# Patient Record
Sex: Female | Born: 1935 | Race: White | Hispanic: No | Smoking: Former smoker
Health system: Southern US, Community
[De-identification: ages and names within clinical notes are randomized; demographics above are authoritative.]

## PROBLEM LIST (undated history)

## (undated) DIAGNOSIS — R58 Hemorrhage, not elsewhere classified: Secondary | ICD-10-CM

## (undated) DIAGNOSIS — M4850XA Collapsed vertebra, not elsewhere classified, site unspecified, initial encounter for fracture: Secondary | ICD-10-CM

## (undated) DIAGNOSIS — J302 Other seasonal allergic rhinitis: Secondary | ICD-10-CM

## (undated) DIAGNOSIS — J45909 Unspecified asthma, uncomplicated: Secondary | ICD-10-CM

## (undated) DIAGNOSIS — R234 Changes in skin texture: Secondary | ICD-10-CM

## (undated) DIAGNOSIS — K589 Irritable bowel syndrome without diarrhea: Secondary | ICD-10-CM

## (undated) DIAGNOSIS — J449 Chronic obstructive pulmonary disease, unspecified: Secondary | ICD-10-CM

## (undated) DIAGNOSIS — I10 Essential (primary) hypertension: Secondary | ICD-10-CM

## (undated) DIAGNOSIS — D649 Anemia, unspecified: Secondary | ICD-10-CM

## (undated) DIAGNOSIS — R918 Other nonspecific abnormal finding of lung field: Secondary | ICD-10-CM

## (undated) DIAGNOSIS — E039 Hypothyroidism, unspecified: Secondary | ICD-10-CM

## (undated) DIAGNOSIS — Z9889 Other specified postprocedural states: Secondary | ICD-10-CM

## (undated) DIAGNOSIS — R350 Frequency of micturition: Secondary | ICD-10-CM

## (undated) DIAGNOSIS — G609 Hereditary and idiopathic neuropathy, unspecified: Secondary | ICD-10-CM

## (undated) DIAGNOSIS — G620 Drug-induced polyneuropathy: Secondary | ICD-10-CM

## (undated) DIAGNOSIS — Z853 Personal history of malignant neoplasm of breast: Secondary | ICD-10-CM

## (undated) DIAGNOSIS — M797 Fibromyalgia: Secondary | ICD-10-CM

## (undated) DIAGNOSIS — IMO0002 Reserved for concepts with insufficient information to code with codable children: Secondary | ICD-10-CM

## (undated) DIAGNOSIS — R569 Unspecified convulsions: Secondary | ICD-10-CM

## (undated) DIAGNOSIS — T451X5A Adverse effect of antineoplastic and immunosuppressive drugs, initial encounter: Secondary | ICD-10-CM

## (undated) DIAGNOSIS — E785 Hyperlipidemia, unspecified: Secondary | ICD-10-CM

## (undated) DIAGNOSIS — C50919 Malignant neoplasm of unspecified site of unspecified female breast: Secondary | ICD-10-CM

## (undated) DIAGNOSIS — R351 Nocturia: Secondary | ICD-10-CM

## (undated) DIAGNOSIS — E119 Type 2 diabetes mellitus without complications: Secondary | ICD-10-CM

## (undated) DIAGNOSIS — R112 Nausea with vomiting, unspecified: Secondary | ICD-10-CM

## (undated) DIAGNOSIS — Z86718 Personal history of other venous thrombosis and embolism: Secondary | ICD-10-CM

## (undated) DIAGNOSIS — I509 Heart failure, unspecified: Secondary | ICD-10-CM

## (undated) DIAGNOSIS — Z86711 Personal history of pulmonary embolism: Secondary | ICD-10-CM

## (undated) DIAGNOSIS — M810 Age-related osteoporosis without current pathological fracture: Secondary | ICD-10-CM

## (undated) HISTORY — PX: TOTAL ABDOMINAL HYSTERECTOMY W/ BILATERAL SALPINGOOPHORECTOMY: SHX83

## (undated) HISTORY — DX: Hereditary and idiopathic neuropathy, unspecified: G60.9

## (undated) HISTORY — PX: TONSILLECTOMY: SUR1361

## (undated) HISTORY — DX: Chronic obstructive pulmonary disease, unspecified: J44.9

## (undated) HISTORY — DX: Unspecified asthma, uncomplicated: J45.909

## (undated) HISTORY — DX: Irritable bowel syndrome, unspecified: K58.9

## (undated) HISTORY — PX: OTHER SURGICAL HISTORY: SHX169

## (undated) HISTORY — PX: COLONOSCOPY: SHX174

## (undated) HISTORY — PX: FEMUR FRACTURE SURGERY: SHX633

## (undated) HISTORY — DX: Fibromyalgia: M79.7

## (undated) HISTORY — DX: Heart failure, unspecified: I50.9

## (undated) HISTORY — PX: CHOLECYSTECTOMY: SHX55

## (undated) HISTORY — DX: Essential (primary) hypertension: I10

## (undated) HISTORY — DX: Malignant neoplasm of unspecified site of unspecified female breast: C50.919

## (undated) HISTORY — PX: CATARACT EXTRACTION W/ INTRAOCULAR LENS  IMPLANT, BILATERAL: SHX1307

## (undated) HISTORY — DX: Type 2 diabetes mellitus without complications: E11.9

## (undated) HISTORY — PX: APPENDECTOMY: SHX54

---

## 1994-03-01 HISTORY — PX: BREAST LUMPECTOMY: SHX2

## 1998-11-24 ENCOUNTER — Emergency Department (HOSPITAL_COMMUNITY): Admission: EM | Admit: 1998-11-24 | Discharge: 1998-11-24 | Payer: Self-pay | Admitting: Emergency Medicine

## 1998-11-24 ENCOUNTER — Encounter: Payer: Self-pay | Admitting: Emergency Medicine

## 2000-04-12 ENCOUNTER — Encounter: Admission: RE | Admit: 2000-04-12 | Discharge: 2000-04-12 | Payer: Self-pay | Admitting: Surgery

## 2000-04-12 ENCOUNTER — Encounter: Payer: Self-pay | Admitting: Surgery

## 2001-07-26 ENCOUNTER — Encounter: Admission: RE | Admit: 2001-07-26 | Discharge: 2001-07-26 | Payer: Self-pay | Admitting: Surgery

## 2001-07-26 ENCOUNTER — Encounter: Payer: Self-pay | Admitting: Surgery

## 2002-08-15 ENCOUNTER — Encounter: Payer: Self-pay | Admitting: Surgery

## 2002-08-15 ENCOUNTER — Encounter: Admission: RE | Admit: 2002-08-15 | Discharge: 2002-08-15 | Payer: Self-pay | Admitting: Surgery

## 2003-02-14 ENCOUNTER — Encounter (INDEPENDENT_AMBULATORY_CARE_PROVIDER_SITE_OTHER): Payer: Self-pay | Admitting: *Deleted

## 2003-02-14 ENCOUNTER — Ambulatory Visit (HOSPITAL_COMMUNITY): Admission: RE | Admit: 2003-02-14 | Discharge: 2003-02-14 | Payer: Self-pay | Admitting: Gastroenterology

## 2003-04-10 ENCOUNTER — Emergency Department (HOSPITAL_COMMUNITY): Admission: EM | Admit: 2003-04-10 | Discharge: 2003-04-10 | Payer: Self-pay | Admitting: Emergency Medicine

## 2003-07-11 ENCOUNTER — Ambulatory Visit (HOSPITAL_COMMUNITY): Admission: RE | Admit: 2003-07-11 | Discharge: 2003-07-11 | Payer: Self-pay | Admitting: Internal Medicine

## 2003-10-21 ENCOUNTER — Encounter: Admission: RE | Admit: 2003-10-21 | Discharge: 2003-10-21 | Payer: Self-pay | Admitting: Surgery

## 2003-11-14 ENCOUNTER — Emergency Department (HOSPITAL_COMMUNITY): Admission: EM | Admit: 2003-11-14 | Discharge: 2003-11-14 | Payer: Self-pay | Admitting: Emergency Medicine

## 2004-01-08 ENCOUNTER — Ambulatory Visit (HOSPITAL_COMMUNITY): Admission: RE | Admit: 2004-01-08 | Discharge: 2004-01-08 | Payer: Self-pay | Admitting: Neurological Surgery

## 2004-01-14 ENCOUNTER — Inpatient Hospital Stay (HOSPITAL_COMMUNITY): Admission: AD | Admit: 2004-01-14 | Discharge: 2004-01-16 | Payer: Self-pay | Admitting: Neurological Surgery

## 2004-01-14 HISTORY — PX: OTHER SURGICAL HISTORY: SHX169

## 2004-02-11 ENCOUNTER — Ambulatory Visit: Payer: Self-pay | Admitting: Cardiology

## 2004-02-21 ENCOUNTER — Ambulatory Visit: Payer: Self-pay

## 2004-03-10 ENCOUNTER — Ambulatory Visit: Payer: Self-pay | Admitting: Internal Medicine

## 2004-11-17 ENCOUNTER — Encounter: Admission: RE | Admit: 2004-11-17 | Discharge: 2004-11-17 | Payer: Self-pay | Admitting: Endocrinology

## 2005-01-26 ENCOUNTER — Other Ambulatory Visit: Admission: RE | Admit: 2005-01-26 | Discharge: 2005-01-26 | Payer: Self-pay | Admitting: Obstetrics and Gynecology

## 2005-03-04 ENCOUNTER — Ambulatory Visit: Payer: Self-pay | Admitting: Internal Medicine

## 2005-03-31 ENCOUNTER — Ambulatory Visit: Payer: Self-pay | Admitting: Internal Medicine

## 2005-06-08 HISTORY — PX: OTHER SURGICAL HISTORY: SHX169

## 2005-06-09 ENCOUNTER — Inpatient Hospital Stay (HOSPITAL_COMMUNITY): Admission: RE | Admit: 2005-06-09 | Discharge: 2005-06-10 | Payer: Self-pay | Admitting: Obstetrics and Gynecology

## 2005-10-08 ENCOUNTER — Encounter: Admission: RE | Admit: 2005-10-08 | Discharge: 2005-10-08 | Payer: Self-pay | Admitting: Gastroenterology

## 2005-11-18 ENCOUNTER — Encounter: Admission: RE | Admit: 2005-11-18 | Discharge: 2005-11-18 | Payer: Self-pay | Admitting: Endocrinology

## 2005-11-29 ENCOUNTER — Emergency Department (HOSPITAL_COMMUNITY): Admission: EM | Admit: 2005-11-29 | Discharge: 2005-11-30 | Payer: Self-pay | Admitting: Emergency Medicine

## 2006-12-01 ENCOUNTER — Encounter: Admission: RE | Admit: 2006-12-01 | Discharge: 2006-12-01 | Payer: Self-pay | Admitting: Endocrinology

## 2007-12-07 ENCOUNTER — Encounter: Admission: RE | Admit: 2007-12-07 | Discharge: 2007-12-07 | Payer: Self-pay | Admitting: Endocrinology

## 2008-12-09 ENCOUNTER — Encounter: Admission: RE | Admit: 2008-12-09 | Discharge: 2008-12-09 | Payer: Self-pay | Admitting: Endocrinology

## 2009-12-11 ENCOUNTER — Encounter: Admission: RE | Admit: 2009-12-11 | Discharge: 2009-12-11 | Payer: Self-pay | Admitting: Endocrinology

## 2009-12-19 ENCOUNTER — Encounter: Admission: RE | Admit: 2009-12-19 | Discharge: 2009-12-19 | Payer: Self-pay | Admitting: Endocrinology

## 2009-12-29 ENCOUNTER — Encounter: Admission: RE | Admit: 2009-12-29 | Discharge: 2009-12-29 | Payer: Self-pay | Admitting: Endocrinology

## 2010-01-01 ENCOUNTER — Ambulatory Visit: Payer: Self-pay | Admitting: Genetic Counselor

## 2010-01-01 ENCOUNTER — Ambulatory Visit: Payer: Self-pay | Admitting: Oncology

## 2010-01-05 ENCOUNTER — Encounter: Payer: Self-pay | Admitting: Pulmonary Disease

## 2010-01-05 LAB — CBC WITH DIFFERENTIAL/PLATELET
Basophils Absolute: 0 10*3/uL (ref 0.0–0.1)
EOS%: 1.7 % (ref 0.0–7.0)
HCT: 42 % (ref 34.8–46.6)
HGB: 14.3 g/dL (ref 11.6–15.9)
LYMPH%: 23.3 % (ref 14.0–49.7)
MCH: 31.6 pg (ref 25.1–34.0)
MCV: 93 fL (ref 79.5–101.0)
NEUT%: 67.4 % (ref 38.4–76.8)
Platelets: 203 10*3/uL (ref 145–400)
lymph#: 1.7 10*3/uL (ref 0.9–3.3)

## 2010-01-05 LAB — COMPREHENSIVE METABOLIC PANEL
AST: 19 U/L (ref 0–37)
BUN: 15 mg/dL (ref 6–23)
Calcium: 9.1 mg/dL (ref 8.4–10.5)
Chloride: 104 mEq/L (ref 96–112)
Creatinine, Ser: 0.72 mg/dL (ref 0.40–1.20)

## 2010-01-05 LAB — LACTATE DEHYDROGENASE: LDH: 195 U/L (ref 94–250)

## 2010-01-20 ENCOUNTER — Encounter: Payer: Self-pay | Admitting: Pulmonary Disease

## 2010-01-20 ENCOUNTER — Ambulatory Visit (HOSPITAL_COMMUNITY)
Admission: RE | Admit: 2010-01-20 | Discharge: 2010-01-20 | Payer: Self-pay | Source: Home / Self Care | Admitting: Oncology

## 2010-01-23 ENCOUNTER — Ambulatory Visit: Payer: Self-pay | Admitting: Pulmonary Disease

## 2010-01-23 DIAGNOSIS — J45909 Unspecified asthma, uncomplicated: Secondary | ICD-10-CM | POA: Insufficient documentation

## 2010-01-23 DIAGNOSIS — G4733 Obstructive sleep apnea (adult) (pediatric): Secondary | ICD-10-CM

## 2010-01-23 DIAGNOSIS — E785 Hyperlipidemia, unspecified: Secondary | ICD-10-CM | POA: Insufficient documentation

## 2010-01-23 DIAGNOSIS — R222 Localized swelling, mass and lump, trunk: Secondary | ICD-10-CM

## 2010-01-26 ENCOUNTER — Encounter: Payer: Self-pay | Admitting: Pulmonary Disease

## 2010-01-27 ENCOUNTER — Ambulatory Visit: Payer: Self-pay | Admitting: Cardiology

## 2010-01-29 ENCOUNTER — Ambulatory Visit (HOSPITAL_COMMUNITY)
Admission: RE | Admit: 2010-01-29 | Discharge: 2010-01-29 | Payer: Self-pay | Source: Home / Self Care | Admitting: Pulmonary Disease

## 2010-01-29 HISTORY — PX: BRONCHOSCOPY: SUR163

## 2010-01-30 ENCOUNTER — Telehealth: Payer: Self-pay | Admitting: Pulmonary Disease

## 2010-02-02 ENCOUNTER — Encounter: Payer: Self-pay | Admitting: Pulmonary Disease

## 2010-02-05 ENCOUNTER — Ambulatory Visit: Payer: Self-pay | Admitting: Genetic Counselor

## 2010-02-10 ENCOUNTER — Encounter: Payer: Self-pay | Admitting: Pulmonary Disease

## 2010-02-10 ENCOUNTER — Ambulatory Visit (HOSPITAL_COMMUNITY)
Admission: RE | Admit: 2010-02-10 | Discharge: 2010-02-10 | Payer: Self-pay | Source: Home / Self Care | Attending: Pulmonary Disease | Admitting: Pulmonary Disease

## 2010-02-11 ENCOUNTER — Ambulatory Visit: Payer: Self-pay | Admitting: Oncology

## 2010-02-17 ENCOUNTER — Encounter
Admission: RE | Admit: 2010-02-17 | Discharge: 2010-02-17 | Payer: Self-pay | Source: Home / Self Care | Attending: Surgery | Admitting: Surgery

## 2010-02-18 ENCOUNTER — Ambulatory Visit
Admission: RE | Admit: 2010-02-18 | Discharge: 2010-02-18 | Payer: Self-pay | Source: Home / Self Care | Attending: Surgery | Admitting: Surgery

## 2010-02-18 HISTORY — PX: OTHER SURGICAL HISTORY: SHX169

## 2010-02-25 ENCOUNTER — Encounter: Payer: Self-pay | Admitting: Oncology

## 2010-02-25 ENCOUNTER — Ambulatory Visit
Admission: RE | Admit: 2010-02-25 | Discharge: 2010-02-25 | Payer: Self-pay | Source: Home / Self Care | Attending: Oncology | Admitting: Oncology

## 2010-02-25 HISTORY — PX: TRANSTHORACIC ECHOCARDIOGRAM: SHX275

## 2010-03-01 HISTORY — PX: BREAST LUMPECTOMY: SHX2

## 2010-03-09 ENCOUNTER — Ambulatory Visit (HOSPITAL_COMMUNITY)
Admission: RE | Admit: 2010-03-09 | Discharge: 2010-03-09 | Payer: Self-pay | Source: Home / Self Care | Attending: Oncology | Admitting: Oncology

## 2010-03-11 ENCOUNTER — Ambulatory Visit
Admission: RE | Admit: 2010-03-11 | Discharge: 2010-03-11 | Payer: Self-pay | Source: Home / Self Care | Attending: Pulmonary Disease | Admitting: Pulmonary Disease

## 2010-03-12 ENCOUNTER — Encounter: Payer: Self-pay | Admitting: Cardiology

## 2010-03-13 ENCOUNTER — Ambulatory Visit: Payer: Self-pay | Admitting: Oncology

## 2010-03-18 ENCOUNTER — Encounter: Payer: Self-pay | Admitting: Cardiology

## 2010-03-18 ENCOUNTER — Ambulatory Visit: Admit: 2010-03-18 | Payer: Self-pay | Admitting: Pulmonary Disease

## 2010-03-18 LAB — BASIC METABOLIC PANEL
BUN: 18 mg/dL (ref 6–23)
CO2: 31 mEq/L (ref 19–32)
Calcium: 9.3 mg/dL (ref 8.4–10.5)
Chloride: 94 mEq/L — ABNORMAL LOW (ref 96–112)
Creatinine, Ser: 0.84 mg/dL (ref 0.40–1.20)
Glucose, Bld: 104 mg/dL — ABNORMAL HIGH (ref 70–99)
Potassium: 3.1 mEq/L — ABNORMAL LOW (ref 3.5–5.3)
Sodium: 136 mEq/L (ref 135–145)

## 2010-03-18 LAB — CBC WITH DIFFERENTIAL/PLATELET
BASO%: 0.9 % (ref 0.0–2.0)
Basophils Absolute: 0 10*3/uL (ref 0.0–0.1)
EOS%: 2.2 % (ref 0.0–7.0)
Eosinophils Absolute: 0 10*3/uL (ref 0.0–0.5)
HCT: 41.4 % (ref 34.8–46.6)
HGB: 14.3 g/dL (ref 11.6–15.9)
LYMPH%: 83.5 % — ABNORMAL HIGH (ref 14.0–49.7)
MCH: 31.4 pg (ref 25.1–34.0)
MCHC: 34.6 g/dL (ref 31.5–36.0)
MCV: 90.9 fL (ref 79.5–101.0)
MONO#: 0 10*3/uL — ABNORMAL LOW (ref 0.1–0.9)
MONO%: 2.1 % (ref 0.0–14.0)
NEUT#: 0.1 10*3/uL — CL (ref 1.5–6.5)
NEUT%: 11.3 % — ABNORMAL LOW (ref 38.4–76.8)
Platelets: 115 10*3/uL — ABNORMAL LOW (ref 145–400)
RBC: 4.55 10*6/uL (ref 3.70–5.45)
RDW: 12.2 % (ref 11.2–14.5)
WBC: 0.7 10*3/uL — CL (ref 3.9–10.3)
lymph#: 0.6 10*3/uL — ABNORMAL LOW (ref 0.9–3.3)

## 2010-03-19 DIAGNOSIS — R05 Cough: Secondary | ICD-10-CM | POA: Insufficient documentation

## 2010-03-25 ENCOUNTER — Ambulatory Visit
Admission: RE | Admit: 2010-03-25 | Discharge: 2010-03-25 | Payer: Self-pay | Source: Home / Self Care | Attending: Pulmonary Disease | Admitting: Pulmonary Disease

## 2010-03-25 ENCOUNTER — Encounter: Payer: Self-pay | Admitting: Pulmonary Disease

## 2010-03-26 ENCOUNTER — Encounter: Payer: Self-pay | Admitting: Cardiology

## 2010-03-26 LAB — CBC WITH DIFFERENTIAL/PLATELET
BASO%: 0.5 % (ref 0.0–2.0)
LYMPH%: 16.8 % (ref 14.0–49.7)
MCH: 30.7 pg (ref 25.1–34.0)
MCHC: 35.1 g/dL (ref 31.5–36.0)
MCV: 87.4 fL (ref 79.5–101.0)
MONO%: 12.1 % (ref 0.0–14.0)
Platelets: 187 10*3/uL (ref 145–400)
RBC: 4.76 10*6/uL (ref 3.70–5.45)
nRBC: 0 % (ref 0–0)

## 2010-03-26 LAB — COMPREHENSIVE METABOLIC PANEL
ALT: 19 U/L (ref 0–35)
AST: 24 U/L (ref 0–37)
Creatinine, Ser: 0.79 mg/dL (ref 0.40–1.20)
Total Bilirubin: 0.3 mg/dL (ref 0.3–1.2)

## 2010-03-31 NOTE — Progress Notes (Signed)
Summary: pain in chest, prod cough   Phone Note Call from Patient Call back at Home Phone [phone]   Caller: Patient Call For: [facility] Reason for Call: Talk to Nurse Summary of Call: Patient had a bronchoscopy yesterday.  Patient calling saying she has pain in her chest and throat is sore.  She is asking for a light pain med, she has been taking tylenol with no relief.  [facility] Initial call taken by: Lehman Prom,  January 30, 2010 10:26 AM [facility]er: [facility]  [phone]*  Follow-up for Phone Call        Spoke with pt and she states she had a bronch yesterday and has been using tylenol for pain but it is not helping. She states her chest is sore. She denies any increased SOB, no hemoptysis, just soreness. Pt also states her throat is sore as well. Pt is requesting an rx for the pain . Please advsie.Carron Curie CMA  January 30, 2010 11:37 AM   Additional Follow-up for Phone Call Additional follow up Details #1::        pt did not have ptx on cxr  let her know that it is common to have a sore throat afterwards, but not necessarily a sore chest. as long as she isn't having sob, would use advil 200mg  2-3 up to every 8 hrs if needed for pain.  Let her know her biopsies are still pending.  Likely to be ready monday Additional Follow-up by: Barbaraann Share MD,  January 30, 2010 1:43 PM    Additional Follow-up for Phone Call Additional follow up Details #2::    Called, spoke with pt.  She was informed of above per [facility] and verbalized understanding.  She would also like me to inform KC that the soreness in chest is more like a pain and is worse when coughing and upon inspiration.  Also states starting coughing up mucus today.  Mucus is yellow with a small amount of dark red streaks.  Pt denies SOB and fever.  Will forward to [facility].   Gweneth Dimitri RN  January 30, 2010 2:24 PM   Additional Follow-up for Phone Call Additional follow up Details #3:: Details  for Additional Follow-up Action Taken: not uncommon to have streaks of blood after doing biopsy.  can call in levaquin 750mg  one each day for 5 days.  Use advil as directed for discomfort.  If her symptoms continue, will need to have followup cxr.  If she gets sob, will need to have cxr then. Additional Follow-up by: Barbaraann Share MD,  January 30, 2010 3:25 PM  New/Updated Medications: LEVAQUIN 750 MG TABS (LEVOFLOXACIN) Take 1 tablet by mouth once a day Prescriptions: LEVAQUIN 750 MG TABS (LEVOFLOXACIN) Take 1 tablet by mouth once a day  #5 x 0   Entered by:   Carron Curie CMA   Authorized by:   Barbaraann Share MD   Signed by:   Carron Curie CMA on 01/30/2010   Method used:   [facility]        [phone] (retail)       [address] [place], [place]  [address]       Ph: [phone] or [phone]       Fax: [phone]   RxID:   [id number]  rx sent. pt aware of recs.Carron Curie CMA  January 30, 2010 3:34  PM

## 2010-03-31 NOTE — Assessment & Plan Note (Signed)
Summary: consult for abnormal ct chest   Visit Type:  Initial Consult Copy to:  Pierce Crane MD Primary Provider/Referring Provider:  Laurene Footman MD  CC:  Pulmonary consult. pt here to evaluate lung mass.  History of Present Illness: The pt is a very pleasant 76y/o female who I have been asked to see for an abnormal chest ct.  She was diagnosed with right breast cancer this month, and ct chest revealed small LN except for a 13mm right hilar node, and multiple GGO's with the largest being 3cm in the RLL.  She subsequently underwent PET scanning, and this showed the right hilar LN to have minimal hypermetabolic activity, but the GGO in the RLL had moderate uptake at 5.4 SUV.  The pt has a chronic cough that is primarily dry, but can bring up clear mucus first thing in the am.  She denies any purulent mucus, or history suggestive of recent pulmonary infection.  She reports no chest pain or hemoptysis, but did have a "speck" of blood in her mucus in Mar of this year.  The pt has been eating well, and denies any weight loss.  She does have a h/o smoking 1ppd for 30+ years, but has not smoked in 54yrs.    Preventive Screening-Counseling & Management  Alcohol-Tobacco     Smoking Status: quit  Current Medications (verified): 1)  Vytorin 10-80 Mg Tabs (Ezetimibe-Simvastatin) .... Once Daily 2)  Fluoxetine Hcl 40 Mg Caps (Fluoxetine Hcl) .... Once Daily 3)  Hydrochlorothiazide 25 Mg Tabs (Hydrochlorothiazide) .... Once Daily 4)  Synthroid 88 Mcg Tabs (Levothyroxine Sodium) .... Once Daily 5)  Vitamin D (Ergocalciferol) 50000 Unit Caps (Ergocalciferol) .... Once A Week 6)  Zolpidem Tartrate 10 Mg Tabs (Zolpidem Tartrate) .... Once Daily 7)  Fosamax Plus D 70-2800 Mg-Unit Tabs (Alendronate-Cholecalciferol) .... Once A Week 8)  Dexilant 60 Mg Cpdr (Dexlansoprazole) .... Once Daily 9)  Colestipol ( ? Strength) .Marland Kitchen.. 1 Two Times A Day  Allergies (verified): 1)  ! Pcn  Past History:  Past Medical  History: IBS fibromyalgia left breast cancer 1996....lumpectomy, xrt right breast cancer dxed 12/2009. OBSTRUCTIVE SLEEP APNEA (ICD-327.23) HYPERLIPIDEMIA (ICD-272.4) ASTHMA (ICD-493.90)    Past Surgical History: hysterectomy oophorectomy cholecystectomy appendectomy back surgery left breast lumpectomy  Family History: Reviewed history and no changes required. emphysema--father asthma--father ovarian cancer--mother, sister, maternal aunt breast cancer: mgm  Social History: Reviewed history and no changes required. 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 started age 76. quit 1980's.  Married Smoking Status:  quit  Review of Systems       The patient complains of shortness of breath with activity, shortness of breath at rest, acid heartburn, indigestion, anxiety, depression, hand/feet swelling, and joint stiffness or pain.  The patient denies productive cough, non-productive cough, coughing up blood, chest pain, irregular heartbeats, loss of appetite, weight change, abdominal pain, difficulty swallowing, sore throat, tooth/dental problems, headaches, nasal congestion/difficulty breathing through nose, sneezing, itching, ear ache, rash, change in color of mucus, and fever.    Vital Signs:  Patient profile:   76 year old female Height:      67 inches Weight:      180.38 pounds BMI:     28.35 O2 Sat:      96 % on Room air Temp:     98.4 degrees F oral Pulse rate:   69 / minute BP sitting:   154 / 84  (right arm) Cuff size:   large  Vitals Entered By: Hali Marry  Silva (January 23, 2010 10:22 AM)  O2 Flow:  Room air CC: Pulmonary consult. pt here to evaluate lung mass Comments meds and allergies updated Phone number updated Carver Fila  January 23, 2010 10:23 AM    Physical Exam  General:  ow female in nad Eyes:  PERRLA and EOMI.   Nose:  patent without discharge Mouth:  clear, no exudates or lesions seen. Neck:  no jvd, tmg, LN Lungs:   clear to auscultation, no wheezing or rhonchi Heart:  rrr, no mrg Abdomen:  soft and nontender, bs+ Extremities:  no edema or cyanosis  pulses intact distally Neurologic:  alert and oriented, moves all 4.   Impression & Recommendations:  Problem # 1:  MASS, LUNG (ICD-786.6) the pt has multiple GGO's bilat on ct chest, with the largest being in the RLL and associated with moderate hypermetabolic activity on PET.  I think this is very unlikely to be infectious with no history to support this, although I cannot r/o something such as MAC.  It could be inflammatory, but the appearance of the dominant lesion in the RLL is concerning.  Her treatment course may be significantly altered by the findings (met. breast vs BAC?).  After a long discussion with the pt and her family, the decision was made to pursue a diagnosis.  The lesion is deep in the lung, and needle biopsy would likely result in a ptx.  I think this can be approached safely with ENB, and have discussed the procedure with the patient.  She is agreeable.  Medications Added to Medication List This Visit: 1)  Vytorin 10-80 Mg Tabs (Ezetimibe-simvastatin) .... Once daily 2)  Fluoxetine Hcl 40 Mg Caps (Fluoxetine hcl) .... Once daily 3)  Hydrochlorothiazide 25 Mg Tabs (Hydrochlorothiazide) .... Once daily 4)  Synthroid 88 Mcg Tabs (Levothyroxine sodium) .... Once daily 5)  Vitamin D (ergocalciferol) 50000 Unit Caps (Ergocalciferol) .... Once a week 6)  Zolpidem Tartrate 10 Mg Tabs (Zolpidem tartrate) .... Once daily 7)  Fosamax Plus D 70-2800 Mg-unit Tabs (Alendronate-cholecalciferol) .... Once a week 8)  Dexilant 60 Mg Cpdr (Dexlansoprazole) .... Once daily 9)  Colestipol ( ? Strength)  .Marland Kitchen.. 1 two times a day  Other Orders: Consultation Level IV (16109)  Patient Instructions: 1)  will schedule for special bronchoscopy to biopsy this area and possibly your lymph nodes.  Will call you once this is done.    Immunization  History:  Influenza Immunization History:    Influenza:  historical (10/30/2009)

## 2010-03-31 NOTE — Miscellaneous (Signed)
Summary: Orders Update   Clinical Lists Changes  Orders: Added new Referral order of Radiology Referral (Radiology) - Signed 

## 2010-03-31 NOTE — Miscellaneous (Signed)
Summary: Orders Update  Clinical Lists Changes  Orders: Added new Referral order of Radiology Referral (Radiology) - Signed Added new Referral order of Misc. Referral (Misc. Ref) - Signed 

## 2010-04-01 ENCOUNTER — Inpatient Hospital Stay (HOSPITAL_COMMUNITY)
Admission: EM | Admit: 2010-04-01 | Discharge: 2010-04-08 | DRG: 312 | Disposition: A | Discharge status: Home / Self Care | Payer: Medicare Other | Attending: Internal Medicine | Admitting: Internal Medicine

## 2010-04-01 ENCOUNTER — Emergency Department (HOSPITAL_COMMUNITY): Payer: Medicare Other

## 2010-04-01 DIAGNOSIS — J4489 Other specified chronic obstructive pulmonary disease: Secondary | ICD-10-CM | POA: Diagnosis present

## 2010-04-01 DIAGNOSIS — R5081 Fever presenting with conditions classified elsewhere: Secondary | ICD-10-CM | POA: Diagnosis present

## 2010-04-01 DIAGNOSIS — IMO0001 Reserved for inherently not codable concepts without codable children: Secondary | ICD-10-CM | POA: Diagnosis present

## 2010-04-01 DIAGNOSIS — T451X5A Adverse effect of antineoplastic and immunosuppressive drugs, initial encounter: Secondary | ICD-10-CM | POA: Diagnosis present

## 2010-04-01 DIAGNOSIS — K121 Other forms of stomatitis: Secondary | ICD-10-CM | POA: Diagnosis present

## 2010-04-01 DIAGNOSIS — J449 Chronic obstructive pulmonary disease, unspecified: Secondary | ICD-10-CM | POA: Diagnosis present

## 2010-04-01 DIAGNOSIS — G4733 Obstructive sleep apnea (adult) (pediatric): Secondary | ICD-10-CM | POA: Diagnosis present

## 2010-04-01 DIAGNOSIS — R197 Diarrhea, unspecified: Secondary | ICD-10-CM | POA: Diagnosis present

## 2010-04-01 DIAGNOSIS — D709 Neutropenia, unspecified: Secondary | ICD-10-CM | POA: Diagnosis present

## 2010-04-01 DIAGNOSIS — I951 Orthostatic hypotension: Principal | ICD-10-CM | POA: Diagnosis present

## 2010-04-01 DIAGNOSIS — E039 Hypothyroidism, unspecified: Secondary | ICD-10-CM | POA: Diagnosis present

## 2010-04-01 DIAGNOSIS — D6959 Other secondary thrombocytopenia: Secondary | ICD-10-CM | POA: Diagnosis present

## 2010-04-01 DIAGNOSIS — A088 Other specified intestinal infections: Secondary | ICD-10-CM | POA: Diagnosis present

## 2010-04-01 DIAGNOSIS — E876 Hypokalemia: Secondary | ICD-10-CM | POA: Diagnosis present

## 2010-04-01 DIAGNOSIS — C50919 Malignant neoplasm of unspecified site of unspecified female breast: Secondary | ICD-10-CM | POA: Diagnosis present

## 2010-04-01 LAB — DIFFERENTIAL
Band Neutrophils: 0 % (ref 0–10)
Basophils Absolute: 0 10*3/uL (ref 0.0–0.1)
Eosinophils Relative: 0 % (ref 0–5)
Lymphocytes Relative: 0 % — ABNORMAL LOW (ref 12–46)
Monocytes Relative: 0 % — ABNORMAL LOW (ref 3–12)
nRBC: 0 /100 WBC

## 2010-04-01 LAB — COMPREHENSIVE METABOLIC PANEL
ALT: 58 U/L — ABNORMAL HIGH (ref 0–35)
AST: 37 U/L (ref 0–37)
Alkaline Phosphatase: 75 U/L (ref 39–117)
CO2: 28 mEq/L (ref 19–32)
Calcium: 9.7 mg/dL (ref 8.4–10.5)
Chloride: 95 mEq/L — ABNORMAL LOW (ref 96–112)
GFR calc non Af Amer: 60 mL/min — ABNORMAL LOW (ref >60)
Glucose, Bld: 110 mg/dL — ABNORMAL HIGH (ref 70–99)
Potassium: 4.2 mEq/L (ref 3.5–5.1)
Sodium: 134 mEq/L — ABNORMAL LOW (ref 135–145)
Total Bilirubin: 0.9 mg/dL (ref 0.3–1.2)

## 2010-04-01 LAB — URINALYSIS, ROUTINE W REFLEX MICROSCOPIC
Bilirubin Urine: NEGATIVE
Ketones, ur: NEGATIVE mg/dL
Nitrite: NEGATIVE
Protein, ur: NEGATIVE mg/dL

## 2010-04-01 LAB — POCT CARDIAC MARKERS: Troponin i, poc: <0.05 ng/mL (ref 0.00–0.09)

## 2010-04-01 LAB — CBC
MCH: 30.2 pg (ref 26.0–34.0)
Platelets: 160 10*3/uL (ref 150–400)
RBC: 4.63 MIL/uL (ref 3.87–5.11)
RDW: 12.2 % (ref 11.5–15.5)

## 2010-04-02 DIAGNOSIS — C50919 Malignant neoplasm of unspecified site of unspecified female breast: Secondary | ICD-10-CM

## 2010-04-02 LAB — CBC
MCH: 30.6 pg (ref 26.0–34.0)
MCV: 87.5 fL (ref 78.0–100.0)
Platelets: 124 10*3/uL — ABNORMAL LOW (ref 150–400)
RBC: 3.92 MIL/uL (ref 3.87–5.11)
RDW: 12.4 % (ref 11.5–15.5)
WBC: 0.5 10*3/uL — CL (ref 4.0–10.5)

## 2010-04-02 LAB — COMPREHENSIVE METABOLIC PANEL
ALT: 42 U/L — ABNORMAL HIGH (ref 0–35)
Albumin: 2.8 g/dL — ABNORMAL LOW (ref 3.5–5.2)
Alkaline Phosphatase: 60 U/L (ref 39–117)
BUN: 15 mg/dL (ref 6–23)
Chloride: 99 mEq/L (ref 96–112)
Glucose, Bld: 88 mg/dL (ref 70–99)
Potassium: 3.1 mEq/L — ABNORMAL LOW (ref 3.5–5.1)
Sodium: 136 mEq/L (ref 135–145)
Total Bilirubin: 0.6 mg/dL (ref 0.3–1.2)

## 2010-04-02 LAB — PATHOLOGIST SMEAR REVIEW

## 2010-04-02 NOTE — Assessment & Plan Note (Signed)
Summary: acute sick visit for cough   Copy to:  Pierce Crane MD Primary Provider/Referring Provider:  Laurene Footman MD  CC:  Acute visit. pt c/o dry cough and wheezing x couple months. pt states she had a nose bleed this morning and spit up a clot of blood.  History of Present Illness: the pt comes in today for an acute sick visit related to a cough.  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  This has been reviewed by Dr. Donnie Coffin, and he is now following.  She currently c/o a dry hacking cough and upper airway pseudowheezing for the last few months.  She has had postnasal drip, and most recently epistaxis.  She has seen this am scant mucus on coughing with small clot of blood.  She denies chest congestion or purulence.  She is not more sob than usual.  She denies reflux symptoms.  Current Medications (verified): 1)  Vytorin 10-80 Mg Tabs (Ezetimibe-Simvastatin) .... Once Daily 2)  Fluoxetine Hcl 40 Mg Caps (Fluoxetine Hcl) .... Once Daily 3)  Hydrochlorothiazide 25 Mg Tabs (Hydrochlorothiazide) .... Once Daily 4)  Synthroid 88 Mcg Tabs (Levothyroxine Sodium) .... Once Daily 5)  Vitamin D (Ergocalciferol) 50000 Unit Caps (Ergocalciferol) .... Once A Week 6)  Zolpidem Tartrate 10 Mg Tabs (Zolpidem Tartrate) .... Once Daily As Needed 7)  Fosamax Plus D 70-2800 Mg-Unit Tabs (Alendronate-Cholecalciferol) .... Once A Week 8)  Dexilant 60 Mg Cpdr (Dexlansoprazole) .... 2 Times A Day 9)  Colestipol Hcl 1 Gm Tabs (Colestipol Hcl) .Marland Kitchen.. 1 Two Times A Day 10)  Levaquin 500 Mg Tabs (Levofloxacin) .... Once Daily X7 Currently On 3rd Day By Dr. Donnie Coffin  Allergies (verified): 1)  ! Pcn  Past History:  Past medical, surgical, family and social histories (including risk factors) reviewed, and no changes noted (except as noted below).  Past Medical History: Reviewed history from 01/23/2010 and no changes  required. IBS fibromyalgia left breast cancer 1996....lumpectomy, xrt right breast cancer dxed 12/2009. OBSTRUCTIVE SLEEP APNEA (ICD-327.23) HYPERLIPIDEMIA (ICD-272.4) ASTHMA (ICD-493.90)    Past Surgical History: Reviewed history from 01/23/2010 and no changes required. hysterectomy oophorectomy cholecystectomy appendectomy back surgery left breast lumpectomy  Family History: Reviewed history from 01/23/2010 and no changes required. emphysema--father asthma--father ovarian cancer--mother, sister, maternal aunt breast cancer: mgm  Social History: Reviewed history from 01/23/2010 and no changes required. 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 started age 16. quit 1980's.  Married  Review of Systems       The patient complains of shortness of breath with activity, non-productive cough, coughing up blood, indigestion, and nasal congestion/difficulty breathing through nose.  The patient denies shortness of breath at rest, productive cough, chest pain, irregular heartbeats, acid heartburn, loss of appetite, weight change, abdominal pain, difficulty swallowing, sore throat, tooth/dental problems, headaches, sneezing, itching, ear ache, anxiety, depression, hand/feet swelling, joint stiffness or pain, rash, change in color of mucus, and fever.    Vital Signs:  Patient profile:   76 year old female Height:      67 inches Weight:      173.13 pounds BMI:     27.21 O2 Sat:      96 % on Room air Temp:     98 degrees F oral Pulse rate:   85 / minute BP sitting:   150 / 78  (left arm) Cuff size:   large  Vitals Entered By: Carver Fila (March 11, 2010 8:58 AM)  O2 Flow:  Room air CC: Acute visit. pt c/o dry cough, wheezing x couple months. pt states she had a nose bleed this morning and spit up a clot of blood Comments meds and allergies updated Phone number updated Carver Fila  March 11, 2010 8:58 AM    Physical Exam  General:  ow female in  nad Nose:  mild heme staining, no purulence noted Mouth:  no lesions or bleeding source noted. Lungs:  totally clear to auscultation Heart:  rrr, no mrg Extremities:  no significant edema or cyanosis  Neurologic:  alert and oriented, moves all 4.   Impression & Recommendations:  Problem # 1:  COUGH (ICD-786.2)  the pt's description of her cough is more c/w an upper airway source, although I cannot exclude a lower airway source with everything going on.  The most likely culprits are postnasal drip and reflux, and she does have epistaxis with nasal mucosal changes.  I am also concerned about a contribution from LPR and a cyclical cough mechanism.  Will work on nasal hygiene, behavioral therapies for cyclical coughing, and more aggressive treatment of reflux.  If she continues to have issues, will recheck a cxr.    Medications Added to Medication List This Visit: 1)  Zolpidem Tartrate 10 Mg Tabs (Zolpidem tartrate) .... Once daily as needed 2)  Dexilant 60 Mg Cpdr (Dexlansoprazole) .... 2 times a day 3)  Colestipol Hcl 1 Gm Tabs (Colestipol hcl) .Marland Kitchen.. 1 two times a day 4)  Levaquin 500 Mg Tabs (Levofloxacin) .... Once daily x7 currently on 3rd day by dr. Donnie Coffin 5)  Dexilant 60 Mg Cpdr (Dexlansoprazole) .... Take one by mouth am and pm 6)  Tessalon Perles 100 Mg Caps (Benzonatate) .... Two by mouth every 4-6hrs if needed.  Other Orders: Est. Patient Level IV (16109)  Patient Instructions: 1)  NO throat clearing, and minimize voice use. 2)  chlorpheniramine 8mg  one at bedtime and lunch everyday for next one week 3)  neilmed sinus rinses am and pm for next one week 4)  hard candy to bathe back of your throat all day until cough is better. 5)  add pepcid otc to your dexilant at bedtime. 6)  stop fosamax until your cough is much better. 7)  tessalon pearls 2 every 6 hrs if needed for cough 8)  followup with me in 2 weeks.  Prescriptions: TESSALON PERLES 100 MG  CAPS (BENZONATATE) two by  mouth every 4-6hrs if needed.  #30 x 1   Entered and Authorized by:   Barbaraann Share MD   Signed by:   Barbaraann Share MD on 03/11/2010   Method used:   Print then Give to Patient   RxID:   [id number] DEXILANT 60 MG CPDR (DEXLANSOPRAZOLE) Take one by mouth am and pm  #60 x 6   Entered and Authorized by:   Barbaraann Share MD   Signed by:   Barbaraann Share MD on 03/11/2010   Method used:   Print then Give to Patient   RxID:   [id number]

## 2010-04-02 NOTE — Assessment & Plan Note (Signed)
Summary: rov for cough   Copy to:  Pierce Crane MD Primary Provider/Referring Provider:  Laurene Footman MD  CC:  2 week f/u appt.  Pt states cough was almost 100% resolved last week but states it has returned this week to about 50%.  Pt states she did sinus rinses and Chlorpheniramine for 1 week.  States she saw Dr. Donnie Coffin recently and is currently on abx. .  History of Present Illness: The pt comes in today for f/u of her chronic cough.  At the last visit, she was felt to have more of an upper airway issue than lower.  She was placed on the cyclical cough protocol, and asked to f/u in 2 weeks.  She was also treated more aggressively for reflux and postnasal drip, and feel these have helped significantly.  Her cough resolved 100% after the cyclical cough protocol, then began to re-escalate over the last one week.  She is trying to stick with some of the behavioral techniques, but is not taking tessalon pearls or using hard candy to prevent throat clearing.  She has a h/o "asthma", and takes an inhaler as needed.  Current Medications (verified): 1)  Vytorin 10-80 Mg Tabs (Ezetimibe-Simvastatin) .... Once Daily 2)  Fluoxetine Hcl 40 Mg Caps (Fluoxetine Hcl) .... Once Daily 3)  Hydrochlorothiazide 25 Mg Tabs (Hydrochlorothiazide) .... Once Daily 4)  Synthroid 88 Mcg Tabs (Levothyroxine Sodium) .... Once Daily 5)  Vitamin D (Ergocalciferol) 50000 Unit Caps (Ergocalciferol) .... Once A Week 6)  Zolpidem Tartrate 10 Mg Tabs (Zolpidem Tartrate) .... Once Daily As Needed 7)  Colestipol Hcl 1 Gm Tabs (Colestipol Hcl) .Marland Kitchen.. 1 Two Times A Day 8)  Levaquin 500 Mg Tabs (Levofloxacin) .... Take 1 Tablet By Mouth Once A Day Per Dr. Donnie Coffin 9)  Dexilant 60 Mg Cpdr (Dexlansoprazole) .... Take One By Mouth Am and Pm 10)  Pepcid Ac 10 Mg Tabs (Famotidine) .... Take 1 Tab By Mouth At Bedtime  Allergies (verified): 1)  ! Pcn  Past History:  Past medical, surgical, family and social histories (including risk factors)  reviewed, and no changes noted (except as noted below).  Past Medical History: IBS fibromyalgia left breast cancer 1996....lumpectomy, xrt right breast cancer dxed 12/2009. OBSTRUCTIVE SLEEP APNEA (ICD-327.23) HYPERLIPIDEMIA (ICD-272.4) pulmonary nodules....ENB nondiagnostic, TTNA 2011 showed lymphoid aggregates with germinal center                                     formation.  Being followed by Donnie Coffin.   Chronic cough...felt to be upper airway in origin.  Normal spirometry 2012     Past Surgical History: Reviewed history from 01/23/2010 and no changes required. hysterectomy oophorectomy cholecystectomy appendectomy back surgery left breast lumpectomy  Family History: Reviewed history from 01/23/2010 and no changes required. emphysema--father asthma--father ovarian cancer--mother, sister, maternal aunt breast cancer: mgm  Social History: Reviewed history from 01/23/2010 and no changes required. 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  Married  Review of Systems       The patient complains of shortness of breath with activity, non-productive cough, indigestion, loss of appetite, weight change, tooth/dental problems, anxiety, depression, and joint stiffness or pain.  The patient denies shortness of breath at rest, productive cough, coughing up blood, chest pain, irregular heartbeats, acid heartburn, abdominal pain, difficulty swallowing, sore throat, headaches, nasal congestion/difficulty breathing through nose, sneezing, itching, ear ache, hand/feet  swelling, rash, change in color of mucus, and fever.    Vital Signs:  Patient profile:   76 year old female Height:      67 inches Weight:      165.38 pounds BMI:     26.00 O2 Sat:      96 % on Room air Temp:     98.3 degrees F oral Pulse rate:   84 / minute BP sitting:   126 / 68  (right arm) Cuff size:   regular  Vitals Entered By: Arman Filter LPN (March 25, 2010  10:09 AM)  O2 Flow:  Room air CC: 2 week f/u appt.  Pt states cough was almost 100% resolved last week but states it has returned this week to about 50%.  Pt states she did sinus rinses and Chlorpheniramine for 1 week.  States she saw Dr. Donnie Coffin recently and is currently on abx.  Comments Medications reviewed with patient Arman Filter LPN  March 25, 2010 10:11 AM    Physical Exam  General:  wd female in nad Nose:  no purulence or discharge noted. Lungs:  totally clear to auscultation Heart:  rrr Extremities:  minimal edema, no cyanosis  Neurologic:  alert and oriented, moves all 4.   Impression & Recommendations:  Problem # 1:  COUGH (ICD-786.2)  the pt's cough continues to sound more upper airway in origin.  She had 100% resolution of her cough on the cyclical cough protocol, but has re-escalated to a 5/10 since being off of this.  However, she did increase her voice use and quit using her hard candy consistently with increase in her throat clearing.  Her spirometry today is totally normal, therefore she is unlikely to have cough variant asthma and has no copd.  I cannot 100% exclude that her lung process could be contributing to this, but I think it is unlikely.  I have asked her to get back on the tessalon pearls during the day, continue with behavioral techniques as discussed, and to stop any and all inhalers.  She will followup with me in 4 weeks, but call if cough is not improving.  Medications Added to Medication List This Visit: 1)  Levaquin 500 Mg Tabs (Levofloxacin) .... Take 1 tablet by mouth once a day per dr. Donnie Coffin 2)  Pepcid Ac 10 Mg Tabs (Famotidine) .... Take 1 tab by mouth at bedtime  Other Orders: Est. Patient Level IV (40981)  Patient Instructions: 1)  continue with voice rest, keep hard candy in mouth during waking hours, continue with aggressive reflux meds 2)  restart tessalon pearls as directed.   3)  no throat clearing. 4)  stop all inhaled  medications 5)  followup with me in 4 weeks.  Appended Document: rov for cough megan, I forgot to tell her to stay on chlorpheniramine at bedtime as well.  Please let her know.  thanks  Appended Document: Orders Update    Clinical Lists Changes  Orders: Added new Service order of Spirometry w/Graph (94010) - Signed      Appended Document: rov for cough called and spoke with pt's husband.  informed him to have pt stay on Chlorpheniramine 8mg  at bedtime.  husband verbalized understanding and will relay message to pt.

## 2010-04-03 LAB — CBC
HCT: 32.2 % — ABNORMAL LOW (ref 36.0–46.0)
MCH: 29.9 pg (ref 26.0–34.0)
MCHC: 34.2 g/dL (ref 30.0–36.0)
MCV: 87.5 fL (ref 78.0–100.0)
Platelets: 98 10*3/uL — ABNORMAL LOW (ref 150–400)
RDW: 12.3 % (ref 11.5–15.5)
WBC: <0.4 10*3/uL — CL (ref 4.0–10.5)

## 2010-04-03 LAB — DIFFERENTIAL
Band Neutrophils: 0 % (ref 0–10)
Basophils Absolute: 0 10*3/uL (ref 0.0–0.1)
Eosinophils Absolute: 0 10*3/uL (ref 0.0–0.7)
Monocytes Absolute: 0 10*3/uL — ABNORMAL LOW (ref 0.1–1.0)
Monocytes Relative: 0 % — ABNORMAL LOW (ref 3–12)
nRBC: 0 /100 WBC

## 2010-04-03 LAB — CLOSTRIDIUM DIFFICILE BY PCR: Toxigenic C. Difficile by PCR: NEGATIVE

## 2010-04-03 LAB — BASIC METABOLIC PANEL
BUN: 7 mg/dL (ref 6–23)
Calcium: 7.8 mg/dL — ABNORMAL LOW (ref 8.4–10.5)
Creatinine, Ser: 0.58 mg/dL (ref 0.4–1.2)
GFR calc non Af Amer: >60 mL/min (ref >60)
Glucose, Bld: 96 mg/dL (ref 70–99)

## 2010-04-03 NOTE — Letter (Signed)
Summary: [facility]  [facility]   Imported By: Lester  01/30/2010 09:02:50  _____________________________________________________________________  External Attachment:    Type:   Image     Comment:   External Document

## 2010-04-04 DIAGNOSIS — R5081 Fever presenting with conditions classified elsewhere: Secondary | ICD-10-CM

## 2010-04-04 DIAGNOSIS — C50919 Malignant neoplasm of unspecified site of unspecified female breast: Secondary | ICD-10-CM

## 2010-04-04 DIAGNOSIS — D709 Neutropenia, unspecified: Secondary | ICD-10-CM

## 2010-04-04 LAB — BASIC METABOLIC PANEL
BUN: 5 mg/dL — ABNORMAL LOW (ref 6–23)
Calcium: 7.9 mg/dL — ABNORMAL LOW (ref 8.4–10.5)
Creatinine, Ser: 0.63 mg/dL (ref 0.4–1.2)
GFR calc non Af Amer: >60 mL/min (ref >60)
Glucose, Bld: 100 mg/dL — ABNORMAL HIGH (ref 70–99)
Potassium: 3.8 mEq/L (ref 3.5–5.1)

## 2010-04-04 LAB — DIFFERENTIAL
Band Neutrophils: 0 % (ref 0–10)
Eosinophils Absolute: 0 10*3/uL (ref 0.0–0.7)
Lymphs Abs: 0 10*3/uL — ABNORMAL LOW (ref 0.7–4.0)
Monocytes Relative: 0 % — ABNORMAL LOW (ref 3–12)
nRBC: 0 /100 WBC

## 2010-04-04 LAB — CBC
Hemoglobin: 10.6 g/dL — ABNORMAL LOW (ref 12.0–15.0)
MCH: 30.5 pg (ref 26.0–34.0)
MCHC: 34.4 g/dL (ref 30.0–36.0)
MCV: 88.5 fL (ref 78.0–100.0)
Platelets: 68 10*3/uL — ABNORMAL LOW (ref 150–400)
RBC: 3.48 MIL/uL — ABNORMAL LOW (ref 3.87–5.11)

## 2010-04-04 NOTE — H&P (Signed)
[name], [name] NO.:  [id number]  MEDICAL RECORD NO.:  [id number]           PATIENT TYPE:  E  LOCATION:  WLED                         FACILITY:  [facility]  PHYSICIAN:  Vania Rea, M.D. DATE OF BIRTH:  1934/03/22  DATE OF ADMISSION:  04/01/2010 DATE OF DISCHARGE:                             HISTORY & PHYSICAL   PRIMARY CARE PHYSICIAN:  Tera Mater. [name], M.D. with [facility].  ONCOLOGIST:  Pierce Crane, MD.  CHIEF COMPLAINT OF:  Diarrhea and syncope.  HISTORY OF PRESENT ILLNESS:  This is 76 year old Caucasian lady with a history of irritable bowel syndrome which causes chronic diarrhea, was also being treated with intravenous chemotherapy for breast cancer who since today has been having markedly increased frequency and volume of diarrhea, and was found by her husband dizzy in the bathroom with the floor covered in liquid yellow stool and when he tried to help her to get up, she passed out and fell and hit her legs.  She was transported to the emergency room where she was found to be dehydrated. The oncology service was contacted and they recommended hospitalist admission and therefore the hospitalist service was called to assist with management.  The patient is now alert and oriented, complaining of thirst.  She reports that she has been having loose watery diarrhea for years and that this has been diagnosed with irritable bowel syndrome.  Says the diarrhea does interfere with her sleep but it seems to have been much more today she has been having no fever, no cough, no cold.  She reports that she gets 3 chemotherapy injections via her Port-A-Cath every fortnight and that the day after the chemotherapy injections he usually gets a dose of Neupogen and usually gets her blood work checked again 5 days later.  She last had chemotherapy last Thursday and last white count we have available for her in our system is December 13 when she  had a white count of 9.2.  PAST MEDICAL HISTORY: 1. Remote history of left lumpectomy for the breast cancer 15 years     Ago. 2. Right-sided breast cancer diagnosed in October 2011, being treated     with chemotherapy. 3. Abnormal lung pathology, questionable lymphoma, not being treated     at the moment. 4. Fibromyalgia. 5. Osteoporosis. 6. Irritable bowel syndrome. 7. Obstructive sleep apnea, not on Z-Pak. 8. Hyperlipidemia. 9. Nondiagnostic pulmonary nodules as noted above. 10.Chronic cough with normal spirometry.  PAST SURGICAL HISTORY:  Includes; 1. Hysterectomy. 2. Oophorectomy. 3. Cholecystectomy. 4. Appendectomy. 5. She has had back surgery and left breast lumpectomy.  MEDICATIONS:  Include; 1. Combivent inhaler 1 to 2 puffs daily. 2. Hydrochlorothiazide 25 mg daily. 3. Synthroid 88 mcg daily. 4. Vitamin D 50,000 units weekly. 5. Vytorin 10/80 at bedtime. 6. Fluoxetine 40 mg each evening. 7. Exelon 60 mg twice daily. 8. Colestipol 1 g twice daily. 9. Budesonide 3 mg three times daily. 10.Advair Diskus 250/50 two puffs twice daily. 11.Ativan 1 mg three times daily as needed.  ALLERGIES:  To PENICILLIN and ADHESIVES.  SOCIAL HISTORY:  No tobacco, alcohol, or illicit drug use.  She  is a retired Sales promotion account executive.  FAMILY HISTORY:  Significant for father with emphysema.  Mother and sister with ovarian cancer.  REVIEW OF SYSTEMS:  Other than noted above, complete review of systems is unremarkable.  PHYSICAL EXAMINATION:  GENERAL:  Pleasant elderly Caucasian lady reclining in the stretcher, not acutely distressed at this time.  She is notably markedly orthostatic and dizzy when she stands. VITALS:  Her temperature is 97.2.  Her pulse is lying 62, standing it is 87.  Her blood pressure is 101/64 lying and standing it is 72/48.  She is saturating at 96% on 2 L. HEENT:  Pupils are round and equal.  Mucous membranes pink, dry, anicteric. NECK:  No cervical  lymphadenopathy or thyromegaly.  No carotid bruit. CHEST:  Clear to auscultation bilaterally. CARDIOVASCULAR SYSTEM:  Regular rhythm.  No murmur. ABDOMEN:  Obese, soft, nontender. EXTREMITIES:  Without edema.  She has osseous deformities of hands, knees, ankles. CENTRAL NERVOUS SYSTEM:  Cranial nerves II through XII are grossly intact.  She has no focal lateralizing signs.  LABORATORY DATA:  Her white count is 0.7, her hemoglobin is 14, her platelet count is 160,000.  There are not enough white cells for differential could be performed.  Large  platelets are noted.  Complete metabolic panel shows sodium low at 134, potassium 4.2, chloride 95, CO2 22, glucose 110, BUN and creatinine ratio elevated to 21/0.92.  Her ALT is elevated at 58, calcium is 9.7.  Her liver functions otherwise unremarkable.  Her INR is normal.  Her cardiac enzymes are completely normal with undetectable CK-MB and troponin and myoglobin was 78.  Two- view chest x-ray shows emphysema and stable ground-glass opacities in the right upper and lower lobes.  CT scan of head shows normal noncontrast CT appearance of the brain for a day.  ASSESSMENT: 1. Acute on chronic enteritis of unclear etiology. 2. Hypertension  orthostatic related to dehydration. 3. Neutropenia secondary to chemotherapy.  4. Breast cancer on chemotherapy. 5. Irritable bowel syndrome. 6. Hyperlipidemia. 7. Hypothyroidism.  PLAN: 1. We will admit this lady to the hospitalist service with the plan     for management be taken over by the oncologist in the morning. 2. We will continue her home medications with the exception of     hydrochlorothiazide.  We will continue to hydrate vigorously for     the time being. 3. Will start Neupogen because of her severe neutropenia. 4. We understand that Dr. Dalene Carrow will see this patient in the morning.     Other plans as per orders.     Vania Rea, M.D.     LC/MEDQ  D:  04/01/2010  T:   04/01/2010  Job:  [id number]  cc:   Pierce Crane, MD Fax: [phone]  Lauretta I. Odogwu, M.D. Fax: [phone]  Tera Mater. [name], M.D. Fax: [phone]  Electronically Signed by Vania Rea M.D. on 04/03/2010 11:44:11 PM

## 2010-04-05 LAB — BASIC METABOLIC PANEL
BUN: 2 mg/dL — ABNORMAL LOW (ref 6–23)
CO2: 25 mEq/L (ref 19–32)
Calcium: 7.7 mg/dL — ABNORMAL LOW (ref 8.4–10.5)
Creatinine, Ser: 0.66 mg/dL (ref 0.4–1.2)
Glucose, Bld: 102 mg/dL — ABNORMAL HIGH (ref 70–99)

## 2010-04-05 LAB — DIFFERENTIAL
Basophils Relative: 2 % — ABNORMAL HIGH (ref 0–1)
Eosinophils Absolute: 0 10*3/uL (ref 0.0–0.7)
Eosinophils Relative: 0 % (ref 0–5)
Lymphs Abs: 0.3 10*3/uL — ABNORMAL LOW (ref 0.7–4.0)
Monocytes Absolute: 0 10*3/uL — ABNORMAL LOW (ref 0.1–1.0)

## 2010-04-05 LAB — CBC
HCT: 29.4 % — ABNORMAL LOW (ref 36.0–46.0)
Hemoglobin: 10.2 g/dL — ABNORMAL LOW (ref 12.0–15.0)
MCH: 30.6 pg (ref 26.0–34.0)
MCHC: 34.7 g/dL (ref 30.0–36.0)

## 2010-04-06 LAB — CBC
Hemoglobin: 10 g/dL — ABNORMAL LOW (ref 12.0–15.0)
MCH: 30.2 pg (ref 26.0–34.0)
MCHC: 34.2 g/dL (ref 30.0–36.0)
Platelets: 59 10*3/uL — ABNORMAL LOW (ref 150–400)

## 2010-04-06 LAB — BASIC METABOLIC PANEL
CO2: 22 mEq/L (ref 19–32)
Calcium: 7.6 mg/dL — ABNORMAL LOW (ref 8.4–10.5)
Creatinine, Ser: 0.71 mg/dL (ref 0.4–1.2)
GFR calc Af Amer: >60 mL/min (ref >60)

## 2010-04-06 LAB — DIFFERENTIAL
Basophils Absolute: 0 10*3/uL (ref 0.0–0.1)
Eosinophils Absolute: 0 10*3/uL (ref 0.0–0.7)
Lymphs Abs: 0.4 10*3/uL — ABNORMAL LOW (ref 0.7–4.0)
Monocytes Absolute: 0.1 10*3/uL (ref 0.1–1.0)
Neutro Abs: 0.9 10*3/uL — ABNORMAL LOW (ref 1.7–7.7)

## 2010-04-07 LAB — BASIC METABOLIC PANEL
CO2: 25 mEq/L (ref 19–32)
Calcium: 7.5 mg/dL — ABNORMAL LOW (ref 8.4–10.5)
Creatinine, Ser: 0.61 mg/dL (ref 0.4–1.2)
GFR calc Af Amer: >60 mL/min (ref >60)
Sodium: 142 mEq/L (ref 135–145)

## 2010-04-07 LAB — CBC
MCH: 30.4 pg (ref 26.0–34.0)
MCHC: 34.3 g/dL (ref 30.0–36.0)
Platelets: 70 10*3/uL — ABNORMAL LOW (ref 150–400)
RBC: 3.26 MIL/uL — ABNORMAL LOW (ref 3.87–5.11)
RDW: 13 % (ref 11.5–15.5)

## 2010-04-07 LAB — DIFFERENTIAL
Basophils Absolute: 0 10*3/uL (ref 0.0–0.1)
Eosinophils Absolute: 0 10*3/uL (ref 0.0–0.7)
Lymphocytes Relative: 22 % (ref 12–46)
Lymphs Abs: 0.5 10*3/uL — ABNORMAL LOW (ref 0.7–4.0)
Neutro Abs: 1.4 10*3/uL — ABNORMAL LOW (ref 1.7–7.7)

## 2010-04-08 LAB — CBC
MCHC: 34 g/dL (ref 30.0–36.0)
Platelets: ADEQUATE 10*3/uL (ref 150–400)
RDW: 13 % (ref 11.5–15.5)
WBC: 3.1 10*3/uL — ABNORMAL LOW (ref 4.0–10.5)

## 2010-04-08 LAB — DIFFERENTIAL
Basophils Absolute: 0 10*3/uL (ref 0.0–0.1)
Eosinophils Absolute: 0 10*3/uL (ref 0.0–0.7)
Lymphocytes Relative: 18 % (ref 12–46)
Monocytes Relative: 14 % — ABNORMAL HIGH (ref 3–12)
Neutro Abs: 2.1 10*3/uL (ref 1.7–7.7)
Neutrophils Relative %: 67 % (ref 43–77)
Smear Review: ADEQUATE

## 2010-04-08 LAB — BASIC METABOLIC PANEL
BUN: 1 mg/dL — ABNORMAL LOW (ref 6–23)
Chloride: 112 mEq/L (ref 96–112)
GFR calc Af Amer: >60 mL/min (ref >60)
GFR calc non Af Amer: >60 mL/min (ref >60)
Potassium: 3.6 mEq/L (ref 3.5–5.1)
Sodium: 142 mEq/L (ref 135–145)

## 2010-04-10 ENCOUNTER — Other Ambulatory Visit: Payer: Self-pay | Admitting: Oncology

## 2010-04-10 ENCOUNTER — Encounter (HOSPITAL_BASED_OUTPATIENT_CLINIC_OR_DEPARTMENT_OTHER): Payer: 59 | Admitting: Oncology

## 2010-04-10 DIAGNOSIS — Z5111 Encounter for antineoplastic chemotherapy: Secondary | ICD-10-CM

## 2010-04-10 DIAGNOSIS — C50919 Malignant neoplasm of unspecified site of unspecified female breast: Secondary | ICD-10-CM

## 2010-04-10 LAB — CBC WITH DIFFERENTIAL/PLATELET
BASO%: 1.1 % (ref 0.0–2.0)
Eosinophils Absolute: 0 10*3/uL (ref 0.0–0.5)
HCT: 35.7 % (ref 34.8–46.6)
LYMPH%: 11.3 % — ABNORMAL LOW (ref 14.0–49.7)
MCHC: 33.9 g/dL (ref 31.5–36.0)
MONO#: 0.2 10*3/uL (ref 0.1–0.9)
NEUT#: 5.5 10*3/uL (ref 1.5–6.5)
NEUT%: 83.7 % — ABNORMAL HIGH (ref 38.4–76.8)
Platelets: 153 10*3/uL (ref 145–400)
WBC: 6.6 10*3/uL (ref 3.9–10.3)
lymph#: 0.7 10*3/uL — ABNORMAL LOW (ref 0.9–3.3)

## 2010-04-10 LAB — BASIC METABOLIC PANEL
CO2: 23 mEq/L (ref 19–32)
Chloride: 105 mEq/L (ref 96–112)
Creatinine, Ser: 0.68 mg/dL (ref 0.40–1.20)
Glucose, Bld: 97 mg/dL (ref 70–99)
Sodium: 140 mEq/L (ref 135–145)

## 2010-04-16 ENCOUNTER — Encounter (HOSPITAL_BASED_OUTPATIENT_CLINIC_OR_DEPARTMENT_OTHER): Payer: 59 | Admitting: Oncology

## 2010-04-16 ENCOUNTER — Other Ambulatory Visit: Payer: Self-pay | Admitting: Oncology

## 2010-04-16 ENCOUNTER — Encounter: Payer: Self-pay | Admitting: Cardiology

## 2010-04-16 DIAGNOSIS — Z5111 Encounter for antineoplastic chemotherapy: Secondary | ICD-10-CM

## 2010-04-16 DIAGNOSIS — E876 Hypokalemia: Secondary | ICD-10-CM

## 2010-04-16 DIAGNOSIS — Z171 Estrogen receptor negative status [ER-]: Secondary | ICD-10-CM

## 2010-04-16 DIAGNOSIS — C50919 Malignant neoplasm of unspecified site of unspecified female breast: Secondary | ICD-10-CM

## 2010-04-16 LAB — CBC WITH DIFFERENTIAL/PLATELET
BASO%: 0.1 % (ref 0.0–2.0)
Basophils Absolute: 0 10*3/uL (ref 0.0–0.1)
EOS%: 0.3 % (ref 0.0–7.0)
HCT: 35.7 % (ref 34.8–46.6)
HGB: 12.3 g/dL (ref 11.6–15.9)
MCH: 30.8 pg (ref 25.1–34.0)
MCHC: 34.5 g/dL (ref 31.5–36.0)
MCV: 89.2 fL (ref 79.5–101.0)
MONO%: 10 % (ref 0.0–14.0)
NEUT%: 75.5 % (ref 38.4–76.8)

## 2010-04-17 ENCOUNTER — Other Ambulatory Visit: Payer: Self-pay | Admitting: Physician Assistant

## 2010-04-17 ENCOUNTER — Encounter (HOSPITAL_BASED_OUTPATIENT_CLINIC_OR_DEPARTMENT_OTHER): Payer: 59 | Admitting: Oncology

## 2010-04-17 DIAGNOSIS — C50919 Malignant neoplasm of unspecified site of unspecified female breast: Secondary | ICD-10-CM

## 2010-04-17 DIAGNOSIS — Z5111 Encounter for antineoplastic chemotherapy: Secondary | ICD-10-CM

## 2010-04-17 LAB — COMPREHENSIVE METABOLIC PANEL
AST: 42 U/L — ABNORMAL HIGH (ref 0–37)
Alkaline Phosphatase: 81 U/L (ref 39–117)
BUN: 8 mg/dL (ref 6–23)
Creatinine, Ser: 0.67 mg/dL (ref 0.40–1.20)
Total Bilirubin: 0.4 mg/dL (ref 0.3–1.2)

## 2010-04-18 ENCOUNTER — Encounter (HOSPITAL_BASED_OUTPATIENT_CLINIC_OR_DEPARTMENT_OTHER): Payer: 59 | Admitting: Oncology

## 2010-04-18 DIAGNOSIS — C50919 Malignant neoplasm of unspecified site of unspecified female breast: Secondary | ICD-10-CM

## 2010-04-18 DIAGNOSIS — E860 Dehydration: Secondary | ICD-10-CM

## 2010-04-19 NOTE — Discharge Summary (Signed)
[name], [name]             ACCOUNT NO.:  [id number]  MEDICAL RECORD NO.:  [id number]           PATIENT TYPE:  I  LOCATION:  1311                         FACILITY:  [facility]  PHYSICIAN:  Marcellus Scott, MD     DATE OF BIRTH:  1935-01-28  DATE OF ADMISSION:  04/01/2010 DATE OF DISCHARGE:  04/08/2010                              DISCHARGE SUMMARY   PRIMARY CARE PHYSICIAN:  Jeannett Senior A. [name], MD  ONCOLOGIST:  Pierce Crane, MD  GASTROENTEROLOGIST:  Petra Kuba, MD  DISCHARGE DIAGNOSES: 1. Syncope secondary to orthostatic hypotension. 2. Gastroenteritis. 3. History of irritable bowel syndrome and microscopic colitis. 4. Neutropenic fever. 5. Hypokalemia. 6. Breast cancer, ongoing chemotherapy. 7. Thrombocytopenia. 8. Normocytic anemia. 9. Stomatitis. 10.Fibromyalgia. 11.History of osteoporosis. 12.History of obstructive sleep apnea. 13.History of hyperlipidemia. 14.History of hypothyroidism. 15.Chronic obstructive pulmonary disease. 16.History of chronic cough.  DISCHARGE MEDICATIONS: 1. Magic mouthwash 15 mL p.o. q.4 hourly p.r.n. for sore mouth. 2. Budesonide 3 mg capsule, 3 capsules p.o. every morning. 3. Advair Diskus 250/50, 2 puffs inhaled b.i.d. 4. Ativan 1 mg p.o. t.i.d. p.r.n. for nausea or anxiety. 5. Chemotherapy regimen IV per her oncologist. 6. Colestipol 1 g p.o. b.i.d. 7. Combivent 1-2 puffs inhaled daily p.r.n. for dyspnea. 8. Dexilant 60 mg p.o. b.i.d. 9. Fluoxetine 40 mg p.o. q.p.m. 10.Hydrochlorothiazide 25 mg p.o. daily. 11.Synthroid 88 mcg p.o. q.a.m. 12.Vitamin D 50,000 units p.o. weekly on Tuesdays. 13.Vytorin 10/80, 1 p.o. nightly.  IMAGING: 1. CT of the head without contrast on February 1st - impression:     Normal noncontrast CT appearance of the brain for age. 2. Chest x-ray on February 1st - impression:  COPD/emphysema.  Stable,     minimal, vague ground-glass opacities in the right upper and lower     lobes, likely postinflammatory.   No acute cardiopulmonary disease.  LABORATORY DATA:  Basic metabolic panel today unremarkable.  BUN 1, creatinine 0.64.  CBC, hemoglobin 10.3, hematocrit 30, white blood cell 3.1, platelets are clumped on smear, but counts appear adequate according to the hematology department.  C difficile by PCR was negative.  MRSA PCR screening was negative.  Urinalysis showed 3-6 white blood cells and few bacteria.  Hepatic panel on admission only pertinent for ALT of 58, INR was 1.07.  Point-of-care cardiac markers were negative.  CONSULTATIONS:  Oncology, Pierce Crane, MD  ACTIVITY:  Increase activity slowly and then as tolerated.  She will have a rolling walker.  DIET:  Heart-healthy diet.  COMPLAINTS TODAY:  The patient indicates that apart from generalized mild weakness, she feels back to her baseline.  She usually has 8-10 watery stools in the day and 2 stools at night which is her baseline. She denies any abdominal pain.  She is eager to return home.  PHYSICAL EXAMINATION:  GENERAL:  The patient is in no obvious distress. VITAL SIGNS:  Temperature 98.1 degrees Fahrenheit, pulse 88 per minute, respiration 16 per minute, blood pressure 155/78 mmHg, and saturating at 97% on room air. RESPIRATORY SYSTEM:  Clear. CARDIOVASCULAR SYSTEM:  First and second heart sounds heard.  Regular rate and rhythm. ABDOMEN:  Nondistended, nontender, soft,  and bowel sounds present. CENTRAL NERVOUS SYSTEM:  Patient is awake, alert, oriented x3 with no focal deficits. EXTREMITIES:  With trace bilateral ankle edema, but grade 5/5 power.  HOSPITAL COURSE:  [name] is a pleasant 76 year old Caucasian female patient with history of breast cancer, undergoing chemotherapy, irritable bowel syndrome, microscopic colitis who has chronic diarrhea and now presented with syncopal episode and worsening diarrhea.  In the emergency room, she was found to be severely neutropenic.  The [facility] hospitalist were requested  to admit for further management.  Please see Dr. Thayer Ohm Campbell's history and physical note for admission details.  1. Syncope secondary to orthostatic hypotension.  The patient's     syncope and orthostatic hypotension are likely secondary to     dehydration from her diarrhea.  The patient was hydrated and at     this point the patient is asymptomatic even with ambulation. 2. Gastroenteritis, possibly viral complicating underlying irritable     bowel syndrome and microscopic colitis.  Her stools are now back to     baseline.  She was briefly placed on Questran.  I discussed her     case with her primary gastroenterologist, Dr. Ewing Schlein, who     recommended continuing her Entocort or budesonide 9 mg in the a.m. and        her colestipol which has to be taken greater than      an hour apart from other medications.  She can follow up with him     as an outpatient. 3. Neutropenic fever.  The patient spiked temperature in the hospital.     In the setting of profound neutropenia, she was placed on empiric     Primaxin and given a dose of Neupogen.  Her white blood cell counts     continued to improve.  Primaxin was discontinued yesterday and she     has had no further fever. 4. Hypokalemia secondary to GI losses.  Repleted. 5. Breast cancer.  I discussed her case with Dr. Donnie Coffin who has cleared     her for discharge and will be followed by him in the next 2-3 days     with repeat CBC. 6. Thrombocytopenia, possibly from recent chemotherapy.  Again follow     up CBC closely as an outpatient. 7. Stomatitis.  The patient will go home on Magic mouthwash p.r.n.     This is likely secondary to her chemotherapy.  DISPOSITION:  The patient at this time is discharged home in stable condition.  FOLLOWUP RECOMMENDATIONS: 1. Follow up with Dr. Pierce Crane.  The patient is to call for an     appointment to be seen in the next 2-3 days with repeat CBC. 2. Follow up with Dr. Vida Rigger.  The patient is to  call for an     appointment. 3. Follow up with Dr. Adrian Prince.  Again the patient is to call for     an appointment.  Time taken in coordinating this discharge is 35 minutes.     Marcellus Scott, MD     AH/MEDQ  D:  04/08/2010  T:  04/09/2010  Job:  [id number]  cc:   Tera Mater. [name], M.D. Fax: [phone]  Pierce Crane, MD Fax: [phone]  Petra Kuba, M.D. Fax: [phone]  Electronically Signed by Marcellus Scott MD on 04/19/2010 08:47:53 PM

## 2010-04-20 NOTE — Progress Notes (Signed)
[name], [name]             ACCOUNT NO.:  [id number]  MEDICAL RECORD NO.:  [id number]           PATIENT TYPE:  I  LOCATION:  1311                         [facility]AC[facility]  PHYSICIAN:  Hillery Aldo, M.D.   DATE OF BIRTH:  10/28/1934                                PROGRESS NOTE   DATE OF DISCHARGE: Pending.  PRIMARY CARE PHYSICIAN: [name] Kanner, M.D.  ONCOLOGIST: [name], M.D.  CURRENT DIAGNOSES: 1. Syncope secondary to orthostatic hypotension. 2. Gastroenteritis. 3. History of irritable bowel syndrome. 4. Neutropenic fever. 5. Hypokalemia. 6. Breast cancer. 7. Thrombocytopenia. 8. Normocytic anemia. 9. Stomatitis. 10.Fibromyalgia. 11.History of osteoporosis. 12.Obstructive sleep apnea. 13.Hyperlipidemia. 14.History of chronic cough. 15.Hypothyroidism. 16.Chronic obstructive pulmonary disease.  DISCHARGE MEDICATIONS: Will be dictated at the time of actual discharge.  CONSULTATIONS: [name], M.D. of oncology.  BRIEF ADMISSION HPI: The patient is a 76 year old female with past medical history of breast cancer and irritable bowel syndrome who is undergoing chemotherapy by Dr. Donnie [name].  She presented to the hospital with a chief complaint of a syncopal episode and worsening diarrhea.  Upon initial evaluation in the emergency department, the patient was found to be severely neutropenic, subsequently was referred to the hospitalist service for further evaluation and treatment.  For full details, please see the dictated report done by Dr. Orvan Falconer.  PROCEDURES AND DIAGNOSTIC STUDIES: 1. Chest x-ray on April 01, 2010, showed COPD/emphysema.  Stable     minimal vague ground-glass opacities in the right upper and lower     lobes, likely postinflammatory.  No acute cardiopulmonary disease. 2. CT scan of the head on April 01, 2010, showed normal noncontrast     CT appearance of the brain for age.  DISCHARGE LABORATORY VALUES: Will be dictated at  the time of actual discharge.  HOSPITAL COURSE: 1. Syncope secondary to orthostatic hypotension.  The patient's     syncope and orthostatic hypotension are likely due to dehydration     from severe diarrhea.  The patient was hydrated, and at this point,     is asymptomatic. 2. Gastroenteritis/history of irritable bowel syndrome.  The patient     has daily diarrhea from her history of irritable bowel syndrome but     it was worse prior to admission.  She was hydrated, and at this     point, the acute portion of her gastroenteritis appears to be     resolving.  She has been placed on Questran to help decrease her     diarrhea. 3. Neutropenic fever.  The patient did spike a fever while in the     hospital and in the setting of profound neutropenia, she was put on     empiric therapy with Primaxin.  She was also given a dose of     Neupogen.  At this point, her white blood cells are beginning to     recover.  The Primaxin has been discontinued as we continue to     monitor her for resolution of fever prior to discharge. 4. Hypokalemia.  Secondary to GI losses.  The patient was     appropriately repleted. 5.  Breast cancer.  The patient has been seen and evaluated by Dr.     Donnie [name] and will follow up with him for ongoing therapy. 6. Thrombocytopenia.  Secondary to recent chemotherapy.  There has not     been any sign of bleeding. 7. Normocytic anemia.  Secondary to recent chemotherapy.  The     patient's hemoglobin and hematocrit have been have been stable. 8. Stomatitis.  Secondary to chemotherapy.  The patient was placed on     Magic Mouthwash for symptomatic treatment. 9. The patient's other chronic medical problems have remained stable     while in hospital.  DISPOSITION: The patient is not yet medically stable for discharge.  It is anticipated that she will be ready for discharge in the next 24 hours. Discharge summary addendum will be dictated at that time.     Hillery Aldo,  M.D.     CR/MEDQ  D:  04/07/2010  T:  04/07/2010  Job:  [id number]  cc:   Kelsey Crane, MD Fax: [phone]  Tera Mater. [name] Kanner, M.D. Fax: [phone]  Electronically Signed by Hillery Aldo M.D. on 04/20/2010 04:23:20 PM

## 2010-04-22 ENCOUNTER — Encounter: Payer: Self-pay | Admitting: Pulmonary Disease

## 2010-04-22 ENCOUNTER — Ambulatory Visit (INDEPENDENT_AMBULATORY_CARE_PROVIDER_SITE_OTHER): Payer: 59 | Admitting: Pulmonary Disease

## 2010-04-22 DIAGNOSIS — R05 Cough: Secondary | ICD-10-CM

## 2010-04-22 DIAGNOSIS — R222 Localized swelling, mass and lump, trunk: Secondary | ICD-10-CM

## 2010-04-22 NOTE — Progress Notes (Signed)
Summary: [facility]: Office Visit  [facility]: Office Visit   Imported By: Earl Many 04/14/2010 18:45:53  _____________________________________________________________________  External Attachment:    Type:   Image     Comment:   External Document

## 2010-04-23 ENCOUNTER — Encounter (HOSPITAL_BASED_OUTPATIENT_CLINIC_OR_DEPARTMENT_OTHER): Payer: 59 | Admitting: Oncology

## 2010-04-23 ENCOUNTER — Other Ambulatory Visit: Payer: Self-pay | Admitting: Oncology

## 2010-04-23 ENCOUNTER — Encounter: Payer: Self-pay | Admitting: Cardiology

## 2010-04-23 DIAGNOSIS — K589 Irritable bowel syndrome without diarrhea: Secondary | ICD-10-CM

## 2010-04-23 DIAGNOSIS — Z171 Estrogen receptor negative status [ER-]: Secondary | ICD-10-CM

## 2010-04-23 DIAGNOSIS — C50919 Malignant neoplasm of unspecified site of unspecified female breast: Secondary | ICD-10-CM

## 2010-04-23 DIAGNOSIS — Z5111 Encounter for antineoplastic chemotherapy: Secondary | ICD-10-CM

## 2010-04-23 LAB — COMPREHENSIVE METABOLIC PANEL
Albumin: 3.3 g/dL — ABNORMAL LOW (ref 3.5–5.2)
CO2: 30 mEq/L (ref 19–32)
Chloride: 100 mEq/L (ref 96–112)
Glucose, Bld: 139 mg/dL — ABNORMAL HIGH (ref 70–99)
Potassium: 3.4 mEq/L — ABNORMAL LOW (ref 3.5–5.3)
Sodium: 138 mEq/L (ref 135–145)
Total Protein: 5.4 g/dL — ABNORMAL LOW (ref 6.0–8.3)

## 2010-04-23 LAB — CBC WITH DIFFERENTIAL/PLATELET
Eosinophils Absolute: 0 10*3/uL (ref 0.0–0.5)
MONO#: 0 10*3/uL — ABNORMAL LOW (ref 0.1–0.9)
NEUT#: 1.7 10*3/uL (ref 1.5–6.5)
Platelets: 101 10*3/uL — ABNORMAL LOW (ref 145–400)
RBC: 3.7 10*6/uL (ref 3.70–5.45)
RDW: 14.3 % (ref 11.2–14.5)
WBC: 2.1 10*3/uL — ABNORMAL LOW (ref 3.9–10.3)

## 2010-04-28 NOTE — Progress Notes (Signed)
Summary: [facility]: Office Progress Note  [facility]: Office Progress Note   Imported By: Earl Many 04/22/2010 08:48:39  _____________________________________________________________________  External Attachment:    Type:   Image     Comment:   External Document

## 2010-05-08 ENCOUNTER — Other Ambulatory Visit: Payer: Self-pay | Admitting: Physician Assistant

## 2010-05-08 ENCOUNTER — Encounter (HOSPITAL_BASED_OUTPATIENT_CLINIC_OR_DEPARTMENT_OTHER): Payer: 59 | Admitting: Oncology

## 2010-05-08 ENCOUNTER — Other Ambulatory Visit: Payer: Self-pay | Admitting: Oncology

## 2010-05-08 DIAGNOSIS — Z171 Estrogen receptor negative status [ER-]: Secondary | ICD-10-CM

## 2010-05-08 DIAGNOSIS — Z5111 Encounter for antineoplastic chemotherapy: Secondary | ICD-10-CM

## 2010-05-08 DIAGNOSIS — E876 Hypokalemia: Secondary | ICD-10-CM

## 2010-05-08 DIAGNOSIS — C50911 Malignant neoplasm of unspecified site of right female breast: Secondary | ICD-10-CM

## 2010-05-08 DIAGNOSIS — C50919 Malignant neoplasm of unspecified site of unspecified female breast: Secondary | ICD-10-CM

## 2010-05-08 DIAGNOSIS — E860 Dehydration: Secondary | ICD-10-CM

## 2010-05-08 LAB — CBC WITH DIFFERENTIAL/PLATELET
Eosinophils Absolute: 0 10*3/uL (ref 0.0–0.5)
MONO#: 1.1 10*3/uL — ABNORMAL HIGH (ref 0.1–0.9)
MONO%: 13.2 % (ref 0.0–14.0)
NEUT#: 5.8 10*3/uL (ref 1.5–6.5)
RBC: 4.09 10*6/uL (ref 3.70–5.45)
RDW: 15.7 % — ABNORMAL HIGH (ref 11.2–14.5)
WBC: 8.1 10*3/uL (ref 3.9–10.3)

## 2010-05-08 LAB — COMPREHENSIVE METABOLIC PANEL
ALT: 41 U/L — ABNORMAL HIGH (ref 0–35)
Albumin: 3.6 g/dL (ref 3.5–5.2)
Alkaline Phosphatase: 134 U/L — ABNORMAL HIGH (ref 39–117)
CO2: 24 mEq/L (ref 19–32)
Glucose, Bld: 86 mg/dL (ref 70–99)
Potassium: 3.1 mEq/L — ABNORMAL LOW (ref 3.5–5.3)
Sodium: 140 mEq/L (ref 135–145)
Total Protein: 5.7 g/dL — ABNORMAL LOW (ref 6.0–8.3)

## 2010-05-09 ENCOUNTER — Encounter (HOSPITAL_BASED_OUTPATIENT_CLINIC_OR_DEPARTMENT_OTHER): Payer: 59 | Admitting: Oncology

## 2010-05-09 DIAGNOSIS — E860 Dehydration: Secondary | ICD-10-CM

## 2010-05-09 DIAGNOSIS — C50919 Malignant neoplasm of unspecified site of unspecified female breast: Secondary | ICD-10-CM

## 2010-05-11 ENCOUNTER — Other Ambulatory Visit: Payer: Self-pay | Admitting: Oncology

## 2010-05-11 DIAGNOSIS — C50911 Malignant neoplasm of unspecified site of right female breast: Secondary | ICD-10-CM

## 2010-05-11 LAB — AFB CULTURE WITH SMEAR (NOT AT ARMC)
Acid Fast Smear: NONE SEEN
Acid Fast Smear: NONE SEEN

## 2010-05-11 LAB — FUNGUS CULTURE W SMEAR
Fungal Smear: NONE SEEN
Fungal Smear: NONE SEEN

## 2010-05-11 LAB — BASIC METABOLIC PANEL
BUN: 14 mg/dL (ref 6–23)
Calcium: 9.1 mg/dL (ref 8.4–10.5)
Creatinine, Ser: 0.94 mg/dL (ref 0.4–1.2)
GFR calc non Af Amer: 58 mL/min — ABNORMAL LOW (ref >60)
Glucose, Bld: 86 mg/dL (ref 70–99)
Sodium: 137 mEq/L (ref 135–145)

## 2010-05-11 LAB — CULTURE, RESPIRATORY W GRAM STAIN: Gram Stain: NONE SEEN

## 2010-05-12 LAB — BASIC METABOLIC PANEL
BUN: 11 mg/dL (ref 6–23)
Calcium: 9.2 mg/dL (ref 8.4–10.5)
Creatinine, Ser: 1.03 mg/dL (ref 0.4–1.2)
GFR calc Af Amer: >60 mL/min (ref >60)

## 2010-05-12 LAB — APTT
aPTT: 28 seconds (ref 24–37)
aPTT: 25 seconds (ref 24–37)

## 2010-05-12 LAB — CBC
MCH: 31.8 pg (ref 26.0–34.0)
MCV: 94.8 fL (ref 78.0–100.0)
Platelets: 216 10*3/uL (ref 150–400)
Platelets: 200 10*3/uL (ref 150–400)
RBC: 4.37 MIL/uL (ref 3.87–5.11)
RBC: 4.4 MIL/uL (ref 3.87–5.11)
RDW: 12.5 % (ref 11.5–15.5)
RDW: 13.6 % (ref 11.5–15.5)
WBC: 9.2 10*3/uL (ref 4.0–10.5)
WBC: 7.5 10*3/uL (ref 4.0–10.5)

## 2010-05-12 LAB — SURGICAL PCR SCREEN: Staphylococcus aureus: NEGATIVE

## 2010-05-12 LAB — PROTIME-INR
Prothrombin Time: 12.8 seconds (ref 11.6–15.2)
Prothrombin Time: 12.3 seconds (ref 11.6–15.2)

## 2010-05-12 NOTE — Assessment & Plan Note (Signed)
Summary: rov for chronic cough     Copy to:  Pierce Crane MD Primary Provider/Referring Provider:  Laurene Footman MD  CC:  Pt is here for a 4 week f/u appt for her cough.  states cough is 60 to 70% improved.  Pt requesting rx for Tessalon Perles.   Pt states she was recently at [facility] for 9 days in Feb. d/t "complications with chemo." .  History of Present Illness: the pt comes in today for f/u of her chronic cough.  This is believed to be primarily upper airway in origin, and we have been treating with various things for cyclical coughing, LPR, and postnasal drip.  This is all complicated by ongoing chemo for breast cancer.  She feels her cough is at least 60-70% improved.   Current Medications (verified): 1)  Vytorin 10-80 Mg Tabs (Ezetimibe-Simvastatin) .... Once Daily 2)  Fluoxetine Hcl 40 Mg Caps (Fluoxetine Hcl) .... Once Daily 3)  Hydrochlorothiazide 25 Mg Tabs (Hydrochlorothiazide) .... Once Daily 4)  Synthroid 88 Mcg Tabs (Levothyroxine Sodium) .... Once Daily 5)  Vitamin D (Ergocalciferol) 50000 Unit Caps (Ergocalciferol) .... Once A Week 6)  Zolpidem Tartrate 10 Mg Tabs (Zolpidem Tartrate) .... Once Daily As Needed 7)  Colestipol Hcl 1 Gm Tabs (Colestipol Hcl) .... Take 1 Tablet By Mouth Once A Day 8)  Dexilant 60 Mg Cpdr (Dexlansoprazole) .... Take One By Mouth Am and Pm 9)  Pepcid Ac 10 Mg Tabs (Famotidine) .... Take 1 Tab By Mouth At Bedtime 10)  Chlorpheniramine Maleate 4 Mg Tabs (Chlorpheniramine Maleate) .... 2 Tabs By Mouth At Bedtime 11)  Ativan 0.5 Mg Tabs (Lorazepam) .... As Needed  Allergies (verified): 1)  ! Pcn  Review of Systems       The patient complains of non-productive cough, loss of appetite, anxiety, depression, and joint stiffness or pain.  The patient denies shortness of breath with activity, shortness of breath at rest, productive cough, coughing up blood, chest pain, irregular heartbeats, acid heartburn, indigestion, weight change, abdominal pain,  difficulty swallowing, sore throat, tooth/dental problems, headaches, nasal congestion/difficulty breathing through nose, sneezing, itching, ear ache, hand/feet swelling, rash, change in color of mucus, and fever.    Vital Signs:  Patient profile:   76 year old female Height:      67 inches Weight:      162.38 pounds BMI:     25.52 O2 Sat:      95 % on Room air Temp:     98.1 degrees F oral Pulse rate:   97 / minute BP sitting:   122 / 64  (left arm) Cuff size:   regular  Vitals Entered By: Arman Filter LPN (April 22, 2010 9:44 AM)  O2 Flow:  Room air CC: Pt is here for a 4 week f/u appt for her cough.  states cough is 60 to 70% improved.  Pt requesting rx for Tessalon Perles.   Pt states she was recently at [facility] for 9 days in Feb. d/t "complications with chemo."  Comments Medications reviewed with patient Arman Filter LPN  April 22, 2010 9:53 AM    Physical Exam  General:  wd female, very fatigued from chemo Lungs:  totally clear to auscultation no wheezing or rhonchi Heart:  rrr, no mrg Extremities:  no edema or cyanosis  Neurologic:  alert and oriented, moves all 4    Impression & Recommendations:  Problem # 1:  COUGH (ICD-786.2)  Her cough is 60-70% improved, and she agrees this seems  to be from upper airway.  I have asked her to continue with her current regimen, and suspect the cough will continue to improve.  She is to call me if re-escalates.    Problem # 2:  MASS, LUNG (ICD-786.6)  this is being followed by Dr. Donnie Coffin as she goes thru chemotherapy for her breast cancer.  It is unclear if it represents a lymphoma.    Medications Added to Medication List This Visit: 1)  Colestipol Hcl 1 Gm Tabs (Colestipol hcl) .... Take 1 tablet by mouth once a day 2)  Chlorpheniramine Maleate 4 Mg Tabs (Chlorpheniramine maleate) .... 2 tabs by mouth at bedtime 3)  Ativan 0.5 Mg Tabs (Lorazepam) .... As needed 4)  Tessalon Perles 100 Mg Caps (Benzonatate) .... Two by  mouth every 6 hrs if needed for cough  Other Orders: Est. Patient Level III (04540)  Patient Instructions: 1)  continue the treatment we have outlined for you upper airway cough. 2)  will refill tessalon pearls 3)  if doing well, followup with me in 3mos.   Prescriptions: TESSALON PERLES 100 MG  CAPS (BENZONATATE) two by mouth every 6 hrs if needed for cough  #50 x 1   Entered and Authorized by:   Barbaraann Share MD   Signed by:   Barbaraann Share MD on 04/22/2010   Method used:   Print then Give to Patient   RxID:   [id number]

## 2010-05-12 NOTE — Progress Notes (Signed)
Summary: [facility]: Office Visit  [facility]: Office Visit   Imported By: Earl Many 05/04/2010 09:51:24  _____________________________________________________________________  External Attachment:    Type:   Image     Comment:   External Document

## 2010-05-12 NOTE — Progress Notes (Signed)
Summary: [facility]: Office Visit  [facility]: Office Visit   Imported By: Earl Many 05/08/2010 14:18:33  _____________________________________________________________________  External Attachment:    Type:   Image     Comment:   External Document

## 2010-05-12 NOTE — Progress Notes (Signed)
Summary: [facility]: Office Visit  [facility]: Office Visit   Imported By: Earl Many 05/04/2010 09:43:35  _____________________________________________________________________  External Attachment:    Type:   Image     Comment:   External Document

## 2010-05-19 ENCOUNTER — Ambulatory Visit
Admission: RE | Admit: 2010-05-19 | Discharge: 2010-05-19 | Disposition: A | Discharge status: Home / Self Care | Payer: Medicare Other | Source: Ambulatory Visit | Attending: Oncology | Admitting: Oncology

## 2010-05-19 DIAGNOSIS — C50911 Malignant neoplasm of unspecified site of right female breast: Secondary | ICD-10-CM

## 2010-05-19 MED ORDER — GADOBENATE DIMEGLUMINE 529 MG/ML IV SOLN
14.0000 mL | Freq: Once | INTRAVENOUS | Status: AC | PRN
  Administered 2010-05-19: 14 mL via INTRAVENOUS

## 2010-05-29 ENCOUNTER — Encounter (HOSPITAL_BASED_OUTPATIENT_CLINIC_OR_DEPARTMENT_OTHER): Payer: 59 | Admitting: Oncology

## 2010-05-29 ENCOUNTER — Other Ambulatory Visit: Payer: Self-pay | Admitting: Physician Assistant

## 2010-05-29 DIAGNOSIS — E860 Dehydration: Secondary | ICD-10-CM

## 2010-05-29 DIAGNOSIS — C50919 Malignant neoplasm of unspecified site of unspecified female breast: Secondary | ICD-10-CM

## 2010-05-29 DIAGNOSIS — Z171 Estrogen receptor negative status [ER-]: Secondary | ICD-10-CM

## 2010-05-29 DIAGNOSIS — E876 Hypokalemia: Secondary | ICD-10-CM

## 2010-05-29 LAB — CBC WITH DIFFERENTIAL/PLATELET
Basophils Absolute: 0 10*3/uL (ref 0.0–0.1)
Eosinophils Absolute: 0 10*3/uL (ref 0.0–0.5)
HGB: 11.3 g/dL — ABNORMAL LOW (ref 11.6–15.9)
LYMPH%: 16.3 % (ref 14.0–49.7)
MCV: 93.2 fL (ref 79.5–101.0)
MONO%: 15.7 % — ABNORMAL HIGH (ref 0.0–14.0)
NEUT#: 4.1 10*3/uL (ref 1.5–6.5)
Platelets: 215 10*3/uL (ref 145–400)

## 2010-06-03 ENCOUNTER — Encounter (HOSPITAL_BASED_OUTPATIENT_CLINIC_OR_DEPARTMENT_OTHER): Payer: Medicare Other | Admitting: Oncology

## 2010-06-03 DIAGNOSIS — Z5111 Encounter for antineoplastic chemotherapy: Secondary | ICD-10-CM

## 2010-06-03 DIAGNOSIS — C50919 Malignant neoplasm of unspecified site of unspecified female breast: Secondary | ICD-10-CM

## 2010-06-05 ENCOUNTER — Emergency Department (HOSPITAL_COMMUNITY): Payer: Medicare Other

## 2010-06-05 ENCOUNTER — Emergency Department (HOSPITAL_COMMUNITY)
Admission: EM | Admit: 2010-06-05 | Discharge: 2010-06-05 | Disposition: A | Discharge status: Home / Self Care | Payer: Medicare Other | Attending: Emergency Medicine | Admitting: Emergency Medicine

## 2010-06-05 DIAGNOSIS — R112 Nausea with vomiting, unspecified: Secondary | ICD-10-CM | POA: Insufficient documentation

## 2010-06-05 DIAGNOSIS — R197 Diarrhea, unspecified: Secondary | ICD-10-CM | POA: Insufficient documentation

## 2010-06-05 DIAGNOSIS — M810 Age-related osteoporosis without current pathological fracture: Secondary | ICD-10-CM | POA: Insufficient documentation

## 2010-06-05 DIAGNOSIS — N390 Urinary tract infection, site not specified: Secondary | ICD-10-CM | POA: Insufficient documentation

## 2010-06-05 DIAGNOSIS — E876 Hypokalemia: Secondary | ICD-10-CM | POA: Insufficient documentation

## 2010-06-05 DIAGNOSIS — C50919 Malignant neoplasm of unspecified site of unspecified female breast: Secondary | ICD-10-CM | POA: Insufficient documentation

## 2010-06-05 DIAGNOSIS — IMO0001 Reserved for inherently not codable concepts without codable children: Secondary | ICD-10-CM | POA: Insufficient documentation

## 2010-06-05 LAB — URINE MICROSCOPIC-ADD ON

## 2010-06-05 LAB — CBC
Hemoglobin: 11.1 g/dL — ABNORMAL LOW (ref 12.0–15.0)
MCH: 30.7 pg (ref 26.0–34.0)
MCHC: 32.4 g/dL (ref 30.0–36.0)
MCV: 95 fL (ref 78.0–100.0)
Platelets: 117 10*3/uL — ABNORMAL LOW (ref 150–400)
RBC: 3.61 MIL/uL — ABNORMAL LOW (ref 3.87–5.11)

## 2010-06-05 LAB — BASIC METABOLIC PANEL
BUN: 10 mg/dL (ref 6–23)
CO2: 28 mEq/L (ref 19–32)
Calcium: 8.5 mg/dL (ref 8.4–10.5)
Chloride: 103 mEq/L (ref 96–112)
Creatinine, Ser: 0.54 mg/dL (ref 0.4–1.2)

## 2010-06-05 LAB — URINALYSIS, ROUTINE W REFLEX MICROSCOPIC
Bilirubin Urine: NEGATIVE
Glucose, UA: NEGATIVE mg/dL
Ketones, ur: NEGATIVE mg/dL
Nitrite: NEGATIVE
Urobilinogen, UA: 0.2 mg/dL (ref 0.0–1.0)
pH: 6 (ref 5.0–8.0)

## 2010-06-05 LAB — DIFFERENTIAL
Eosinophils Absolute: 0 10*3/uL (ref 0.0–0.7)
Lymphs Abs: 0.6 10*3/uL — ABNORMAL LOW (ref 0.7–4.0)
Monocytes Absolute: 0.3 10*3/uL (ref 0.1–1.0)
Monocytes Relative: 5 % (ref 3–12)
Neutrophils Relative %: 84 % — ABNORMAL HIGH (ref 43–77)

## 2010-06-06 LAB — URINE CULTURE
Colony Count: 2000
Culture  Setup Time: 201204062148

## 2010-06-10 ENCOUNTER — Other Ambulatory Visit: Payer: Self-pay | Admitting: Physician Assistant

## 2010-06-10 ENCOUNTER — Encounter (HOSPITAL_BASED_OUTPATIENT_CLINIC_OR_DEPARTMENT_OTHER): Payer: Medicare Other | Admitting: Oncology

## 2010-06-10 DIAGNOSIS — Z171 Estrogen receptor negative status [ER-]: Secondary | ICD-10-CM

## 2010-06-10 DIAGNOSIS — E876 Hypokalemia: Secondary | ICD-10-CM

## 2010-06-10 DIAGNOSIS — E860 Dehydration: Secondary | ICD-10-CM

## 2010-06-10 DIAGNOSIS — Z5111 Encounter for antineoplastic chemotherapy: Secondary | ICD-10-CM

## 2010-06-10 DIAGNOSIS — C50919 Malignant neoplasm of unspecified site of unspecified female breast: Secondary | ICD-10-CM

## 2010-06-10 LAB — COMPREHENSIVE METABOLIC PANEL
Albumin: 3.9 g/dL (ref 3.5–5.2)
Alkaline Phosphatase: 91 U/L (ref 39–117)
CO2: 26 mEq/L (ref 19–32)
Calcium: 9.3 mg/dL (ref 8.4–10.5)
Chloride: 105 mEq/L (ref 96–112)
Glucose, Bld: 90 mg/dL (ref 70–99)
Potassium: 4.1 mEq/L (ref 3.5–5.3)
Sodium: 139 mEq/L (ref 135–145)
Total Protein: 5.9 g/dL — ABNORMAL LOW (ref 6.0–8.3)

## 2010-06-10 LAB — CBC WITH DIFFERENTIAL/PLATELET
Basophils Absolute: 0 10*3/uL (ref 0.0–0.1)
EOS%: 7.3 % — ABNORMAL HIGH (ref 0.0–7.0)
Eosinophils Absolute: 0.4 10*3/uL (ref 0.0–0.5)
HCT: 34.4 % — ABNORMAL LOW (ref 34.8–46.6)
HGB: 11.7 g/dL (ref 11.6–15.9)
MONO#: 0.3 10*3/uL (ref 0.1–0.9)
NEUT#: 3.1 10*3/uL (ref 1.5–6.5)
RDW: 15.6 % — ABNORMAL HIGH (ref 11.2–14.5)
lymph#: 1.2 10*3/uL (ref 0.9–3.3)

## 2010-06-17 ENCOUNTER — Encounter (HOSPITAL_BASED_OUTPATIENT_CLINIC_OR_DEPARTMENT_OTHER): Payer: Medicare Other | Admitting: Oncology

## 2010-06-17 ENCOUNTER — Other Ambulatory Visit: Payer: Self-pay | Admitting: Physician Assistant

## 2010-06-17 DIAGNOSIS — Z5111 Encounter for antineoplastic chemotherapy: Secondary | ICD-10-CM

## 2010-06-17 DIAGNOSIS — E876 Hypokalemia: Secondary | ICD-10-CM

## 2010-06-17 DIAGNOSIS — Z171 Estrogen receptor negative status [ER-]: Secondary | ICD-10-CM

## 2010-06-17 DIAGNOSIS — C50919 Malignant neoplasm of unspecified site of unspecified female breast: Secondary | ICD-10-CM

## 2010-06-17 DIAGNOSIS — E860 Dehydration: Secondary | ICD-10-CM

## 2010-06-17 LAB — CBC WITH DIFFERENTIAL/PLATELET
EOS%: 7.1 % — ABNORMAL HIGH (ref 0.0–7.0)
Eosinophils Absolute: 0.1 10*3/uL (ref 0.0–0.5)
MCV: 93.6 fL (ref 79.5–101.0)
MONO%: 10.2 % (ref 0.0–14.0)
NEUT#: 0.6 10*3/uL — ABNORMAL LOW (ref 1.5–6.5)
RBC: 3.57 10*6/uL — ABNORMAL LOW (ref 3.70–5.45)
RDW: 14.9 % — ABNORMAL HIGH (ref 11.2–14.5)
nRBC: 0 % (ref 0–0)

## 2010-06-18 ENCOUNTER — Encounter (HOSPITAL_BASED_OUTPATIENT_CLINIC_OR_DEPARTMENT_OTHER): Payer: Medicare Other | Admitting: Oncology

## 2010-06-18 DIAGNOSIS — C50919 Malignant neoplasm of unspecified site of unspecified female breast: Secondary | ICD-10-CM

## 2010-07-01 ENCOUNTER — Encounter (HOSPITAL_BASED_OUTPATIENT_CLINIC_OR_DEPARTMENT_OTHER): Payer: Medicare Other | Admitting: Oncology

## 2010-07-01 ENCOUNTER — Other Ambulatory Visit: Payer: Self-pay | Admitting: Physician Assistant

## 2010-07-01 DIAGNOSIS — Z171 Estrogen receptor negative status [ER-]: Secondary | ICD-10-CM

## 2010-07-01 DIAGNOSIS — E876 Hypokalemia: Secondary | ICD-10-CM

## 2010-07-01 DIAGNOSIS — C50919 Malignant neoplasm of unspecified site of unspecified female breast: Secondary | ICD-10-CM

## 2010-07-01 DIAGNOSIS — Z5111 Encounter for antineoplastic chemotherapy: Secondary | ICD-10-CM

## 2010-07-01 DIAGNOSIS — E860 Dehydration: Secondary | ICD-10-CM

## 2010-07-01 LAB — COMPREHENSIVE METABOLIC PANEL
ALT: 24 U/L (ref 0–35)
CO2: 24 mEq/L (ref 19–32)
Calcium: 8.1 mg/dL — ABNORMAL LOW (ref 8.4–10.5)
Chloride: 106 mEq/L (ref 96–112)
Potassium: 3.2 mEq/L — ABNORMAL LOW (ref 3.5–5.3)
Sodium: 140 mEq/L (ref 135–145)
Total Protein: 5.7 g/dL — ABNORMAL LOW (ref 6.0–8.3)

## 2010-07-01 LAB — CBC WITH DIFFERENTIAL/PLATELET
BASO%: 0.2 % (ref 0.0–2.0)
HCT: 31.6 % — ABNORMAL LOW (ref 34.8–46.6)
MCHC: 33.5 g/dL (ref 31.5–36.0)
MONO#: 0.5 10*3/uL (ref 0.1–0.9)
RBC: 3.34 10*6/uL — ABNORMAL LOW (ref 3.70–5.45)
WBC: 9.1 10*3/uL (ref 3.9–10.3)
lymph#: 1.4 10*3/uL (ref 0.9–3.3)

## 2010-07-08 ENCOUNTER — Other Ambulatory Visit: Payer: Self-pay | Admitting: Physician Assistant

## 2010-07-08 ENCOUNTER — Encounter (HOSPITAL_BASED_OUTPATIENT_CLINIC_OR_DEPARTMENT_OTHER): Payer: Medicare Other | Admitting: Oncology

## 2010-07-08 DIAGNOSIS — E860 Dehydration: Secondary | ICD-10-CM

## 2010-07-08 DIAGNOSIS — Z171 Estrogen receptor negative status [ER-]: Secondary | ICD-10-CM

## 2010-07-08 DIAGNOSIS — Z5111 Encounter for antineoplastic chemotherapy: Secondary | ICD-10-CM

## 2010-07-08 DIAGNOSIS — E876 Hypokalemia: Secondary | ICD-10-CM

## 2010-07-08 DIAGNOSIS — C50919 Malignant neoplasm of unspecified site of unspecified female breast: Secondary | ICD-10-CM

## 2010-07-08 LAB — CBC WITH DIFFERENTIAL/PLATELET
BASO%: 0.9 % (ref 0.0–2.0)
EOS%: 2.7 % (ref 0.0–7.0)
LYMPH%: 33.3 % (ref 14.0–49.7)
MCHC: 33.6 g/dL (ref 31.5–36.0)
MONO#: 0.2 10*3/uL (ref 0.1–0.9)
Platelets: 139 10*3/uL — ABNORMAL LOW (ref 145–400)
RBC: 3.17 10*6/uL — ABNORMAL LOW (ref 3.70–5.45)
WBC: 3.3 10*3/uL — ABNORMAL LOW (ref 3.9–10.3)
lymph#: 1.1 10*3/uL (ref 0.9–3.3)
nRBC: 0 % (ref 0–0)

## 2010-07-15 ENCOUNTER — Other Ambulatory Visit: Payer: Self-pay | Admitting: Physician Assistant

## 2010-07-15 ENCOUNTER — Encounter (HOSPITAL_BASED_OUTPATIENT_CLINIC_OR_DEPARTMENT_OTHER): Payer: Medicare Other | Admitting: Oncology

## 2010-07-15 DIAGNOSIS — E860 Dehydration: Secondary | ICD-10-CM

## 2010-07-15 DIAGNOSIS — Z171 Estrogen receptor negative status [ER-]: Secondary | ICD-10-CM

## 2010-07-15 DIAGNOSIS — C50919 Malignant neoplasm of unspecified site of unspecified female breast: Secondary | ICD-10-CM

## 2010-07-15 DIAGNOSIS — D709 Neutropenia, unspecified: Secondary | ICD-10-CM

## 2010-07-15 DIAGNOSIS — E876 Hypokalemia: Secondary | ICD-10-CM

## 2010-07-15 DIAGNOSIS — Z5111 Encounter for antineoplastic chemotherapy: Secondary | ICD-10-CM

## 2010-07-15 LAB — CBC WITH DIFFERENTIAL/PLATELET
BASO%: 0.8 % (ref 0.0–2.0)
Basophils Absolute: 0 10*3/uL (ref 0.0–0.1)
EOS%: 4 % (ref 0.0–7.0)
HGB: 10.7 g/dL — ABNORMAL LOW (ref 11.6–15.9)
MCH: 32.1 pg (ref 25.1–34.0)
RBC: 3.33 10*6/uL — ABNORMAL LOW (ref 3.70–5.45)
RDW: 13.8 % (ref 11.2–14.5)
lymph#: 1.1 10*3/uL (ref 0.9–3.3)
nRBC: 0 % (ref 0–0)

## 2010-07-15 LAB — COMPREHENSIVE METABOLIC PANEL
ALT: 28 U/L (ref 0–35)
AST: 30 U/L (ref 0–37)
Albumin: 3.8 g/dL (ref 3.5–5.2)
Alkaline Phosphatase: 94 U/L (ref 39–117)
Glucose, Bld: 89 mg/dL (ref 70–99)
Potassium: 3.8 mEq/L (ref 3.5–5.3)
Sodium: 138 mEq/L (ref 135–145)
Total Bilirubin: 0.6 mg/dL (ref 0.3–1.2)
Total Protein: 5.8 g/dL — ABNORMAL LOW (ref 6.0–8.3)

## 2010-07-16 ENCOUNTER — Encounter (HOSPITAL_BASED_OUTPATIENT_CLINIC_OR_DEPARTMENT_OTHER): Payer: Medicare Other | Admitting: Oncology

## 2010-07-16 DIAGNOSIS — C50919 Malignant neoplasm of unspecified site of unspecified female breast: Secondary | ICD-10-CM

## 2010-07-17 NOTE — Op Note (Signed)
[name], [name]                       ACCOUNT NO.:  [id number]   MEDICAL RECORD NO.:  [id number]                   PATIENT TYPE:  AMB   LOCATION:  ENDO                                 FACILITY:  [facility]   PHYSICIAN:  Petra Kuba, M.D.                 DATE OF BIRTH:  [birth year]   DATE OF PROCEDURE:  02/14/2003  DATE OF DISCHARGE:                                 OPERATIVE REPORT   PROCEDURE:  Esophagogastroduodenoscopy with biopsy.   INDICATIONS FOR PROCEDURE:  Upper tract symptoms.   Consent was signed after risks, benefits, methods, and options were  thoroughly discussed in the office.   ADDITIONAL MEDICINES FOR THIS PROCEDURE:  1 of Versed only since it followed  the colonoscopy.   DESCRIPTION OF PROCEDURE:  The video endoscope was inserted by direct  vision, the esophagus was normal.  In the distal esophagus was a moderate  hiatal hernia with a widely patent ring.  The scope passed in the stomach  and advanced through the antrum through a normal pylorus into a normal  duodenal bulb and around the C loop to a normal second and probably third  part of the duodenum.  A few duodenal biopsies were obtained to rule out  malabsorption. The scope was withdrawn back to the bulb and a good look  there ruled out ulcers in that location.  The scope was withdrawn back to  the stomach and retroflexed.  High in the cardia, the hiatal hernia was  confirmed. The fundus, angularis, lesser and greater curve were evaluated on  retroflexion and straight visualization which confirms some mild gastritis  probably atrophic throughout but no obvious other findings were seen. A few  distal and proximal stomach biopsies were obtained to rule out any  helicobacter or other abnormalities. Air was suctioned, scope slowly  withdrawn. Again a good look at the esophagus confirmed a moderate hiatal  hernia and the ring  and ruled out any other esophageal abnormalities.  The  scope was removed.  The  patient tolerated the procedure well. There was no  obvious or immediate complications.   ENDOSCOPIC DIAGNOSIS:  1. Moderate hiatal hernia with widely patent ring.  2. Atrophic gastritis mild status post biopsy.  3. Otherwise normal EGD status post duodenal biopsy.   PLAN:  Await pathology, continue Nexium and Pamine. Followup p.r.n. or in  two months to recheck symptoms and make sure no further workup plans or  medicine trials are needed. Might consider something like Carafate or  Questran for her diarrhea at some point in the future.                                               Petra Kuba, M.D.    MEM/MEDQ  D:  02/14/2003  T:  02/15/2003  Job:  [id number]   cc:   Tera Mater. [name], M.D.  [address]  [place]  [place] [address]  Fax: [phone]

## 2010-07-17 NOTE — Op Note (Signed)
NAMEQUANTIA, GRULLON             ACCOUNT NO.:  [id number]   MEDICAL RECORD NO.:  [id number]          PATIENT TYPE:  AMB   LOCATION:  SDC                           FACILITY:  [facility]   PHYSICIAN:  Randye Lobo, M.D.   DATE OF BIRTH:  03-18-1934   DATE OF PROCEDURE:  06/08/2005  DATE OF DISCHARGE:                                 OPERATIVE REPORT   PREOPERATIVE DIAGNOSIS:  Genuine stress incontinence.   POSTOPERATIVE DIAGNOSIS:  Genuine stress incontinence.   PROCEDURE:  Tension-free vaginal tape suburethral sling and cystoscopy.   SURGEON:  Randye Lobo, M.D.   ASSISTANT:  Ilda Mori, M.D.   ANESTHESIA:  General endotracheal, local with 0.5% lidocaine with 1:200,000  epinephrine.   IV FLUIDS:  1400 mL Ringer's lactate.   ESTIMATED BLOOD LOSS:  75 mL.   URINE OUTPUT:  Quantity sufficient.   COMPLICATIONS:  None.   INDICATIONS FOR PROCEDURE:  The patient is a 76 year old para 2 Caucasian  female status post total abdominal hysterectomy and bilateral salpingo-  oophorectomy 30 years prior for uterine fibroids who presents with leakage  of urine with coughing and sneezing in addition to urinary urgency with urge  incontinence.  The patient has undergone a course of Detrol therapy which  has improved her urinary urgency but her stress incontinence has continued.  The patient did have multichannel urodynamic testing confirming genuine  stress incontinence with a leak point pressure of 47 cm of water.  She had a  minimal cystocele appreciated on pelvic exam.   The patient wishes for surgical treatment of her incontinence and a plan is  made to proceed with a TVT sling and cystoscopy after risks, benefits, and  alternatives were discussed with her.   FINDINGS:  Examination under anesthesia reveals good anterior vaginal wall  support.  The cervix is noted to be absent.  There is good apical vaginal  and posterior vaginal support.   Cystoscopy performed after placement of  the abdominal needle passers and  documents the absence of a foreign body in the bladder or the urethra.  The  bladder was visualized throughout 360 degrees and had a normal bladder dome  and trigone.  The ureters were noted to be patent bilaterally after the  injection of indigo carmine dye.   SPECIMENS:  None.   PROCEDURE:  The patient was reidentified in the preoperative hold area.  She  did receive clindamycin 900 mg intravenously for antibiotic prophylaxis.  She received both TED hose and PAS stockings for DVT prophylaxis.  In the  operating room, general endotracheal anesthesia was induced.  The patient  was placed in the dorsal lithotomy position and the lower abdomen, vagina  and perineum were then sterilely prepped and draped.  A Foley catheter was  placed in the bladder.   A weighted speculum was initially placed in the vagina but this did not help  to improve visualization, and it was therefore removed.  An Allis clamp was  placed approximately 1 cm below the urethral meatus in the midline and a  second Allis clamp was placed 3 cm below this  in the midline.  The vaginal  mucosa was injected with 0.5% lidocaine with 1:200,000 of epinephrine.  The  anterior vaginal wall was then incised vertically with the scalpel.  The  vaginal mucosa was dissected off of the underlying bladder bilaterally back  to the level of the pubic rami using a combination of sharp and blunt  dissection.  The bladder was dissected off of the vaginal mucosa in the  midline as well closer to the base of the bladder.   The suprapubic sites for the TVT were then marked approximately 2.5 cm to  the right and to the left of the midline overlying the pubic symphysis.  The  incisions were created sharply with the scalpel.  The TVT was then performed  in a top-down fashion.  The TVT abdominal needle passer was first placed  through the right suprapubic incision and then out through the vagina at the  level of  the mid urethra and lateral to it.  The same procedure that was  performed on the right was then repeated on the left hand side, again,  performing the TVT in a top-down fashion.  The Foley catheter was removed  and cystoscopy was performed and the findings were as noted above.  The  Foley catheter was replaced in the bladder at this time.  There was some  clear colorless fluid which was noted near the left suprapubic incision and  a decision was made to remove the Foley catheter and once again perform  cystoscopy.  There was no evidence of a foreign body in the bladder or the  urethra.  There was indigo carmine stained urine in the bladder at this  point.  The cystoscope was removed and the Foley catheter was replaced and  the bladder was completely drained.  There was no further drainage from any  of the suprapubic incisions.  The sling was attached to the needle passers  and the sling was drawn up through the suprapubic incisions bilaterally.  The Kelly clamp was placed underneath the sling in the midline and the  plastic sheaths were withdrawn from the surrounding sling.  Excess sling was  then trimmed suprapubically.  Two small mattress sutures of 2-0 Vicryl were  placed below the suburethral sling in order to give some support to the  bladder in this area.  Excess vaginal mucosa was trimmed and the anterior  vaginal wall was then closed with figure-of-eight sutures of 2-0 Vicryl.  Hemostasis was noted to be good.   The suprapubic incisions were closed with subcuticular suture of 3-0 Vicryl.  Steri-Strips were placed over the incision.  A plain gauze packing was  placed inside the vagina and the Foley catheter was left to gravity  drainage.  This concluded the patient's surgery.  There were no  complications.  All needle, instrument, and sponge counts were correct.      Randye Lobo, M.D.  Electronically Signed    BES/MEDQ  D:  06/08/2005  T:  06/08/2005  Job:  [id number]

## 2010-07-17 NOTE — Consult Note (Signed)
NAMEMARDY, HOPPE NO.:  [id number]   MEDICAL RECORD NO.:  [id number]          PATIENT TYPE:  OIB   LOCATION:  5015                         FACILITY:  [facility]   PHYSICIAN:  Vesta Mixer, M.D. DATE OF BIRTH:  05/21/1934   DATE OF CONSULTATION:  01/15/2004  DATE OF DISCHARGE:                                   CONSULTATION   [name] is a 76 year old female with a history of hypothyroidism and  back surgery.  She was noted to have some EKG abnormalities and we were  asked to see.   The patient has been relatively healthy.  She has not had any cardiac  complaints.  She does describe some episodes of indigestion many years ago.  She has not gotten any regular exercise.  She has been taking care of her  elderly mother which takes most of her time.  She has not been on any sort  of regular walking program.  She has not had any episodes of chest pain.  She denies any orthopnea or PND.  She does have some mild dyspnea if she  tries to rush too fast.   She had surgery today on her back.  Preoperative EKG was noted to have  inferior Q-waves.  We were asked to comment on these Q-waves.   CURRENT MEDICATIONS:  1.  Synthroid.  2.  Vytorin.  3.  Hydrochlorothiazide once a day.   ALLERGIES:  She is intolerant to PENICILLIN and CODEINE.   PAST MEDICAL HISTORY:  1.  Nodular goiter.  2.  Hypercholesterolemia.  3.  Hypertension.   SOCIAL HISTORY:  The patient is a former smoker but quit around five years  ago.  She use to work in [employer].   FAMILY HISTORY:  Negative for cardiac disease.   PHYSICAL EXAMINATION:  GENERAL APPEARANCE:  She is an elderly female in no  acute distress.  She is quite comfortable in bed following her surgery.  VITAL SIGNS:  Stable.  HEENT:  There are 2+ carotids.  She has no JVD and no thyromegaly.  LUNGS:  Clear to auscultation.  CARDIOVASCULAR:  Regular rate, S1 and S2 with no murmurs.  ABDOMEN:  Good bowel sounds and  nontender.  EXTREMITIES:  No clubbing, cyanosis, or edema.   EKG reveals normal sinus rhythm.  She has small Q-waves in the inferior  leads.  The other leads look normal.   Her initial CPK-MBs are normal.   IMPRESSION:  The patient presents with an abnormal EKG.  She has abnormal Q-  waves in the inferior leads.  There is a good possibility that she has had a  silent inferior wall myocardial infarction.  These are fairly common and I  would expect her to do quite well if she has not had any problems following  her laminectomy.   It is possible that she had a small, fairly silent myocardial infarction  several years ago and she had the episodes of indigestion.   At this point I think it is safe for her to be discharged tomorrow.  We will  get an echocardiogram  as an outpatient.  We will see her in the office  following this to arrange further cardiac work-up.  She may need a stress  Cardiolite study  after she heals up from her back.   Thank you very much for this consultation.       PJN/MEDQ  D:  01/15/2004  T:  01/16/2004  Job:  [id number]   cc:   Tera Mater. [name], M.D.  [address]  [place]  [place] [address]  Fax: [phone]

## 2010-07-17 NOTE — Discharge Summary (Signed)
[name], [name]             ACCOUNT NO.:  [id number]   MEDICAL RECORD NO.:  [id number]          PATIENT TYPE:  INP   LOCATION:  9315                          FACILITY:  [facility]   PHYSICIAN:  Randye Lobo, M.D.   DATE OF BIRTH:  [birth year]   DATE OF ADMISSION:  06/08/2005  DATE OF DISCHARGE:  06/10/2005                                 DISCHARGE SUMMARY   ADMISSION DIAGNOSES:  1.  Genuine stress incontinence.   DISCHARGE DIAGNOSIS:  1.  Genuine Stress Incontinence  2.  Postoperative dehydration.   SIGNIFICANT OPERATIONS/PROCEDURES:  The patient underwent a tension-free  vaginal tape suburethral sling and cystoscopy under the direction of Dr.  Conley Simmonds with the assistance of Dr. Ilda Mori at the [facility] [place] on June 08, 2005.   ADMISSION HISTORY AND PHYSICAL EXAMINATION:  The patient is a 76 year old  para 2 Caucasian female, status post total abdominal hysterectomy with  bilateral salpingo-oophorectomy several years prior, who presented to the  office with a complaint of urinary leakage with coughing and sneezing, in  addition to urinary urgency with urge incontinence.  The patient had some  improvement of her urinary incontinence after the initiation of Detrol  therapy; however, she continued to have leakage of urine with coughing and  sneezing.  The patient wished for surgical treatment.   On pelvic examination, the patient was noted to have a minimal cystocele.  There was evidence of good vaginal apical and posterior walls support.  The  cervix and uterus were noted to be absent.   Preoperative multichannel urodynamic testing documented the presence of  genuine stress incontinence with a leak point pressure of 47 cm of water.   HOSPITAL COURSE:  The patient was admitted on June 08, 2005 at which time  she underwent a tension-free vaginal tape with cystoscopy, which was  performed without complications.  The patient's intraoperative  cystoscopy  documented the absence of a foreign body in the bladder and urethra during  the procedure.  The ureters were noted to be patent bilaterally.   Postoperatively, the patient had her pain controlled with a morphine PCA and  Toradol.  She had good urine output and was stable overnight following her  surgery.   On postoperative day #1, her Foley catheter was removed and the patient's  PCA was discontinued and she was started on oral Percocet.  The patient  began to have nausea and vomiting which was attributed to the Percocet.  She  had decreased urine output and upon further questioning was found to have  done an enema prior to her surgery.  The patient was noted to have dry  mucous membranes and had evidence of dehydration.  Her IV fluids were  restarted and her Foley catheter was placed overnight.  By the next morning  of postoperative day #2, the patient had good urine output overnight and was  feeling much better and her nausea had resolved with the discontinuation of  the Percocet.  She was treated with Darvocet-N 100 which controlled her pain  well.   The patient was able to tolerate  a regular diet and she was ambulating  independently.  Her incisions demonstrated some mild ecchymotic regions  without induration.   The patient's discharge hemoglobin was 10.8 and she was tolerating this  well.   The patient resumed her voiding trials on postoperative day #2 and she was  voiding adequately with low postvoid residuals and was therefore discharged  without a Foley catheter.   The patient is found to be in good condition and ready for discharge on  postoperative day #2.   DISCHARGE INSTRUCTIONS:  1.  Discharged to home.  2.  The patient will take the following medications, Darvocet-N 100 one p.o.      q.4-6h. p.r.n. pain.  The patient will also take Macrodantin 100 mg p.o.      q.h.s. x7 days for prophylaxis against urinary tract infection.  3.  The patient will follow  a regular diet.  4.  The patient will have decreased activity for the next 6 weeks.  5.  The patient will follow up in the office for postoperative checks in 4      weeks.  She will call the office if she experiences any problems with      fever, nausea and vomiting, pain uncontrolled by her medications, active      vaginal bleeding, difficulty voiding, or any other concerns.      Randye Lobo, M.D.  Electronically Signed     BES/MEDQ  D:  07/07/2005  T:  07/09/2005  Job:  [id number]

## 2010-07-17 NOTE — H&P (Signed)
[name], [name]             ACCOUNT NO.:  [id number]   MEDICAL RECORD NO.:  [id number]          PATIENT TYPE:  AMB   LOCATION:  SDC                           FACILITY:  [facility]   PHYSICIAN:  Randye Lobo, M.D.   DATE OF BIRTH:  30-Jun-1934   DATE OF ADMISSION:  DATE OF DISCHARGE:                                HISTORY & PHYSICAL   CHIEF COMPLAINT:  Urinary incontinence.   HISTORY OF PRESENT ILLNESS:  The patient is a 76 year old para 2 Caucasian  female, status post total abdominal hysterectomy with bilateral salpingo-  oophorectomy 30 years ago for uterine fibroids, who presented with leakage  of urine with coughing and sneezing in addition to urinary urgency with urge  incontinence.  The patient reported the Kegel exercises were not of any  help.  The patient has a history of urinary tract infections.  She has not  had any prior bladder surgeries.  The patient did have some improvement of  her urinary incontinence with Detrol LA 4 mg.  Her urinary leakage with  coughing and sneezing continued, and she therefore underwent multichannel  urodynamic testing, which did document the presence of genuine stress  incontinence with a leak point pressure of 47 cm of water.  The patient now  wishes for surgical treatment of the incontinence.   PAST OBSTETRIC/GYNECOLOGIC HISTORY:  The patient is status post vaginal  delivery x2.  She is status post total abdominal hysterectomy with bilateral  salpingo-oophorectomy for uterine fibroids and infection 30 years ago.  She  is also status post abdominal myomectomy 28 years ago.  The patient does  have a history of breast cancer and is status post left mastectomy 11 years  ago.  Her last Pap smear was performed on January 26, 2005 and was within  normal limits.   PAST MEDICAL HISTORY:  1.  Left breast cancer:  The patient is status post mastectomy 11 years ago.  2.  Fibromyalgia.  3.  Osteoporosis.  4.  Gastroesophageal reflux disease.  5.   Irritable bowel syndrome.  6.  Hypothyroidism.  7.  History of urinary tract infections.   PAST SURGICAL HISTORY:  1.  Status post total abdominal hysterectomy with bilateral salpingo-      oophorectomy.  2.  Status post abdominal myomectomy.  3.  Status post cholecystectomy.  4.  Status post left mastectomy.  5.  Status post spinal surgery for a spinal cyst of L4-5.   MEDICATIONS:  1.  Synthroid 100 mcg p.o. daily.  2.  Nexium 40 mg p.o. daily.  3.  Prozac 40 mg p.o. daily.  4.  Vytorin 10/40 1 p.o. daily.  5.  Fosamax Plus D.  6.  Pentasa 500 mg p.o. q.i.d.  7.  Hydrochlorothiazide 25 mg p.o. daily.  8.  Naprosyn 500 mg p.o. b.i.d.   ALLERGIES:  PENICILLIN.   SOCIAL HISTORY:  The patient is married.  She denies the use of tobacco,  alcohol, or illicit drugs.   FAMILY HISTORY:  Positive for ovarian cancer in the patient's sister.  Positive for coronary artery disease in a maternal grandfather.  Positive  for hypertension in the patient's mother and sister.  Positive for  hyperlipidemia in the patient's mother, sister, maternal aunts, maternal  uncles.   REVIEW OF SYSTEMS:  The patient denies any history of constipation.   PHYSICAL EXAMINATION:  VITAL SIGNS:  Height 5 feet 7-1/2 inches.  Weight 162  pounds.  Blood pressure 120/60.  HEENT:  Normocephalic and atraumatic.  LUNGS:  Clear to auscultation bilaterally.  HEART:  S1 and S2 with a regular rate and rhythm.  ABDOMEN:  Soft and nontender without evidence of hepatosplenomegaly or  organomegaly.  PELVIC:  Normal external genitalia and urethra.  There is a minimal  cystocele appreciated.  There is good vaginal wall and apical support.  The  cervix and uterus are noted to be absent.  There is no evidence of adnexal  masses or tenderness.   IMPRESSION:  The patient is a 76 year old para 2 female with urodynamically  proven genuine stress incontinence.   PLAN:  The patient will undergo a tension-free vaginal tape, mid  urethral  sling, and cystoscopy at the [facility] [place] on June 08, 2005.  Risks, benefits and alternatives have been discussed with the  patient, who wishes to proceed.      Randye Lobo, M.D.  Electronically Signed     BES/MEDQ  D:  06/07/2005  T:  06/07/2005  Job:  [id number]

## 2010-07-17 NOTE — Op Note (Signed)
[name], [name]             ACCOUNT NO.:  [id number]   MEDICAL RECORD NO.:  [id number]          PATIENT TYPE:  OIB   LOCATION:  5015                         FACILITY:  [facility]   PHYSICIAN:  Stefani Dama, M.D.  DATE OF BIRTH:  1934-05-02   DATE OF PROCEDURE:  01/14/2004  DATE OF DISCHARGE:                                 OPERATIVE REPORT   PREOPERATIVE DIAGNOSIS:  L4-5 lumbar spondylosis with stenosis, left lumbar  radiculopathy.   POSTOPERATIVE DIAGNOSIS:  L4-5 lumbar spondylosis with stenosis, left lumbar  radiculopathy.   PROCEDURE:  Bilateral laminotomies and foraminotomies, L4, L5, with  operating microscope, microdissection technique.   SURGEON:  Stefani Dama, M.D.   FIRST ASSISTANT:  Clydene Fake, M.D.   ANESTHESIA:  General endotracheal.   INDICATIONS:  The patient is a 77 year old individual who has had  significant back and bilateral leg pain with a left lumbar radiculopathy  being predominant.  There is evidence of severe spondylitic disease with  bilateral facetal overgrowth causing left-sided compression of the L5 nerve  root.  There was also right-sided compression secondary to significant  spondylosis.  The patient has had no relief despite extensive treatment for  the last year or so, and the pain has now become so severe, she cannot  tolerate it any more.   PROCEDURE:  The patient was brought to the operating room supine on a  stretcher.  After the smooth induction of general endotracheal anesthesia,  she was turned prone.  The back was prepped with Duraprep and draped in a  sterile fashion.  A midline incision was created in the lower lumbar spine  in an area corresponding to the interlaminar space of L4-5.  The lumbar  dorsal fascia was opened on the left side and the subperiosteal dissection  was performed in the first palpable interlaminar space.  This was identified  positively as L4-5 with a radiograph.  Then the right side was opened in  a  similar fashion and a sublaminar dissection was performed here.  The self-  retaining retractor was then placed in the wound and the inferior margin of  the lamina of L4 was cleared, and a high-speed air drill was used to perform  a laminotomy on the left side at L4-5.  Thickened, redundant yellow ligament  was taken up in this area, and this identified a compressed dural tube, and  as the decompression was undertaken, the operating microscope was used to  help facilitate visualization in this area.  The ligament was taken up with  a 2 mm Kerrison punch, alternating with the use of a high-speed air drill to  remove bony lamina and facet.  Ultimately in the end, the common dural tube  was freed and cleared of the overgrown ligamentous material, which was quite  grumous and thick.  The exiting L5 nerve root was identified, protected, and  retracted medially while the rest of the laminotomy was then created.  Then  the right side was decompressed in a similar fashion.  Here the ligament was  thickened, but there was no clearly cystic structure identified within  the  ligament.  The exiting L5 nerve root was narrowed in the approach to the  foramen, and this area was well-decompressed.  Once this was all  accomplished, the microscope was removed.  The area was copiously irrigated  with antibiotic irrigating solution.  Hemostasis in the soft tissues was  meticulously obtained.  Depo-Medrol 40 mg along with 50 mcg of fentanyl was  left in the epidural space over each laminotomy  site.  The lumbar dorsal fascia was then closed with #1 Vicryl in an  interrupted fashion and 2-0 Vicryl was used in the subcutaneous and  subcuticular tissue.  Dermabond was placed on the skin.  The patient  tolerated the procedure well.       HJE/MEDQ  D:  01/14/2004  T:  01/15/2004  Job:  [id number]

## 2010-07-17 NOTE — Op Note (Signed)
[name], [name]                       ACCOUNT NO.:  [id number]   MEDICAL RECORD NO.:  [id number]                   PATIENT TYPE:  AMB   LOCATION:  ENDO                                 FACILITY:  [facility]   PHYSICIAN:  Petra Kuba, M.D.                 DATE OF BIRTH:  05/17/1934   DATE OF PROCEDURE:  02/14/2003  DATE OF DISCHARGE:                                 OPERATIVE REPORT   PROCEDURE:  Colonoscopy with polypectomy and biopsy.   INDICATIONS FOR PROCEDURE:  Diarrhea in a patient due for colonic screening.   Consent was signed after risks, benefits, methods, and options were  thoroughly discussed in the office.   MEDICINES USED:  Demerol 80, Versed 7.   DESCRIPTION OF PROCEDURE:  Rectal inspection was pertinent for external  hemorrhoids, small. Digital exam was negative. The pediatric video  adjustable colonoscope was inserted, easily advanced around the colon to the  cecum. This did require some abdominal pressure but no position changes.  On  insertion, a small pedunculated mid sigmoid polyp was seen but no other  abnormalities. The cecum was identified by the appendiceal orifice and the  ileocecal valve. The scope was inserted a short ways into the terminal ileum  which was normal. Photo documentation and random biopsies were obtained and  put in the first container. The scope was slowly withdrawn. The prep was  adequate. There was some liquid stool that required washing and suctioning.  Random colon biopsies were obtained and put in the second container.  On  slow withdrawal through the colon, the cecum, ascending and transverse was  normal.  There was a rare sigmoid early diverticula seen.  The scope was  slowly withdrawn back to the mid sigmoid where the polyp seen on insertion  was seen, snared, electrocautery applied and the polyp was suctioned through  the scope and collected in the trap.  There was a rare tiny rectal and  distal sigmoid probably hyperplastic  appearing polyp which were also hot  biopsied and put in the third container.  A few more random cold biopsies of  the rectum and distal sigmoid were obtained and put in the second container.  Anal rectal pull through and retroflexion confirmed some small hemorrhoids.  The scope was reinserted a short ways up the left side of the colon, air was  suctioned, scope removed.  The patient tolerated the procedure well. There  was no obvious or immediate complications.   ENDOSCOPIC DIAGNOSIS:  1. Internal and external hemorrhoids.  2. Rare early left sided diverticula.  3. A few tiny rectal and distal sigmoid probably hyperplastic appearing     polyps hot biopsied.  4. Small mid sigmoid polyp snared.  5. Otherwise within normal limits to the terminal ileum with random biopsies     of that area and the colon.   PLAN:  Await pathology to rule out microscopic colitis and determine  future  colonic screening.  Continue workup with an EGD. Please see that dictation  for further workup and plans.                                               Petra Kuba, M.D.    MEM/MEDQ  D:  02/14/2003  T:  02/15/2003  Job:  [id number]   cc:   Jeannett Senior A. [name], M.D.  [address]  [place]  [place] [address]  Fax: [phone]

## 2010-07-17 NOTE — Discharge Summary (Signed)
NAMECHAMIA, [name]             ACCOUNT NO.:  [id number]   MEDICAL RECORD NO.:  [id number]          PATIENT TYPE:  INP   LOCATION:  5015                         FACILITY:  [facility]   PHYSICIAN:  Stefani Dama, M.D.  DATE OF BIRTH:  10/25/1934   DATE OF ADMISSION:  01/14/2004  DATE OF DISCHARGE:  01/16/2004                                 DISCHARGE SUMMARY   ADMISSION DIAGNOSES:  Lumbar spondylosis and stenosis, with lumbar  radiculopathy -- L4-L5.   DISCHARGE AND FINAL DIAGNOSES:  1.  Lumbar spondylosis and stenosis, with lumbar radiculopathy L4-L5.  2.  Coronary artery disease.   CONDITION ON DISCHARGE:  Stable.   HOSPITAL COURSE:  Ms. [name] is a 76 year old individual who has  had significant back and lower extremity pain, involving both lower  extremities (left worse than the right).  She was found to have severe  lateral recess stenosis at L4-L5, and she was advised regarding surgical  decompression.  This was performed on January 14, 2004.   Postoperatively, the patient was noted to have some questionable Q-wave  changes in her EKG; however, when compared to her preoperative EKG this was  also noted.  Because of the concern, a cardiology consult was obtained.  The  patient was seen by Vesta Mixer, M.D.  Initial blood studies revealed  that there is no evidence of an acute event, with normal enzyme studies.  The patient was followed on a cardiac monitor for the two days that she was  in the hospital, and no other abnormalities were noted.   She remained hemodynamically stable and she had good relief of her back and  leg pain.  She was ambulated and was independent at the time of discharge.  She was given a prescription for Percocet as needed for pain, and some  Flexeril as a muscle relaxer.   Her incision remained clean and dry.   FOLLOWUP:  She will be seen in follow-up at the office of Dr. Elease Hashimoto in a week or two,  for a cardiac followup.  She will  be seen by me in approximately three weeks for further follow-up.   CONDITION ON DISCHARGE:  Improved.      Henr   HJE/MEDQ  D:  03/05/2004  T:  03/05/2004  Job:  [id number]

## 2010-07-20 ENCOUNTER — Encounter: Payer: Self-pay | Admitting: Pulmonary Disease

## 2010-07-21 ENCOUNTER — Ambulatory Visit: Payer: Medicare Other | Admitting: Pulmonary Disease

## 2010-07-28 ENCOUNTER — Ambulatory Visit (INDEPENDENT_AMBULATORY_CARE_PROVIDER_SITE_OTHER): Payer: Medicare Other | Admitting: Pulmonary Disease

## 2010-07-28 ENCOUNTER — Encounter: Payer: Self-pay | Admitting: Pulmonary Disease

## 2010-07-28 DIAGNOSIS — R222 Localized swelling, mass and lump, trunk: Secondary | ICD-10-CM

## 2010-07-28 DIAGNOSIS — R05 Cough: Secondary | ICD-10-CM

## 2010-07-28 NOTE — Progress Notes (Signed)
  Subjective:    Patient ID: [name], female    DOB: 1934-08-07, 76 y.o.   MRN: [id number]  HPI The pt comes in today for f/u of her known pulmonary nodules/masses, and also cough.  Her cough has resolved with treatment of PND and behavioral therapies for upper airway cough.  She has had worsening postnasal drip lately, and has had blood tinged material from her nose and coughed out of back of throat.  She denies any chest congestion, or mucus from her chest.  She has nearly completed her chemo for breast cancer, and is due for her ct f/u.     Review of Systems  Constitutional: Negative for fever and unexpected weight change.  HENT: Positive for nosebleeds and rhinorrhea. Negative for ear pain, congestion, sore throat, sneezing, trouble swallowing, dental problem, postnasal drip and sinus pressure.   Eyes: Positive for redness and itching.  Respiratory: Positive for cough. Negative for chest tightness, shortness of breath and wheezing.   Cardiovascular: Negative for palpitations and leg swelling.  Gastrointestinal: Positive for nausea and vomiting.  Genitourinary: Negative for dysuria.  Musculoskeletal: Negative for joint swelling.  Skin: Negative for rash.  Neurological: Negative for headaches.  Hematological: Bruises/bleeds easily.  Psychiatric/Behavioral: Negative for dysphoric mood. The patient is not nervous/anxious.        Objective:   Physical Exam Wd female in nad Nares patent without discharge, but crusting and dried blood along septum on left OP without lesions or other abnormalities. Chest totally clear to auscultation Cor with rrr LE without edema, no cyanosis Alert and oriented, moves all 4        Assessment & Plan:

## 2010-07-28 NOTE — Assessment & Plan Note (Addendum)
The pt is nearing completion of her chemo for breast cancer, and is due for her 6mos ct for f/u of her previously evaluated pulmonary densities.  Will set this up.

## 2010-07-28 NOTE — Assessment & Plan Note (Signed)
Upper airway in origin, and much improved with treatment of postnasal drip and behavioral therapies.

## 2010-07-28 NOTE — Patient Instructions (Signed)
Will set up for ct chest to followup the prior abnormalities.  Will call you with results.  Saline nasal spray 3-4 times a day for the next 2 weeks to see if helps dryness.  Can take the antihistamine for your postnasal drip. followup with me in 6mos, but may change based on ct results.

## 2010-07-29 ENCOUNTER — Other Ambulatory Visit: Payer: Self-pay | Admitting: Physician Assistant

## 2010-07-29 ENCOUNTER — Encounter (HOSPITAL_BASED_OUTPATIENT_CLINIC_OR_DEPARTMENT_OTHER): Payer: Medicare Other | Admitting: Oncology

## 2010-07-29 DIAGNOSIS — Z5111 Encounter for antineoplastic chemotherapy: Secondary | ICD-10-CM

## 2010-07-29 DIAGNOSIS — C50919 Malignant neoplasm of unspecified site of unspecified female breast: Secondary | ICD-10-CM

## 2010-07-29 LAB — COMPREHENSIVE METABOLIC PANEL
ALT: 13 U/L (ref 0–35)
AST: 21 U/L (ref 0–37)
Calcium: 8.8 mg/dL (ref 8.4–10.5)
Chloride: 104 mEq/L (ref 96–112)
Creatinine, Ser: 0.53 mg/dL (ref 0.40–1.20)
Sodium: 139 mEq/L (ref 135–145)
Total Bilirubin: 0.4 mg/dL (ref 0.3–1.2)

## 2010-07-29 LAB — CBC WITH DIFFERENTIAL/PLATELET
BASO%: 0.2 % (ref 0.0–2.0)
EOS%: 0.6 % (ref 0.0–7.0)
HCT: 32.6 % — ABNORMAL LOW (ref 34.8–46.6)
MCH: 31.8 pg (ref 25.1–34.0)
MCHC: 33.7 g/dL (ref 31.5–36.0)
NEUT%: 79 % — ABNORMAL HIGH (ref 38.4–76.8)
RBC: 3.46 10*6/uL — ABNORMAL LOW (ref 3.70–5.45)
lymph#: 1.5 10*3/uL (ref 0.9–3.3)

## 2010-07-31 ENCOUNTER — Ambulatory Visit (INDEPENDENT_AMBULATORY_CARE_PROVIDER_SITE_OTHER)
Admission: RE | Admit: 2010-07-31 | Discharge: 2010-07-31 | Disposition: A | Discharge status: Home / Self Care | Payer: Medicare Other | Source: Ambulatory Visit | Attending: Pulmonary Disease | Admitting: Pulmonary Disease

## 2010-07-31 ENCOUNTER — Telehealth: Payer: Self-pay | Admitting: Pulmonary Disease

## 2010-07-31 DIAGNOSIS — R222 Localized swelling, mass and lump, trunk: Secondary | ICD-10-CM

## 2010-07-31 NOTE — Telephone Encounter (Signed)
Discussed with pt at length.

## 2010-07-31 NOTE — Telephone Encounter (Signed)
Pt calling for CT results. KC, please advise.  Thank you.

## 2010-08-03 NOTE — Telephone Encounter (Signed)
Will sign off on message per triage protocol.

## 2010-08-05 ENCOUNTER — Other Ambulatory Visit: Payer: Self-pay | Admitting: Physician Assistant

## 2010-08-05 ENCOUNTER — Encounter (HOSPITAL_BASED_OUTPATIENT_CLINIC_OR_DEPARTMENT_OTHER): Payer: Medicare Other | Admitting: Oncology

## 2010-08-05 DIAGNOSIS — Z5111 Encounter for antineoplastic chemotherapy: Secondary | ICD-10-CM

## 2010-08-05 DIAGNOSIS — C50919 Malignant neoplasm of unspecified site of unspecified female breast: Secondary | ICD-10-CM

## 2010-08-05 LAB — COMPREHENSIVE METABOLIC PANEL
ALT: 17 U/L (ref 0–35)
CO2: 27 mEq/L (ref 19–32)
Calcium: 8.6 mg/dL (ref 8.4–10.5)
Chloride: 104 mEq/L (ref 96–112)
Creatinine, Ser: 0.52 mg/dL (ref 0.50–1.10)
Sodium: 137 mEq/L (ref 135–145)
Total Protein: 5.5 g/dL — ABNORMAL LOW (ref 6.0–8.3)

## 2010-08-05 LAB — CBC WITH DIFFERENTIAL/PLATELET
BASO%: 0.9 % (ref 0.0–2.0)
EOS%: 1.4 % (ref 0.0–7.0)
HCT: 33.1 % — ABNORMAL LOW (ref 34.8–46.6)
LYMPH%: 27.3 % (ref 14.0–49.7)
MCH: 31.1 pg (ref 25.1–34.0)
MCHC: 32.9 g/dL (ref 31.5–36.0)
NEUT%: 61.9 % (ref 38.4–76.8)
Platelets: 152 10*3/uL (ref 145–400)

## 2010-08-12 ENCOUNTER — Encounter (HOSPITAL_BASED_OUTPATIENT_CLINIC_OR_DEPARTMENT_OTHER): Payer: Medicare Other | Admitting: Oncology

## 2010-08-12 ENCOUNTER — Other Ambulatory Visit: Payer: Self-pay | Admitting: Physician Assistant

## 2010-08-12 DIAGNOSIS — C50419 Malignant neoplasm of upper-outer quadrant of unspecified female breast: Secondary | ICD-10-CM

## 2010-08-12 DIAGNOSIS — Z171 Estrogen receptor negative status [ER-]: Secondary | ICD-10-CM

## 2010-08-12 DIAGNOSIS — Z5111 Encounter for antineoplastic chemotherapy: Secondary | ICD-10-CM

## 2010-08-12 DIAGNOSIS — C50919 Malignant neoplasm of unspecified site of unspecified female breast: Secondary | ICD-10-CM

## 2010-08-12 LAB — CBC WITH DIFFERENTIAL/PLATELET
BASO%: 1.6 % (ref 0.0–2.0)
MCHC: 33.1 g/dL (ref 31.5–36.0)
MONO#: 0.3 10*3/uL (ref 0.1–0.9)
RBC: 3.48 10*6/uL — ABNORMAL LOW (ref 3.70–5.45)
RDW: 13.2 % (ref 11.2–14.5)
WBC: 2.6 10*3/uL — ABNORMAL LOW (ref 3.9–10.3)
lymph#: 1.1 10*3/uL (ref 0.9–3.3)
nRBC: 0 % (ref 0–0)

## 2010-08-26 ENCOUNTER — Encounter (HOSPITAL_BASED_OUTPATIENT_CLINIC_OR_DEPARTMENT_OTHER): Payer: Medicare Other | Admitting: Oncology

## 2010-08-26 ENCOUNTER — Other Ambulatory Visit: Payer: Self-pay | Admitting: Physician Assistant

## 2010-08-26 DIAGNOSIS — Z5111 Encounter for antineoplastic chemotherapy: Secondary | ICD-10-CM

## 2010-08-26 DIAGNOSIS — C50919 Malignant neoplasm of unspecified site of unspecified female breast: Secondary | ICD-10-CM

## 2010-08-26 DIAGNOSIS — C50419 Malignant neoplasm of upper-outer quadrant of unspecified female breast: Secondary | ICD-10-CM

## 2010-08-26 LAB — CBC WITH DIFFERENTIAL/PLATELET
Basophils Absolute: 0 10*3/uL (ref 0.0–0.1)
EOS%: 1.8 % (ref 0.0–7.0)
Eosinophils Absolute: 0.2 10*3/uL (ref 0.0–0.5)
HGB: 11.9 g/dL (ref 11.6–15.9)
MCH: 29.9 pg (ref 25.1–34.0)
NEUT#: 5.8 10*3/uL (ref 1.5–6.5)
RDW: 13.5 % (ref 11.2–14.5)
lymph#: 1.3 10*3/uL (ref 0.9–3.3)

## 2010-08-27 ENCOUNTER — Other Ambulatory Visit: Payer: Self-pay | Admitting: Oncology

## 2010-08-27 DIAGNOSIS — C50919 Malignant neoplasm of unspecified site of unspecified female breast: Secondary | ICD-10-CM

## 2010-09-01 ENCOUNTER — Ambulatory Visit (HOSPITAL_COMMUNITY)
Admission: RE | Admit: 2010-09-01 | Discharge: 2010-09-01 | Disposition: A | Discharge status: Home / Self Care | Payer: Medicare Other | Source: Ambulatory Visit | Attending: Oncology | Admitting: Oncology

## 2010-09-01 DIAGNOSIS — C50919 Malignant neoplasm of unspecified site of unspecified female breast: Secondary | ICD-10-CM

## 2010-09-01 DIAGNOSIS — C50419 Malignant neoplasm of upper-outer quadrant of unspecified female breast: Secondary | ICD-10-CM | POA: Insufficient documentation

## 2010-09-01 DIAGNOSIS — D0590 Unspecified type of carcinoma in situ of unspecified breast: Secondary | ICD-10-CM | POA: Insufficient documentation

## 2010-09-01 DIAGNOSIS — K7689 Other specified diseases of liver: Secondary | ICD-10-CM | POA: Insufficient documentation

## 2010-09-01 MED ORDER — GADOBENATE DIMEGLUMINE 529 MG/ML IV SOLN
15.0000 mL | Freq: Once | INTRAVENOUS | Status: AC | PRN
  Administered 2010-09-01: 15 mL via INTRAVENOUS

## 2010-09-07 ENCOUNTER — Other Ambulatory Visit (INDEPENDENT_AMBULATORY_CARE_PROVIDER_SITE_OTHER): Payer: Self-pay | Admitting: Surgery

## 2010-09-07 ENCOUNTER — Ambulatory Visit (INDEPENDENT_AMBULATORY_CARE_PROVIDER_SITE_OTHER): Payer: Medicare Other | Admitting: Surgery

## 2010-09-07 ENCOUNTER — Encounter (INDEPENDENT_AMBULATORY_CARE_PROVIDER_SITE_OTHER): Payer: Self-pay | Admitting: Surgery

## 2010-09-07 DIAGNOSIS — C50919 Malignant neoplasm of unspecified site of unspecified female breast: Secondary | ICD-10-CM

## 2010-09-07 DIAGNOSIS — C50911 Malignant neoplasm of unspecified site of right female breast: Secondary | ICD-10-CM

## 2010-09-07 NOTE — Patient Instructions (Signed)
We will schedule surgery = a lumpectomy and sentinel node evaluation and remove your port.

## 2010-09-07 NOTE — Progress Notes (Signed)
CC: [Breast cancer now finished neoadjuvant chemotherapy] HPI: This patient was diagnosed with a right breast cancer, invasive ductal triple negative back in November. She has been undergoing neoadjuvant chemotherapy. However she is now finished that because of side effects she has not completed all that had been planned. She is now ready to have surgery. She overall feels like she is doing well currently.  ROS: I have reviewed all the past history family history of such are noted in it.  MEDS:  ALLERGIES:  GENERAL: The patient is alert, oriented, and generally healthy-appearing, NAD. Mood and affect are normal.  HEENT: The head is normocephalic, the eyes nonicteric, the pupils were round regular and equal. EOMs are normal. Pharynx normal. Dentition good. Hair loss  NECK: The neck is supple and there are no masses or thyromegaly.  LUNGS: Normal respirations and clear to auscultation.  HEART: Regular rhythm, with no murmurs rubs or gallops. Pulses are intact carotid dorsalis pedis and posterior tibial. No significant varicosities are noted.  BREASTS: The right breast shows complete resolution of the previously noted mass. Left breast is status post central partial mastectomy for a remote breast cancer. The breast appears free of disease.  LYMPHATICS: There is no evidence of axillary or supraclavicular adenopathy.  ABDOMEN: Soft, flat, and nontender. No masses or organomegaly is noted. No hernias are noted. Bowel sounds are normal.  EXTREMITIES: Good range of motion, no edema.   Data Reviewed I have reviewed our old office notes as well as the notes from the cancer center  Assessment Excellent response from neoadjuvant chemotherapy.  Plan We will plan to do a needle guided lumpectomy of the right breast cancer and sentinel node evaluation. We'll also take her port out as she plans no further chemotherapy no matter what. I've gone over the indications risks and complications of  surgery and I think she understands. All questions have been answered.

## 2010-09-21 ENCOUNTER — Encounter (HOSPITAL_COMMUNITY)
Admission: RE | Admit: 2010-09-21 | Discharge: 2010-09-21 | Disposition: A | Discharge status: Home / Self Care | Payer: Medicare Other | Source: Ambulatory Visit | Attending: Surgery | Admitting: Surgery

## 2010-09-21 LAB — COMPREHENSIVE METABOLIC PANEL
ALT: 21 U/L (ref 0–35)
AST: 29 U/L (ref 0–37)
Alkaline Phosphatase: 112 U/L (ref 39–117)
CO2: 29 mEq/L (ref 19–32)
Calcium: 9.6 mg/dL (ref 8.4–10.5)
Chloride: 105 mEq/L (ref 96–112)
GFR calc Af Amer: >60 mL/min (ref >60)
GFR calc non Af Amer: >60 mL/min (ref >60)
Glucose, Bld: 90 mg/dL (ref 70–99)
Potassium: 4.2 mEq/L (ref 3.5–5.1)
Sodium: 143 mEq/L (ref 135–145)
Total Bilirubin: 0.3 mg/dL (ref 0.3–1.2)

## 2010-09-21 LAB — URINALYSIS, ROUTINE W REFLEX MICROSCOPIC
Glucose, UA: NEGATIVE mg/dL
Ketones, ur: 15 mg/dL — AB
Nitrite: NEGATIVE
Protein, ur: 100 mg/dL — AB
Urobilinogen, UA: 1 mg/dL (ref 0.0–1.0)

## 2010-09-21 LAB — DIFFERENTIAL
Basophils Absolute: 0 10*3/uL (ref 0.0–0.1)
Basophils Relative: 0 % (ref 0–1)
Eosinophils Absolute: 0.1 10*3/uL (ref 0.0–0.7)
Monocytes Absolute: 0.6 10*3/uL (ref 0.1–1.0)
Neutro Abs: 4.1 10*3/uL (ref 1.7–7.7)
Neutrophils Relative %: 60 % (ref 43–77)

## 2010-09-21 LAB — URINE MICROSCOPIC-ADD ON

## 2010-09-21 LAB — CBC
Hemoglobin: 13.1 g/dL (ref 12.0–15.0)
MCH: 30.8 pg (ref 26.0–34.0)
MCHC: 34.1 g/dL (ref 30.0–36.0)
Platelets: 158 10*3/uL (ref 150–400)

## 2010-09-28 ENCOUNTER — Encounter (HOSPITAL_BASED_OUTPATIENT_CLINIC_OR_DEPARTMENT_OTHER): Payer: Medicare Other | Admitting: Oncology

## 2010-09-28 DIAGNOSIS — Z171 Estrogen receptor negative status [ER-]: Secondary | ICD-10-CM

## 2010-09-28 DIAGNOSIS — C50419 Malignant neoplasm of upper-outer quadrant of unspecified female breast: Secondary | ICD-10-CM

## 2010-10-01 ENCOUNTER — Ambulatory Visit (HOSPITAL_COMMUNITY)
Admission: RE | Admit: 2010-10-01 | Discharge: 2010-10-01 | Disposition: A | Discharge status: Home / Self Care | Payer: Medicare Other | Source: Ambulatory Visit | Attending: Surgery | Admitting: Surgery

## 2010-10-01 ENCOUNTER — Ambulatory Visit
Admission: RE | Admit: 2010-10-01 | Discharge: 2010-10-01 | Disposition: A | Discharge status: Home / Self Care | Payer: Medicare Other | Source: Ambulatory Visit | Attending: Surgery | Admitting: Surgery

## 2010-10-01 ENCOUNTER — Other Ambulatory Visit (INDEPENDENT_AMBULATORY_CARE_PROVIDER_SITE_OTHER): Payer: Self-pay | Admitting: Surgery

## 2010-10-01 DIAGNOSIS — Z452 Encounter for adjustment and management of vascular access device: Secondary | ICD-10-CM | POA: Insufficient documentation

## 2010-10-01 DIAGNOSIS — C50919 Malignant neoplasm of unspecified site of unspecified female breast: Secondary | ICD-10-CM

## 2010-10-01 DIAGNOSIS — E039 Hypothyroidism, unspecified: Secondary | ICD-10-CM | POA: Insufficient documentation

## 2010-10-01 DIAGNOSIS — IMO0001 Reserved for inherently not codable concepts without codable children: Secondary | ICD-10-CM | POA: Insufficient documentation

## 2010-10-01 DIAGNOSIS — Z01812 Encounter for preprocedural laboratory examination: Secondary | ICD-10-CM | POA: Insufficient documentation

## 2010-10-01 DIAGNOSIS — C50419 Malignant neoplasm of upper-outer quadrant of unspecified female breast: Secondary | ICD-10-CM | POA: Insufficient documentation

## 2010-10-01 DIAGNOSIS — C50911 Malignant neoplasm of unspecified site of right female breast: Secondary | ICD-10-CM

## 2010-10-01 HISTORY — PX: OTHER SURGICAL HISTORY: SHX169

## 2010-10-01 MED ORDER — TECHNETIUM TC 99M SULFUR COLLOID FILTERED
1.0000 | Freq: Once | INTRAVENOUS | Status: AC | PRN
  Administered 2010-10-01: 1 via INTRADERMAL

## 2010-10-06 NOTE — Op Note (Signed)
[name], CLOYD             ACCOUNT NO.:  [id number]  MEDICAL RECORD NO.:  [id number]  LOCATION:  SDSC                         FACILITY:  [facility]  PHYSICIAN:  Currie Paris, M.D.DATE OF BIRTH:  02/03/1935  DATE OF PROCEDURE:  10/01/2010 DATE OF DISCHARGE:                              OPERATIVE REPORT   PREOPERATIVE DIAGNOSES: 1. Carcinoma right breast upper outer quadrant status post neoadjuvant     chemotherapy. 2. Unneeded Port-A-Cath.  PROCEDURE:  Needle guided right lumpectomy with blue dye injection and right axillary sentinel lymph node biopsy (two lymph nodes removed), Port-A-Cath removal.  SURGEON:  Currie Paris, MD  ANESTHESIA:  General.  CLINICAL HISTORY:  This is a 76 year old who has finished her neoadjuvant chemotherapy for her right-sided breast cancer with significant improvement in the size of the tumor.  At this point, she was scheduled for lumpectomy.  We also plan to take her port out as she anticipated no further chemotherapy.  DESCRIPTION OF PROCEDURE:  I saw the patient in the holding area, and she had no further questions.  I marked the right breast as the operative side.  I reviewed the guidewire localizing films and confirmed plans with the patient.  The patient was taken to the operating room and after satisfactory general anesthesia had been obtained, the right breast was prepped and draped.  The Port-A-Cath was located high on the right side and I opened the old scar, cut the 2-0 Prolene holding sutures and I put a Vicryl around the tract and back tubing out.  The incision was then closed with 3-0 Vicryl and 4-0 Monocryl subcuticular and Dermabond.  The guidewire entered laterally and tracked medially and the guidewire appeared to go well past the clip which was about 2-1/2 cm after the skin.  I made a curvilinear scission over the guidewire tract.  I raised superficial skin flap superiorly, inferiorly and medially.  I  then divided the breast tissue down to chest wall superiorly, then medially and I then started coming around and tried to divide it down inferiorly. I got close to a palpable firm area which I thought it might represent the tumor so I took some additional margin there and where I had made the fall start, I tacked the tissue back up, so that it would show the true margin.  I then laterally and got the entire tissue out. Especially, mammogram showed the clip a little bit close to the lateral inferior margins, so I took some more from that.  I made sure everything was dry.  I put clips into to mark the margins putting 2 on the deep margin and one each on the superior inferior, medial and lateral margins.  I irrigated.  I put about 10 mL of 0.25% plain Marcaine in.  I freed the breast tissue off the chest wall and closed the deeper layers with 3-0 Vicryl then a more superficial breast tissue and finally skin with 3-0 Vicryl and 4-0 Monocryl subcuticular and Dermabond.  The NeoProbe was used to identify a hot area in the axilla.  I made a transverse incision over that.  I divided some subcutaneous tissues and found a small blue lymph node which  had counts about 120 and this was excised.  Second adjacent node had some counts about 50 and was excised. There was no palpable adenopathy, no other blue dye and once that second node was removed all counts were down to about 0-2.  This incision was then closed with 3-0 Vicryl, 4-0 Monocryl subcuticular and Dermabond.  The patient tolerated the procedure well.  There were no operative complications and all counts were correct.     Currie Paris, M.D.     CJS/MEDQ  D:  10/01/2010  T:  10/01/2010  Job:  [id number]  Electronically Signed by Cyndia Bent M.D. on 10/06/2010 07:20:10 AM

## 2010-10-16 ENCOUNTER — Inpatient Hospital Stay (INDEPENDENT_AMBULATORY_CARE_PROVIDER_SITE_OTHER)
Admission: RE | Admit: 2010-10-16 | Discharge: 2010-10-16 | Disposition: A | Discharge status: Home / Self Care | Payer: Medicare Other | Source: Ambulatory Visit | Attending: Family Medicine | Admitting: Family Medicine

## 2010-10-16 ENCOUNTER — Encounter (INDEPENDENT_AMBULATORY_CARE_PROVIDER_SITE_OTHER): Payer: Medicare Other | Admitting: Surgery

## 2010-10-16 DIAGNOSIS — S8990XA Unspecified injury of unspecified lower leg, initial encounter: Secondary | ICD-10-CM

## 2010-10-16 DIAGNOSIS — S99929A Unspecified injury of unspecified foot, initial encounter: Secondary | ICD-10-CM

## 2010-10-21 ENCOUNTER — Ambulatory Visit (INDEPENDENT_AMBULATORY_CARE_PROVIDER_SITE_OTHER): Payer: Medicare Other | Admitting: Surgery

## 2010-10-21 ENCOUNTER — Encounter (INDEPENDENT_AMBULATORY_CARE_PROVIDER_SITE_OTHER): Payer: Self-pay | Admitting: Surgery

## 2010-10-21 VITALS — BP 128/72 | HR 72

## 2010-10-21 DIAGNOSIS — S81812A Laceration without foreign body, left lower leg, initial encounter: Secondary | ICD-10-CM

## 2010-10-21 DIAGNOSIS — C50919 Malignant neoplasm of unspecified site of unspecified female breast: Secondary | ICD-10-CM

## 2010-10-21 DIAGNOSIS — S81809A Unspecified open wound, unspecified lower leg, initial encounter: Secondary | ICD-10-CM

## 2010-10-21 NOTE — Progress Notes (Signed)
CC: Postop visit but also concerned about an injury to her left lower extremity with loss of skin. She was seen in the ED for that  HPI: This patient comes in for post op follow-up. She underwent right lumpectomy and sentinel the on 10/01/10. She feels that she is doing well.  Also she fell and scraped the left lower tremor T. with full-thickness loss of skin. She's seen in the emergency department and treated with some debridement and has been using antibiotic ointment and taking doxycycline. She wanted [place] to check it today has no other physicians are following this up.  PE: General: The patient appears to be healthy, NAD  Breast: The incision is healing nicely. There is no infection or other problem. The axillary incision is likewise doing well. Extremity: There is an open wound on the anterior tibial area of the left lower cavity fairly low down. It is approximately 4 x 6 cm. It is fairly clean with a few developing skin buds.  IMPRESSION: The patient is doing well S/P right lumpectomy and sentinel node. Her leg extremity should go ahead and heal up but will be continued local wound care and we will need to follow that up here.Marland Kitchen  DATA REVIWED: Pathology report was discussed with the patient. She was already aware of it appeared  PLAN: I will see her back in about two months for breast cancer followup. She will discuss radiation therapy with Dr. Donnie Coffin when she sees him tomorrow. I will see her back in two weeks to check the wound on her leg.

## 2010-10-21 NOTE — Patient Instructions (Signed)
Have Dr Donnie Coffin make your radiation appointment when you see him tomorrrow.  See me after you have finished the radiation

## 2010-10-22 ENCOUNTER — Other Ambulatory Visit: Payer: Self-pay | Admitting: Physician Assistant

## 2010-10-22 ENCOUNTER — Encounter (HOSPITAL_BASED_OUTPATIENT_CLINIC_OR_DEPARTMENT_OTHER): Payer: Medicare Other | Admitting: Oncology

## 2010-10-22 DIAGNOSIS — Z171 Estrogen receptor negative status [ER-]: Secondary | ICD-10-CM

## 2010-10-22 DIAGNOSIS — C50419 Malignant neoplasm of upper-outer quadrant of unspecified female breast: Secondary | ICD-10-CM

## 2010-10-22 LAB — CBC WITH DIFFERENTIAL/PLATELET
BASO%: 0.7 % (ref 0.0–2.0)
HCT: 35.6 % (ref 34.8–46.6)
LYMPH%: 33.9 % (ref 14.0–49.7)
MCHC: 33.5 g/dL (ref 31.5–36.0)
MCV: 88.9 fL (ref 79.5–101.0)
MONO#: 0.5 10*3/uL (ref 0.1–0.9)
MONO%: 9.2 % (ref 0.0–14.0)
NEUT%: 54.6 % (ref 38.4–76.8)
Platelets: 157 10*3/uL (ref 145–400)
RBC: 4 10*6/uL (ref 3.70–5.45)
WBC: 4.9 10*3/uL (ref 3.9–10.3)

## 2010-10-22 LAB — COMPREHENSIVE METABOLIC PANEL
Alkaline Phosphatase: 72 U/L (ref 39–117)
CO2: 32 mEq/L (ref 19–32)
Creatinine, Ser: 0.72 mg/dL (ref 0.50–1.10)
Glucose, Bld: 80 mg/dL (ref 70–99)
Total Bilirubin: 0.4 mg/dL (ref 0.3–1.2)

## 2010-10-29 ENCOUNTER — Ambulatory Visit
Admission: RE | Admit: 2010-10-29 | Discharge: 2010-10-29 | Disposition: A | Discharge status: Home / Self Care | Payer: Medicare Other | Source: Ambulatory Visit | Attending: Radiation Oncology | Admitting: Radiation Oncology

## 2010-10-29 DIAGNOSIS — Z510 Encounter for antineoplastic radiation therapy: Secondary | ICD-10-CM | POA: Insufficient documentation

## 2010-10-29 DIAGNOSIS — Z803 Family history of malignant neoplasm of breast: Secondary | ICD-10-CM | POA: Insufficient documentation

## 2010-10-29 DIAGNOSIS — C50919 Malignant neoplasm of unspecified site of unspecified female breast: Secondary | ICD-10-CM | POA: Insufficient documentation

## 2010-10-29 DIAGNOSIS — D0590 Unspecified type of carcinoma in situ of unspecified breast: Secondary | ICD-10-CM | POA: Insufficient documentation

## 2010-10-29 DIAGNOSIS — Z79899 Other long term (current) drug therapy: Secondary | ICD-10-CM | POA: Insufficient documentation

## 2010-10-29 DIAGNOSIS — Z8041 Family history of malignant neoplasm of ovary: Secondary | ICD-10-CM | POA: Insufficient documentation

## 2010-10-29 DIAGNOSIS — IMO0001 Reserved for inherently not codable concepts without codable children: Secondary | ICD-10-CM | POA: Insufficient documentation

## 2010-11-01 ENCOUNTER — Emergency Department (HOSPITAL_COMMUNITY): Payer: Medicare Other

## 2010-11-01 ENCOUNTER — Emergency Department (HOSPITAL_COMMUNITY)
Admission: EM | Admit: 2010-11-01 | Discharge: 2010-11-01 | Disposition: A | Discharge status: Home / Self Care | Payer: Medicare Other | Attending: Emergency Medicine | Admitting: Emergency Medicine

## 2010-11-01 DIAGNOSIS — Y92009 Unspecified place in unspecified non-institutional (private) residence as the place of occurrence of the external cause: Secondary | ICD-10-CM | POA: Insufficient documentation

## 2010-11-01 DIAGNOSIS — E039 Hypothyroidism, unspecified: Secondary | ICD-10-CM | POA: Insufficient documentation

## 2010-11-01 DIAGNOSIS — R55 Syncope and collapse: Secondary | ICD-10-CM | POA: Insufficient documentation

## 2010-11-01 DIAGNOSIS — C50919 Malignant neoplasm of unspecified site of unspecified female breast: Secondary | ICD-10-CM | POA: Insufficient documentation

## 2010-11-01 DIAGNOSIS — E860 Dehydration: Secondary | ICD-10-CM | POA: Insufficient documentation

## 2010-11-01 DIAGNOSIS — M25569 Pain in unspecified knee: Secondary | ICD-10-CM | POA: Insufficient documentation

## 2010-11-01 DIAGNOSIS — R296 Repeated falls: Secondary | ICD-10-CM | POA: Insufficient documentation

## 2010-11-01 DIAGNOSIS — S93409A Sprain of unspecified ligament of unspecified ankle, initial encounter: Secondary | ICD-10-CM | POA: Insufficient documentation

## 2010-11-01 LAB — COMPREHENSIVE METABOLIC PANEL
ALT: 15 U/L (ref 0–35)
BUN: 16 mg/dL (ref 6–23)
CO2: 27 mEq/L (ref 19–32)
Calcium: 9.3 mg/dL (ref 8.4–10.5)
Creatinine, Ser: 0.78 mg/dL (ref 0.50–1.10)
GFR calc Af Amer: >60 mL/min (ref >60)
GFR calc non Af Amer: >60 mL/min (ref >60)
Glucose, Bld: 90 mg/dL (ref 70–99)
Sodium: 138 mEq/L (ref 135–145)

## 2010-11-01 LAB — URINALYSIS, ROUTINE W REFLEX MICROSCOPIC
Bilirubin Urine: NEGATIVE
Nitrite: NEGATIVE
Specific Gravity, Urine: 1.01 (ref 1.005–1.030)
Urobilinogen, UA: 0.2 mg/dL (ref 0.0–1.0)

## 2010-11-01 LAB — POCT I-STAT, CHEM 8
BUN: 16 mg/dL (ref 6–23)
Chloride: 106 mEq/L (ref 96–112)
Creatinine, Ser: 1.2 mg/dL — ABNORMAL HIGH (ref 0.50–1.10)
Sodium: 135 mEq/L (ref 135–145)

## 2010-11-01 LAB — DIFFERENTIAL
Lymphocytes Relative: 22 % (ref 12–46)
Lymphs Abs: 2.1 10*3/uL (ref 0.7–4.0)
Monocytes Relative: 10 % (ref 3–12)
Neutro Abs: 6.4 10*3/uL (ref 1.7–7.7)
Neutrophils Relative %: 66 % (ref 43–77)

## 2010-11-01 LAB — CBC
HCT: 37.2 % (ref 36.0–46.0)
Hemoglobin: 12 g/dL (ref 12.0–15.0)
MCH: 29.1 pg (ref 26.0–34.0)
MCV: 90.3 fL (ref 78.0–100.0)
RBC: 4.12 MIL/uL (ref 3.87–5.11)

## 2010-11-01 LAB — GLUCOSE, CAPILLARY: Glucose-Capillary: 117 mg/dL — ABNORMAL HIGH (ref 70–99)

## 2010-11-01 LAB — POCT I-STAT TROPONIN I: Troponin i, poc: 0.01 ng/mL (ref 0.00–0.08)

## 2010-11-03 ENCOUNTER — Inpatient Hospital Stay (HOSPITAL_COMMUNITY): Payer: Medicare Other

## 2010-11-03 ENCOUNTER — Inpatient Hospital Stay (HOSPITAL_COMMUNITY)
Admission: AD | Admit: 2010-11-03 | Discharge: 2010-11-06 | DRG: 641 | Disposition: A | Discharge status: Home Health | Payer: Medicare Other | Source: Ambulatory Visit | Attending: Internal Medicine | Admitting: Internal Medicine

## 2010-11-03 ENCOUNTER — Encounter (HOSPITAL_COMMUNITY): Payer: Self-pay | Admitting: Radiology

## 2010-11-03 DIAGNOSIS — R079 Chest pain, unspecified: Secondary | ICD-10-CM | POA: Diagnosis not present

## 2010-11-03 DIAGNOSIS — Z9079 Acquired absence of other genital organ(s): Secondary | ICD-10-CM

## 2010-11-03 DIAGNOSIS — R059 Cough, unspecified: Secondary | ICD-10-CM | POA: Diagnosis present

## 2010-11-03 DIAGNOSIS — E785 Hyperlipidemia, unspecified: Secondary | ICD-10-CM | POA: Diagnosis present

## 2010-11-03 DIAGNOSIS — M810 Age-related osteoporosis without current pathological fracture: Secondary | ICD-10-CM | POA: Diagnosis present

## 2010-11-03 DIAGNOSIS — E876 Hypokalemia: Secondary | ICD-10-CM | POA: Diagnosis not present

## 2010-11-03 DIAGNOSIS — IMO0001 Reserved for inherently not codable concepts without codable children: Secondary | ICD-10-CM | POA: Diagnosis present

## 2010-11-03 DIAGNOSIS — E860 Dehydration: Principal | ICD-10-CM | POA: Diagnosis present

## 2010-11-03 DIAGNOSIS — M25579 Pain in unspecified ankle and joints of unspecified foot: Secondary | ICD-10-CM | POA: Diagnosis present

## 2010-11-03 DIAGNOSIS — C50919 Malignant neoplasm of unspecified site of unspecified female breast: Secondary | ICD-10-CM | POA: Diagnosis present

## 2010-11-03 DIAGNOSIS — Z853 Personal history of malignant neoplasm of breast: Secondary | ICD-10-CM

## 2010-11-03 DIAGNOSIS — R269 Unspecified abnormalities of gait and mobility: Secondary | ICD-10-CM | POA: Diagnosis present

## 2010-11-03 DIAGNOSIS — I951 Orthostatic hypotension: Secondary | ICD-10-CM | POA: Diagnosis present

## 2010-11-03 DIAGNOSIS — D649 Anemia, unspecified: Secondary | ICD-10-CM | POA: Diagnosis present

## 2010-11-03 DIAGNOSIS — T451X5A Adverse effect of antineoplastic and immunosuppressive drugs, initial encounter: Secondary | ICD-10-CM | POA: Diagnosis present

## 2010-11-03 DIAGNOSIS — R55 Syncope and collapse: Secondary | ICD-10-CM

## 2010-11-03 DIAGNOSIS — K589 Irritable bowel syndrome without diarrhea: Secondary | ICD-10-CM | POA: Diagnosis present

## 2010-11-03 DIAGNOSIS — G4733 Obstructive sleep apnea (adult) (pediatric): Secondary | ICD-10-CM | POA: Diagnosis present

## 2010-11-03 DIAGNOSIS — D6959 Other secondary thrombocytopenia: Secondary | ICD-10-CM | POA: Diagnosis present

## 2010-11-03 DIAGNOSIS — J984 Other disorders of lung: Secondary | ICD-10-CM | POA: Diagnosis present

## 2010-11-03 DIAGNOSIS — M25469 Effusion, unspecified knee: Secondary | ICD-10-CM | POA: Diagnosis present

## 2010-11-03 DIAGNOSIS — Z79899 Other long term (current) drug therapy: Secondary | ICD-10-CM

## 2010-11-03 DIAGNOSIS — R05 Cough: Secondary | ICD-10-CM | POA: Diagnosis present

## 2010-11-03 LAB — CBC
HCT: 37.1 % (ref 36.0–46.0)
Hemoglobin: 11.7 g/dL — ABNORMAL LOW (ref 12.0–15.0)
RBC: 4.05 MIL/uL (ref 3.87–5.11)

## 2010-11-03 LAB — DIFFERENTIAL
Basophils Absolute: 0 10*3/uL (ref 0.0–0.1)
Eosinophils Relative: 3 % (ref 0–5)
Lymphocytes Relative: 32 % (ref 12–46)
Lymphs Abs: 2.3 10*3/uL (ref 0.7–4.0)
Monocytes Absolute: 0.7 10*3/uL (ref 0.1–1.0)

## 2010-11-03 LAB — COMPREHENSIVE METABOLIC PANEL
ALT: 16 U/L (ref 0–35)
Alkaline Phosphatase: 81 U/L (ref 39–117)
CO2: 29 mEq/L (ref 19–32)
GFR calc Af Amer: >60 mL/min (ref >60)
GFR calc non Af Amer: >60 mL/min (ref >60)
Glucose, Bld: 86 mg/dL (ref 70–99)
Potassium: 3.7 mEq/L (ref 3.5–5.1)
Sodium: 139 mEq/L (ref 135–145)
Total Bilirubin: 0.3 mg/dL (ref 0.3–1.2)

## 2010-11-03 MED ORDER — IOHEXOL 300 MG/ML  SOLN
100.0000 mL | Freq: Once | INTRAMUSCULAR | Status: AC | PRN
  Administered 2010-11-03: 100 mL via INTRAVENOUS

## 2010-11-03 NOTE — H&P (Addendum)
[name], CORELLA NO.:  [id number]  MEDICAL RECORD NO.:  [id number]  LOCATION:  1406                         FACILITY:  [facility]  PHYSICIAN:  Hillery Aldo, M.D.   DATE OF BIRTH:  16-Mar-1934  DATE OF ADMISSION:  11/03/2010 DATE OF DISCHARGE:                             HISTORY & PHYSICAL   PRIMARY CARE PROVIDER:  Tera Mater. [name], M.D.  ONCOLOGIST:  Pierce Crane, M.D., F.R.C.P.C.  GASTROENTEROLOGIST:  Petra Kuba, M.D.  CHIEF COMPLAINT:  Syncope.  HISTORY OF PRESENT ILLNESS:  [name] is a very pleasant 76 year old female with a history of irritable bowel syndrome which causes chronic diarrhea, breast cancer status post intravenous chemotherapy completed in July, colitis, thrombocytopenia, normocytic anemia, who presented to [facility] directly from home with a chief complaint of syncope. Information is obtained from the patient and the family who was at the bedside.  The patient reports that 2 days ago after coming home from church she was sitting on the deck at her home, she got up to walk indoor, her vision became dark and she fell down.  She denied any dizziness or indication that she was going to fall down.  The husband reports that he heard her fall, came into the den and picked her up.  He and the son lifted the patient onto her feet and assisted her to ambulate to another part of the house at which time she passed out in their arms 3 times.  She was brought to the emergency room, at that time diagnosed with dehydration.  Lab work was within the limits of normal. She was low hypertensive, presumably from pain to the left knee that was evaluated status post fall.  She went home.  The patient does report that at the time of discharge she had difficulty bearing weight and getting into the car.  Yesterday, she spent most of the day lying down. However, she did indicate that she got up to go to the bathroom and denied any dizziness, shortness of  breath, chest pain, palpitation.  She denies any recent illness, fever, chills, nausea, vomiting, or increased diarrhea.  The patient has been eating and drinking her normal amount and denies any unintentional weight loss of late.  Of note, the patient has completed 8 months of chemotherapy for breast cancer, so she has undergone a weight loss from that process.  She denies any recent nausea, vomiting, fever, chills.  She does indicate that 3 weeks ago, she scraped her leg and was provided with Cipro for a wound infection. She completed that 4 days ago.  This morning, the daughter who is a nurse called Dr. Darnelle Catalan who spoke with Dr. Donnie Coffin, the patient's oncologist and it was decided to admit the patient directly for further workup and evaluation of her syncope.  The patient indicates that symptoms came on suddenly are resolved, characterized as moderate to severe.  ALLERGIES:  PENICILLIN.  PAST MEDICAL HISTORY: 1. Remote history of left breast cancer 15 years ago. 2. Right-sided breast cancer diagnosed in October 2011, completed     chemotherapy in July 2012. 3. Abnormal lung pathology questionable lymphoma not being treated at     the moment. 4.  Fibromyalgia. 5. Osteoporosis. 6. Irritable bowel syndrome. 7. Obstructive sleep apnea, not on CPAP. 8. Hyperlipidemia. 9. Nondiagnostic pulmonary nodules as noted above. 10.Chronic cough with normal spirometry.  PAST SURGICAL HISTORY: 1. Hysterectomy. 2. Oophorectomy. 3. Cholecystectomy. 4. Appendectomy. 5. Left breast lumpectomy 6. Port-A-Cath removal. 7. Back surgery.  FAMILY MEDICAL HISTORY:  Significant for father with emphysema.  Mother and sister with ovarian cancer.  SOCIAL HISTORY:  Married, lives with her husband.  Denies any tobacco, alcohol, or illicit drug use.  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  MEDICATIONS: 1. Tessalon 100 mg capsules 2 caps p.o. every 6 hours as needed for     cough. 2. Cymbalta 30 mg p.o.  daily. 3. Vitamin D 50,000 units p.o. once a week. 4. Vytorin 10/80 mg p.o. q.h.s. 5. Neurontin dosage unknown. 6. Synthroid 88 mcg by mouth daily. 7. Ativan 0.5 mg p.o. every 8 hours. 8. Bactroban 2% ointment topically daily. 9. Ambien 10 mg p.o. daily at bedtime.  REVIEW OF SYSTEMS:  GENERAL:  Negative for fever, chills, anorexia, unintentional weight loss.  ENT: Negative for ear pain, nasal congestion, sore throat. CV: Negative for chest pain, palpitation, lower extremity edema. RESPIRATORY:  Negative for shortness of breath, cough.  MUSCULOSKELETAL: Positive for some left knee pain and swelling.  NEURO:  See HPI.  GI: Positive for history of irritable bowel syndrome.  GU: Negative for dysuria, hematuria, frequency, or urgency.  PSYCH:  Positive for depression, anxiety.  HEME:  Negative for any unusual bruising or bleeding.  LABORATORY DATA:  Admitting labs are pending at the time of this dictation.  RADIOLOGY:  Pending at the time of this dictation.  PHYSICAL EXAMINATION:  VITAL SIGNS: T 98.5, blood pressure 153/81, respirations 16, heart rate 66, sats 98% on room air. GENERAL:  Awake, alert, well nourished, well hydrated no acute distress. HEAD:  Normocephalic, atraumatic.  Mucous membranes of her mouth are pink, slightly dry.  Pupils equal, round, reactive to light.  No obvious lesion or exudate in her nose or ears. NECK:  Supple.  No JVD.  Full range of motion.  No lymphadenopathy. RESPIRATORY:  Normal effort.  Breath sounds clear to auscultation bilaterally.  No rhonchi, wheezes, or rales. CV: Regular rate and rhythm.  No murmur, gallop, or rub.  No lower extremity edema.  Pedal pulses present and palpable. ABDOMEN:  Flat, soft, positive bowel sounds throughout, nontender to palpation.  No mass or organomegaly noted. NEURO:  Alert and oriented x3.  Speech clear.  Cranial nerves II through XII grossly intact. MUSCULOSKELETAL:  Moves all extremities except left knee  where knee immobilizer is in place.  Small amount of swelling.  Tender to palpation.  Full range of motion albeit painfully.  ASSESSMENT AND PLAN: 1. Syncope, etiology unclear.  I.e., orthostatic hypotension, vagal     response.  There is concern for some mets given her history.  We     will admit to tele.  We will get a CT of the brain.  We will check     12-lead EKG and cycle cardiac enzymes.  We will also get a 2-D echo     and carotid Doppler.  The patient complains of some left axillary     pain status post her fall presumably from being picked up.  We will     get a chest x-ray to evaluate for rib fracture.  We will hydrate     gently with IV fluids and monitor closely.  We will request PT,  OT     consult.  We will check orthostatics on admission and daily. 2. Left knee and left ankle pain.  X-ray on September 2 yields no     acute fracture of the left knee, but some effusion of the left     ankle with soft tissue swelling.  No acute abnormality.  Continue     with knee immobilizer and request PT, OT consult.  Provide pain     management.  If no improvement, consider further imaging and/or an     ortho consult. 3. History of breast cancer.  Completed chemotherapy in July.  Port-A-     Cath removed on October 01, 2010.  We will consult with Dr. Donnie Coffin,     her oncologist as needed. 4. History of irritable bowel syndrome.  No increase in diarrhea/loose     stool of late.  Currently at baseline. 5. Hypothyroidism.  We will check a TSH and continue her Synthroid. 6. Hyperlipidemia.  We will check fasting lipid panel and continue her     statin. 7. History of fibromyalgia, currently at baseline.  We will continue     her Neurontin. 8. Mild thrombocytopenia, status post chemo, currently at baseline.     We will monitor. 9. DVT prophylaxis.  We will use SCDs. 10.Code status.  The patient is a full code.  This assessment and plan was discussed with Dr. Darnelle Catalan.  It was truly a pleasure  taking care of Ms. Parsell.     Gwenyth Bender, NP   ______________________________ Hillery Aldo, M.D.    KMB/MEDQ  D:  11/03/2010  T:  11/03/2010  Job:  [id number]  cc:   Jeannett Senior A. [name], M.D. Fax: [phone]  Pierce Crane, M.D., F.R.C.P.C. Fax: [phone]  Petra Kuba, M.D. Fax: [phone]  Electronically Signed by Hillery Aldo M.D. on 11/03/2010 52:84:13 PM Electronically Signed by Toya Smothers  on 11/19/2010 07:54:58 AM

## 2010-11-04 ENCOUNTER — Inpatient Hospital Stay (HOSPITAL_COMMUNITY): Payer: Medicare Other

## 2010-11-04 ENCOUNTER — Encounter (INDEPENDENT_AMBULATORY_CARE_PROVIDER_SITE_OTHER): Payer: Medicare Other | Admitting: Surgery

## 2010-11-04 LAB — CARDIAC PANEL(CRET KIN+CKTOT+MB+TROPI)
CK, MB: 1.1 ng/mL (ref 0.3–4.0)
Relative Index: 0.9 (ref 0.0–2.5)
Total CK: 116 U/L (ref 7–177)
Total CK: 93 U/L (ref 7–177)
Troponin I: <0.3 ng/mL (ref <0.30)

## 2010-11-04 LAB — LIPID PANEL
Cholesterol: 147 mg/dL (ref 0–200)
Total CHOL/HDL Ratio: 2.7 RATIO
VLDL: 12 mg/dL (ref 0–40)

## 2010-11-04 LAB — HEMOGLOBIN A1C
Hgb A1c MFr Bld: 5.3 % (ref <5.7)
Mean Plasma Glucose: 105 mg/dL (ref <117)

## 2010-11-04 MED ORDER — XENON XE 133 GAS
10.0000 | GAS_FOR_INHALATION | Freq: Once | RESPIRATORY_TRACT | Status: AC | PRN
Start: 2010-11-04 — End: 2010-11-04
  Administered 2010-11-04: 10 via RESPIRATORY_TRACT

## 2010-11-04 MED ORDER — TECHNETIUM TO 99M ALBUMIN AGGREGATED
6.0000 | Freq: Once | INTRAVENOUS | Status: AC | PRN
  Administered 2010-11-04: 6 via INTRAVENOUS

## 2010-11-06 LAB — BASIC METABOLIC PANEL
CO2: 25 mEq/L (ref 19–32)
Calcium: 8.7 mg/dL (ref 8.4–10.5)
Creatinine, Ser: 0.51 mg/dL (ref 0.50–1.10)

## 2010-11-06 NOTE — Discharge Summary (Addendum)
[name], [name] NO.:  [id number]  MEDICAL RECORD NO.:  [id number]  LOCATION:  1406                         FACILITY:  [facility]  PHYSICIAN:  Brendia Sacks, MD    DATE OF BIRTH:  1934-03-05  DATE OF ADMISSION:  11/03/2010 DATE OF DISCHARGE:                        DISCHARGE SUMMARY - REFERRING   DATE OF DISCHARGE:  Is to be determined.  PRIMARY CARE PROVIDER:  Tera Mater. [name], M.D.  ONCOLOGIST:  Pierce Crane, M.D., F.R.C.P.C.  GASTROENTEROLOGIST:  Petra Kuba, M.D.  DISCHARGE DIAGNOSES: 1. Syncope. 2. Chest pain. 3. Left knee and ankle pain. 4. Unsteady gait. 5. Irritable bowel syndrome. 6. History of breast cancer.  DISCHARGE MEDICATIONS: 1. Ativan 0.5 mg 1 tablet p.o. t.i.d. as needed for anxiety. 2. Bactroban 2% one application topically b.i.d. 3. Cymbalta 30 mg one cap p.o. daily. 4. Gabapentin 1-3 capsules p.o. t.i.d. as needed for pain. 5. Prenatal over-the-counter 1 tablet p.o. daily. 6. Synthroid 88 mcg 1 tablet p.o. daily. 7. Tessalon Perles 100 mg two caps p.o. every 6 hours as needed for     coughing. 8. Vitamin D 50,000 units p.o. weekly, takes on Wednesdays. 9. Vytorin 10/80 p.o. daily at bedtime. 10.Zolpidem 10 mg p.o. daily at bedtime as needed for sleep.  DIAGNOSTIC LABORATORIES:  WBC 7.3, hemoglobin 11.7, hematocrit 37.1, platelets 141.  Sodium 139, potassium 3.7, chloride 104, CO2 of 29, BUN 8, creatinine 0.58, glucose 86, total protein 5.8, albumin 2.9.  First set of cardiac enzymes:  Total CK of 64, CK-MB 1.1, troponin I less than 0.30.  TSH is 1.215, D-dimer was 1.00.  Second set of cardiac enzymes: Total CK 116, CK-MB 1.1, troponin I less than 0.30.  Third set of cardiac enzymes:  Total CK 93, CK-MB 1.0, troponin I less than 0.30. Lipid profile yields cholesterol 147, triglycerides 62, HDL 54, LDL 81, hemoglobin A1c is 5.3.  DIAGNOSTIC IMAGING: 1. CT of the head with bowel contrast no acute abnormality and  negative for metastatic disease. 2. Chest x-ray done on September 4th shows COPD.  Small left effusion.     VQ scan done on September 5th normal ventilation perfusion lung     scan.  No evidence of pulmonary embolism. 3. 2-D echo done on September 4th yields normal overall LV function,     although images were limited.  Aortic valve sclerosis without     stenosis. 4. Carotid Dopplers done on September 4th yields no obvious evidence     of ICA stenosis.  CONSULTATIONS:  None.  PROCEDURES:  None.  BRIEF HISTORY:  [name] is a 76 year old female with a history of irritable bowel syndrome, which causes chronic diarrhea, breast CA status post IV chemotherapy completed in July of this year, colitis, thrombocytopenia, normocytic anemia presented to room 1406 directly from home with a chief complaint of syncope.  The patient reports that 2 days prior to coming to the hospital, she had a syncope event that was unwitnessed, but the husband found her on the floor.  She indicates that she got up from her chair on the deck and walked into the house, her vision became dark and she fell down.  She did not experience any dizziness  or indication that she was going to fall.  Husband picked her up and ambulated her to the other room.  During which time, she proceeded to "passed out three times."  At that time, she was brought to the emergency room, diagnosed with dehydration.  She was provided with IV fluids.  Lab work was within the limits of normal.  She was discharged home with diagnosis of dehydration.  The next day at home, she continued to have difficulty bearing weight and feels weak, but has had no further syncopal events.  She denied chest pain, palpitations, any recent illness, fever, or chills.  Of note, the patient has completed 8 months chemotherapy for breast cancer and had undergone some weight loss.  She recently had a Port-A-Cath removed surgically.  [facility] were asked  to admit for further evaluation and treatment.  HOSPITAL COURSE BY PROBLEM: 1. Syncope probably secondary to orthostasis.  The patient was     admitted to telemetry unit.  2-D echo, carotid Dopplers, CT of the     head were obtained as dictated above.  The patient was orthostatic.     She was provided with IV fluids.  The next day, her IV fluids were     discontinued and her blood pressure was more stable.  24 hours     later, the patient appeared dry clinically on exam.  Orthostatics     were rechecked.  The patient was orthostatic.  She did not     experience any dizziness or lightheadedness.  At the time of this     dictation, it has been decided the patient will spend the night for     further IV hydration.  Suspect her orthostatics is due to her     dehydration, which is likely an autoimmune dysfunction secondary to     the chemotherapy.  Her Norvasc was discontinued.  It was     recommended that she will be provided with support hose on     discharge. 2. Left knee and ankle pain.  When the patient fell prior to her     admission, she experienced some left knee and ankle pain.  At the     time of that evaluation, her left knee x-ray showed a left knee     effusion.  During this hospitalization, she was provided with pain     medicine and elevation and ice to that knee.  During the course of     her hospitalization, that improved.  At the time this dictation,     still experiences some mild-to-moderate pain in the left knee and     ankle during ambulation.  She also has a knee immobilizer that she     can use for support during ambulation. 3. Chest pain most likely musculoskeletal.  On September 6, the     patient experienced sharp chest pain while lying on the left side.     Pain was nonradiating.  It was brief in duration.  It subsided when     she turned over.  It was reproducible to palpation.  EKG was done     and showed normal sinus rhythm.  Her vital signs were stable.      There were no other symptoms such as shortness of breath, nausea,     or diaphoresis. 4. Unsteady gait.  The patient reports unsteady gait over the last 6-8     months.  PT eval recommends outpatient PT, OT for safety  and if     that is not possible, home health PT.  Specifically for balance     training.  The patient did demonstrate an increased risk for fall.     They are also recommending a rolling walker. 5. History of irritable bowel syndrome.  The patient continued on her     home meds, remained at baseline. 6. History of breast cancer.  The patient completed chemotherapy in     July of this year.  She is currently undergoing radiation.  Next     treatment is scheduled for November 10, 2010.  FOLLOW UP:  Follow up with Dr. Donnie Coffin in 1 week.  If symptoms persist, may want to consider cardiology consult.  DIET:  Regular as tolerated.  ACTIVITY:  Increase activity slowly.  The patient has been instructed to change positions slowly.  Final recommendations to be made by PT for discharge, specifically may need a rolling walker.     Gwenyth Bender, NP   ______________________________ Brendia Sacks, MD    KMB/MEDQ  D:  11/05/2010  T:  11/05/2010  Job:  [id number]  cc:   Jeannett Senior A. [name], M.D. Fax: [phone] NWGNF, M.D., F.R.C.P.C. Fax: [phone]  Petra Kuba, M.D. Fax: [phone]  Electronically Signed by Brendia Sacks  on 11/06/2010 09:41:15 PM Electronically Signed by Toya Smothers  on 11/19/2010 07:54:53 AM

## 2010-11-20 NOTE — Discharge Summary (Signed)
  NAMEMELODYE, SWOR NO.:  [id number]  MEDICAL RECORD NO.:  [id number]  LOCATION:  1406                         FACILITY:  [facility]  PHYSICIAN:  Brendia Sacks, MD    DATE OF BIRTH:  1934/04/15  DATE OF ADMISSION:  11/03/2010 DATE OF DISCHARGE:                        DISCHARGE SUMMARY - REFERRING   ADDENDUM  PRIMARY CARE PHYSICIAN:  Tera Mater. [name], M.D.  ONCOLOGIST:  Pierce Crane, M.D., F.R.C.P.C.  CARDIOLOGIST:  Madolyn Frieze. Jens Som, MD, [facility].  DATE OF DISPOSITION:  November 06, 2010.  Please see complete discharge dictation done on November 05, 2010.  An addendum, the patient's orthostasis has resolved with IV fluids. Suspect the patient's syncope was directly related to this; however, given the number of syncopal episodes, we will arrange Holter monitor to be followed up by [facility].  The patient has also requested a home health PT over outpatient physical therapy as she feels it will be difficult to attend outpatient therapy as she is going to begin radiation soon, and based on history, she becomes quite weak with radiation.  Home health physical therapy as well as rolling walker are to be arranged prior to discharge.  The patient felt medically stable for discharge home.  She was instructed to follow up with her oncologist in one week's time.  [facility] will contact the patient to arrange Holter monitor setup.     Cordelia Pen, NP   ______________________________ Brendia Sacks, MD    LE/MEDQ  D:  11/06/2010  T:  11/06/2010  Job:  [id number]  cc:   Tera Mater. [name], M.D. Fax: [phone]  Pierce Crane, M.D., F.R.C.P.C. Fax: [phone]  Madolyn Frieze. Jens Som, MD, [facility] [address]  [address] [place] [address]  Electronically Signed by Brendia Sacks  on 11/20/2010 09:09:31 AM

## 2010-12-18 ENCOUNTER — Emergency Department (HOSPITAL_COMMUNITY): Payer: Medicare Other

## 2010-12-18 ENCOUNTER — Inpatient Hospital Stay (HOSPITAL_COMMUNITY)
Admission: EM | Admit: 2010-12-18 | Discharge: 2010-12-25 | DRG: 480 | Disposition: A | Discharge status: Rehabilitation Facility | Payer: Medicare Other | Attending: Endocrinology | Admitting: Endocrinology

## 2010-12-18 DIAGNOSIS — Z79899 Other long term (current) drug therapy: Secondary | ICD-10-CM

## 2010-12-18 DIAGNOSIS — R579 Shock, unspecified: Secondary | ICD-10-CM | POA: Diagnosis not present

## 2010-12-18 DIAGNOSIS — E2749 Other adrenocortical insufficiency: Secondary | ICD-10-CM | POA: Diagnosis present

## 2010-12-18 DIAGNOSIS — G929 Unspecified toxic encephalopathy: Secondary | ICD-10-CM | POA: Diagnosis not present

## 2010-12-18 DIAGNOSIS — IMO0001 Reserved for inherently not codable concepts without codable children: Secondary | ICD-10-CM | POA: Diagnosis present

## 2010-12-18 DIAGNOSIS — J40 Bronchitis, not specified as acute or chronic: Secondary | ICD-10-CM | POA: Diagnosis not present

## 2010-12-18 DIAGNOSIS — S72143A Displaced intertrochanteric fracture of unspecified femur, initial encounter for closed fracture: Principal | ICD-10-CM | POA: Diagnosis present

## 2010-12-18 DIAGNOSIS — I10 Essential (primary) hypertension: Secondary | ICD-10-CM | POA: Diagnosis not present

## 2010-12-18 DIAGNOSIS — Z880 Allergy status to penicillin: Secondary | ICD-10-CM

## 2010-12-18 DIAGNOSIS — E876 Hypokalemia: Secondary | ICD-10-CM | POA: Diagnosis not present

## 2010-12-18 DIAGNOSIS — S7223XA Displaced subtrochanteric fracture of unspecified femur, initial encounter for closed fracture: Secondary | ICD-10-CM | POA: Diagnosis present

## 2010-12-18 DIAGNOSIS — H109 Unspecified conjunctivitis: Secondary | ICD-10-CM | POA: Diagnosis not present

## 2010-12-18 DIAGNOSIS — D696 Thrombocytopenia, unspecified: Secondary | ICD-10-CM | POA: Diagnosis not present

## 2010-12-18 DIAGNOSIS — E039 Hypothyroidism, unspecified: Secondary | ICD-10-CM | POA: Diagnosis present

## 2010-12-18 DIAGNOSIS — M810 Age-related osteoporosis without current pathological fracture: Secondary | ICD-10-CM | POA: Diagnosis present

## 2010-12-18 DIAGNOSIS — C50919 Malignant neoplasm of unspecified site of unspecified female breast: Secondary | ICD-10-CM | POA: Diagnosis present

## 2010-12-18 DIAGNOSIS — Y9389 Activity, other specified: Secondary | ICD-10-CM

## 2010-12-18 DIAGNOSIS — D649 Anemia, unspecified: Secondary | ICD-10-CM | POA: Diagnosis not present

## 2010-12-18 DIAGNOSIS — Y9229 Other specified public building as the place of occurrence of the external cause: Secondary | ICD-10-CM

## 2010-12-18 DIAGNOSIS — W08XXXA Fall from other furniture, initial encounter: Secondary | ICD-10-CM | POA: Diagnosis present

## 2010-12-18 DIAGNOSIS — G92 Toxic encephalopathy: Secondary | ICD-10-CM | POA: Diagnosis not present

## 2010-12-18 DIAGNOSIS — Y998 Other external cause status: Secondary | ICD-10-CM

## 2010-12-18 DIAGNOSIS — E46 Unspecified protein-calorie malnutrition: Secondary | ICD-10-CM | POA: Diagnosis not present

## 2010-12-18 DIAGNOSIS — E8779 Other fluid overload: Secondary | ICD-10-CM | POA: Diagnosis not present

## 2010-12-18 LAB — CBC
MCH: 30.1 pg (ref 26.0–34.0)
MCHC: 32.5 g/dL (ref 30.0–36.0)
Platelets: 207 10*3/uL (ref 150–400)
RBC: 4.28 MIL/uL (ref 3.87–5.11)
RDW: 15 % (ref 11.5–15.5)

## 2010-12-18 LAB — DIFFERENTIAL
Basophils Relative: 0 % (ref 0–1)
Eosinophils Absolute: 0.1 10*3/uL (ref 0.0–0.7)
Monocytes Absolute: 0.5 10*3/uL (ref 0.1–1.0)
Monocytes Relative: 7 % (ref 3–12)
Neutrophils Relative %: 73 % (ref 43–77)

## 2010-12-18 LAB — ABO/RH: ABO/RH(D): O POS

## 2010-12-18 LAB — URINALYSIS, ROUTINE W REFLEX MICROSCOPIC
Bilirubin Urine: NEGATIVE
Glucose, UA: NEGATIVE mg/dL
Hgb urine dipstick: NEGATIVE
Specific Gravity, Urine: 1.012 (ref 1.005–1.030)
Urobilinogen, UA: 0.2 mg/dL (ref 0.0–1.0)

## 2010-12-18 LAB — BASIC METABOLIC PANEL
BUN: 15 mg/dL (ref 6–23)
Calcium: 9.2 mg/dL (ref 8.4–10.5)
Creatinine, Ser: 0.73 mg/dL (ref 0.50–1.10)
GFR calc non Af Amer: 82 mL/min — ABNORMAL LOW (ref >90)
Glucose, Bld: 84 mg/dL (ref 70–99)

## 2010-12-18 LAB — PROTIME-INR: Prothrombin Time: 12.8 seconds (ref 11.6–15.2)

## 2010-12-20 LAB — CBC
HCT: 24.6 % — ABNORMAL LOW (ref 36.0–46.0)
Hemoglobin: 7.9 g/dL — ABNORMAL LOW (ref 12.0–15.0)
MCV: 94.6 fL (ref 78.0–100.0)
RBC: 2.6 MIL/uL — ABNORMAL LOW (ref 3.87–5.11)
RDW: 15.1 % (ref 11.5–15.5)
WBC: 10.3 10*3/uL (ref 4.0–10.5)

## 2010-12-20 LAB — BASIC METABOLIC PANEL WITH GFR
BUN: 9 mg/dL (ref 6–23)
CO2: 24 meq/L (ref 19–32)
Calcium: 7.8 mg/dL — ABNORMAL LOW (ref 8.4–10.5)
Chloride: 102 meq/L (ref 96–112)
Creatinine, Ser: 0.76 mg/dL (ref 0.50–1.10)
GFR calc Af Amer: >90 mL/min
GFR calc non Af Amer: 81 mL/min — ABNORMAL LOW
Glucose, Bld: 118 mg/dL — ABNORMAL HIGH (ref 70–99)
Potassium: 4.2 meq/L (ref 3.5–5.1)
Sodium: 131 meq/L — ABNORMAL LOW (ref 135–145)

## 2010-12-20 LAB — URINE CULTURE
Colony Count: NO GROWTH
Culture  Setup Time: 201210200140
Culture: NO GROWTH

## 2010-12-20 LAB — PREPARE RBC (CROSSMATCH)

## 2010-12-21 ENCOUNTER — Inpatient Hospital Stay (HOSPITAL_COMMUNITY): Payer: Medicare Other

## 2010-12-21 DIAGNOSIS — I509 Heart failure, unspecified: Secondary | ICD-10-CM

## 2010-12-21 DIAGNOSIS — R4182 Altered mental status, unspecified: Secondary | ICD-10-CM

## 2010-12-21 LAB — CBC
HCT: 30.4 % — ABNORMAL LOW (ref 36.0–46.0)
MCHC: 33.9 g/dL (ref 30.0–36.0)
Platelets: 99 10*3/uL — ABNORMAL LOW (ref 150–400)
RDW: 15 % (ref 11.5–15.5)
WBC: 9.1 10*3/uL (ref 4.0–10.5)

## 2010-12-21 LAB — PRO B NATRIURETIC PEPTIDE: Pro B Natriuretic peptide (BNP): 1772 pg/mL — ABNORMAL HIGH (ref 0–125)

## 2010-12-21 LAB — BASIC METABOLIC PANEL
BUN: 6 mg/dL (ref 6–23)
Chloride: 102 mEq/L (ref 96–112)
Creatinine, Ser: 0.57 mg/dL (ref 0.50–1.10)
GFR calc Af Amer: >90 mL/min (ref >90)
GFR calc non Af Amer: 89 mL/min — ABNORMAL LOW (ref >90)
Potassium: 4.1 mEq/L (ref 3.5–5.1)

## 2010-12-21 LAB — BLOOD GAS, ARTERIAL
Acid-Base Excess: 2.5 mmol/L — ABNORMAL HIGH (ref 0.0–2.0)
Drawn by: 129801
O2 Content: 2 L/min
Patient temperature: 98.6
TCO2: 25.1 mmol/L (ref 0–100)
pH, Arterial: 7.406 — ABNORMAL HIGH (ref 7.350–7.400)

## 2010-12-21 LAB — TYPE AND SCREEN: Antibody Screen: NEGATIVE

## 2010-12-21 LAB — MRSA PCR SCREENING: MRSA by PCR: NEGATIVE

## 2010-12-21 LAB — CARDIAC PANEL(CRET KIN+CKTOT+MB+TROPI)
CK, MB: 2.3 ng/mL (ref 0.3–4.0)
Troponin I: <0.3 ng/mL (ref <0.30)

## 2010-12-21 LAB — PROCALCITONIN: Procalcitonin: 0.13 ng/mL

## 2010-12-22 ENCOUNTER — Inpatient Hospital Stay (HOSPITAL_COMMUNITY): Payer: Medicare Other

## 2010-12-22 DIAGNOSIS — I509 Heart failure, unspecified: Secondary | ICD-10-CM

## 2010-12-22 DIAGNOSIS — R4182 Altered mental status, unspecified: Secondary | ICD-10-CM

## 2010-12-22 LAB — CARDIAC PANEL(CRET KIN+CKTOT+MB+TROPI)
CK, MB: 2 ng/mL (ref 0.3–4.0)
Relative Index: 1.8 (ref 0.0–2.5)
Total CK: 113 U/L (ref 7–177)
Troponin I: <0.3 ng/mL (ref <0.30)

## 2010-12-22 LAB — CBC
HCT: 26.9 % — ABNORMAL LOW (ref 36.0–46.0)
Hemoglobin: 9.7 g/dL — ABNORMAL LOW (ref 12.0–15.0)
MCH: 31.5 pg (ref 26.0–34.0)
MCV: 87.3 fL (ref 78.0–100.0)
RBC: 3.08 MIL/uL — ABNORMAL LOW (ref 3.87–5.11)

## 2010-12-22 LAB — COMPREHENSIVE METABOLIC PANEL
Albumin: 1.6 g/dL — ABNORMAL LOW (ref 3.5–5.2)
BUN: 5 mg/dL — ABNORMAL LOW (ref 6–23)
Calcium: 8 mg/dL — ABNORMAL LOW (ref 8.4–10.5)
Creatinine, Ser: 0.49 mg/dL — ABNORMAL LOW (ref 0.50–1.10)
Total Protein: 4.2 g/dL — ABNORMAL LOW (ref 6.0–8.3)

## 2010-12-22 LAB — BLOOD GAS, ARTERIAL
Acid-Base Excess: 4.4 mmol/L — ABNORMAL HIGH (ref 0.0–2.0)
Patient temperature: 97.7
TCO2: 24.5 mmol/L (ref 0–100)
pCO2 arterial: 30.9 mmHg — ABNORMAL LOW (ref 35.0–45.0)

## 2010-12-22 LAB — TSH
TSH: 0.48 u[IU]/mL (ref 0.350–4.500)
TSH: 0.346 u[IU]/mL — ABNORMAL LOW (ref 0.350–4.500)

## 2010-12-22 NOTE — Op Note (Signed)
[name], [name]             ACCOUNT NO.:  [id number]  MEDICAL RECORD NO.:  [id number]  LOCATION:  1238                         FACILITY:  [facility]  PHYSICIAN:  Jene Every, M.D.    DATE OF BIRTH:  [birth year]  DATE OF PROCEDURE:  12/18/2010 DATE OF DISCHARGE:                              OPERATIVE REPORT   PREOPERATIVE DIAGNOSIS:  Left intertrochanteric-subtrochanteric fracture.  POSTOPERATIVE DIAGNOSIS:  Left intertrochanteric-subtrochanteric fracture.  PROCEDURE PERFORMED:  Intramedullary nailing of left femur utilizing AFFIXUS, DePuy rod 380 mm in length, 11 mm in diameter.  HISTORY:  A 76 year old, fell off a table today, sustaining a fracture of the intertrochanteric region; history of breast cancer, undergoing chemo and radiation for the breast; indicated for intramedullary nailing.  Risks and benefits discussed including bleeding, infection, damage to other structures, DVT, PE, anesthetic complication, malunion, nonunion, need for revision to total hip.  TECHNIQUE:  The patient was placed in a supine position.  After induction of general anesthesia, 1 g of vancomycin, placed on the fracture table.  Bony prominence were well padded.  Peroneal post padded, Foley to gravity.  While leg in gentle, abduction external rotation, padded well and secured, left lower extremity in gentle longitudinal traction, internal rotation, reduced on x-ray under C-arm augmentation.  It was near anatomic.  There was a fair comminution of the intertrochanteric region and the subtroch.  Left hip peritrochanteric region was prepped and draped in the usual sterile fashion.  Small incision was then made proximal to greater trochanter. Subcutaneous tissue was dissected by cautery to achieve hemostasis.  The fascia lata identified by the skin incision.  It was approximately 3 cm proximal to the tip of the greater tuberosity.  Used the guide pin, advanced it into the femoral shaft to the tip  of the trochanter where there was fair amount of comminution and guided into the intramedullary canal utilizing x-ray.  We overreamed it proximally.  Did a proximal fragment displaced laterally.  We, therefore, had medialized that and just passed that into the canal.  We reamed through a 10, 11, 12, and 13 mm with good cortical contact to receive an 11 mm rod.  She had history of osteoporosis.  We tried to use a long rod and lock it distally.  I then secured the particular rod and advanced into the femoral canal without difficulty.  Rotating appropriately down to the distal femur and set flush with the tip of the trochanter.  The AP and lateral plane was satisfactory with an excellent fit.  We then fixed proximally with the lag screw.  The external alignment guide of the incision was made over the skin, bluntly dissected down to the lateral aspect of the femur after a small incision.  Used a trocar, guided a guide pin up the femoral head and neck.  This was satisfactory in the AP and lateral plane.  This was reamed into a 95 lag screw.  Inserted and placed with excellent purchase.  Used a set screw proximally.  Disengaged the external alignment with excellent purchase in the AP and lateral plane. We had to compress slightly at the fracture site prior to this as well. Copiously irrigated the wound.  The  attention turned distally towards the locking and with the C-arm in the position, we triangulated utilizing the radiolucent guide and then drilled the 2 holes distally and the small incisions were made, and blunt dissection down to the femur.  The first drill hole, there was slight comminution of the lateral aspect of the cortex of the femur.  I then placed 2 screws there.  For fixation, we had a better fixation of the distal one and then the proximal one.  We felt this would help with subsidence and rotation.  She has a fairly thin cortex.  It did appear to provide [place] satisfactory  fixation.  I removed the screwdriver external in AP and lateral planes.  Satisfactory reduction of the fracture and placement of the rod.  All wounds copiously irrigated proximally.  We repaired the fascia lata with #1 Vicryl in a figure-of-8 sutures, subcu with 2-0 Vicryl simple sutures.  Skin was reapproximated staples.  All the wounds were closed in a similar fashion with staples on the wounds.  We achieved meticulous hemostasis with electrocautery.  Wound dressed sterilely.  She was removed from the fracture table.  We had actually released traction just prior to engagement of the lag screw too.  She was placed on the bed.  Leg lengths were equivalent, good pulses.  Extubated without difficulty and transported to recovery room in satisfactory condition.  The patient tolerated the procedure well.  No complications.  Assistant was operating room attendant, 100 cc of blood loss.     Jene Every, M.D.     [name]  D:  12/18/2010  T:  12/19/2010  Job:  [id number]  Electronically Signed by Jene Every M.D. on 12/22/2010 01:25:59 PM

## 2010-12-22 NOTE — H&P (Signed)
  NAMEAURORE, REDINGER NO.:  [id number]  MEDICAL RECORD NO.:  [id number]  LOCATION:  WLED                         FACILITY:  [facility]  PHYSICIAN:  Jene Every, M.D.    DATE OF BIRTH:  04-Jun-1934  DATE OF ADMISSION:  12/18/2010 DATE OF DISCHARGE:                             HISTORY & PHYSICAL   CHIEF COMPLAINT:  Left hip pain.  HISTORY:  A 76 year old female, who fell today at the [facility] after the table was raised, unaware that had not been lowered.  She just fell down on the ground on to the left hip, had acute pain and deformity, is seen in the emergency room, found to have a comminuted intertrochanteric hip fracture.  I was consulted for orthopedic management of her hip fracture.  The patient reports no numbness and tingling in the lower extremities, fevers, or chills.  She is currently undergoing radiation treatment with Dr. Dayton Scrape and has last had chemotherapy in July 2012.  PAST MEDICAL HISTORY:  Significant for breast cancer, fibromyalgia, hypothyroidism, neuropathy, and osteoporosis.  ALLERGIES:  PENICILLIN, generating anaphylaxis.  SOCIAL HISTORY:  Married.  Negative tobacco.  Negative EtOH.  MEDICATIONS:  Cymbalta, Benicar, Neurontin, levothyroxine, Ativan, Bactroban, Ambien, benzonatate.  PHYSICAL EXAMINATION:  GENERAL:  Healthy female in moderate distress and affect appropriate.  Left lower extremity is externally rotated and shortened.  She is alert and oriented. HEENT:  Normocephalic, atraumatic.  Thoracic and lumbar spine, nontender. COR:  Regular rate and rhythm. PULMONARY:  Clear to auscultation. ABDOMEN:  Soft, nontender.  Positive bowel sounds. PELVIS:  Stable. EXTREMITIES:  Left lower extremity, externally rotated and shortened. Good dorsiflexion, plantar flexion.  1+ dorsalis pedis, posterior tibial pulse.  Knee and calf exam is unremarkable. NEUROLOGIC:  Sensory exam is intact.  X-rays of the hip demonstrate comminuted  displaced intertrochanteric- subtrochanteric hip fracture,  some varus deformity.  LABORATORY DATA:  Sodium 137, potassium 4.0, glucose 84, BUN 15, and creatinine 0.73.  CBC:  WBC 7.0, hemoglobin 12.9.  INR 0.94.  Chest x-ray demonstrated no active disease.  EKG is unremarkable, shows a sinus rhythm.  IMPRESSION:  Acute left intertrochanteric hip fracture.  History of osteoporosis and breast cancer, currently undergoing radiation treatment, not to the hip, has chemotherapy as recent as July 2012.  No history of deep venous thrombosis or pulmonary embolism, nonsmoker.  PLAN:  Extensive discussion with the seniors in the family concerning current pathology, role of the anatomy, and treatment.  We discussed intertrochanteric nailing of the trochanteric fracture and the risks and benefits including bleeding, infection, malunion, nonunion, need for revision, total hip, DVT, PE, anesthetic complications, as well as infection.  She has a history of anaphylaxis, was probable MRSA exposure due to her immunocompromised state and chemotherapy.  We will give her vancomycin.  We will proceed accordingly.     Jene Every, M.D.     Cordelia Pen  D:  12/18/2010  T:  12/18/2010  Job:  [id number]  Electronically Signed by Jene Every M.D. on 12/22/2010 01:25:57 PM

## 2010-12-23 ENCOUNTER — Inpatient Hospital Stay (HOSPITAL_COMMUNITY): Payer: Medicare Other

## 2010-12-23 DIAGNOSIS — S72143A Displaced intertrochanteric fracture of unspecified femur, initial encounter for closed fracture: Secondary | ICD-10-CM

## 2010-12-23 LAB — BASIC METABOLIC PANEL
BUN: 10 mg/dL (ref 6–23)
CO2: 27 mEq/L (ref 19–32)
Calcium: 7.7 mg/dL — ABNORMAL LOW (ref 8.4–10.5)
Chloride: 105 mEq/L (ref 96–112)
Creatinine, Ser: 0.51 mg/dL (ref 0.50–1.10)
Glucose, Bld: 109 mg/dL — ABNORMAL HIGH (ref 70–99)

## 2010-12-23 LAB — CBC
Hemoglobin: 9.1 g/dL — ABNORMAL LOW (ref 12.0–15.0)
MCH: 30.6 pg (ref 26.0–34.0)
MCHC: 35.3 g/dL (ref 30.0–36.0)
Platelets: 100 10*3/uL — ABNORMAL LOW (ref 150–400)
RBC: 2.97 MIL/uL — ABNORMAL LOW (ref 3.87–5.11)

## 2010-12-23 LAB — COMPREHENSIVE METABOLIC PANEL
ALT: 23 U/L (ref 0–35)
AST: 23 U/L (ref 0–37)
Alkaline Phosphatase: 75 U/L (ref 39–117)
Calcium: 8.2 mg/dL — ABNORMAL LOW (ref 8.4–10.5)
Potassium: 2.9 mEq/L — ABNORMAL LOW (ref 3.5–5.1)
Sodium: 137 mEq/L (ref 135–145)
Total Protein: 4.6 g/dL — ABNORMAL LOW (ref 6.0–8.3)

## 2010-12-24 LAB — CBC
HCT: 22.7 % — ABNORMAL LOW (ref 36.0–46.0)
MCH: 31.2 pg (ref 26.0–34.0)
MCHC: 34.8 g/dL (ref 30.0–36.0)
MCV: 89.7 fL (ref 78.0–100.0)
RDW: 14.8 % (ref 11.5–15.5)

## 2010-12-24 LAB — BASIC METABOLIC PANEL
BUN: 12 mg/dL (ref 6–23)
Calcium: 7.8 mg/dL — ABNORMAL LOW (ref 8.4–10.5)
Chloride: 105 mEq/L (ref 96–112)
Creatinine, Ser: 0.6 mg/dL (ref 0.50–1.10)
GFR calc Af Amer: >90 mL/min (ref >90)

## 2010-12-25 ENCOUNTER — Inpatient Hospital Stay (HOSPITAL_COMMUNITY)
Admission: RE | Admit: 2010-12-25 | Discharge: 2011-01-12 | DRG: 945 | Disposition: A | Discharge status: Home Health | Payer: Medicare Other | Source: Ambulatory Visit | Attending: Physical Medicine & Rehabilitation | Admitting: Physical Medicine & Rehabilitation

## 2010-12-25 DIAGNOSIS — Z87891 Personal history of nicotine dependence: Secondary | ICD-10-CM

## 2010-12-25 DIAGNOSIS — IMO0002 Reserved for concepts with insufficient information to code with codable children: Secondary | ICD-10-CM

## 2010-12-25 DIAGNOSIS — C50919 Malignant neoplasm of unspecified site of unspecified female breast: Secondary | ICD-10-CM | POA: Diagnosis present

## 2010-12-25 DIAGNOSIS — S72143A Displaced intertrochanteric fracture of unspecified femur, initial encounter for closed fracture: Secondary | ICD-10-CM

## 2010-12-25 DIAGNOSIS — IMO0001 Reserved for inherently not codable concepts without codable children: Secondary | ICD-10-CM | POA: Diagnosis present

## 2010-12-25 DIAGNOSIS — D62 Acute posthemorrhagic anemia: Secondary | ICD-10-CM | POA: Diagnosis not present

## 2010-12-25 DIAGNOSIS — W08XXXA Fall from other furniture, initial encounter: Secondary | ICD-10-CM | POA: Diagnosis present

## 2010-12-25 DIAGNOSIS — I959 Hypotension, unspecified: Secondary | ICD-10-CM | POA: Diagnosis not present

## 2010-12-25 DIAGNOSIS — Z8711 Personal history of peptic ulcer disease: Secondary | ICD-10-CM

## 2010-12-25 DIAGNOSIS — Z5189 Encounter for other specified aftercare: Principal | ICD-10-CM

## 2010-12-25 DIAGNOSIS — J40 Bronchitis, not specified as acute or chronic: Secondary | ICD-10-CM | POA: Diagnosis present

## 2010-12-25 DIAGNOSIS — T451X5A Adverse effect of antineoplastic and immunosuppressive drugs, initial encounter: Secondary | ICD-10-CM | POA: Diagnosis present

## 2010-12-25 DIAGNOSIS — R7989 Other specified abnormal findings of blood chemistry: Secondary | ICD-10-CM | POA: Clinically undetermined

## 2010-12-25 DIAGNOSIS — E039 Hypothyroidism, unspecified: Secondary | ICD-10-CM | POA: Diagnosis present

## 2010-12-25 DIAGNOSIS — Z79899 Other long term (current) drug therapy: Secondary | ICD-10-CM

## 2010-12-25 DIAGNOSIS — K589 Irritable bowel syndrome without diarrhea: Secondary | ICD-10-CM | POA: Diagnosis present

## 2010-12-25 DIAGNOSIS — E2740 Unspecified adrenocortical insufficiency: Secondary | ICD-10-CM

## 2010-12-25 DIAGNOSIS — S72141A Displaced intertrochanteric fracture of right femur, initial encounter for closed fracture: Secondary | ICD-10-CM | POA: Diagnosis present

## 2010-12-25 DIAGNOSIS — I951 Orthostatic hypotension: Secondary | ICD-10-CM | POA: Diagnosis not present

## 2010-12-25 DIAGNOSIS — G620 Drug-induced polyneuropathy: Secondary | ICD-10-CM | POA: Diagnosis present

## 2010-12-25 DIAGNOSIS — S7223XA Displaced subtrochanteric fracture of unspecified femur, initial encounter for closed fracture: Secondary | ICD-10-CM | POA: Diagnosis present

## 2010-12-25 DIAGNOSIS — E876 Hypokalemia: Secondary | ICD-10-CM | POA: Diagnosis present

## 2010-12-25 LAB — CBC
HCT: 24.6 % — ABNORMAL LOW (ref 36.0–46.0)
MCHC: 33.3 g/dL (ref 30.0–36.0)
Platelets: 135 10*3/uL — ABNORMAL LOW (ref 150–400)
RDW: 15.2 % (ref 11.5–15.5)

## 2010-12-25 LAB — BASIC METABOLIC PANEL
BUN: 12 mg/dL (ref 6–23)
Calcium: 8.3 mg/dL — ABNORMAL LOW (ref 8.4–10.5)
Chloride: 102 mEq/L (ref 96–112)
Creatinine, Ser: 0.56 mg/dL (ref 0.50–1.10)
Creatinine, Ser: 0.62 mg/dL (ref 0.50–1.10)
GFR calc Af Amer: >90 mL/min (ref >90)
GFR calc Af Amer: >90 mL/min (ref >90)
GFR calc non Af Amer: 90 mL/min — ABNORMAL LOW (ref >90)
Potassium: 2.7 mEq/L — CL (ref 3.5–5.1)
Sodium: 137 mEq/L (ref 135–145)

## 2010-12-25 LAB — URINALYSIS, ROUTINE W REFLEX MICROSCOPIC
Ketones, ur: NEGATIVE mg/dL
Nitrite: POSITIVE — AB
Protein, ur: NEGATIVE mg/dL

## 2010-12-25 NOTE — Discharge Summary (Signed)
[name], [name]             ACCOUNT NO.:  [id number]  MEDICAL RECORD NO.:  [id number]  LOCATION:  1407                         FACILITY:  [facility]  PHYSICIAN:  Tera Mater. [name], M.D. DATE OF BIRTH:  11-Dec-1934  DATE OF ADMISSION:  12/18/2010 DATE OF DISCHARGE:  12/25/2010                              DISCHARGE SUMMARY   DISCHARGE DIAGNOSES:  Are as follows: 1. Fall with hip fracture and repair. 2. Severe hypotension requiring IV pressors, now clinically resolved. 3. Relative adrenal insufficiency, now on therapy. 4. Hypertension with medications now on hold. 5. Persistent hypokalemia probably due to fluids and steroids. 6. Breast cancer, still getting radiation therapy. 7. Altered mental status, improved. 8. Protein calorie malnutrition. 9. Significant anemia with no evidence of acute bleeding. 10.Primary hypothyroidism. 11.Conjunctivitis, under treatment. 12.Bronchitis, under treatment.  PROCEDURES:  Included a intramedullary nailing of the left femur on October 19 by Dr. Jene Every.  CONSULTATIONS:  The admitting team was the orthopedic team, then the patient was transferred to the critical care team and spent some time in the ICU and then we were consulted.  She has also been seen by [facility] during this hospitalization.  PROCEDURES:  Included the hip repair.  She also had a CT of the abdomen, CT of the chest as well.  [name] is a 76 year old white female, who was at radiation therapy and fell off the table there and suffered a hip fracture.  She was admitted by the orthopedic team and underwent surgery which went well initially.  The patient then became fairly profoundly hypotensive and was transferred to the intensive care unit on October 21, placed on high- dose pressors.  It took [place] a while to wean off the Neo-Synephrine that she was getting. During this episode, a cortisol was checked, it was random that was only 14 suggesting a  relative adrenal insufficiency, and she has been on steroids which seems to help. She also required significant fluid resuscitation. The patient had significant encephalopathy and confusion. A workup thus was done and seems to be medication related that completely resolved. The patient has done well from a hip fracture standpoint. She really has not started rehab yet, however. She did have some anemia requiring transfusion and blood counts are still running a bit low. She has also had a potassium that is persistently low. She has had mild thrombocytopenia noted as well, but this is mostly resolved. She was seen by the cardiac team due to the persistent hypotension and recent syncope. They did not feel like any other tests needed to be done. There was some evidence of possible fluid overload part of the way through her hospitalization. At the present time, she is remarkably better. We will wean down the fluids. We have advanced her diet. She is feeling well. The only new problems are: She has now got productive cough with some yellow sputum, suggesting a bronchitis. Given her immunosuppressed state, we are going to treat this. Her eye infection is clearing up. She is eating fairly well. Her last bowel movement was documented yesterday.  LABORATORY DATA:  Her sodium is 130, potassium 2.7, chloride 104, CO2 29, BUN 12, creatinine 0.56, estimated GFR  90, glucose is 94, calcium is 8.2. Her white count is 4600, hemoglobin 8.2, MCV 91.4, platelets 135,000. A TSH on the 23rd was 0.346. Two blood cultures were negative on the 22nd. A procalcitonin was less than 0.10. A blood gas pH 7.544, pCO2 of 31, PO2 of 68. Earlier, TSH was 0.480. Cardiac panel on the 23rd showed troponin less than 0.30, a CK of 113. MRSA screening was negative. BNP was 1772 and ammonia was 17. Venous lactic acid was 0.8. Random cortisol was 14.1. Earlier blood gas pH 7.406, PO2 of 44, PO2 of 93. Of note, she did get blood on  21st. Urine culture on the 19th was no growth. At presentation, her white count was 7000, hemoglobin 12.9. Her INR was 0.94. Sodium was 137, potassium 4, chloride 102, CO2 of 22, BUN 15, creatinine 0.73, glucose of 84. A CMET did show also on the 23rd that her albumin is low at 1.6 with total protein 4.2.  RADIOLOGY STUDIES:  Chest x-ray on the 24th was mild edema, small left pleural effusion probably left basilar atelectasis. A CT of the abdomen to make sure there was not a metastatic disease. Adrenal gland showed the both adrenal glands are normal in appearance. Small hiatal hernia and the small bilateral pleural effusions are noted. Some third spacing was noted. A CT of the head on the 23rd, was no acute intracranial findings. No evidence of trauma. A chest x-ray on the 23rd showed the jugular line over the SVC and diffuse interstitial changes. Femur fracture showed successful placement of the IM rod and hip screw.  SUMMARY:  We have a 76 year old white female presenting after a fall with a hip fracture, subsequently postoperatively had significant hypotension requiring pressors. At the present time, she has recovered very nicely. No evidence of an infectious process here. She does have 3 or 4 significant persistent issues. First of all, she has 6 more radiation therapies coming that need to be done. Those will be arranged while she is in inpatient rehab. Secondly, she has this persistent hypokalemia. Unfortunately, I already signed off on the medication reconciliation form and we need to add 2 medications which are noted below. She does need PT and OT. She will need to be on Lovenox and the orthopedist will decide whether she needs to be transitioned to Coumadin. We will need to follow her anemia and she may require more transfusions. We also have this additional issue of adrenal insufficiency. Note my recommendations down below.  DIET:  Regular.  ACTIVITY:  With PT and  OT.  MEDICATIONS AT DISCHARGE:  Include: 1. Ciprofloxacin 0.3% ophthalmic solution, 2 drops in both eyes every     4 hours while awake for an additional 3 days. 2. She is on Lovenox 40 mg q. 24 hours, and I do not see a note from     the orthopedist as to whether she needs to be transitioned to     Coumadin or not. She is getting hydrocodone APAP 5/325 1-2 q.4 as     needed. She is on Protonix 40 daily. Right now, she is on     prednisone 20 daily. In another 3 days, I would go down to 10 mg     daily and probably leave her on that until I see her back for     outpatient followup. She has newly started on Avelox 400 once daily     for 7 days for the bronchitis. 3. She is on Ativan 0.5 mg  every 8 hours as needed. 4. She is on Cymbalta 30 once daily. 5. She is on gabapentin 300 mg 3 times daily. 6. She is on prenatal vitamins daily. 7. She is on Synthroid 88 mcg daily. 8. Tessalon Perles 100 mg 2 capsules every 6 as needed. 9. Vitamin D 45409 units. 10.Vytorin 10/80 one at bedtime. 11.Ambien 10 mg daily at bedtime. New medications that I cannot put into the form because this has already signed out include: 1. Magnesium oxide 400 twice daily. 2. Potassium 40 mEq twice daily with repeat BMET needed The patient has been a full code status here.          ______________________________ Tera Mater. [name], M.D.     SAS/MEDQ  D:  12/25/2010  T:  12/25/2010  Job:  [id number]  Electronically Signed by Adrian Prince M.D. on 12/25/2010 02:08:32 PM

## 2010-12-26 DIAGNOSIS — S72143A Displaced intertrochanteric fracture of unspecified femur, initial encounter for closed fracture: Secondary | ICD-10-CM

## 2010-12-26 LAB — URINE CULTURE

## 2010-12-26 LAB — BASIC METABOLIC PANEL
BUN: 14 mg/dL (ref 6–23)
CO2: 28 mEq/L (ref 19–32)
Calcium: 8.8 mg/dL (ref 8.4–10.5)
Creatinine, Ser: 0.76 mg/dL (ref 0.50–1.10)
Glucose, Bld: 86 mg/dL (ref 70–99)

## 2010-12-27 LAB — CULTURE, BLOOD (ROUTINE X 2)
Culture  Setup Time: 201210222301
Culture: NO GROWTH
Culture: NO GROWTH

## 2010-12-27 LAB — CBC
Hemoglobin: 9.7 g/dL — ABNORMAL LOW (ref 12.0–15.0)
MCH: 30.6 pg (ref 26.0–34.0)
MCHC: 33.1 g/dL (ref 30.0–36.0)
MCV: 92.4 fL (ref 78.0–100.0)
Platelets: 194 10*3/uL (ref 150–400)

## 2010-12-28 ENCOUNTER — Other Ambulatory Visit: Payer: Self-pay

## 2010-12-28 DIAGNOSIS — S72143A Displaced intertrochanteric fracture of unspecified femur, initial encounter for closed fracture: Secondary | ICD-10-CM

## 2010-12-28 LAB — DIFFERENTIAL
Basophils Relative: 0 % (ref 0–1)
Eosinophils Absolute: 0.1 10*3/uL (ref 0.0–0.7)
Eosinophils Relative: 1 % (ref 0–5)
Monocytes Relative: 9 % (ref 3–12)
Neutrophils Relative %: 72 % (ref 43–77)

## 2010-12-28 LAB — COMPREHENSIVE METABOLIC PANEL
ALT: 50 U/L — ABNORMAL HIGH (ref 0–35)
AST: 33 U/L (ref 0–37)
Albumin: 2.4 g/dL — ABNORMAL LOW (ref 3.5–5.2)
CO2: 31 mEq/L (ref 19–32)
Calcium: 9 mg/dL (ref 8.4–10.5)
Creatinine, Ser: 0.76 mg/dL (ref 0.50–1.10)
Sodium: 137 mEq/L (ref 135–145)
Total Protein: 5 g/dL — ABNORMAL LOW (ref 6.0–8.3)

## 2010-12-28 LAB — CBC
MCH: 30.9 pg (ref 26.0–34.0)
MCHC: 33 g/dL (ref 30.0–36.0)
MCV: 93.6 fL (ref 78.0–100.0)
Platelets: 233 10*3/uL (ref 150–400)
RBC: 3.14 MIL/uL — ABNORMAL LOW (ref 3.87–5.11)
RDW: 16.5 % — ABNORMAL HIGH (ref 11.5–15.5)

## 2010-12-29 DIAGNOSIS — C50919 Malignant neoplasm of unspecified site of unspecified female breast: Secondary | ICD-10-CM

## 2010-12-29 DIAGNOSIS — S72143A Displaced intertrochanteric fracture of unspecified femur, initial encounter for closed fracture: Secondary | ICD-10-CM

## 2010-12-29 DIAGNOSIS — I951 Orthostatic hypotension: Secondary | ICD-10-CM

## 2010-12-29 NOTE — H&P (Signed)
Kelsey, [name] NO.:  [id number]  MEDICAL RECORD NO.:  [id number]  LOCATION:  4001                         FACILITY:  [facility]  PHYSICIAN:  [name], M.D.DATE OF BIRTH:  [birth year]  DATE OF ADMISSION:  12/25/2010 DATE OF DISCHARGE:                             HISTORY & PHYSICAL   REASON FOR ADMISSION:  Rehabilitation for left hip fracture.  HISTORY:  A 76 year old female with prior history of fibromyalgia and irritable bowel, recurrent syncope as well as breast carcinoma requiring radiation therapy.  She fell off the radiation therapy table at the [facility] on December 18, 2010, and sustained a left IT hip fracture. She underwent left hip IM nailing December 18, 2010, per Dr. Paula [name], postoperatively was made touchdown weightbearing and placed on subcu Lovenox for DVT prophylaxis.  On December 20, 2010, the patient had a hemoglobin of 7.9 felt to be due to acute blood loss anemia, and she was transfused with 2 units of packed red blood cells.  She did develop hypotension and progressive confusion requiring pressor agents on December 21, 2010.  Critical Care Medicine was consulted for management of shock secondary to volume depletion and questionable adrenal insufficiency.  Cardiology was consulted for input on hypotension.  The patient has a history of recurrent syncope secondary to orthostatic hypotension.  This was felt to be due to volume depletion due to large volume diarrhea, which is associated with her irritable bowel syndrome. The patient __________ type 2, the first set was negative, and the second set is pending.  She has had issues with hypokalemia with potassium dropping from 2.9 to 2.7, and supplementation ongoing. Thrombocytopenia has been resolving and hit at nadir of 78 and is up to 135 today.  Because of coughing and suspected bronchitis, the patient was started on Avelox but unclear whether she received her dose  today.  Chest x-ray December 23, 2010, mild edema left small pleural effusion.  REVIEW OF SYSTEMS:  Positive for diarrhea.  Negative for breathing difficulties, however, positive for cough.  Negative for GU difficulties.  Negative for uncontrolled pain.  Review of systems otherwise negative.  PAST HISTORY:  As noted above: 1. Breast carcinoma, lumpectomy, sentinel node biopsy in August 2012,     had chemotherapy and now with radiation therapy. 2. History of irritable bowel. 3. Fibromyalgia. 4. History of recurrent syncope. 5. Obstructive sleep apnea. 6. Peptic ulcer disease.  PAST SURGICAL HISTORY:  L4-5 decompression and laminectomy in 2005, TAH- BSO, moderate hiatal hernia.  FAMILY HISTORY:  COPD and cancer.  SOCIAL HISTORY:  Married, independent prior to admission.  Lives in 1- level home, 4 steps to enter.  Tobacco quit 23 years ago.  EtOH.  Two to four ounces of wine per week.  FUNCTIONAL HISTORY:  Independent prior to admission but sedentary secondary to radiation therapy just completed home health PT prior to admission.  HOME MEDICATIONS: 1. Gabapentin 200-300 mg t.i.d. 2. Benicar 20 mg 1-1/2 p.o. every other day. 3. Cymbalta 30 mg p.o. daily. 4. Tessalon Perles p.r.n. 5. Vytorin 10/80 at bedtime. 6. RadiaPlex wound gel daily. 7. Ativan 0.5 mg q.8 h. p.r.n. 8. Synthroid 88 mcg daily. 9. Vitamin D  50,000 units q. weekly. 10.Ambien 10 mg p.o. at bedtime p.r.n.  LABORATORY DATA:  Last BUN 12, creatinine 0.56.  Sodium 138, potassium 2.7 as of December 25, 2010.  Platelets 135,000, white count 4.6, hemoglobin 10.2, TSH was 0.346 on December 22, 2010.  PHYSICAL EXAMINATION:  VITAL SIGNS:  Blood pressure 150/67, pulse 76, respirations 20, temp 97.8. GENERAL:  This is a well-developed, well-nourished female, in no acute distress. EYES:  Anicteric, noninjected. EXTERNAL ENT:  Negative. NECK:  Supple without adenopathy. RESPIRATIONS:  Respiratory effort is good.  Lungs  are clear. HEART:  Regular rate and rhythm.  No rubs, murmurs, or extra sounds. She does have 1+ pitting edema in bilateral lower extremities pretibially. ABDOMEN:  Positive bowel sounds.  Soft, nontender to palpation. NEUROLOGIC:  Motor strength is 5/5 bilateral deltoid, biceps, triceps, grip.  She has 1/5 left hip flexor and quad, 4 at the TA and gastroc, 4 throughout the right lower extremity.  Memory, mood, affect, orientation are all intact.  Sensation:  Has paresthesias tips of the fingers with tingling, with rubbing and with light touch on the toes.  Cranial nerves II-XII are intact.  Refer to the written H and P for further details.  POST ADMISSION PHYSICIAN EVALUATION: 1. Functional deficits secondary to left intertrochanteric femur     fracture status post fall on December 18, 2010, with postoperative     complications of hypovolemic shock and acute blood loss anemia. 2. The patient admitted to receive collaborative interdisciplinary     care between the physiatrist, rehab nursing staff, and the therapy     team. 3. The patient's level of medical complexity and substantial therapy     needs in context of that medical necessity cannot be provide at a     lesser intensity of care. 4. The patient has experienced substantial functional loss from her     baseline.  Upon functional assessment at the time of preadmission     screening, the patient was at a mod assist bed mobility and mod     transfers, mod assist upper body, max assist lower body dressing.     Judging by the patient's diagnosis, physical exam and functional     history, the patient has a potential for functional progress, which     will result in measurable gains while in inpatient rehab.  These     gains will be of substantial and practical use upon discharge to     home facilitating mobility, self-care and independence.  Interim     changes in medical status since preadmission screening are detailed     in the  history of present illness. 5. Physiatrist to provide 24 hour management of medical needs as well     as oversight of the therapy plan/treatment and provide guidance     appropriate regarding interactions of the two. 6. A 24-hour rehab nursing will assist in the management of skin,     bowel, bladder, and help integrate therapy, concepts, techniques,     and education. 7. PT will assess and treat for pre gait training, gait training,     endurance, and mobility.  Goals are for modified independent level     with supervision to modified independent level with mobility. 8. OT will assess and treat for ADLs, safety, equipment, endurance.     Goals are for supervision to modified independent level for ADLs. 9. Case Management and Social Work will assess and treat for     psychosocial  issues and discharge planning. 10.Team conference will be held weekly to assess the patient's     progress/goals and to determine barriers to discharge. 11.The patient has demonstrated sufficient medical stability and     exercise capacity to tolerate at least 3 hours of therapy per day     at least 5 days per week. 12.Estimated length of stay is 10 days.  Prognosis for further     functional improvement is good.  MEDICAL PROBLEM LIST AND PLAN: 1. DVT prophylaxis on subcu Lovenox. 2. Bronchitis on Enablex 1/7. 3. Acute blood loss anemia.  Recheck hemoglobin in the morning, add     iron supplement. 4. Hypokalemia persistent, increase her potassium supplementation. 5. 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  Her     Cymbalta was resumed.  We will also resume her gabapentin. 6. Motivation and mood appear to be adequate for full participation in     inpatient rehabilitation program.  Rehab Medicine Services were     discussed with the patient.  Questions answered.     [name], M.D.     AEK/MEDQ  D:  12/25/2010  T:  12/26/2010  Job:  [id number]  cc:   Jeannett Senior A. [name],  M.D. Jene Every, M.D.  Electronically Signed by Claudette Laws M.D. on 12/29/2010 12:33:05 PM

## 2010-12-30 ENCOUNTER — Inpatient Hospital Stay (HOSPITAL_COMMUNITY): Payer: Medicare Other

## 2010-12-30 LAB — CBC
HCT: 30.3 % — ABNORMAL LOW (ref 36.0–46.0)
Hemoglobin: 9.7 g/dL — ABNORMAL LOW (ref 12.0–15.0)
MCH: 31 pg (ref 26.0–34.0)
MCHC: 32 g/dL (ref 30.0–36.0)
MCV: 96.8 fL (ref 78.0–100.0)
RBC: 3.13 MIL/uL — ABNORMAL LOW (ref 3.87–5.11)

## 2010-12-30 LAB — BASIC METABOLIC PANEL
BUN: 17 mg/dL (ref 6–23)
CO2: 30 mEq/L (ref 19–32)
Calcium: 8.8 mg/dL (ref 8.4–10.5)
GFR calc non Af Amer: 66 mL/min — ABNORMAL LOW (ref >90)
Glucose, Bld: 104 mg/dL — ABNORMAL HIGH (ref 70–99)

## 2010-12-30 MED ORDER — GADOBENATE DIMEGLUMINE 529 MG/ML IV SOLN
13.0000 mL | Freq: Once | INTRAVENOUS | Status: AC
  Administered 2010-12-30: 13 mL via INTRAVENOUS

## 2011-01-01 DIAGNOSIS — S72143A Displaced intertrochanteric fracture of unspecified femur, initial encounter for closed fracture: Secondary | ICD-10-CM

## 2011-01-01 DIAGNOSIS — C50919 Malignant neoplasm of unspecified site of unspecified female breast: Secondary | ICD-10-CM

## 2011-01-01 DIAGNOSIS — I951 Orthostatic hypotension: Secondary | ICD-10-CM

## 2011-01-01 LAB — TSH: TSH: 4.029 u[IU]/mL (ref 0.350–4.500)

## 2011-01-01 MED ORDER — PROMETHAZINE HCL 12.5 MG RE SUPP: 12.5000 mg | Freq: Four times a day (QID) | RECTAL | Status: DC | PRN

## 2011-01-01 MED ORDER — ENOXAPARIN SODIUM 40 MG/0.4ML ~~LOC~~ SOLN
40.0000 mg | SUBCUTANEOUS | Status: DC
  Administered 2011-01-03 – 2011-01-12 (×10): 40 mg via SUBCUTANEOUS
  Filled 2011-01-01 (×13): qty 0.4

## 2011-01-01 MED ORDER — DULOXETINE HCL 30 MG PO CPEP
30.0000 mg | ORAL_CAPSULE | Freq: Every day | ORAL | Status: DC
  Administered 2011-01-03 – 2011-01-11 (×9): 30 mg via ORAL
  Filled 2011-01-01 (×13): qty 1

## 2011-01-01 MED ORDER — LEVOTHYROXINE SODIUM 88 MCG PO TABS
88.0000 ug | ORAL_TABLET | Freq: Every day | ORAL | Status: DC
  Administered 2011-01-03 – 2011-01-12 (×11): 88 ug via ORAL
  Filled 2011-01-01 (×14): qty 1

## 2011-01-01 MED ORDER — EZETIMIBE 10 MG PO TABS
10.0000 mg | ORAL_TABLET | Freq: Every day | ORAL | Status: DC
  Administered 2011-01-03 – 2011-01-11 (×9): 10 mg via ORAL
  Filled 2011-01-01 (×12): qty 1

## 2011-01-01 MED ORDER — PROMETHAZINE HCL 25 MG/ML IJ SOLN: 12.5000 mg | Freq: Four times a day (QID) | INTRAMUSCULAR | Status: DC | PRN

## 2011-01-01 MED ORDER — ACETAMINOPHEN 325 MG PO TABS: 325.0000 mg | ORAL_TABLET | ORAL | Status: DC | PRN

## 2011-01-01 MED ORDER — LORAZEPAM 0.5 MG PO TABS: 0.5000 mg | ORAL_TABLET | Freq: Three times a day (TID) | ORAL | Status: DC | PRN

## 2011-01-01 MED ORDER — GUAIFENESIN-DM 100-10 MG/5ML PO SYRP: 5.0000 mL | ORAL_SOLUTION | Freq: Four times a day (QID) | ORAL | Status: DC | PRN

## 2011-01-01 MED ORDER — METHOCARBAMOL 500 MG PO TABS
500.0000 mg | ORAL_TABLET | Freq: Four times a day (QID) | ORAL | Status: DC | PRN
  Administered 2011-01-04 – 2011-01-10 (×8): 500 mg via ORAL
  Filled 2011-01-01 (×3): qty 1

## 2011-01-01 MED ORDER — POTASSIUM CHLORIDE CRYS ER 20 MEQ PO TBCR
20.0000 meq | EXTENDED_RELEASE_TABLET | Freq: Every day | ORAL | Status: DC
  Administered 2011-01-03 – 2011-01-12 (×10): 20 meq via ORAL
  Filled 2011-01-01 (×14): qty 1

## 2011-01-01 MED ORDER — GABAPENTIN 100 MG PO CAPS
200.0000 mg | ORAL_CAPSULE | Freq: Three times a day (TID) | ORAL | Status: DC
  Administered 2011-01-03 – 2011-01-12 (×30): 200 mg via ORAL
  Filled 2011-01-01 (×37): qty 2

## 2011-01-01 MED ORDER — MIDODRINE HCL 5 MG PO TABS
2.5000 mg | ORAL_TABLET | ORAL | Status: DC
  Administered 2011-01-03 – 2011-01-04 (×3): 2.5 mg via ORAL
  Filled 2011-01-01 (×10): qty 0.5

## 2011-01-01 MED ORDER — ROSUVASTATIN CALCIUM 20 MG PO TABS
20.0000 mg | ORAL_TABLET | Freq: Every day | ORAL | Status: DC
  Administered 2011-01-03 – 2011-01-11 (×9): 20 mg via ORAL
  Filled 2011-01-01 (×13): qty 1

## 2011-01-01 MED ORDER — OXYCODONE HCL 5 MG PO TABS
10.0000 mg | ORAL_TABLET | ORAL | Status: DC
  Administered 2011-01-03 – 2011-01-12 (×20): 10 mg via ORAL
  Filled 2011-01-01 (×6): qty 2
  Filled 2011-01-01 (×3): qty 1
  Filled 2011-01-01 (×2): qty 2
  Filled 2011-01-01: qty 1

## 2011-01-01 MED ORDER — BISACODYL 10 MG RE SUPP: 10.0000 mg | Freq: Every day | RECTAL | Status: DC | PRN

## 2011-01-01 MED ORDER — ALUM & MAG HYDROXIDE-SIMETH 200-200-20 MG/5ML PO SUSP: 30.0000 mL | ORAL | Status: DC | PRN

## 2011-01-01 MED ORDER — PROMETHAZINE HCL 12.5 MG PO TABS
12.5000 mg | ORAL_TABLET | Freq: Four times a day (QID) | ORAL | Status: DC | PRN
  Administered 2011-01-08: 12.5 mg via ORAL

## 2011-01-01 MED ORDER — POLYSACCHARIDE IRON 150 MG PO CAPS
150.0000 mg | ORAL_CAPSULE | Freq: Two times a day (BID) | ORAL | Status: DC
  Administered 2011-01-03 – 2011-01-12 (×20): 150 mg via ORAL
  Filled 2011-01-01 (×28): qty 1

## 2011-01-01 MED ORDER — BENZONATATE 100 MG PO CAPS
100.0000 mg | ORAL_CAPSULE | Freq: Four times a day (QID) | ORAL | Status: DC | PRN
  Filled 2011-01-01: qty 2

## 2011-01-01 MED ORDER — PANTOPRAZOLE SODIUM 40 MG PO TBEC
40.0000 mg | DELAYED_RELEASE_TABLET | ORAL | Status: DC
  Administered 2011-01-03 – 2011-01-12 (×10): 40 mg via ORAL

## 2011-01-01 MED ORDER — MAGNESIUM OXIDE 400 MG PO TABS
400.0000 mg | ORAL_TABLET | Freq: Two times a day (BID) | ORAL | Status: DC
  Administered 2011-01-03 – 2011-01-12 (×20): 400 mg via ORAL
  Filled 2011-01-01 (×26): qty 1

## 2011-01-01 MED ORDER — VITAMIN D (ERGOCALCIFEROL) 1.25 MG (50000 UNIT) PO CAPS
50000.0000 [IU] | ORAL_CAPSULE | ORAL | Status: DC
  Administered 2011-01-09: 50000 [IU] via ORAL
  Filled 2011-01-01 (×3): qty 1

## 2011-01-01 MED ORDER — DIPHENHYDRAMINE HCL 12.5 MG/5ML PO ELIX: 12.5000 mg | ORAL_SOLUTION | Freq: Four times a day (QID) | ORAL | Status: DC | PRN

## 2011-01-01 MED ORDER — SORBITOL 70 % SOLN: 30.0000 mL | Freq: Two times a day (BID) | Status: DC | PRN

## 2011-01-01 MED ORDER — DIPHENHYDRAMINE HCL 25 MG PO CAPS: 25.0000 mg | ORAL_CAPSULE | Freq: Four times a day (QID) | ORAL | Status: DC | PRN

## 2011-01-01 MED ORDER — PREDNISONE 10 MG PO TABS
10.0000 mg | ORAL_TABLET | ORAL | Status: DC
  Administered 2011-01-03 – 2011-01-04 (×2): 10 mg via ORAL
  Filled 2011-01-01 (×5): qty 1

## 2011-01-01 MED ORDER — TRAMADOL HCL 50 MG PO TABS
50.0000 mg | ORAL_TABLET | Freq: Four times a day (QID) | ORAL | Status: DC
  Administered 2011-01-03 – 2011-01-12 (×38): 50 mg via ORAL
  Filled 2011-01-01 (×34): qty 1

## 2011-01-01 MED ORDER — ALBUTEROL SULFATE (5 MG/ML) 0.5% IN NEBU
2.5000 mg | INHALATION_SOLUTION | Freq: Four times a day (QID) | RESPIRATORY_TRACT | Status: DC | PRN
  Filled 2011-01-01: qty 0.5

## 2011-01-01 MED ORDER — NEPHRO-VITE 0.8 MG PO TABS
1.0000 | ORAL_TABLET | Freq: Every day | ORAL | Status: DC
  Administered 2011-01-03 – 2011-01-06 (×4): 1 via ORAL
  Filled 2011-01-01 (×9): qty 1

## 2011-01-01 MED ORDER — ZOLPIDEM TARTRATE 5 MG PO TABS: 5.0000 mg | ORAL_TABLET | Freq: Every evening | ORAL | Status: DC | PRN

## 2011-01-02 DIAGNOSIS — E2740 Unspecified adrenocortical insufficiency: Secondary | ICD-10-CM | POA: Clinically undetermined

## 2011-01-02 DIAGNOSIS — S72141A Displaced intertrochanteric fracture of right femur, initial encounter for closed fracture: Secondary | ICD-10-CM | POA: Diagnosis present

## 2011-01-02 DIAGNOSIS — I959 Hypotension, unspecified: Secondary | ICD-10-CM | POA: Diagnosis not present

## 2011-01-02 DIAGNOSIS — I951 Orthostatic hypotension: Secondary | ICD-10-CM | POA: Diagnosis not present

## 2011-01-02 DIAGNOSIS — D62 Acute posthemorrhagic anemia: Secondary | ICD-10-CM | POA: Diagnosis not present

## 2011-01-02 LAB — CORTISOL-AM, BLOOD: Cortisol - AM: 2.2 ug/dL — ABNORMAL LOW (ref 4.3–22.4)

## 2011-01-02 MED ORDER — OXYCODONE HCL 5 MG PO TABS
5.0000 mg | ORAL_TABLET | ORAL | Status: DC | PRN
  Administered 2011-01-03: 5 mg via ORAL
  Filled 2011-01-02: qty 2
  Filled 2011-01-02 (×2): qty 1

## 2011-01-03 MED ORDER — COSYNTROPIN 0.25 MG IJ SOLR
0.2500 mg | Freq: Once | INTRAMUSCULAR | Status: DC
  Filled 2011-01-03: qty 0.25

## 2011-01-03 MED ORDER — COSYNTROPIN 0.25 MG IJ SOLR
0.2500 mg | Freq: Once | INTRAMUSCULAR | Status: AC
  Administered 2011-01-04: 0.25 mg via INTRAVENOUS

## 2011-01-03 NOTE — Progress Notes (Signed)
  Subjective:    Patient ID: [name], female    DOB: [birth year], 76 y.o.   MRN: [id number]  HPI Comments: No specific complaints she denies orthostatic sxs Filed Vitals:   01/03/11 0602  BP: 71/44  Pulse: 137  Temp:   Resp: My exam---18 (not recorded in Epic)    Review of Systems     Objective:   Physical Exam  no acute distress. HEENT exam atraumatic, normocephalic, extraocular muscles are intact. Neck is supple. No jugular venous distention no thyromegaly. Chest clear to auscultation without increased work of breathing. Cardiac exam S1 and S2 are regular. Abdominal exam active bowel sounds, soft, nontender. Extremities no edema. Neurologic exam she is alert     Assessment & Plan:   A/P:   Closed intertrochanteric fracture of right hip -- ongoing rehab Acute blood loss anemia -- stable hgb Orthostasis -- continue present meds-, she is not having any sxs, tolerating midodrine. Will continue to follow. Evaluate low cortisol levels Low serum cortisol level --- needs further evaluation:  A normal response to the high-dose (250 mcg as an IV bolus) ACTH stimulation test is a rise in serum cortisol concentration after either 30 or 60 minutes to a peak of =18 to 20 mcg/dL (914 to 782 nmol/L) [95,62-13]. A normal response to the high-dose (250 mcg) ACTH stimulation test excludes primary adrenal insufficiency and most patients with secondary adrenal insufficiency

## 2011-01-03 NOTE — Progress Notes (Signed)
Physical Therapy Session Note  Patient Details  Name: RAISA DITTO MRN: [id number] Date of Birth: [birth year]  Today's Date: 01/03/2011 Time:  - [phone]    Precautions: Precautions Precautions: Other (comment) (TDWB Left leg) Restrictions Weight Bearing Restrictions: Yes LLE Weight Bearing: Touchdown weight bearing Fall risk, orthostatic  Short Term Goals: = LTGs    Skilled Therapeutic Interventions/Progress Updates: Pt with limited tolerance of standing and positional changes during this session becoming orthostatic within ~20 seconds of standing position. Functional dynamic balance task in standing to fold towels with functional strengthening with sit to stand multiple reps. See [facility] Flowsheets for details on BP readings with RN was notified about. Pt's husband present to observe treatment session and educated regarding positioning of pt due to orthostasis. Pt with abdominal binder, ted hose, and ACE wraps on.      General Chart Reviewed: Yes Response to Previous Treatment: Patient with no complaints from previous session. Vital Signs Therapy Vitals BP: 66/42 mmHg Patient Position, if appropriate: Standing Pain Pain Assessment Pain Assessment: No/denies pain Pain Score:  (pain with mobility -premedicated) Mobility Bed Mobility Bed Mobility: Yes Supine to Sit: 5: Supervision Transfers Transfers: Yes Stand Pivot Transfers: 5: Supervision Stand Pivot Transfer Details: Verbal cues for Information systems manager Mobility: Yes Wheelchair Assistance: 5: Investment banker, operational: Both upper extremities   Exercises General Exercises - Lower Extremity Short Arc Quad: Both;10 reps;Supine (4# ankle weight on Right leg, no weight L leg AAROM) Long Arc Quad: Both;10 reps;Seated;Weights (4# on right leg, no weight on left) Straight Leg Raises: Right;10 reps (4# weight)  Other Treatments Treatments Therapeutic Activity:   (fucntional task for dynamic standing balance to fold towels)  Therapy/Group: Individual Therapy  [name] [facility] 01/03/2011, 10:53 AM

## 2011-01-04 ENCOUNTER — Ambulatory Visit
Admission: RE | Admit: 2011-01-04 | Discharge: 2011-01-04 | Disposition: A | Discharge status: Home / Self Care | Payer: Medicare Other | Source: Ambulatory Visit | Attending: Radiation Oncology | Admitting: Radiation Oncology

## 2011-01-04 DIAGNOSIS — C50919 Malignant neoplasm of unspecified site of unspecified female breast: Secondary | ICD-10-CM

## 2011-01-04 LAB — CORTISOL-AM, BLOOD: Cortisol - AM: 11.5 ug/dL (ref 4.3–22.4)

## 2011-01-04 MED ORDER — MIDODRINE HCL 5 MG PO TABS
2.5000 mg | ORAL_TABLET | ORAL | Status: DC
  Administered 2011-01-04 – 2011-01-05 (×2): 2.5 mg via ORAL
  Filled 2011-01-04 (×4): qty 0.5

## 2011-01-04 MED ORDER — PREDNISONE 5 MG PO TABS
5.0000 mg | ORAL_TABLET | ORAL | Status: DC
  Administered 2011-01-05 – 2011-01-10 (×6): 5 mg via ORAL
  Administered 2011-01-11: 10:00:00 via ORAL
  Administered 2011-01-12: 5 mg via ORAL
  Filled 2011-01-04 (×10): qty 1

## 2011-01-04 NOTE — Progress Notes (Signed)
Physical Therapy Session Note  Patient Details  Name: [name] MRN: [id number] Date of Birth: 02-16-1935  Today's Date: 01/04/2011 Time: 0900-1000 Time Calculation (min): 60 min  Precautions: Precautions Precautions: Other (comment) (touch down weight bearing throughout left lower extremity) Required Braces or Orthoses: No Restrictions Weight Bearing Restrictions: Yes LLE Weight Bearing: Touchdown weight bearing   Skilled Therapeutic Interventions/Progress Updates:    pt still presenting with orthostatic issues when in standing with limited tolerance. Focus during session on standing tolerance and functional strengthening and balance with sit to stands and dynamic balance activity. BP remained around 100/51 with symptoms inconsistant, ACE wraps were not on.  General Chart Reviewed: Yes Vital Signs Therapy Vitals BP: 100/51 mmHg Pain Pain Assessment Pain Score:   3 Pain Location: Leg Pain Orientation: Left Mobility Transfers Transfers: Yes Sit to Stand: 5: Supervision Stand to Sit: 5: Supervision Locomotion  Gait Gait:  (attempted, pt only able to take one step before orthostatic) Wheelchair Mobility Wheelchair Mobility: Yes Wheelchair Assistance: 5: Supervision Wheelchair Propulsion: Both upper extremities Distance: 150      Other Treatments Treatments Therapeutic Activity: dynamics balance and standing tolerance to play horseshoe toss and weightshifting  Therapy/Group: Individual Therapy  [name] [facility] 01/04/2011, 11:33 AM

## 2011-01-04 NOTE — Progress Notes (Signed)
[facility] Radiation Oncology Weekly Treatment Note    Name: [name] [facility] Date: 01/04/2011 MRN: [id number] DOB: May 11, 1934  Status:inpatient    Current dose: 4800cGy  Current fraction:24  Planned dose:6000cGy  Planned fraction:30   MEDICATIONS:@MEDADMINPROSE @   ALLERGIES: Penicillins; Adhesive; and Doxycycline   LABORATORY DATA:     NARRATIVE: [name] was seen today for weekly treatment management. The chart was checked and port films images were reviewed. No complaints today.Using R P Gel BID. Continues with Rehab. at [facility].  PHYSICAL EXAMINATION: vitals were not taken for this visit.        Mild erythema. No desquamation.    ASSESSMENT: Patient tolerating treatments well.    PLAN: Continue treatment as planned.

## 2011-01-04 NOTE — Progress Notes (Signed)
Per State Regulation 482.30 This chart was reviewed for medical necessity with respect to the patient's Admission/Duration of stay.  Pt participating & progressing in therapies.  Continues w/ issues of pain management & management of orthostasis. XRT to resume this week--Radiation Oncology said Tues-Fri this week & Mon. next week will be at 4:40.  On Tues 11/13 she will be scheduled for 10:00 so she can d/c from Rehab that afternoon.  Team conf is tomorrow.  Brock Ra                 Nurse Care Manager              Next Review Date: 11/9

## 2011-01-04 NOTE — Progress Notes (Signed)
Patient is alert and oriented x3. She continues to c/o pain in her left hip and received pain meds x4 which provided temporary relief. Patient is continent of both bowel and bladder with some urine frequency. Patient went for radiation therapy this afternoon and tolerated it well. Continue current plan of care.

## 2011-01-04 NOTE — Progress Notes (Signed)
Occupational Therapy Session Note  Patient Details  Name: [name] MRN: [id number] Date of Birth: 01-16-1935  Today's Date: 01/04/2011 Time:  - 0800-0900    Precautions: Precautions Precautions: Other (comment) (touch down weight bearing throughout left lower extremity) Required Braces or Orthoses: No Restrictions Weight Bearing Restrictions: Yes LLE Weight Bearing: Touchdown weight bearing   Skilled Therapeutic Interventions/Progress Updates:   Skilled OT for focus on ADL retraining at shower level with emphasis on blood pressure regulation, increasing activity tolerance, dynamic standing balance/tolerance, UB/LB bathing and dressing in sit-stand position, grooming tasks seated in w/c at sink, stand-pivot transfers to w/c to shower seat to elevated toilet seat. Patient with complaints of "dizziness" during fast movements and after standing; nursing and doctor aware. Discussed medication management with nursing before therapies in order to increase overall patients independence; for regulation of blood pressure.   General General Chart Reviewed: Yes Family/Caregiver Present: No  Pain None  ADL ADL Equipment Provided: Long-handled sponge Eating: Independent Where Assessed-Eating: Edge of bed Grooming: Supervision/safety Where Assessed-Grooming: Sitting at sink Upper Body Bathing: Supervision/safety Where Assessed-Upper Body Bathing: Shower Lower Body Bathing: Supervision/safety Where Assessed-Lower Body Bathing: Shower Upper Body Dressing: Supervision/safety Where Assessed-Upper Body Dressing: Wheelchair Lower Body Dressing: Supervision/safety Where Assessed-Lower Body Dressing: Wheelchair (sit-stand postion) Toileting: Supervision/safety Where Assessed-Toileting: Teacher, adult education: Camera operator Method: Surveyor, minerals: Other (comment) (elevated toilet seat) Tub/Shower Transfer: Close  supervison Web designer Method: Stand pivot Tub/Shower Equipment: Shower seat with back   Therapy/Group: Individual Therapy  Tarell Schollmeyer 01/04/2011, 9:04 AM

## 2011-01-04 NOTE — Progress Notes (Signed)
Occupational Therapy Session Note  Patient Details  Name: [name] MRN: [id number] Date of Birth: 06-Feb-1935  Today's Date: 01/04/2011 Time: 1330-1410 Time Calculation (min): 40 min  Precautions: Precautions Precautions: Other (comment) (touch down weight bearing throughout left lower extremity) Required Braces or Orthoses: No Restrictions Weight Bearing Restrictions: Yes LLE Weight Bearing: Touchdown weight bearing   Skilled Therapeutic Interventions/Progress Updates:    Treatment focus on bed mobility, sit-stands, stand-sits. Blood pressure checked in supine=116/56, sitting=94/53, and standing=87/48. Therapeutic activity in standing focusing on dynamic standing balance/tolerance/endurance and increasing overall activity tolerance. After activity blood pressure in standing=86/48; patient with no symptoms of low blood pressure. During past sessions as soon as patient stood, she had complaints of dizziness.   General General Chart Reviewed: Yes Response to Previous Treatment: Patient with no complaints from previous session Family/Caregiver Present: No  Vital Signs See blood pressure readings in supine, sitting, and standing above under Skilled Therapeutic Interventions/Progress Updates.  Pain None  Therapy/Group: Individual Therapy  [name] 01/04/2011, 2:17 PM

## 2011-01-04 NOTE — Progress Notes (Signed)
  Subjective/Complaints: persistent left wrist pain.  Blood pressures were low again yesterday  Objective: Vital Signs: Blood pressure 79/50, pulse 95, temperature 98.5 F (36.9 C), temperature source Oral, resp. rate 15, height 5\' 7"  (1.702 m), weight 64.43 kg (142 lb 0.7 oz), SpO2 94.00%. No results found. No results found for this basename: WBC:2,HGB:2,HCT:2,PLT:2 in the last 72 hours No results found for this basename: NA:2,K:2,CL:2,CO2:2,GLUCOSE:2,BUN:2,CREATININE:2,CALCIUM:2 in the last 72 hours  Physical Exam: General appearance: alert Head: Normocephalic, without obvious abnormality, atraumatic Eyes: conjunctivae/corneas clear. PERRL, EOM's intact. Fundi benign. Ears: normal TM's and external ear canals both ears Nose: Nares normal. Septum midline. Mucosa normal. No drainage or sinus tenderness. Throat: lips, mucosa, and tongue normal; teeth and gums normal Neck: no adenopathy, no carotid bruit, no JVD, supple, symmetrical, trachea midline and thyroid not enlarged, symmetric, no tenderness/mass/nodules Back: symmetric, no curvature. ROM normal. No CVA tenderness. Resp: clear to auscultation bilaterally Cardio: regular rate and rhythm, S1, S2 normal, no murmur, click, rub or gallop GI: soft, non-tender; bowel sounds normal; no masses,  no organomegaly Extremities: extremities normal, atraumatic, no cyanosis or edema Pulses: 2+ and symmetric Skin: Skin color, texture, turgor normal. No rashes or lesions Neurologic: Grossly normal Incision/Wound:  Right hip wound clean and intact   Assessment/Plan: 1. Functional deficits secondary to right intertrochanteric hip fracture which require 3+ hours per day of interdisciplinary therapy in a comprehensive inpatient rehab setting. Physiatrist is providing close team supervision and 24 hour management of active medical problems listed below. Physiatrist and rehab team continue to assess barriers to discharge/monitor patient progress toward  functional and medical goals. Mobility: Bed Mobility Bed Mobility: Yes Supine to Sit: 5: Supervision Transfers Transfers: Yes Stand Pivot Transfers: 5: Public librarian Mobility: Yes Wheelchair Assistance: 5: Supervision Wheelchair Propulsion: Both upper extremities ADL:   Cognition:      2. Anticoagulation/DVT prophylaxis with Pharmaceutical: Lovenox  3. Pain Management: prn and scheduled oxycodone, tylenol, robaxin, ultram. May need to consider a long acting agent.  Patient Active Hospital Problem List: Closed intertrochanteric fracture of right hip (01/02/2011)    POA: Yes   Assessment:    Plan: continue rehab Acute blood loss anemia (01/02/2011)    POA: No   Assessment:  Dietary supplementation.  Hgb trending up   Plan:  Orthostasis (01/02/2011)    POA: No   Assessment: persistent   Plan: midodrine may need to be increased.  Will change timing to coincide better with activy.  TEDS/ Abd binder Low serum cortisol level (01/02/2011)    POA: Clinically Undetermined   Assessment: cortrosyn challenge with results pendin   Plan: test pending.  Reduce scheduled prednisone to 5mg .   10  SWARTZ,ZACHARY T 01/04/2011, 8:45 AM

## 2011-01-04 NOTE — Progress Notes (Signed)
Physical Therapy Session Note  Patient Details  Name: [name] MRN: [id number] Date of Birth: [birth year]  Today's Date: 01/04/2011 Time: 1130-1200 Time Calculation (min): 30 min  Precautions: Precautions Precautions: Other (comment) (touch down weight bearing throughout left lower extremity) Required Braces or Orthoses: No Restrictions Weight Bearing Restrictions: Yes LLE Weight Bearing: Touchdown weight bearing   Skilled Therapeutic Interventions/Progress Updates:    Pt tolerated this treatment session better than this AM in regards to orthostasis. Pt with ACE wraps, ted hose, and abdomindal binder. Focus on gait training for functional mobility and simulated car transfers this session.   General Chart Reviewed: Yes Vital Signs Therapy Vitals BP: 106/71 mmHg (standing 126/75) Patient Position, if appropriate: Sitting (legs elevated) Pain Pain Assessment Pain Score:   5 Pain Location: Leg Pain Orientation: Left Mobility Transfers Transfers: Yes Sit to Stand: 5: Supervision Stand to Sit: 5: Supervision Stand Pivot Transfers: 5: Supervision Locomotion  Ambulation Ambulation: Yes Ambulation/Gait Assistance: 4: Min assist Ambulation Distance (Feet): 25 Feet (repeated x 15'. Cueing for WB status and posture) Assistive device: Rolling walker Ambulation/Gait Assistance Details: Verbal cues for technique;Visual cues for safe use of DME/AE Gait Gait:  (attempted, pt only able to take one step before orthostatic) Wheelchair Mobility Wheelchair Mobility: Yes Wheelchair Assistance: 5: Supervision (limited secondary to pain in L wrist) Wheelchair Propulsion: Both upper extremities Distance: 150       Other Treatments Treatments Therapeutic Activity: simulated car transfer squat pivot technique, overall minA. Cues for safety and technique needed. Husband present to observe.  Therapy/Group: Individual Therapy  [name] [facility] 01/04/2011, 12:20 PM

## 2011-01-05 ENCOUNTER — Ambulatory Visit
Admission: RE | Admit: 2011-01-05 | Discharge: 2011-01-05 | Disposition: A | Discharge status: Home / Self Care | Payer: Medicare Other | Source: Ambulatory Visit | Attending: Radiation Oncology | Admitting: Radiation Oncology

## 2011-01-05 DIAGNOSIS — C50919 Malignant neoplasm of unspecified site of unspecified female breast: Secondary | ICD-10-CM

## 2011-01-05 DIAGNOSIS — S72143A Displaced intertrochanteric fracture of unspecified femur, initial encounter for closed fracture: Secondary | ICD-10-CM

## 2011-01-05 DIAGNOSIS — I951 Orthostatic hypotension: Secondary | ICD-10-CM

## 2011-01-05 LAB — CBC
HCT: 33 % — ABNORMAL LOW (ref 36.0–46.0)
Hemoglobin: 10.5 g/dL — ABNORMAL LOW (ref 12.0–15.0)
MCV: 95.7 fL (ref 78.0–100.0)
RBC: 3.45 MIL/uL — ABNORMAL LOW (ref 3.87–5.11)
WBC: 6 10*3/uL (ref 4.0–10.5)

## 2011-01-05 LAB — COMPREHENSIVE METABOLIC PANEL
Albumin: 2.6 g/dL — ABNORMAL LOW (ref 3.5–5.2)
Alkaline Phosphatase: 144 U/L — ABNORMAL HIGH (ref 39–117)
BUN: 18 mg/dL (ref 6–23)
CO2: 31 mEq/L (ref 19–32)
Chloride: 99 mEq/L (ref 96–112)
Creatinine, Ser: 0.75 mg/dL (ref 0.50–1.10)
GFR calc Af Amer: >90 mL/min (ref >90)
GFR calc non Af Amer: 81 mL/min — ABNORMAL LOW (ref >90)
Glucose, Bld: 94 mg/dL (ref 70–99)
Potassium: 4.2 mEq/L (ref 3.5–5.1)
Total Bilirubin: 0.4 mg/dL (ref 0.3–1.2)

## 2011-01-05 MED ORDER — MIDODRINE HCL 5 MG PO TABS
5.0000 mg | ORAL_TABLET | ORAL | Status: DC
  Administered 2011-01-05 – 2011-01-12 (×15): 5 mg via ORAL
  Filled 2011-01-05 (×16): qty 1

## 2011-01-05 NOTE — Patient Care Conference (Signed)
Inpatient RehabilitationTeam Conference Note Date: 01/05/2011   Time: 2:11 PM    Patient Name: [name] [facility]      Medical Record Number: [id number]  Date of Birth: 01-Apr-1934 Sex: Female         Room/Bed: 4030/4030-01 Payor Info: Payor: MEDICARE  Plan: MEDICARE PART A AND B  Product Type: *No Product type*     Admitting Diagnosis: (L)HIP FX  Admit Date/Time:  12/25/2010  3:01 PM Admission Comments: No comment available    Patient Active Problem List  Diagnoses Date Noted  . Closed intertrochanteric fracture of right hip 01/02/2011  . Acute blood loss anemia 01/02/2011  . Orthostasis 01/02/2011  . Low serum cortisol level 01/02/2011  . Laceration of leg, left 10/21/2010  . Breast cancer, stage 2 Right 09/07/2010  . Cough 03/19/2010  . HYPERLIPIDEMIA 01/23/2010  . OBSTRUCTIVE SLEEP APNEA 01/23/2010  . ASTHMA 01/23/2010  . MASS, LUNG 01/23/2010    Team Members Present: Physician: Dr. Faith Rogue Case Manager Present: Melanee Spry, RN Social Worker Present: Amada Jupiter, LCSW PT Present: [name], Judith Blonder, PTA OT Present: Edwin Cap, Loistine Chance, OT Other (Discipline and Name): Tora Duck, RN     Current Status/Progress Goal Weekly Team Focus  Medical   intertrochanteric fx with improved paion contorl.  patient has resumed xrt.  orthostasis is still an issue  stabilize bp  activity tolerance from a pain and cardiovascular standpoint   Bowel/Bladder   Continent bowel and bladder         Swallow/Nutrition/ Hydration             ADL's   Patient at overall S level for ADLs at w/c level secondary to orthostasis  Overall S level at ambulating/sit-stand level  Blood pressure regulation, increasing overall activity tolerance, sit-stands, standing tolerance/balance   Mobility   close supervision/min A overall, limited secondary to orthostasis. mostly w/c level  supervision overall, mod i w/c mobility, supervision short gait distances  standing/gait tolerance  secondary to orthostasis, family ed   Communication             Safety/Cognition/ Behavioral Observations            Pain   Managed with scheduled Oxy. IR and Ultram.  Managed 2/10 at D/c  Continue meds as ordered;     Skin   ecchymosis, scattered ext's; fragile  No breakdown, no S/S infection  Monitor, reposition      *See Interdisciplinary Assessment and Plan and progress notes for long and short-term goals  Barriers to Discharge: activity tolerance as above    Possible Resolutions to Barriers:  pacing, teds, binders, fluids, patient and caregiver ed    Discharge Planning/Teaching Needs:  Home with husband who will provide 24 hour per day assistance.  Likely to have HH services to start  Husband here daily and attending therapies - education ongoing and no issues   Team Discussion: Still w/ some dizziness in morning.  Basically doing well.  XRT resumed.  On target for goals.  Revisions to Treatment Plan: none   Continued Need for Acute Rehabilitation Level of Care: The patient requires daily medical management by a physician with specialized training in physical medicine and rehabilitation for the following conditions: Daily direction of a multidisciplinary physical rehabilitation program to ensure safe treatment while eliciting the highest outcome that is of practical value to the patient.: Yes Daily medical management of patient stability for increased activity during participation in an intensive rehabilitation regime.: Yes Daily analysis  of laboratory values and/or radiology reports with any subsequent need for medication adjustment of medical intervention for : Cardiac problems;Post surgical problems  Brock Ra 01/05/2011, 2:11 PM

## 2011-01-05 NOTE — Progress Notes (Signed)
   Subjective/Complaints: persistent left wrist pain.  Blood pressures were low again yesterday Review of Systems  Constitutional: Negative.   Respiratory: Negative.   Cardiovascular: Negative.   Musculoskeletal: Positive for joint pain.  Skin: Negative.    Objective: Vital Signs: Blood pressure 79/50, pulse 95, temperature 98.5 F (36.9 C), temperature source Oral, resp. rate 15, height 5\' 7"  (1.702 m), weight 64.43 kg (142 lb 0.7 oz), SpO2 94.00%. No results found. No results found for this basename: WBC:2,HGB:2,HCT:2,PLT:2 in the last 72 hours No results found for this basename: NA:2,K:2,CL:2,CO2:2,GLUCOSE:2,BUN:2,CREATININE:2,CALCIUM:2 in the last 72 hours  Physical Exam: General appearance: alert Head: Normocephalic, without obvious abnormality, atraumatic Eyes: conjunctivae/corneas clear. PERRL, EOM's intact. Fundi benign. Ears: normal TM's and external ear canals both ears Nose: Nares normal. Septum midline. Mucosa normal. No drainage or sinus tenderness. Throat: lips, mucosa, and tongue normal; teeth and gums normal Neck: no adenopathy, no carotid bruit, no JVD, supple, symmetrical, trachea midline and thyroid not enlarged, symmetric, no tenderness/mass/nodules Back: symmetric, no curvature. ROM normal. No CVA tenderness. Resp: clear to auscultation bilaterally Cardio: regular rate and rhythm, S1, S2 normal, no murmur, click, rub or gallop GI: soft, non-tender; bowel sounds normal; no masses,  no organomegaly Extremities: extremities normal, atraumatic, no cyanosis or edema Pulses: 2+ and symmetric Skin: Skin color, texture, turgor normal. No rashes or lesions Neurologic: Grossly normal Incision/Wound:  Right hip wound clean and intact   Assessment/Plan: 1. Functional deficits secondary to right intertrochanteric hip fracture which require 3+ hours per day of interdisciplinary therapy in a comprehensive inpatient rehab setting. Physiatrist is providing close team  supervision and 24 hour management of active medical problems listed below. Physiatrist and rehab team continue to assess barriers to discharge/monitor patient progress toward functional and medical goals. Mobility: Bed Mobility Bed Mobility: Yes Supine to Sit: 5: Supervision Transfers Transfers: Yes Stand Pivot Transfers: 5: Public librarian Mobility: Yes Wheelchair Assistance: 5: Supervision Wheelchair Propulsion: Both upper extremities ADL:   Cognition:      2. Anticoagulation/DVT prophylaxis with Pharmaceutical: Lovenox/ TEDS  3. Pain Management: prn and scheduled oxycodone, tylenol, robaxin, ultram. May need to consider a long acting agent.  Patient Active Hospital Problem List: Closed intertrochanteric fracture of right hip (01/02/2011)    POA: Yes   Assessment:    Plan: continue rehab. Team conf today. Acute blood loss anemia (01/02/2011)    POA: No   Assessment:  Dietary supplementation.  Hgb trending up   Plan:  Orthostasis (01/02/2011)    POA: No   Assessment: persistent   Plan: .  Increase midodrine. Timing of med changed.  TEDS/ Abd binder. Labs pending today Low serum cortisol level   Plan:   Reduce scheduled prednisone to 5mg . Am cortisol level nl yesterday   10  [name] T 01/04/2011, 8:45 AM

## 2011-01-05 NOTE — Progress Notes (Signed)
Occupational Therapy Weekly Progress Note  Patient Details  Name: [name] MRN: [id number] Date of Birth: 1935-02-14 Today's Date: 01/05/2011  Short term goals=Long term goals  Patient is making good progress toward set OT long term goals. Fluctuating blood pressure has been a barrier to overall independence pertaining to functional tasks and ADLs. Patient is currently functioning at an overall supervision level at w/c level for aspects of daily living. However, patient continues to require min assist for LB dressing to donn left sock.  Goals are set for patient to be supervision in ambulating/standing positions. Therapist continues to work with patient in standing position for dynamic standing activities, increase overall functional mobility, and increase overall independence with aspects of daily living. Blood pressures have been taken and recorded in various positions; supine, sitting, and standing.   Husband has been present for some afternoon sessions with verbal explanation and demonstration concerning close supervision and handling of patient. Husband, who will be main caregiver at home, has been checked off to assist/supervise patient to bathroom while patient performs stand pivot transfers from w/c to elevated toilet seat.     Patient continues to demonstrate the following deficits: decreased activity tolerance, decreased independence with functional ADLs in ambulating & standing positions, decreased dynamic standing balance/tolerance/endurance and therefore will continue to benefit from skilled OT intervention to enhance overall performance with ADL and iADL.  Patient progressing toward long term goals..  Continue plan of care.  OT Short Term Goals=Long Term Goals    [name] 01/05/2011, 1:34 PM

## 2011-01-05 NOTE — Progress Notes (Signed)
Physical Therapy Session Note  Patient Details  Name: [name] MRN: [id number] Date of Birth: Jun 16, 1934  Today's Date: 01/05/2011 Time: 1110-1155 Time Calculation (min): 45 min  Precautions: Precautions Precautions: Other (comment) (touch down weight bearing throughout left lower extremity) Required Braces or Orthoses: No Restrictions Weight Bearing Restrictions: Yes LLE Weight Bearing: Touchdown weight bearing  Short Term Goals: =LTG    Skilled Therapeutic Interventions/Progress Updates: Pt limited with therapy due to left wrist pain (splint is on), left leg pain, and orthostasis. Focus on gait training with platform walker (left) to assist with decreased weightbearing through the wrist, requiring cueing for technique and advancing AD. Therex for lower extremity strengthening. W/C mobility limited to 15' secondary to pt's c/o left wrist pain and unable to propel chair any further. Reminded pt that she needs to be able to maneuver w/c for home environment but pt states her husband can push her around.      General Chart Reviewed: Yes Vital Signs Therapy Vitals BP: 85/55 mmHg (seated 99/66) Patient Position, if appropriate: Standing Pain Pain Assessment Pain Assessment: No/denies pain Pain Score:  (pain increased with mobility in left leg) Pain Location: Leg Pain Orientation: Left Mobility Bed Mobility Bed Mobility: Yes Right Sidelying to Sit: 5: Supervision Supine to Sit: 5: Supervision Transfers Transfers: Yes Sit to Stand: 5: Supervision Stand to Sit: 5: Supervision Stand Pivot Transfers: 5: Supervision Squat Pivot Transfers: 5: Supervision Locomotion  Ambulation Ambulation: Yes Ambulation/Gait Assistance: 4: Min assist Ambulation Distance (Feet): 15 Feet (repeated x 10' with minA, pain in left leg limiting) Assistive device: Left platform walker Ambulation/Gait Assistance Details: Verbal cues for technique;Verbal cues for safe use of DME/AE Wheelchair  Mobility Wheelchair Mobility: Yes Wheelchair Assistance: 5: Supervision Wheelchair Propulsion: Both upper extremities (left wrist painful limting distance) Distance: 15       Balance Dynamic Standing Balance Dynamic Standing - Level of Assistance: 5: Stand by assistance for upper extremity task    Exercises  General Exercises - Lower Extremity Ankle Circles/Pumps: 15 reps;Strengthening Short Arc Quad: 10 reps (right leg 3#) Long Arc Quad: Strengthening;15 reps (3# on right leg) Heel Slides: 10 reps (3# right leg)    Therapy/Group: Individual Therapy  [name] [facility] 01/05/2011, 12:07 PM

## 2011-01-05 NOTE — Progress Notes (Signed)
Physical Therapy Session Note  Patient Details  Name: [name] MRN: [id number] Date of Birth: [birth year]  Today's Date: 01/05/2011 Time: 0900-1000 Time Calculation (min): 60 min  Precautions: Precautions Precautions: Other (comment) (touch down weight bearing throughout left lower extremity) Required Braces or Orthoses: No Restrictions Weight Bearing Restrictions: Yes LLE Weight Bearing: Touchdown weight bearing     Skilled Therapeutic Interventions/Progress Updates: Pt's treatment session limited due to left wrist pain and orthostatic symptoms (see BPs below). Treatment session emphasis on general activity tolerance and strengthening through general lower extremity exercise, functional sit to stands, and transfers from various surfaces. Pt overall supervision level, though standing tolerance still limited secondary to orthostasis. W/c mobility limited due to left wrist pain this AM. Ice applied to left leg following treatment session.     General Chart Reviewed: Yes Vital Signs Seated BP = 106/71 left arm Standing BP = 80/53 left arm Standing BP = 87/47 left arm  BP: 87/47 mmHg Patient Position, if appropriate: Standing  Pain Pain Score:   3 Pain Location: Leg Pain Orientation: Left Mobility Transfers Transfers: Yes Sit to Stand: 5: Supervision Stand to Sit: 5: Supervision Stand Pivot Transfers: 5: Supervision Squat Pivot Transfers: 5: Supervision Locomotion    pt unable to maintain standing for gait this AM.     Balance Dynamic Standing Balance Dynamic Standing - Level of Assistance: 5: Stand by assistance Dynamic Standing - Balance Activities: Other (comment) (functional tasks during ADL)   Exercises Cardiovascular Exercises NuStep: level 3 x 10 min (for LE strengthening and general activity tolerance) General Exercises - Lower Extremity Ankle Circles/Pumps: 15 reps;Strengthening Long Arc Quad: Strengthening;15 reps (3# on right leg) Toe Raises: 15  reps (seated) Heel Raises: 15 reps (seated)     Therapy/Group: Individual Therapy  [name] [facility] 01/05/2011, 10:05 AM

## 2011-01-05 NOTE — Progress Notes (Signed)
Orthopedic Tech Progress Note Patient Details:  [name] [facility] 1934-03-14 [id number]   left wrist splint   Cammer, Mickie Bail 01/05/2011, 10:13 AM

## 2011-01-05 NOTE — Progress Notes (Signed)
Pt. Continent bowel and bladder; LBM this morning; Independent with hygiene, clothing adjustment; decreased endurance. reports dizziness upon sitting, standing; resolves on own with abdominal binder; extended time sit to stand; Pain controlled with scheduled meds; occasional PRN dose.  Appetite fair; Transport to radiation at [facility].[name]

## 2011-01-05 NOTE — Progress Notes (Signed)
Occupational Therapy Session Note  Patient Details  Name: [name] MRN: [id number] Date of Birth: [birth year]  Today's Date: 01/05/2011 Time: 0800-0900 Time Calculation (min): 60 min  Precautions: Precautions Weight Bearing Restrictions: Yes LLE Weight Bearing: Touchdown weight bearing  Short Term Goals:  STG=LTG  Skilled Therapeutic Interventions/Progress Updates:    Skilled OT for emphasis on ADL retraining at shower level. Focused on bed mobility without rails and with HOB down, dynamic standing balance/tolerance during functional tasks, use of long handled sponge to increase independence with bathing, increasing overall activity tolerance. Blood pressure=67/37 in standing and increased to 93/54 immediately after in sitting; patient with no symptoms.   General General Chart Reviewed: Yes Family/Caregiver Present: No  Pain Pain Assessment Pain Assessment: No/denies pain  ADL Equipment Provided: Long-handled sponge Eating: Independent Where Assessed-Eating: Edge of bed Grooming: Supervision/safety Where Assessed-Grooming: Sitting at sink Upper Body Bathing: Supervision/safety Where Assessed-Upper Body Bathing: Shower Lower Body Bathing: Supervision/safety Where Assessed-Lower Body Bathing: Shower Upper Body Dressing: Supervision/safety Where Assessed-Upper Body Dressing: Wheelchair Lower Body Dressing: Min assist; patient required assistance to donn left sock Where Assessed-Lower Body Dressing: Wheelchair (sit-stand postion) Toileting: Supervision/safety Where Assessed-Toileting: Teacher, adult education: Camera operator Method: Surveyor, minerals: Other (comment) (elevated toilet seat) Tub/Shower Transfer: Close supervison Web designer Method: Stand pivot Tub/Shower Equipment: Shower seat with back  Therapy/Group: Individual Therapy  Frankye Schwegel 01/05/2011, 9:15 AM

## 2011-01-05 NOTE — Progress Notes (Signed)
Occupational Therapy Session Note  Patient Details  Name: RYE DORADO MRN: [id number] Date of Birth: 11-Nov-1934  Today's Date: 01/05/2011 Time: 0102-7253 Time Calculation (min): 35 min  Precautions: Precautions Weight Bearing Restrictions: Yes LLE Weight Bearing: Touchdown weight bearing  Skilled Therapeutic Interventions/Progress Updates:    Husband present during session. Skilled OT for focus on tub bench transfer in ADL apartment in tub/shower (simulated). Also discussed & educated patient and husband on safe transfers including safety with orthostasis during standing and fast movements.   Pain Pain Assessment Pain Assessment: No/denies pain  Therapy/Group: Individual Therapy  Amos Gaber 01/05/2011, 3:03 PM

## 2011-01-05 NOTE — Progress Notes (Addendum)
Patient may leave to vote this afternoon past radiation therapy. Patient will travel by EMT and can vote if they come to ambulance.   Timeframe 4 pm to 6 pm.

## 2011-01-06 ENCOUNTER — Ambulatory Visit
Admission: RE | Admit: 2011-01-06 | Discharge: 2011-01-06 | Disposition: A | Discharge status: Home / Self Care | Payer: Medicare Other | Source: Ambulatory Visit | Attending: Radiation Oncology | Admitting: Radiation Oncology

## 2011-01-06 DIAGNOSIS — C50919 Malignant neoplasm of unspecified site of unspecified female breast: Secondary | ICD-10-CM

## 2011-01-06 DIAGNOSIS — S72143A Displaced intertrochanteric fracture of unspecified femur, initial encounter for closed fracture: Secondary | ICD-10-CM

## 2011-01-06 DIAGNOSIS — I951 Orthostatic hypotension: Secondary | ICD-10-CM

## 2011-01-06 NOTE — Progress Notes (Signed)
    Subjective/Complaints: persistent left wrist pain.  Blood pressures were low again yesterday but she seems to be tolerating activity better. Review of Systems  Constitutional: Negative.   Respiratory: Negative.   Cardiovascular: Negative.   Musculoskeletal: Positive for joint pain.  Skin: Negative.    Objective: Vital Signs: Blood pressure 79/50, pulse 95, temperature 98.5 F (36.9 C), temperature source Oral, resp. rate 15, height 5\' 7"  (1.702 m), weight 64.43 kg (142 lb 0.7 oz), SpO2 94.00%. No results found. No results found for this basename: WBC:2,HGB:2,HCT:2,PLT:2 in the last 72 hours No results found for this basename: NA:2,K:2,CL:2,CO2:2,GLUCOSE:2,BUN:2,CREATININE:2,CALCIUM:2 in the last 72 hours  Physical Exam: General appearance: alert Head: Normocephalic, without obvious abnormality, atraumatic Eyes: conjunctivae/corneas clear. PERRL, EOM's intact. Fundi benign. Ears: normal TM's and external ear canals both ears Nose: Nares normal. Septum midline. Mucosa normal. No drainage or sinus tenderness. Throat: lips, mucosa, and tongue normal; teeth and gums normal Neck: no adenopathy, no carotid bruit, no JVD, supple, symmetrical, trachea midline and thyroid not enlarged, symmetric, no tenderness/mass/nodules Back: symmetric, no curvature. ROM normal. No CVA tenderness. Resp: clear to auscultation bilaterally Cardio: regular rate and rhythm, S1, S2 normal, no murmur, click, rub or gallop GI: soft, non-tender; bowel sounds normal; no masses,  no organomegaly Extremities: extremities normal, atraumatic, no cyanosis or edema Pulses: 2+ and symmetric Skin: Skin color, texture, turgor normal. No rashes or lesions Neurologic: Grossly normal Incision/Wound:  left hip wound clean and intact   Assessment/Plan: 1. Functional deficits secondary to left intertrochanteric hip fracture which require 3+ hours per day of interdisciplinary therapy in a comprehensive inpatient rehab  setting. Physiatrist is providing close team supervision and 24 hour management of active medical problems listed below. Physiatrist and rehab team continue to assess barriers to discharge/monitor patient progress toward functional and medical goals. Mobility: Bed Mobility Bed Mobility: Yes Supine to Sit: 5: Supervision Transfers Transfers: Yes Stand Pivot Transfers: 5: Public librarian Mobility: Yes Wheelchair Assistance: 5: Supervision Wheelchair Propulsion: Both upper extremities ADL:   Cognition:      2. Anticoagulation/DVT prophylaxis with Pharmaceutical: Lovenox/ TEDS  3. Pain Management: prn and scheduled oxycodone, tylenol, robaxin, ultram. May need to consider a long acting agent.  Patient Active Hospital Problem List: Closed intertrochanteric fracture of right hip (01/02/2011)    POA: Yes   Assessment:    Plan: continue rehab. Team conf today. Wound clean Acute blood loss anemia (01/02/2011)    POA: No   Assessment:  Dietary supplementation.  Hgb trending up   Plan:  Orthostasis (01/02/2011)    POA: No   Assessment: persistent   Plan: .  Increased midodrine. Timing of med changed.  TEDS/ Abd binder. Needs to take additional time in the am before rising. Labs are stable. Low serum cortisol level   Plan:   Reduce scheduled prednisone to 5mg . Am cortisol level nl yesterday   10  [name] 01/04/2011, 8:45 AM

## 2011-01-06 NOTE — Progress Notes (Signed)
Physical Therapy Session Note  Patient Details  Name: [name] MRN: [id number] Date of Birth: December 02, 1934  Today's Date: 01/06/2011 Time: 1005-1100 Time Calculation (min): 55 min  Precautions: Precautions Precautions: Other (comment) (touch down weight bearing throughout left lower extremity) Required Braces or Orthoses: No Restrictions Weight Bearing Restrictions: Yes LLE Weight Bearing: Touchdown weight bearing  Short Term Goals: = LTGs    Skilled Therapeutic Interventions/Progress Updates: Treatment session emphasis on gait with platform RW for strengthening, dynamic gait through obstacles, general activity tolerance, and safety with AD. Pt tolerated treatment better today regarding orthostasis, but towards end of session pt unable to complete standing activity due to BP drop with lightheaded and "shaky feeling" complaints by pt. RN aware. Pt declined w/c propulsion secondary to wrist pain on L wrist (splint on).      General Chart Reviewed: Yes Vital Signs Therapy Vitals BP: 97/60 mmHg (immediately after standing) Patient Position, if appropriate: Sitting See other BP values in Doc Flowsheets for 1000-1100 hour during therapy sesion. Pain Pain Assessment Pain Score:   3 Pain Type: Other (Comment) (Premed) Mobility Transfers: Yes Sit to Stand: 5: Supervision Stand to Sit: 5: Supervision Stand to Sit Details (indicate cue type and reason): Verbal cues for precautions/safety (to reach back for chair) Stand Pivot Transfers: 5: Supervision Locomotion  Ambulation Ambulation: Yes Ambulation/Gait Assistance: 4: Min assist Ambulation Distance (Feet): 25 Feet (x4 with obstacle negotiation) Assistive device: Left platform walker Ambulation/Gait Assistance Details: Verbal cues for sequencing;Verbal cues for safe use of DME/AE High Level Ambulation High Level Ambulation: Direction changes (negotiating obstacles) Direction Changes: negotiating obstacles and cues for  sequence with walker Wheelchair Mobility Wheelchair Mobility: Yes Wheelchair Assistance: 1: +1 Total assist (pt declined to propel secondary to left wrist pain) Distance: 150     Balance Balance Balance Assessed: Yes Dynamic Standing Balance Dynamic Standing - Level of Assistance: 5: Stand by assistance Dynamic Standing - Balance Activities: Forward lean/weight shifting;Lateral lean/weight shifting;Reaching for objects (initiated to play horseshoes, but pt unable to tolerate) due to pt symptomatic with lightheadedness. See doc flowsheets for BP values for 1000-1100 hour.       Therapy/Group: Individual Therapy  [name] [facility] 01/06/2011, 11:01 AM

## 2011-01-06 NOTE — Progress Notes (Signed)
Occupational Therapy Session Note  Patient Details  Name: [name] MRN: [id number] Date of Birth: August 26, 1934  Today's Date: 01/06/2011 Time: 1330-1400 Time Calculation (min): 30 min  Precautions:  Restrictions Weight Bearing Restrictions: Yes LLE Weight Bearing: Touchdown weight bearing  Skilled Therapeutic Interventions/Progress Updates:    Skilled OT for emphasis on therapeutic activity in ADL apartment kitchen with focus on functional ambulation using platform rolling walker, safety in the kitchen using rolling walker, increasing overall activity tolerance/endurance, and functional kitchen tasks. Husband present during entire session. Demonstration with return demonstration completed on donning abdominal binder with husband.   General General Chart Reviewed: Yes Pain None  Therapy/Group: Individual Therapy  [name] 01/06/2011, 2:36 PM

## 2011-01-06 NOTE — Progress Notes (Signed)
Occupational Therapy Session Note  Patient Details  Name: [name] MRN: [id number] Date of Birth: [birth year]  Today's Date: 01/06/2011 Time: 5956-3875 Time Calculation (min): 55 min  Precautions:  Restrictions Weight Bearing Restrictions: Yes LLE Weight Bearing: Touchdown weight bearing  Skilled Therapeutic Interventions/Progress Updates:    Skilled OT for focus on bed mobility with supervision from therapist. Ambulated from EOB to shower seat in bathroom using platform rolling walker with supervison. UB/LB bathing completed at supervision level. Ambulated from shower seat to EOB with supervision to complete UB/LB dressing in sit to stand position with supervision. Focused on completing activities in standing, ambulation to and from shower safely, increasing overall activity tolerance/endurance, grooming tasks standing at sink. Also completed general upper extremity exercises in therapy gym using SCIFIT in standing position ~8 minutes with 3 rest breaks. Patient stated she needed to use bathroom, supervision for transfer and toileting/hygiene.     General General Chart Reviewed: Yes Family/Caregiver Present: No  Pain Patient with no complaints of pain during therapy session  ADL ADL Equipment Provided: Long-handled sponge Grooming: Supervision/safety Where Assessed-Grooming: Standing at sink Upper Body Bathing: Supervision/safety Where Assessed-Upper Body Bathing: Shower Lower Body Bathing: Supervision/safety Where Assessed-Lower Body Bathing: Shower Upper Body Dressing: Supervision/safety Where Assessed-Upper Body Dressing: Edge of Bed Lower Body Dressing: Supervision/safety Where Assessed-Lower Body Dressing: Edge of Bed (sit-stand postion) Toileting: Supervision/safety Where Assessed-Toileting: Teacher, adult education: Camera operator Method: Surveyor, minerals: Other (comment) (elevated toilet seat) Tub/Shower  Transfer: Close supervison Tub/Shower Transfer Method: Nurse, learning disability: Shower seat with back  Therapy/Group: Individual Therapy  [name] 01/06/2011, 9:59 AM

## 2011-01-06 NOTE — Progress Notes (Signed)
Physical Therapy Note  Patient Details  Name: [name] MRN: [id number] Date of Birth: December 22, 1934 Today's Date: 01/06/2011  Pt missed last 7 minutes of 45 minute session secondary to [facility] arriving to pick up pt for radiation tx.    [name] [facility] 01/06/2011, 3:53 PM

## 2011-01-06 NOTE — Progress Notes (Signed)
Physical Therapy Session Note  Patient Details  Name: [name] MRN: [id number] Date of Birth: 1934-04-10  Today's Date: 01/06/2011 Time: 1452-1530 Time Calculation (min): 38 min  Precautions: Precautions Precautions: Other (comment) (touch down weight bearing throughout left lower extremity) Required Braces or Orthoses: No Restrictions Weight Bearing Restrictions: Yes LLE Weight Bearing: Touchdown weight bearing  Short Term Goals: =LTG    Skilled Therapeutic Interventions/Progress Updates: Treatment session focused on standing tolerance while playing a card game and dynamic balance with functional strength for sit to stands. Pt with good tolerance. Missed last 7 min of session (see below).      General Chart Reviewed: Yes Amount of Missed PT Time (min): 7 Minutes (missed last 7 min due Radiation Transport arriving) Vital Signs   Pain Pain Assessment Pain Score:   2 Mobility Transfers Transfers: Yes Sit to Stand: 5: Supervision Stand to Sit: 5: Supervision Stand to Sit Details (indicate cue type and reason): Verbal cues for precautions/safety Squat Pivot Transfers: 5: Nurse, children's Mobility: Yes Wheelchair Assistance: 1: +1 Total assist  Trunk/Postural Assessment     Balance Balance Balance Assessed: Yes Dynamic Standing Balance Dynamic Standing - Level of Assistance: 5: Stand by assistance Dynamic Standing - Balance Activities: Lateral lean/weight shifting;Reaching for objects;Reaching across midline;Forward lean/weight shifting     Other Treatments Treatments Therapeutic Activity: Dynamic standing activity to play Uno card game with focus on standing tolerance while completing cognitive task and maintaining WB status. Pt able to take rest breaks as needed, no c/o lightheadedness during actiivity  Therapy/Group: Individual Therapy  [name] [facility] 01/06/2011, 3:48 PM
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
## 2011-01-06 NOTE — Progress Notes (Signed)
Conference update w/ pt & husband yesterday afternoon:  ELOS 01/12/11 w/ XRT that day early [at 10:00]-should be her last XRT treatment.  Both very happy w/ her progress.

## 2011-01-07 ENCOUNTER — Ambulatory Visit
Admission: RE | Admit: 2011-01-07 | Discharge: 2011-01-07 | Disposition: A | Discharge status: Home / Self Care | Payer: Medicare Other | Source: Ambulatory Visit | Attending: Radiation Oncology | Admitting: Radiation Oncology

## 2011-01-07 MED ORDER — SODIUM CHLORIDE 0.9 % IV SOLN: 250.0000 mL | INTRAVENOUS | Status: DC

## 2011-01-07 MED ORDER — RENA-VITE PO TABS
1.0000 | ORAL_TABLET | Freq: Every day | ORAL | Status: DC
  Administered 2011-01-07 – 2011-01-11 (×5): 1 via ORAL
  Filled 2011-01-07 (×6): qty 1

## 2011-01-07 MED ORDER — SODIUM CHLORIDE 0.9 % IJ SOLN: 3.0000 mL | INTRAMUSCULAR | Status: DC | PRN

## 2011-01-07 MED ORDER — SODIUM CHLORIDE 0.9 % IJ SOLN: 3.0000 mL | Freq: Two times a day (BID) | INTRAMUSCULAR | Status: DC

## 2011-01-07 NOTE — Progress Notes (Signed)
Physical Therapy Session Note  Patient Details  Name: [name] MRN: [id number] Date of Birth: 1934-06-03  Today's Date: 01/07/2011 Time: 1129-1200 Time Calculation (min): 31 min  Precautions: Precautions Precautions: Other (comment) (touch down weight bearing throughout left lower extremity) Required Braces or Orthoses: No Restrictions Weight Bearing Restrictions: Yes LLE Weight Bearing: Touchdown weight bearing  Short Term Goals: = LTGs    Skilled Therapeutic Interventions/Progress Updates: Pt tolerated treatment session very well, no c/o lightheadedness today, only pain in left wrist and left leg. Treatment focused on gait training with left platform RW for strengthening, general upright standing tolerance, and technique for safety with AD. Distance improved and level of assist improved from minA to close supervision.      General Chart Reviewed: Yes   Pain Pain Assessment Pain Score:   3 (declined to notify RN at this time "will wait"). Notified RN prior to end of session. Mobility Transfers Transfers: Yes Sit to Stand: 5: Supervision Stand to Sit: 5: Supervision Stand to Sit Details (indicate cue type and reason): Verbal cues for precautions/safety Locomotion  Ambulation Ambulation: Yes Ambulation/Gait Assistance: 4: Min assist;5: Supervision Ambulation Distance (Feet): 20 Feet (repeated 25', repeated x 50') Assistive device: Left platform walker Ambulation/Gait Assistance Details: Verbal cues for gait pattern (decreased step length for safety) Wheelchair Mobility Wheelchair Mobility: Yes Wheelchair Assistance: 1: +1 Total assist          Exercises Cardiovascular Exercises NuStep: level 3 x 10 min (general activity tolerance and lower extremity strengthening)     Therapy/Group: Individual Therapy  [name] [facility] 01/07/2011, 12:11 PM

## 2011-01-07 NOTE — Progress Notes (Signed)
Physical Therapy Session Note  Patient Details  Name: [name] MRN: [id number] Date of Birth: 03/06/1934  Today's Date: 01/07/2011 Time: 5621-3086 Time Calculation (min): 27 min  Precautions: Precautions Precautions: Other (comment) (touch down weight bearing throughout left lower extremity) Required Braces or Orthoses: No Restrictions Weight Bearing Restrictions: Yes LLE Weight Bearing: Touchdown weight bearing   Skilled Therapeutic Interventions/Progress Updates:  Educated husband on how to break down w/c for loading into car and how to set up w/c to prepare for patient transfer from car to w/c. Husband gave return demonstration.        Pain Pain Assessment Pain Assessment: 0-10 Pain Score:   2 Pain Type: Surgical pain Pain Location: Hip Pain Orientation: Left Pain Descriptors: Aching Pain Onset: On-going Patients Stated Pain Goal: 0 Pain Intervention(s): RN made aware Multiple Pain Sites: Yes 2nd Pain Site Pain Location: Leg (left leg) 3rd Pain Site Pain Location: Wrist (left)   Locomotion  Ambulation Ambulation: Yes Ambulation/Gait Assistance: 5: Supervision Ambulation Distance (Feet): 40 Feet (plus 20 feet) Assistive device: Left platform walker Ambulation/Gait Assistance Details: Visual cues/gestures for sequencing;Verbal cues for sequencing Ambulation/Gait Assistance Details (indicate cue type and reason): shoe placed on R foot to improve L foot clearance and compliance with TWB; verbal and visual cues for shoulder depression to increased body elevation and maintain WB status, verbal cues to maintain WB status with fatigue    Therapy/Group: Individual Therapy  [name] [facility] 01/07/2011, 3:44 PM

## 2011-01-07 NOTE — Progress Notes (Signed)
Pt alert and oriented, pt transfers from bed to wheelchair with moderate assist of one.  Pt's pain is managed with current medication regimen.  Incision's to left hip are healing,  Clean , dry and intact; open to air.  Pt has bruising to upper and lower extremities.  Pt has two small skin tears to bilateral elbows covered with tegaderm.  Pt wears a brace to left wrist. Pt's blood pressure has been with in normal limits.  Pt has had urgency with urination; pt keeps a bed pan at her bedside and is able to use it.  Pt was transferred via [facility] to [place] for out patient radiology.  Pt is TTWD to left leg.  Pt is continent  Of urine, and independent with meals; appetite good.  Pt is actively participating in therapies. Pt sat in wheelchair most of shift,  Husband present at the bedside. [name]

## 2011-01-07 NOTE — Progress Notes (Signed)
Social Work  Met with patient this morning to provide emotional support.  Patient remains very bright and optimistic about rehab progress.  She does worry about her husband and the amount of anxiety he is having about "making sure everything is right at home".  Assured her that I will be making follow up arrangements, ordering DME, etc. But will also review this with her husband as well.   Pt denies any significant stressors at this time.  Will follow up with husband today.  Lily Kernen

## 2011-01-07 NOTE — Progress Notes (Signed)
     Subjective/Complaints: persistent left wrist pain.  Blood pressures were low again yesterday but she seems to be tolerating activity better. Review of Systems  Constitutional: Negative.   Respiratory: Negative.   Cardiovascular: Negative.   Musculoskeletal: Positive for joint pain.  Skin: Negative.    Objective: Vital Signs: Blood pressure 116/52, pulse 82 temperature 98.5 F (36.9 C), temperature source Oral, resp. rate 15, height 5\' 7"  (1.702 m), weight 64.43 kg (142 lb 0.7 oz), SpO2 94.00%. No results found. No results found for this basename: WBC:2,HGB:2,HCT:2,PLT:2 in the last 72 hours No results found for this basename: NA:2,K:2,CL:2,CO2:2,GLUCOSE:2,BUN:2,CREATININE:2,CALCIUM:2 in the last 72 hours  Physical Exam: General appearance: alert Head: Normocephalic, without obvious abnormality, atraumatic Eyes: conjunctivae/corneas clear. PERRL, EOM's intact. Fundi benign. Ears: normal TM's and external ear canals both ears Nose: Nares normal. Septum midline. Mucosa normal. No drainage or sinus tenderness. Throat: lips, mucosa, and tongue normal; teeth and gums normal Neck: no adenopathy, no carotid bruit, no JVD, supple, symmetrical, trachea midline and thyroid not enlarged, symmetric, no tenderness/mass/nodules Back: symmetric, no curvature. ROM normal. No CVA tenderness. Resp: clear to auscultation bilaterally Cardio: regular rate and rhythm, S1, S2 normal, no murmur, click, rub or gallop GI: soft, non-tender; bowel sounds normal; no masses,  no organomegaly Extremities: extremities normal, atraumatic, no cyanosis or edema Pulses: 2+ and symmetric Skin: Skin color, texture, turgor normal. No rashes or lesions Neurologic: Grossly normal Incision/Wound:  left hip wound clean and intact   Assessment/Plan: 1. Functional deficits secondary to left intertrochanteric hip fracture which require 3+ hours per day of interdisciplinary therapy in a comprehensive inpatient rehab  setting. Physiatrist is providing close team supervision and 24 hour management of active medical problems listed below. Physiatrist and rehab team continue to assess barriers to discharge/monitor patient progress toward functional and medical goals. Mobility: Bed Mobility Bed Mobility: Yes Supine to Sit: 5: Supervision Transfers Transfers: Yes Stand Pivot Transfers: 5: Public librarian Mobility: Yes Wheelchair Assistance: 5: Supervision Wheelchair Propulsion: Both upper extremities ADL:   Cognition:      2. Anticoagulation/DVT prophylaxis with Pharmaceutical: Lovenox/ TEDS  3. Pain Management: prn and scheduled oxycodone, tylenol, robaxin, ultram.  Tolerance improving  Patient Active Hospital Problem List: Closed intertrochanteric fracture of right hip (01/02/2011)    POA: Yes   Assessment:    Plan: continue rehab. Team conf today. Wound clean Acute blood loss anemia (01/02/2011)    POA: No   Assessment:  Dietary supplementation.  Hgb trending up   Plan:  Orthostasis (01/02/2011)    POA: No   Assessment: persistent   Plan: .  Increased midodrine. Timing of med changed.  TEDS/ Abd binder. Needs to take additional time in the am before rising. Labs are stable. bp looks a little better this am Low serum cortisol level   Plan:   Reduced prednisone to 5mg  qd.  F/u as out pt ONC: Tolerating xrt so far.   10  Laurianne Floresca T

## 2011-01-07 NOTE — Progress Notes (Signed)
Occupational Therapy Session Note  Patient Details  Name: [name] MRN: [id number] Date of Birth: [birth year]  Today's Date: 01/07/2011 Time: 1030-1128 Time Calculation (min): 58 min  Precautions: Required Braces or Orthoses: Left wrist splint at this time Restrictions Weight Bearing Restrictions: Yes LLE Weight Bearing: Touchdown weight bearing  STG=LTG  Skilled Therapeutic Interventions/Progress Updates:    1:1 OT session engaging in ADL retraining at shower level in ADL apartment on tub transfer bench in tub/shower. Focused skilled intervention on functional ambulation using platform rolling walker, UB/LB bathing in seated position at Mod I level, UB/LB dressing in sit to stand position edge of bed, increasing overall activity tolerance/endurance. Also engaged in general upper extremity exercises using SCIFIT to focus on UE strengthening, dynamic balance/endurance, and overall activity tolerance/endurance. Husband present during entire session for education/training on ADLs and safety. Discussed home bathroom set up with husband and patient.   Pain Patient with no complaints of pain at this time  ADL ADL Equipment Provided: Long-handled sponge Upper Body Bathing: Mod I in seated position Where Assessed-Upper Body Bathing: Shower Lower Body Bathing: Mod I in seated position Where Assessed-Lower Body Bathing: Shower Upper Body Dressing: Supervision/safety/set up assist Where Assessed-Upper Body Dressing: Edge of Bed Lower Body Dressing: Supervision/safety/set up assist Where Assessed-Lower Body Dressing: Edge of Bed sit to stand position Tub/Shower Transfer: Close supervison Tub/Shower Transfer Method: Ambulating Tub/Shower Equipment: Tub transfer bench     Therapy/Group: Individual Therapy  Gentri Guardado 01/07/2011, 11:43 AM

## 2011-01-07 NOTE — Progress Notes (Signed)
Occupational Therapy Session Note  Patient Details  Name: LAINIE DAUBERT MRN: [id number] Date of Birth: 03-Jun-1934  Today's Date: 01/07/2011 Time: 1400-1445 Time Calculation (min): 45 min  Precautions: Precautions Precautions: Other (comment) (touch down weight bearing throughout left lower extremity) Required Braces or Orthoses: Wrist splint to left wrist  Restrictions Weight Bearing Restrictions: Yes LLE Weight Bearing: Touchdown weight bearing  STG=LTG  Skilled Therapeutic Interventions/Progress Updates:    Therapeutic activity to focus on overall activity tolerance/endurance, w/c mobility/propulsion, functional ambulation using platform rolling walker, increasing independence with IADL tasks. Patient with safe w/c mobility and functional ambulation using rolling walker. Also completed toilet transfer (ambulating) and toileting using elevated toilet seat.   Pain  No complaints of pain during session  ADL Toileting: Supervision/safety Where Assessed-Toileting: Actuary Transfer: Camera operator Method: Surveyor, minerals: Other (comment) (elevated toilet seat)   Therapy/Group: Individual Therapy  Amoni Morales 01/07/2011, 3:23 PM

## 2011-01-07 NOTE — Progress Notes (Signed)
Physical Therapy Session Note  Patient Details  Name: [name] MRN: [id number] Date of Birth: 1934/06/05  Today's Date: 01/07/2011 Time: 0828-0900 Time Calculation (min): 32 min  Precautions: Precautions Precautions: Other (comment) (touch down weight bearing throughout left lower extremity) Required Braces or Orthoses: No Restrictions Weight Bearing Restrictions: Yes LLE Weight Bearing: Touchdown weight bearing  Short Term Goals:=LTGS    Skilled Therapeutic Interventions/Progress Updates: Treatment session focused on general leg strengthening in supine with weights for overall strengthening and activity tolerance. AAROM/AROM on left without weight, and 3# weight on RLE.     General Chart Reviewed: Yes  Pain Pain Assessment Pain Score: 0-No pain                 Exercises General Exercises - Lower Extremity Short Arc Quad: Other reps (comment) (15 reps, 3# right) Heel Slides: 15 reps;Strengthening;Both (right with 3#) Hip ABduction/ADduction: 15 reps (3# right) Straight Leg Raises: 15 reps;Right;Strengthening (3# weight)     Therapy/Group: Individual Therapy  [name] [facility] 01/07/2011, 9:02 AM

## 2011-01-08 ENCOUNTER — Ambulatory Visit
Admission: RE | Admit: 2011-01-08 | Discharge: 2011-01-08 | Disposition: A | Discharge status: Home / Self Care | Payer: Medicare Other | Source: Ambulatory Visit | Attending: Radiation Oncology | Admitting: Radiation Oncology

## 2011-01-08 NOTE — Progress Notes (Signed)
Occupational Therapy Session Note  Patient Details  Name: [name] MRN: [id number] Date of Birth: September 24, 1934  Today's Date: 01/08/2011 Time: 0920-1020 Time Calculation (min): 60 min  Precautions: Precautions Precautions: Other (comment) (touch down weight bearing throughout left lower extremity) Required Braces or Orthoses: Wrist splint to left UE Restrictions Weight Bearing Restrictions: Yes LLE Weight Bearing: Touchdown weight bearing  Skilled Therapeutic Interventions/Progress Updates:    Engaged in ADL retraining at shower level in ADL apartment. Skilled OT for focus on increasing overall activity tolerance/endurance, UB/LB bathing and dressing in sit to stand position, functional mobility using platform rolling walker. Husband present during session for education and training. Husband performed return demonstration on donning bilateral TED hose. Also engaged in therapeutic exercise using Nustep in therapy gym for emphasis on range of motion to LLE and increasing overall endurance to increase independence with functional mobility and increase overall independence with ADLs/IADLs.   Pain No complaints of pain during session  ADL ADL Equipment Provided: Long-handled sponge Eating: Independent Where Assessed-Eating: Edge of bed Upper Body Bathing: Set up assist Where Assessed-Upper Body Bathing: Shower Lower Body Bathing: Supervision/safety Where Assessed-Lower Body Bathing: Shower Upper Body Dressing: Supervision/safety Where Assessed-Upper Body Dressing: Edge of bed Lower Body Dressing: Supervision/safety Where Assessed-Lower Body Dressing: Edge of bed (sit-stand postion) Tub/Shower Transfer: Close supervison Tub/Shower Transfer Method: Ambulating Tub/Shower Equipment: Tub transfer bench  Therapy/Group: Individual Therapy  Azyria Osmon 01/08/2011, 10:23 AM

## 2011-01-08 NOTE — Progress Notes (Signed)
Per State Regulation 482.30 This chart was reviewed for medical necessity with respect to the patient's Admission/Duration of stay. Pt continues to improve.  Blood pressure better w/ medication management plus teds & abdominal binder when out of bed.  Pt w/ increased activity tolerance even while receiving XRT.  Husband beginning family education.  Pt says ramp is up at home.   Brock Ra                 Nurse Care Manager              Next Review Date: 01/11/11

## 2011-01-08 NOTE — Progress Notes (Signed)
Physical Therapy Note  Patient Details  Name: BERKLIE DETHLEFS MRN: [id number] Date of Birth: Dec 02, 1934 Today's Date: 01/08/2011  Treatment 1515-1600 Individual Therapy No c/o pain.  Treatment session focused on dynamic balance to play Wii Bowling with 1 UE support as needed and maintaining WB status. Overall S. Gait training with platform RW for general strengthening x 160' to gym, supervision. Intermittent cueing for WB status. Overall no c/o of lightheadedness this session and pt tolerating treatment well. [facility] arrived to pick up pt for radiation treatment at end of session.   [name] [facility] 01/08/2011, 4:07 PM

## 2011-01-08 NOTE — Progress Notes (Signed)
      Subjective/Complaints: Left wrist pain better.  Blood pressures are stabilizing Review of Systems  Constitutional: Negative.   Respiratory: Negative.   Cardiovascular: Negative.   Musculoskeletal: Positive for joint pain.  Skin: Negative.    Objective: Vital Signs: Blood pressure 116/52, pulse 82 temperature 98.5 F (36.9 C), temperature source Oral, resp. rate 15, height 5\' 7"  (1.702 m), weight 64.43 kg (142 lb 0.7 oz), SpO2 94.00%.    Physical Exam: General appearance: alert Head: Normocephalic, without obvious abnormality, atraumatic Eyes: conjunctivae/corneas clear. PERRL, EOM's intact. Fundi benign. Ears: normal TM's and external ear canals both ears Nose: Nares normal. Septum midline. Mucosa normal. No drainage or sinus tenderness. Throat: lips, mucosa, and tongue normal; teeth and gums normal Neck: no adenopathy, no carotid bruit, no JVD, supple, symmetrical, trachea midline and thyroid not enlarged, symmetric, no tenderness/mass/nodules Back: symmetric, no curvature. ROM normal. No CVA tenderness. Resp: clear to auscultation bilaterally Cardio: regular rate and rhythm, S1, S2 normal, no murmur, click, rub or gallop GI: soft, non-tender; bowel sounds normal; no masses,  no organomegaly Extremities: extremities normal, atraumatic, no cyanosis or edema Pulses: 2+ and symmetric Skin: Skin color, texture, turgor normal. No rashes or lesions Neurologic: Grossly normal Incision/Wound:  left hip wound clean and intact   Assessment/Plan: 1. Functional deficits secondary to left intertrochanteric hip fracture which require 3+ hours per day of interdisciplinary therapy in a comprehensive inpatient rehab setting. Physiatrist is providing close team supervision and 24 hour management of active medical problems listed below. Physiatrist and rehab team continue to assess barriers to discharge/monitor patient progress toward functional and medical goals. Mobility: Bed  Mobility Bed Mobility: Yes Supine to Sit: 5: Supervision Transfers Transfers: Yes Stand Pivot Transfers: 5: Public librarian Mobility: Yes Wheelchair Assistance: 5: Supervision Wheelchair Propulsion: Both upper extremities ADL:   Cognition:      2. Anticoagulation/DVT prophylaxis with Pharmaceutical: Lovenox/ TEDS  3. Pain Management: prn and scheduled oxycodone, tylenol, robaxin, ultram.  Tolerance improving  Patient Active Hospital Problem List: Closed intertrochanteric fracture of right hip (01/02/2011)    POA: Yes   Assessment:    Plan: continue rehab. Team conf today. Wound clean Acute blood loss anemia (01/02/2011)    POA: No   Assessment:  Dietary supplementation.  Hgb trending up. Recheck Monday.   Plan:  Orthostasis (01/02/2011)    POA: No   Assessment: persistent   Plan: .  Increased midodrine. Timing of med changed.  TEDS/ Abd binder. SBP in 100's.  I'm very pleased with her progress here, and the patient is as well. Low serum cortisol level   Plan:   Reduced prednisone to 5mg  qd.  F/u as out pt ONC: Tolerating xrt so far treatment 5 today.   10  SWARTZ,ZACHARY T

## 2011-01-08 NOTE — Progress Notes (Signed)
Physical Therapy Weekly Progress Note  Patient Details  Name: [name] MRN: [id number] Date of Birth: 1934-12-13 Today's Date: 01/08/2011  Treatment time 6440-3474, individual therapy. Premedicated for pain. Treatment session focused on standing tolerance for functional task and dynamic balance, simulated car transfer for family education, and functional transfers with emphasis on safety and technique. Pt seated BP = 100/58, 101/61 and in standing 84/55 with lightheaded symptoms, which limited pt's ability to perform gait this AM. Pt's husband return demonstrated basic and car transfers at supervision level.   Pt is s/p left hip fx with surgical intervention who is making good progress while on CIR. Pt is currently functioning at an overall supervision/minA level. Pt is S for transfers and dynamic standing and fluctuates from S/minA for gait short distances limited due to orthostatic symptoms. Pt's w/c mobility has been limited due to left wrist pain for which she now wears a splint, but pt able to propel at supervision/mod I level. Patient continues to demonstrate the following deficits: decreased activity tolerance, decreased balance, difficulty walking, orthostasis, decreased functional mobility, decreased strength  and therefore will continue to benefit from skilled PT intervention to enhance overall performance with activity tolerance, balance, ability to compensate for deficits, functional use of  right upper extremity, right lower extremity, left upper extremity and left lower extremity, coordination and knowledge of precautions.  Pt's husband has been observing and participating in ongoing family education and will continue to finalize prior to d/c. Ramp for home access has been built due to pt's WB status and decreased ability to safely climb stairs.   See Patient's Care Plan for progression toward long term goals.  Patient progressing toward long term goals..  Continue plan of  care.  [name] [facility] 01/08/2011, 11:52 AM

## 2011-01-08 NOTE — Progress Notes (Signed)
S/p L Hip fx. TDWB. Stand/pivot transfer with Min A. Ambulates with rolling walker at times, but  limited by orthostaic episodes.  Abdominal binder, and Midodrine for orthostasis.  Remains contient of bowel and bladder. Last bowel movement 01/06/11.  L hand splint.in use for joint discomfort. Tegaderm intact to L elbow and R hand skin tear. Visible  L heel discoloration., and LLE edema. Pain managed with scheduled Oxy IR 10mg , Neurotin 200mg , and prn Robaxin 500mg  x 1 dose.  Pt reports 3/10 pain discomfort to L hip prior to therapy. 5/10 after therapy.. C/o of nausea at end of morning therapy session. Phenergan 12..5mg  effective. Resting in bed between sessions. Husband at bedside supportive, as well as participating in family education and transfers. Discharge pending. Continue with plan of care.

## 2011-01-08 NOTE — Progress Notes (Signed)
Occupational Therapy Session Note  Patient Details  Name: [name] MRN: [id number] Date of Birth: 21-Jun-1934  Today's Date: 01/08/2011 Time: 1330-1400 Time Calculation (min): 30 min  Precautions: Precautions Precautions: Other (comment) (touch down weight bearing throughout left lower extremity) Required Braces or Orthoses: Left wrist splint Restrictions Weight Bearing Restrictions: Yes LLE Weight Bearing: Touchdown weight bearing  Skilled Therapeutic Interventions/Progress Updates:    No complaints of pain during session.  Patient stated "not feeling well" and proceeded with explanation of nausea and increased left lower extremity pain after morning physical therapy session. Patient did state she was feeling somewhat better. Nursing aware of statement.  Engaged in bed mobility to sit EOB to eat lunch. Therapeutic exercise focusing on wrist strengthening, fine motor, and gross motor using theraputty.  Therapy/Group: Individual Therapy  Emary Zalar 01/08/2011, 2:16 PM

## 2011-01-09 NOTE — Progress Notes (Signed)
Patient alert, oriented x3, able to make needs known. On scheduled pain medicine. Denies pain at this time. Continent of bowl and bladder. Abdominal binder when out of bed and teds. Orthostatic daily.

## 2011-01-09 NOTE — Progress Notes (Signed)
       Subjective/Complaints: Left wrist pain better.  Blood pressures are stabilizing Review of Systems  Constitutional: Negative.   Respiratory: Negative.   Cardiovascular: Negative.   Musculoskeletal: Positive for joint pain.  Skin: Negative.    Objective: Vital Signs: Blood pressure 116/52, pulse 82 temperature 98.5 F (36.9 C), temperature source Oral, resp. rate 15, height 5\' 7"  (1.702 m), weight 64.43 kg (142 lb 0.7 oz), SpO2 94.00%.    Physical Exam: General appearance: alert Head: Normocephalic, without obvious abnormality, atraumatic Eyes: conjunctivae/corneas clear. PERRL, EOM's intact. Fundi benign. Ears: normal TM's and external ear canals both ears Nose: Nares normal. Septum midline. Mucosa normal. No drainage or sinus tenderness. Throat: lips, mucosa, and tongue normal; teeth and gums normal Neck: no adenopathy, no carotid bruit, no JVD, supple, symmetrical, trachea midline and thyroid not enlarged, symmetric, no tenderness/mass/nodules Back: symmetric, no curvature. ROM normal. No CVA tenderness. Resp: clear to auscultation bilaterally Cardio: regular rate and rhythm, S1, S2 normal, no murmur, click, rub or gallop GI: soft, non-tender; bowel sounds normal; no masses,  no organomegaly Extremities: extremities normal, atraumatic, no cyanosis or edema Pulses: 2+ and symmetric Skin: Skin color, texture, turgor normal. No rashes or lesions Neurologic: Grossly normal Incision/Wound:  left hip wound clean and intact   Assessment/Plan: 1. Functional deficits secondary to left intertrochanteric hip fracture which require 3+ hours per day of interdisciplinary therapy in a comprehensive inpatient rehab setting. Physiatrist is providing close team supervision and 24 hour management of active medical problems listed below. Physiatrist and rehab team continue to assess barriers to discharge/monitor patient progress toward functional and medical goals. Mobility: Bed  Mobility Bed Mobility: Yes Supine to Sit: 5: Supervision Transfers Transfers: Yes Stand Pivot Transfers: 5: Public librarian Mobility: Yes Wheelchair Assistance: 5: Supervision Wheelchair Propulsion: Both upper extremities ADL:   Cognition:      2. Anticoagulation/DVT prophylaxis with Pharmaceutical: Lovenox/ TEDS  3. Pain Management: prn and scheduled oxycodone, tylenol, robaxin, ultram.  Tolerance improving  Patient Active Hospital Problem List: Closed intertrochanteric fracture of right hip (01/02/2011)    POA: Yes   Assessment:    Plan: continue rehab. Team conf today. Wound clean Acute blood loss anemia (01/02/2011)    POA: No   Assessment:  Dietary supplementation.  Hgb trending up. Recheck Monday.   Plan:  Orthostasis (01/02/2011)    POA: No   Assessment: improved.   Plan: .  Increased midodrine. Timing of med changed.  TEDS/ Abd binder. SBP in 100's.  I'm very pleased with her progress here, and the patient is as well. Low serum cortisol level   Plan:   Reduced prednisone to 5mg  qd.  F/u as out pt ONC: Tolerating xrt so far treatment 5 today.   10  SWARTZ,ZACHARY T

## 2011-01-09 NOTE — Progress Notes (Addendum)
Physical Therapy Note  Patient Details  Name: [name] MRN: [id number] Date of Birth: Feb 15, 1935 Today's Date: 01/09/2011 Time: 1030-1130  Pain: 4 to 6/10 L thigh and anterolateral knee.  Patient was pre-medicated > one hour prior to P.T. today. Patient reports that she overdid her exercise on the NuStep yesterday resulting in significant increase in L thigh pain and nausea last night and today.  Patient also noting sore, pain at L dorsum of hand and is inquiring about heat.  Nursing notified and assisted in the delivery of warm pack for L anterolateral knee and L dorsum of hand.  Therapeutic Exercise: B LE's in supine with ankle pumps, quad sets, gluteal sets, AAROM L heel slides, AAROM L hi                                                                          Abduction  Wheelchair mobility: Supervised/set-up x 150'  Therapeutic Activity x 30': Transfer Training to include bed mobility and supine<->sit and bed<->w/c with Supervised/set-up. Patient able to return demonstration for in/out of bed via logroll onto R side and via supine to sit in bed and then scooting to and from edge of bed.  Patient's husband present throughout Treatment session and expresses concern re; tub transfers.  Addressed questions he had regarding whether glass sliding doors will get in the way.  Advised patient to remove the sliding doors and to place shower curtain on expandable shower rod.  Patient and husband indicated that they would be able to get this placed.  Patient required rest periods throughout tx session today due to pain in L lateral thigh and anterolateral knee.    Individual Treatment Session.  Jodelle Gross 01/09/2011, 11:44 AM

## 2011-01-10 NOTE — Progress Notes (Signed)
        Subjective/Complaints: Left wrist pain better.  Blood pressures are stabilizing Review of Systems  Constitutional: Negative.   Respiratory: Negative.   Cardiovascular: Negative.   Musculoskeletal: Positive for joint pain.  Skin: Negative.    Objective: Vital Signs: Blood pressure 116/52, pulse 82 temperature 98.5 F (36.9 C), temperature source Oral, resp. rate 15, height 5\' 7"  (1.702 m), weight 64.43 kg (142 lb 0.7 oz), SpO2 94.00%.    Physical Exam: General appearance: alert Head: Normocephalic, without obvious abnormality, atraumatic Eyes: conjunctivae/corneas clear. PERRL, EOM's intact. Fundi benign. Ears: normal TM's and external ear canals both ears Nose: Nares normal. Septum midline. Mucosa normal. No drainage or sinus tenderness. Throat: lips, mucosa, and tongue normal; teeth and gums normal Neck: no adenopathy, no carotid bruit, no JVD, supple, symmetrical, trachea midline and thyroid not enlarged, symmetric, no tenderness/mass/nodules Back: symmetric, no curvature. ROM normal. No CVA tenderness. Resp: clear to auscultation bilaterally Cardio: regular rate and rhythm, S1, S2 normal, no murmur, click, rub or gallop GI: soft, non-tender; bowel sounds normal; no masses,  no organomegaly Extremities: extremities normal, atraumatic, no cyanosis or edema Pulses: 2+ and symmetric Skin: Skin color, texture, turgor normal. No rashes or lesions Neurologic: Grossly normal Incision/Wound:  left hip wound clean and intact   Assessment/Plan: 1. Functional deficits secondary to left intertrochanteric hip fracture which require 3+ hours per day of interdisciplinary therapy in a comprehensive inpatient rehab setting. Physiatrist is providing close team supervision and 24 hour management of active medical problems listed below. Physiatrist and rehab team continue to assess barriers to discharge/monitor patient progress toward functional and medical goals. Mobility: Bed  Mobility Bed Mobility: Yes Supine to Sit: 5: Supervision Transfers Transfers: Yes Stand Pivot Transfers: 5: Public librarian Mobility: Yes Wheelchair Assistance: 5: Supervision Wheelchair Propulsion: Both upper extremities ADL:   Cognition:      2. Anticoagulation/DVT prophylaxis with Pharmaceutical: Lovenox/ TEDS  3. Pain Management: prn and scheduled oxycodone, tylenol, robaxin, ultram.  Tolerance improving  Patient Active Hospital Problem List: Closed intertrochanteric fracture of right hip (01/02/2011)    POA: Yes   Assessment:    Plan: continue rehab. Team conf today. Wound clean Acute blood loss anemia (01/02/2011)    POA: No   Assessment:  Dietary supplementation.  Hgb trending up. Recheck Monday.   Plan:  Orthostasis (01/02/2011)    POA: No   Assessment: improved.   Plan: .  Increased midodrine. Timing of med changed.  TEDS/ Abd binder. SBP in 100's.  I'm very pleased with her progress here, and the patient is as well.  Will need to continue in the long term. Low serum cortisol level   Plan:   Reduced prednisone to 5mg  qd.  F/u as out pt ONC: Tolerating xrt so far treatment 5 today.   10  SWARTZ,ZACHARY T

## 2011-01-10 NOTE — Progress Notes (Signed)
Last bowel movement 11/10. Continent of bowel and bladder. Minimal assist for transfer with walker or wheelchair. Family at bedside. On scheduled pain medicine for L hip pain. Robaxin given with relief.

## 2011-01-10 NOTE — Progress Notes (Signed)
Occupational Therapy Session Note  Patient Details  Name: [name] MRN: [id number] Date of Birth: 06-25-1934  Today's Date: 01/10/2011 Time: 1100-1155 Time Calculation (min): 55 min  Precautions: Required Braces or Orthoses: Splint to left wrist LLE Weight Bearing: Touchdown weight bearing  Skilled Therapeutic Interventions/Progress Updates:    Engaged in functional ambulation/mobility using platform rolling walker in hallway to therapy gym. Patient with rest break once in gym after ambulation. Focused skilled OT on therapeutic activity in gym with emphasis on dynamic standing balance/tolerance with and without upper extremity support, UE strengthening, increasing overall activity tolerance/endurance, adhering to touchdown weight bearing status to LLE, sit to stands, stand to sits. Husband present and discussed safety awareness and importance for D/C to home Tuesday 01/12/11.   Pain Pain Assessment Pain Score: 0-No pain  Therapy/Group: Individual Therapy  [name] 01/10/2011, 12:06 PM

## 2011-01-11 ENCOUNTER — Ambulatory Visit
Admission: RE | Admit: 2011-01-11 | Discharge: 2011-01-11 | Disposition: A | Discharge status: Home / Self Care | Payer: Medicare Other | Source: Ambulatory Visit | Attending: Radiation Oncology | Admitting: Radiation Oncology

## 2011-01-11 DIAGNOSIS — C50919 Malignant neoplasm of unspecified site of unspecified female breast: Secondary | ICD-10-CM

## 2011-01-11 LAB — CBC
MCV: 96.3 fL (ref 78.0–100.0)
Platelets: 176 10*3/uL (ref 150–400)
RBC: 3.74 MIL/uL — ABNORMAL LOW (ref 3.87–5.11)
RDW: 15.5 % (ref 11.5–15.5)
WBC: 6 10*3/uL (ref 4.0–10.5)

## 2011-01-11 LAB — BASIC METABOLIC PANEL
Chloride: 98 mEq/L (ref 96–112)
Creatinine, Ser: 0.78 mg/dL (ref 0.50–1.10)
GFR calc Af Amer: >90 mL/min (ref >90)
Sodium: 135 mEq/L (ref 135–145)

## 2011-01-11 MED ORDER — POLYSACCHARIDE IRON 150 MG PO CAPS: 150.0000 mg | ORAL_CAPSULE | Freq: Two times a day (BID) | ORAL | Status: DC

## 2011-01-11 MED ORDER — OXYCODONE HCL 10 MG PO TABS: 10.0000 mg | ORAL_TABLET | Freq: Four times a day (QID) | ORAL | Status: AC | PRN

## 2011-01-11 MED ORDER — PREDNISONE 5 MG PO TABS: 5.0000 mg | ORAL_TABLET | ORAL | Status: AC

## 2011-01-11 MED ORDER — POTASSIUM CHLORIDE CRYS ER 20 MEQ PO TBCR: 20.0000 meq | EXTENDED_RELEASE_TABLET | Freq: Every day | ORAL | Status: DC

## 2011-01-11 MED ORDER — MAGNESIUM OXIDE 400 MG PO TABS: 400.0000 mg | ORAL_TABLET | Freq: Two times a day (BID) | ORAL | Status: DC

## 2011-01-11 MED ORDER — TRAMADOL HCL 50 MG PO TABS: 50.0000 mg | ORAL_TABLET | Freq: Four times a day (QID) | ORAL | Status: AC | PRN

## 2011-01-11 MED ORDER — MIDODRINE HCL 5 MG PO TABS: 5.0000 mg | ORAL_TABLET | Freq: Two times a day (BID) | ORAL | Status: AC

## 2011-01-11 MED ORDER — METHOCARBAMOL 500 MG PO TABS: 500.0000 mg | ORAL_TABLET | Freq: Four times a day (QID) | ORAL | Status: AC | PRN

## 2011-01-11 NOTE — Progress Notes (Signed)
Occupational Therapy Discharge Summary and Session Note  Patient Details  Name: [name] MRN: [id number] Date of Birth: 03/03/1934 Today's Date: 01/11/2011  Patient has met 9 of 9 long term goals due to improved activity tolerance, improved balance and ability to compensate for deficits.  Patient's care partner is independent to provide the necessary physical assistance at discharge as needed.  See Navigator for information regarding discharge information and overall status for activities of daily living. Overall, patient is performing at a supervision level. Patient will have husband for 24/7 supervision/assistance prn.   Recommendation:  No additional occupational therapy recommended at this time.   Equipment: Equipment provided: [employer] agrees with progress made and goals achieved: Yes  Treatment Time In:  0900 Time Out:  1000 60 Minutes  Session Note Treatment focus on ADL retraining at shower level using shower seat with back. Patient performing at an overall supervision level for aspects of daily living. Husband present for entire session with hands on assistance prn and performing supervision to patient during ADL. Discussion on safety and decreasing falls with patient and husband completed. Patient to D/C tomorrow after radiation therapy.   Pain Patient with 3/10 pain. RN aware.   Kendrah Lovern 01/11/2011, 10:42 AM

## 2011-01-11 NOTE — Progress Notes (Signed)
Physical Therapy Discharge Summary & Treatment   Patient Details  Name: [name] MRN: [id number] Date of Birth: August 30, 1934 Today's Date: 01/11/2011  Treatment: 1005-1100, individual therapy. Premedicated for L hip pain. Treatment session focused on gait training with L platform walker maintaining TDWB status for general strengthening and activity tolerance, cues for posture. Noted pt tending to lean to L during weightshift. W/c propulsion for UE strengthening x150', c/o wrist pain limiting further distance. Pt with orthostatic BP in standing when attempting gait again, see doc flow sheets for related BPs (83/55 standing). Supine therex for general lower extremity strengthening and issued handout for HEP for which pt is independent. Used 3# ankle weight for RLE. Husband present to review final education for d/c tomorrow.  Treatment: 1300-1345, individual therapy, denies pain. Session emphasis on final d/c planning with gait in home environment with L platform RW and bed mobility in ADL apartment bed at supervision level. Gait in controlled environment > 160' with S, with husband providing A and cues as needed. Reviewed car transfer with pt's husband and return demo S level, cueing needed for safety. No c/o lightheaded symptoms this session.  Treatment 1530-1600 BP supine 94/64/sit 92/69/ stand 82/51 Gait 75 feet X 2 PFRW supervision  Patient has met 9 of 9 long term goals due to improved activity tolerance, improved balance, improved postural control, increased strength, increased range of motion, decreased pain, ability to compensate for deficits, functional use of  right upper extremity, right lower extremity, left upper extremity and left lower extremity and improved coordination.  Patient to discharge at a wheelchair level Modified Independent and S level for gait short distances with L platform RW. Education has been provided to pt and husband regarding safety with pt's orthostatic symptoms  and when to use RW vs. W/C for mobility.  Patient's care partner is independent to provide the necessary physical assistance at discharge. A ramp has been built for home entry and otherwise house is w/c accessible and 1 level.  Recommendation:  Patient will benefit from ongoing skilled PT services in home health setting to continue to advance safe functional mobility, address ongoing impairments in gait, balance, strength, activity tolerance, and minimize fall risk.  Equipment: Equipment provided: 76 x18 w/c with elevating leg rests, L platform (pt already owns RW)  Patient/family agrees with progress made and goals achieved: Yes  Tedd Sias 01/11/2011, 10:59 AM

## 2011-01-11 NOTE — Progress Notes (Signed)
Per State Regulation 482.30 This chart was reviewed for medical necessity with respect to the patient's Admission/Duration of stay.  Pt progressing very well. Family ed being completed.  Last XRT tomorrow am w/ d/c home planned for the afternoon.  Brock Ra                 Nurse Care Manager              Next Review Date: For d/c 01/12/11

## 2011-01-11 NOTE — Progress Notes (Signed)
[name] Weekly Treatment Note    Name: [name] [name] Date: 01/11/2011 MRN: [id number] DOB: 1934-06-13  Status:outpatient    Current dose: 5800cGy  Current fraction:29  Planned dose:6000cGy  Planned fraction:30   ALLERGIES: Penicillins; Adhesive; and Doxycycline    NARRATIVE: [name] was seen today for weekly treatment management. The chart was checked and port films images were reviewed.  PHYSICAL EXAMINATION: vitals were not taken for this visit.       Mild erythema and hyperpigmentation of skin. No desquamation.    ASSESSMENT: Patient tolerating treatments well. To finish XRT tomorrow.   PLAN: Continue treatment as planned. One month FU.

## 2011-01-11 NOTE — Progress Notes (Signed)
         Subjective/Complaints: Left wrist pain better.  Blood pressures are stabilizing Review of Systems  Constitutional: Negative.   Respiratory: Negative.   Cardiovascular: Negative.   Musculoskeletal: Positive for joint pain.  Skin: Negative.    Objective: Vital Signs: Blood pressure 116/52, pulse 82 temperature 98.5 F (36.9 C), temperature source Oral, resp. rate 15, height 5\' 7"  (1.702 m), weight 64.43 kg (142 lb 0.7 oz), SpO2 94.00%.    Physical Exam: General appearance: alert Head: Normocephalic, without obvious abnormality, atraumatic Eyes: conjunctivae/corneas clear. PERRL, EOM's intact. Fundi benign. Ears: normal TM's and external ear canals both ears Nose: Nares normal. Septum midline. Mucosa normal. No drainage or sinus tenderness. Throat: lips, mucosa, and tongue normal; teeth and gums normal Neck: no adenopathy, no carotid bruit, no JVD, supple, symmetrical, trachea midline and thyroid not enlarged, symmetric, no tenderness/mass/nodules Back: symmetric, no curvature. ROM normal. No CVA tenderness. Resp: clear to auscultation bilaterally Cardio: regular rate and rhythm, S1, S2 normal, no murmur, click, rub or gallop GI: soft, non-tender; bowel sounds normal; no masses,  no organomegaly Extremities: extremities normal, atraumatic, no cyanosis or edema Pulses: 2+ and symmetric Skin: Skin color, texture, turgor normal. No rashes or lesions Neurologic: Grossly normal Incision/Wound:  left hip wound clean and intact   Assessment/Plan: 1. Functional deficits secondary to left intertrochanteric hip fracture which require 3+ hours per day of interdisciplinary therapy in a comprehensive inpatient rehab setting. Physiatrist is providing close team supervision and 24 hour management of active medical problems listed below. Physiatrist and rehab team continue to assess barriers to discharge/monitor patient progress toward functional and medical goals  Finalize d/c  planning for tomorrow. Mobility: Bed Mobility Bed Mobility: Yes Supine to Sit: 5: Supervision Transfers Transfers: Yes Stand Pivot Transfers: 5: Public librarian Mobility: Yes Wheelchair Assistance: 5: Supervision Wheelchair Propulsion: Both upper extremities ADL:   Cognition:      2. Anticoagulation/DVT prophylaxis with Pharmaceutical: Lovenox/ TEDS  3. Pain Management: prn and scheduled oxycodone, tylenol, robaxin, ultram.  Tolerance improving  Patient Active Hospital Problem List: Closed intertrochanteric fracture of right hip (01/02/2011)    POA: Yes   Assessment:    Plan: continue rehab. Team conf today. Wound clean Acute blood loss anemia (01/02/2011)    POA: No   Assessment:  Dietary supplementation.  Hgb trending up. Recheck Monday.   Plan:  Orthostasis (01/02/2011)    POA: No   Assessment: improved.   Plan: .  Increased midodrine. Timing of med changed.  TEDS/ Abd binder. SBP in 100's.  I'm very pleased with her progress here, and the patient is as well.  Will need to continue in the long term. Low serum cortisol level   Plan:   Reduced prednisone to 5mg  qd.  F/u as out pt ONC: Tolerating xrt so far.  2 more treatments left.  Last one is tomorrow at 1000   10  [name] T

## 2011-01-12 ENCOUNTER — Encounter: Payer: Self-pay | Admitting: Radiation Oncology

## 2011-01-12 ENCOUNTER — Ambulatory Visit
Admission: RE | Admit: 2011-01-12 | Discharge: 2011-01-12 | Disposition: A | Discharge status: Home / Self Care | Payer: Medicare Other | Source: Ambulatory Visit | Attending: Radiation Oncology | Admitting: Radiation Oncology

## 2011-01-12 MED ORDER — INFLUENZA VIRUS VACC SPLIT PF IM SUSP: 0.5000 mL | INTRAMUSCULAR | Status: DC | PRN

## 2011-01-12 MED ORDER — INFLUENZA VIRUS VACC SPLIT PF IM SUSP
0.5000 mL | INTRAMUSCULAR | Status: DC | PRN
  Filled 2011-01-12: qty 0.5

## 2011-01-12 NOTE — Progress Notes (Signed)
          Subjective/Complaints: Left wrist pain better.  Blood pressures are stabilizing. Ready to go home! Review of Systems  Constitutional: Negative.   Respiratory: Negative.   Cardiovascular: Negative.   Musculoskeletal: Positive for joint pain.  Skin: Negative.    Objective: Vital Signs: Blood pressure 116/52, pulse 82 temperature 98.5 F (36.9 C), temperature source Oral, resp. rate 15, height 5\' 7"  (1.702 m), weight 64.43 kg (142 lb 0.7 oz), SpO2 94.00%.    Physical Exam: General appearance: alert Head: Normocephalic, without obvious abnormality, atraumatic Eyes: conjunctivae/corneas clear. PERRL, EOM's intact. Fundi benign. Ears: normal TM's and external ear canals both ears Nose: Nares normal. Septum midline. Mucosa normal. No drainage or sinus tenderness. Throat: lips, mucosa, and tongue normal; teeth and gums normal Neck: no adenopathy, no carotid bruit, no JVD, supple, symmetrical, trachea midline and thyroid not enlarged, symmetric, no tenderness/mass/nodules Back: symmetric, no curvature. ROM normal. No CVA tenderness. Resp: clear to auscultation bilaterally Cardio: regular rate and rhythm, S1, S2 normal, no murmur, click, rub or gallop GI: soft, non-tender; bowel sounds normal; no masses,  no organomegaly Extremities: extremities normal, atraumatic, no cyanosis or edema Pulses: 2+ and symmetric Skin: Skin color, texture, turgor normal. No rashes or lesions Neurologic: Grossly normal Incision/Wound:  left hip wound clean and intact   Assessment/Plan: 1. Functional deficits secondary to left intertrochanteric hip fracture which require 3+ hours per day of interdisciplinary therapy in a comprehensive inpatient rehab setting. Physiatrist is providing close team supervision and 24 hour management of active medical problems listed below. Physiatrist and rehab team continue to assess barriers to discharge/monitor patient progress toward functional and medical  goals  D/C HOME TODAY! Mobility: Bed Mobility Bed Mobility: Yes Supine to Sit: 5: Supervision Transfers Transfers: Yes Stand Pivot Transfers: 5: Public librarian Mobility: Yes Wheelchair Assistance: 5: Supervision Wheelchair Propulsion: Both upper extremities ADL:   Cognition:      2. Anticoagulation/DVT prophylaxis with Pharmaceutical: Lovenox/ TEDS  3. Pain Management: prn and scheduled oxycodone, tylenol, robaxin, ultram.  Tolerance improving  Patient Active Hospital Problem List: Closed intertrochanteric fracture of right hip (01/02/2011)    POA: Yes   Assessment:    Plan: continue rehab. Team conf today. Wound clean Acute blood loss anemia (01/02/2011)    POA: No   Assessment:  Dietary supplementation.  Hgb trending up. Recheck Monday.   Plan:  Orthostasis (01/02/2011)    POA: No   Assessment: improved.   Plan: .  Increased midodrine. Timing of med changed.  TEDS/ Abd binder. SBP in 100's.  I'm very pleased with her progress here, and the patient is as well.  Will need to continue in the long term.  She'll f/u with her pcp in this regard.  Low serum cortisol level   Plan:   Reduced prednisone to 5mg  qd.  F/u as out pt ONC: Tolerating xrt. Last treatement is today.  She'll go home afterwards.   10  , T

## 2011-01-12 NOTE — Discharge Summary (Signed)
  Discharge summary [phone] dictated. MRN# [id number] OZH#[id number]

## 2011-01-12 NOTE — Progress Notes (Signed)
[facility] Radiation Oncology  Name: [name] MRN: [id number]  Date: 01/12/2011  DOB: 03-Mar-1934  Status:outpatient    CC: Julian Hy, MD  No ref. provider found   REFERRING PHYSICIAN:  No ref. provider found      DIAGNOSIS: There were no encounter diagnoses. No matching staging information was found for the patient.   INDICATION FOR TREATMENT: Curative   TREATMENT DATES: 11/18/2010 through 01/12/2011   SITE/DOSE: Right breast 4600 cGy in 23 sessions. Right breast boost 1400 cGy 7 sessions   BEAMS/ENERGY: Mixed 6 MV/10 MV photons tangential fields to the right breast. 15 MEV electron, right breast boost.   NARRATIVE: The patient tolerated her radiation therapy well, however she fell off the treatment table before the staff could assist her resulting in a hip fracture. She underwent surgery and postoperative rehabilitation as an inpatient at [facility].   PLAN: Routine followup in one month. Patient instructed to call if questions or worsening complaints in interim.

## 2011-01-12 NOTE — Progress Notes (Signed)
Pt discharged home with husband. Discharge instructions discussed with family by Marissa Nestle, PA. Pt verbalized understanding of information presented,  had no additional questions at time of discharge.  Pt escorted off unit with personal belonging  in wheelchair by [place], NT.

## 2011-01-12 NOTE — Procedures (Signed)
Ms [facility] underwent simulation verification on 11/17/2010 for treatment to her right breast.  Her isocenter was in good position and the multileaf collimators contoured the treatment volume appropriately.    ______________________________ Maryln Gottron, M.D. RJM/MEDQ  D:  01/12/2011  T:  01/12/2011  Job:  [id number]

## 2011-01-12 NOTE — Discharge Summary (Signed)
[name], [name] NO.:  [id number]  MEDICAL RECORD NO.:  [id number]  LOCATION:  4030                         FACILITY:  [facility]  PHYSICIAN:  Ranelle Oyster, M.D.DATE OF BIRTH:  Apr 24, 1934  DATE OF ADMISSION:  12/25/2010 DATE OF DISCHARGE:  01/12/2011                              DISCHARGE SUMMARY   DISCHARGE DIAGNOSES: 1. Left hip intertrochanteric fracture with intramedullary nailing. 2. Breast cancer. 3. Fibromyalgia. 4. Irritable bowel syndrome. 5. Acute blood loss anemia. 6. Orthostasis with history of recurrent syncope. 7. Question of adrenal insufficiency. 8. Hypokalemia resolved.  HISTORY OF PRESENT ILLNESS:  [name] is a 76 year old female with history of fibromyalgia, IBS, recurrent syncope as well as breast cancer, undergoing XRT.  The patient fell off radiation therapy table at [facility] on October 19 and sustained a left intertrochanteric hip fracture.  She underwent left IM nailing on October 19 by Dr. Shelle Iron and on postop is touchdown weightbearing, as well as on subcu Lovenox for DVT prophylaxis.  On October 21, the patient with drop in her hemoglobin to 7.9 due to acute blood loss anemia requiring 2 units of packed red blood cells.  She also developed hypotension with progressive confusion requiring pressors on October 22. Critical Care Medicine was consulted for management of shock due to volume depletion as well as question of adrenal insufficiency. Cardiology was consulted for input and felt that the patient's recurrent syncope due to orthostatic hypotension and likely due to volume depletion due to her history of large volume diarrhea associated with irritable bowel syndrome.  The patient has had issues with hypokalemia with potassium dropping from 2.9 to 2.7 and supplementation is ongoing. Thrombocytopenia has been resolving with platelets at nadir of 78 up to 135 today.  The patient has had issues with congestion  with productive cough with suspicion of bronchitis and was started on Avelox today.  The patient was evaluated by rehab team and we felt that she would benefit from a CIR program.  PAST MEDICAL HISTORY:  Significant for breast cancer with lumpectomy, sentinel node biopsy in August 2012 with status post chemo and now with ongoing XRT, history of IBS, fibromyalgia, history of recurrent syncope, obstructive sleep apnea, and peptic ulcer disease.  REVIEW OF SYSTEMS:  Positive for diarrhea.  Positive for congestive cough as well as pain in left hip especially with activity.  No shortness of breath, chest pain, or abdominal pain.  PAST SURGICAL HISTORY:  L4-L5 decompression and lam in 2005, TAH and BSO and moderate hiatal hernia.  FAMILY HISTORY:  Positive for COPD and cancer.  SOCIAL HISTORY:  The patient is married, was independent prior to admission.  Lives in 1 level home with 4 steps at entry.  Quit tobacco 23 years ago.  Has 2-4 ounces of wine per week.  FUNCTIONAL HISTORY:  The patient was independent prior to admission but sedentary due to ongoing radiation therapy.  She has just completed home health physical therapy prior to admission.  Functional status, the patient is at mod assist for bed mobility, mod assist transfers, mod assist for upper body care, max assist lower body dressing.  PHYSICAL EXAMINATION:  VITAL SIGNS:  Blood pressure 150/67,  pulse 76, respirations 20, temperature 97.8. GENERAL:  The patient is well-developed, well-nourished female in no acute distress.  HEENT:  Eyes anicteric, noninjected.  Oral mucosa is pink and moist with good dentition.  Nares patent. NECK:  Supple without JVD or lymphadenopathy. LUNGS:  Clear to auscultation bilaterally with good respiratory effort. CARDIAC:  Heart shows regular rate and rhythm.  No murmurs, gallops, or rubs.  ABDOMEN:  Soft, nontender.  Positive bowel sounds. EXTREMITIES:  The patient with 1+ pitting edema in  bilateral lower extremity pretibially.  Left hip incision is clean, dry, and intact. NEUROLOGIC:  Memory, mood, affect and orientation intact.  Sensation, the patient with paresthesia tips of fingers and tingling with rubbing and light touch on toes.  Motor strength is 5/5 bilateral deltoids, biceps, triceps, and grip.  She is 1/5 left hip flexure, quad, 4/5 at TA and gastroc, 4/5 throughout right lower extremity.  Cranial nerves II through XII intact.  HOSPITAL COURSE:  Ms. Chaunta Bejarano was admitted to rehab on December 26, 2010, for inpatient therapies to consist of PT, OT at least 3 hours 5 days a week.  Past admission, physiatrist, rehab, RN, and therapy team have worked together to provide customized collaborative interdisciplinary care.  The patient was maintained on subcu Lovenox for DVT prophylaxis throughout her stay.  Routine labs were done during this stay.  The patient with hyperkalemia requiring increased supplementation initially with rise in a potassium to 5.4.  Her potassium supplement was decreased to daily.  Last check of labs of November 12 revealed sodium 135, potassium 3.9, chloride 98, CO2 28, BUN 20, creatinine 0.78, glucose 90.  Routine check of CBC has been done as the patient on subcu Lovenox.  Her H and H have been stable with slowing steady improvement. CBC from November 12 revealed hemoglobin 11.3, hematocrit 36.0, white count 6.0, platelets 176.  The patient's hip incision has been monitored along.  This is noted to be healing well without any signs or symptoms of infection.  The patient's blood pressures have been checked on b.i.d. basis.  Initially, the patient with complaints of dizziness due to orthostasis.  TEDs and abdominal binder were ordered. Additionally midodrine was ordered to help with her orthostasis.  Blood pressures at time of discharge are running from 80s to 120s systolics 50s to 70s diastolic.  Her overall symptoms of dizziness and  lightheadedness have greatly improved.  The patient had issues with pain management initially.  Rehab RN has worked with therapy team by premedicating the patient prior to her a.m. and p.m. therapies with oxycodone.  n.  Ultram used additionally on prn basis for pain management.  The patient is educated on tapering pain meds past discharge.   During patient's stay in rehab, weekly team conferences were held to monitor patient's progress, set goals, as well as discuss barriers to discharge.  At the time of admission, the patient with limited standing tolerance and problems with orthostasis with positional changes.  She is able to tolerate 20 seconds of standing.  She was at supervision for bed mobility.  Supervision for stand pivot transfers.  Able to navigate wheelchair with supervision.  Initially ambulation not tested due to orthostasis.  OT has focused with emphasis on increasing activity tolerance, dynamic standing balance as well as bathing and dressing in sit to stand position.  The patient has made good progress in this stay as her blood pressures have stabilized out.  By the time of discharge, the patient was supervision level  for bathing and dressing.  Husband was educated regarding assistance needed to complete ADL tasks and he is a profession with this.  The patient is currently at supervision level for transfers, supervision level for ambulating greater than 160 feet with supervision in a controlled environment.  Family education was done with husband who is able to provide assistance as well as cues needed for safety.  Ramp has been built for home entry.  The patient is discharged to home at modified independent at wheelchair level.  Supervision for gait, short distances with left platform rolling walker.  Further followup home health PT to continue by advanced home care.  On January 12, 2011, the patient is discharged to home.  DISCHARGE MEDICATIONS: 1. Mag-Ox 400 mg  p.o. b.i.d. 2. Robaxin 500 mg p.o. q.6 h. p.r.n. spasms. 3. Midodrine 5 mg p.o. at 7:00 am and at noon daily. 4. OxyIR 10 mg 1 p.o. q.6 h. p.r.n. moderate-to-severe pain #60 Rx. 5. Niferex 150 mg p.o. b.i.d. 6. K-Dur 20 mEq p.o. per day. 7. Ultram 50 mg p.o. q.i.d. p.r.n. pain. 8. Prednisone 5 mg p.o. per day. 9. Tessalon Perles 100 mg p.o. per day. 10.Cymbalta 30 mg p.o. per day. 11.Vitamin D 16109 units p.o. per day. 12.Vytorin 10-20 p.o. per day. 13.Gabapentin 200-300 mg p.o. t.i.d. 14.Levothyroxine 88 mcg p.o. per day. 15.Ativan 0.5 mg p.o. q.8 h. p.r.n. anxiety. 16.Protonix 40 mg p.o. per day. 17.Prenatal vitamin 1 per day. 18.Ambien 10 mg p.o. at bedtime p.r.n. insomnia.  DIET:  Regular.  ACTIVITY LEVEL:  A 24-hour supervision up with assistance.  Touchdown weightbearing on left lower extremity.  FOLLOWUP:  The patient to follow up with Dr. Shelle Iron in the next 10-14 days for postop check.  Follow up with Dr. [name] in 2 weeks for routine check.  Follow up with Dr. Hermelinda Medicus as needed.     Delle Reining, P.A.   ______________________________ Ranelle Oyster, M.D.    PL/MEDQ  D:  01/12/2011  T:  01/12/2011  Job:  [id number]  cc:   Jene Every, M.D. [employer]. [name], M.D.

## 2011-01-14 DIAGNOSIS — I951 Orthostatic hypotension: Secondary | ICD-10-CM | POA: Diagnosis not present

## 2011-01-14 DIAGNOSIS — G609 Hereditary and idiopathic neuropathy, unspecified: Secondary | ICD-10-CM | POA: Diagnosis not present

## 2011-01-14 DIAGNOSIS — S72009D Fracture of unspecified part of neck of unspecified femur, subsequent encounter for closed fracture with routine healing: Secondary | ICD-10-CM | POA: Diagnosis not present

## 2011-01-14 DIAGNOSIS — K589 Irritable bowel syndrome without diarrhea: Secondary | ICD-10-CM | POA: Diagnosis not present

## 2011-01-14 DIAGNOSIS — C50919 Malignant neoplasm of unspecified site of unspecified female breast: Secondary | ICD-10-CM | POA: Diagnosis not present

## 2011-01-14 DIAGNOSIS — IMO0001 Reserved for inherently not codable concepts without codable children: Secondary | ICD-10-CM | POA: Diagnosis not present

## 2011-01-16 DIAGNOSIS — IMO0001 Reserved for inherently not codable concepts without codable children: Secondary | ICD-10-CM | POA: Diagnosis not present

## 2011-01-16 DIAGNOSIS — C50919 Malignant neoplasm of unspecified site of unspecified female breast: Secondary | ICD-10-CM | POA: Diagnosis not present

## 2011-01-16 DIAGNOSIS — G609 Hereditary and idiopathic neuropathy, unspecified: Secondary | ICD-10-CM | POA: Diagnosis not present

## 2011-01-16 DIAGNOSIS — I951 Orthostatic hypotension: Secondary | ICD-10-CM | POA: Diagnosis not present

## 2011-01-16 DIAGNOSIS — K589 Irritable bowel syndrome without diarrhea: Secondary | ICD-10-CM | POA: Diagnosis not present

## 2011-01-16 DIAGNOSIS — S72009D Fracture of unspecified part of neck of unspecified femur, subsequent encounter for closed fracture with routine healing: Secondary | ICD-10-CM | POA: Diagnosis not present

## 2011-01-18 DIAGNOSIS — S72009D Fracture of unspecified part of neck of unspecified femur, subsequent encounter for closed fracture with routine healing: Secondary | ICD-10-CM | POA: Diagnosis not present

## 2011-01-18 DIAGNOSIS — K589 Irritable bowel syndrome without diarrhea: Secondary | ICD-10-CM | POA: Diagnosis not present

## 2011-01-18 DIAGNOSIS — IMO0001 Reserved for inherently not codable concepts without codable children: Secondary | ICD-10-CM | POA: Diagnosis not present

## 2011-01-18 DIAGNOSIS — G609 Hereditary and idiopathic neuropathy, unspecified: Secondary | ICD-10-CM | POA: Diagnosis not present

## 2011-01-18 DIAGNOSIS — C50919 Malignant neoplasm of unspecified site of unspecified female breast: Secondary | ICD-10-CM | POA: Diagnosis not present

## 2011-01-18 DIAGNOSIS — I951 Orthostatic hypotension: Secondary | ICD-10-CM | POA: Diagnosis not present

## 2011-01-20 DIAGNOSIS — G609 Hereditary and idiopathic neuropathy, unspecified: Secondary | ICD-10-CM | POA: Diagnosis not present

## 2011-01-20 DIAGNOSIS — S72009D Fracture of unspecified part of neck of unspecified femur, subsequent encounter for closed fracture with routine healing: Secondary | ICD-10-CM | POA: Diagnosis not present

## 2011-01-20 DIAGNOSIS — K589 Irritable bowel syndrome without diarrhea: Secondary | ICD-10-CM | POA: Diagnosis not present

## 2011-01-20 DIAGNOSIS — IMO0001 Reserved for inherently not codable concepts without codable children: Secondary | ICD-10-CM | POA: Diagnosis not present

## 2011-01-20 DIAGNOSIS — I951 Orthostatic hypotension: Secondary | ICD-10-CM | POA: Diagnosis not present

## 2011-01-20 DIAGNOSIS — C50919 Malignant neoplasm of unspecified site of unspecified female breast: Secondary | ICD-10-CM | POA: Diagnosis not present

## 2011-01-21 DIAGNOSIS — I951 Orthostatic hypotension: Secondary | ICD-10-CM | POA: Diagnosis not present

## 2011-01-21 DIAGNOSIS — K589 Irritable bowel syndrome without diarrhea: Secondary | ICD-10-CM | POA: Diagnosis not present

## 2011-01-21 DIAGNOSIS — IMO0001 Reserved for inherently not codable concepts without codable children: Secondary | ICD-10-CM | POA: Diagnosis not present

## 2011-01-21 DIAGNOSIS — C50919 Malignant neoplasm of unspecified site of unspecified female breast: Secondary | ICD-10-CM | POA: Diagnosis not present

## 2011-01-21 DIAGNOSIS — S72009D Fracture of unspecified part of neck of unspecified femur, subsequent encounter for closed fracture with routine healing: Secondary | ICD-10-CM | POA: Diagnosis not present

## 2011-01-21 DIAGNOSIS — G609 Hereditary and idiopathic neuropathy, unspecified: Secondary | ICD-10-CM | POA: Diagnosis not present

## 2011-01-26 DIAGNOSIS — I951 Orthostatic hypotension: Secondary | ICD-10-CM | POA: Diagnosis not present

## 2011-01-26 DIAGNOSIS — K589 Irritable bowel syndrome without diarrhea: Secondary | ICD-10-CM | POA: Diagnosis not present

## 2011-01-26 DIAGNOSIS — G609 Hereditary and idiopathic neuropathy, unspecified: Secondary | ICD-10-CM | POA: Diagnosis not present

## 2011-01-26 DIAGNOSIS — C50919 Malignant neoplasm of unspecified site of unspecified female breast: Secondary | ICD-10-CM | POA: Diagnosis not present

## 2011-01-26 DIAGNOSIS — S72009D Fracture of unspecified part of neck of unspecified femur, subsequent encounter for closed fracture with routine healing: Secondary | ICD-10-CM | POA: Diagnosis not present

## 2011-01-26 DIAGNOSIS — IMO0001 Reserved for inherently not codable concepts without codable children: Secondary | ICD-10-CM | POA: Diagnosis not present

## 2011-01-27 ENCOUNTER — Telehealth: Payer: Self-pay | Admitting: *Deleted

## 2011-01-27 DIAGNOSIS — I951 Orthostatic hypotension: Secondary | ICD-10-CM | POA: Diagnosis not present

## 2011-01-27 DIAGNOSIS — K589 Irritable bowel syndrome without diarrhea: Secondary | ICD-10-CM | POA: Diagnosis not present

## 2011-01-27 DIAGNOSIS — IMO0001 Reserved for inherently not codable concepts without codable children: Secondary | ICD-10-CM | POA: Diagnosis not present

## 2011-01-27 DIAGNOSIS — G609 Hereditary and idiopathic neuropathy, unspecified: Secondary | ICD-10-CM | POA: Diagnosis not present

## 2011-01-27 DIAGNOSIS — S72009D Fracture of unspecified part of neck of unspecified femur, subsequent encounter for closed fracture with routine healing: Secondary | ICD-10-CM | POA: Diagnosis not present

## 2011-01-27 DIAGNOSIS — C50919 Malignant neoplasm of unspecified site of unspecified female breast: Secondary | ICD-10-CM | POA: Diagnosis not present

## 2011-01-27 NOTE — Telephone Encounter (Signed)
return patient call gave patient appointment for 02-09-2011 to see dr.rubin for follow up after patient has finished radiation treatments

## 2011-01-28 ENCOUNTER — Ambulatory Visit: Payer: Medicare Other | Admitting: Pulmonary Disease

## 2011-01-28 DIAGNOSIS — IMO0001 Reserved for inherently not codable concepts without codable children: Secondary | ICD-10-CM | POA: Diagnosis not present

## 2011-01-28 DIAGNOSIS — G609 Hereditary and idiopathic neuropathy, unspecified: Secondary | ICD-10-CM | POA: Diagnosis not present

## 2011-01-28 DIAGNOSIS — K589 Irritable bowel syndrome without diarrhea: Secondary | ICD-10-CM | POA: Diagnosis not present

## 2011-01-28 DIAGNOSIS — S72009D Fracture of unspecified part of neck of unspecified femur, subsequent encounter for closed fracture with routine healing: Secondary | ICD-10-CM | POA: Diagnosis not present

## 2011-01-28 DIAGNOSIS — I951 Orthostatic hypotension: Secondary | ICD-10-CM | POA: Diagnosis not present

## 2011-01-28 DIAGNOSIS — C50919 Malignant neoplasm of unspecified site of unspecified female breast: Secondary | ICD-10-CM | POA: Diagnosis not present

## 2011-01-29 DIAGNOSIS — S72009D Fracture of unspecified part of neck of unspecified femur, subsequent encounter for closed fracture with routine healing: Secondary | ICD-10-CM | POA: Diagnosis not present

## 2011-01-29 DIAGNOSIS — C50919 Malignant neoplasm of unspecified site of unspecified female breast: Secondary | ICD-10-CM | POA: Diagnosis not present

## 2011-01-29 DIAGNOSIS — IMO0001 Reserved for inherently not codable concepts without codable children: Secondary | ICD-10-CM | POA: Diagnosis not present

## 2011-01-29 DIAGNOSIS — I951 Orthostatic hypotension: Secondary | ICD-10-CM | POA: Diagnosis not present

## 2011-01-29 DIAGNOSIS — G609 Hereditary and idiopathic neuropathy, unspecified: Secondary | ICD-10-CM | POA: Diagnosis not present

## 2011-01-29 DIAGNOSIS — K589 Irritable bowel syndrome without diarrhea: Secondary | ICD-10-CM | POA: Diagnosis not present

## 2011-02-01 DIAGNOSIS — G609 Hereditary and idiopathic neuropathy, unspecified: Secondary | ICD-10-CM | POA: Diagnosis not present

## 2011-02-01 DIAGNOSIS — K589 Irritable bowel syndrome without diarrhea: Secondary | ICD-10-CM | POA: Diagnosis not present

## 2011-02-01 DIAGNOSIS — I951 Orthostatic hypotension: Secondary | ICD-10-CM | POA: Diagnosis not present

## 2011-02-01 DIAGNOSIS — S72009D Fracture of unspecified part of neck of unspecified femur, subsequent encounter for closed fracture with routine healing: Secondary | ICD-10-CM | POA: Diagnosis not present

## 2011-02-01 DIAGNOSIS — IMO0001 Reserved for inherently not codable concepts without codable children: Secondary | ICD-10-CM | POA: Diagnosis not present

## 2011-02-01 DIAGNOSIS — C50919 Malignant neoplasm of unspecified site of unspecified female breast: Secondary | ICD-10-CM | POA: Diagnosis not present

## 2011-02-02 DIAGNOSIS — I951 Orthostatic hypotension: Secondary | ICD-10-CM | POA: Diagnosis not present

## 2011-02-02 DIAGNOSIS — C50919 Malignant neoplasm of unspecified site of unspecified female breast: Secondary | ICD-10-CM | POA: Diagnosis not present

## 2011-02-02 DIAGNOSIS — G609 Hereditary and idiopathic neuropathy, unspecified: Secondary | ICD-10-CM | POA: Diagnosis not present

## 2011-02-02 DIAGNOSIS — IMO0001 Reserved for inherently not codable concepts without codable children: Secondary | ICD-10-CM | POA: Diagnosis not present

## 2011-02-02 DIAGNOSIS — K589 Irritable bowel syndrome without diarrhea: Secondary | ICD-10-CM | POA: Diagnosis not present

## 2011-02-02 DIAGNOSIS — S72009D Fracture of unspecified part of neck of unspecified femur, subsequent encounter for closed fracture with routine healing: Secondary | ICD-10-CM | POA: Diagnosis not present

## 2011-02-03 DIAGNOSIS — G609 Hereditary and idiopathic neuropathy, unspecified: Secondary | ICD-10-CM | POA: Diagnosis not present

## 2011-02-03 DIAGNOSIS — IMO0001 Reserved for inherently not codable concepts without codable children: Secondary | ICD-10-CM | POA: Diagnosis not present

## 2011-02-03 DIAGNOSIS — C50919 Malignant neoplasm of unspecified site of unspecified female breast: Secondary | ICD-10-CM | POA: Diagnosis not present

## 2011-02-03 DIAGNOSIS — K589 Irritable bowel syndrome without diarrhea: Secondary | ICD-10-CM | POA: Diagnosis not present

## 2011-02-03 DIAGNOSIS — I951 Orthostatic hypotension: Secondary | ICD-10-CM | POA: Diagnosis not present

## 2011-02-03 DIAGNOSIS — S72009D Fracture of unspecified part of neck of unspecified femur, subsequent encounter for closed fracture with routine healing: Secondary | ICD-10-CM | POA: Diagnosis not present

## 2011-02-04 DIAGNOSIS — Z0271 Encounter for disability determination: Secondary | ICD-10-CM

## 2011-02-05 DIAGNOSIS — S72009D Fracture of unspecified part of neck of unspecified femur, subsequent encounter for closed fracture with routine healing: Secondary | ICD-10-CM | POA: Diagnosis not present

## 2011-02-05 DIAGNOSIS — G609 Hereditary and idiopathic neuropathy, unspecified: Secondary | ICD-10-CM | POA: Diagnosis not present

## 2011-02-05 DIAGNOSIS — K589 Irritable bowel syndrome without diarrhea: Secondary | ICD-10-CM | POA: Diagnosis not present

## 2011-02-05 DIAGNOSIS — I951 Orthostatic hypotension: Secondary | ICD-10-CM | POA: Diagnosis not present

## 2011-02-05 DIAGNOSIS — IMO0001 Reserved for inherently not codable concepts without codable children: Secondary | ICD-10-CM | POA: Diagnosis not present

## 2011-02-05 DIAGNOSIS — C50919 Malignant neoplasm of unspecified site of unspecified female breast: Secondary | ICD-10-CM | POA: Diagnosis not present

## 2011-02-06 DIAGNOSIS — I951 Orthostatic hypotension: Secondary | ICD-10-CM | POA: Diagnosis not present

## 2011-02-06 DIAGNOSIS — S72009D Fracture of unspecified part of neck of unspecified femur, subsequent encounter for closed fracture with routine healing: Secondary | ICD-10-CM | POA: Diagnosis not present

## 2011-02-06 DIAGNOSIS — C50919 Malignant neoplasm of unspecified site of unspecified female breast: Secondary | ICD-10-CM | POA: Diagnosis not present

## 2011-02-06 DIAGNOSIS — K589 Irritable bowel syndrome without diarrhea: Secondary | ICD-10-CM | POA: Diagnosis not present

## 2011-02-06 DIAGNOSIS — IMO0001 Reserved for inherently not codable concepts without codable children: Secondary | ICD-10-CM | POA: Diagnosis not present

## 2011-02-06 DIAGNOSIS — G609 Hereditary and idiopathic neuropathy, unspecified: Secondary | ICD-10-CM | POA: Diagnosis not present

## 2011-02-08 DIAGNOSIS — G609 Hereditary and idiopathic neuropathy, unspecified: Secondary | ICD-10-CM | POA: Diagnosis not present

## 2011-02-08 DIAGNOSIS — C50919 Malignant neoplasm of unspecified site of unspecified female breast: Secondary | ICD-10-CM | POA: Diagnosis not present

## 2011-02-08 DIAGNOSIS — S72009D Fracture of unspecified part of neck of unspecified femur, subsequent encounter for closed fracture with routine healing: Secondary | ICD-10-CM | POA: Diagnosis not present

## 2011-02-08 DIAGNOSIS — K589 Irritable bowel syndrome without diarrhea: Secondary | ICD-10-CM | POA: Diagnosis not present

## 2011-02-08 DIAGNOSIS — I951 Orthostatic hypotension: Secondary | ICD-10-CM | POA: Diagnosis not present

## 2011-02-08 DIAGNOSIS — IMO0001 Reserved for inherently not codable concepts without codable children: Secondary | ICD-10-CM | POA: Diagnosis not present

## 2011-02-09 ENCOUNTER — Encounter: Payer: Self-pay | Admitting: Radiation Oncology

## 2011-02-09 ENCOUNTER — Ambulatory Visit
Admit: 2011-02-09 | Discharge: 2011-02-09 | Disposition: A | Discharge status: Home / Self Care | Payer: Medicare Other | Attending: Radiation Oncology | Admitting: Radiation Oncology

## 2011-02-09 ENCOUNTER — Ambulatory Visit (HOSPITAL_BASED_OUTPATIENT_CLINIC_OR_DEPARTMENT_OTHER): Payer: Medicare Other | Admitting: Oncology

## 2011-02-09 VITALS — BP 137/68 | HR 103 | Temp 98.2°F | Resp 20 | Ht 67.5 in | Wt 140.2 lb

## 2011-02-09 VITALS — BP 146/77 | HR 82 | Temp 97.9°F | Ht 67.5 in | Wt 140.6 lb

## 2011-02-09 DIAGNOSIS — C50419 Malignant neoplasm of upper-outer quadrant of unspecified female breast: Secondary | ICD-10-CM

## 2011-02-09 DIAGNOSIS — M79609 Pain in unspecified limb: Secondary | ICD-10-CM

## 2011-02-09 DIAGNOSIS — Z171 Estrogen receptor negative status [ER-]: Secondary | ICD-10-CM

## 2011-02-09 DIAGNOSIS — C50919 Malignant neoplasm of unspecified site of unspecified female breast: Secondary | ICD-10-CM

## 2011-02-09 NOTE — Progress Notes (Signed)
Hematology and Oncology Follow Up Visit  [name] [facility] [id number] 10-30-1934 76 y.o. 02/09/2011 1:52 PM   Principle Diagnosis: 76 yo with hx of TN breast cancer diagnosed 11/2009, s/p FEC x4 followed by abraxane q week x3/4 ;total of 4 cycles, s/p surgery 09/2010 with residual dcis.; s/p xrtr rt breast completed 12/2010. Complicated by fall and fractured femur (lt), s/p orif. Hx dcis lt breast 1996  Interim History:  Since being seen last the patient is doing well. She has completed rehabilitation and is working at home because of the fractured left femur. There is ongoing pain in her femur. She may require more surgery for this. She has been taking Cymbalta 20 mg a day as well as Neurontin to 1800 mg a day for the neuropathy. This is actually much better and she does not have any neuropathy at this point but is having discomfort and some numbness which he thinks might be slightly worse. Aside from [place] have reviewed the patient's current medications.edication reviewed/display:[id number]}  Allergies:  Allergies  Allergen Reactions  . Penicillins Anaphylaxis  . Adhesive (Tape) Other (See Comments)    Blisters   . Doxycycline Nausea And Vomiting    Past Medical History, Surgical history, Social history, and Family History were reviewed and updated.  Review of Systems: Constitutional:  Negative for fever, chills, night sweats, anorexia, weight loss, pain. Cardiovascular: no chest pain or dyspnea on exertion Respiratory: no cough, shortness of breath, or wheezing Neurological: no TIA or stroke symptoms Dermatological: negative ENT: negative Skin Gastrointestinal: no abdominal pain, change in bowel habits, or black or bloody stools Genito-Urinary: no dysuria, trouble voiding, or hematuria Hematological and Lymphatic: negative Breast: negative for breast lumps Musculoskeletal: positive for - pain in hip - left, upper Remaining ROS negative.  Physical Exam: Blood pressure 146/77, pulse  82, temperature 97.9 F (36.6 C), temperature source Oral, height 5' 7.5" (1.715 m), weight 140 lb 9.6 oz (63.776 kg). ECOG:  General appearance: alert, cooperative and appears stated age Head: Normocephalic, without obvious abnormality, atraumatic Neck: no adenopathy, no carotid bruit, no JVD, supple, symmetrical, trachea midline and thyroid not enlarged, symmetric, no tenderness/mass/nodules Lymph nodes: Cervical, supraclavicular, and axillary nodes normal. Cardiac : nl Pulmonary: Normal Breasts: Right breast status post lumpectomy and radiation healing well residual pigmentation changes. Left breast and axilla normal Abdomen: Normal Extremities status post left action or stiffness and decreased ambulation as a result normal Neuro:  Lab Results: Lab Results  Component Value Date   WBC 6.0 01/11/2011   HGB 11.3* 01/11/2011   HCT 36.0 01/11/2011   MCV 96.3 01/11/2011   PLT 176 01/11/2011     Chemistry      Component Value Date/Time   NA 135 01/11/2011 0938   K 3.9 01/11/2011 0938   CL 98 01/11/2011 0938   CO2 28 01/11/2011 0938   BUN 20 01/11/2011 0938   CREATININE 0.78 01/11/2011 0938      Component Value Date/Time   CALCIUM 9.3 01/11/2011 0938   ALKPHOS 144* 01/05/2011 0635   AST 17 01/05/2011 0635   ALT 22 01/05/2011 0635   BILITOT 0.4 01/05/2011 4098       Radiological Studies: chest X-ray NA Mammogram Schedule of the right mammogram in spring Bone density na  Impression and Plan: Patient is doing well considering her recent experience. She'll be seen in approximately 3 months. A mammogram of the right breast prior to her being seen as well as regular blood work. I have renewed her Cymbalta and  Neurontin and have recommended that she try to wean her Neurontin by 300 mg a day every 3-4 days.  More than 50% of the visit was spent in patient-related counselling   Pierce Crane, MD 12/11/20121:52 PM

## 2011-02-09 NOTE — Progress Notes (Signed)
Pt cont to recover from fall . PT 3 x/wk, splint on L wrist, using walker.  She has no problems w/R breast, states she is healed.

## 2011-02-09 NOTE — Progress Notes (Signed)
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 She is without complaints today. She is continuing her rehabilitation for her hip fracture. She also has a left wrist brain for which she wears a splint. She is scheduled to see Dr. Donnie Coffin later today.  The physical examination: Nodes: Without palpable cervical, supraclavicular, or axillary lymphadenopathy. Chest: Lungs clear. Breasts: There is residual hyperpigmentation of the skin with patchy dry desquamation along the right breast. No masses are appreciated. Left breast without masses or lesions. Extremities without edema.  Impression: Satisfactory progress.  Plan: She'll see Dr. Donnie Coffin today for medical oncology followup. I defer to Dr. Donnie Coffin and Dr. Jamey Ripa as to the scheduling of her posttreatment mammography. She'll maintain her oncology followup through Dr. Jamey Ripa in Dr. Donnie Coffin.

## 2011-02-10 ENCOUNTER — Other Ambulatory Visit: Payer: Self-pay | Admitting: Oncology

## 2011-02-10 DIAGNOSIS — C50919 Malignant neoplasm of unspecified site of unspecified female breast: Secondary | ICD-10-CM

## 2011-02-11 DIAGNOSIS — C50919 Malignant neoplasm of unspecified site of unspecified female breast: Secondary | ICD-10-CM | POA: Diagnosis not present

## 2011-02-11 DIAGNOSIS — I951 Orthostatic hypotension: Secondary | ICD-10-CM | POA: Diagnosis not present

## 2011-02-11 DIAGNOSIS — IMO0001 Reserved for inherently not codable concepts without codable children: Secondary | ICD-10-CM | POA: Diagnosis not present

## 2011-02-11 DIAGNOSIS — G609 Hereditary and idiopathic neuropathy, unspecified: Secondary | ICD-10-CM | POA: Diagnosis not present

## 2011-02-11 DIAGNOSIS — K589 Irritable bowel syndrome without diarrhea: Secondary | ICD-10-CM | POA: Diagnosis not present

## 2011-02-11 DIAGNOSIS — S72009D Fracture of unspecified part of neck of unspecified femur, subsequent encounter for closed fracture with routine healing: Secondary | ICD-10-CM | POA: Diagnosis not present

## 2011-02-13 DIAGNOSIS — S72009D Fracture of unspecified part of neck of unspecified femur, subsequent encounter for closed fracture with routine healing: Secondary | ICD-10-CM | POA: Diagnosis not present

## 2011-02-13 DIAGNOSIS — G609 Hereditary and idiopathic neuropathy, unspecified: Secondary | ICD-10-CM | POA: Diagnosis not present

## 2011-02-13 DIAGNOSIS — K589 Irritable bowel syndrome without diarrhea: Secondary | ICD-10-CM | POA: Diagnosis not present

## 2011-02-13 DIAGNOSIS — I951 Orthostatic hypotension: Secondary | ICD-10-CM | POA: Diagnosis not present

## 2011-02-13 DIAGNOSIS — C50919 Malignant neoplasm of unspecified site of unspecified female breast: Secondary | ICD-10-CM | POA: Diagnosis not present

## 2011-02-13 DIAGNOSIS — IMO0001 Reserved for inherently not codable concepts without codable children: Secondary | ICD-10-CM | POA: Diagnosis not present

## 2011-02-17 DIAGNOSIS — G609 Hereditary and idiopathic neuropathy, unspecified: Secondary | ICD-10-CM | POA: Diagnosis not present

## 2011-02-17 DIAGNOSIS — I951 Orthostatic hypotension: Secondary | ICD-10-CM | POA: Diagnosis not present

## 2011-02-17 DIAGNOSIS — IMO0001 Reserved for inherently not codable concepts without codable children: Secondary | ICD-10-CM | POA: Diagnosis not present

## 2011-02-17 DIAGNOSIS — K589 Irritable bowel syndrome without diarrhea: Secondary | ICD-10-CM | POA: Diagnosis not present

## 2011-02-17 DIAGNOSIS — C50919 Malignant neoplasm of unspecified site of unspecified female breast: Secondary | ICD-10-CM | POA: Diagnosis not present

## 2011-02-17 DIAGNOSIS — S72009D Fracture of unspecified part of neck of unspecified femur, subsequent encounter for closed fracture with routine healing: Secondary | ICD-10-CM | POA: Diagnosis not present

## 2011-02-18 DIAGNOSIS — K589 Irritable bowel syndrome without diarrhea: Secondary | ICD-10-CM | POA: Diagnosis not present

## 2011-02-18 DIAGNOSIS — S72009D Fracture of unspecified part of neck of unspecified femur, subsequent encounter for closed fracture with routine healing: Secondary | ICD-10-CM | POA: Diagnosis not present

## 2011-02-18 DIAGNOSIS — C50919 Malignant neoplasm of unspecified site of unspecified female breast: Secondary | ICD-10-CM | POA: Diagnosis not present

## 2011-02-18 DIAGNOSIS — IMO0001 Reserved for inherently not codable concepts without codable children: Secondary | ICD-10-CM | POA: Diagnosis not present

## 2011-02-18 DIAGNOSIS — G609 Hereditary and idiopathic neuropathy, unspecified: Secondary | ICD-10-CM | POA: Diagnosis not present

## 2011-02-18 DIAGNOSIS — I951 Orthostatic hypotension: Secondary | ICD-10-CM | POA: Diagnosis not present

## 2011-02-25 DIAGNOSIS — I951 Orthostatic hypotension: Secondary | ICD-10-CM | POA: Diagnosis not present

## 2011-02-25 DIAGNOSIS — G609 Hereditary and idiopathic neuropathy, unspecified: Secondary | ICD-10-CM | POA: Diagnosis not present

## 2011-02-25 DIAGNOSIS — C50919 Malignant neoplasm of unspecified site of unspecified female breast: Secondary | ICD-10-CM | POA: Diagnosis not present

## 2011-02-25 DIAGNOSIS — K589 Irritable bowel syndrome without diarrhea: Secondary | ICD-10-CM | POA: Diagnosis not present

## 2011-02-25 DIAGNOSIS — S72009D Fracture of unspecified part of neck of unspecified femur, subsequent encounter for closed fracture with routine healing: Secondary | ICD-10-CM | POA: Diagnosis not present

## 2011-02-25 DIAGNOSIS — IMO0001 Reserved for inherently not codable concepts without codable children: Secondary | ICD-10-CM | POA: Diagnosis not present

## 2011-02-26 DIAGNOSIS — S72009D Fracture of unspecified part of neck of unspecified femur, subsequent encounter for closed fracture with routine healing: Secondary | ICD-10-CM | POA: Diagnosis not present

## 2011-02-26 DIAGNOSIS — K589 Irritable bowel syndrome without diarrhea: Secondary | ICD-10-CM | POA: Diagnosis not present

## 2011-02-26 DIAGNOSIS — G609 Hereditary and idiopathic neuropathy, unspecified: Secondary | ICD-10-CM | POA: Diagnosis not present

## 2011-02-26 DIAGNOSIS — IMO0001 Reserved for inherently not codable concepts without codable children: Secondary | ICD-10-CM | POA: Diagnosis not present

## 2011-02-26 DIAGNOSIS — I951 Orthostatic hypotension: Secondary | ICD-10-CM | POA: Diagnosis not present

## 2011-02-26 DIAGNOSIS — C50919 Malignant neoplasm of unspecified site of unspecified female breast: Secondary | ICD-10-CM | POA: Diagnosis not present

## 2011-03-03 DIAGNOSIS — S72009D Fracture of unspecified part of neck of unspecified femur, subsequent encounter for closed fracture with routine healing: Secondary | ICD-10-CM | POA: Diagnosis not present

## 2011-03-03 DIAGNOSIS — I951 Orthostatic hypotension: Secondary | ICD-10-CM | POA: Diagnosis not present

## 2011-03-03 DIAGNOSIS — C50919 Malignant neoplasm of unspecified site of unspecified female breast: Secondary | ICD-10-CM | POA: Diagnosis not present

## 2011-03-03 DIAGNOSIS — IMO0001 Reserved for inherently not codable concepts without codable children: Secondary | ICD-10-CM | POA: Diagnosis not present

## 2011-03-03 DIAGNOSIS — K589 Irritable bowel syndrome without diarrhea: Secondary | ICD-10-CM | POA: Diagnosis not present

## 2011-03-03 DIAGNOSIS — G609 Hereditary and idiopathic neuropathy, unspecified: Secondary | ICD-10-CM | POA: Diagnosis not present

## 2011-03-05 DIAGNOSIS — C50919 Malignant neoplasm of unspecified site of unspecified female breast: Secondary | ICD-10-CM | POA: Diagnosis not present

## 2011-03-05 DIAGNOSIS — IMO0001 Reserved for inherently not codable concepts without codable children: Secondary | ICD-10-CM | POA: Diagnosis not present

## 2011-03-05 DIAGNOSIS — G609 Hereditary and idiopathic neuropathy, unspecified: Secondary | ICD-10-CM | POA: Diagnosis not present

## 2011-03-05 DIAGNOSIS — I951 Orthostatic hypotension: Secondary | ICD-10-CM | POA: Diagnosis not present

## 2011-03-05 DIAGNOSIS — S72009D Fracture of unspecified part of neck of unspecified femur, subsequent encounter for closed fracture with routine healing: Secondary | ICD-10-CM | POA: Diagnosis not present

## 2011-03-05 DIAGNOSIS — K589 Irritable bowel syndrome without diarrhea: Secondary | ICD-10-CM | POA: Diagnosis not present

## 2011-03-08 DIAGNOSIS — S63509A Unspecified sprain of unspecified wrist, initial encounter: Secondary | ICD-10-CM | POA: Diagnosis not present

## 2011-03-09 DIAGNOSIS — S72009D Fracture of unspecified part of neck of unspecified femur, subsequent encounter for closed fracture with routine healing: Secondary | ICD-10-CM | POA: Diagnosis not present

## 2011-03-09 DIAGNOSIS — C50919 Malignant neoplasm of unspecified site of unspecified female breast: Secondary | ICD-10-CM | POA: Diagnosis not present

## 2011-03-09 DIAGNOSIS — K589 Irritable bowel syndrome without diarrhea: Secondary | ICD-10-CM | POA: Diagnosis not present

## 2011-03-09 DIAGNOSIS — IMO0001 Reserved for inherently not codable concepts without codable children: Secondary | ICD-10-CM | POA: Diagnosis not present

## 2011-03-09 DIAGNOSIS — G609 Hereditary and idiopathic neuropathy, unspecified: Secondary | ICD-10-CM | POA: Diagnosis not present

## 2011-03-09 DIAGNOSIS — I951 Orthostatic hypotension: Secondary | ICD-10-CM | POA: Diagnosis not present

## 2011-03-13 DIAGNOSIS — C50919 Malignant neoplasm of unspecified site of unspecified female breast: Secondary | ICD-10-CM | POA: Diagnosis not present

## 2011-03-13 DIAGNOSIS — G609 Hereditary and idiopathic neuropathy, unspecified: Secondary | ICD-10-CM | POA: Diagnosis not present

## 2011-03-13 DIAGNOSIS — IMO0001 Reserved for inherently not codable concepts without codable children: Secondary | ICD-10-CM | POA: Diagnosis not present

## 2011-03-13 DIAGNOSIS — S72009D Fracture of unspecified part of neck of unspecified femur, subsequent encounter for closed fracture with routine healing: Secondary | ICD-10-CM | POA: Diagnosis not present

## 2011-03-13 DIAGNOSIS — I951 Orthostatic hypotension: Secondary | ICD-10-CM | POA: Diagnosis not present

## 2011-03-13 DIAGNOSIS — K589 Irritable bowel syndrome without diarrhea: Secondary | ICD-10-CM | POA: Diagnosis not present

## 2011-03-16 DIAGNOSIS — S7290XA Unspecified fracture of unspecified femur, initial encounter for closed fracture: Secondary | ICD-10-CM | POA: Diagnosis not present

## 2011-03-17 ENCOUNTER — Other Ambulatory Visit: Payer: Self-pay | Admitting: Specialist

## 2011-03-17 DIAGNOSIS — M898X5 Other specified disorders of bone, thigh: Secondary | ICD-10-CM

## 2011-03-18 ENCOUNTER — Other Ambulatory Visit: Payer: Self-pay | Admitting: Specialist

## 2011-03-18 ENCOUNTER — Ambulatory Visit
Admission: RE | Admit: 2011-03-18 | Discharge: 2011-03-18 | Disposition: A | Discharge status: Home / Self Care | Payer: Medicare Other | Source: Ambulatory Visit | Attending: Specialist | Admitting: Specialist

## 2011-03-18 DIAGNOSIS — Z853 Personal history of malignant neoplasm of breast: Secondary | ICD-10-CM | POA: Diagnosis not present

## 2011-03-18 DIAGNOSIS — M898X5 Other specified disorders of bone, thigh: Secondary | ICD-10-CM

## 2011-03-18 DIAGNOSIS — Z4789 Encounter for other orthopedic aftercare: Secondary | ICD-10-CM | POA: Diagnosis not present

## 2011-03-18 DIAGNOSIS — M79609 Pain in unspecified limb: Secondary | ICD-10-CM | POA: Diagnosis not present

## 2011-03-18 MED ORDER — IOHEXOL 300 MG/ML  SOLN
100.0000 mL | Freq: Once | INTRAMUSCULAR | Status: AC | PRN
  Administered 2011-03-18: 100 mL via INTRAVENOUS

## 2011-03-24 DIAGNOSIS — S7290XA Unspecified fracture of unspecified femur, initial encounter for closed fracture: Secondary | ICD-10-CM | POA: Diagnosis not present

## 2011-03-27 DIAGNOSIS — S7290XD Unspecified fracture of unspecified femur, subsequent encounter for closed fracture with routine healing: Secondary | ICD-10-CM | POA: Diagnosis not present

## 2011-03-27 DIAGNOSIS — R269 Unspecified abnormalities of gait and mobility: Secondary | ICD-10-CM | POA: Diagnosis not present

## 2011-03-27 DIAGNOSIS — M545 Low back pain: Secondary | ICD-10-CM | POA: Diagnosis not present

## 2011-03-27 DIAGNOSIS — K589 Irritable bowel syndrome without diarrhea: Secondary | ICD-10-CM | POA: Diagnosis not present

## 2011-03-27 DIAGNOSIS — IMO0001 Reserved for inherently not codable concepts without codable children: Secondary | ICD-10-CM | POA: Diagnosis not present

## 2011-03-29 DIAGNOSIS — M545 Low back pain: Secondary | ICD-10-CM | POA: Diagnosis not present

## 2011-03-29 DIAGNOSIS — K589 Irritable bowel syndrome without diarrhea: Secondary | ICD-10-CM | POA: Diagnosis not present

## 2011-03-29 DIAGNOSIS — IMO0001 Reserved for inherently not codable concepts without codable children: Secondary | ICD-10-CM | POA: Diagnosis not present

## 2011-03-29 DIAGNOSIS — R269 Unspecified abnormalities of gait and mobility: Secondary | ICD-10-CM | POA: Diagnosis not present

## 2011-03-29 DIAGNOSIS — S7290XD Unspecified fracture of unspecified femur, subsequent encounter for closed fracture with routine healing: Secondary | ICD-10-CM | POA: Diagnosis not present

## 2011-03-31 DIAGNOSIS — M545 Low back pain: Secondary | ICD-10-CM | POA: Diagnosis not present

## 2011-03-31 DIAGNOSIS — S7290XD Unspecified fracture of unspecified femur, subsequent encounter for closed fracture with routine healing: Secondary | ICD-10-CM | POA: Diagnosis not present

## 2011-03-31 DIAGNOSIS — K589 Irritable bowel syndrome without diarrhea: Secondary | ICD-10-CM | POA: Diagnosis not present

## 2011-03-31 DIAGNOSIS — R269 Unspecified abnormalities of gait and mobility: Secondary | ICD-10-CM | POA: Diagnosis not present

## 2011-03-31 DIAGNOSIS — IMO0001 Reserved for inherently not codable concepts without codable children: Secondary | ICD-10-CM | POA: Diagnosis not present

## 2011-04-02 DIAGNOSIS — M545 Low back pain: Secondary | ICD-10-CM | POA: Diagnosis not present

## 2011-04-02 DIAGNOSIS — S7290XD Unspecified fracture of unspecified femur, subsequent encounter for closed fracture with routine healing: Secondary | ICD-10-CM | POA: Diagnosis not present

## 2011-04-02 DIAGNOSIS — K589 Irritable bowel syndrome without diarrhea: Secondary | ICD-10-CM | POA: Diagnosis not present

## 2011-04-02 DIAGNOSIS — R269 Unspecified abnormalities of gait and mobility: Secondary | ICD-10-CM | POA: Diagnosis not present

## 2011-04-02 DIAGNOSIS — IMO0001 Reserved for inherently not codable concepts without codable children: Secondary | ICD-10-CM | POA: Diagnosis not present

## 2011-04-05 DIAGNOSIS — R269 Unspecified abnormalities of gait and mobility: Secondary | ICD-10-CM | POA: Diagnosis not present

## 2011-04-05 DIAGNOSIS — K589 Irritable bowel syndrome without diarrhea: Secondary | ICD-10-CM | POA: Diagnosis not present

## 2011-04-05 DIAGNOSIS — IMO0001 Reserved for inherently not codable concepts without codable children: Secondary | ICD-10-CM | POA: Diagnosis not present

## 2011-04-05 DIAGNOSIS — S7290XD Unspecified fracture of unspecified femur, subsequent encounter for closed fracture with routine healing: Secondary | ICD-10-CM | POA: Diagnosis not present

## 2011-04-05 DIAGNOSIS — M545 Low back pain: Secondary | ICD-10-CM | POA: Diagnosis not present

## 2011-04-07 ENCOUNTER — Other Ambulatory Visit: Payer: Self-pay

## 2011-04-07 DIAGNOSIS — C50419 Malignant neoplasm of upper-outer quadrant of unspecified female breast: Secondary | ICD-10-CM

## 2011-04-07 MED ORDER — DULOXETINE HCL 30 MG PO CPEP: 30.0000 mg | ORAL_CAPSULE | Freq: Every day | ORAL | Status: DC

## 2011-04-07 MED ORDER — GABAPENTIN 100 MG PO CAPS: 200.0000 mg | ORAL_CAPSULE | Freq: Three times a day (TID) | ORAL | Status: DC

## 2011-04-09 DIAGNOSIS — C50919 Malignant neoplasm of unspecified site of unspecified female breast: Secondary | ICD-10-CM | POA: Diagnosis not present

## 2011-04-09 DIAGNOSIS — IMO0001 Reserved for inherently not codable concepts without codable children: Secondary | ICD-10-CM | POA: Diagnosis not present

## 2011-04-09 DIAGNOSIS — D649 Anemia, unspecified: Secondary | ICD-10-CM | POA: Diagnosis not present

## 2011-04-09 DIAGNOSIS — I10 Essential (primary) hypertension: Secondary | ICD-10-CM | POA: Diagnosis not present

## 2011-04-09 DIAGNOSIS — R269 Unspecified abnormalities of gait and mobility: Secondary | ICD-10-CM | POA: Diagnosis not present

## 2011-04-09 DIAGNOSIS — S7290XD Unspecified fracture of unspecified femur, subsequent encounter for closed fracture with routine healing: Secondary | ICD-10-CM | POA: Diagnosis not present

## 2011-04-09 DIAGNOSIS — M545 Low back pain: Secondary | ICD-10-CM | POA: Diagnosis not present

## 2011-04-09 DIAGNOSIS — K589 Irritable bowel syndrome without diarrhea: Secondary | ICD-10-CM | POA: Diagnosis not present

## 2011-04-09 DIAGNOSIS — J209 Acute bronchitis, unspecified: Secondary | ICD-10-CM | POA: Diagnosis not present

## 2011-04-12 DIAGNOSIS — IMO0001 Reserved for inherently not codable concepts without codable children: Secondary | ICD-10-CM | POA: Diagnosis not present

## 2011-04-12 DIAGNOSIS — S7290XD Unspecified fracture of unspecified femur, subsequent encounter for closed fracture with routine healing: Secondary | ICD-10-CM | POA: Diagnosis not present

## 2011-04-12 DIAGNOSIS — K589 Irritable bowel syndrome without diarrhea: Secondary | ICD-10-CM | POA: Diagnosis not present

## 2011-04-12 DIAGNOSIS — R269 Unspecified abnormalities of gait and mobility: Secondary | ICD-10-CM | POA: Diagnosis not present

## 2011-04-12 DIAGNOSIS — M545 Low back pain: Secondary | ICD-10-CM | POA: Diagnosis not present

## 2011-04-16 DIAGNOSIS — K589 Irritable bowel syndrome without diarrhea: Secondary | ICD-10-CM | POA: Diagnosis not present

## 2011-04-16 DIAGNOSIS — S7290XD Unspecified fracture of unspecified femur, subsequent encounter for closed fracture with routine healing: Secondary | ICD-10-CM | POA: Diagnosis not present

## 2011-04-16 DIAGNOSIS — R269 Unspecified abnormalities of gait and mobility: Secondary | ICD-10-CM | POA: Diagnosis not present

## 2011-04-16 DIAGNOSIS — IMO0001 Reserved for inherently not codable concepts without codable children: Secondary | ICD-10-CM | POA: Diagnosis not present

## 2011-04-16 DIAGNOSIS — M545 Low back pain: Secondary | ICD-10-CM | POA: Diagnosis not present

## 2011-04-19 DIAGNOSIS — S7290XA Unspecified fracture of unspecified femur, initial encounter for closed fracture: Secondary | ICD-10-CM | POA: Diagnosis not present

## 2011-04-20 DIAGNOSIS — S7290XD Unspecified fracture of unspecified femur, subsequent encounter for closed fracture with routine healing: Secondary | ICD-10-CM | POA: Diagnosis not present

## 2011-04-20 DIAGNOSIS — M545 Low back pain: Secondary | ICD-10-CM | POA: Diagnosis not present

## 2011-04-20 DIAGNOSIS — IMO0001 Reserved for inherently not codable concepts without codable children: Secondary | ICD-10-CM | POA: Diagnosis not present

## 2011-04-20 DIAGNOSIS — K589 Irritable bowel syndrome without diarrhea: Secondary | ICD-10-CM | POA: Diagnosis not present

## 2011-04-20 DIAGNOSIS — R269 Unspecified abnormalities of gait and mobility: Secondary | ICD-10-CM | POA: Diagnosis not present

## 2011-04-22 DIAGNOSIS — S7290XD Unspecified fracture of unspecified femur, subsequent encounter for closed fracture with routine healing: Secondary | ICD-10-CM | POA: Diagnosis not present

## 2011-04-22 DIAGNOSIS — M545 Low back pain: Secondary | ICD-10-CM | POA: Diagnosis not present

## 2011-04-22 DIAGNOSIS — K589 Irritable bowel syndrome without diarrhea: Secondary | ICD-10-CM | POA: Diagnosis not present

## 2011-04-22 DIAGNOSIS — R269 Unspecified abnormalities of gait and mobility: Secondary | ICD-10-CM | POA: Diagnosis not present

## 2011-04-22 DIAGNOSIS — IMO0001 Reserved for inherently not codable concepts without codable children: Secondary | ICD-10-CM | POA: Diagnosis not present

## 2011-04-26 DIAGNOSIS — IMO0001 Reserved for inherently not codable concepts without codable children: Secondary | ICD-10-CM | POA: Diagnosis not present

## 2011-04-26 DIAGNOSIS — M545 Low back pain: Secondary | ICD-10-CM | POA: Diagnosis not present

## 2011-04-26 DIAGNOSIS — K589 Irritable bowel syndrome without diarrhea: Secondary | ICD-10-CM | POA: Diagnosis not present

## 2011-04-26 DIAGNOSIS — S7290XD Unspecified fracture of unspecified femur, subsequent encounter for closed fracture with routine healing: Secondary | ICD-10-CM | POA: Diagnosis not present

## 2011-04-26 DIAGNOSIS — R269 Unspecified abnormalities of gait and mobility: Secondary | ICD-10-CM | POA: Diagnosis not present

## 2011-04-29 DIAGNOSIS — K589 Irritable bowel syndrome without diarrhea: Secondary | ICD-10-CM | POA: Diagnosis not present

## 2011-04-29 DIAGNOSIS — IMO0001 Reserved for inherently not codable concepts without codable children: Secondary | ICD-10-CM | POA: Diagnosis not present

## 2011-04-29 DIAGNOSIS — S7290XD Unspecified fracture of unspecified femur, subsequent encounter for closed fracture with routine healing: Secondary | ICD-10-CM | POA: Diagnosis not present

## 2011-04-29 DIAGNOSIS — R269 Unspecified abnormalities of gait and mobility: Secondary | ICD-10-CM | POA: Diagnosis not present

## 2011-04-29 DIAGNOSIS — M545 Low back pain: Secondary | ICD-10-CM | POA: Diagnosis not present

## 2011-05-02 DIAGNOSIS — K589 Irritable bowel syndrome without diarrhea: Secondary | ICD-10-CM | POA: Diagnosis not present

## 2011-05-02 DIAGNOSIS — R269 Unspecified abnormalities of gait and mobility: Secondary | ICD-10-CM | POA: Diagnosis not present

## 2011-05-02 DIAGNOSIS — IMO0001 Reserved for inherently not codable concepts without codable children: Secondary | ICD-10-CM | POA: Diagnosis not present

## 2011-05-02 DIAGNOSIS — M545 Low back pain: Secondary | ICD-10-CM | POA: Diagnosis not present

## 2011-05-02 DIAGNOSIS — S7290XD Unspecified fracture of unspecified femur, subsequent encounter for closed fracture with routine healing: Secondary | ICD-10-CM | POA: Diagnosis not present

## 2011-05-05 DIAGNOSIS — K589 Irritable bowel syndrome without diarrhea: Secondary | ICD-10-CM | POA: Diagnosis not present

## 2011-05-05 DIAGNOSIS — M545 Low back pain: Secondary | ICD-10-CM | POA: Diagnosis not present

## 2011-05-05 DIAGNOSIS — R269 Unspecified abnormalities of gait and mobility: Secondary | ICD-10-CM | POA: Diagnosis not present

## 2011-05-05 DIAGNOSIS — IMO0001 Reserved for inherently not codable concepts without codable children: Secondary | ICD-10-CM | POA: Diagnosis not present

## 2011-05-05 DIAGNOSIS — S7290XD Unspecified fracture of unspecified femur, subsequent encounter for closed fracture with routine healing: Secondary | ICD-10-CM | POA: Diagnosis not present

## 2011-05-06 DIAGNOSIS — S7290XA Unspecified fracture of unspecified femur, initial encounter for closed fracture: Secondary | ICD-10-CM | POA: Diagnosis not present

## 2011-05-11 DIAGNOSIS — IMO0001 Reserved for inherently not codable concepts without codable children: Secondary | ICD-10-CM | POA: Diagnosis not present

## 2011-05-11 DIAGNOSIS — M545 Low back pain: Secondary | ICD-10-CM | POA: Diagnosis not present

## 2011-05-11 DIAGNOSIS — S7290XD Unspecified fracture of unspecified femur, subsequent encounter for closed fracture with routine healing: Secondary | ICD-10-CM | POA: Diagnosis not present

## 2011-05-11 DIAGNOSIS — K589 Irritable bowel syndrome without diarrhea: Secondary | ICD-10-CM | POA: Diagnosis not present

## 2011-05-11 DIAGNOSIS — R269 Unspecified abnormalities of gait and mobility: Secondary | ICD-10-CM | POA: Diagnosis not present

## 2011-05-13 DIAGNOSIS — IMO0001 Reserved for inherently not codable concepts without codable children: Secondary | ICD-10-CM | POA: Diagnosis not present

## 2011-05-13 DIAGNOSIS — K589 Irritable bowel syndrome without diarrhea: Secondary | ICD-10-CM | POA: Diagnosis not present

## 2011-05-13 DIAGNOSIS — R269 Unspecified abnormalities of gait and mobility: Secondary | ICD-10-CM | POA: Diagnosis not present

## 2011-05-13 DIAGNOSIS — M545 Low back pain: Secondary | ICD-10-CM | POA: Diagnosis not present

## 2011-05-13 DIAGNOSIS — S7290XD Unspecified fracture of unspecified femur, subsequent encounter for closed fracture with routine healing: Secondary | ICD-10-CM | POA: Diagnosis not present

## 2011-05-18 DIAGNOSIS — IMO0001 Reserved for inherently not codable concepts without codable children: Secondary | ICD-10-CM | POA: Diagnosis not present

## 2011-05-18 DIAGNOSIS — R269 Unspecified abnormalities of gait and mobility: Secondary | ICD-10-CM | POA: Diagnosis not present

## 2011-05-18 DIAGNOSIS — K589 Irritable bowel syndrome without diarrhea: Secondary | ICD-10-CM | POA: Diagnosis not present

## 2011-05-18 DIAGNOSIS — S7290XD Unspecified fracture of unspecified femur, subsequent encounter for closed fracture with routine healing: Secondary | ICD-10-CM | POA: Diagnosis not present

## 2011-05-18 DIAGNOSIS — M545 Low back pain: Secondary | ICD-10-CM | POA: Diagnosis not present

## 2011-05-19 DIAGNOSIS — S7290XD Unspecified fracture of unspecified femur, subsequent encounter for closed fracture with routine healing: Secondary | ICD-10-CM | POA: Diagnosis not present

## 2011-05-19 DIAGNOSIS — R269 Unspecified abnormalities of gait and mobility: Secondary | ICD-10-CM | POA: Diagnosis not present

## 2011-05-19 DIAGNOSIS — IMO0001 Reserved for inherently not codable concepts without codable children: Secondary | ICD-10-CM | POA: Diagnosis not present

## 2011-05-19 DIAGNOSIS — M545 Low back pain: Secondary | ICD-10-CM | POA: Diagnosis not present

## 2011-05-19 DIAGNOSIS — K589 Irritable bowel syndrome without diarrhea: Secondary | ICD-10-CM | POA: Diagnosis not present

## 2011-05-25 DIAGNOSIS — K589 Irritable bowel syndrome without diarrhea: Secondary | ICD-10-CM | POA: Diagnosis not present

## 2011-05-25 DIAGNOSIS — IMO0001 Reserved for inherently not codable concepts without codable children: Secondary | ICD-10-CM | POA: Diagnosis not present

## 2011-05-25 DIAGNOSIS — R269 Unspecified abnormalities of gait and mobility: Secondary | ICD-10-CM | POA: Diagnosis not present

## 2011-05-25 DIAGNOSIS — S7290XD Unspecified fracture of unspecified femur, subsequent encounter for closed fracture with routine healing: Secondary | ICD-10-CM | POA: Diagnosis not present

## 2011-05-25 DIAGNOSIS — M545 Low back pain: Secondary | ICD-10-CM | POA: Diagnosis not present

## 2011-06-01 ENCOUNTER — Ambulatory Visit
Admission: RE | Admit: 2011-06-01 | Discharge: 2011-06-01 | Disposition: A | Discharge status: Home / Self Care | Payer: Medicare Other | Source: Ambulatory Visit | Attending: Oncology | Admitting: Oncology

## 2011-06-01 DIAGNOSIS — Z853 Personal history of malignant neoplasm of breast: Secondary | ICD-10-CM | POA: Diagnosis not present

## 2011-06-01 DIAGNOSIS — C50919 Malignant neoplasm of unspecified site of unspecified female breast: Secondary | ICD-10-CM

## 2011-06-10 ENCOUNTER — Telehealth: Payer: Self-pay | Admitting: *Deleted

## 2011-06-10 ENCOUNTER — Other Ambulatory Visit (HOSPITAL_BASED_OUTPATIENT_CLINIC_OR_DEPARTMENT_OTHER): Payer: Medicare Other | Admitting: Lab

## 2011-06-10 ENCOUNTER — Ambulatory Visit (HOSPITAL_BASED_OUTPATIENT_CLINIC_OR_DEPARTMENT_OTHER): Payer: Medicare Other | Admitting: Oncology

## 2011-06-10 VITALS — BP 114/74 | HR 85 | Temp 98.9°F | Ht 67.5 in | Wt 142.8 lb

## 2011-06-10 DIAGNOSIS — M79609 Pain in unspecified limb: Secondary | ICD-10-CM | POA: Diagnosis not present

## 2011-06-10 DIAGNOSIS — E559 Vitamin D deficiency, unspecified: Secondary | ICD-10-CM

## 2011-06-10 DIAGNOSIS — C50419 Malignant neoplasm of upper-outer quadrant of unspecified female breast: Secondary | ICD-10-CM

## 2011-06-10 DIAGNOSIS — C50919 Malignant neoplasm of unspecified site of unspecified female breast: Secondary | ICD-10-CM

## 2011-06-10 DIAGNOSIS — Z8781 Personal history of (healed) traumatic fracture: Secondary | ICD-10-CM

## 2011-06-10 LAB — CBC WITH DIFFERENTIAL/PLATELET
Basophils Absolute: 0 10*3/uL (ref 0.0–0.1)
EOS%: 1.7 % (ref 0.0–7.0)
Eosinophils Absolute: 0.1 10*3/uL (ref 0.0–0.5)
HCT: 41 % (ref 34.8–46.6)
HGB: 13.8 g/dL (ref 11.6–15.9)
MCH: 31.6 pg (ref 25.1–34.0)
MONO#: 0.5 10*3/uL (ref 0.1–0.9)
NEUT#: 3.7 10*3/uL (ref 1.5–6.5)
NEUT%: 65.7 % (ref 38.4–76.8)
lymph#: 1.3 10*3/uL (ref 0.9–3.3)

## 2011-06-10 LAB — COMPREHENSIVE METABOLIC PANEL
AST: 12 U/L (ref 0–37)
BUN: 16 mg/dL (ref 6–23)
Calcium: 9.2 mg/dL (ref 8.4–10.5)
Chloride: 104 mEq/L (ref 96–112)
Creatinine, Ser: 0.8 mg/dL (ref 0.50–1.10)
Glucose, Bld: 88 mg/dL (ref 70–99)

## 2011-06-10 MED ORDER — DULOXETINE HCL 30 MG PO CPEP: 60.0000 mg | ORAL_CAPSULE | Freq: Every day | ORAL | Status: DC

## 2011-06-10 NOTE — Progress Notes (Signed)
Hematology and Oncology Follow Up Visit  [name] [facility] [id number] [birth year] 77 y.o. 06/10/2011 1:37 PM  Dr Rinaldo Cloud  Principle Diagnosis: 77 yo with hx of [place] breast cancer diagnosed 11/2009, s/p FEC x4 followed by abraxane q week x3/4 ;total of 4 cycles, s/p surgery 09/2010 with residual dcis.; s/p xrtr rt breast completed 12/2010. Complicated by fall and fractured femur (lt), s/p orif. Hx dcis lt breast 1996  Interim History:  Since being seen last the patient is doing well. She has completed rehabilitation and is working at home because of the fractured left femur. There is ongoing pain in her femur. She may require more surgery for this. She has been taking Cymbalta 30 mg a day as well as Neurontin to 1800 mg a day for the neuropathy. This is actually much better and she does not have any neuropathy at this point but is having discomfort and some numbness which he thinks might be slightly worse. Aside from [place] have reviewed the patient's current medications.edication reviewed/display:[id number]}  Allergies:  Allergies  Allergen Reactions  . Penicillins Anaphylaxis  . Adhesive (Tape) Other (See Comments)    Blisters   . Doxycycline Nausea And Vomiting    Past Medical History, Surgical history, Social history, and Family History were reviewed and updated.  Review of Systems: Constitutional:  Negative for fever, chills, night sweats, anorexia, weight loss, pain. Cardiovascular: no chest pain or dyspnea on exertion Respiratory: no cough, shortness of breath, or wheezing Neurological: no TIA or stroke symptoms Dermatological: negative ENT: negative Skin Gastrointestinal: no abdominal pain, change in bowel habits, or black or bloody stools Genito-Urinary: no dysuria, trouble voiding, or hematuria Hematological and Lymphatic: negative Breast: negative for breast lumps Musculoskeletal: positive for - pain in hip - left, upper Remaining ROS negative.  Physical Exam: Blood pressure  114/74, pulse 85, temperature 98.9 F (37.2 C), temperature source Oral, height 5' 7.5" (1.715 m), weight 142 lb 12.8 oz (64.774 kg). ECOG:  General appearance: alert, cooperative and appears stated age Head: Normocephalic, without obvious abnormality, atraumatic Neck: no adenopathy, no carotid bruit, no JVD, supple, symmetrical, trachea midline and thyroid not enlarged, symmetric, no tenderness/mass/nodules Lymph nodes: Cervical, supraclavicular, and axillary nodes normal. Cardiac : nl Pulmonary: Normal Breasts: Right breast status post lumpectomy and radiation healing well residual pigmentation changes. Left breast and axilla normal Abdomen: Normal Extremities status post left action or stiffness and decreased ambulation as a result normal Neuro:  Lab Results: Lab Results  Component Value Date   WBC 5.6 06/10/2011   HGB 13.8 06/10/2011   HCT 41.0 06/10/2011   MCV 93.8 06/10/2011   PLT 172 06/10/2011     Chemistry      Component Value Date/Time   NA 135 01/11/2011 0938   K 3.9 01/11/2011 0938   CL 98 01/11/2011 0938   CO2 28 01/11/2011 0938   BUN 20 01/11/2011 0938   CREATININE 0.78 01/11/2011 0938      Component Value Date/Time   CALCIUM 9.3 01/11/2011 0938   ALKPHOS 144* 01/05/2011 0635   AST 17 01/05/2011 0635   ALT 22 01/05/2011 0635   BILITOT 0.4 01/05/2011 1478       Radiological Studies: chest X-ray NA Mammogram Schedule of the right mammogram in spring Bone density na  Impression and Plan: Patient is doing well considering her recent experience. She'll be seen in approximately 6 months.Bilateral mammograms 05/31/11 was within normal limits. I have recommended an increase of cymbalta to 60 mg/d and an increase of  of the hs neurontin 300 mg. More than 50% of the visit was spent in patient-related counselling   Pierce Crane, MD 4/11/20131:37 PM

## 2011-06-10 NOTE — Telephone Encounter (Signed)
gave patient appointment for 12-10-2011 printed out calendar and gave to the patient

## 2011-06-21 ENCOUNTER — Ambulatory Visit
Admission: RE | Admit: 2011-06-21 | Discharge: 2011-06-21 | Disposition: A | Discharge status: Home / Self Care | Payer: Medicare Other | Source: Ambulatory Visit | Attending: Oncology | Admitting: Oncology

## 2011-06-21 DIAGNOSIS — Z780 Asymptomatic menopausal state: Secondary | ICD-10-CM | POA: Diagnosis not present

## 2011-06-21 DIAGNOSIS — Z8739 Personal history of other diseases of the musculoskeletal system and connective tissue: Secondary | ICD-10-CM

## 2011-06-21 DIAGNOSIS — E559 Vitamin D deficiency, unspecified: Secondary | ICD-10-CM

## 2011-06-21 DIAGNOSIS — C50919 Malignant neoplasm of unspecified site of unspecified female breast: Secondary | ICD-10-CM

## 2011-06-21 DIAGNOSIS — M810 Age-related osteoporosis without current pathological fracture: Secondary | ICD-10-CM | POA: Diagnosis not present

## 2011-06-21 DIAGNOSIS — Z13820 Encounter for screening for osteoporosis: Secondary | ICD-10-CM | POA: Diagnosis not present

## 2011-06-21 DIAGNOSIS — C50419 Malignant neoplasm of upper-outer quadrant of unspecified female breast: Secondary | ICD-10-CM

## 2011-06-25 ENCOUNTER — Encounter (INDEPENDENT_AMBULATORY_CARE_PROVIDER_SITE_OTHER): Payer: Self-pay | Admitting: Surgery

## 2011-06-25 ENCOUNTER — Ambulatory Visit (INDEPENDENT_AMBULATORY_CARE_PROVIDER_SITE_OTHER): Payer: Medicare Other | Admitting: Surgery

## 2011-06-25 VITALS — BP 112/70 | HR 72 | Resp 16 | Ht 66.0 in | Wt 143.0 lb

## 2011-06-25 DIAGNOSIS — Z853 Personal history of malignant neoplasm of breast: Secondary | ICD-10-CM

## 2011-06-25 NOTE — Progress Notes (Signed)
NAME: [name]       DOB: May 31, 1934           DATE: 06/25/2011       MRN: [id number]   [name] is a 77 y.o.Marland Kitchenfemale who presents for routine followup of her Right breast cancer diagnosed in 2012 and treated with neoadjuvant chem, lumpectomy, and radiation. She has no problems or concerns on either side.She had a left breast cancer treated with central partial mastectomy, axillary dissection and radiation 16 years ago  PFSH: Since her last visit her she fell and broke her femur, requiring a rod. Apparently she fell off the radiation treatment table. She is still using a cain to walk.  ROS: There have been no significant changes since the last visit here  EXAM: General: The patient is alert, oriented, generally healty appearing, NAD. Mood and affect are normal.She appears much thinner than I recall her  Breasts:  Right shows slight deformity around the nipple areolar area from surgery. No mass. S/P central mastectomy on left. Both sides slightly tender. No mass or indication of recurrence  Lymphatics: She has no axillary or supraclavicular adenopathy on either side.  Extremities: Full ROM of the surgical side with no lymphedema noted.  Data Reviewed: Epic notes  Impression: Doing well, with no evidence of recurrent cancer or new cancer  Plan: RTC PRN. She is already followed by multiple physicians and I told her we would see her if any problems develop

## 2011-06-25 NOTE — Patient Instructions (Signed)
We will see you again on an as needed basis. Please call the office at [phone] if you have any questions or concerns. Thank you for allowing us to take care of you.  

## 2011-07-29 DIAGNOSIS — S7290XA Unspecified fracture of unspecified femur, initial encounter for closed fracture: Secondary | ICD-10-CM | POA: Diagnosis not present

## 2011-07-29 DIAGNOSIS — M79609 Pain in unspecified limb: Secondary | ICD-10-CM | POA: Diagnosis not present

## 2011-08-09 DIAGNOSIS — E041 Nontoxic single thyroid nodule: Secondary | ICD-10-CM | POA: Diagnosis not present

## 2011-08-09 DIAGNOSIS — R238 Other skin changes: Secondary | ICD-10-CM | POA: Diagnosis not present

## 2011-08-09 DIAGNOSIS — E785 Hyperlipidemia, unspecified: Secondary | ICD-10-CM | POA: Diagnosis not present

## 2011-08-09 DIAGNOSIS — I10 Essential (primary) hypertension: Secondary | ICD-10-CM | POA: Diagnosis not present

## 2011-08-09 DIAGNOSIS — M810 Age-related osteoporosis without current pathological fracture: Secondary | ICD-10-CM | POA: Diagnosis not present

## 2011-08-20 DIAGNOSIS — Z961 Presence of intraocular lens: Secondary | ICD-10-CM | POA: Diagnosis not present

## 2011-08-20 DIAGNOSIS — H532 Diplopia: Secondary | ICD-10-CM | POA: Diagnosis not present

## 2011-08-20 DIAGNOSIS — H52209 Unspecified astigmatism, unspecified eye: Secondary | ICD-10-CM | POA: Diagnosis not present

## 2011-09-09 DIAGNOSIS — S7290XA Unspecified fracture of unspecified femur, initial encounter for closed fracture: Secondary | ICD-10-CM | POA: Diagnosis not present

## 2011-09-14 ENCOUNTER — Encounter: Payer: Self-pay | Admitting: *Deleted

## 2011-09-14 ENCOUNTER — Other Ambulatory Visit: Payer: Self-pay | Admitting: *Deleted

## 2011-09-14 DIAGNOSIS — E559 Vitamin D deficiency, unspecified: Secondary | ICD-10-CM

## 2011-09-14 DIAGNOSIS — C50919 Malignant neoplasm of unspecified site of unspecified female breast: Secondary | ICD-10-CM

## 2011-09-14 DIAGNOSIS — C50419 Malignant neoplasm of upper-outer quadrant of unspecified female breast: Secondary | ICD-10-CM

## 2011-09-14 MED ORDER — DULOXETINE HCL 30 MG PO CPEP: 60.0000 mg | ORAL_CAPSULE | Freq: Every day | ORAL | Status: DC

## 2011-09-27 ENCOUNTER — Other Ambulatory Visit: Payer: Self-pay | Admitting: Family

## 2011-09-27 DIAGNOSIS — C50919 Malignant neoplasm of unspecified site of unspecified female breast: Secondary | ICD-10-CM

## 2011-09-28 ENCOUNTER — Ambulatory Visit (HOSPITAL_BASED_OUTPATIENT_CLINIC_OR_DEPARTMENT_OTHER): Payer: Medicare Other | Admitting: Family

## 2011-09-28 ENCOUNTER — Other Ambulatory Visit (HOSPITAL_BASED_OUTPATIENT_CLINIC_OR_DEPARTMENT_OTHER): Payer: Medicare Other | Admitting: Lab

## 2011-09-28 VITALS — BP 110/66 | HR 82 | Temp 97.5°F | Ht 66.0 in | Wt 150.2 lb

## 2011-09-28 DIAGNOSIS — C50419 Malignant neoplasm of upper-outer quadrant of unspecified female breast: Secondary | ICD-10-CM

## 2011-09-28 DIAGNOSIS — C50919 Malignant neoplasm of unspecified site of unspecified female breast: Secondary | ICD-10-CM

## 2011-09-28 DIAGNOSIS — M810 Age-related osteoporosis without current pathological fracture: Secondary | ICD-10-CM | POA: Diagnosis not present

## 2011-09-28 LAB — CBC WITH DIFFERENTIAL/PLATELET
Basophils Absolute: 0.1 10*3/uL (ref 0.0–0.1)
Eosinophils Absolute: 0 10*3/uL (ref 0.0–0.5)
HCT: 41.4 % (ref 34.8–46.6)
HGB: 14 g/dL (ref 11.6–15.9)
LYMPH%: 22.8 % (ref 14.0–49.7)
MCV: 98 fL (ref 79.5–101.0)
MONO#: 0.6 10*3/uL (ref 0.1–0.9)
MONO%: 7.4 % (ref 0.0–14.0)
NEUT#: 5.6 10*3/uL (ref 1.5–6.5)
NEUT%: 68.3 % (ref 38.4–76.8)
Platelets: 167 10*3/uL (ref 145–400)
WBC: 8.1 10*3/uL (ref 3.9–10.3)

## 2011-09-28 MED ORDER — LORAZEPAM 0.5 MG PO TABS: 0.5000 mg | ORAL_TABLET | Freq: Three times a day (TID) | ORAL | Status: DC | PRN

## 2011-09-29 ENCOUNTER — Encounter: Payer: Self-pay | Admitting: Family

## 2011-09-29 NOTE — Progress Notes (Signed)
Hematology and Oncology Follow Up Visit  [name] [name] [id number] 08-16-1934 77 y.o. 09/29/2011 11:34 AM  Dr [name]  Principle Diagnosis: 77 yo with hx of [place] breast cancer diagnosed 11/2009, s/p FEC x4 followed by abraxane q week x3/4 ;total of 4 cycles, s/p surgery 09/2010 with residual dcis.; s/p xrtr rt breast completed 12/2010. Complicated by fall and fractured femur (lt), s/p orif. Hx dcis lt breast 1996  Interim History:  Since being seen last the patient is doing well. She has completed rehabilitation and is working at home because of the fractured left femur. There is ongoing pain in her femur. She may require more surgery, was told recently there is a "loose screw" in the appliance that needs to be removed. Takes Cymbalta 30 mg a day with Neurontin to 1800 mg a day for neuropathy. No neuropathy at this time, continues to have discomfort and some numbness.  Last mammogram and bone density April 2013. Mammogram: no evidence of malignancy. Bone Density: Osteoporosis at the femur.   Chief complaint is thin skin and multiple skin tears with bruising, bilateral arms and legs. She states this has been present since initiation of chemotherapy.   Allergies:  Allergies  Allergen Reactions  . Penicillins Anaphylaxis  . Adhesive (Tape) Other (See Comments)    Blisters   . Doxycycline Nausea And Vomiting    Past Medical History, Surgical history, Social history, and Family History were reviewed and updated.  Physical Exam: Blood pressure 110/66, pulse 82, temperature 97.5 F (36.4 C), height 5\' 6"  (1.676 m), weight 150 lb 3.2 oz (68.13 kg). ECOG:  General: Well developed, well nourished, in no acute distress. Walks with a cane. Accompanied by her husband.  EENT: No ocular or oral lesions. No stomatitis.  Respiratory: Lungs are clear to auscultation bilaterally with normal respiratory movement and no accessory muscle use. Cardiac: No murmur, rub or tachycardia. No upper or lower  extremity edema.  GI: Abdomen is soft, no palpable hepatosplenomegaly. No fluid wave. No tenderness. Musculoskeletal: No kyphosis, tenderness over the spine, and left hip with palpation.  Lymph: No cervical, infraclavicular, axillary or inguinal adenopathy. Neuro: No focal neurological deficits. Psych: Alert and oriented X 3, appropriate mood and affect.  BREAST EXAM: In the supine position, with the right arm over the head, the right nipple is everted. No periareolar edema or nipple discharge. No mass in any quadrant or subareolar region.Well healed remote scar at the areola margin, laterally.  No redness of the skin. No right axillary adenopathy. With the left arm over the head, the left nipple is surgically absent with central mastectomy curved well healed incision. Left breast is smaller in volume than the right. No mass in any quadrant or subareolar region. No redness of the skin. No left axillary adenopathy.    Lab Results: Lab Results  Component Value Date   WBC 8.1 09/28/2011   HGB 14.0 09/28/2011   HCT 41.4 09/28/2011   MCV 98.0 09/28/2011   PLT 167 09/28/2011     Chemistry      Component Value Date/Time   NA 141 06/10/2011 1205   K 4.0 06/10/2011 1205   CL 104 06/10/2011 1205   CO2 30 06/10/2011 1205   BUN 16 06/10/2011 1205   CREATININE 0.80 06/10/2011 1205      Component Value Date/Time   CALCIUM 9.2 06/10/2011 1205   ALKPHOS 105 06/10/2011 1205   AST 12 06/10/2011 1205   ALT 9 06/10/2011 1205   BILITOT 0.5 06/10/2011  1205      Impression: 77 year old female with:  1. History of DCIS left breast 1996. 2. More recent invasive right breast cancer, completed chemo and radiation. No evidence of recurrence mammographically or clinically.  3. Osteoporosis, takes Vit D.  4. Normal mammogram 06/01/11.  5. Released by Dr. Jamey [name].  6. Neuropathy, result of chemotherapy.  7. Thin skin with multiple bruises and skin tears, arms and leg.   Plan: 1. Return 12/10/11 for appt with Dr. Donnie [name]  with lab prior.  2. Will request pre-cert for Prolia to address osteoporosis.  3. Vit. E oil to skin daily.  Patient is doing well considering her recent experience. She'll be seen in approximately 6 months.Bilateral mammograms 05/31/11 was within normal limits. I have recommended an increase of cymbalta to 60 mg/d and an increase of of the hs neurontin 300 mg. More than 50% of the visit was spent in patient-related counselling   [name], Kelsey Sine, NP 7/31/201311:34 AM

## 2011-10-05 ENCOUNTER — Encounter (HOSPITAL_BASED_OUTPATIENT_CLINIC_OR_DEPARTMENT_OTHER): Payer: Self-pay | Admitting: *Deleted

## 2011-10-05 NOTE — Progress Notes (Signed)
NPO AFTER MN WITH EXCEPTION CLEAR LIQUIDS UNTIL 0830 (NO MILK/ CREAM PRODUCTS ). ARRIVES AT 1300. NEEDS ISTAT. CURRENT EKG IN EPIC AND CHART. WILL TAKE NEURONTIN, CYMBALTA, AND SYNTHROID AM OFSURG W/ SIP OF WATER.

## 2011-10-11 ENCOUNTER — Ambulatory Visit (HOSPITAL_BASED_OUTPATIENT_CLINIC_OR_DEPARTMENT_OTHER)
Admission: RE | Admit: 2011-10-11 | Discharge: 2011-10-11 | Disposition: A | Discharge status: Home / Self Care | Payer: Medicare Other | Source: Ambulatory Visit | Attending: Specialist | Admitting: Specialist

## 2011-10-11 ENCOUNTER — Encounter (HOSPITAL_BASED_OUTPATIENT_CLINIC_OR_DEPARTMENT_OTHER): Payer: Self-pay | Admitting: Anesthesiology

## 2011-10-11 ENCOUNTER — Ambulatory Visit (HOSPITAL_BASED_OUTPATIENT_CLINIC_OR_DEPARTMENT_OTHER): Payer: Medicare Other | Admitting: Anesthesiology

## 2011-10-11 ENCOUNTER — Ambulatory Visit (HOSPITAL_COMMUNITY): Payer: Medicare Other

## 2011-10-11 ENCOUNTER — Encounter (HOSPITAL_BASED_OUTPATIENT_CLINIC_OR_DEPARTMENT_OTHER): Payer: Self-pay

## 2011-10-11 ENCOUNTER — Encounter (HOSPITAL_BASED_OUTPATIENT_CLINIC_OR_DEPARTMENT_OTHER)
Admission: RE | Disposition: A | Discharge status: Home / Self Care | Payer: Self-pay | Source: Ambulatory Visit | Attending: Specialist

## 2011-10-11 DIAGNOSIS — Z853 Personal history of malignant neoplasm of breast: Secondary | ICD-10-CM | POA: Diagnosis not present

## 2011-10-11 DIAGNOSIS — Z79899 Other long term (current) drug therapy: Secondary | ICD-10-CM | POA: Diagnosis not present

## 2011-10-11 DIAGNOSIS — IMO0001 Reserved for inherently not codable concepts without codable children: Secondary | ICD-10-CM | POA: Diagnosis not present

## 2011-10-11 DIAGNOSIS — T84498A Other mechanical complication of other internal orthopedic devices, implants and grafts, initial encounter: Secondary | ICD-10-CM | POA: Diagnosis not present

## 2011-10-11 DIAGNOSIS — T8489XA Other specified complication of internal orthopedic prosthetic devices, implants and grafts, initial encounter: Secondary | ICD-10-CM | POA: Insufficient documentation

## 2011-10-11 DIAGNOSIS — Y831 Surgical operation with implant of artificial internal device as the cause of abnormal reaction of the patient, or of later complication, without mention of misadventure at the time of the procedure: Secondary | ICD-10-CM | POA: Insufficient documentation

## 2011-10-11 DIAGNOSIS — M810 Age-related osteoporosis without current pathological fracture: Secondary | ICD-10-CM | POA: Insufficient documentation

## 2011-10-11 DIAGNOSIS — IMO0002 Reserved for concepts with insufficient information to code with codable children: Secondary | ICD-10-CM | POA: Diagnosis not present

## 2011-10-11 DIAGNOSIS — Z86718 Personal history of other venous thrombosis and embolism: Secondary | ICD-10-CM | POA: Insufficient documentation

## 2011-10-11 DIAGNOSIS — M79609 Pain in unspecified limb: Secondary | ICD-10-CM | POA: Insufficient documentation

## 2011-10-11 DIAGNOSIS — T85698A Other mechanical complication of other specified internal prosthetic devices, implants and grafts, initial encounter: Secondary | ICD-10-CM | POA: Diagnosis not present

## 2011-10-11 HISTORY — DX: Drug-induced polyneuropathy: G62.0

## 2011-10-11 HISTORY — DX: Hyperlipidemia, unspecified: E78.5

## 2011-10-11 HISTORY — DX: Reserved for concepts with insufficient information to code with codable children: IMO0002

## 2011-10-11 HISTORY — DX: Nocturia: R35.1

## 2011-10-11 HISTORY — DX: Personal history of pulmonary embolism: Z86.711

## 2011-10-11 HISTORY — DX: Drug-induced polyneuropathy: T45.1X5A

## 2011-10-11 HISTORY — PX: HARDWARE REMOVAL: SHX979

## 2011-10-11 HISTORY — DX: Changes in skin texture: R23.4

## 2011-10-11 HISTORY — DX: Personal history of malignant neoplasm of breast: Z85.3

## 2011-10-11 HISTORY — DX: Other seasonal allergic rhinitis: J30.2

## 2011-10-11 HISTORY — DX: Anemia, unspecified: D64.9

## 2011-10-11 HISTORY — DX: Age-related osteoporosis without current pathological fracture: M81.0

## 2011-10-11 HISTORY — DX: Other specified postprocedural states: Z98.890

## 2011-10-11 HISTORY — DX: Personal history of other venous thrombosis and embolism: Z86.718

## 2011-10-11 HISTORY — DX: Frequency of micturition: R35.0

## 2011-10-11 HISTORY — DX: Hemorrhage, not elsewhere classified: R58

## 2011-10-11 HISTORY — DX: Nausea with vomiting, unspecified: R11.2

## 2011-10-11 HISTORY — DX: Other nonspecific abnormal finding of lung field: R91.8

## 2011-10-11 LAB — POCT I-STAT, CHEM 8
Creatinine, Ser: 0.9 mg/dL (ref 0.50–1.10)
HCT: 41 % (ref 36.0–46.0)
Hemoglobin: 13.9 g/dL (ref 12.0–15.0)
Potassium: 4 mEq/L (ref 3.5–5.1)
Sodium: 142 mEq/L (ref 135–145)
TCO2: 25 mmol/L (ref 0–100)

## 2011-10-11 SURGERY — REMOVAL, HARDWARE
Anesthesia: General | Site: Leg Lower | Laterality: Left | Wound class: Clean

## 2011-10-11 MED ORDER — FENTANYL CITRATE 0.05 MG/ML IJ SOLN
INTRAMUSCULAR | Status: DC | PRN
  Administered 2011-10-11 (×2): 25 ug via INTRAVENOUS
  Administered 2011-10-11 (×3): 50 ug via INTRAVENOUS

## 2011-10-11 MED ORDER — DEXAMETHASONE SODIUM PHOSPHATE 4 MG/ML IJ SOLN
INTRAMUSCULAR | Status: DC | PRN
  Administered 2011-10-11: 10 mg via INTRAVENOUS

## 2011-10-11 MED ORDER — MIDAZOLAM HCL 5 MG/5ML IJ SOLN: INTRAMUSCULAR | Status: DC | PRN

## 2011-10-11 MED ORDER — LIDOCAINE HCL (CARDIAC) 20 MG/ML IV SOLN
INTRAVENOUS | Status: DC | PRN
  Administered 2011-10-11: 60 mg via INTRAVENOUS

## 2011-10-11 MED ORDER — LACTATED RINGERS IV SOLN
INTRAVENOUS | Status: DC | PRN
  Administered 2011-10-11: 14:00:00 via INTRAVENOUS

## 2011-10-11 MED ORDER — KETOROLAC TROMETHAMINE 30 MG/ML IJ SOLN
INTRAMUSCULAR | Status: DC | PRN
  Administered 2011-10-11: 30 mg via INTRAVENOUS

## 2011-10-11 MED ORDER — ONDANSETRON HCL 4 MG/2ML IJ SOLN
INTRAMUSCULAR | Status: DC | PRN
  Administered 2011-10-11: 4 mg via INTRAVENOUS

## 2011-10-11 MED ORDER — ENSURE PUDDING PO PUDG: 1.0000 | Freq: Three times a day (TID) | ORAL | Status: DC

## 2011-10-11 MED ORDER — BUPIVACAINE-EPINEPHRINE 0.25% -1:200000 IJ SOLN
INTRAMUSCULAR | Status: DC | PRN
  Administered 2011-10-11: 10 mL

## 2011-10-11 MED ORDER — VANCOMYCIN HCL IN DEXTROSE 1-5 GM/200ML-% IV SOLN
1000.0000 mg | Freq: Once | INTRAVENOUS | Status: AC
  Administered 2011-10-11: 1000 mg via INTRAVENOUS

## 2011-10-11 MED ORDER — PROPOFOL 10 MG/ML IV EMUL
INTRAVENOUS | Status: DC | PRN
  Administered 2011-10-11: 160 mg via INTRAVENOUS

## 2011-10-11 MED ORDER — HYDROCODONE-ACETAMINOPHEN 5-325 MG PO TABS: 1.0000 | ORAL_TABLET | Freq: Four times a day (QID) | ORAL | Status: AC | PRN

## 2011-10-11 MED ORDER — LACTATED RINGERS IV SOLN
INTRAVENOUS | Status: DC
  Administered 2011-10-11: 14:00:00 via INTRAVENOUS

## 2011-10-11 SURGICAL SUPPLY — 61 items
BANDAGE CONFORM 3  STR LF (GAUZE/BANDAGES/DRESSINGS) IMPLANT
BANDAGE ELASTIC 3 VELCRO ST LF (GAUZE/BANDAGES/DRESSINGS) IMPLANT
BANDAGE ELASTIC 4 VELCRO ST LF (GAUZE/BANDAGES/DRESSINGS) IMPLANT
BANDAGE ELASTIC 6 VELCRO ST LF (GAUZE/BANDAGES/DRESSINGS) ×2 IMPLANT
BANDAGE ESMARK 6X9 LF (GAUZE/BANDAGES/DRESSINGS) IMPLANT
BANDAGE GAUZE ELAST BULKY 4 IN (GAUZE/BANDAGES/DRESSINGS) IMPLANT
BENZOIN TINCTURE PRP APPL 2/3 (GAUZE/BANDAGES/DRESSINGS) IMPLANT
BLADE SURG 15 STRL LF DISP TIS (BLADE) ×1 IMPLANT
BLADE SURG 15 STRL SS (BLADE) ×2
BNDG CMPR 9X4 STRL LF SNTH (GAUZE/BANDAGES/DRESSINGS)
BNDG CMPR 9X6 STRL LF SNTH (GAUZE/BANDAGES/DRESSINGS)
BNDG ESMARK 4X9 LF (GAUZE/BANDAGES/DRESSINGS) IMPLANT
BNDG ESMARK 6X9 LF (GAUZE/BANDAGES/DRESSINGS)
CANISTER SUCTION 1200CC (MISCELLANEOUS) ×2 IMPLANT
CANISTER SUCTION 2500CC (MISCELLANEOUS) IMPLANT
CLOTH BEACON ORANGE TIMEOUT ST (SAFETY) ×2 IMPLANT
COVER MAYO STAND STRL (DRAPES) IMPLANT
COVER TABLE BACK 60X90 (DRAPES) ×2 IMPLANT
DRAPE C-ARM 42X72 X-RAY (DRAPES) ×2 IMPLANT
DRAPE C-ARM MINI 42X72 WSTRAPS (DRAPES) IMPLANT
DRAPE EXTREMITY T 121X128X90 (DRAPE) IMPLANT
DRAPE ORTHO SPLIT 77X108 STRL (DRAPES) ×4
DRAPE SURG ORHT 6 SPLT 77X108 (DRAPES) ×2 IMPLANT
DRAPE U-SHAPE 47X51 STRL (DRAPES) IMPLANT
DRESSING TELFA 8X3 (GAUZE/BANDAGES/DRESSINGS) ×20 IMPLANT
DRSG EMULSION OIL 3X3 NADH (GAUZE/BANDAGES/DRESSINGS) ×2 IMPLANT
DURAPREP 26ML APPLICATOR (WOUND CARE) ×2 IMPLANT
ELECT NEEDLE TIP 2.8 STRL (NEEDLE) IMPLANT
ELECT REM PT RETURN 9FT ADLT (ELECTROSURGICAL) ×2
ELECTRODE REM PT RTRN 9FT ADLT (ELECTROSURGICAL) ×1 IMPLANT
GLOVE INDICATOR 7.0 STRL GRN (GLOVE) ×6 IMPLANT
GLOVE SURG SS PI 8.0 STRL IVOR (GLOVE) ×2 IMPLANT
GOWN W/2 COTTON TOWELS 2 STD (GOWNS) ×2 IMPLANT
GOWN XL W/COTTON TOWEL STD (GOWNS) ×2 IMPLANT
NEEDLE HYPO 22GX1.5 SAFETY (NEEDLE) ×2 IMPLANT
NS IRRIG 500ML POUR BTL (IV SOLUTION) ×2 IMPLANT
PACK BASIN DAY SURGERY FS (CUSTOM PROCEDURE TRAY) ×2 IMPLANT
PAD CAST 3X4 CTTN HI CHSV (CAST SUPPLIES) IMPLANT
PAD PREP 24X48 CUFFED NSTRL (MISCELLANEOUS) IMPLANT
PADDING CAST ABS 4INX4YD NS (CAST SUPPLIES)
PADDING CAST ABS COTTON 4X4 ST (CAST SUPPLIES) IMPLANT
PADDING CAST COTTON 3X4 STRL (CAST SUPPLIES)
PENCIL BUTTON HOLSTER BLD 10FT (ELECTRODE) ×2 IMPLANT
SPLINT PLASTER CAST XFAST 3X15 (CAST SUPPLIES) IMPLANT
SPLINT PLASTER XTRA FASTSET 3X (CAST SUPPLIES)
SPONGE GAUZE 4X4 12PLY (GAUZE/BANDAGES/DRESSINGS) ×2 IMPLANT
SPONGE LAP 4X18 X RAY DECT (DISPOSABLE) ×2 IMPLANT
STOCKINETTE 4X48 STRL (DRAPES) IMPLANT
STOCKINETTE 6  STRL (DRAPES) ×1
STOCKINETTE 6 STRL (DRAPES) ×1 IMPLANT
STRIP CLOSURE SKIN 1/2X4 (GAUZE/BANDAGES/DRESSINGS) IMPLANT
SUCTION FRAZIER TIP 10 FR DISP (SUCTIONS) ×2 IMPLANT
SUT ETHILON 4 0 PS 2 18 (SUTURE) ×2 IMPLANT
SUT VIC AB 2-0 CT1 27 (SUTURE) ×2
SUT VIC AB 2-0 CT1 TAPERPNT 27 (SUTURE) ×1 IMPLANT
SYR BULB 3OZ (MISCELLANEOUS) ×2 IMPLANT
SYR CONTROL 10ML LL (SYRINGE) ×2 IMPLANT
TOWEL OR 17X24 6PK STRL BLUE (TOWEL DISPOSABLE) ×4 IMPLANT
UNDERPAD 30X30 INCONTINENT (UNDERPADS AND DIAPERS) ×2 IMPLANT
WATER STERILE IRR 500ML POUR (IV SOLUTION) ×2 IMPLANT
YANKAUER SUCT BULB TIP NO VENT (SUCTIONS) ×2 IMPLANT

## 2011-10-11 NOTE — H&P (Signed)
[name] is an 77 y.o. female.   Chief Complaint: left thigh pain HPI: Painful retained hardware left thigh following IM nail left femur  Past Medical History  Diagnosis Date  . IBS (irritable bowel syndrome)   . Fibromyalgia   . History of DVT (deep vein thrombosis)   . History of pulmonary embolism   . Osteoporosis   . History of breast cancer LEFT BREAST DCIS  1996  . Hyperlipidemia   . 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 Breast cancer RIGHT SIDE-- DX OCT 2011    CHEMORADIATION THERAPY-- COMPLETED   . PONV (postoperative nausea and vomiting)   . Skin tear LEFT ARM COVERED W/ TEGADERM    PT STATES HX MULTIPLE SKIN TEARS -- THIN  . Thin skin SECONARDY AGE AND HX CHEMO  . Ecchymosis   . Seasonal allergies   . Frequency of urination   . Nocturia   . Anemia   . Lung mass PER DR CLANCE NOTE UNCLEAR IF LYMPHOMA FROM BX    FOLLOWED BY DR Donnie Coffin    Past Surgical History  Procedure Date  . Total abdominal hysterectomy w/ bilateral salpingoophorectomy 1977  (APPROX)  . Cholecystectomy   . Appendectomy   . Femur fracture surgery 12-18-2010  DR Xaden Kaufman    INTRAMEDULLARY NAILING LEFT INTERTROCHANTERIC -SUBTROCHANTERIC FX  . Right breast lumpectomy / removal lymph nodes x2/ removal pac 10-01-2010    CARCINOMA RIGHT BREAST  . Placement port- a- cath 02-18-2010  . Bronchoscopy 01-29-2010    W/ BX  . Tvt vaginal tape  suburethral sling  06-08-2005    SUI  . Bilateral laminectomy/ foraminotomy 01-14-2004    L4 - L5  . Breast lumpectomy 1996     LEFT BREAST DCIS   . Cataract extraction w/ intraocular lens  implant, bilateral   . Bilateral carpal tunnel release   . Tonsillectomy   . Transthoracic echocardiogram 02-25-2010    LVSF NORMAL/ EF 55-60%/ GRADE I DIASTOLIC DYSFUNCTION/ MILDLY DILATED LEFT ATRIUM    Family History  Problem Relation Age of Onset  . Emphysema Father   . Ovarian cancer Mother   . Cancer Mother     cervical   . Breast cancer Maternal Grandmother   . Cancer Sister     cervical   Social History:  reports that she quit smoking about 33 years ago. Her smoking use included Cigarettes. She has a 30 pack-year smoking history. She has never used smokeless tobacco. She reports that she drinks alcohol. She reports that she does not use illicit drugs.  Allergies:  Allergies  Allergen Reactions  . Penicillins Anaphylaxis  . Adhesive (Tape) Other (See Comments)    Blisters   . Doxycycline Nausea And Vomiting  . Morphine And Related Other (See Comments)    HALLUCINATIONS    Medications Prior to Admission  Medication Sig Dispense Refill  . Ascorbic Acid (VITAMIN C) 1000 MG tablet Take 1,000 mg by mouth daily.      . DULoxetine (CYMBALTA) 30 MG capsule Take 2 capsules (60 mg total) by mouth daily.  180 capsule  3  . ergocalciferol (VITAMIN D2) 50000 UNITS capsule Take 50,000 Units by mouth once a week.       . ezetimibe-simvastatin (VYTORIN) 10-80 MG per tablet Take 1 tablet by mouth at bedtime.       . gabapentin (NEURONTIN) 100 MG capsule Take 2-3 capsules (200-300 mg total) by mouth 3 (three) times daily.  810  capsule  3  . levothyroxine (SYNTHROID, LEVOTHROID) 88 MCG tablet Take 88 mcg by mouth every morning.       Marland Kitchen LORazepam (ATIVAN) 0.5 MG tablet Take 0.5 mg by mouth every 8 (eight) hours as needed. Anxiety      . polysaccharide iron (NIFEREX) 150 MG CAPS capsule Take 1 capsule (150 mg total) by mouth 2 (two) times daily.  60 each  1  . potassium chloride SA (K-DUR,KLOR-CON) 20 MEQ tablet Take 1 tablet (20 mEq total) by mouth daily.  30 tablet  1  . traMADol (ULTRAM) 50 MG tablet Take 50 mg by mouth every 6 (six) hours as needed. Maximum dose= 8 tablets per day      . vitamin E 400 UNIT capsule Take 400 Units by mouth daily.        No results found for this or any previous visit (from the past 48 hour(s)). No results found.  Review of Systems  Musculoskeletal: Positive for joint pain.  All  other systems reviewed and are negative.    Height 5\' 6"  (1.676 m), weight 68.04 kg (150 lb). Physical Exam  Vitals reviewed. Constitutional: She appears well-developed.  HENT:  Head: Normocephalic.  Eyes: Pupils are equal, round, and reactive to light.  Neck: Normal range of motion.  Cardiovascular: Normal rate.   Respiratory: Effort normal.  GI: Soft.  Musculoskeletal: She exhibits tenderness.       Tender over distal locking screw left thigh  Neurological: She is alert.  Skin: Skin is warm and dry.  Psychiatric: She has a normal mood and affect.     Assessment/Plan Painful hardware left distal locking screw left femur. Plan removal. Risks discussed.  Idell Hissong C 10/11/2011, 1:26 PM

## 2011-10-11 NOTE — Brief Op Note (Signed)
10/11/2011  5:23 PM  PATIENT:  [name]  77 y.o. female  PRE-OPERATIVE DIAGNOSIS:  PAINFUL HARDWARE LEFT THIGH  POST-OPERATIVE DIAGNOSIS:  PAINFUL HARDWARE LEFT THIGH  PROCEDURE:  Procedure(s) (LRB): HARDWARE REMOVAL (Left)  SURGEON:  Surgeon(s) and Role:    * Javier Docker, MD - Primary  PHYSICIAN ASSISTANT:   ASSISTANTS: none   ANESTHESIA:   general  EBL:  Total I/O In: 500 [I.V.:500] Out: -   BLOOD ADMINISTERED:none  DRAINS: none   LOCAL MEDICATIONS USED:  MARCAINE     SPECIMEN:  No Specimen  DISPOSITION OF SPECIMEN:  N/A  COUNTS:  YES  TOURNIQUET:  * No tourniquets in log *  DICTATION: .Other Dictation: Dictation Number [id number]  PLAN OF CARE: Discharge to home after PACU  PATIENT DISPOSITION:  PACU - hemodynamically stable.   Delay start of Pharmacological VTE agent (>24hrs) due to surgical blood loss or risk of bleeding: no

## 2011-10-11 NOTE — Anesthesia Procedure Notes (Signed)
Procedure Name: Intubation Date/Time: 10/11/2011 5:01 PM Performed by: Jessica Priest Pre-anesthesia Checklist: Patient identified, Emergency Drugs available, Suction available and Patient being monitored Patient Re-evaluated:Patient Re-evaluated prior to inductionOxygen Delivery Method: Circle System Utilized Preoxygenation: Pre-oxygenation with 100% oxygen Intubation Type: IV induction Ventilation: Mask ventilation without difficulty LMA: LMA inserted LMA Size: 4.0 Tube type: Oral Number of attempts: 1 Airway Equipment and Method: stylet and oral airway Placement Confirmation: ETT inserted through vocal cords under direct vision,  positive ETCO2 and breath sounds checked- equal and bilateral Tube secured with: Tape Dental Injury: Teeth and Oropharynx as per pre-operative assessment

## 2011-10-11 NOTE — Anesthesia Preprocedure Evaluation (Addendum)
Anesthesia Evaluation  Patient identified by MRN, date of birth, ID band Patient awake    Reviewed: Allergy & Precautions, H&P , NPO status , Patient's Chart, lab work & pertinent test results  History of Anesthesia Complications (+) PONV and DIFFICULT AIRWAY  Airway Mallampati: II TM Distance: >3 FB Neck ROM: full    Dental  (+) Dental Advisory Given and Caps,    Pulmonary neg pulmonary ROS, asthma , sleep apnea ,  Hx. PE.  Lung mass ( possible lymphoma ). breath sounds clear to auscultation  Pulmonary exam normal       Cardiovascular Exercise Tolerance: Good negative cardio ROS  Rhythm:regular Rate:Normal     Neuro/Psych negative neurological ROS  negative psych ROS   GI/Hepatic negative GI ROS, Neg liver ROS,   Endo/Other  negative endocrine ROS  Renal/GU negative Renal ROS  negative genitourinary   Musculoskeletal  (+) Fibromyalgia -  Abdominal   Peds  Hematology negative hematology ROS (+)   Anesthesia Other Findings   Reproductive/Obstetrics negative OB ROS                          Anesthesia Physical Anesthesia Plan  ASA: III  Anesthesia Plan: General   Post-op Pain Management:    Induction: Intravenous  Airway Management Planned: LMA  Additional Equipment:   Intra-op Plan:   Post-operative Plan:   Informed Consent: I have reviewed the patients History and Physical, chart, labs and discussed the procedure including the risks, benefits and alternatives for the proposed anesthesia with the patient or authorized representative who has indicated his/her understanding and acceptance.   Dental Advisory Given  Plan Discussed with: CRNA and Surgeon  Anesthesia Plan Comments:         Anesthesia Quick Evaluation

## 2011-10-12 ENCOUNTER — Other Ambulatory Visit: Payer: Self-pay | Admitting: *Deleted

## 2011-10-12 DIAGNOSIS — C50919 Malignant neoplasm of unspecified site of unspecified female breast: Secondary | ICD-10-CM

## 2011-10-12 NOTE — Op Note (Signed)
NAMEAFTEN, LIPSEY NO.:  [id number]  MEDICAL RECORD NO.:  [id number]  LOCATION:                               FACILITY:  [facility]  PHYSICIAN:  Jene Every, M.D.    DATE OF BIRTH:  12-11-1934  DATE OF PROCEDURE:  10/11/2011 DATE OF DISCHARGE:                              OPERATIVE REPORT   PREOPERATIVE DIAGNOSIS:  Painful retained hardware, left 5 status post IM nailing of left femur.  POSTOPERATIVE DIAGNOSIS:  Painful retained hardware, left 5 status post IM nailing of left femur.  PROCEDURE PERFORMED:  Removal of the distal locking screw, left femur, left femoral rod.  ANESTHESIA:  General.  ASSISTANT:  None.  HISTORY:  A 77 year old, fell sustained a fracture.  Intramedullary rodding on to heal the fracture.  She had some backing out as one of the distal locking screw was painful to palpation and we proceeded with removal.  The risks and benefits discussed including bleeding, infection, persistent pain, etc.  TECHNIQUE:  With the patient in supine position, after induction of adequate general anesthesia, 1 g of Vanco, the left lower extremity was prepped and draped in usual sterile fashion, taking care not to use adhesive against the skin which was very friable.  Under C-arm augmentation, we identified and palpated the screw head that was the proximal of the distal locking screws.  I made a small 1 cm incision laterally over the lateral aspect of the 5 bluntly dissected down to the fascia.  Incised the fascia, identified the screw head and back and removed it with a screwdriver without difficulty.  I then irrigated the wound copiously.  I infiltrated with 0.25% Marcaine with epinephrine. This was irrigated to the fracture site through the screw hole.  I then irrigated and debrided the screw hole at the lateral aspect of the femur.  After this copious irrigation, I closed the skin with three 4-0 nylon simple sutures.  The wound was dressed sterilely  and secured with an Ace bandage.  Poster radiograph C-arm demonstrated removal of the entire screw.  The screw was inspected and it was consistent with that. The fracture was healed.  The patient was then extubated without difficulty, and transported to the recovery room in satisfactory condition.  The patient tolerated the procedure well.  No complications.  Minimal blood loss.     Jene Every, M.D.    Cordelia Pen  D:  10/11/2011  T:  10/12/2011  Job:  [id number]

## 2011-10-13 MED ORDER — FENTANYL CITRATE 0.05 MG/ML IJ SOLN: 25.0000 ug | INTRAMUSCULAR | Status: DC | PRN

## 2011-10-13 MED ORDER — LACTATED RINGERS IV SOLN: INTRAVENOUS | Status: DC

## 2011-10-13 NOTE — Progress Notes (Signed)
Data entered today due to epic system inoperable on pt's arrival to pacu,   10/11/2011. Transcribed from paper chart.

## 2011-10-14 ENCOUNTER — Encounter (HOSPITAL_BASED_OUTPATIENT_CLINIC_OR_DEPARTMENT_OTHER): Payer: Self-pay | Admitting: Specialist

## 2011-10-14 NOTE — Anesthesia Postprocedure Evaluation (Signed)
  Anesthesia Post-op Note  Patient: [name]  Procedure(s) Performed: Procedure(s) (LRB): HARDWARE REMOVAL (Left)  Patient Location: PACU  Anesthesia Type: General  Level of Consciousness: awake and alert   Airway and Oxygen Therapy: Patient Spontanous Breathing  Post-op Pain: mild  Post-op Assessment: Post-op Vital signs reviewed, Patient's Cardiovascular Status Stable, Respiratory Function Stable, Patent Airway and No signs of Nausea or vomiting  Post-op Vital Signs: stable  Complications: No apparent anesthesia complications

## 2011-10-21 ENCOUNTER — Other Ambulatory Visit (HOSPITAL_BASED_OUTPATIENT_CLINIC_OR_DEPARTMENT_OTHER): Payer: Medicare Other | Admitting: Lab

## 2011-10-21 DIAGNOSIS — C50919 Malignant neoplasm of unspecified site of unspecified female breast: Secondary | ICD-10-CM

## 2011-10-21 LAB — CBC WITH DIFFERENTIAL/PLATELET
BASO%: 0.5 % (ref 0.0–2.0)
Basophils Absolute: 0 10*3/uL (ref 0.0–0.1)
EOS%: 0.5 % (ref 0.0–7.0)
HGB: 13.4 g/dL (ref 11.6–15.9)
MCH: 32.4 pg (ref 25.1–34.0)
MCHC: 33.2 g/dL (ref 31.5–36.0)
MCV: 97.8 fL (ref 79.5–101.0)
MONO%: 8.8 % (ref 0.0–14.0)
RBC: 4.13 10*6/uL (ref 3.70–5.45)
RDW: 13.7 % (ref 11.2–14.5)
lymph#: 1.9 10*3/uL (ref 0.9–3.3)

## 2011-10-26 DIAGNOSIS — D690 Allergic purpura: Secondary | ICD-10-CM | POA: Diagnosis not present

## 2011-10-26 DIAGNOSIS — L719 Rosacea, unspecified: Secondary | ICD-10-CM | POA: Diagnosis not present

## 2011-10-26 DIAGNOSIS — L578 Other skin changes due to chronic exposure to nonionizing radiation: Secondary | ICD-10-CM | POA: Diagnosis not present

## 2011-10-29 NOTE — Transfer of Care (Signed)
Immediate Anesthesia Transfer of Care Note  Patient: [name]  Procedure(s) Performed: Procedure(s) (LRB): HARDWARE REMOVAL (Left)  Patient Location: PACU  Anesthesia Type: General  Level of Consciousness: awake and oriented  Airway & Oxygen Therapy: Patient Spontanous Breathing and Patient connected to face mask oxygen  Post-op Assessment: Report given to PACU RN and Post -op Vital signs reviewed and stable  Post vital signs: Reviewed and stable  Complications: No apparent anesthesia complications

## 2011-11-08 ENCOUNTER — Other Ambulatory Visit: Payer: Self-pay | Admitting: Endocrinology

## 2011-11-08 DIAGNOSIS — R55 Syncope and collapse: Secondary | ICD-10-CM

## 2011-11-09 ENCOUNTER — Ambulatory Visit
Admission: RE | Admit: 2011-11-09 | Discharge: 2011-11-09 | Disposition: A | Discharge status: Home / Self Care | Payer: Medicare Other | Source: Ambulatory Visit | Attending: Endocrinology | Admitting: Endocrinology

## 2011-11-09 DIAGNOSIS — R55 Syncope and collapse: Secondary | ICD-10-CM

## 2011-11-09 DIAGNOSIS — Z853 Personal history of malignant neoplasm of breast: Secondary | ICD-10-CM | POA: Diagnosis not present

## 2011-11-09 MED ORDER — IOHEXOL 300 MG/ML  SOLN
75.0000 mL | Freq: Once | INTRAMUSCULAR | Status: AC | PRN
  Administered 2011-11-09: 75 mL via INTRAVENOUS

## 2011-12-07 DIAGNOSIS — D690 Allergic purpura: Secondary | ICD-10-CM | POA: Diagnosis not present

## 2011-12-10 ENCOUNTER — Ambulatory Visit (HOSPITAL_BASED_OUTPATIENT_CLINIC_OR_DEPARTMENT_OTHER): Payer: Medicare Other | Admitting: Oncology

## 2011-12-10 ENCOUNTER — Other Ambulatory Visit (HOSPITAL_BASED_OUTPATIENT_CLINIC_OR_DEPARTMENT_OTHER): Payer: Medicare Other | Admitting: Lab

## 2011-12-10 ENCOUNTER — Other Ambulatory Visit: Payer: Self-pay | Admitting: *Deleted

## 2011-12-10 VITALS — BP 132/77 | HR 87 | Temp 98.2°F | Resp 20 | Ht 66.0 in | Wt 160.0 lb

## 2011-12-10 DIAGNOSIS — E559 Vitamin D deficiency, unspecified: Secondary | ICD-10-CM | POA: Diagnosis not present

## 2011-12-10 DIAGNOSIS — C50419 Malignant neoplasm of upper-outer quadrant of unspecified female breast: Secondary | ICD-10-CM | POA: Diagnosis not present

## 2011-12-10 DIAGNOSIS — I998 Other disorder of circulatory system: Secondary | ICD-10-CM

## 2011-12-10 DIAGNOSIS — C50919 Malignant neoplasm of unspecified site of unspecified female breast: Secondary | ICD-10-CM

## 2011-12-10 DIAGNOSIS — Z87898 Personal history of other specified conditions: Secondary | ICD-10-CM | POA: Diagnosis not present

## 2011-12-10 DIAGNOSIS — M810 Age-related osteoporosis without current pathological fracture: Secondary | ICD-10-CM

## 2011-12-10 LAB — CBC WITH DIFFERENTIAL/PLATELET
BASO%: 0.7 % (ref 0.0–2.0)
LYMPH%: 24.9 % (ref 14.0–49.7)
MCHC: 33.5 g/dL (ref 31.5–36.0)
MONO#: 0.9 10*3/uL (ref 0.1–0.9)
MONO%: 9.9 % (ref 0.0–14.0)
NEUT#: 5.5 10*3/uL (ref 1.5–6.5)
Platelets: 195 10*3/uL (ref 145–400)
RBC: 4 10*6/uL (ref 3.70–5.45)
RDW: 13.4 % (ref 11.2–14.5)
WBC: 8.7 10*3/uL (ref 3.9–10.3)

## 2011-12-10 LAB — COMPREHENSIVE METABOLIC PANEL
Albumin: 3.6 g/dL (ref 3.5–5.2)
Alkaline Phosphatase: 81 U/L (ref 39–117)
BUN: 20 mg/dL (ref 6–23)
Calcium: 9.4 mg/dL (ref 8.4–10.5)
Sodium: 141 mEq/L (ref 135–145)

## 2011-12-10 MED ORDER — DULOXETINE HCL 60 MG PO CPEP: 60.0000 mg | ORAL_CAPSULE | Freq: Every day | ORAL | Status: DC

## 2011-12-10 NOTE — Telephone Encounter (Signed)
06-09-2012 at 10:00am at breast center   06-08-2012 lab only appointment  06-15-2012 md appointment  Patient is aware of all appointments

## 2011-12-10 NOTE — Progress Notes (Signed)
Hematology and Oncology Follow Up Visit  [facility] [id number] May 13, 1934 77 y.o. 12/10/2011 1:01 PM  Dr Rinaldo Cloud  Principle Diagnosis: 77 yo with hx of [place] breast cancer diagnosed 11/2009, s/p FEC x4 followed by abraxane q week x3/4 ;total of 4 cycles, s/p surgery 09/2010 with residual dcis.; s/p xrtr rt breast completed 12/2010. Complicated by fall and fractured femur (lt), s/p orif. Hx dcis lt breast 1996  Interim History:  Since being seen last the patient is doing well. She has completed rehabilitation and is working at home because of the fractured left femur. There is ongoing pain in her femur. She may require more surgery, was told recently there is a "loose screw" in the appliance that needs to be removed. Takes Cymbalta 30 mg a day with Neurontin to 1800 mg a day for neuropathy. No neuropathy at this time, continues to have discomfort and some numbness. Of note is that she did have some additional surgery. She did not have any excessive bleeding. Last mammogram and bone density April 2013. Mammogram: no evidence of malignancy. Bone Density: Osteoporosis at the femur.   Chief complaint is thin skin and multiple skin tears with bruising, bilateral arms and legs. She states this has been present since initiation of chemotherapy.   Allergies:  Allergies  Allergen Reactions  . Penicillins Anaphylaxis  . Adhesive (Tape) Other (See Comments)    Blisters   . Doxycycline Nausea And Vomiting  . Morphine And Related Other (See Comments)    HALLUCINATIONS    Past Medical History, Surgical history, Social history, and Family History were reviewed and updated.  Physical Exam: Blood pressure 132/77, pulse 87, temperature 98.2 F (36.8 C), temperature source Oral, resp. rate 20, height 5\' 6"  (1.676 m), weight 160 lb (72.576 kg). ECOG:  General: Well developed, well nourished, in no acute distress. Walks with a cane. Accompanied by her daughter.  EENT: No ocular or oral lesions. No  stomatitis.  Respiratory: Lungs are clear to auscultation bilaterally with normal respiratory movement and no accessory muscle use. Cardiac: No murmur, rub or tachycardia. No upper or lower extremity edema.  GI: Abdomen is soft, no palpable hepatosplenomegaly. No fluid wave. No tenderness. Musculoskeletal: No kyphosis, tenderness over the spine, and left hip with palpation.  Lymph: No cervical, infraclavicular, axillary or inguinal adenopathy. Neuro: No focal neurological deficits. Psych: Alert and oriented X 3, appropriate mood and affect.  BREAST EXAM: In the supine position, with the right arm over the head, the right nipple is everted. No periareolar edema or nipple discharge. No mass in any quadrant or subareolar region.Well healed remote scar at the areola margin, laterally.  No redness of the skin. No right axillary adenopathy. With the left arm over the head, the left nipple is surgically absent with central mastectomy curved well healed incision. Left breast is smaller in volume than the right. No mass in any quadrant or subareolar region. No redness of the skin. No left axillary adenopathy.   Extremities multiple ecchymoses and bruises on her lower extremities. Lab Results: Lab Results  Component Value Date   WBC 8.7 12/10/2011   HGB 13.4 12/10/2011   HCT 40.0 12/10/2011   MCV 100.0 12/10/2011   PLT 195 12/10/2011     Chemistry      Component Value Date/Time   NA 142 10/11/2011 1355   K 4.0 10/11/2011 1355   CL 105 10/11/2011 1355   CO2 30 06/10/2011 1205   BUN 16 10/11/2011 1355   CREATININE  0.90 10/11/2011 1355      Component Value Date/Time   CALCIUM 9.2 06/10/2011 1205   ALKPHOS 105 06/10/2011 1205   AST 12 06/10/2011 1205   ALT 9 06/10/2011 1205   BILITOT 0.5 06/10/2011 1205      Impression: 77 year old female with:  1. History of DCIS left breast 1996. 2. More recent invasive right breast cancer, completed chemo and radiation. No evidence of recurrence mammographically  or clinically.  3. Osteoporosis, takes Vit D.  4. Normal mammogram 06/01/11.  5. Released by Dr. Jamey Ripa.  6. Neuropathy, result of chemotherapy.  7. Thin skin with multiple bruises and skin tears, arms and leg.   Plan: Patient is doing fairly well despite all her problems in the past. She is concerned about the easy bruising and ecchymoses on her extremities. I recommended a wound care referral. I will plan to see her back in 6 months time. She appears to be clinically free of recurrence. More than 50% of the visit was spent in patient-related counselling   Jini Horiuchi, NP 10/11/20131:01 PM

## 2011-12-11 LAB — VITAMIN D 25 HYDROXY (VIT D DEFICIENCY, FRACTURES): Vit D, 25-Hydroxy: 49 ng/mL (ref 30–89)

## 2011-12-22 ENCOUNTER — Telehealth: Payer: Self-pay | Admitting: *Deleted

## 2011-12-22 NOTE — Telephone Encounter (Signed)
Patient confirmed over the phone the new date and time on 12-28-2011 at 2:00pm with the wound care at [phone]

## 2011-12-23 DIAGNOSIS — Z23 Encounter for immunization: Secondary | ICD-10-CM | POA: Diagnosis not present

## 2011-12-23 DIAGNOSIS — M76899 Other specified enthesopathies of unspecified lower limb, excluding foot: Secondary | ICD-10-CM | POA: Diagnosis not present

## 2011-12-23 DIAGNOSIS — R569 Unspecified convulsions: Secondary | ICD-10-CM | POA: Diagnosis not present

## 2011-12-28 ENCOUNTER — Encounter (HOSPITAL_BASED_OUTPATIENT_CLINIC_OR_DEPARTMENT_OTHER): Payer: Medicare Other | Attending: General Surgery

## 2011-12-28 DIAGNOSIS — I998 Other disorder of circulatory system: Secondary | ICD-10-CM | POA: Insufficient documentation

## 2011-12-28 DIAGNOSIS — Z853 Personal history of malignant neoplasm of breast: Secondary | ICD-10-CM | POA: Insufficient documentation

## 2011-12-28 DIAGNOSIS — Z9089 Acquired absence of other organs: Secondary | ICD-10-CM | POA: Insufficient documentation

## 2011-12-28 DIAGNOSIS — X58XXXA Exposure to other specified factors, initial encounter: Secondary | ICD-10-CM | POA: Insufficient documentation

## 2011-12-28 DIAGNOSIS — Z90710 Acquired absence of both cervix and uterus: Secondary | ICD-10-CM | POA: Insufficient documentation

## 2011-12-28 DIAGNOSIS — G589 Mononeuropathy, unspecified: Secondary | ICD-10-CM | POA: Insufficient documentation

## 2011-12-28 DIAGNOSIS — S8010XA Contusion of unspecified lower leg, initial encounter: Secondary | ICD-10-CM | POA: Insufficient documentation

## 2011-12-28 DIAGNOSIS — Z923 Personal history of irradiation: Secondary | ICD-10-CM | POA: Insufficient documentation

## 2011-12-28 DIAGNOSIS — S40029A Contusion of unspecified upper arm, initial encounter: Secondary | ICD-10-CM | POA: Insufficient documentation

## 2011-12-28 DIAGNOSIS — T451X5A Adverse effect of antineoplastic and immunosuppressive drugs, initial encounter: Secondary | ICD-10-CM | POA: Insufficient documentation

## 2011-12-28 DIAGNOSIS — M76899 Other specified enthesopathies of unspecified lower limb, excluding foot: Secondary | ICD-10-CM | POA: Diagnosis not present

## 2011-12-28 DIAGNOSIS — T1490XA Injury, unspecified, initial encounter: Secondary | ICD-10-CM | POA: Insufficient documentation

## 2011-12-29 DIAGNOSIS — I998 Other disorder of circulatory system: Secondary | ICD-10-CM | POA: Diagnosis not present

## 2011-12-29 DIAGNOSIS — G589 Mononeuropathy, unspecified: Secondary | ICD-10-CM | POA: Diagnosis not present

## 2011-12-29 DIAGNOSIS — S8010XA Contusion of unspecified lower leg, initial encounter: Secondary | ICD-10-CM | POA: Diagnosis not present

## 2011-12-29 DIAGNOSIS — Z853 Personal history of malignant neoplasm of breast: Secondary | ICD-10-CM | POA: Diagnosis not present

## 2011-12-29 DIAGNOSIS — Z90710 Acquired absence of both cervix and uterus: Secondary | ICD-10-CM | POA: Diagnosis not present

## 2011-12-29 DIAGNOSIS — S40029A Contusion of unspecified upper arm, initial encounter: Secondary | ICD-10-CM | POA: Diagnosis not present

## 2011-12-29 DIAGNOSIS — Z923 Personal history of irradiation: Secondary | ICD-10-CM | POA: Diagnosis not present

## 2011-12-29 DIAGNOSIS — T1490XA Injury, unspecified, initial encounter: Secondary | ICD-10-CM | POA: Diagnosis not present

## 2011-12-29 DIAGNOSIS — Z9089 Acquired absence of other organs: Secondary | ICD-10-CM | POA: Diagnosis not present

## 2011-12-29 NOTE — Progress Notes (Signed)
[facility]  NAME:  [name], [name] NO.:  [id number]  MEDICAL RECORD NO.:  [id number]      DATE OF BIRTH:  June 14, 1934  PHYSICIAN:  Ardath Sax, M.D.           VISIT DATE:                                  OFFICE VISIT   This is a 77 year old lady who comes to the wound clinic for the first time because of multiple wounds all over her body which are mostly ecchymotic areas from insignificant trauma.  She also has several areas where the skin very easily pulls away from minor trauma.  This lady has a history of bilateral breast cancer treated with lumpectomy and radiation, and then followed by chemotherapy.  She has post chemotherapy neuropathy with very, at times, pain in her feet and at other times anesthesia.  Other than that she is fairly healthy.  She does have fibromyalgia.  She has a history of a pulmonary embolus.  She is not on any treatment for that at the present time.  She is only on vitamins, tramadol for pain, and Neurontin.  She has a history of many surgeries including cholecystectomy, hysterectomy, appendectomy, a fractured femur and then of course a breast surgery.  She today was afebrile.  Her blood pressure is 150/90, her pulse is 80, respirations 16, she weighs 157 pounds and she is 5 feet 7 inches.  I went over her and looked at all of these areas.  She has no findings really.  Her abdomen is soft.  Her lungs are clear.  Her heart sounds okay she does not have any edema, but she has got multiple bruises all over her legs and her arms.  These vary anywhere from 0.5 cm to 3 cm.  She has some areas where the skin is actually off of these lesions just from the minor trauma.  I really do not think we will have anything to offer her via hematologist, oncologist center here and she has normal clotting times and apparently her platelets are normal and her protime is normal.  So I wrote a note back to the oncologist that I did not  think the wound clinic would have anything to offer.  In the meantime, I told the patient I thought it would pay her to be extra careful not to traumatize herself because it seems like even though her coag times are normal that she has a tendency for minor trauma for all of these ecchymotic areas.     Ardath Sax, M.D.     PP/MEDQ  D:  12/29/2011  T:  12/29/2011  Job:  [id number]

## 2011-12-31 DIAGNOSIS — M76899 Other specified enthesopathies of unspecified lower limb, excluding foot: Secondary | ICD-10-CM | POA: Diagnosis not present

## 2012-01-04 DIAGNOSIS — M76899 Other specified enthesopathies of unspecified lower limb, excluding foot: Secondary | ICD-10-CM | POA: Diagnosis not present

## 2012-01-06 DIAGNOSIS — M76899 Other specified enthesopathies of unspecified lower limb, excluding foot: Secondary | ICD-10-CM | POA: Diagnosis not present

## 2012-01-11 DIAGNOSIS — M76899 Other specified enthesopathies of unspecified lower limb, excluding foot: Secondary | ICD-10-CM | POA: Diagnosis not present

## 2012-01-14 DIAGNOSIS — M76899 Other specified enthesopathies of unspecified lower limb, excluding foot: Secondary | ICD-10-CM | POA: Diagnosis not present

## 2012-01-14 DIAGNOSIS — C50919 Malignant neoplasm of unspecified site of unspecified female breast: Secondary | ICD-10-CM | POA: Diagnosis not present

## 2012-01-14 DIAGNOSIS — R404 Transient alteration of awareness: Secondary | ICD-10-CM | POA: Diagnosis not present

## 2012-01-14 DIAGNOSIS — G609 Hereditary and idiopathic neuropathy, unspecified: Secondary | ICD-10-CM | POA: Diagnosis not present

## 2012-01-17 DIAGNOSIS — R404 Transient alteration of awareness: Secondary | ICD-10-CM | POA: Diagnosis not present

## 2012-01-18 DIAGNOSIS — M76899 Other specified enthesopathies of unspecified lower limb, excluding foot: Secondary | ICD-10-CM | POA: Diagnosis not present

## 2012-01-21 ENCOUNTER — Other Ambulatory Visit: Payer: Self-pay | Admitting: Neurology

## 2012-01-21 DIAGNOSIS — G609 Hereditary and idiopathic neuropathy, unspecified: Secondary | ICD-10-CM

## 2012-01-21 DIAGNOSIS — C50919 Malignant neoplasm of unspecified site of unspecified female breast: Secondary | ICD-10-CM

## 2012-01-21 DIAGNOSIS — R404 Transient alteration of awareness: Secondary | ICD-10-CM

## 2012-01-24 DIAGNOSIS — M76899 Other specified enthesopathies of unspecified lower limb, excluding foot: Secondary | ICD-10-CM | POA: Diagnosis not present

## 2012-01-25 ENCOUNTER — Encounter (HOSPITAL_BASED_OUTPATIENT_CLINIC_OR_DEPARTMENT_OTHER): Payer: Medicare Other

## 2012-01-25 ENCOUNTER — Ambulatory Visit
Admission: RE | Admit: 2012-01-25 | Discharge: 2012-01-25 | Disposition: A | Discharge status: Home / Self Care | Payer: Medicare Other | Source: Ambulatory Visit | Attending: Neurology | Admitting: Neurology

## 2012-01-25 DIAGNOSIS — C50919 Malignant neoplasm of unspecified site of unspecified female breast: Secondary | ICD-10-CM

## 2012-01-25 DIAGNOSIS — R404 Transient alteration of awareness: Secondary | ICD-10-CM

## 2012-01-25 DIAGNOSIS — G609 Hereditary and idiopathic neuropathy, unspecified: Secondary | ICD-10-CM

## 2012-01-25 DIAGNOSIS — L719 Rosacea, unspecified: Secondary | ICD-10-CM | POA: Diagnosis not present

## 2012-01-25 DIAGNOSIS — D692 Other nonthrombocytopenic purpura: Secondary | ICD-10-CM | POA: Diagnosis not present

## 2012-01-25 MED ORDER — GADOBENATE DIMEGLUMINE 529 MG/ML IV SOLN
13.0000 mL | Freq: Once | INTRAVENOUS | Status: AC | PRN
  Administered 2012-01-25: 13 mL via INTRAVENOUS

## 2012-01-26 DIAGNOSIS — C50919 Malignant neoplasm of unspecified site of unspecified female breast: Secondary | ICD-10-CM | POA: Diagnosis not present

## 2012-01-26 DIAGNOSIS — G609 Hereditary and idiopathic neuropathy, unspecified: Secondary | ICD-10-CM | POA: Diagnosis not present

## 2012-01-26 DIAGNOSIS — R404 Transient alteration of awareness: Secondary | ICD-10-CM | POA: Diagnosis not present

## 2012-02-02 ENCOUNTER — Telehealth: Payer: Self-pay | Admitting: *Deleted

## 2012-02-02 DIAGNOSIS — M76899 Other specified enthesopathies of unspecified lower limb, excluding foot: Secondary | ICD-10-CM | POA: Diagnosis not present

## 2012-02-02 NOTE — Telephone Encounter (Signed)
Mailed out calendar to inform the patient of the new date and time 

## 2012-02-08 DIAGNOSIS — R634 Abnormal weight loss: Secondary | ICD-10-CM | POA: Diagnosis not present

## 2012-02-08 DIAGNOSIS — Z86010 Personal history of colonic polyps: Secondary | ICD-10-CM | POA: Diagnosis not present

## 2012-02-08 DIAGNOSIS — K5289 Other specified noninfective gastroenteritis and colitis: Secondary | ICD-10-CM | POA: Diagnosis not present

## 2012-02-09 DIAGNOSIS — R569 Unspecified convulsions: Secondary | ICD-10-CM | POA: Diagnosis not present

## 2012-02-09 DIAGNOSIS — K5289 Other specified noninfective gastroenteritis and colitis: Secondary | ICD-10-CM | POA: Diagnosis not present

## 2012-02-09 DIAGNOSIS — D649 Anemia, unspecified: Secondary | ICD-10-CM | POA: Diagnosis not present

## 2012-02-09 DIAGNOSIS — E785 Hyperlipidemia, unspecified: Secondary | ICD-10-CM | POA: Diagnosis not present

## 2012-02-17 ENCOUNTER — Other Ambulatory Visit: Payer: Self-pay | Admitting: Gastroenterology

## 2012-02-17 DIAGNOSIS — Z86010 Personal history of colonic polyps: Secondary | ICD-10-CM | POA: Diagnosis not present

## 2012-02-17 DIAGNOSIS — R197 Diarrhea, unspecified: Secondary | ICD-10-CM | POA: Diagnosis not present

## 2012-02-17 DIAGNOSIS — K5289 Other specified noninfective gastroenteritis and colitis: Secondary | ICD-10-CM | POA: Diagnosis not present

## 2012-02-18 ENCOUNTER — Telehealth: Payer: Self-pay | Admitting: *Deleted

## 2012-02-18 NOTE — Telephone Encounter (Signed)
Informed pt that Dr. Donnie Coffin will be leaving practice at the end of the year.  Received verbal understanding.  Informed pt that she will be r/s with Dr. Welton Flakes or Dr. Darnelle Catalan for continued f/u care.

## 2012-02-22 ENCOUNTER — Encounter (HOSPITAL_COMMUNITY): Payer: Self-pay | Admitting: Emergency Medicine

## 2012-02-22 ENCOUNTER — Emergency Department (HOSPITAL_COMMUNITY): Payer: Medicare Other

## 2012-02-22 ENCOUNTER — Emergency Department (HOSPITAL_COMMUNITY)
Admission: EM | Admit: 2012-02-22 | Discharge: 2012-02-23 | Disposition: A | Discharge status: Home / Self Care | Payer: Medicare Other | Attending: Emergency Medicine | Admitting: Emergency Medicine

## 2012-02-22 DIAGNOSIS — J309 Allergic rhinitis, unspecified: Secondary | ICD-10-CM | POA: Diagnosis not present

## 2012-02-22 DIAGNOSIS — Z87891 Personal history of nicotine dependence: Secondary | ICD-10-CM | POA: Insufficient documentation

## 2012-02-22 DIAGNOSIS — Z79899 Other long term (current) drug therapy: Secondary | ICD-10-CM | POA: Insufficient documentation

## 2012-02-22 DIAGNOSIS — R112 Nausea with vomiting, unspecified: Secondary | ICD-10-CM

## 2012-02-22 DIAGNOSIS — Z8719 Personal history of other diseases of the digestive system: Secondary | ICD-10-CM | POA: Diagnosis not present

## 2012-02-22 DIAGNOSIS — E785 Hyperlipidemia, unspecified: Secondary | ICD-10-CM | POA: Insufficient documentation

## 2012-02-22 DIAGNOSIS — Z862 Personal history of diseases of the blood and blood-forming organs and certain disorders involving the immune mechanism: Secondary | ICD-10-CM | POA: Diagnosis not present

## 2012-02-22 DIAGNOSIS — M810 Age-related osteoporosis without current pathological fracture: Secondary | ICD-10-CM | POA: Insufficient documentation

## 2012-02-22 DIAGNOSIS — Z86711 Personal history of pulmonary embolism: Secondary | ICD-10-CM | POA: Diagnosis not present

## 2012-02-22 DIAGNOSIS — E876 Hypokalemia: Secondary | ICD-10-CM | POA: Diagnosis not present

## 2012-02-22 DIAGNOSIS — R197 Diarrhea, unspecified: Secondary | ICD-10-CM | POA: Diagnosis not present

## 2012-02-22 DIAGNOSIS — Z87898 Personal history of other specified conditions: Secondary | ICD-10-CM | POA: Insufficient documentation

## 2012-02-22 DIAGNOSIS — Z8669 Personal history of other diseases of the nervous system and sense organs: Secondary | ICD-10-CM | POA: Diagnosis not present

## 2012-02-22 DIAGNOSIS — Z86718 Personal history of other venous thrombosis and embolism: Secondary | ICD-10-CM | POA: Insufficient documentation

## 2012-02-22 DIAGNOSIS — R404 Transient alteration of awareness: Secondary | ICD-10-CM | POA: Diagnosis not present

## 2012-02-22 DIAGNOSIS — R5383 Other fatigue: Secondary | ICD-10-CM | POA: Diagnosis not present

## 2012-02-22 DIAGNOSIS — Z853 Personal history of malignant neoplasm of breast: Secondary | ICD-10-CM | POA: Insufficient documentation

## 2012-02-22 DIAGNOSIS — Z8679 Personal history of other diseases of the circulatory system: Secondary | ICD-10-CM | POA: Insufficient documentation

## 2012-02-22 DIAGNOSIS — G589 Mononeuropathy, unspecified: Secondary | ICD-10-CM | POA: Insufficient documentation

## 2012-02-22 DIAGNOSIS — R109 Unspecified abdominal pain: Secondary | ICD-10-CM

## 2012-02-22 DIAGNOSIS — K7689 Other specified diseases of liver: Secondary | ICD-10-CM | POA: Diagnosis not present

## 2012-02-22 HISTORY — DX: Unspecified convulsions: R56.9

## 2012-02-22 LAB — URINALYSIS, ROUTINE W REFLEX MICROSCOPIC
Nitrite: NEGATIVE
Urobilinogen, UA: 0.2 mg/dL (ref 0.0–1.0)

## 2012-02-22 LAB — COMPREHENSIVE METABOLIC PANEL
ALT: 42 U/L — ABNORMAL HIGH (ref 0–35)
AST: 35 U/L (ref 0–37)
Alkaline Phosphatase: 81 U/L (ref 39–117)
CO2: 23 mEq/L (ref 19–32)
Chloride: 101 mEq/L (ref 96–112)
GFR calc Af Amer: 76 mL/min — ABNORMAL LOW (ref >90)
GFR calc non Af Amer: 65 mL/min — ABNORMAL LOW (ref >90)
Glucose, Bld: 87 mg/dL (ref 70–99)
Potassium: 2.6 mEq/L — CL (ref 3.5–5.1)
Sodium: 137 mEq/L (ref 135–145)

## 2012-02-22 LAB — CBC
Hemoglobin: 15.3 g/dL — ABNORMAL HIGH (ref 12.0–15.0)
Platelets: 215 10*3/uL (ref 150–400)
RBC: 4.79 MIL/uL (ref 3.87–5.11)
WBC: 9.4 10*3/uL (ref 4.0–10.5)

## 2012-02-22 LAB — URINE MICROSCOPIC-ADD ON

## 2012-02-22 LAB — POTASSIUM: Potassium: 2.9 mEq/L — ABNORMAL LOW (ref 3.5–5.1)

## 2012-02-22 MED ORDER — POTASSIUM CHLORIDE 10 MEQ/100ML IV SOLN
10.0000 meq | Freq: Once | INTRAVENOUS | Status: AC
  Administered 2012-02-22: 10 meq via INTRAVENOUS
  Filled 2012-02-22: qty 100

## 2012-02-22 MED ORDER — IOHEXOL 300 MG/ML  SOLN
100.0000 mL | Freq: Once | INTRAMUSCULAR | Status: AC | PRN
  Administered 2012-02-22: 100 mL via INTRAVENOUS

## 2012-02-22 MED ORDER — POTASSIUM CHLORIDE 10 MEQ/100ML IV SOLN
10.0000 meq | Freq: Once | INTRAVENOUS | Status: AC
  Administered 2012-02-22: 10 meq via INTRAVENOUS

## 2012-02-22 MED ORDER — HYDROMORPHONE HCL PF 1 MG/ML IJ SOLN
1.0000 mg | Freq: Once | INTRAMUSCULAR | Status: AC
  Administered 2012-02-22: 1 mg via INTRAVENOUS
  Filled 2012-02-22: qty 1

## 2012-02-22 MED ORDER — LOPERAMIDE HCL 2 MG PO CAPS
4.0000 mg | ORAL_CAPSULE | Freq: Once | ORAL | Status: AC
  Administered 2012-02-22: 4 mg via ORAL
  Filled 2012-02-22: qty 2

## 2012-02-22 MED ORDER — SODIUM CHLORIDE 0.9 % IV SOLN
Freq: Once | INTRAVENOUS | Status: AC
  Administered 2012-02-22: 19:00:00 via INTRAVENOUS

## 2012-02-22 MED ORDER — METOCLOPRAMIDE HCL 5 MG/ML IJ SOLN
10.0000 mg | Freq: Once | INTRAMUSCULAR | Status: AC
  Administered 2012-02-22: 10 mg via INTRAVENOUS
  Filled 2012-02-22: qty 2

## 2012-02-22 MED ORDER — ONDANSETRON HCL 4 MG/2ML IJ SOLN
4.0000 mg | Freq: Once | INTRAMUSCULAR | Status: AC
  Administered 2012-02-22: 4 mg via INTRAVENOUS
  Filled 2012-02-22: qty 2

## 2012-02-22 MED ORDER — POTASSIUM CHLORIDE 10 MEQ/100ML IV SOLN
10.0000 meq | Freq: Once | INTRAVENOUS | Status: AC
  Administered 2012-02-22: 10 meq via INTRAVENOUS
  Filled 2012-02-22 (×2): qty 100

## 2012-02-22 MED ORDER — POTASSIUM CHLORIDE CRYS ER 20 MEQ PO TBCR
40.0000 meq | EXTENDED_RELEASE_TABLET | Freq: Once | ORAL | Status: AC
  Administered 2012-02-22: 40 meq via ORAL
  Filled 2012-02-22: qty 2

## 2012-02-22 MED ORDER — SODIUM CHLORIDE 0.9 % IV BOLUS (SEPSIS)
1000.0000 mL | Freq: Once | INTRAVENOUS | Status: AC
  Administered 2012-02-22: 1000 mL via INTRAVENOUS

## 2012-02-22 MED ORDER — DIPHENHYDRAMINE HCL 50 MG/ML IJ SOLN
25.0000 mg | Freq: Once | INTRAMUSCULAR | Status: AC
  Administered 2012-02-22: 25 mg via INTRAVENOUS
  Filled 2012-02-22: qty 1

## 2012-02-22 NOTE — ED Notes (Signed)
Notified Tonny Bollman, PA and Roselyn Bering critical K+ 2.6

## 2012-02-22 NOTE — ED Provider Notes (Signed)
Patient has been given IV hydration and IV antiemetics with slight improvement. However, she hasn't vomited again following her CT scan. Potassium is come back very low and she is being given intravenous potassium. On exam, abdomen is soft with mild tenderness diffusely. No focal tenderness. Laboratory workup is otherwise unremarkable with a normal x-rays and normal renal function. She will be given metoclopramide for nausea and loperamide for diarrhea-will need to be rechecked following IV potassium runs.  Potassium is only 12-2.9. She's given additional runs of potassium. Patient stated she was not comfortable going home late at night and requested to be admitted. I discussed case with her PCP, Dr. Adria Devon, who did not feel that admission would be appropriate. Ask him as a patient who is in agreement. She is given 2 more runs of potassium as well as oral potassium. Dr. Adria Devon will follow her up in his office in 3 days. She will be sent home with prescription for Zofran for nausea.   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 When compared with ECG of 12/28/2010, inferior wall myocardial infarction as a now present, low voltage is now present.  Old EKG Reviewed: changes noted  Results for orders placed during the hospital encounter of 02/22/12  CBC      Component Value Range   WBC 9.4  4.0 - 10.5 K/uL   RBC 4.79  3.87 - 5.11 MIL/uL   Hemoglobin 15.3 (*) 12.0 - 15.0 g/dL   HCT 16.1  09.6 - 04.5 %   MCV 93.9  78.0 - 100.0 fL   MCH 31.9  26.0 - 34.0 pg   MCHC 34.0  30.0 - 36.0 g/dL   RDW 40.9  81.1 - 91.4 %   Platelets 215  150 - 400 K/uL  COMPREHENSIVE METABOLIC PANEL      Component Value Range   Sodium 137  135 - 145 mEq/L   Potassium 2.6 (*) 3.5 - 5.1 mEq/L   Chloride 101  96 - 112  mEq/L   CO2 23  19 - 32 mEq/L   Glucose, Bld 87  70 - 99 mg/dL   BUN 16  6 - 23 mg/dL   Creatinine, Ser 7.82  0.50 - 1.10 mg/dL   Calcium 9.2  8.4 - 95.6 mg/dL   Total Protein 6.7  6.0 - 8.3 g/dL   Albumin 3.3 (*) 3.5 - 5.2 g/dL   AST 35  0 - 37 U/L   ALT 42 (*) 0 - 35 U/L   Alkaline Phosphatase 81  39 - 117 U/L   Total Bilirubin 0.4  0.3 - 1.2 mg/dL   GFR calc non Af Amer 65 (*) >90 mL/min   GFR calc Af Amer 76 (*) >90 mL/min  URINALYSIS, ROUTINE W REFLEX MICROSCOPIC      Component Value Range   Color, Urine AMBER (*) YELLOW   APPearance CLEAR  CLEAR   Specific Gravity, Urine 1.026  1.005 - 1.030   pH 6.0  5.0 - 8.0   Glucose, UA NEGATIVE  NEGATIVE mg/dL   Hgb urine dipstick TRACE (*) NEGATIVE   Bilirubin Urine SMALL (*) NEGATIVE   Ketones, ur TRACE (*) NEGATIVE mg/dL   Protein, ur NEGATIVE  NEGATIVE mg/dL   Urobilinogen, UA 0.2  0.0 - 1.0 mg/dL   Nitrite NEGATIVE  NEGATIVE   Leukocytes,  UA NEGATIVE  NEGATIVE  TROPONIN I      Component Value Range   Troponin I <0.30  <0.30 ng/mL  URINE MICROSCOPIC-ADD ON      Component Value Range   RBC / HPF 0-2  <3 RBC/hpf   Urine-Other MUCOUS PRESENT     Ct Abdomen Pelvis W Contrast  02/22/2012  *RADIOLOGY REPORT*  Clinical Data: Lower abdominal pain, history of colitis, status post colonoscopy  CT ABDOMEN AND PELVIS WITH CONTRAST  Technique:  Multidetector CT imaging of the abdomen and pelvis was performed following the standard protocol during bolus administration of intravenous contrast.  Contrast: OMNIPAQUE IOHEXOL 300 MG/ML  SOLN  Comparison: 12/22/2010  Findings: Mild emphysematous changes are noted lung bases. Sagittal images of the spine shows osteopenia and degenerative changes thoracolumbar spine.  Interval mild compression deformity of T11 and T12 vertebral body of indeterminate age.  No bony protrusion in spinal canal.  No definite evidence of acute fracture.  Few hepatic cysts are stable from prior exam.  Mild intrahepatic  biliary ductal dilatation.  CBD measures 9 mm in diameter. The patient is status post cholecystectomy.  Small hiatal hernia again noted.  Atherosclerotic calcifications of the abdominal aorta, SMA, iliac arteries and left renal artery origin.  No aortic aneurysm. Enhanced pancreas, spleen and adrenal glands are unremarkable. Enhanced kidneys are symmetrical in size.  No hydronephrosis or hydroureter.  Tiny right renal cyst measures 4 mm.  Delayed renal images shows bilateral renal symmetrical excretion. A duplicated proximal left ureter is noted.  No small bowel obstruction.  No ascites or free air.  No adenopathy.  Leak with noted within colon.  No colonic obstruction. The terminal ileum is unremarkable.  No pericecal inflammation. The urinary bladder is unremarkable.  The patient is status post hysterectomy.  The appendix is surgically absent.  Metallic fixation rods and pin is noted in proximal left femur.  Post injection granulomas noted bilateral subcutaneous gluteal region.  IMPRESSION:  1.  No acute inflammatory process within abdomen or pelvis. 2.  Mild intrahepatic and extrahepatic biliary ductal dilatation. 3.  No hydronephrosis or hydroureter. 4.  Interval mild compression deformities T11 and T12 vertebral body without definite evidence of acute fracture.  5.  Status post appendectomy.  Status post hysterectomy.   Original Report Authenticated By: Natasha Mead, M.D.    Dg Abd Acute W/chest  02/22/2012  *RADIOLOGY REPORT*  Clinical Data: 77 year old female with abdominal pain and diarrhea. Colonoscopy last week.  ACUTE ABDOMEN SERIES (ABDOMEN 2 VIEW & CHEST 1 VIEW)  Comparison: Chest radiographs 12/23/2010. CT abdomen 12/22/2010. CT abdomen and pelvis 01/20/2010.  Findings: AP semi upright view of the chest.  Postoperative changes to the left axilla and chest wall.  Lower lung volumes.  Chronic elevation of the right hemidiaphragm.  Mild patchy opacity at the right base, favor atelectasis.  L1 compression  fracture suspected and new since 2011. Nonobstructed bowel gas pattern.  Chronic calcified injection granulomas.  No pneumoperitoneum.  Postoperative changes to the left proximal femur partially visible.  IMPRESSION: 1. Nonobstructed bowel gas pattern, no free air. 2.  Lower lung volumes.  Chronic elevation of the right hemidiaphragm with adjacent patchy opacity, favor atelectasis. 3.  L1 compression fracture, favor chronic but new since 2012.   Original Report Authenticated By: Erskine Speed, M.D.       Dione Booze, MD 02/23/12 [phone]

## 2012-02-22 NOTE — ED Notes (Signed)
ZOX:[id number]<AV> Expected date:<BR> Expected time:<BR> Means of arrival:<BR> Comments:<BR> EMS Weakness post colonoscopy

## 2012-02-22 NOTE — ED Provider Notes (Signed)
History     CSN: [id number]  Arrival date & time 02/22/12  1302   First MD Initiated Contact with Patient 02/22/12 1401      Chief Complaint  Patient presents with  . Weakness    (Consider location/radiation/quality/duration/timing/severity/associated sxs/prior treatment) HPI Pt presents with c/o generalized weakness as well as nausea/vomiting and diarrhea.  She states she started feeling poorly due to doing the prep for her colonoscopy which was done 5 days ago.  Since that time she has had intermittent vomiting (none today), and watery stools.  Today she had an episode of lower abdominal pain- described as sharp and cramping in nature.  This has since decreased.  No blood in stool.  No fever/chills.  D/w Dr. Lorain Childes, on call for Dr. [name] who called in zofran 3 days ago- this has been helping somewhat but she still continues to have nausea and has not been able to keep down much po intake.  There are no other associated systemic symptoms, there are no other alleviating or modifying factors.   Past Medical History  Diagnosis Date  . IBS (irritable bowel syndrome)   . Fibromyalgia   . History of DVT (deep vein thrombosis)   . History of pulmonary embolism   . Osteoporosis   . History of breast cancer LEFT BREAST DCIS  1996  . Hyperlipidemia   . 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 Breast cancer RIGHT SIDE-- DX OCT 2011    CHEMORADIATION THERAPY-- COMPLETED   . PONV (postoperative nausea and vomiting)   . Skin tear LEFT ARM COVERED W/ TEGADERM    PT STATES HX MULTIPLE SKIN TEARS -- THIN  . Thin skin SECONARDY AGE AND HX CHEMO  . Ecchymosis   . Seasonal allergies   . Frequency of urination   . Nocturia   . Anemia   . Lung mass PER DR CLANCE NOTE UNCLEAR IF LYMPHOMA FROM BX    FOLLOWED BY DR RUBIN  . Seizures     Past Surgical History  Procedure Date  . Total abdominal hysterectomy w/ bilateral salpingoophorectomy 1977  (APPROX)   . Cholecystectomy   . Appendectomy   . Femur fracture surgery 12-18-2010  DR BEANE    INTRAMEDULLARY NAILING LEFT INTERTROCHANTERIC -SUBTROCHANTERIC FX  . Right breast lumpectomy / removal lymph nodes x2/ removal pac 10-01-2010    CARCINOMA RIGHT BREAST  . Placement port- a- cath 02-18-2010  . Bronchoscopy 01-29-2010    W/ BX  . Tvt vaginal tape  suburethral sling  06-08-2005    SUI  . Bilateral laminectomy/ foraminotomy 01-14-2004    L4 - L5  . Breast lumpectomy 1996     LEFT BREAST DCIS   . Cataract extraction w/ intraocular lens  implant, bilateral   . Bilateral carpal tunnel release   . Tonsillectomy   . Transthoracic echocardiogram 02-25-2010    LVSF NORMAL/ EF 55-60%/ GRADE I DIASTOLIC DYSFUNCTION/ MILDLY DILATED LEFT ATRIUM  . Hardware removal 10/11/2011    Procedure: HARDWARE REMOVAL;  Surgeon: Javier Docker, MD;  Location: [facility];  Service: Orthopedics;  Laterality: Left;  LEFT THIGH REMOVAL OF DISTAL LOCKING SCREW NEEDS: FLORO, RADIO LUSCENT TABLE AND SCREWDRIVER FOR BIOMET ROD   . Colonoscopy     Family History  Problem Relation Age of Onset  . Emphysema Father   . Ovarian cancer Mother   . Cancer Mother     cervical  . Breast cancer Maternal Grandmother   .  Cancer Sister     cervical    History  Substance Use Topics  . Smoking status: Former Smoker -- 1.0 packs/day for 30 years    Types: Cigarettes    Quit date: 03/01/1978  . Smokeless tobacco: Never Used  . Alcohol Use: Yes     Comment: OCCASIONAL    OB History    Grav Para Term Preterm Abortions TAB SAB Ect Mult Living                  Review of Systems ROS reviewed and all otherwise negative except for mentioned in HPI  Allergies  Penicillins; Adhesive; Doxycycline; and Morphine and related  Home Medications   Current Outpatient Rx  Name  Route  Sig  Dispense  Refill  . VITAMIN C 1000 MG PO TABS   Oral   Take 1,000 mg by mouth daily.         . BUDESONIDE 9 MG  PO TB24   Oral   Take 1 tablet by mouth daily.         . DEXLANSOPRAZOLE 60 MG PO CPDR   Oral   Take 60 mg by mouth daily.         . DULOXETINE HCL 60 MG PO CPEP   Oral   Take 1 capsule (60 mg total) by mouth daily.   90 capsule   1   . ERGOCALCIFEROL 50000 UNITS PO CAPS   Oral   Take 50,000 Units by mouth once a week. tuesday         . EZETIMIBE-SIMVASTATIN 10-80 MG PO TABS   Oral   Take 1 tablet by mouth at bedtime.          Marland Kitchen GABAPENTIN 100 MG PO CAPS   Oral   Take 2-3 capsules (200-300 mg total) by mouth 3 (three) times daily.   810 capsule   3   . LEVETIRACETAM 250 MG PO TABS   Oral   Take 250 mg by mouth every 12 (twelve) hours.         Marland Kitchen LEVOTHYROXINE SODIUM 88 MCG PO TABS   Oral   Take 88 mcg by mouth every morning.          Marland Kitchen LORAZEPAM 0.5 MG PO TABS   Oral   Take 0.5 mg by mouth every 8 (eight) hours as needed. Anxiety          . MELOXICAM 15 MG PO TABS   Oral   Take 15 mg by mouth daily as needed. pain         . METRONIDAZOLE 1 % EX GEL   Topical   Apply 1 application topically daily.         Marland Kitchen MIDODRINE HCL 5 MG PO TABS   Oral   Take 5 mg by mouth 2 (two) times daily.         Marland Kitchen VITAMIN E 400 UNITS PO CAPS   Oral   Take 400 Units by mouth daily.         Marland Kitchen ONDANSETRON HCL 4 MG PO TABS   Oral   Take 1 tablet (4 mg total) by mouth every 6 (six) hours.   20 tablet   0   . POTASSIUM CHLORIDE CRYS ER 20 MEQ PO TBCR   Oral   Take 1 tablet (20 mEq total) by mouth daily.   30 tablet   1   . POTASSIUM CHLORIDE CRYS ER 20 MEQ PO TBCR   Oral  Take 1 tablet (20 mEq total) by mouth 2 (two) times daily.   30 tablet   0     BP 109/48  Pulse 75  Temp 98.7 F (37.1 C) (Oral)  Resp 15  SpO2 99% Vitals reviewed Physical Exam Physical Examination: General appearance - alert, chronically ill appearing, and in no distress Mental status - alert, oriented to person, place, and time Eyes - no conjunctival injection, no  scleral icterus Mouth - mucous membranes tacky, OP without lesions Chest - clear to auscultation, no wheezes, rales or rhonchi, symmetric air entry Heart - normal rate, regular rhythm, normal S1, S2, no murmurs, rubs, clicks or gallops Abdomen - soft, ttp in lower abdomen, no gaurding or rebound, nondistended, no masses or organomegaly Extremities - peripheral pulses normal, no pedal edema, no clubbing or cyanosis Skin - normal coloration and turgor, no rashes  ED Course  Procedures (including critical care time)  Labs Reviewed  CBC - Abnormal; Notable for the following:    Hemoglobin 15.3 (*)     All other components within normal limits  COMPREHENSIVE METABOLIC PANEL - Abnormal; Notable for the following:    Potassium 2.6 (*)     Albumin 3.3 (*)     ALT 42 (*)     GFR calc non Af Amer 65 (*)     GFR calc Af Amer 76 (*)     All other components within normal limits  URINALYSIS, ROUTINE W REFLEX MICROSCOPIC - Abnormal; Notable for the following:    Color, Urine AMBER (*)  BIOCHEMICALS MAY BE AFFECTED BY COLOR   Hgb urine dipstick TRACE (*)     Bilirubin Urine SMALL (*)     Ketones, ur TRACE (*)     All other components within normal limits  POTASSIUM - Abnormal; Notable for the following:    Potassium 2.9 (*)     All other components within normal limits  TROPONIN I  URINE MICROSCOPIC-ADD ON  POTASSIUM  LAB REPORT - SCANNED   Ct Abdomen Pelvis W Contrast  02/22/2012  *RADIOLOGY REPORT*  Clinical Data: Lower abdominal pain, history of colitis, status post colonoscopy  CT ABDOMEN AND PELVIS WITH CONTRAST  Technique:  Multidetector CT imaging of the abdomen and pelvis was performed following the standard protocol during bolus administration of intravenous contrast.  Contrast: OMNIPAQUE IOHEXOL 300 MG/ML  SOLN  Comparison: 12/22/2010  Findings: Mild emphysematous changes are noted lung bases. Sagittal images of the spine shows osteopenia and degenerative changes thoracolumbar  spine.  Interval mild compression deformity of T11 and T12 vertebral body of indeterminate age.  No bony protrusion in spinal canal.  No definite evidence of acute fracture.  Few hepatic cysts are stable from prior exam.  Mild intrahepatic biliary ductal dilatation.  CBD measures 9 mm in diameter. The patient is status post cholecystectomy.  Small hiatal hernia again noted.  Atherosclerotic calcifications of the abdominal aorta, SMA, iliac arteries and left renal artery origin.  No aortic aneurysm. Enhanced pancreas, spleen and adrenal glands are unremarkable. Enhanced kidneys are symmetrical in size.  No hydronephrosis or hydroureter.  Tiny right renal cyst measures 4 mm.  Delayed renal images shows bilateral renal symmetrical excretion. A duplicated proximal left ureter is noted.  No small bowel obstruction.  No ascites or free air.  No adenopathy.  Leak with noted within colon.  No colonic obstruction. The terminal ileum is unremarkable.  No pericecal inflammation. The urinary bladder is unremarkable.  The patient is status post hysterectomy.  The appendix is surgically absent.  Metallic fixation rods and pin is noted in proximal left femur.  Post injection granulomas noted bilateral subcutaneous gluteal region.  IMPRESSION:  1.  No acute inflammatory process within abdomen or pelvis. 2.  Mild intrahepatic and extrahepatic biliary ductal dilatation. 3.  No hydronephrosis or hydroureter. 4.  Interval mild compression deformities T11 and T12 vertebral body without definite evidence of acute fracture.  5.  Status post appendectomy.  Status post hysterectomy.   Original Report Authenticated By: Natasha Mead, M.D.    Dg Abd Acute W/chest  02/22/2012  *RADIOLOGY REPORT*  Clinical Data: 77 year old female with abdominal pain and diarrhea. Colonoscopy last week.  ACUTE ABDOMEN SERIES (ABDOMEN 2 VIEW & CHEST 1 VIEW)  Comparison: Chest radiographs 12/23/2010. CT abdomen 12/22/2010. CT abdomen and pelvis 01/20/2010.   Findings: AP semi upright view of the chest.  Postoperative changes to the left axilla and chest wall.  Lower lung volumes.  Chronic elevation of the right hemidiaphragm.  Mild patchy opacity at the right base, favor atelectasis.  L1 compression fracture suspected and new since 2011. Nonobstructed bowel gas pattern.  Chronic calcified injection granulomas.  No pneumoperitoneum.  Postoperative changes to the left proximal femur partially visible.  IMPRESSION: 1. Nonobstructed bowel gas pattern, no free air. 2.  Lower lung volumes.  Chronic elevation of the right hemidiaphragm with adjacent patchy opacity, favor atelectasis. 3.  L1 compression fracture, favor chronic but new since 2012.   Original Report Authenticated By: Erskine Speed, M.D.      1. Abdominal pain   2. Hypokalemia   3. Diarrhea   4. Nausea & vomiting       MDM  Pt presenst with c/o nausea/vomiting/diarrhea, fatigue and abdominal pain which has been ongoing since starting a prep for her colonoscopy several days ago.  Today the lower abdominal pain worsened.  Pt will be hydrated, given meds for nausea and pain, labs and urinalysis obtained.  Acute abdominal series will be obtained to r/o free air.  Will also obtain abdominal CT scan to evaluate for possible colitis or other intraabdominal pathology.          Ethelda Chick, MD 02/23/12 [phone]

## 2012-02-22 NOTE — ED Notes (Addendum)
Pt in from home c/o weakness post -colonoscopy on Thursday. Pt reports constant diarrhea, pt has hx of IBS. Pt also c/o tremors

## 2012-02-23 MED ORDER — ONDANSETRON HCL 4 MG PO TABS: 4.0000 mg | ORAL_TABLET | Freq: Four times a day (QID) | ORAL | Status: DC

## 2012-02-23 MED ORDER — POTASSIUM CHLORIDE CRYS ER 20 MEQ PO TBCR: 20.0000 meq | EXTENDED_RELEASE_TABLET | Freq: Two times a day (BID) | ORAL | Status: DC

## 2012-02-23 NOTE — ED Provider Notes (Signed)
Patient left to me at change of shift to check her potassium level. Patient reports she had a recent colonoscopy in the having nausea since then. She reports she also feels weak.  Results for orders placed during the hospital encounter of 02/22/12  POTASSIUM      Component Value Range   Potassium 2.9 (*) 3.5 - 5.1 mEq/L  POTASSIUM      Component Value Range   Potassium 3.8  3.5 - 5.1 mEq/L   Plan discharge  Devoria Albe, MD, Franz Dell, MD 02/23/12 [phone]

## 2012-02-25 DIAGNOSIS — K5289 Other specified noninfective gastroenteritis and colitis: Secondary | ICD-10-CM | POA: Diagnosis not present

## 2012-02-25 DIAGNOSIS — E876 Hypokalemia: Secondary | ICD-10-CM | POA: Diagnosis not present

## 2012-02-25 DIAGNOSIS — I10 Essential (primary) hypertension: Secondary | ICD-10-CM | POA: Diagnosis not present

## 2012-02-25 DIAGNOSIS — K219 Gastro-esophageal reflux disease without esophagitis: Secondary | ICD-10-CM | POA: Diagnosis not present

## 2012-03-02 ENCOUNTER — Other Ambulatory Visit: Payer: Self-pay | Admitting: Dietician

## 2012-03-02 ENCOUNTER — Other Ambulatory Visit (HOSPITAL_COMMUNITY): Payer: Self-pay | Admitting: Gastroenterology

## 2012-03-02 DIAGNOSIS — R948 Abnormal results of function studies of other organs and systems: Secondary | ICD-10-CM

## 2012-03-12 DIAGNOSIS — N289 Disorder of kidney and ureter, unspecified: Secondary | ICD-10-CM | POA: Diagnosis not present

## 2012-03-12 DIAGNOSIS — D649 Anemia, unspecified: Secondary | ICD-10-CM | POA: Diagnosis not present

## 2012-03-12 DIAGNOSIS — E860 Dehydration: Secondary | ICD-10-CM | POA: Diagnosis not present

## 2012-03-12 DIAGNOSIS — K922 Gastrointestinal hemorrhage, unspecified: Secondary | ICD-10-CM | POA: Diagnosis not present

## 2012-03-12 DIAGNOSIS — R52 Pain, unspecified: Secondary | ICD-10-CM | POA: Diagnosis not present

## 2012-03-13 DIAGNOSIS — R52 Pain, unspecified: Secondary | ICD-10-CM | POA: Diagnosis not present

## 2012-03-13 DIAGNOSIS — K922 Gastrointestinal hemorrhage, unspecified: Secondary | ICD-10-CM | POA: Diagnosis not present

## 2012-03-13 DIAGNOSIS — N289 Disorder of kidney and ureter, unspecified: Secondary | ICD-10-CM | POA: Diagnosis not present

## 2012-03-13 DIAGNOSIS — E860 Dehydration: Secondary | ICD-10-CM | POA: Diagnosis not present

## 2012-03-13 DIAGNOSIS — D649 Anemia, unspecified: Secondary | ICD-10-CM | POA: Diagnosis not present

## 2012-03-14 ENCOUNTER — Ambulatory Visit (HOSPITAL_COMMUNITY)
Admission: RE | Admit: 2012-03-14 | Discharge: 2012-03-14 | Disposition: A | Discharge status: Home / Self Care | Payer: Medicare Other | Source: Ambulatory Visit | Attending: Gastroenterology | Admitting: Gastroenterology

## 2012-03-14 DIAGNOSIS — S2249XA Multiple fractures of ribs, unspecified side, initial encounter for closed fracture: Secondary | ICD-10-CM | POA: Diagnosis not present

## 2012-03-14 DIAGNOSIS — R948 Abnormal results of function studies of other organs and systems: Secondary | ICD-10-CM

## 2012-03-14 DIAGNOSIS — J320 Chronic maxillary sinusitis: Secondary | ICD-10-CM | POA: Insufficient documentation

## 2012-03-14 DIAGNOSIS — C50919 Malignant neoplasm of unspecified site of unspecified female breast: Secondary | ICD-10-CM | POA: Diagnosis not present

## 2012-03-14 DIAGNOSIS — X58XXXA Exposure to other specified factors, initial encounter: Secondary | ICD-10-CM | POA: Insufficient documentation

## 2012-03-14 MED ORDER — FLUDEOXYGLUCOSE F - 18 (FDG) INJECTION
18.3000 | Freq: Once | INTRAVENOUS | Status: AC | PRN
  Administered 2012-03-14: 18.3 via INTRAVENOUS

## 2012-03-21 DIAGNOSIS — E876 Hypokalemia: Secondary | ICD-10-CM | POA: Diagnosis not present

## 2012-04-05 ENCOUNTER — Other Ambulatory Visit: Payer: Self-pay | Admitting: *Deleted

## 2012-04-05 DIAGNOSIS — C50919 Malignant neoplasm of unspecified site of unspecified female breast: Secondary | ICD-10-CM

## 2012-04-05 DIAGNOSIS — M76899 Other specified enthesopathies of unspecified lower limb, excluding foot: Secondary | ICD-10-CM | POA: Diagnosis not present

## 2012-04-05 MED ORDER — LORAZEPAM 0.5 MG PO TABS: 0.5000 mg | ORAL_TABLET | Freq: Three times a day (TID) | ORAL | Status: DC | PRN

## 2012-04-15 ENCOUNTER — Other Ambulatory Visit: Payer: Self-pay

## 2012-04-28 DIAGNOSIS — K5289 Other specified noninfective gastroenteritis and colitis: Secondary | ICD-10-CM | POA: Diagnosis not present

## 2012-04-30 ENCOUNTER — Other Ambulatory Visit: Payer: Self-pay | Admitting: Oncology

## 2012-04-30 DIAGNOSIS — C50911 Malignant neoplasm of unspecified site of right female breast: Secondary | ICD-10-CM

## 2012-05-03 ENCOUNTER — Telehealth: Payer: Self-pay | Admitting: *Deleted

## 2012-05-03 NOTE — Telephone Encounter (Signed)
Called and spoke with patient to reschedule her appt. Confirmed appt. For 07/06/12 at 10am with Bernell List.  Then will become Dr. Darnelle Catalan.

## 2012-05-08 ENCOUNTER — Encounter: Payer: Self-pay | Admitting: Oncology

## 2012-06-08 ENCOUNTER — Other Ambulatory Visit: Payer: Medicare Other | Admitting: Lab

## 2012-06-09 ENCOUNTER — Other Ambulatory Visit: Payer: Self-pay | Admitting: Ophthalmology

## 2012-06-09 DIAGNOSIS — D2310 Other benign neoplasm of skin of unspecified eyelid, including canthus: Secondary | ICD-10-CM | POA: Diagnosis not present

## 2012-06-14 ENCOUNTER — Other Ambulatory Visit: Payer: Self-pay

## 2012-06-14 ENCOUNTER — Encounter: Payer: Self-pay | Admitting: Neurology

## 2012-06-14 ENCOUNTER — Ambulatory Visit (INDEPENDENT_AMBULATORY_CARE_PROVIDER_SITE_OTHER): Payer: Medicare Other | Admitting: Neurology

## 2012-06-14 VITALS — BP 152/93 | HR 96 | Ht 65.0 in | Wt 166.0 lb

## 2012-06-14 DIAGNOSIS — R234 Changes in skin texture: Secondary | ICD-10-CM

## 2012-06-14 DIAGNOSIS — J302 Other seasonal allergic rhinitis: Secondary | ICD-10-CM | POA: Insufficient documentation

## 2012-06-14 DIAGNOSIS — R569 Unspecified convulsions: Secondary | ICD-10-CM | POA: Diagnosis not present

## 2012-06-14 DIAGNOSIS — R0609 Other forms of dyspnea: Secondary | ICD-10-CM

## 2012-06-14 DIAGNOSIS — D649 Anemia, unspecified: Secondary | ICD-10-CM

## 2012-06-14 DIAGNOSIS — L988 Other specified disorders of the skin and subcutaneous tissue: Secondary | ICD-10-CM | POA: Diagnosis not present

## 2012-06-14 DIAGNOSIS — R0989 Other specified symptoms and signs involving the circulatory and respiratory systems: Secondary | ICD-10-CM | POA: Diagnosis not present

## 2012-06-14 DIAGNOSIS — R351 Nocturia: Secondary | ICD-10-CM

## 2012-06-14 DIAGNOSIS — R58 Hemorrhage, not elsewhere classified: Secondary | ICD-10-CM

## 2012-06-14 DIAGNOSIS — I998 Other disorder of circulatory system: Secondary | ICD-10-CM

## 2012-06-14 DIAGNOSIS — G609 Hereditary and idiopathic neuropathy, unspecified: Secondary | ICD-10-CM | POA: Diagnosis not present

## 2012-06-14 DIAGNOSIS — R350 Frequency of micturition: Secondary | ICD-10-CM

## 2012-06-14 DIAGNOSIS — R0683 Snoring: Secondary | ICD-10-CM

## 2012-06-14 DIAGNOSIS — R918 Other nonspecific abnormal finding of lung field: Secondary | ICD-10-CM

## 2012-06-14 DIAGNOSIS — R222 Localized swelling, mass and lump, trunk: Secondary | ICD-10-CM

## 2012-06-14 DIAGNOSIS — J309 Allergic rhinitis, unspecified: Secondary | ICD-10-CM

## 2012-06-14 MED ORDER — LEVETIRACETAM 500 MG PO TABS: 500.0000 mg | ORAL_TABLET | Freq: Two times a day (BID) | ORAL | Status: DC

## 2012-06-14 NOTE — Telephone Encounter (Signed)
Patient asked checkout to have me send 90 day rx for SZ med to [facility].

## 2012-06-14 NOTE — Progress Notes (Signed)
HPI: Kele is a 78 years old right-handed Caucasian female, accompanied by her husband, referred by her primary care physician Dr. Adrian Prince for evaluation of transient loss of consciousness  She had a past medical history of hyperlipidemia, hypothyroidism,left breast cancer in 1997, right breast cancer in 2012, status post lobectomy, followed by chemotherapy, and radiation therapy, during the chemotherapy, she began to notice bilateral feet and fingertips paresthesia, she also suffered a fall accident from radiation table, with her left femur fracture  She reports nine-month history of passing out, had a total 6 episode, all has similar clinical presentation, the most recent one was 4 weeks ago, after sitting in chair for a while, she got up, walking towards the kitchen, without warning signs, she fell to the ground, transient loss of consciousness, all the episode were similar.  When she got up quickly from a seated position, she also complains of lightheadedness, there was reported orthostatic blood pressure changes, she was put on midodrine which does help her symptoms,  [facility] Record reviewed, MRI of the brain with and without contrast September 2012 was normal, CT scan of the brain with and without contrast September 2012 was normal  She has mild gait difficulty due to a left femoral fracture, there was no other focal signs,  Later, her family reported more episodes of loss of consciousness, without warning signs over past 6 months, besides those described positional related lightheadedness, orthostatic symptoms. There was one episode she fell out without warning signs, landed on the floor, loss of consciousness witnessed by her husband and son, have seizure-like activity, with bowel and bladder incontinence, followed by mild postictal confusion. EEG has showed left temporal lobe irritation, she was started on levetiractam 250 mg twice a day tolerating it well, there was no recurrent  similar symptoms.    Repeat  MRI brain showed mild-to-moderate atrophy, there was no acute intracranial lesions  UPDATE April 16th 2014: She reported recurrent episode of bilateral lower extremity weakness sensation, shaky, loss of consciousness, she had 3 episodes in past 2 weeks, there was one episode witnessed by her family had seizure-like activity, bladder incontinence, lasting few minutes, she is taking Keppra 250 twice a day, no significant side effects, reported that he noticed 50% improvement, she is also taking midodrine 5 mg twice a day,   Her daughter also reported excessive snoring, sleepiness during the daytime, today's ESS score is 15, FSS score is 56  Physical Exam BP sitting down 170/90, standing up 160/90s  Neck: supple no carotid bruits Respiratory: clear to auscultation bilaterally Cardiovascular: regular rate rhythm  Neurologic Exam  Mental Status: pleasant, awake, alert, cooperative to history, talking, and casual conversation. Cranial Nerves: CN II-XII pupils were equal round reactive to light.  Fundi were sharp bilaterally.  Extraocular movements were full.  Visual fields were full on confrontational test.  Facial sensation and strength were normal.  Hearing was intact to finger rubbing bilaterally.  Uvula tongue were midline.  Head turning and shoulder shrugging were normal and symmetric.  Tongue protrusion into the cheeks strength were normal.  Motor: Normal tone, bulk, and strength. Sensory: mildly length dependent decreased to light touch, pinprick at ankle level, normal toe proprioception, and decreased toe vibratory sensation, very thin bruised skin. Coordination: Normal finger-to-nose, heel-to-shin.  There was no dysmetria noticed. Gait and Station: limp, avoiding weight on left side. Reflexes: Deep tendon reflexes: Biceps: 1/1, Brachioradialis:1/1  Triceps: 1/1, Pateller: 1/1, Achilles: trace.  Plantar responses are flexor.   Assessment and  Plan:  78 years  old right-handed Caucasian female, with episodes of passing out, most suggestive of orthostatic syncope, also different episodes, probable seizure.  1. continue with recurrent partial seizure, will increase Keppra 500 mg twice a day,, clinic for recurrent episodes. 2 I will tapering her off midodrine, 3 return to clinic in 6 months., 4. her daughter reported obstructive sleep apnea episode, significant high ESS, and FSS score, we will proceed with sleep study

## 2012-06-15 ENCOUNTER — Ambulatory Visit: Payer: Medicare Other | Admitting: Oncology

## 2012-06-19 ENCOUNTER — Telehealth: Payer: Self-pay | Admitting: Oncology

## 2012-06-21 ENCOUNTER — Telehealth: Payer: Self-pay | Admitting: Family

## 2012-06-29 DIAGNOSIS — M4850XA Collapsed vertebra, not elsewhere classified, site unspecified, initial encounter for fracture: Secondary | ICD-10-CM

## 2012-06-29 HISTORY — DX: Collapsed vertebra, not elsewhere classified, site unspecified, initial encounter for fracture: M48.50XA

## 2012-07-04 ENCOUNTER — Encounter: Payer: Self-pay | Admitting: Family

## 2012-07-04 ENCOUNTER — Ambulatory Visit (HOSPITAL_BASED_OUTPATIENT_CLINIC_OR_DEPARTMENT_OTHER): Payer: Medicare Other | Admitting: Family

## 2012-07-04 ENCOUNTER — Telehealth: Payer: Self-pay | Admitting: *Deleted

## 2012-07-04 ENCOUNTER — Ambulatory Visit (HOSPITAL_BASED_OUTPATIENT_CLINIC_OR_DEPARTMENT_OTHER): Payer: Medicare Other | Admitting: Lab

## 2012-07-04 ENCOUNTER — Ambulatory Visit: Payer: Self-pay | Admitting: Oncology

## 2012-07-04 VITALS — BP 155/80 | HR 91 | Temp 97.9°F | Resp 20 | Ht 65.0 in | Wt 168.0 lb

## 2012-07-04 DIAGNOSIS — Z853 Personal history of malignant neoplasm of breast: Secondary | ICD-10-CM

## 2012-07-04 DIAGNOSIS — C50419 Malignant neoplasm of upper-outer quadrant of unspecified female breast: Secondary | ICD-10-CM | POA: Diagnosis not present

## 2012-07-04 DIAGNOSIS — C50919 Malignant neoplasm of unspecified site of unspecified female breast: Secondary | ICD-10-CM | POA: Diagnosis not present

## 2012-07-04 DIAGNOSIS — M810 Age-related osteoporosis without current pathological fracture: Secondary | ICD-10-CM | POA: Diagnosis not present

## 2012-07-04 DIAGNOSIS — Z87898 Personal history of other specified conditions: Secondary | ICD-10-CM

## 2012-07-04 DIAGNOSIS — Z170 Estrogen receptor positive status [ER+]: Secondary | ICD-10-CM

## 2012-07-04 DIAGNOSIS — E559 Vitamin D deficiency, unspecified: Secondary | ICD-10-CM | POA: Diagnosis not present

## 2012-07-04 DIAGNOSIS — C50911 Malignant neoplasm of unspecified site of right female breast: Secondary | ICD-10-CM

## 2012-07-04 LAB — COMPREHENSIVE METABOLIC PANEL (CC13)
ALT: 22 U/L (ref 0–55)
AST: 20 U/L (ref 5–34)
Alkaline Phosphatase: 72 U/L (ref 40–150)
Creatinine: 0.7 mg/dL (ref 0.6–1.1)
Total Bilirubin: 0.52 mg/dL (ref 0.20–1.20)

## 2012-07-04 LAB — CBC WITH DIFFERENTIAL/PLATELET
BASO%: 0.2 % (ref 0.0–2.0)
Basophils Absolute: 0 10*3/uL (ref 0.0–0.1)
EOS%: 0.1 % (ref 0.0–7.0)
HCT: 36.2 % (ref 34.8–46.6)
HGB: 12.3 g/dL (ref 11.6–15.9)
MCHC: 34.1 g/dL (ref 31.5–36.0)
MONO#: 0.6 10*3/uL (ref 0.1–0.9)
NEUT%: 70.6 % (ref 38.4–76.8)
RDW: 13.5 % (ref 11.2–14.5)
WBC: 8 10*3/uL (ref 3.9–10.3)
lymph#: 1.7 10*3/uL (ref 0.9–3.3)

## 2012-07-04 MED ORDER — LORAZEPAM 0.5 MG PO TABS: 0.5000 mg | ORAL_TABLET | Freq: Three times a day (TID) | ORAL | Status: DC | PRN

## 2012-07-04 NOTE — Progress Notes (Signed)
[facility]  [phone] [phone]  OFFICE PROGRESS NOTE   ID: [name]   DOB: 1934-10-23  MR#: [id number]  MWU#:[id number]   PCP: Julian Hy, MD SU: Cyndia Bent, M.D. RAD ONC: Chipper Herb, M.D. NEURO:  Levert Feinstein, M.D.   HISTORY OF PRESENT ILLNESS: From Dr. Theron Arista Rubin's new patient evaluation note dated 01/05/2010: "This woman has been in excellent health.  She has undergone annual screening mammography.  She underwent a routine screening mammogram on 12/11/2009, this showed a possible mass, right breast.  The patient was contacted and underwent a diagnostic mammogram and right breast ultrasound on 12/19/2009 at the time of the imaging tests, physical exam did not reveal any abnormalities.  There is focal tenderness at the 10 o' clock, 2 cm from the right nipple.  Ultrasound showed a hypoechoic mass with a microlobulated border at 10 o' clock, 2 cm from the nipple measuring 2 x 1.2 x 1.8 cm.  Ultrasound of that axilla did not reveal abnormal lymph nodes.  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  (The case [phone]).  The estrogen receptors were negative, progesterone receptor was negative, proliferative index 96% and the HER-2 had a ratio of 1.31.  An MRI scan was done 12/29/2009, which showed enhancing 1.8 x 2.3 x 2.4 cm mass in the left and upper quadrant.  No enlarged lymph nodes were seen.  Some sub-centimeter lesions were seen in the liver, likely representing cyst.  The patient has elected to undergo genetic testing and is awaiting those results prior to making a final decision regarding surgical therapy.  In addition, she is here to discuss neoadjuvant therapy.  This patient has a history of DCIS involving the left breast.  This is associated with microinvasion.  This was diagnosed in 1996.  She had left central lumpectomy and axillary node  dissection that was negative.  She was seen by Dr. Carmina Miller, and underwent radiation therapy to the breast.  She has not received any adjuvant hormonal therapy.  We do not have any other details regarding the specific pathological diagnosis."  Her subsequent history is as detailed below.   INTERVAL HISTORY: Dr. Darnelle Catalan and I saw [name] today for follow up of right breast, triple negative, invasive ductal carcinoma.  The patient is accompanied for today's office visit by her husband [name]. The patient was last seen by Dr. Donnie Coffin on 12/10/2011.   Since her last office visit, the patient was kept overnight in the ER from 02/22/2012 through 02/23/2012 for severe hypokalemia with a potassium level at 2.6. She is establishing herself with Dr. Darrall Dears service today.  REVIEW OF SYSTEMS: A 10 point review of systems was completed and the patient states that she has been going downhill medically since she broke her femur during the last days of radiation treatment.  She states that she has ongoing leg pain, she recently began experiencing seizures and is now taking Keppra twice a day.  She also has noticed that she bruises easily on her upper and lower extremities.  The patient denies any other symptomatology.   PAST MEDICAL HISTORY: Past Medical History  Diagnosis Date  . IBS (irritable bowel syndrome)   . Fibromyalgia   . History of DVT (deep vein thrombosis)   . History of pulmonary embolism   . Osteoporosis   . History of breast cancer LEFT BREAST DCIS  1996  . Hyperlipidemia   .  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 Breast cancer RIGHT SIDE-- DX OCT 2011    CHEMORADIATION THERAPY-- COMPLETED   . PONV (postoperative nausea and vomiting)   . Skin tear LEFT ARM COVERED W/ TEGADERM    PT STATES HX MULTIPLE SKIN TEARS -- THIN  . Thin skin SECONARDY AGE AND HX CHEMO  . Ecchymosis   . Seasonal allergies   . Frequency of urination   .  Nocturia   . Anemia   . Lung mass PER DR CLANCE NOTE UNCLEAR IF LYMPHOMA FROM BX    FOLLOWED BY DR RUBIN  . Seizures   . Unspecified hereditary and idiopathic peripheral neuropathy   . Asthma   . Fibromyalgia     PAST SURGICAL HISTORY: Past Surgical History  Procedure Laterality Date  . Total abdominal hysterectomy w/ bilateral salpingoophorectomy  1977  (APPROX)  . Cholecystectomy    . Appendectomy    . Femur fracture surgery  12-18-2010  DR BEANE    INTRAMEDULLARY NAILING LEFT INTERTROCHANTERIC -SUBTROCHANTERIC FX  . Right breast lumpectomy / removal lymph nodes x2/ removal pac  10-01-2010    CARCINOMA RIGHT BREAST  . Placement port- a- cath  02-18-2010  . Bronchoscopy  01-29-2010    W/ BX  . Tvt vaginal tape  suburethral sling   06-08-2005    SUI  . Bilateral laminectomy/ foraminotomy  01-14-2004    L4 - L5  . Breast lumpectomy  1996     LEFT BREAST DCIS   . Cataract extraction w/ intraocular lens  implant, bilateral    . Bilateral carpal tunnel release    . Tonsillectomy    . Transthoracic echocardiogram  02-25-2010    LVSF NORMAL/ EF 55-60%/ GRADE I DIASTOLIC DYSFUNCTION/ MILDLY DILATED LEFT ATRIUM  . Hardware removal  10/11/2011    Procedure: HARDWARE REMOVAL;  Surgeon: Javier Docker, MD;  Location: [facility];  Service: Orthopedics;  Laterality: Left;  LEFT THIGH REMOVAL OF DISTAL LOCKING SCREW NEEDS: FLORO, RADIO LUSCENT TABLE AND SCREWDRIVER FOR BIOMET ROD   . Colonoscopy    Includes history of hysterectomy 30 years ago with oophorectomy.  This is done by Dr. Laureen Ochs by her report.  She had a large ovarian mass, which was diagnosed as possibly a borderline tumor.  She had a cholecystectomy 20 years ago, appendectomy 50 years ago.  FAMILY HISTORY Family History  Problem Relation Age of Onset  . Emphysema Father   . Ovarian cancer Mother   . Cancer Mother     cervical  . Breast cancer Maternal Grandmother   . Cancer Sister     cervical  Both  parents deceased.  Father died about age 52 of unknown causes.  Mother died over 5 years ago, the patient reports she had ovarian cancer as did the patient's sister with both ovarian and breast cancer.  The patient has a maternal aunt who had ovarian cancer.  Five maternal aunts, one had ovarian cancer and one had breast cancer, a niece had breast cancer.  GYNECOLOGIC HISTORY: Gravida 2, para 2, menarche age 44, menopause at the time of hysterectomy.  She does not recall her last bone density test.  SOCIAL HISTORY: The patient has been married for over 55 years.  They have two adult sons, 5 grandsons, and 2 great grandchildren.  The patient and her husband are both retired. 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  She has  family that lives in [place], [place].  Her daughter is a Engineer, civil (consulting) who works for [employer].   In her spare time she enjoys gardening, swimming, being with her family, and going to church.   ADVANCED DIRECTIVES: Not on file  HEALTH MAINTENANCE: History  Substance Use Topics  . Smoking status: Former Smoker -- 1.00 packs/day for 30 years    Types: Cigarettes    Quit date: 03/01/1978  . Smokeless tobacco: Never Used  . Alcohol Use: 4.2 oz/week    7 Glasses of wine per week    Colonoscopy: Not on file PAP: Not on file Bone density: The patient's last bone density examination on 06/21/2011 showed a T score of -2.8 (osteoporosis). Lipid panel: Not on file  Allergies  Allergen Reactions  . Penicillins Anaphylaxis  . Adhesive (Tape) Other (See Comments)    Blisters   . Doxycycline Nausea And Vomiting  . Morphine And Related Other (See Comments)    HALLUCINATIONS    Current Outpatient Prescriptions  Medication Sig Dispense Refill  . Ascorbic Acid (VITAMIN C) 1000 MG tablet Take 1,000 mg by mouth daily.      Marland Kitchen dexlansoprazole (DEXILANT) 60 MG capsule Take 60 mg by mouth daily.      . DULoxetine  (CYMBALTA) 60 MG capsule Take 1 capsule (60 mg total) by mouth daily.  90 capsule  1  . ergocalciferol (VITAMIN D2) 50000 UNITS capsule Take 50,000 Units by mouth once a week. tuesday      . ezetimibe-simvastatin (VYTORIN) 10-80 MG per tablet Take 1 tablet by mouth at bedtime.       . gabapentin (NEURONTIN) 100 MG capsule TAKE 2 TO 3 CAPSULES THREE TIMES A DAY  810 capsule  2  . levETIRAcetam (KEPPRA) 500 MG tablet Take 1 tablet (500 mg total) by mouth 2 (two) times daily.  180 tablet  3  . levothyroxine (SYNTHROID, LEVOTHROID) 88 MCG tablet Take 88 mcg by mouth every morning.       Marland Kitchen LORazepam (ATIVAN) 0.5 MG tablet Take 1 tablet (0.5 mg total) by mouth every 8 (eight) hours as needed. Anxiety  30 tablet  0  . meloxicam (MOBIC) 15 MG tablet Take 15 mg by mouth daily as needed. pain      . metroNIDAZOLE (METROGEL) 1 % gel Apply 1 application topically daily.      . midodrine (PROAMATINE) 5 MG tablet Take 5 mg by mouth 2 (two) times daily.      . ondansetron (ZOFRAN) 4 MG tablet Take 1 tablet (4 mg total) by mouth every 6 (six) hours.  20 tablet  0  . vitamin E 400 UNIT capsule Take 400 Units by mouth daily.       No current facility-administered medications for this visit.   Facility-Administered Medications Ordered in Other Visits  Medication Dose Route Frequency Provider Last Rate Last Dose  . fentaNYL (SUBLIMAZE) injection 25-50 mcg  25-50 mcg Intravenous Q5 min PRN Gaetano Hawthorne, MD      . lactated ringers infusion   Intravenous Continuous Gaetano Hawthorne, MD        OBJECTIVE: Filed Vitals:   07/04/12 1318  BP: 155/80  Pulse: 91  Temp: 97.9 F (36.6 C)  Resp: 20     Body mass index is 27.96 kg/(m^2).      ECOG FS:  Grade 1 - Symptomatic, but fully ambulatory           General appearance: Alert, cooperative, well nourished, no apparent  distress Head: Normocephalic, without obvious abnormality, atraumatic Eyes: Arcus senilis, PERRLA, EOMI Nose: Nares, septum and mucosa are  normal, no drainage or sinus tenderness Neck: No adenopathy, supple, symmetrical, trachea midline, goiter, no tenderness Resp: Clear to auscultation bilaterally Cardio: Regular rate and rhythm, S1, S2 normal, no murmur, click, rub or gallop Breasts: Left breast is visibly smaller than the right breast, both breasts have retracted nipples, well-healed surgical scar, no lymphadenopathy, no axilla fullness GI: Soft, distended, non-tender, hypoactive bowel sounds, no organomegaly Skin: Bilateral upper and lower extremities have signs of venous stasis and are hyperpigmented, bilateral upper and lower extremities have bruising in various stages of healing, well-healed abdominal surgical scars Extremities: Extremities are atraumatic, no cyanosis or edema, bilateral lower extremities limited range of motion Lymph nodes: Cervical, supraclavicular, and axillary nodes normal Neurologic: Grossly normal, the patient ambulates with a cane  LAB RESULTS: Lab Results  Component Value Date   WBC 8.0 07/04/2012   NEUTROABS 5.6 07/04/2012   HGB 12.3 07/04/2012   HCT 36.2 07/04/2012   MCV 96.7 07/04/2012   PLT 218 07/04/2012      Chemistry      Component Value Date/Time   NA 137 07/04/2012 1456   NA 137 02/22/2012 1505   K 3.3* 07/04/2012 1456   K 3.8 02/23/2012 0154   CL 102 07/04/2012 1456   CL 101 02/22/2012 1505   CO2 25 07/04/2012 1456   CO2 23 02/22/2012 1505   BUN 16.5 07/04/2012 1456   BUN 16 02/22/2012 1505   CREATININE 0.7 07/04/2012 1456   CREATININE 0.85 02/22/2012 1505      Component Value Date/Time   CALCIUM 8.9 07/04/2012 1456   CALCIUM 9.2 02/22/2012 1505   ALKPHOS 72 07/04/2012 1456   ALKPHOS 81 02/22/2012 1505   AST 20 07/04/2012 1456   AST 35 02/22/2012 1505   ALT 22 07/04/2012 1456   ALT 42* 02/22/2012 1505   BILITOT 0.52 07/04/2012 1456   BILITOT 0.4 02/22/2012 1505       Lab Results  Component Value Date   LABCA2 35 01/05/2010    Urinalysis    Component Value Date/Time   COLORURINE AMBER*  02/22/2012 1507   APPEARANCEUR CLEAR 02/22/2012 1507   LABSPEC 1.026 02/22/2012 1507   PHURINE 6.0 02/22/2012 1507   GLUCOSEU NEGATIVE 02/22/2012 1507   HGBUR TRACE* 02/22/2012 1507   BILIRUBINUR SMALL* 02/22/2012 1507   KETONESUR TRACE* 02/22/2012 1507   PROTEINUR NEGATIVE 02/22/2012 1507   UROBILINOGEN 0.2 02/22/2012 1507   NITRITE NEGATIVE 02/22/2012 1507   LEUKOCYTESUR NEGATIVE 02/22/2012 1507    STUDIES: No results found.  ASSESSMENT: 78 y.o[place]:  1.  History of left breast DCIS in 1996  2.  Status post right needle core biopsy on 12/19/2009 which showed a high-grade invasive ductal carcinoma with papillary features and high-grade DCIS, ER 0%, PR 0%, Ki-67 96%, HER-2/neu negative by CISH with a ratio of 1.31.  3.  Status post bilateral breast MRI on 12/29/2009 which showed an enhancing 1.8 x 2.3 x 2.4 cm mass in the upper outer quadrant of the right breast.  No additional masses were seen in the right breast.  There were no enlarged axillary or internal mammary adenopathy.  No abnormal enhancement was seen in the left breast.  4.  The patient underwent genetic counseling and testing and had a negative BRCA1 and BRCA2 gene mutation letter sent to her dated 01/09/2010.  5.  Status post right lower lobe pulmonary biopsy  on 02/10/2010 that was negative for malignancy.  6.  Status post 2-D echocardiogram on 02/25/2010 which showed an ejection fraction of 55% to 60%.  7.  Status post neoadjuvant chemotherapy with dose dense FEC x 3 cycles from 03/12/2010 through 04/17/2010 with Neulasta support.  8.  Status post neoadjuvant chemotherapy with FEC x 1 dose on 05/08/2010 with Neulasta support.  9.  Status post neoadjuvant chemotherapy with Abraxane x 9 doses from 06/03/2010 through 08/12/2010.  10.  Status post right lumpectomy with sentinel node biopsy and excision of additional lateral/inferior margins on 10/01/2010 which showed a stage 0, ypTis ypN0,  high-grade 0.45 cm DCIS, ER 0%, PR 0%, margins were negative for carcinoma, 0/2 positive lymph nodes.  12.  Status post radiation therapy from 11/18/2010 through 01/12/2011.  13.  The patient had a PET scan on 03/14/2012 which showed no specific features identified to suggest residual or recurrence of breast cancer, right anterior rib fractures, left maxillary sinus inflammation.  14.  History of hypokalemia  15.  Anxiety  16.  Osteoporosis   PLAN: 1.  We plan to see the patient again in 6 months (12/2012) and then yearly thereafter.  We will check a CBC, CMP, LDH, and vitamin D level during her next office visit. The patient's last mammogram on 06/20/2011 showed no mammographic evidence of malignancy.  We will schedule her past due mammogram for her.    2.  The patient has not been taking her prescribed 20 mEq of potassium chloride daily and her potassium level is 3.3.  The patient's primary care physician's office and the patient herself were notified of her potassium level.  The patient was asked to increase her intake of potassium rich foods and was given examples of foods that are rich in potassium.    3.  The patient was given a prescription for Lorazepam 0.5 mg by mouth Q8, when necessary for anxiety #30 with 0 refills.  4.  The patient's last bone density scan on 06/21/2011 showed a T score of -2.8 (osteoporosis).  The patient was encouraged to continue taking vitamin D 50,000 units q. 7 days.  All questions were answered.  The patient was encouraged to contact us in the interim with any problems, questions or concerns.   Larina Bras, NP-C 07/07/2012, 4:51 PM

## 2012-07-04 NOTE — Patient Instructions (Addendum)
Please contact us at [phone] if you have any questions or concerns.  Please continue to do well and enjoy life!!!  Get plenty of rest, drink plenty of water, exercise daily, eat a balanced diet.     

## 2012-07-04 NOTE — Telephone Encounter (Signed)
appts made and printed...td 

## 2012-07-05 ENCOUNTER — Ambulatory Visit: Payer: Medicare Other | Admitting: Family

## 2012-07-05 ENCOUNTER — Telehealth: Payer: Self-pay | Admitting: Family

## 2012-07-05 LAB — VITAMIN D 25 HYDROXY (VIT D DEFICIENCY, FRACTURES): Vit D, 25-Hydroxy: 47 ng/mL (ref 30–89)

## 2012-07-05 NOTE — Telephone Encounter (Signed)
Notified Mrs. Furlough of potassium level (3.3) and let her know that I have alerted her PCP's office.  Advised pt. to consume bananas and/or orange juice for the next few days.  Spoke to [place], CMA at Dr. Rinaldo Cloud office [phone]) and let her know about low potassium level and that patient has been hospitalized previously with hypokalemia.  She will forward lab results to Dr. Clelia Croft and Dr. [name].  Carollee Herter states that their office is on the Epic EMR system and she could view the patient's lab results.

## 2012-07-06 ENCOUNTER — Other Ambulatory Visit: Payer: Self-pay | Admitting: Neurology

## 2012-07-06 ENCOUNTER — Telehealth: Payer: Self-pay | Admitting: Neurology

## 2012-07-06 ENCOUNTER — Ambulatory Visit: Payer: Medicare Other | Admitting: Family

## 2012-07-06 DIAGNOSIS — G4730 Sleep apnea, unspecified: Secondary | ICD-10-CM

## 2012-07-06 DIAGNOSIS — G4710 Hypersomnia, unspecified: Secondary | ICD-10-CM

## 2012-07-06 DIAGNOSIS — R0683 Snoring: Secondary | ICD-10-CM

## 2012-07-06 NOTE — Telephone Encounter (Signed)
This patient qualifies for attended sleep study based on clinical history. Witnessed apneas, snoring, poor sleep. EDS .  Patient will need a PLIT at AHI 10 and  3% scoring.  Offer lunesta prn,  no CO2 necessary. BMI is not obese.

## 2012-07-06 NOTE — Telephone Encounter (Signed)
Dr. Levert Feinstein is referring this patient for evaluation of sleep apnea.  Ht. 5'5" Wt. 166 lb BMI 27.62  Excessive Snoring Excessive Daytime Sleepiness Witnessed Apneas Nocturia  Medication List Vitamin C 1000 MG Dexilant 60 MG Cymbalta 60 MG Vitamin D2 50000 Vytorin 10-80 MG Sublimaze Inj 25-50 mcg Neurontin 100 MG Lactated Ringers Infusion Keppra 500 MG Levothyroxine 88 MCG Lorazepam 0.5 MG Meloxicam 15 MG Metrogel 1% Midodrine 5 MG Ondansetron 4 MG Vitamin E 400 Unit  Dr. Terrace Arabia requests patient be evaluated for sleep apnea.  Her daughter reported obstructive sleep apnea episodes, excessive snoring, sleepiness during the daytime.  She endorses Epworth at 15 and FSS score is 56.

## 2012-07-06 NOTE — Telephone Encounter (Deleted)
MD SLEEP REVIEW  REFERRING MD:  Levert Feinstein, MD PCP:  Adrian Prince, MD INSURANCE COVERAGE:  MEDICARE / [employer] HEIGHT:  5'5" WEIGHT: 166 LB BMI: 27.62   SLEEP SYMPTOMS:  SNORING, NOCTURIA, EXCESSIVE DAYTIME SLEEPINESS, ESS IS 15, FSS IS 56  MEDICAL HISTORY:  Hyperlipidemia, Hypothyroidism, left breast cancer 1997, right breast cancer 2012, femur fx, transient loss of consciousness (x6 months), seizures  MEDICATIONS:  NOTES: COVERAGE INFORMATION:

## 2012-07-07 ENCOUNTER — Encounter: Payer: Self-pay | Admitting: Family

## 2012-07-10 DIAGNOSIS — K5289 Other specified noninfective gastroenteritis and colitis: Secondary | ICD-10-CM | POA: Diagnosis not present

## 2012-07-13 ENCOUNTER — Ambulatory Visit (INDEPENDENT_AMBULATORY_CARE_PROVIDER_SITE_OTHER): Payer: Medicare Other

## 2012-07-13 DIAGNOSIS — G4710 Hypersomnia, unspecified: Secondary | ICD-10-CM

## 2012-07-13 DIAGNOSIS — G4761 Periodic limb movement disorder: Secondary | ICD-10-CM | POA: Diagnosis not present

## 2012-07-13 DIAGNOSIS — G4730 Sleep apnea, unspecified: Secondary | ICD-10-CM

## 2012-07-13 DIAGNOSIS — R0609 Other forms of dyspnea: Secondary | ICD-10-CM | POA: Diagnosis not present

## 2012-07-14 ENCOUNTER — Ambulatory Visit
Admission: RE | Admit: 2012-07-14 | Discharge: 2012-07-14 | Disposition: A | Discharge status: Home / Self Care | Payer: Medicare Other | Source: Ambulatory Visit | Attending: Family | Admitting: Family

## 2012-07-14 ENCOUNTER — Telehealth: Payer: Self-pay | Admitting: Family

## 2012-07-14 DIAGNOSIS — Z853 Personal history of malignant neoplasm of breast: Secondary | ICD-10-CM

## 2012-07-14 DIAGNOSIS — C50911 Malignant neoplasm of unspecified site of right female breast: Secondary | ICD-10-CM

## 2012-07-14 DIAGNOSIS — N6489 Other specified disorders of breast: Secondary | ICD-10-CM | POA: Diagnosis not present

## 2012-07-14 NOTE — Telephone Encounter (Signed)
Notified [name] that her mammogram today did not show any evidence of malignancy in either breast. She voiced understanding.

## 2012-07-18 ENCOUNTER — Ambulatory Visit (INDEPENDENT_AMBULATORY_CARE_PROVIDER_SITE_OTHER): Payer: Medicare Other | Admitting: Pulmonary Disease

## 2012-07-18 ENCOUNTER — Encounter: Payer: Self-pay | Admitting: Pulmonary Disease

## 2012-07-18 VITALS — BP 120/72 | HR 99 | Temp 97.3°F | Ht 66.5 in | Wt 167.4 lb

## 2012-07-18 DIAGNOSIS — R05 Cough: Secondary | ICD-10-CM | POA: Diagnosis not present

## 2012-07-18 MED ORDER — FLUTICASONE PROPIONATE 50 MCG/ACT NA SUSP: 2.0000 | Freq: Every day | NASAL | Status: DC

## 2012-07-18 NOTE — Progress Notes (Signed)
  Subjective:    Patient ID: [name], female    DOB: 02-19-1935, 78 y.o.   MRN: [id number]  HPI The patient comes in today for a chronic cough as an acute sick visit.  She has not been seen in 2 years, and has a history of chronic cough felt secondary to postnasal drip and upper airway instability/hypersensitivity.  She has improved in the past with treatment postnasal drip and behavioral therapies for cyclical coughing.  We have never found a credible source for lower airway issues leading to cough.  She comes in today where she feels her cough is a 5/10.  She is having significant postnasal drip with mucus collecting in her throat that is discolored.  She is also having significant reflux above her usual baseline despite being on once a day proton pump inhibitor.  She has had a recent PET scan that showed opacification of her left maxillary sinus, and also showed resolution of her previous abnormal density in her lung fields.   Review of Systems  Constitutional: Negative for fever and unexpected weight change.  HENT: Positive for congestion, rhinorrhea, sneezing and postnasal drip. Negative for ear pain, nosebleeds, sore throat, trouble swallowing, dental problem and sinus pressure.   Eyes: Negative for redness and itching.  Respiratory: Positive for cough and wheezing. Negative for chest tightness and shortness of breath.   Cardiovascular: Positive for leg swelling ( ankles). Negative for palpitations.  Gastrointestinal: Negative for nausea and vomiting.  Genitourinary: Negative for dysuria.  Musculoskeletal: Negative for joint swelling.  Skin: Negative for rash.  Neurological: Negative for headaches.  Hematological: Does not bruise/bleed easily.  Psychiatric/Behavioral: Negative for dysphoric mood. The patient is not nervous/anxious.        Objective:   Physical Exam Well-developed female in no acute distress Nose with mild inflammatory changes, no purulence seen Oropharynx  clear no exudates Neck without lymphadenopathy or thyromegaly Chest totally clear to auscultation, no wheezing Cardiac exam with regular rate and rhythm Lower extremities with mild edema, left worse than right, no cyanosis Alert and oriented, moves all 4 extremities.       Assessment & Plan:

## 2012-07-18 NOTE — Patient Instructions (Addendum)
Increase dexilant to am and pm for next 2 weeks. Take chlorpheniramine 4mg  otc, 2 at bedtime and one at lunch for next 2 weeks. Take flonase nasal 2 sprays each nostril each am. Use neilmed rinses am and pm for next 2 weeks to clear out sinuses. No throat clearing, use hard candy during day to help with cough, limit voice use. followup with me in 2 weeks.

## 2012-07-18 NOTE — Addendum Note (Signed)
Addended by: Nita Sells on: 07/18/2012 10:12 AM   Modules accepted: Orders

## 2012-07-18 NOTE — Assessment & Plan Note (Signed)
The patient has had a long-standing chronic cough that I suspect is secondary to chronic sinus disease/allergic rhinitis, reflux, as well as a hypersensitize upper airway that leads to cyclical coughing.  She has never had anything to suggest a lower airway source for her cough, and has improved in the past with treatment for postnasal drip and behavioral therapies for cyclical coughing.  She is currently having significant postnasal drip with discolored mucus, and also increased reflux above her baseline.  Her PET scan in January of this year showed total opacification of her left maxillary sinus.  It is unclear whether this is infectious, or whether it is inflammatory.  I would like to treat her aggressively for chronic rhinitis, cyclical coughing, laryngopharyngeal reflux, and see if she improves.  If she does not, I will refer her back to her otolaryngologist.

## 2012-07-19 ENCOUNTER — Telehealth: Payer: Self-pay | Admitting: Pulmonary Disease

## 2012-07-19 MED ORDER — FLUTICASONE PROPIONATE 50 MCG/ACT NA SUSP: 2.0000 | Freq: Every day | NASAL | Status: DC

## 2012-07-19 NOTE — Telephone Encounter (Signed)
RX has been sent. Nothing further was needed

## 2012-07-25 ENCOUNTER — Inpatient Hospital Stay (HOSPITAL_COMMUNITY)
Admission: EM | Admit: 2012-07-25 | Discharge: 2012-07-28 | DRG: 544 | Disposition: A | Discharge status: Home Health | Payer: Medicare Other | Attending: Endocrinology | Admitting: Endocrinology

## 2012-07-25 ENCOUNTER — Emergency Department (HOSPITAL_COMMUNITY): Payer: Medicare Other

## 2012-07-25 ENCOUNTER — Encounter (HOSPITAL_COMMUNITY): Payer: Self-pay | Admitting: Emergency Medicine

## 2012-07-25 DIAGNOSIS — E039 Hypothyroidism, unspecified: Secondary | ICD-10-CM | POA: Diagnosis present

## 2012-07-25 DIAGNOSIS — S32009A Unspecified fracture of unspecified lumbar vertebra, initial encounter for closed fracture: Secondary | ICD-10-CM | POA: Diagnosis not present

## 2012-07-25 DIAGNOSIS — K589 Irritable bowel syndrome without diarrhea: Secondary | ICD-10-CM | POA: Diagnosis present

## 2012-07-25 DIAGNOSIS — K5289 Other specified noninfective gastroenteritis and colitis: Secondary | ICD-10-CM | POA: Diagnosis present

## 2012-07-25 DIAGNOSIS — R197 Diarrhea, unspecified: Secondary | ICD-10-CM | POA: Diagnosis present

## 2012-07-25 DIAGNOSIS — E785 Hyperlipidemia, unspecified: Secondary | ICD-10-CM | POA: Diagnosis present

## 2012-07-25 DIAGNOSIS — M8448XA Pathological fracture, other site, initial encounter for fracture: Secondary | ICD-10-CM | POA: Diagnosis present

## 2012-07-25 DIAGNOSIS — R7301 Impaired fasting glucose: Secondary | ICD-10-CM | POA: Diagnosis present

## 2012-07-25 DIAGNOSIS — M549 Dorsalgia, unspecified: Secondary | ICD-10-CM | POA: Diagnosis not present

## 2012-07-25 DIAGNOSIS — G589 Mononeuropathy, unspecified: Secondary | ICD-10-CM | POA: Diagnosis present

## 2012-07-25 DIAGNOSIS — F329 Major depressive disorder, single episode, unspecified: Secondary | ICD-10-CM | POA: Diagnosis present

## 2012-07-25 DIAGNOSIS — IMO0002 Reserved for concepts with insufficient information to code with codable children: Secondary | ICD-10-CM | POA: Diagnosis not present

## 2012-07-25 DIAGNOSIS — Z853 Personal history of malignant neoplasm of breast: Secondary | ICD-10-CM | POA: Diagnosis not present

## 2012-07-25 DIAGNOSIS — E876 Hypokalemia: Secondary | ICD-10-CM | POA: Diagnosis present

## 2012-07-25 DIAGNOSIS — G609 Hereditary and idiopathic neuropathy, unspecified: Secondary | ICD-10-CM | POA: Diagnosis present

## 2012-07-25 DIAGNOSIS — M810 Age-related osteoporosis without current pathological fracture: Secondary | ICD-10-CM | POA: Diagnosis present

## 2012-07-25 DIAGNOSIS — Z87891 Personal history of nicotine dependence: Secondary | ICD-10-CM

## 2012-07-25 DIAGNOSIS — K5900 Constipation, unspecified: Secondary | ICD-10-CM | POA: Diagnosis present

## 2012-07-25 DIAGNOSIS — S39012A Strain of muscle, fascia and tendon of lower back, initial encounter: Secondary | ICD-10-CM

## 2012-07-25 DIAGNOSIS — S32000A Wedge compression fracture of unspecified lumbar vertebra, initial encounter for closed fracture: Secondary | ICD-10-CM

## 2012-07-25 DIAGNOSIS — G40909 Epilepsy, unspecified, not intractable, without status epilepticus: Secondary | ICD-10-CM | POA: Diagnosis present

## 2012-07-25 DIAGNOSIS — F3289 Other specified depressive episodes: Secondary | ICD-10-CM | POA: Diagnosis present

## 2012-07-25 DIAGNOSIS — M545 Low back pain, unspecified: Secondary | ICD-10-CM | POA: Diagnosis not present

## 2012-07-25 DIAGNOSIS — S335XXA Sprain of ligaments of lumbar spine, initial encounter: Secondary | ICD-10-CM | POA: Diagnosis not present

## 2012-07-25 LAB — POCT I-STAT, CHEM 8
BUN: 17 mg/dL (ref 6–23)
Chloride: 107 mEq/L (ref 96–112)
Creatinine, Ser: 0.9 mg/dL (ref 0.50–1.10)
Sodium: 137 mEq/L (ref 135–145)
TCO2: 18 mmol/L (ref 0–100)

## 2012-07-25 MED ORDER — HYDROMORPHONE HCL PF 1 MG/ML IJ SOLN
1.0000 mg | Freq: Once | INTRAMUSCULAR | Status: AC
  Administered 2012-07-25: 1 mg via INTRAVENOUS
  Filled 2012-07-25: qty 1

## 2012-07-25 MED ORDER — HYOSCYAMINE SULFATE ER 0.375 MG PO TB12
0.3750 mg | ORAL_TABLET | Freq: Two times a day (BID) | ORAL | Status: DC
  Administered 2012-07-25 – 2012-07-28 (×6): 0.375 mg via ORAL
  Filled 2012-07-25 (×7): qty 1

## 2012-07-25 MED ORDER — TRAMADOL HCL 50 MG PO TABS: 50.0000 mg | ORAL_TABLET | Freq: Four times a day (QID) | ORAL | Status: DC | PRN

## 2012-07-25 MED ORDER — HYDROMORPHONE HCL PF 1 MG/ML IJ SOLN
1.0000 mg | INTRAMUSCULAR | Status: DC | PRN
  Administered 2012-07-26 – 2012-07-28 (×3): 1 mg via INTRAVENOUS
  Filled 2012-07-25 (×3): qty 1

## 2012-07-25 MED ORDER — ACETAMINOPHEN 325 MG PO TABS: 650.0000 mg | ORAL_TABLET | Freq: Four times a day (QID) | ORAL | Status: DC | PRN

## 2012-07-25 MED ORDER — PANTOPRAZOLE SODIUM 40 MG PO TBEC
40.0000 mg | DELAYED_RELEASE_TABLET | Freq: Every day | ORAL | Status: DC
  Administered 2012-07-25 – 2012-07-28 (×4): 40 mg via ORAL
  Filled 2012-07-25 (×4): qty 1

## 2012-07-25 MED ORDER — ONDANSETRON HCL 4 MG/2ML IJ SOLN: 4.0000 mg | Freq: Four times a day (QID) | INTRAMUSCULAR | Status: DC | PRN

## 2012-07-25 MED ORDER — LEVETIRACETAM 500 MG PO TABS
500.0000 mg | ORAL_TABLET | Freq: Two times a day (BID) | ORAL | Status: DC
  Administered 2012-07-25 – 2012-07-28 (×5): 500 mg via ORAL
  Filled 2012-07-25 (×7): qty 1

## 2012-07-25 MED ORDER — ONDANSETRON HCL 4 MG/2ML IJ SOLN
4.0000 mg | Freq: Once | INTRAMUSCULAR | Status: AC
  Administered 2012-07-25: 4 mg via INTRAVENOUS
  Filled 2012-07-25: qty 2

## 2012-07-25 MED ORDER — SODIUM CHLORIDE 0.9 % IV SOLN: 250.0000 mL | INTRAVENOUS | Status: DC | PRN

## 2012-07-25 MED ORDER — GABAPENTIN 300 MG PO CAPS
300.0000 mg | ORAL_CAPSULE | Freq: Every day | ORAL | Status: DC
  Administered 2012-07-26 – 2012-07-28 (×3): 300 mg via ORAL
  Filled 2012-07-25 (×3): qty 1

## 2012-07-25 MED ORDER — ACETAMINOPHEN 650 MG RE SUPP: 650.0000 mg | Freq: Four times a day (QID) | RECTAL | Status: DC | PRN

## 2012-07-25 MED ORDER — ENOXAPARIN SODIUM 40 MG/0.4ML ~~LOC~~ SOLN
40.0000 mg | SUBCUTANEOUS | Status: DC
  Administered 2012-07-25 – 2012-07-27 (×3): 40 mg via SUBCUTANEOUS
  Filled 2012-07-25 (×4): qty 0.4

## 2012-07-25 MED ORDER — BUDESONIDE 3 MG PO CP24
9.0000 mg | ORAL_CAPSULE | Freq: Two times a day (BID) | ORAL | Status: DC
  Administered 2012-07-25 – 2012-07-28 (×6): 9 mg via ORAL
  Filled 2012-07-25 (×7): qty 3

## 2012-07-25 MED ORDER — COLESTIPOL HCL 1 G PO TABS
1.0000 g | ORAL_TABLET | Freq: Every day | ORAL | Status: DC
  Administered 2012-07-25 – 2012-07-28 (×4): 1 g via ORAL
  Filled 2012-07-25 (×4): qty 1

## 2012-07-25 MED ORDER — SODIUM CHLORIDE 0.9 % IJ SOLN
3.0000 mL | Freq: Two times a day (BID) | INTRAMUSCULAR | Status: DC
  Administered 2012-07-26 – 2012-07-27 (×2): 3 mL via INTRAVENOUS

## 2012-07-25 MED ORDER — LEVOTHYROXINE SODIUM 88 MCG PO TABS
88.0000 ug | ORAL_TABLET | Freq: Every day | ORAL | Status: DC
  Administered 2012-07-26 – 2012-07-28 (×3): 88 ug via ORAL
  Filled 2012-07-25 (×4): qty 1

## 2012-07-25 MED ORDER — SODIUM CHLORIDE 0.9 % IJ SOLN: 3.0000 mL | INTRAMUSCULAR | Status: DC | PRN

## 2012-07-25 MED ORDER — PNEUMOCOCCAL VAC POLYVALENT 25 MCG/0.5ML IJ INJ
0.5000 mL | INJECTION | INTRAMUSCULAR | Status: AC
  Administered 2012-07-27: 0.5 mL via INTRAMUSCULAR
  Filled 2012-07-25 (×2): qty 0.5

## 2012-07-25 MED ORDER — GABAPENTIN 300 MG PO CAPS
300.0000 mg | ORAL_CAPSULE | Freq: Every evening | ORAL | Status: DC | PRN
  Filled 2012-07-25: qty 1

## 2012-07-25 MED ORDER — EZETIMIBE-SIMVASTATIN 10-80 MG PO TABS
1.0000 | ORAL_TABLET | Freq: Every day | ORAL | Status: DC
  Administered 2012-07-25 – 2012-07-27 (×3): 1 via ORAL
  Filled 2012-07-25 (×4): qty 1

## 2012-07-25 MED ORDER — VITAMIN C 500 MG PO TABS
1000.0000 mg | ORAL_TABLET | Freq: Every day | ORAL | Status: DC
  Administered 2012-07-25 – 2012-07-28 (×4): 1000 mg via ORAL
  Filled 2012-07-25 (×4): qty 2

## 2012-07-25 MED ORDER — ONDANSETRON HCL 4 MG PO TABS: 4.0000 mg | ORAL_TABLET | Freq: Four times a day (QID) | ORAL | Status: DC | PRN

## 2012-07-25 MED ORDER — FLUTICASONE PROPIONATE 50 MCG/ACT NA SUSP
2.0000 | Freq: Every day | NASAL | Status: DC
Start: 2012-07-25 — End: 2012-07-28
  Administered 2012-07-26 – 2012-07-28 (×3): 2 via NASAL
  Filled 2012-07-25: qty 16

## 2012-07-25 MED ORDER — OXYCODONE-ACETAMINOPHEN 5-325 MG PO TABS
1.0000 | ORAL_TABLET | ORAL | Status: DC | PRN
  Administered 2012-07-25 – 2012-07-28 (×10): 1 via ORAL
  Filled 2012-07-25 (×10): qty 1

## 2012-07-25 MED ORDER — CALCITONIN (SALMON) 200 UNIT/ACT NA SOLN
1.0000 | Freq: Every day | NASAL | Status: DC
  Administered 2012-07-26 – 2012-07-28 (×3): 1 via NASAL
  Filled 2012-07-25: qty 3.7

## 2012-07-25 MED ORDER — LORAZEPAM 0.5 MG PO TABS: 0.5000 mg | ORAL_TABLET | ORAL | Status: DC | PRN

## 2012-07-25 MED ORDER — DULOXETINE HCL 60 MG PO CPEP
60.0000 mg | ORAL_CAPSULE | Freq: Every day | ORAL | Status: DC
  Administered 2012-07-25 – 2012-07-28 (×4): 60 mg via ORAL
  Filled 2012-07-25 (×4): qty 1

## 2012-07-25 MED ORDER — POTASSIUM CHLORIDE CRYS ER 20 MEQ PO TBCR
20.0000 meq | EXTENDED_RELEASE_TABLET | Freq: Every day | ORAL | Status: DC
  Administered 2012-07-25 – 2012-07-28 (×4): 20 meq via ORAL
  Filled 2012-07-25 (×4): qty 1

## 2012-07-25 MED ORDER — VITAMIN D (ERGOCALCIFEROL) 1.25 MG (50000 UNIT) PO CAPS
50000.0000 [IU] | ORAL_CAPSULE | ORAL | Status: DC
  Administered 2012-07-25: 50000 [IU] via ORAL
  Filled 2012-07-25: qty 1

## 2012-07-25 MED ORDER — OXYCODONE-ACETAMINOPHEN 5-325 MG PO TABS: 1.0000 | ORAL_TABLET | ORAL | Status: DC | PRN

## 2012-07-25 NOTE — ED Notes (Signed)
[id number]<AV> Expected date:<BR> Expected time:<BR> Means of arrival:<BR> Comments:<BR> EMS, 78 F, Back Pain

## 2012-07-25 NOTE — H&P (Signed)
PCP:   Julian Hy, MD   Chief Complaint:  Back pain  HPI: Ms. Qazi is a 78 year old white female with a history of osteoporosis, prior breast cancer, and chronic diarrhea who presented to the emergency department with severe low back pain. She states that 3 days ago she bent over to pick up a dog date. At that time she heard a pop in her back. Since then, she has had excruciating low back pain which has been unrelenting. It is present when she is lying flat or with movement. It is slightly better in the fetal position. She has taken Percocet and tramadol at home which has not been helpful at all. Given these findings, she presented to the emergency department where x-ray shows an L4 compression fracture. She feels that she is unable to bear weight and is unable to go home due to the severity of her pain. We were called for admission.  Review of Systems:  Review of Systems - All systems were reviewed with the patient are negative except in history of present illness following exceptions: Chronic diarrhea which has been more severe over the past several days; severe neuropathic pain in her feet and legs; easy bruising  Past Medical History: Past Medical History  Diagnosis Date  . IBS (irritable bowel syndrome)   . Fibromyalgia   . History of DVT (deep vein thrombosis)   . History of pulmonary embolism   . Osteoporosis   . History of breast cancer LEFT BREAST DCIS  1996  . Hyperlipidemia   . 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 Breast cancer RIGHT SIDE-- DX OCT 2011    CHEMORADIATION THERAPY-- COMPLETED   . PONV (postoperative nausea and vomiting)   . Skin tear LEFT ARM COVERED W/ TEGADERM    PT STATES HX MULTIPLE SKIN TEARS -- THIN  . Thin skin SECONARDY AGE AND HX CHEMO  . Ecchymosis   . Seasonal allergies   . Frequency of urination   . Nocturia   . Anemia   . Lung mass PER DR CLANCE NOTE UNCLEAR IF LYMPHOMA FROM BX    FOLLOWED BY  DR RUBIN  . Seizures   . Unspecified hereditary and idiopathic peripheral neuropathy   . Asthma   . Fibromyalgia    Past Surgical History  Procedure Laterality Date  . Total abdominal hysterectomy w/ bilateral salpingoophorectomy  1977  (APPROX)  . Cholecystectomy    . Appendectomy    . Femur fracture surgery  12-18-2010  DR BEANE    INTRAMEDULLARY NAILING LEFT INTERTROCHANTERIC -SUBTROCHANTERIC FX  . Right breast lumpectomy / removal lymph nodes x2/ removal pac  10-01-2010    CARCINOMA RIGHT BREAST  . Placement port- a- cath  02-18-2010  . Bronchoscopy  01-29-2010    W/ BX  . Tvt vaginal tape  suburethral sling   06-08-2005    SUI  . Bilateral laminectomy/ foraminotomy  01-14-2004    L4 - L5  . Breast lumpectomy  1996     LEFT BREAST DCIS   . Cataract extraction w/ intraocular lens  implant, bilateral    . Bilateral carpal tunnel release    . Tonsillectomy    . Transthoracic echocardiogram  02-25-2010    LVSF NORMAL/ EF 55-60%/ GRADE I DIASTOLIC DYSFUNCTION/ MILDLY DILATED LEFT ATRIUM  . Hardware removal  10/11/2011    Procedure: HARDWARE REMOVAL;  Surgeon: Javier Docker, MD;  Location: [facility]  Service: Orthopedics;  Laterality: Left;  LEFT THIGH REMOVAL OF DISTAL LOCKING SCREW NEEDS: FLORO, RADIO LUSCENT TABLE AND SCREWDRIVER FOR BIOMET ROD   . Colonoscopy      Medications: Prior to Admission medications   Medication Sig Start Date End Date Taking? Authorizing Provider  Ascorbic Acid (VITAMIN C) 1000 MG tablet Take 1,000 mg by mouth daily.   Yes Historical Provider, MD  dexlansoprazole (DEXILANT) 60 MG capsule Take 60 mg by mouth 2 (two) times daily.    Yes Historical Provider, MD  DULoxetine (CYMBALTA) 60 MG capsule Take 1 capsule (60 mg total) by mouth daily. 12/10/11  Yes Pierce Crane, MD  ergocalciferol (VITAMIN D2) 50000 UNITS capsule Take 50,000 Units by mouth once a week. tuesday   Yes Historical Provider, MD  ezetimibe-simvastatin (VYTORIN)  10-80 MG per tablet Take 1 tablet by mouth at bedtime.    Yes Historical Provider, MD  fluticasone (FLONASE) 50 MCG/ACT nasal spray Place 2 sprays into the nose daily. 07/19/12  Yes Barbaraann Share, MD  gabapentin (NEURONTIN) 100 MG capsule Take 300 mg by mouth See admin instructions. 3 in the morning and if needed 3 at bedtime   Yes Historical Provider, MD  GRAPE SEED EXTRACT PO Take 1 capsule by mouth at bedtime.   Yes Historical Provider, MD  hyoscyamine (LEVBID) 0.375 MG 12 hr tablet Take 1 tablet by mouth every 12 (twelve) hours. 07/10/12  Yes Historical Provider, MD  KLOR-CON M20 20 MEQ tablet Take 1 tablet by mouth daily. 07/05/12  Yes Historical Provider, MD  levETIRAcetam (KEPPRA) 500 MG tablet Take 1 tablet (500 mg total) by mouth 2 (two) times daily. 06/14/12  Yes Levert Feinstein, MD  levothyroxine (SYNTHROID, LEVOTHROID) 88 MCG tablet Take 88 mcg by mouth every morning.    Yes Historical Provider, MD  LORazepam (ATIVAN) 0.5 MG tablet Take 1 tablet (0.5 mg total) by mouth every 8 (eight) hours as needed. Anxiety 07/04/12  Yes Keitha Butte, NP  meloxicam (MOBIC) 15 MG tablet Take 15 mg by mouth daily as needed for pain. pain   Yes Historical Provider, MD  metroNIDAZOLE (METROGEL) 1 % gel Apply 1 application topically daily. Applied to face for rosacea.   Yes Historical Provider, MD  MICRONIZED COLESTIPOL HCL 1 G tablet Take 1 tablet by mouth daily. 07/10/12  Yes Historical Provider, MD  SALINE NASAL MIST NA Place 1 spray into the nose every 12 (twelve) hours.   Yes Historical Provider, MD  traMADol (ULTRAM) 50 MG tablet Take 50 mg by mouth every 6 (six) hours as needed for pain.   Yes Historical Provider, MD  UCERIS 9 MG TB24 Take 1 capsule by mouth every 12 (twelve) hours. 07/10/12  Yes Historical Provider, MD  VITAMIN E PO Take 400 mg by mouth daily.   Yes Historical Provider, MD  oxyCODONE-acetaminophen (PERCOCET) 5-325 MG per tablet Take 1 tablet by mouth every 4 (four) hours as needed for pain.  07/25/12   Gilda Crease, MD    Allergies:   Allergies  Allergen Reactions  . Penicillins Anaphylaxis  . Adhesive (Tape) Other (See Comments)    Blisters   . Doxycycline Nausea And Vomiting  . Morphine And Related Other (See Comments)    HALLUCINATIONS    Social History:  reports that she quit smoking about 34 years ago. Her smoking use included Cigarettes. She has a 30 pack-year smoking history. She has never used smokeless tobacco. She reports that she drinks about 4.2 ounces of alcohol per week. She reports that she does  not use illicit drugs.  m 2 children 5 GC GGC  born 05/2009/ Sharon Seller 09/13  Family History: Family History  Problem Relation Age of Onset  . Emphysema Father   . Ovarian cancer Mother   . Cancer Mother     cervical  . Breast cancer Maternal Grandmother   . Cancer Sister     cervical    Physical Exam: Filed Vitals:   07/25/12 1353  Pulse: 84  Temp: 98.8 F (37.1 C)  TempSrc: Oral  Resp: 18  Height: 5' 6.5" (1.689 m)  Weight: 75.751 kg (167 lb)  SpO2: 100%   General appearance: alert and In obvious discomfort with movement to Head: Normocephalic, without obvious abnormality, atraumatic; alopecia Eyes: conjunctivae/corneas clear. PERRL, EOM's intact.  Nose: Nares normal. Septum midline. Mucosa normal. No drainage or sinus tenderness. Throat: lips, mucosa, and tongue normal; teeth and gums normal Neck: no adenopathy, no carotid bruit, no JVD and thyroid not enlarged, symmetric, no tenderness/mass/nodules Resp: clear to auscultation bilaterally Cardio: regular rate and rhythm GI: soft, non-tender; bowel sounds normal; no masses,  no organomegaly Extremities: Tenderness over lumbar spine Pulses: 2+ and symmetric Lymph nodes: Cervical adenopathy: no cervical lymphadenopathy Neurologic: Alert and oriented X 3, normal strength and tone. Normal symmetric reflexes.    Labs on Admission:   Recent Labs  07/25/12 1449  NA 137  K 3.1*  CL  107  GLUCOSE 92  BUN 17  CREATININE 0.90    Recent Labs  07/25/12 1449  HGB 12.2  HCT 36.0    Lab Results  Component Value Date   INR 0.94 12/18/2010   INR 1.07 04/01/2010   INR 0.94 02/10/2010    Radiological Exams on Admission: Dg Lumbar Spine Complete  07/25/2012   *RADIOLOGY REPORT*  Clinical Data: Fall, back pain  LUMBAR SPINE - COMPLETE 4+ VIEW  Comparison: CT abdomen 02/22/2012  Findings: Lateral projection of the lumbar spine demonstrates a new compression deformity of the superior endplate of the L4 vertebral body compared to CT of 02/22/2012.  There is approximately 10% loss of vertebral body heights centrally at this level.  No retropulsion.  There is mild anterolisthesis of L4-L5 which is unchanged.  There is a compression deformities at T12 which is unchanged from prior. Multilevel disc osteophytic disease.  IMPRESSION:  1.  Superior endplate compression deformity at L4 is new from 02/22/2012.  No retropulsion. 2.  Multilevel disc osteophytic disease unchanged from prior.   Original Report Authenticated By: Genevive Bi, M.D.    Assessment/Plan 1. L4 compression fracture- she requires admission for IV pain management- will continue Dilaudid IV. Oral pain medicines have not been sufficient at home and she is unable to bear weight. Will have OT see her about a TLSO brace to see if he can manage her pain with medicines and brace. If pain persists, we'll need to consider vertebroplasty. Did discuss some of the pros and cons of this approach. 2. Osteoporosis-this is her second fragility fracture. She will require treatment for osteoporosis. Forteo may not be an option due to her prior radiation. Defer to PCP.  We'll start Miacalcin help with pain control with the fracture. 3. Diarrhea-this is been a chronic issue presumed secondary to IBS. We'll continue Levbid, Colestid. It appears that she is on Entocort for colitis. 4. disposition-anticipate discharge to home once her pain is  controlled.  Britni Driscoll,W DOUGLAS 07/25/2012, 7:20 PM

## 2012-07-25 NOTE — ED Notes (Signed)
PER EMS- pt picked up from home with c/o back pain and bilateral hip pain x4 days. Pt reports pain started after she bent over to pick up a dog gate.  Pt alert and oriented x3.  22g LAC placed PTA.

## 2012-07-25 NOTE — ED Provider Notes (Signed)
History     CSN: [id number]  Arrival date & time 07/25/12  1345   First MD Initiated Contact with Patient 07/25/12 1357      Chief Complaint  Patient presents with  . Back Pain  . Hip Pain    (Consider location/radiation/quality/duration/timing/severity/associated sxs/prior treatment) HPI Comments:  Patient presents to ER for evaluation of low back pain. Patient reports that she bent over 3 days ago and felt sudden pain in her lower back. Since then she has had progressively worsening pain. She does not take pain medication. She has been lying in bed and symptoms are worsening. Pain radiates to the back and hip area but not into the legs. No numbness, tingling or weakness in the lower extremities. No change in bowel or bladder function.  Patient is a 78 y.o. female presenting with back pain and hip pain.  Back Pain Hip Pain    Past Medical History  Diagnosis Date  . IBS (irritable bowel syndrome)   . Fibromyalgia   . History of DVT (deep vein thrombosis)   . History of pulmonary embolism   . Osteoporosis   . History of breast cancer LEFT BREAST DCIS  1996  . Hyperlipidemia   . 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 Breast cancer RIGHT SIDE-- DX OCT 2011    CHEMORADIATION THERAPY-- COMPLETED   . PONV (postoperative nausea and vomiting)   . Skin tear LEFT ARM COVERED W/ TEGADERM    PT STATES HX MULTIPLE SKIN TEARS -- THIN  . Thin skin SECONARDY AGE AND HX CHEMO  . Ecchymosis   . Seasonal allergies   . Frequency of urination   . Nocturia   . Anemia   . Lung mass PER DR CLANCE NOTE UNCLEAR IF LYMPHOMA FROM BX    FOLLOWED BY DR RUBIN  . Seizures   . Unspecified hereditary and idiopathic peripheral neuropathy   . Asthma   . Fibromyalgia     Past Surgical History  Procedure Laterality Date  . Total abdominal hysterectomy w/ bilateral salpingoophorectomy  1977  (APPROX)  . Cholecystectomy    . Appendectomy    . Femur fracture  surgery  12-18-2010  DR BEANE    INTRAMEDULLARY NAILING LEFT INTERTROCHANTERIC -SUBTROCHANTERIC FX  . Right breast lumpectomy / removal lymph nodes x2/ removal pac  10-01-2010    CARCINOMA RIGHT BREAST  . Placement port- a- cath  02-18-2010  . Bronchoscopy  01-29-2010    W/ BX  . Tvt vaginal tape  suburethral sling   06-08-2005    SUI  . Bilateral laminectomy/ foraminotomy  01-14-2004    L4 - L5  . Breast lumpectomy  1996     LEFT BREAST DCIS   . Cataract extraction w/ intraocular lens  implant, bilateral    . Bilateral carpal tunnel release    . Tonsillectomy    . Transthoracic echocardiogram  02-25-2010    LVSF NORMAL/ EF 55-60%/ GRADE I DIASTOLIC DYSFUNCTION/ MILDLY DILATED LEFT ATRIUM  . Hardware removal  10/11/2011    Procedure: HARDWARE REMOVAL;  Surgeon: Javier Docker, MD;  Location: [facility];  Service: Orthopedics;  Laterality: Left;  LEFT THIGH REMOVAL OF DISTAL LOCKING SCREW NEEDS: FLORO, RADIO LUSCENT TABLE AND SCREWDRIVER FOR BIOMET ROD   . Colonoscopy      Family History  Problem Relation Age of Onset  . Emphysema Father   . Ovarian cancer Mother   . Cancer Mother  cervical  . Breast cancer Maternal Grandmother   . Cancer Sister     cervical    History  Substance Use Topics  . Smoking status: Former Smoker -- 1.00 packs/day for 30 years    Types: Cigarettes    Quit date: 03/01/1978  . Smokeless tobacco: Never Used  . Alcohol Use: 4.2 oz/week    7 Glasses of wine per week    OB History   Grav Para Term Preterm Abortions TAB SAB Ect Mult Living                  Review of Systems  Musculoskeletal: Positive for back pain.  Neurological: Negative.   All other systems reviewed and are negative.    Allergies  Penicillins; Adhesive; Doxycycline; and Morphine and related  Home Medications   Current Outpatient Rx  Name  Route  Sig  Dispense  Refill  . Ascorbic Acid (VITAMIN C) 1000 MG tablet   Oral   Take 1,000 mg by mouth  daily.         Marland Kitchen dexlansoprazole (DEXILANT) 60 MG capsule   Oral   Take 60 mg by mouth 2 (two) times daily.          . DULoxetine (CYMBALTA) 60 MG capsule   Oral   Take 1 capsule (60 mg total) by mouth daily.   90 capsule   1   . ergocalciferol (VITAMIN D2) 50000 UNITS capsule   Oral   Take 50,000 Units by mouth once a week. tuesday         . ezetimibe-simvastatin (VYTORIN) 10-80 MG per tablet   Oral   Take 1 tablet by mouth at bedtime.          . fluticasone (FLONASE) 50 MCG/ACT nasal spray   Nasal   Place 2 sprays into the nose daily.   16 g   2   . gabapentin (NEURONTIN) 100 MG capsule   Oral   Take 300 mg by mouth See admin instructions. 3 in the morning and if needed 3 at bedtime         . GRAPE SEED EXTRACT PO   Oral   Take 1 capsule by mouth at bedtime.         . hyoscyamine (LEVBID) 0.375 MG 12 hr tablet   Oral   Take 1 tablet by mouth every 12 (twelve) hours.         Marland Kitchen KLOR-CON M20 20 MEQ tablet   Oral   Take 1 tablet by mouth daily.         Marland Kitchen levETIRAcetam (KEPPRA) 500 MG tablet   Oral   Take 1 tablet (500 mg total) by mouth 2 (two) times daily.   180 tablet   3   . levothyroxine (SYNTHROID, LEVOTHROID) 88 MCG tablet   Oral   Take 88 mcg by mouth every morning.          Marland Kitchen LORazepam (ATIVAN) 0.5 MG tablet   Oral   Take 1 tablet (0.5 mg total) by mouth every 8 (eight) hours as needed. Anxiety   30 tablet   0     Patient to contact PCP for further refills.   . meloxicam (MOBIC) 15 MG tablet   Oral   Take 15 mg by mouth daily as needed for pain. pain         . metroNIDAZOLE (METROGEL) 1 % gel   Topical   Apply 1 application topically daily. Applied to face for rosacea.         Marland Kitchen  MICRONIZED COLESTIPOL HCL 1 G tablet   Oral   Take 1 tablet by mouth daily.         Marland Kitchen SALINE NASAL MIST NA   Nasal   Place 1 spray into the nose every 12 (twelve) hours.         . traMADol (ULTRAM) 50 MG tablet   Oral   Take 50 mg by  mouth every 6 (six) hours as needed for pain.         Marland Kitchen UCERIS 9 MG TB24   Oral   Take 1 capsule by mouth every 12 (twelve) hours.         Marland Kitchen VITAMIN E PO   Oral   Take 400 mg by mouth daily.           Pulse 84  Temp(Src) 98.8 F (37.1 C) (Oral)  Resp 18  Ht 5' 6.5" (1.689 m)  Wt 167 lb (75.751 kg)  BMI 26.55 kg/m2  SpO2 100%  Physical Exam  Constitutional: She is oriented to person, place, and time. She appears well-developed and well-nourished. No distress.  HENT:  Head: Normocephalic and atraumatic.  Right Ear: Hearing normal.  Left Ear: Hearing normal.  Nose: Nose normal.  Mouth/Throat: Oropharynx is clear and moist and mucous membranes are normal.  Eyes: Conjunctivae and EOM are normal. Pupils are equal, round, and reactive to light.  Neck: Normal range of motion. Neck supple.  Cardiovascular: Regular rhythm, S1 normal and S2 normal.  Exam reveals no gallop and no friction rub.   No murmur heard. Pulmonary/Chest: Effort normal and breath sounds normal. No respiratory distress. She exhibits no tenderness.  Abdominal: Soft. Normal appearance and bowel sounds are normal. There is no hepatosplenomegaly. There is no tenderness. There is no rebound, no guarding, no tenderness at McBurney's point and negative Murphy's sign. No hernia.  Musculoskeletal: Normal range of motion.       Lumbar back: She exhibits tenderness.  Neurological: She is alert and oriented to person, place, and time. She has normal strength. No cranial nerve deficit or sensory deficit. Coordination normal. GCS eye subscore is 4. GCS verbal subscore is 5. GCS motor subscore is 6.  Reflex Scores:      Patellar reflexes are 1+ on the right side and 1+ on the left side. Skin: Skin is warm, dry and intact. No rash noted. No cyanosis.  Psychiatric: She has a normal mood and affect. Her speech is normal and behavior is normal. Thought content normal.    ED Course  Procedures (including critical care  time)  Labs Reviewed  POCT I-STAT, CHEM 8 - Abnormal; Notable for the following:    Potassium 3.1 (*)    Calcium, Ion 0.97 (*)    All other components within normal limits   No results found.   Diagnosis: Back pain    MDM  Patient presents to the ER for evaluation of low back pain. Patient had sudden onset of sharp pain in the lower back after bending over and tried to pick something up. She has not had any focal neurologic findings. Patient treated with analgesia here in the ER. Patient has normal strength reflexes in lower extremities, no lack of sensation or saddle anesthesia. She is able to ambulate here in the ER after analgesia. She was, however, still complaining of significant pain. Lumbar x-ray was ordered and the case was signed out to Doctor [facility] followup for x-ray. It was anticipated that if there was a compression fracture, this could  be treated with analgesia. Was also discussed that the patient might need MRI, but that the x-ray will be performed first and then further considerations may based on those results.       Gilda Crease, MD 07/26/12 [phone]

## 2012-07-25 NOTE — ED Notes (Signed)
Pt ambulated in the hall with significant assistance and stated that she was in pain.

## 2012-07-25 NOTE — ED Provider Notes (Signed)
Plain film showed a new superior endplate fracture. When speaking with the patient and her family her pain is severe and she is having difficulty moving around in her home. She is unable to go to the bathroom or even get up out of a chair without assistance. Feel she would benefit from admission for pain control and physical therapy  Gwyneth Sprout, MD 07/25/12 [phone]

## 2012-07-26 ENCOUNTER — Inpatient Hospital Stay (HOSPITAL_COMMUNITY): Payer: Medicare Other

## 2012-07-26 ENCOUNTER — Other Ambulatory Visit: Payer: Self-pay | Admitting: *Deleted

## 2012-07-26 DIAGNOSIS — D649 Anemia, unspecified: Secondary | ICD-10-CM

## 2012-07-26 DIAGNOSIS — R197 Diarrhea, unspecified: Secondary | ICD-10-CM | POA: Diagnosis not present

## 2012-07-26 DIAGNOSIS — M810 Age-related osteoporosis without current pathological fracture: Secondary | ICD-10-CM | POA: Diagnosis not present

## 2012-07-26 DIAGNOSIS — S32009A Unspecified fracture of unspecified lumbar vertebra, initial encounter for closed fracture: Secondary | ICD-10-CM | POA: Diagnosis not present

## 2012-07-26 DIAGNOSIS — R569 Unspecified convulsions: Secondary | ICD-10-CM

## 2012-07-26 DIAGNOSIS — R0683 Snoring: Secondary | ICD-10-CM

## 2012-07-26 DIAGNOSIS — J302 Other seasonal allergic rhinitis: Secondary | ICD-10-CM

## 2012-07-26 DIAGNOSIS — R918 Other nonspecific abnormal finding of lung field: Secondary | ICD-10-CM

## 2012-07-26 DIAGNOSIS — G609 Hereditary and idiopathic neuropathy, unspecified: Secondary | ICD-10-CM

## 2012-07-26 DIAGNOSIS — R350 Frequency of micturition: Secondary | ICD-10-CM

## 2012-07-26 DIAGNOSIS — R234 Changes in skin texture: Secondary | ICD-10-CM

## 2012-07-26 DIAGNOSIS — R58 Hemorrhage, not elsewhere classified: Secondary | ICD-10-CM

## 2012-07-26 DIAGNOSIS — R351 Nocturia: Secondary | ICD-10-CM

## 2012-07-26 LAB — BASIC METABOLIC PANEL
BUN: 13 mg/dL (ref 6–23)
CO2: 30 mEq/L (ref 19–32)
Calcium: 9.6 mg/dL (ref 8.4–10.5)
Creatinine, Ser: 0.86 mg/dL (ref 0.50–1.10)
GFR calc non Af Amer: 64 mL/min — ABNORMAL LOW (ref >90)
Glucose, Bld: 119 mg/dL — ABNORMAL HIGH (ref 70–99)

## 2012-07-26 LAB — CBC
HCT: 38.4 % (ref 36.0–46.0)
Hemoglobin: 12.1 g/dL (ref 12.0–15.0)
MCH: 30.3 pg (ref 26.0–34.0)
MCHC: 31.5 g/dL (ref 30.0–36.0)
MCV: 96.2 fL (ref 78.0–100.0)
RBC: 3.99 MIL/uL (ref 3.87–5.11)

## 2012-07-26 NOTE — Evaluation (Signed)
Physical Therapy Evaluation Patient Details Name: [name] MRN: [id number] DOB: [birth year] Today's Date: 07/26/2012 Time: 7829-5621 PT Time Calculation (min): 34 min  PT Assessment / Plan / Recommendation Clinical Impression  Pt presents with L4 compression fracture with history of IM nail in LLE two years ago.  Tolerated OOB and ambulation in hallway very well at min assist with RW.  She did c/o 9/10 pain, however was premedicated prior to session.  Pt will benefit from skilled PT in acute venue to address deficits.  PT recommends HHPT for follow up at D/C to maximize pts safety and function.  PT feels that pt will not need TLSO due to pt able to move with back straight and feel it may be more hinder her more than help.  May benefit from coresett brace for pain control.     PT Assessment  Patient needs continued PT services    Follow Up Recommendations  Home health PT;Supervision for mobility/OOB    Does the patient have the potential to tolerate intense rehabilitation      Barriers to Discharge None      Equipment Recommendations  None recommended by PT    Recommendations for Other Services     Frequency Min 4X/week    Precautions / Restrictions Precautions Precautions: Back Precaution Comments: non surgical at this time, being conservative with back motions since pt presents with L4 compression fx. Also note pt has history of seizures Restrictions Weight Bearing Restrictions: No   Pertinent Vitals/Pain 9/10 pain      Mobility  Bed Mobility Bed Mobility: Rolling Left;Left Sidelying to Sit Rolling Left: 4: Min assist;With rail Left Sidelying to Sit: 4: Min assist;HOB flat;With rails Details for Bed Mobility Assistance: Min assist to ensure safety of trunk when rolling/sitting up with cues for log rolling and technique to maintain back in straight alignment.  Transfers Transfers: Sit to Stand;Stand to Sit Sit to Stand: With upper extremity assist;From bed Stand  to Sit: 4: Min assist;With upper extremity assist;With armrests;To chair/3-in-1 Details for Transfer Assistance: Assist to rise, steady and ensure controlled descent with cues for hand placement and maintaining back in straight alignment.  Ambulation/Gait Ambulation/Gait Assistance: 4: Min assist Ambulation Distance (Feet): 180 Feet Assistive device: Rolling walker Ambulation/Gait Assistance Details: Cues for maintaining position inside of RW and for upright posture.  Noted that pt would take hands off RW at times or turn to talk to somewhat causing some mild balance issues, but able self correct Gait Pattern: Step-through pattern;Decreased stride length Gait velocity: decreased Stairs: No Wheelchair Mobility Wheelchair Mobility: No    Exercises     PT Diagnosis: Difficulty walking;Generalized weakness;Acute pain  PT Problem List: Decreased strength;Decreased activity tolerance;Decreased balance;Decreased mobility;Decreased knowledge of use of DME;Decreased safety awareness;Decreased knowledge of precautions;Pain PT Treatment Interventions: DME instruction;Gait training;Stair training;Functional mobility training;Therapeutic activities;Therapeutic exercise;Balance training;Patient/family education   PT Goals Acute Rehab PT Goals PT Goal Formulation: With patient/family Time For Goal Achievement: 08/02/12 Potential to Achieve Goals: Good Pt will Roll Supine to Right Side: with supervision PT Goal: Rolling Supine to Right Side - Progress: Goal set today Pt will Roll Supine to Left Side: with supervision PT Goal: Rolling Supine to Left Side - Progress: Goal set today Pt will go Supine/Side to Sit: with supervision PT Goal: Supine/Side to Sit - Progress: Goal set today Pt will go Sit to Supine/Side: with supervision PT Goal: Sit to Supine/Side - Progress: Goal set today Pt will go Sit to Stand: with supervision PT Goal:  Sit to Stand - Progress: Goal set today Pt will Ambulate: >150  feet;with supervision;with least restrictive assistive device PT Goal: Ambulate - Progress: Goal set today Pt will Go Up / Down Stairs: 1-2 stairs;with supervision;with least restrictive assistive device PT Goal: Up/Down Stairs - Progress: Goal set today  Visit Information  Last PT Received On: 07/26/12 Assistance Needed: +1    Subjective Data  Subjective: I'm okay if I'm completely still Patient Stated Goal: to return home.    Prior Functioning  Home Living Lives With: Spouse Available Help at Discharge: Family;Available 24 hours/day Type of Home: House Home Access: Stairs to enter [employer] of Steps: 2 Entrance Stairs-Rails: None Home Layout: One level Bathroom Shower/Tub: Engineer, manufacturing systems: Handicapped height Home Adaptive Equipment: Tub transfer bench;Straight cane;Bedside commode/3-in-1;Walker - rolling Prior Function Level of Independence: Independent with assistive device(s) (with cane) Able to Take Stairs?: Yes Communication Communication: No difficulties Dominant Hand: Right    Cognition  Cognition Arousal/Alertness: Awake/alert Behavior During Therapy: WFL for tasks assessed/performed Overall Cognitive Status: Within Functional Limits for tasks assessed    Extremity/Trunk Assessment Right Lower Extremity Assessment RLE ROM/Strength/Tone: WFL for tasks assessed RLE Sensation: WFL - Light Touch Left Lower Extremity Assessment LLE ROM/Strength/Tone: Deficits LLE ROM/Strength/Tone Deficits: Pts strength mostly WFL, however note some weakness due to femur fracture s/p IM nail two years ago.  LLE Sensation: WFL - Light Touch Trunk Assessment Trunk Assessment: Normal   Balance    End of Session PT - End of Session Activity Tolerance: Patient tolerated treatment well;Patient limited by pain Patient left: in chair;with call bell/phone within reach;with family/visitor present Nurse Communication: Mobility status  GP     Vista Deck 07/26/2012, 12:30 PM

## 2012-07-26 NOTE — Evaluation (Signed)
Occupational Therapy Evaluation Patient Details Name: [name] MRN: [id number] DOB: 11-27-1934 Today's Date: 07/26/2012 Time: 4098-1191 OT Time Calculation (min): 34 min  OT Assessment / Plan / Recommendation Clinical Impression  Pt is s/p L4 compression fx which is being treated conservatively without surgery. Pt medicated at start of OT/PT session so she moved well considering 9/10 pain level. Pt states she has good family support for d/c home. Do not feel she needs a TLSO brace  as OT feels it would hinder her ability to perform ADL more than it would help. She performs functional mobility at min assist level. Needs more assist with LB ADL due to not being able to cross both LEs up.     OT Assessment  Patient needs continued OT Services    Follow Up Recommendations  Home health OT;Supervision/Assistance - 24 hour    Barriers to Discharge      Equipment Recommendations  None recommended by OT    Recommendations for Other Services    Frequency  Min 2X/week    Precautions / Restrictions Precautions Precautions: Back Precaution Comments: non surgical at this time, being conservative with back motions since pt presents with L4 compression fx. Also note pt has history of seizures Restrictions Weight Bearing Restrictions: No        ADL  Eating/Feeding: Simulated;Independent Where Assessed - Eating/Feeding: Chair Grooming: Simulated;Wash/dry hands;Set up Where Assessed - Grooming: Supported sitting Upper Body Bathing: Simulated;Chest;Right arm;Left arm;Abdomen;Supervision/safety;Set up Where Assessed - Upper Body Bathing: Unsupported sitting Lower Body Bathing: Simulated;Moderate assistance Where Assessed - Lower Body Bathing: Supported sit to stand Upper Body Dressing: Simulated;Minimal assistance Where Assessed - Upper Body Dressing: Unsupported sitting Lower Body Dressing: Simulated;Moderate assistance Where Assessed - Lower Body Dressing: Supported sit to  stand Toilet Transfer: Mining engineer Method: Other (comment) (with walker ) Toileting - Clothing Manipulation and Hygiene: Simulated;Minimal assistance (for clothing only. didnt assess toilet hygiene yet) Where Assessed - Toileting Clothing Manipulation and Hygiene: Sit to stand from 3-in-1 or toilet Equipment Used: Rolling walker ADL Comments: Educated on AE options but pt states her husband can help with LB ADL at d/c. She is able to cross her leg up on the R side but has difficulty with crossing L LE up. She had an IM nail of L LE 2 years ago. Pt moving well considering her pain level. She had just received pain meds at start therapy session.    OT Diagnosis: Generalized weakness;Acute pain  OT Problem List: Decreased strength;Pain;Decreased knowledge of precautions;Decreased knowledge of use of DME or AE OT Treatment Interventions: Self-care/ADL training;DME and/or AE instruction;Therapeutic activities;Patient/family education   OT Goals Acute Rehab OT Goals OT Goal Formulation: With patient Time For Goal Achievement: 08/02/12 Potential to Achieve Goals: Good ADL Goals Pt Will Perform Grooming: with supervision;Standing at sink ADL Goal: Grooming - Progress: Goal set today Pt Will Transfer to Toilet: with supervision;Ambulation;3-in-1;with DME ADL Goal: Toilet Transfer - Progress: Goal set today Pt Will Perform Toileting - Clothing Manipulation: with supervision;Standing ADL Goal: Toileting - Clothing Manipulation - Progress: Goal set today Pt Will Perform Toileting - Hygiene: with supervision;with adaptive equipment;Sit to stand from 3-in-1/toilet (toilet aid PRN') ADL Goal: Toileting - Hygiene - Progress: Goal set today Additional ADL Goal #1: Pt will perform log roll technique to transfer to EOB/back in bed with supervison in prep for ADL.  ADL Goal: Additional Goal #1 - Progress: Goal set today  Visit Information  Last OT Received On:  07/26/12 Assistance Needed: +  1 PT/OT Co-Evaluation/Treatment: Yes    Subjective Data  Subjective: I want to do for myself Patient Stated Goal: wants to return to as much independence as possible   Prior Functioning     Home Living Lives With: Spouse Available Help at Discharge: Family;Available 24 hours/day Type of Home: House Home Access: Stairs to enter [employer] of Steps: 2 Entrance Stairs-Rails: None Home Layout: One level Bathroom Shower/Tub: Engineer, manufacturing systems: Handicapped height Home Adaptive Equipment: Tub transfer bench;Straight cane;Bedside commode/3-in-1;Walker - rolling Prior Function Level of Independence: Independent with assistive device(s) (with cane. husband always near when pt up.) Able to Take Stairs?: Yes Communication Communication: No difficulties Dominant Hand: Right         Vision/Perception     Cognition  Cognition Arousal/Alertness: Awake/alert Behavior During Therapy: WFL for tasks assessed/performed Overall Cognitive Status: Within Functional Limits for tasks assessed    Extremity/Trunk Assessment Right Upper Extremity Assessment RUE ROM/Strength/Tone: Within functional levels Left Upper Extremity Assessment LUE ROM/Strength/Tone: Deficits LUE ROM/Strength/Tone Deficits: only about 120 degrees shoulder flexion; other joints WFL except grip is fair Right Lower Extremity Assessment RLE ROM/Strength/Tone: WFL for tasks assessed RLE Sensation: WFL - Light Touch Left Lower Extremity Assessment LLE ROM/Strength/Tone: Deficits LLE ROM/Strength/Tone Deficits: Pts strength mostly WFL, however note some weakness due to femur fracture s/p IM nail two years ago.  LLE Sensation: WFL - Light Touch Trunk Assessment Trunk Assessment: Normal     Mobility Bed Mobility Bed Mobility: Rolling Left;Left Sidelying to Sit Rolling Left: 4: Min assist;With rail Left Sidelying to Sit: 4: Min assist;HOB flat;With rails Details  for Bed Mobility Assistance: Min assist to ensure safety of trunk when rolling/sitting up with cues for log rolling and technique to maintain back in straight alignment.  Transfers Transfers: Sit to Stand;Stand to Sit Sit to Stand: With upper extremity assist;From bed;4: Min assist Stand to Sit: 4: Min assist;With upper extremity assist;With armrests;To chair/3-in-1 Details for Transfer Assistance: Assist to rise, steady and ensure controlled descent with cues for hand placement and maintaining back in straight alignment.      Exercise     Balance     End of Session OT - End of Session Activity Tolerance: Patient tolerated treatment well (despite high pain level) Patient left: in chair;with call bell/phone within reach;with family/visitor present  GO     Lennox Laity [phone] 07/26/2012, 12:54 PM

## 2012-07-26 NOTE — Progress Notes (Signed)
Subjective: Back pain is a bit better. No radiation into buttocks or either leg. No bowel or bladder control issues. Feet are no more numb than usual from neuropathy. Breathing is better with oxygen  Objective: Vital signs in last 24 hours: Temp:  [97.6 F (36.4 C)-98.8 F (37.1 C)] 98.5 F (36.9 C) (05/28 0517) Pulse Rate:  [73-84] 73 (05/28 0517) Resp:  [14-18] 14 (05/28 0517) BP: (131-163)/(68-93) 133/78 mmHg (05/28 0517) SpO2:  [94 %-100 %] 96 % (05/28 0517) Weight:  [75.751 kg (167 lb)] 75.751 kg (167 lb) (05/27 1353)  Intake/Output from previous day: 05/27 0701 - 05/28 0700 In: 240 [P.O.:240] Out: -  Intake/Output this shift:    General: fatigued round face with red cheeks. O2 in place, neck supple. Lungs diminished no wheeze. Ht regular distant. abd soft NT, sl distended. Awake. Alert, mentating well. Dorsiflexion and plantar flexion intact and symmetric, diminished sensation. Sl reduced pulses  Lab Results   Recent Labs  07/25/12 1449 07/26/12 0425  WBC  --  5.3  RBC  --  3.99  HGB 12.2 12.1  HCT 36.0 38.4  MCV  --  96.2  MCH  --  30.3  RDW  --  12.6  PLT  --  183    Recent Labs  07/25/12 1449 07/26/12 0425  NA 137 140  K 3.1* 4.1  CL 107 104  CO2  --  30  GLUCOSE 92 119*  BUN 17 13  CREATININE 0.90 0.86  CALCIUM  --  9.6    Studies/Results: Dg Lumbar Spine Complete  07/25/2012   *RADIOLOGY REPORT*  Clinical Data: Fall, back pain  LUMBAR SPINE - COMPLETE 4+ VIEW  Comparison: CT abdomen 02/22/2012  Findings: Lateral projection of the lumbar spine demonstrates a new compression deformity of the superior endplate of the L4 vertebral body compared to CT of 02/22/2012.  There is approximately 10% loss of vertebral body heights centrally at this level.  No retropulsion.  There is mild anterolisthesis of L4-L5 which is unchanged.  There is a compression deformities at T12 which is unchanged from prior. Multilevel disc osteophytic disease.  IMPRESSION:  1.   Superior endplate compression deformity at L4 is new from 02/22/2012.  No retropulsion. 2.  Multilevel disc osteophytic disease unchanged from prior.   Original Report Authenticated By: Genevive Bi, M.D.    Scheduled Meds: . budesonide  9 mg Oral Q12H  . calcitonin (salmon)  1 spray Alternating Nares Daily  . colestipol  1 g Oral Daily  . DULoxetine  60 mg Oral Daily  . enoxaparin (LOVENOX) injection  40 mg Subcutaneous Q24H  . ezetimibe-simvastatin  1 tablet Oral QHS  . fluticasone  2 spray Each Nare Daily  . gabapentin  300 mg Oral Daily  . hyoscyamine  0.375 mg Oral Q12H  . levETIRAcetam  500 mg Oral BID  . levothyroxine  88 mcg Oral QAC breakfast  . pantoprazole  40 mg Oral Daily  . pneumococcal 23 valent vaccine  0.5 mL Intramuscular Tomorrow-1000  . potassium chloride SA  20 mEq Oral Daily  . sodium chloride  3 mL Intravenous Q12H  . vitamin C  1,000 mg Oral Daily  . Vitamin D (Ergocalciferol)  50,000 Units Oral Weekly   Continuous Infusions:  PRN Meds:sodium chloride, acetaminophen, acetaminophen, gabapentin, HYDROmorphone (DILAUDID) injection, LORazepam, ondansetron (ZOFRAN) IV, ondansetron, oxyCODONE-acetaminophen, sodium chloride, traMADol  Assessment/Plan: L4 compression fx, acute: still having pain. Check CT to look at this in more detail. May be a  candidate for vertebroplasty. on miacalcin Hypothyroid: on Rx Neuropathy: continue Rx Depression: continue Rx Breast CA: doubt this is related to spinal issue Hypokalemia: improved Hyperlipidemia: On Rx    LOS: 1 day   Lewin Pellow [name] 07/26/2012, 8:42 AM

## 2012-07-27 ENCOUNTER — Encounter: Payer: Self-pay | Admitting: *Deleted

## 2012-07-27 ENCOUNTER — Telehealth: Payer: Self-pay | Admitting: *Deleted

## 2012-07-27 DIAGNOSIS — M810 Age-related osteoporosis without current pathological fracture: Secondary | ICD-10-CM | POA: Diagnosis not present

## 2012-07-27 DIAGNOSIS — R197 Diarrhea, unspecified: Secondary | ICD-10-CM | POA: Diagnosis not present

## 2012-07-27 DIAGNOSIS — S32009A Unspecified fracture of unspecified lumbar vertebra, initial encounter for closed fracture: Secondary | ICD-10-CM | POA: Diagnosis not present

## 2012-07-27 MED ORDER — COSYNTROPIN 0.25 MG IJ SOLR
0.2500 mg | Freq: Once | INTRAMUSCULAR | Status: AC
  Administered 2012-07-28: 0.25 mg via INTRAVENOUS
  Filled 2012-07-27: qty 0.25

## 2012-07-27 MED ORDER — POLYETHYLENE GLYCOL 3350 17 G PO PACK
17.0000 g | PACK | Freq: Every day | ORAL | Status: DC
  Administered 2012-07-27 – 2012-07-28 (×2): 17 g via ORAL

## 2012-07-27 NOTE — Telephone Encounter (Signed)
Called and spoke with patient's husband who explained she is in the hospital and will be for a couple more days.  I explained I was just calling to give results from her sleep study.  I will mail the results to the home and Lessie can review them when she gets home and call if she has any questions. -sh

## 2012-07-27 NOTE — Progress Notes (Signed)
Occupational Therapy Treatment Patient Details Name: [name] MRN: [id number] DOB: 10/31/1934 Today's Date: 07/27/2012 Time: 9528-4132 OT Time Calculation (min): 39 min  OT Assessment / Plan / Recommendation Comments on Treatment Session Education provided on toilet aid and demonstrated use for pt. She is interested in obtaining toilet aid so explained coverage and where to obtain. TLSO used with functional mobility OOB today and pt pleased with it. States pain level 5 at rest and 7/10 with activity which is improved from yesterday.     Follow Up Recommendations  Home health OT;Supervision/Assistance - 24 hour    Barriers to Discharge       Equipment Recommendations  None recommended by OT    Recommendations for Other Services    Frequency Min 2X/week   Plan Discharge plan remains appropriate    Precautions / Restrictions Precautions Precautions: Back Precaution Comments: L4 compression fx. following/encouraging back precautions Required Braces or Orthoses: Spinal Brace Spinal Brace: Thoracolumbosacral orthotic;Other (comment) Spinal Brace Comments: for ambulation        ADL  Toilet Transfer: Performed;Minimal assistance Toilet Transfer Method: Other (comment) (with walker into bathroom) Toilet Transfer Equipment: Raised toilet seat with arms (or 3-in-1 over toilet) Toileting - Clothing Manipulation and Hygiene: Performed;Minimal assistance (for front periarea only. see details below) Where Assessed - Toileting Clothing Manipulation and Hygiene: Sit to stand from 3-in-1 or toilet Equipment Used: Rolling walker ADL Comments: Pt can be very chatty so it is difficult at times to interject and provide education. Introduced toilet aid to pt as she cant reach posterior periarea without twisting. She did well with bending knees slightly in standing to reach front periarea however. Pt interested in toilet aid and educated both her and husband on where to obtain. Pt with some  difficulty managing underwear as the TLSO brace was close to where underwear pulls up to. Min assist to balance for hygiene and clothing manipulation and to help manage underwear with brace on. Pt states the brace "feels good" on. Showed pt and spouse how to adjust the brace for proper fit/support.     OT Diagnosis:    OT Problem List:   OT Treatment Interventions:     OT Goals Acute Rehab OT Goals OT Goal Formulation: With patient Time For Goal Achievement: 08/02/12 Potential to Achieve Goals: Good ADL Goals Pt Will Perform Grooming: with supervision;Standing at sink Pt Will Transfer to Toilet: with supervision;Ambulation;3-in-1;with DME ADL Goal: Toilet Transfer - Progress: Progressing toward goals Pt Will Perform Toileting - Clothing Manipulation: with supervision;Standing ADL Goal: Toileting - Clothing Manipulation - Progress: Progressing toward goals Pt Will Perform Toileting - Hygiene: with supervision;with adaptive equipment;Sit to stand from 3-in-1/toilet (toilet aid PRN') ADL Goal: Toileting - Hygiene - Progress: Progressing toward goals Additional ADL Goal #1: Pt will perform log roll technique to transfer to EOB/back in bed with supervison in prep for ADL.  ADL Goal: Additional Goal #1 - Progress: Progressing toward goals  Visit Information  Last OT Received On: 07/27/12 Assistance Needed: +1 PT/OT Co-Evaluation/Treatment: Yes    Subjective Data  Subjective: I am doing better today Patient Stated Goal: wants to do for herself   Prior Functioning       Cognition  Cognition Arousal/Alertness: Awake/alert Behavior During Therapy: WFL for tasks assessed/performed Overall Cognitive Status: Within Functional Limits for tasks assessed    Mobility  Bed Mobility Bed Mobility: Rolling Left;Left Sidelying to Sit Rolling Left: 4: Min assist;With rail Left Sidelying to Sit: 4: Min assist;HOB flat;With rails Details  for Bed Mobility Assistance: Min assist to ensure safety of  trunk when rolling/sitting up with cues for log rolling and technique to maintain back in straight alignment.  Transfers Transfers: Sit to Stand;Stand to Sit Sit to Stand: With upper extremity assist;From bed;4: Min assist;From chair/3-in-1 Stand to Sit: 4: Min assist;With upper extremity assist;With armrests;To chair/3-in-1 Details for Transfer Assistance: Assist to rise, steady and ensure controlled descent with cues for hand placement and maintaining back in straight alignment.     Exercises      Balance Balance Balance Assessed: Yes Dynamic Standing Balance Dynamic Standing - Level of Assistance: 4: Min assist   End of Session OT - End of Session Activity Tolerance: Patient tolerated treatment well Patient left: in chair;with call bell/phone within reach;with family/visitor present  GO     Lennox Laity [phone] 07/27/2012, 1:50 PM

## 2012-07-27 NOTE — Progress Notes (Addendum)
Subjective: Doing a bit better. Brace in room but not used yet Breathing better Back pain is somewhat better' constipated   Objective: Vital signs in last 24 hours: Temp:  [98.2 F (36.8 C)-98.3 F (36.8 C)] 98.2 F (36.8 C) (05/29 0452) Pulse Rate:  [68-84] 84 (05/29 0452) Resp:  [16] 16 (05/29 0452) BP: (114-165)/(76-90) 134/82 mmHg (05/29 0452) SpO2:  [90 %-95 %] 90 % (05/29 0452)  Intake/Output from previous day: 05/28 0701 - 05/29 0700 In: 720 [P.O.:720] Out: 1435 [Urine:1435] Intake/Output this shift:    General: no distress lying nearly flat. Lungs clear. Regular distant. abd soft, nontender  Lab Results   Recent Labs  07/25/12 1449 07/26/12 0425  WBC  --  5.3  RBC  --  3.99  HGB 12.2 12.1  HCT 36.0 38.4  MCV  --  96.2  MCH  --  30.3  RDW  --  12.6  PLT  --  183    Recent Labs  07/25/12 1449 07/26/12 0425  NA 137 140  K 3.1* 4.1  CL 107 104  CO2  --  30  GLUCOSE 92 119*  BUN 17 13  CREATININE 0.90 0.86  CALCIUM  --  9.6    Studies/Results: Dg Lumbar Spine Complete  07/25/2012   *RADIOLOGY REPORT*  Clinical Data: Fall, back pain  LUMBAR SPINE - COMPLETE 4+ VIEW  Comparison: CT abdomen 02/22/2012  Findings: Lateral projection of the lumbar spine demonstrates a new compression deformity of the superior endplate of the L4 vertebral body compared to CT of 02/22/2012.  There is approximately 10% loss of vertebral body heights centrally at this level.  No retropulsion.  There is mild anterolisthesis of L4-L5 which is unchanged.  There is a compression deformities at T12 which is unchanged from prior. Multilevel disc osteophytic disease.  IMPRESSION:  1.  Superior endplate compression deformity at L4 is new from 02/22/2012.  No retropulsion. 2.  Multilevel disc osteophytic disease unchanged from prior.   Original Report Authenticated By: Genevive Bi, M.D.   Ct Lumbar Spine Wo Contrast  07/26/2012   *RADIOLOGY REPORT*  Clinical Data: Low back pain after  lifting injury last week. Evaluate L4 fracture.  CT LUMBAR SPINE WITHOUT CONTRAST  Technique:  Multidetector CT imaging of the lumbar spine was performed without intravenous contrast administration. Multiplanar CT image reconstructions were also generated.  Comparison: Lumbar spine radiographs 07/25/2012, PET CT 03/14/2012 and abdominal CT 02/22/2012.  Findings: As demonstrated on the recent radiographs, there is a new superior endplate compression deformity at L4 resulting in approximately 10% loss of vertebral body height.  This fracture is associated with sharp cortical margins (best seen on the coronal and sagittal images) and is consistent with an acute or subacute fracture.  There is minimal associated osseous retropulsion.  The posterior elements are intact.  Chronic compression deformities at T12 and L1 appear stable.  No other new fractures are identified.  The alignment is stable with a degenerative anterolisthesis at 4-L5.  There is a small amount of anterior paraspinal hemorrhage.  No epidural hematoma is identified.  Aorto iliac atherosclerosis is noted.  Relatively mild degenerative changes are present throughout the spine.  There are no significant disc space findings at L1-L2 or L2- L3.  L3-L4:  Disc bulging, facet and ligamentous hypertrophy contribute to mild central stenosis.  There is no significant foraminal compromise.  L4-L5:  The patient has undergone previous right laminectomy at this level.  There is disc bulging with bilateral facet hypertrophy  accounting for the anterolisthesis.  Mild foraminal narrowing is present, left greater than right.  L5-S1:  Disc height is maintained.  There is bilateral facet hypertrophy without resulting foraminal compromise.  IMPRESSION:  1.  Acute/subacute mild superior endplate compression fracture at L4.  This demonstrates no pathologic features or significant osseous retropulsion. 2.  Old healed compression deformities at T12 and L1. 3.  Relatively mild  multilevel spondylosis as described status post surgery at L4-L5.  There is mild multifactorial spinal stenosis at L3-L4.   Original Report Authenticated By: Carey Bullocks, M.D.    Scheduled Meds: . budesonide  9 mg Oral Q12H  . calcitonin (salmon)  1 spray Alternating Nares Daily  . colestipol  1 g Oral Daily  . DULoxetine  60 mg Oral Daily  . enoxaparin (LOVENOX) injection  40 mg Subcutaneous Q24H  . ezetimibe-simvastatin  1 tablet Oral QHS  . fluticasone  2 spray Each Nare Daily  . gabapentin  300 mg Oral Daily  . hyoscyamine  0.375 mg Oral Q12H  . levETIRAcetam  500 mg Oral BID  . levothyroxine  88 mcg Oral QAC breakfast  . pantoprazole  40 mg Oral Daily  . potassium chloride SA  20 mEq Oral Daily  . sodium chloride  3 mL Intravenous Q12H  . vitamin C  1,000 mg Oral Daily  . Vitamin D (Ergocalciferol)  50,000 Units Oral Weekly   Continuous Infusions:  PRN Meds:sodium chloride, acetaminophen, acetaminophen, gabapentin, HYDROmorphone (DILAUDID) injection, LORazepam, ondansetron (ZOFRAN) IV, ondansetron, oxyCODONE-acetaminophen, sodium chloride, traMADol  Assessment/Plan:  L4 compression fx, acute: doing a bit better. Ambulate with brace and see if we have the pain under control. Hypothyroid: on Rx  Neuropathy: continue Rx  Depression: continue Rx  Breast CA: doubt this is related to spinal issue  Hypokalemia: improved  Hyperlipidemia: On Rx Constipation: Rx mirlaax Low cortisol: quite unusual, check cortrosyn stim   LOS: 2 days   [name] [name] 07/27/2012, 10:02 AM

## 2012-07-27 NOTE — Progress Notes (Signed)
Physical Therapy Treatment Patient Details Name: [name] MRN: [id number] DOB: [birth year] Today's Date: 07/27/2012 Time: 1100-1144 PT Time Calculation (min): 44 min  PT Assessment / Plan / Recommendation Comments on Treatment Session  pt progressing well; still with occasional LOB when letting go of walker, requires assist to  don TLSO; reviewed with pt and husband; continue to recommend HHPT    Follow Up Recommendations  Home health PT;Supervision for mobility/OOB     Does the patient have the potential to tolerate intense rehabilitation     Barriers to Discharge        Equipment Recommendations  None recommended by PT    Recommendations for Other Services    Frequency Min 4X/week   Plan Discharge plan remains appropriate;Frequency remains appropriate    Precautions / Restrictions Precautions Precautions: Back Precaution Comments: L4 compression fx. following/encouraging back precautions Required Braces or Orthoses: Spinal Brace Spinal Brace: Thoracolumbosacral orthotic;Other (comment) Spinal Brace Comments: for ambulation   Pertinent Vitals/Pain Pain better than yesterday per pt    Mobility  Bed Mobility Bed Mobility: Rolling Left;Left Sidelying to Sit Rolling Left: 4: Min assist;With rail Left Sidelying to Sit: 4: Min assist;HOB flat;With rails Details for Bed Mobility Assistance: Min assist to ensure safety of trunk when rolling/sitting up with cues for log rolling and technique to maintain back in straight alignment.  Transfers Transfers: Sit to Stand;Stand to Sit Sit to Stand: With upper extremity assist;From bed;4: Min assist Stand to Sit: 4: Min assist;With upper extremity assist;With armrests;To chair/3-in-1 Details for Transfer Assistance: Assist to rise, steady and ensure controlled descent with cues for hand placement and maintaining back in straight alignment.  Ambulation/Gait Ambulation/Gait Assistance: 4: Min guard;4: Min Administrator, sports (Feet): 340 Feet Assistive device: Rolling walker Ambulation/Gait Assistance Details: Cues for maintaining position inside of RW and for upright posture. Noted that pt would take hands off RW at times or turn to talk to somewhat causing some mild balance issues, but able self correct Gait Pattern: Step-through pattern;Decreased stride length    Exercises     PT Diagnosis:    PT Problem List:   PT Treatment Interventions:     PT Goals Acute Rehab PT Goals Time For Goal Achievement: 08/02/12 Potential to Achieve Goals: Good Pt will Roll Supine to Right Side: with supervision PT Goal: Rolling Supine to Right Side - Progress: Progressing toward goal Pt will Roll Supine to Left Side: with supervision PT Goal: Rolling Supine to Left Side - Progress: Progressing toward goal Pt will go Supine/Side to Sit: with supervision PT Goal: Supine/Side to Sit - Progress: Progressing toward goal Pt will go Sit to Supine/Side: with supervision PT Goal: Sit to Supine/Side - Progress: Progressing toward goal Pt will go Sit to Stand: with supervision PT Goal: Sit to Stand - Progress: Progressing toward goal Pt will Ambulate: >150 feet;with supervision;with least restrictive assistive device PT Goal: Ambulate - Progress: Progressing toward goal  Visit Information  Last PT Received On: 07/27/12 Assistance Needed: +1 PT/OT Co-Evaluation/Treatment: Yes    Subjective Data  Subjective: I want to walk Patient Stated Goal: to return home.    Cognition  Cognition Arousal/Alertness: Awake/alert Behavior During Therapy: WFL for tasks assessed/performed Overall Cognitive Status: Within Functional Limits for tasks assessed    Balance     End of Session PT - End of Session Equipment Utilized During Treatment: Gait belt;Back brace Activity Tolerance: Patient tolerated treatment well;Patient limited by pain Patient left: in chair;with call bell/phone within reach;with family/visitor  present    GP     Hunter Holmes [facility] 07/27/2012, 1:44 PM

## 2012-07-27 NOTE — Clinical Documentation Improvement (Signed)
PATHOLOGICAL FX DOCUMENTATION CLARIFICATION QUERY  THIS DOCUMENT IS NOT A PERMANENT PART OF THE MEDICAL RECORD  TO RESPOND TO THE THIS QUERY, FOLLOW THE INSTRUCTIONS BELOW:  1. If needed, update documentation for the patient's encounter via the notes activity.  2. Access this query again and click edit on the In [employer].  3. After updating, or not, click F2 to complete all highlighted (required) fields concerning your review. Select "additional documentation in the medical record" OR "no additional documentation provided".  4. Click Sign note button.  5. The deficiency will fall out of your In Basket *Please let us know if you are not able to complete this workflow by phone or e-mail (listed below).  Please update your documentation within the medical record to reflect your response to this query.                                                                                    07/27/12  Dear Dr. Rosalio Loud [facility]/ [facility],  In a better effort to capture your patient's severity of illness, reflect appropriate length of stay and utilization of resources, a review of the patient medical record has revealed the following indicators.    Based on your clinical judgment, please clarify and document in a progress note and/or discharge summary the clinical condition associated with the following supporting information:  In responding to this query please exercise your independent judgment.  The fact that a query is asked, does not imply that any particular answer is desired or expected.  Pt with L4 compression fracture  Clarification Needed   Please clarify if the L 4 compression fracture in setting of osteoporosis can be further specified as one of the diagnoses listed below and document in pn or d/c summary.     Possible Clinical Conditions?  _______Pathological Fracture _______Pathological Fracture due to slight trauma that is incongruent with injury _______Osteoporotic  Fracture _______Traumatic fracture not pathological in nature _______Insufficiency Fracture _______Stress/Non-Traumatic Fracture _______Spontaneous Fracture _______Compression Fx due to (please specify bone disease) _______Other Condition________________ _______Cannot Clinically Determine   Supporting Information:  Risk Factors:    * H/P  Osteoporosis-this is her second fragility fracture. She will require treatment for osteoporosis.   Diagnostics:  Lab:  Radiology: Lumbar Spine 07/25/12 IMPRESSION:  1.Superior endplate compression deformity at L4 is new from 02/22/2012.No retropulsion. 2.Multilevel disc osteophytic disease unchanged   Ct Lumbar Spine WO Contrast: 2.Old healed compression deformities at T12 and L1. 3.Relatively mild multilevel spondylosis as described status post surgery at L4-L5.There is mild multifactorial spinal stenosis at L3-L4.  Treatments Monitoring  Medications:   You may use possible, probable, or suspect with inpatient documentation. possible, probable, suspected diagnoses MUST be documented at the time of discharge  Reviewed:   Thank You,  Lolita J Henley  Lolita J. Ambrose Mantle RN, BSN, MSN/Inf, CCDS Clinical Documentation Specialist [facility] Dept Pager: [phone]  Health Information Management [place]  See dc summary

## 2012-07-27 NOTE — Progress Notes (Signed)
Pt has chosen [facility] to provide HHPT/OT services as recommended after evaluations by therapy. MD needs to order these services.  Algernon Huxley RN BSN  [phone]

## 2012-07-28 DIAGNOSIS — M810 Age-related osteoporosis without current pathological fracture: Secondary | ICD-10-CM | POA: Diagnosis not present

## 2012-07-28 DIAGNOSIS — S32009A Unspecified fracture of unspecified lumbar vertebra, initial encounter for closed fracture: Secondary | ICD-10-CM | POA: Diagnosis not present

## 2012-07-28 DIAGNOSIS — R197 Diarrhea, unspecified: Secondary | ICD-10-CM | POA: Diagnosis not present

## 2012-07-28 MED ORDER — CALCITONIN (SALMON) 200 UNIT/ACT NA SOLN: 1.0000 | Freq: Every day | NASAL | Status: DC

## 2012-07-28 MED ORDER — PREDNISONE 5 MG PO TABS: 5.0000 mg | ORAL_TABLET | Freq: Every day | ORAL | Status: DC

## 2012-07-28 MED ORDER — OXYCODONE-ACETAMINOPHEN 5-325 MG PO TABS: 1.0000 | ORAL_TABLET | ORAL | Status: DC | PRN

## 2012-07-28 MED ORDER — PREDNISONE 5 MG PO TABS
5.0000 mg | ORAL_TABLET | Freq: Every day | ORAL | Status: DC
  Administered 2012-07-28: 5 mg via ORAL
  Filled 2012-07-28 (×2): qty 1

## 2012-07-28 NOTE — Progress Notes (Signed)
Occupational Therapy Treatment Patient Details Name: [name] MRN: [id number] DOB: 31-Jul-1934 Today's Date: 07/28/2012 Time: 4098-1191 OT Time Calculation (min): 26 min  OT Assessment / Plan / Recommendation Comments on Treatment Session Pt with L4 compression fracture treated conservatively. She tolerated session well and caregiver education provided for how to manage brace as well as ADL tasks. Pt supposed to d/c today.    Follow Up Recommendations  Home health OT;Supervision/Assistance - 24 hour    Barriers to Discharge       Equipment Recommendations  None recommended by OT    Recommendations for Other Services    Frequency Min 2X/week   Plan Discharge plan remains appropriate    Precautions / Restrictions Precautions Precautions: Back Precaution Comments: L4 compression fx. following/encouraging back precautions. pt also with history of seizures Spinal Brace: Thoracolumbosacral orthotic;Other (comment) Spinal Brace Comments: for ambulation        ADL  Toilet Transfer: Performed;Min guard Toilet Transfer Method: Other (comment) (with walker to 3in1) Toilet Transfer Equipment: Raised toilet seat with arms (or 3-in-1 over toilet) Equipment Used: Rolling walker ADL Comments: Discussed safety techniques with pt/spouse including having walker right in front of her whenever she stands so she can use it to steady herself including pulling up pants at the commode or dressing. Reminded pt that if husband is helping with LB ADL that she should not bend forward to reach for underwear or pants. He needs to pull the clothing up to her knees before she reaches for it. Pt did well with toilet transfer today and only needed min cues to reach for armrests of 3in1 before sitting and to use to push up. Discussed using looser fitting underwear that can pull up over the brace so it doesnt get stuck under the brace when she tries to pull them down. Husband practiced hands on with donning  brace and pt able to direct caregiver on how to don/adjust for proper fit. Did need min verbal  cues and min assist to make sure the arrow in the back lines up with the center of her back. Once correct, pt and husband verbalized understanding of how to center brace. Discussed recommendation of toilet aid and where to obtain. Recommend HHOT followup to reinforce precautions and ADL safety and any caregiver education needed once home. Did discuss showering with pt and encouraged her to call her MD before she does a shower to make sure he is ok with her sitting on the tubbench and taking off the brace to shower and then don  again to transfer off the bench. Pt verbalized understanding of need to call and clarify with MD.    OT Diagnosis:    OT Problem List:   OT Treatment Interventions:     OT Goals Acute Rehab OT Goals OT Goal Formulation: With patient Time For Goal Achievement: 08/02/12 Potential to Achieve Goals: Good ADL Goals Pt Will Perform Grooming: with supervision;Standing at sink Pt Will Transfer to Toilet: with supervision;Ambulation;3-in-1;with DME ADL Goal: Toilet Transfer - Progress: Progressing toward goals Pt Will Perform Toileting - Clothing Manipulation: with supervision;Standing Pt Will Perform Toileting - Hygiene: with supervision;with adaptive equipment;Sit to stand from 3-in-1/toilet (toilet aid PRN') Additional ADL Goal #1: Pt will perform log roll technique to transfer to EOB/back in bed with supervison in prep for ADL.  ADL Goal: Additional Goal #1 - Progress: Progressing toward goals  Visit Information  Last OT Received On: 07/28/12 Assistance Needed: +1    Subjective Data  Subjective: I  am going home today Patient Stated Goal: wants to do what she can for herself   Prior Functioning       Cognition  Cognition Arousal/Alertness: Awake/alert Behavior During Therapy: WFL for tasks assessed/performed Overall Cognitive Status: Within Functional Limits for tasks  assessed    Mobility  Bed Mobility Bed Mobility: Rolling Left;Left Sidelying to Sit Rolling Left: 4: Min guard Left Sidelying to Sit: 4: Min guard;HOB flat Details for Bed Mobility Assistance: did much better with log roll technique. Did need min cues to remember to use log roll but once reminded, pt did well with actual roll.  Transfers Transfers: Sit to Stand;Stand to Sit Sit to Stand: 4: Min guard;With upper extremity assist;From bed;From chair/3-in-1 Stand to Sit: 4: Min guard;With upper extremity assist;To chair/3-in-1 Details for Transfer Assistance: min verbal cues to keep back straight and for hand placement    Exercises      Balance     End of Session OT - End of Session Activity Tolerance: Patient tolerated treatment well Patient left: in chair;with call bell/phone within reach;with family/visitor present  GO     Lennox Laity [phone] 07/28/2012, 12:14 PM

## 2012-07-28 NOTE — Discharge Summary (Signed)
DISCHARGE SUMMARY  CHASTIDY RANKER  MR#: [id number]  DOB:1934-03-02  Date of Admission: 07/25/2012 Date of Discharge: 07/28/2012  Attending Physician:Paulmichael Schreck [name]  Patient's WUJ:[id number] Hessie Diener, MD  Consults:   none  Discharge Diagnoses: L4 osteoporotic compression fracture with pain Abnormal low cortisol with ACTH test pending Hypothyroid:, Clinically stable Neuropathy: On therapy Depression: Doing well Breast CA: Clinically stable Hypokalemia: Resolved Hyperlipidemia: On Rx  Constipation: Improved  Seizure disorder, on therapy GERD Neuropathy Mildly elevated fasting blood sugar   Discharge Medications:   Medication List    TAKE these medications       calcitonin (salmon) 200 UNIT/ACT nasal spray  Commonly known as:  MIACALCIN/FORTICAL  Place 1 spray into the nose daily.     DEXILANT 60 MG capsule  Generic drug:  dexlansoprazole  Take 60 mg by mouth 2 (two) times daily.     DULoxetine 60 MG capsule  Commonly known as:  CYMBALTA  Take 1 capsule (60 mg total) by mouth daily.     ergocalciferol 50000 UNITS capsule  Commonly known as:  VITAMIN D2  Take 50,000 Units by mouth once a week. tuesday     ezetimibe-simvastatin 10-80 MG per tablet  Commonly known as:  VYTORIN  Take 1 tablet by mouth at bedtime.     fluticasone 50 MCG/ACT nasal spray  Commonly known as:  FLONASE  Place 2 sprays into the nose daily.     gabapentin 100 MG capsule  Commonly known as:  NEURONTIN  Take 300 mg by mouth See admin instructions. 3 in the morning and if needed 3 at bedtime     GRAPE SEED EXTRACT PO  Take 1 capsule by mouth at bedtime.     hyoscyamine 0.375 MG 12 hr tablet  Commonly known as:  LEVBID  Take 1 tablet by mouth every 12 (twelve) hours.     KLOR-CON M20 20 MEQ tablet  Generic drug:  potassium chloride SA  Take 1 tablet by mouth daily.     levETIRAcetam 500 MG tablet  Commonly known as:  KEPPRA  Take 1 tablet (500 mg total) by mouth 2 (two)  times daily.     levothyroxine 88 MCG tablet  Commonly known as:  SYNTHROID, LEVOTHROID  Take 88 mcg by mouth every morning.     LORazepam 0.5 MG tablet  Commonly known as:  ATIVAN  Take 1 tablet (0.5 mg total) by mouth every 8 (eight) hours as needed. Anxiety     meloxicam 15 MG tablet  Commonly known as:  MOBIC  Take 15 mg by mouth daily as needed for pain. pain     metroNIDAZOLE 1 % gel  Commonly known as:  METROGEL  Apply 1 application topically daily. Applied to face for rosacea.     MICRONIZED COLESTIPOL HCL 1 G tablet  Generic drug:  colestipol  Take 1 tablet by mouth daily.     oxyCODONE-acetaminophen 5-325 MG per tablet  Commonly known as:  PERCOCET  Take 1 tablet by mouth every 4 (four) hours as needed for pain.     oxyCODONE-acetaminophen 5-325 MG per tablet  Commonly known as:  PERCOCET/ROXICET  Take 1 tablet by mouth every 4 (four) hours as needed.     predniSONE 5 MG tablet  Commonly known as:  DELTASONE  Take 1 tablet (5 mg total) by mouth daily with breakfast.  Start taking on:  07/29/2012     SALINE NASAL MIST NA  Place 1 spray into the nose every 12 (twelve) hours.  traMADol 50 MG tablet  Commonly known as:  ULTRAM  Take 50 mg by mouth every 6 (six) hours as needed for pain.     UCERIS 9 MG Tb24  Generic drug:  Budesonide  Take 1 capsule by mouth every 12 (twelve) hours.     vitamin C 1000 MG tablet  Take 1,000 mg by mouth daily.     VITAMIN E PO  Take 400 mg by mouth daily.        Hospital Procedures: Dg Lumbar Spine Complete  07/25/2012   *RADIOLOGY REPORT*  Clinical Data: Fall, back pain  LUMBAR SPINE - COMPLETE 4+ VIEW  Comparison: CT abdomen 02/22/2012  Findings: Lateral projection of the lumbar spine demonstrates a new compression deformity of the superior endplate of the L4 vertebral body compared to CT of 02/22/2012.  There is approximately 10% loss of vertebral body heights centrally at this level.  No retropulsion.  There is mild  anterolisthesis of L4-L5 which is unchanged.  There is a compression deformities at T12 which is unchanged from prior. Multilevel disc osteophytic disease.  IMPRESSION:  1.  Superior endplate compression deformity at L4 is new from 02/22/2012.  No retropulsion. 2.  Multilevel disc osteophytic disease unchanged from prior.   Original Report Authenticated By: Genevive Bi, M.D.   Ct Lumbar Spine Wo Contrast  07/26/2012   *RADIOLOGY REPORT*  Clinical Data: Low back pain after lifting injury last week. Evaluate L4 fracture.  CT LUMBAR SPINE WITHOUT CONTRAST  Technique:  Multidetector CT imaging of the lumbar spine was performed without intravenous contrast administration. Multiplanar CT image reconstructions were also generated.  Comparison: Lumbar spine radiographs 07/25/2012, PET CT 03/14/2012 and abdominal CT 02/22/2012.  Findings: As demonstrated on the recent radiographs, there is a new superior endplate compression deformity at L4 resulting in approximately 10% loss of vertebral body height.  This fracture is associated with sharp cortical margins (best seen on the coronal and sagittal images) and is consistent with an acute or subacute fracture.  There is minimal associated osseous retropulsion.  The posterior elements are intact.  Chronic compression deformities at T12 and L1 appear stable.  No other new fractures are identified.  The alignment is stable with a degenerative anterolisthesis at 4-L5.  There is a small amount of anterior paraspinal hemorrhage.  No epidural hematoma is identified.  Aorto iliac atherosclerosis is noted.  Relatively mild degenerative changes are present throughout the spine.  There are no significant disc space findings at L1-L2 or L2- L3.  L3-L4:  Disc bulging, facet and ligamentous hypertrophy contribute to mild central stenosis.  There is no significant foraminal compromise.  L4-L5:  The patient has undergone previous right laminectomy at this level.  There is disc bulging  with bilateral facet hypertrophy accounting for the anterolisthesis.  Mild foraminal narrowing is present, left greater than right.  L5-S1:  Disc height is maintained.  There is bilateral facet hypertrophy without resulting foraminal compromise.  IMPRESSION:  1.  Acute/subacute mild superior endplate compression fracture at L4.  This demonstrates no pathologic features or significant osseous retropulsion. 2.  Old healed compression deformities at T12 and L1. 3.  Relatively mild multilevel spondylosis as described status post surgery at L4-L5.  There is mild multifactorial spinal stenosis at L3-L4.   Original Report Authenticated By: Carey Bullocks, M.D.   Mm Digital Diagnostic Bilat  07/14/2012   *RADIOLOGY REPORT*  Clinical Data:  History of right lumpectomy for breast cancer 2012, left lumpectomy for breast cancer 1997.  Asymptomatic.  DIGITAL DIAGNOSTIC BILATERAL MAMMOGRAM WITH CAD  Mammographic images were processed with CAD.  Comparison: Prior exams  Findings:  ACR Breast Density Category 2: There is a scattered fibroglandular pattern.  Bilateral lumpectomy changes are noted.  No suspicious mass, or architectural distortion, or calcification is seen.  Mammographic images were processed with CAD.  IMPRESSION: No evidence for malignancy in either breast.  Expected lumpectomy changes are noted bilaterally.  RECOMMENDATION: Bilateral diagnostic mammography is recommended in 1 year.  I have discussed the findings and recommendations with the patient. Results were also provided in writing at the conclusion of the visit.  If applicable, a reminder letter will be sent to the patient regarding her next appointment.  BI-RADS CATEGORY 2:  Benign finding(s).   Original Report Authenticated By: Christiana Pellant, M.D.    History of Present Illness: Back pain  Hospital Course: This is a 78 year old white female presents emergency room with pain in the mid lower back. There is associated with significant spasm and  inability to ambulate. She had an injury 2 days prior to admission that led to this. Evaluation has shown that she has a compression fracture of L4. CT has confirmed that this is approximately 10% superior compression fracture of the endplate. No foraminal or cord impingement is noted. Patient has improved significantly since admission. She's now been fitted with a brace that has helped her ambulatory status. Miacalcin has been used with good pain relief. A cortisol was checked in the late morning and found to be very low at 0.6. His is an unexpected finding and an ACTH test was done this morning is pending. We will cover her with prednisone for possible adrenal insufficiency until the test results are back. She had some mild hypokalemia that has been improved. Her blood pressure is done well. Her neuropathy is been under control. She has some constipation has been resolved. There is no evidence that this is a pathologic fracture from her breast cancer. Patient's diet has been advanced and she's doing well with this. We will get home physical therapy help her for a few days. We'll be discussing treatments for osteoporosis at her return visit. She is to remain in vitamin D supplementation. The Miacalcin is used for pain relief in the short term. She is to wear her back brace while up. Day of Discharge Exam BP 146/82  Pulse 81  Temp(Src) 98.7 F (37.1 C) (Oral)  Resp 14  Ht 5' 6.5" (1.689 m)  Wt 75.751 kg (167 lb)  BMI 26.55 kg/m2  SpO2 96%  Physical Exam: General appearance: Somewhat round face with temporal balding. Sclera anicteric gaze is conjugate. Oral mucous members are moist. She is in no distress.  Neck is supple without bruits. Resp: Clear with no wheezes rales or rhonchi. No excess her muscles are in use. Cardio: Regular was a soft systolic murmur GI: soft, non-tender; bowel sounds normal; no masses,  no organomegaly Extremities: no clubbing, cyanosis or edema distal pulses are fairly  intact Neuro: Patient's awake alert and mentating well. Speech is clear. No tremor is present. She's in good spirits.  Discharge Labs:  Recent Labs  07/25/12 1449 07/26/12 0425  NA 137 140  K 3.1* 4.1  CL 107 104  CO2  --  30  GLUCOSE 92 119*  BUN 17 13  CREATININE 0.90 0.86  CALCIUM  --  9.6     Recent Labs  07/25/12 1449 07/26/12 0425  WBC  --  5.3  HGB 12.2 12.1  HCT 36.0 38.4  MCV  --  96.2  PLT  --  183        Discharge instructions:      Future Appointments Provider Department Dept Phone   08/02/2012 10:45 AM Barbaraann Share, MD [facility] [phone]   12/05/2012 2:30 PM Levert Feinstein, MD [facility] [phone]   01/04/2013 1:30 PM [facility] MEDICAL ONCOLOGY [phone]   01/04/2013 2:00 PM Lowella Dell, MD [facility] MEDICAL ONCOLOGY [phone]      Disposition: To home  Follow-up Appts: Follow-up with Dr. [name] at [facility] in 1-2 weeks  Call for appointment.  Condition on Discharge: improved  Tests Needing Follow-up: ACTH test results  Signed: Julian Hy 07/28/2012, 8:24 AM

## 2012-07-28 NOTE — Care Management Note (Signed)
    Page 1 of 1   07/28/2012     3:13:32 PM   CARE MANAGEMENT NOTE 07/28/2012  Patient:  [name], [name]   Account Number:  [id number]  Date Initiated:  07/27/2012  Documentation initiated by:  [facility]  Subjective/Objective Assessment:   78 year old female admitted with back pain.     Action/Plan:   Will d/c home with [facility] services.   Anticipated DC Date:  07/28/2012   Anticipated DC Plan:  HOME W HOME HEALTH SERVICES      DC Planning Services  CM consult      Choice offered to / List presented to:  C-1 Patient        HH arranged  HH-2 PT      [facility] agency  [facility]   Status of service:  Completed, signed off Medicare Important Message given?  NA - LOS <3 / Initial given by admissions (If response is "NO", the following Medicare IM given date fields will be blank) Date Medicare IM given:   Date Additional Medicare IM given:    Discharge Disposition:  HOME W HOME HEALTH SERVICES  Per UR Regulation:  Reviewed for med. necessity/level of care/duration of stay  If discussed at Long Length of Stay Meetings, dates discussed:    Comments:

## 2012-07-28 NOTE — Progress Notes (Signed)
Physical Therapy Treatment Patient Details Name: JALESIA LOUDENSLAGER MRN: [id number] DOB: Mar 05, 1934 Today's Date: 07/28/2012 Time: 7829-5621 PT Time Calculation (min): 24 min  PT Assessment / Plan / Recommendation Comments on Treatment Session  Pt progressing well from L4 Comp Fx with TLSO brace for OOB activity esp walking.  Pt given handout on back percautions and was able to recall 3/3.  Amb pt in hallway then performed steps.  Pt plans to D/C to home today.      Follow Up Recommendations  Home health PT;Supervision for mobility/OOB     Does the patient have the potential to tolerate intense rehabilitation     Barriers to Discharge        Equipment Recommendations  None recommended by PT    Recommendations for Other Services    Frequency Min 4X/week   Plan Discharge plan remains appropriate;Frequency remains appropriate    Precautions / Restrictions Precautions Precautions: Back Precaution Comments: L4 compression fx. following/encouraging back precautions. pt also with history of seizures Required Braces or Orthoses: Spinal Brace Spinal Brace: Thoracolumbosacral orthotic;Other (comment) Spinal Brace Comments: for ambulation Restrictions Weight Bearing Restrictions: No   Pertinent Vitals/Pain C/o "tenderness" No pain    Mobility  Bed Mobility Bed Mobility: Not assessed Rolling Left: 4: Min guard Left Sidelying to Sit: 4: Min guard;HOB flat Details for Bed Mobility Assistance: Pt OOB in recliner dressed and ready to go home Transfers Transfers: Sit to Stand;Stand to Sit Sit to Stand: 4: Min guard;With upper extremity assist;From chair/3-in-1 Stand to Sit: 4: Min guard;With upper extremity assist;To chair/3-in-1 Details for Transfer Assistance: min verbal cues to keep back straight and for hand placement plus increased time Ambulation/Gait Ambulation/Gait Assistance: 5: Supervision;4: Min guard Ambulation Distance (Feet): 220 Feet Assistive device: Rolling  walker Ambulation/Gait Assistance Details: <25% VC's to avoid twisting and bending as pt was looking into other rooms and talking to other pt's.   Gait Pattern: Step-through pattern;Decreased stride length Gait velocity: decreased Stairs: Yes Stairs Assistance: 5: Supervision;4: Min guard Stairs Assistance Details (indicate cue type and reason): good safety tech and remembered  proper sequencing.  Spouse present as well. Stair Management Technique: Two rails Number of Stairs: 2     PT Goals                                                     progressing    Visit Information  Last PT Received On: 07/28/12 Assistance Needed: +1    Subjective Data  Subjective: I'm going home today   Cognition  Cognition Arousal/Alertness: Awake/alert Behavior During Therapy: WFL for tasks assessed/performed Overall Cognitive Status: Within Functional Limits for tasks assessed    Balance     End of Session PT - End of Session Equipment Utilized During Treatment: Gait belt;Back brace Activity Tolerance: Patient tolerated treatment well;Patient limited by pain Patient left: in chair;with call bell/phone within reach;with family/visitor present   Felecia Shelling  PTA [facility]  Acute  Rehab Pager      [phone]

## 2012-07-29 DIAGNOSIS — IMO0001 Reserved for inherently not codable concepts without codable children: Secondary | ICD-10-CM | POA: Diagnosis not present

## 2012-07-29 DIAGNOSIS — F329 Major depressive disorder, single episode, unspecified: Secondary | ICD-10-CM | POA: Diagnosis not present

## 2012-07-29 DIAGNOSIS — K589 Irritable bowel syndrome without diarrhea: Secondary | ICD-10-CM | POA: Diagnosis not present

## 2012-07-29 DIAGNOSIS — M8448XD Pathological fracture, other site, subsequent encounter for fracture with routine healing: Secondary | ICD-10-CM | POA: Diagnosis not present

## 2012-07-29 DIAGNOSIS — G40909 Epilepsy, unspecified, not intractable, without status epilepticus: Secondary | ICD-10-CM | POA: Diagnosis not present

## 2012-07-29 DIAGNOSIS — G609 Hereditary and idiopathic neuropathy, unspecified: Secondary | ICD-10-CM | POA: Diagnosis not present

## 2012-08-01 ENCOUNTER — Telehealth: Payer: Self-pay | Admitting: Radiation Oncology

## 2012-08-01 DIAGNOSIS — M8448XD Pathological fracture, other site, subsequent encounter for fracture with routine healing: Secondary | ICD-10-CM | POA: Diagnosis not present

## 2012-08-01 DIAGNOSIS — G609 Hereditary and idiopathic neuropathy, unspecified: Secondary | ICD-10-CM | POA: Diagnosis not present

## 2012-08-01 DIAGNOSIS — G40909 Epilepsy, unspecified, not intractable, without status epilepticus: Secondary | ICD-10-CM | POA: Diagnosis not present

## 2012-08-01 DIAGNOSIS — K589 Irritable bowel syndrome without diarrhea: Secondary | ICD-10-CM | POA: Diagnosis not present

## 2012-08-01 DIAGNOSIS — IMO0001 Reserved for inherently not codable concepts without codable children: Secondary | ICD-10-CM | POA: Diagnosis not present

## 2012-08-01 LAB — ACTH STIMULATION, 3 TIME POINTS: Cortisol, Base: 0.6 ug/dL

## 2012-08-01 NOTE — Telephone Encounter (Signed)
Faxed EOT 01/12/11, FUP 02/09/11 to attorneys Gabrielle Dare, [phone], per Dimas Chyle and RJM.  Received confirmation.

## 2012-08-02 ENCOUNTER — Other Ambulatory Visit: Payer: Self-pay | Admitting: Endocrinology

## 2012-08-02 ENCOUNTER — Ambulatory Visit: Payer: Medicare Other | Admitting: Pulmonary Disease

## 2012-08-02 DIAGNOSIS — E2749 Other adrenocortical insufficiency: Secondary | ICD-10-CM

## 2012-08-03 DIAGNOSIS — G40909 Epilepsy, unspecified, not intractable, without status epilepticus: Secondary | ICD-10-CM | POA: Diagnosis not present

## 2012-08-03 DIAGNOSIS — M8448XD Pathological fracture, other site, subsequent encounter for fracture with routine healing: Secondary | ICD-10-CM | POA: Diagnosis not present

## 2012-08-03 DIAGNOSIS — IMO0001 Reserved for inherently not codable concepts without codable children: Secondary | ICD-10-CM | POA: Diagnosis not present

## 2012-08-03 DIAGNOSIS — G609 Hereditary and idiopathic neuropathy, unspecified: Secondary | ICD-10-CM | POA: Diagnosis not present

## 2012-08-03 DIAGNOSIS — K589 Irritable bowel syndrome without diarrhea: Secondary | ICD-10-CM | POA: Diagnosis not present

## 2012-08-06 DIAGNOSIS — IMO0001 Reserved for inherently not codable concepts without codable children: Secondary | ICD-10-CM | POA: Diagnosis not present

## 2012-08-06 DIAGNOSIS — M8448XD Pathological fracture, other site, subsequent encounter for fracture with routine healing: Secondary | ICD-10-CM | POA: Diagnosis not present

## 2012-08-06 DIAGNOSIS — G609 Hereditary and idiopathic neuropathy, unspecified: Secondary | ICD-10-CM | POA: Diagnosis not present

## 2012-08-06 DIAGNOSIS — G40909 Epilepsy, unspecified, not intractable, without status epilepticus: Secondary | ICD-10-CM | POA: Diagnosis not present

## 2012-08-06 DIAGNOSIS — K589 Irritable bowel syndrome without diarrhea: Secondary | ICD-10-CM | POA: Diagnosis not present

## 2012-08-07 ENCOUNTER — Other Ambulatory Visit: Payer: Self-pay | Admitting: Oncology

## 2012-08-07 ENCOUNTER — Ambulatory Visit
Admission: RE | Admit: 2012-08-07 | Discharge: 2012-08-07 | Disposition: A | Discharge status: Home / Self Care | Payer: Medicare Other | Source: Ambulatory Visit | Attending: Endocrinology | Admitting: Endocrinology

## 2012-08-07 DIAGNOSIS — E2749 Other adrenocortical insufficiency: Secondary | ICD-10-CM

## 2012-08-07 DIAGNOSIS — K7689 Other specified diseases of liver: Secondary | ICD-10-CM | POA: Diagnosis not present

## 2012-08-07 MED ORDER — IOHEXOL 300 MG/ML  SOLN
100.0000 mL | Freq: Once | INTRAMUSCULAR | Status: AC | PRN
  Administered 2012-08-07: 100 mL via INTRAVENOUS

## 2012-08-07 NOTE — Telephone Encounter (Signed)
Refill approved X 1 per Zollie Scale, PA with future per PCP since patient is no longer in active treatment.

## 2012-08-08 DIAGNOSIS — G40909 Epilepsy, unspecified, not intractable, without status epilepticus: Secondary | ICD-10-CM | POA: Diagnosis not present

## 2012-08-08 DIAGNOSIS — IMO0001 Reserved for inherently not codable concepts without codable children: Secondary | ICD-10-CM | POA: Diagnosis not present

## 2012-08-08 DIAGNOSIS — E2749 Other adrenocortical insufficiency: Secondary | ICD-10-CM | POA: Diagnosis not present

## 2012-08-08 DIAGNOSIS — E041 Nontoxic single thyroid nodule: Secondary | ICD-10-CM | POA: Diagnosis not present

## 2012-08-08 DIAGNOSIS — K589 Irritable bowel syndrome without diarrhea: Secondary | ICD-10-CM | POA: Diagnosis not present

## 2012-08-08 DIAGNOSIS — E559 Vitamin D deficiency, unspecified: Secondary | ICD-10-CM | POA: Diagnosis not present

## 2012-08-08 DIAGNOSIS — D649 Anemia, unspecified: Secondary | ICD-10-CM | POA: Diagnosis not present

## 2012-08-08 DIAGNOSIS — M8448XD Pathological fracture, other site, subsequent encounter for fracture with routine healing: Secondary | ICD-10-CM | POA: Diagnosis not present

## 2012-08-08 DIAGNOSIS — E876 Hypokalemia: Secondary | ICD-10-CM | POA: Diagnosis not present

## 2012-08-08 DIAGNOSIS — G609 Hereditary and idiopathic neuropathy, unspecified: Secondary | ICD-10-CM | POA: Diagnosis not present

## 2012-08-14 DIAGNOSIS — G40909 Epilepsy, unspecified, not intractable, without status epilepticus: Secondary | ICD-10-CM | POA: Diagnosis not present

## 2012-08-14 DIAGNOSIS — M8448XD Pathological fracture, other site, subsequent encounter for fracture with routine healing: Secondary | ICD-10-CM | POA: Diagnosis not present

## 2012-08-14 DIAGNOSIS — IMO0001 Reserved for inherently not codable concepts without codable children: Secondary | ICD-10-CM | POA: Diagnosis not present

## 2012-08-14 DIAGNOSIS — K589 Irritable bowel syndrome without diarrhea: Secondary | ICD-10-CM | POA: Diagnosis not present

## 2012-08-14 DIAGNOSIS — G609 Hereditary and idiopathic neuropathy, unspecified: Secondary | ICD-10-CM | POA: Diagnosis not present

## 2012-08-15 DIAGNOSIS — E876 Hypokalemia: Secondary | ICD-10-CM | POA: Diagnosis not present

## 2012-08-16 DIAGNOSIS — G609 Hereditary and idiopathic neuropathy, unspecified: Secondary | ICD-10-CM | POA: Diagnosis not present

## 2012-08-16 DIAGNOSIS — M8448XD Pathological fracture, other site, subsequent encounter for fracture with routine healing: Secondary | ICD-10-CM | POA: Diagnosis not present

## 2012-08-16 DIAGNOSIS — K589 Irritable bowel syndrome without diarrhea: Secondary | ICD-10-CM | POA: Diagnosis not present

## 2012-08-16 DIAGNOSIS — IMO0001 Reserved for inherently not codable concepts without codable children: Secondary | ICD-10-CM | POA: Diagnosis not present

## 2012-08-16 DIAGNOSIS — G40909 Epilepsy, unspecified, not intractable, without status epilepticus: Secondary | ICD-10-CM | POA: Diagnosis not present

## 2012-08-21 DIAGNOSIS — K589 Irritable bowel syndrome without diarrhea: Secondary | ICD-10-CM | POA: Diagnosis not present

## 2012-08-21 DIAGNOSIS — M8448XD Pathological fracture, other site, subsequent encounter for fracture with routine healing: Secondary | ICD-10-CM | POA: Diagnosis not present

## 2012-08-21 DIAGNOSIS — G609 Hereditary and idiopathic neuropathy, unspecified: Secondary | ICD-10-CM | POA: Diagnosis not present

## 2012-08-21 DIAGNOSIS — G40909 Epilepsy, unspecified, not intractable, without status epilepticus: Secondary | ICD-10-CM | POA: Diagnosis not present

## 2012-08-21 DIAGNOSIS — IMO0001 Reserved for inherently not codable concepts without codable children: Secondary | ICD-10-CM | POA: Diagnosis not present

## 2012-08-22 DIAGNOSIS — H532 Diplopia: Secondary | ICD-10-CM | POA: Diagnosis not present

## 2012-08-22 DIAGNOSIS — Z961 Presence of intraocular lens: Secondary | ICD-10-CM | POA: Diagnosis not present

## 2012-08-23 DIAGNOSIS — M8448XD Pathological fracture, other site, subsequent encounter for fracture with routine healing: Secondary | ICD-10-CM | POA: Diagnosis not present

## 2012-08-23 DIAGNOSIS — G40909 Epilepsy, unspecified, not intractable, without status epilepticus: Secondary | ICD-10-CM | POA: Diagnosis not present

## 2012-08-23 DIAGNOSIS — IMO0001 Reserved for inherently not codable concepts without codable children: Secondary | ICD-10-CM | POA: Diagnosis not present

## 2012-08-23 DIAGNOSIS — G609 Hereditary and idiopathic neuropathy, unspecified: Secondary | ICD-10-CM | POA: Diagnosis not present

## 2012-08-23 DIAGNOSIS — K589 Irritable bowel syndrome without diarrhea: Secondary | ICD-10-CM | POA: Diagnosis not present

## 2012-08-26 DIAGNOSIS — E2749 Other adrenocortical insufficiency: Secondary | ICD-10-CM | POA: Diagnosis not present

## 2012-08-26 DIAGNOSIS — Z6827 Body mass index (BMI) 27.0-27.9, adult: Secondary | ICD-10-CM | POA: Diagnosis not present

## 2012-08-26 DIAGNOSIS — E041 Nontoxic single thyroid nodule: Secondary | ICD-10-CM | POA: Diagnosis not present

## 2012-08-26 DIAGNOSIS — S32009A Unspecified fracture of unspecified lumbar vertebra, initial encounter for closed fracture: Secondary | ICD-10-CM | POA: Diagnosis not present

## 2012-08-26 DIAGNOSIS — D649 Anemia, unspecified: Secondary | ICD-10-CM | POA: Diagnosis not present

## 2012-08-26 DIAGNOSIS — R569 Unspecified convulsions: Secondary | ICD-10-CM | POA: Diagnosis not present

## 2012-08-28 DIAGNOSIS — IMO0001 Reserved for inherently not codable concepts without codable children: Secondary | ICD-10-CM | POA: Diagnosis not present

## 2012-08-28 DIAGNOSIS — M8448XD Pathological fracture, other site, subsequent encounter for fracture with routine healing: Secondary | ICD-10-CM | POA: Diagnosis not present

## 2012-08-28 DIAGNOSIS — G609 Hereditary and idiopathic neuropathy, unspecified: Secondary | ICD-10-CM | POA: Diagnosis not present

## 2012-08-28 DIAGNOSIS — K589 Irritable bowel syndrome without diarrhea: Secondary | ICD-10-CM | POA: Diagnosis not present

## 2012-08-28 DIAGNOSIS — G40909 Epilepsy, unspecified, not intractable, without status epilepticus: Secondary | ICD-10-CM | POA: Diagnosis not present

## 2012-08-31 NOTE — Telephone Encounter (Signed)
Patient called in to discuss sleep study results.  Discussed findings, recommendations and follow up care.  Patient understood well and all questions were answered.  She will meet to discuss with Dr. Terrace Arabia at the end of July.

## 2012-09-06 DIAGNOSIS — K589 Irritable bowel syndrome without diarrhea: Secondary | ICD-10-CM | POA: Diagnosis not present

## 2012-09-06 DIAGNOSIS — G40909 Epilepsy, unspecified, not intractable, without status epilepticus: Secondary | ICD-10-CM | POA: Diagnosis not present

## 2012-09-06 DIAGNOSIS — G609 Hereditary and idiopathic neuropathy, unspecified: Secondary | ICD-10-CM | POA: Diagnosis not present

## 2012-09-06 DIAGNOSIS — M8448XD Pathological fracture, other site, subsequent encounter for fracture with routine healing: Secondary | ICD-10-CM | POA: Diagnosis not present

## 2012-09-06 DIAGNOSIS — IMO0001 Reserved for inherently not codable concepts without codable children: Secondary | ICD-10-CM | POA: Diagnosis not present

## 2012-09-11 DIAGNOSIS — G609 Hereditary and idiopathic neuropathy, unspecified: Secondary | ICD-10-CM | POA: Diagnosis not present

## 2012-09-11 DIAGNOSIS — IMO0001 Reserved for inherently not codable concepts without codable children: Secondary | ICD-10-CM | POA: Diagnosis not present

## 2012-09-11 DIAGNOSIS — K589 Irritable bowel syndrome without diarrhea: Secondary | ICD-10-CM | POA: Diagnosis not present

## 2012-09-11 DIAGNOSIS — M8448XD Pathological fracture, other site, subsequent encounter for fracture with routine healing: Secondary | ICD-10-CM | POA: Diagnosis not present

## 2012-09-11 DIAGNOSIS — G40909 Epilepsy, unspecified, not intractable, without status epilepticus: Secondary | ICD-10-CM | POA: Diagnosis not present

## 2012-09-27 DIAGNOSIS — Z1331 Encounter for screening for depression: Secondary | ICD-10-CM | POA: Diagnosis not present

## 2012-09-27 DIAGNOSIS — E2749 Other adrenocortical insufficiency: Secondary | ICD-10-CM | POA: Diagnosis not present

## 2012-09-27 DIAGNOSIS — D649 Anemia, unspecified: Secondary | ICD-10-CM | POA: Diagnosis not present

## 2012-09-27 DIAGNOSIS — E041 Nontoxic single thyroid nodule: Secondary | ICD-10-CM | POA: Diagnosis not present

## 2012-09-27 DIAGNOSIS — S32009A Unspecified fracture of unspecified lumbar vertebra, initial encounter for closed fracture: Secondary | ICD-10-CM | POA: Diagnosis not present

## 2012-09-27 DIAGNOSIS — I10 Essential (primary) hypertension: Secondary | ICD-10-CM | POA: Diagnosis not present

## 2012-09-27 DIAGNOSIS — C50919 Malignant neoplasm of unspecified site of unspecified female breast: Secondary | ICD-10-CM | POA: Diagnosis not present

## 2012-09-27 DIAGNOSIS — E559 Vitamin D deficiency, unspecified: Secondary | ICD-10-CM | POA: Diagnosis not present

## 2012-09-27 DIAGNOSIS — R569 Unspecified convulsions: Secondary | ICD-10-CM | POA: Diagnosis not present

## 2012-10-27 ENCOUNTER — Encounter: Payer: Self-pay | Admitting: Radiation Oncology

## 2012-10-30 ENCOUNTER — Emergency Department (HOSPITAL_COMMUNITY): Payer: Medicare Other

## 2012-10-30 ENCOUNTER — Inpatient Hospital Stay (HOSPITAL_COMMUNITY)
Admission: EM | Admit: 2012-10-30 | Discharge: 2012-11-03 | DRG: 516 | Disposition: A | Discharge status: Home Health | Payer: Medicare Other | Attending: Endocrinology | Admitting: Endocrinology

## 2012-10-30 ENCOUNTER — Encounter (HOSPITAL_COMMUNITY): Payer: Self-pay | Admitting: Emergency Medicine

## 2012-10-30 DIAGNOSIS — M8448XA Pathological fracture, other site, initial encounter for fracture: Secondary | ICD-10-CM | POA: Diagnosis not present

## 2012-10-30 DIAGNOSIS — R2681 Unsteadiness on feet: Secondary | ICD-10-CM

## 2012-10-30 DIAGNOSIS — Z9181 History of falling: Secondary | ICD-10-CM

## 2012-10-30 DIAGNOSIS — IMO0001 Reserved for inherently not codable concepts without codable children: Secondary | ICD-10-CM | POA: Diagnosis present

## 2012-10-30 DIAGNOSIS — M810 Age-related osteoporosis without current pathological fracture: Secondary | ICD-10-CM | POA: Diagnosis not present

## 2012-10-30 DIAGNOSIS — G620 Drug-induced polyneuropathy: Secondary | ICD-10-CM | POA: Diagnosis present

## 2012-10-30 DIAGNOSIS — R209 Unspecified disturbances of skin sensation: Secondary | ICD-10-CM | POA: Diagnosis present

## 2012-10-30 DIAGNOSIS — M4850XA Collapsed vertebra, not elsewhere classified, site unspecified, initial encounter for fracture: Secondary | ICD-10-CM

## 2012-10-30 DIAGNOSIS — R296 Repeated falls: Secondary | ICD-10-CM

## 2012-10-30 DIAGNOSIS — Z9221 Personal history of antineoplastic chemotherapy: Secondary | ICD-10-CM

## 2012-10-30 DIAGNOSIS — S32009A Unspecified fracture of unspecified lumbar vertebra, initial encounter for closed fracture: Principal | ICD-10-CM | POA: Diagnosis present

## 2012-10-30 DIAGNOSIS — R404 Transient alteration of awareness: Secondary | ICD-10-CM | POA: Diagnosis not present

## 2012-10-30 DIAGNOSIS — E2749 Other adrenocortical insufficiency: Secondary | ICD-10-CM | POA: Diagnosis not present

## 2012-10-30 DIAGNOSIS — R230 Cyanosis: Secondary | ICD-10-CM | POA: Diagnosis not present

## 2012-10-30 DIAGNOSIS — M542 Cervicalgia: Secondary | ICD-10-CM | POA: Diagnosis not present

## 2012-10-30 DIAGNOSIS — Z853 Personal history of malignant neoplasm of breast: Secondary | ICD-10-CM | POA: Diagnosis not present

## 2012-10-30 DIAGNOSIS — R269 Unspecified abnormalities of gait and mobility: Secondary | ICD-10-CM | POA: Diagnosis present

## 2012-10-30 DIAGNOSIS — M549 Dorsalgia, unspecified: Secondary | ICD-10-CM | POA: Diagnosis not present

## 2012-10-30 DIAGNOSIS — IMO0002 Reserved for concepts with insufficient information to code with codable children: Secondary | ICD-10-CM | POA: Diagnosis not present

## 2012-10-30 DIAGNOSIS — Z87891 Personal history of nicotine dependence: Secondary | ICD-10-CM

## 2012-10-30 DIAGNOSIS — R279 Unspecified lack of coordination: Secondary | ICD-10-CM | POA: Diagnosis not present

## 2012-10-30 DIAGNOSIS — K589 Irritable bowel syndrome without diarrhea: Secondary | ICD-10-CM | POA: Diagnosis present

## 2012-10-30 DIAGNOSIS — E2740 Unspecified adrenocortical insufficiency: Secondary | ICD-10-CM | POA: Diagnosis present

## 2012-10-30 DIAGNOSIS — G8929 Other chronic pain: Secondary | ICD-10-CM | POA: Diagnosis present

## 2012-10-30 DIAGNOSIS — T451X5A Adverse effect of antineoplastic and immunosuppressive drugs, initial encounter: Secondary | ICD-10-CM | POA: Diagnosis present

## 2012-10-30 DIAGNOSIS — M545 Low back pain, unspecified: Secondary | ICD-10-CM | POA: Diagnosis not present

## 2012-10-30 DIAGNOSIS — S0993XA Unspecified injury of face, initial encounter: Secondary | ICD-10-CM | POA: Diagnosis not present

## 2012-10-30 DIAGNOSIS — G9009 Other idiopathic peripheral autonomic neuropathy: Secondary | ICD-10-CM | POA: Diagnosis not present

## 2012-10-30 DIAGNOSIS — S32020A Wedge compression fracture of second lumbar vertebra, initial encounter for closed fracture: Secondary | ICD-10-CM

## 2012-10-30 DIAGNOSIS — Z923 Personal history of irradiation: Secondary | ICD-10-CM | POA: Diagnosis not present

## 2012-10-30 DIAGNOSIS — W010XXA Fall on same level from slipping, tripping and stumbling without subsequent striking against object, initial encounter: Secondary | ICD-10-CM | POA: Diagnosis present

## 2012-10-30 DIAGNOSIS — G629 Polyneuropathy, unspecified: Secondary | ICD-10-CM

## 2012-10-30 DIAGNOSIS — Y92009 Unspecified place in unspecified non-institutional (private) residence as the place of occurrence of the external cause: Secondary | ICD-10-CM

## 2012-10-30 HISTORY — DX: Collapsed vertebra, not elsewhere classified, site unspecified, initial encounter for fracture: M48.50XA

## 2012-10-30 LAB — BASIC METABOLIC PANEL
CO2: 31 mEq/L (ref 19–32)
Calcium: 9.6 mg/dL (ref 8.4–10.5)
Chloride: 101 mEq/L (ref 96–112)
Glucose, Bld: 121 mg/dL — ABNORMAL HIGH (ref 70–99)
Sodium: 141 mEq/L (ref 135–145)

## 2012-10-30 LAB — CBC WITH DIFFERENTIAL/PLATELET
Basophils Absolute: 0 10*3/uL (ref 0.0–0.1)
Eosinophils Relative: 0 % (ref 0–5)
HCT: 43.5 % (ref 36.0–46.0)
Lymphocytes Relative: 20 % (ref 12–46)
Lymphs Abs: 1.9 10*3/uL (ref 0.7–4.0)
MCV: 95.8 fL (ref 78.0–100.0)
Monocytes Absolute: 0.7 10*3/uL (ref 0.1–1.0)
Neutro Abs: 7 10*3/uL (ref 1.7–7.7)
Platelets: 232 10*3/uL (ref 150–400)
RBC: 4.54 MIL/uL (ref 3.87–5.11)
RDW: 13.2 % (ref 11.5–15.5)
WBC: 9.7 10*3/uL (ref 4.0–10.5)

## 2012-10-30 MED ORDER — HYDROMORPHONE HCL PF 1 MG/ML IJ SOLN
1.0000 mg | Freq: Once | INTRAMUSCULAR | Status: AC
  Administered 2012-10-30: 1 mg via INTRAVENOUS
  Filled 2012-10-30: qty 1

## 2012-10-30 MED ORDER — BUDESONIDE 9 MG PO TB24: 1.0000 | ORAL_TABLET | Freq: Two times a day (BID) | ORAL | Status: DC

## 2012-10-30 MED ORDER — LORAZEPAM 0.5 MG PO TABS: 0.5000 mg | ORAL_TABLET | Freq: Three times a day (TID) | ORAL | Status: DC | PRN

## 2012-10-30 MED ORDER — LEVOTHYROXINE SODIUM 88 MCG PO TABS
88.0000 ug | ORAL_TABLET | Freq: Every morning | ORAL | Status: DC
  Administered 2012-10-31 – 2012-11-03 (×4): 88 ug via ORAL
  Filled 2012-10-30 (×4): qty 1

## 2012-10-30 MED ORDER — METRONIDAZOLE 1 % EX GEL: 1.0000 "application " | Freq: Every day | CUTANEOUS | Status: DC

## 2012-10-30 MED ORDER — METRONIDAZOLE 0.75 % EX GEL
Freq: Every day | CUTANEOUS | Status: DC
  Administered 2012-10-31 – 2012-11-02 (×3): via TOPICAL
  Administered 2012-11-03: 1 via TOPICAL
  Filled 2012-10-30: qty 45

## 2012-10-30 MED ORDER — ALUM & MAG HYDROXIDE-SIMETH 200-200-20 MG/5ML PO SUSP: 30.0000 mL | Freq: Four times a day (QID) | ORAL | Status: DC | PRN

## 2012-10-30 MED ORDER — EZETIMIBE-SIMVASTATIN 10-80 MG PO TABS
1.0000 | ORAL_TABLET | Freq: Every day | ORAL | Status: DC
  Administered 2012-10-30 – 2012-11-02 (×4): 1 via ORAL
  Filled 2012-10-30 (×6): qty 1

## 2012-10-30 MED ORDER — CALCITONIN (SALMON) 200 UNIT/ACT NA SOLN
1.0000 | Freq: Every day | NASAL | Status: DC
  Administered 2012-10-31 – 2012-11-03 (×4): 1 via NASAL
  Filled 2012-10-30: qty 3.7

## 2012-10-30 MED ORDER — OXYCODONE-ACETAMINOPHEN 5-325 MG PO TABS
1.0000 | ORAL_TABLET | ORAL | Status: DC | PRN
  Administered 2012-10-30 – 2012-11-03 (×12): 1 via ORAL
  Filled 2012-10-30 (×12): qty 1

## 2012-10-30 MED ORDER — LEVETIRACETAM 500 MG PO TABS
500.0000 mg | ORAL_TABLET | Freq: Two times a day (BID) | ORAL | Status: DC
  Administered 2012-10-30 – 2012-11-03 (×8): 500 mg via ORAL
  Filled 2012-10-30 (×9): qty 1

## 2012-10-30 MED ORDER — ONDANSETRON HCL 4 MG/2ML IJ SOLN
4.0000 mg | Freq: Once | INTRAMUSCULAR | Status: AC
  Administered 2012-10-30: 4 mg via INTRAVENOUS
  Filled 2012-10-30: qty 2

## 2012-10-30 MED ORDER — ACETAMINOPHEN 650 MG RE SUPP: 650.0000 mg | Freq: Four times a day (QID) | RECTAL | Status: DC | PRN

## 2012-10-30 MED ORDER — SODIUM CHLORIDE 0.9 % IV SOLN
INTRAVENOUS | Status: DC
  Administered 2012-10-30: 20:00:00 via INTRAVENOUS

## 2012-10-30 MED ORDER — DOCUSATE SODIUM 100 MG PO CAPS
100.0000 mg | ORAL_CAPSULE | Freq: Two times a day (BID) | ORAL | Status: DC
  Administered 2012-10-30 – 2012-11-03 (×8): 100 mg via ORAL

## 2012-10-30 MED ORDER — FENTANYL CITRATE 0.05 MG/ML IJ SOLN
50.0000 ug | Freq: Once | INTRAMUSCULAR | Status: AC
  Administered 2012-10-30: 50 ug via INTRAVENOUS
  Filled 2012-10-30: qty 2

## 2012-10-30 MED ORDER — FENTANYL 25 MCG/HR TD PT72
25.0000 ug | MEDICATED_PATCH | TRANSDERMAL | Status: DC
  Administered 2012-10-30: 25 ug via TRANSDERMAL

## 2012-10-30 MED ORDER — DULOXETINE HCL 60 MG PO CPEP
60.0000 mg | ORAL_CAPSULE | Freq: Every day | ORAL | Status: DC
  Administered 2012-10-31 – 2012-11-03 (×4): 60 mg via ORAL
  Filled 2012-10-30 (×4): qty 1

## 2012-10-30 MED ORDER — PREDNISONE 5 MG PO TABS
5.0000 mg | ORAL_TABLET | Freq: Every day | ORAL | Status: DC
  Administered 2012-10-31 – 2012-11-03 (×4): 5 mg via ORAL
  Filled 2012-10-30 (×5): qty 1

## 2012-10-30 MED ORDER — HYDROMORPHONE HCL PF 1 MG/ML IJ SOLN
1.0000 mg | INTRAMUSCULAR | Status: DC | PRN
  Administered 2012-10-31 (×2): 1 mg via INTRAVENOUS
  Filled 2012-10-30 (×2): qty 1

## 2012-10-30 MED ORDER — COLESTIPOL HCL 1 G PO TABS
1.0000 g | ORAL_TABLET | Freq: Every day | ORAL | Status: DC
  Administered 2012-10-31 – 2012-11-03 (×4): 1 g via ORAL
  Filled 2012-10-30 (×4): qty 1

## 2012-10-30 MED ORDER — PANTOPRAZOLE SODIUM 40 MG PO TBEC
40.0000 mg | DELAYED_RELEASE_TABLET | Freq: Every day | ORAL | Status: DC
  Administered 2012-10-31 – 2012-11-03 (×4): 40 mg via ORAL
  Filled 2012-10-30 (×4): qty 1

## 2012-10-30 MED ORDER — POLYETHYLENE GLYCOL 3350 17 G PO PACK: 17.0000 g | PACK | Freq: Every day | ORAL | Status: DC | PRN

## 2012-10-30 MED ORDER — ENOXAPARIN SODIUM 40 MG/0.4ML ~~LOC~~ SOLN
40.0000 mg | SUBCUTANEOUS | Status: DC
  Administered 2012-10-30 – 2012-11-02 (×3): 40 mg via SUBCUTANEOUS
  Filled 2012-10-30 (×5): qty 0.4

## 2012-10-30 MED ORDER — HYOSCYAMINE SULFATE ER 0.375 MG PO TB12
0.3750 mg | ORAL_TABLET | Freq: Two times a day (BID) | ORAL | Status: DC
  Administered 2012-10-30 – 2012-11-03 (×8): 0.375 mg via ORAL
  Filled 2012-10-30 (×9): qty 1

## 2012-10-30 MED ORDER — ERGOCALCIFEROL 1.25 MG (50000 UT) PO CAPS
50000.0000 [IU] | ORAL_CAPSULE | ORAL | Status: DC
  Administered 2012-10-31: 50000 [IU] via ORAL
  Filled 2012-10-30: qty 1

## 2012-10-30 MED ORDER — ACETAMINOPHEN 325 MG PO TABS: 650.0000 mg | ORAL_TABLET | Freq: Four times a day (QID) | ORAL | Status: DC | PRN

## 2012-10-30 MED ORDER — ONDANSETRON HCL 4 MG PO TABS: 4.0000 mg | ORAL_TABLET | Freq: Four times a day (QID) | ORAL | Status: DC | PRN

## 2012-10-30 MED ORDER — BISACODYL 10 MG RE SUPP: 10.0000 mg | Freq: Every day | RECTAL | Status: DC | PRN

## 2012-10-30 MED ORDER — GABAPENTIN 300 MG PO CAPS
300.0000 mg | ORAL_CAPSULE | Freq: Two times a day (BID) | ORAL | Status: DC
  Administered 2012-10-30 – 2012-11-03 (×8): 300 mg via ORAL
  Filled 2012-10-30 (×9): qty 1

## 2012-10-30 MED ORDER — SODIUM CHLORIDE 0.9 % IV SOLN
INTRAVENOUS | Status: DC
  Administered 2012-10-31 – 2012-11-01 (×3): via INTRAVENOUS

## 2012-10-30 MED ORDER — ONDANSETRON HCL 4 MG/2ML IJ SOLN: 4.0000 mg | Freq: Four times a day (QID) | INTRAMUSCULAR | Status: DC | PRN

## 2012-10-30 NOTE — ED Notes (Signed)
Pt c/o of fall two days ago. States that she had a compress L4 fracture, currently wears a spinal brace. States "I think I have injured myself more. Pt has skin tears bilateral arms.

## 2012-10-30 NOTE — ED Provider Notes (Signed)
CSN: [id number]     Arrival date & time 10/30/12  1704 History   First MD Initiated Contact with Patient 10/30/12 1806     Chief Complaint  Patient presents with  . Back Pain  . Fall   (Consider location/radiation/quality/duration/timing/severity/associated sxs/prior Treatment) HPI  [name] is a 78 y.o.female with multiple medical issues presents to the ER with complaints of severe back pain. She sustained a L4 compression fracture in May of 2013. She was admitted at that time for pain control because she was unable to walk due to pain. The patient is a very poor historian and difficult to focus. She said that this past Friday she tripped going inside the house in the door way because her walker got caught on the door frame and fell over her walker. Then alter that day, her foot got caught on the sprinkler and she fell a second time. Since then her pain is so severe she is unable to walk. She describes it as unbearable. The patient denies hitting her head or having head or neck pain. She has multiple skin tears and bruises to bilateral upper and lower arms. The patient is in distress due to pain. Says that the PO Percocet at home is not helping her pain. Denies being unable to control her bowel or urine. Denies being unable to move her legs or feel her toes.    Past Medical History  Diagnosis Date  . IBS (irritable bowel syndrome)   . Fibromyalgia   . History of DVT (deep vein thrombosis)   . History of pulmonary embolism   . Osteoporosis   . History of breast cancer LEFT BREAST DCIS  1996  . Hyperlipidemia   . 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 Breast cancer RIGHT SIDE-- DX OCT 2011    CHEMORADIATION THERAPY-- COMPLETED   . PONV (postoperative nausea and vomiting)   . Skin tear LEFT ARM COVERED W/ TEGADERM    PT STATES HX MULTIPLE SKIN TEARS -- THIN  . Thin skin SECONARDY AGE AND HX CHEMO  . Ecchymosis   . Seasonal allergies   .  Frequency of urination   . Nocturia   . Anemia   . Lung mass PER DR CLANCE NOTE UNCLEAR IF LYMPHOMA FROM BX    FOLLOWED BY DR RUBIN  . Seizures   . Unspecified hereditary and idiopathic peripheral neuropathy   . Asthma   . Fibromyalgia    Past Surgical History  Procedure Laterality Date  . Total abdominal hysterectomy w/ bilateral salpingoophorectomy  1977  (APPROX)  . Cholecystectomy    . Appendectomy    . Femur fracture surgery  12-18-2010  DR BEANE    INTRAMEDULLARY NAILING LEFT INTERTROCHANTERIC -SUBTROCHANTERIC FX  . Right breast lumpectomy / removal lymph nodes x2/ removal pac  10-01-2010    CARCINOMA RIGHT BREAST  . Placement port- a- cath  02-18-2010  . Bronchoscopy  01-29-2010    W/ BX  . Tvt vaginal tape  suburethral sling   06-08-2005    SUI  . Bilateral laminectomy/ foraminotomy  01-14-2004    L4 - L5  . Breast lumpectomy  1996     LEFT BREAST DCIS   . Cataract extraction w/ intraocular lens  implant, bilateral    . Bilateral carpal tunnel release    . Tonsillectomy    . Transthoracic echocardiogram  02-25-2010    LVSF NORMAL/ EF 55-60%/ GRADE I DIASTOLIC DYSFUNCTION/ MILDLY DILATED LEFT ATRIUM  .  Hardware removal  10/11/2011    Procedure: HARDWARE REMOVAL;  Surgeon: Javier Docker, MD;  Location: [facility];  Service: Orthopedics;  Laterality: Left;  LEFT THIGH REMOVAL OF DISTAL LOCKING SCREW NEEDS: FLORO, RADIO LUSCENT TABLE AND SCREWDRIVER FOR BIOMET ROD   . Colonoscopy     Family History  Problem Relation Age of Onset  . Emphysema Father   . Ovarian cancer Mother   . Cancer Mother     cervical  . Breast cancer Maternal Grandmother   . Cancer Sister     cervical   History  Substance Use Topics  . Smoking status: Former Smoker -- 1.00 packs/day for 30 years    Types: Cigarettes    Quit date: 03/01/1978  . Smokeless tobacco: Never Used  . Alcohol Use: 4.2 oz/week    7 Glasses of wine per week   OB History   Grav Para Term Preterm  Abortions TAB SAB Ect Mult Living                 Review of Systems ROS is negative unless otherwise stated in the HPI  Allergies  Penicillins; Adhesive; Doxycycline; and Morphine and related  Home Medications   Current Outpatient Rx  Name  Route  Sig  Dispense  Refill  . dexlansoprazole (DEXILANT) 60 MG capsule   Oral   Take 60 mg by mouth 2 (two) times daily.          . DULoxetine (CYMBALTA) 60 MG capsule   Oral   Take 60 mg by mouth daily.         . ergocalciferol (VITAMIN D2) 50000 UNITS capsule   Oral   Take 50,000 Units by mouth once a week. tuesday         . ezetimibe-simvastatin (VYTORIN) 10-80 MG per tablet   Oral   Take 1 tablet by mouth at bedtime.          . gabapentin (NEURONTIN) 100 MG capsule   Oral   Take 300 mg by mouth 2 (two) times daily.          . hyoscyamine (LEVBID) 0.375 MG 12 hr tablet   Oral   Take 1 tablet by mouth every 12 (twelve) hours.         . levETIRAcetam (KEPPRA) 500 MG tablet   Oral   Take 1 tablet (500 mg total) by mouth 2 (two) times daily.   180 tablet   3   . levothyroxine (SYNTHROID, LEVOTHROID) 88 MCG tablet   Oral   Take 88 mcg by mouth every morning.          Marland Kitchen LORazepam (ATIVAN) 0.5 MG tablet   Oral   Take 1 tablet (0.5 mg total) by mouth every 8 (eight) hours as needed. Anxiety   30 tablet   0     Patient to contact PCP for further refills.   . metroNIDAZOLE (METROGEL) 1 % gel   Topical   Apply 1 application topically daily. Applied to face for rosacea.         Marland Kitchen MICRONIZED COLESTIPOL HCL 1 G tablet   Oral   Take 1 tablet by mouth daily.         Marland Kitchen oxyCODONE-acetaminophen (PERCOCET) 5-325 MG per tablet   Oral   Take 1 tablet by mouth every 4 (four) hours as needed for pain.   20 tablet   0   . predniSONE (DELTASONE) 5 MG tablet   Oral   Take  1 tablet (5 mg total) by mouth daily with breakfast.   14 tablet   0   . UCERIS 9 MG TB24   Oral   Take 1 capsule by mouth every 12  (twelve) hours.          BP 143/63  Pulse 97  Temp(Src) 97.8 F (36.6 C) (Oral)  Resp 17  SpO2 97% Physical Exam  Nursing note and vitals reviewed. Constitutional: She appears well-developed and well-nourished. No distress.  HENT:  Head: Normocephalic and atraumatic.  Eyes: Pupils are equal, round, and reactive to light.  Neck: Normal range of motion. Neck supple.  Cardiovascular: Normal rate and regular rhythm.   Pulmonary/Chest: Effort normal.  Abdominal: Soft.  Musculoskeletal:       Lumbar back: She exhibits decreased range of motion (due to pain), tenderness, bony tenderness, swelling, pain and spasm. She exhibits no edema, no deformity, no laceration and normal pulse.       Back:  Neurological: She is alert.  Skin: Skin is warm and dry.    ED Course  Procedures (including critical care time) Labs Review Labs Reviewed  BASIC METABOLIC PANEL - Abnormal; Notable for the following:    Glucose, Bld 121 (*)    GFR calc non Af Amer 59 (*)    GFR calc Af Amer 68 (*)    All other components within normal limits  CBC WITH DIFFERENTIAL   Imaging Review Dg Cervical Spine Complete  10/30/2012   *RADIOLOGY REPORT*  Clinical Data: Fall with neck pain  CERVICAL SPINE - COMPLETE 4+ VIEW  Comparison: None  Findings: Normal alignment is noted. There is no evidence of fracture, subluxation or prevertebral soft tissue swelling. Mild multilevel degenerative disc disease, spondylosis and facet arthropathy noted throughout the cervical spine. Mild right bony foraminal narrowing at C3-C4 noted. No focal bony lesions are present.  IMPRESSION: No static evidence of acute injury to the cervical spine.  Mild multilevel degenerative changes.   Original Report Authenticated By: Harmon Pier, M.D.   Dg Lumbar Spine Complete  10/30/2012   CLINICAL DATA:  78 year old female status post fall with persistent low back pain. L4 fracture diagnosed in May.  EXAM: LUMBAR SPINE - COMPLETE 4+ VIEW  COMPARISON:   Abdomen CT 08/07/2012. Lumbar spine series 29562.  FINDINGS: Osteopenia. Mildly increased compression of the L4 superior endplate which now appears more sclerotic. L3 and L5 appear intact. The L2 superior endplate now is compressed, new since 07/25/2012. L1 in the visible lower thoracic levels appear stable. Trace anterolisthesis of L4 on L5 is stable. Extensive aortoiliac calcified atherosclerosis. Grossly intact sacral a ala. Left hip ORIF.  IMPRESSION: 1. Acute or subacute mild L2 superior endplate fracture/compression fracture, new since 08/07/2012.  2. Stable to mildly progressed L4 compression fracture which was present on 07/25/2012.  3. Stable lumbar spine otherwise. Osteopenia.   Electronically Signed   By: Augusto Gamble   On: 10/30/2012 18:55    MDM   1. Compression fracture of L2, closed, initial encounter    Patient has sustained a new L2 compression fracture while L4 compression fracture from previous injury is stable.  Pt and family members are requesting that she be admitted for pain control and the patient husband is refusing to take her home because her pain is so severe and he can't help her at the home.  I spoke with Dr. Clelia Croft with [facility] who has kindly agreed to come see and admit patient.     Julann Mcgilvray G  Neva Seat, PA-C 10/30/12 1946

## 2012-10-30 NOTE — H&P (Signed)
PCP:   Julian Hy, MD   Chief Complaint:  Severe back pain after fall  HPI: [name] is a 78 year old white female with severe osteoporosis with prior L4 compression fracture (5/14), adrenal insufficiency on chronic steroids, and prior Breast Cancer s/p chemo/XRT who presented to the emergency department with intractable back pain following a fall 5 days ago. The patient has been experiencing severe low back pain since a fall in 5/14 which resulted in an L4 compression fracture. She's been wearing a TLSO brace since then anticipating in physical therapy. Her pain has been persistent requiring Percocet every 4 hours. She has declined OxyContin for basal pain control. She's been awaiting a visit with Dr. Danielle Dess, neurosurgery to discuss further management of this. She's been using a walker and has to walk stooped over. 5 days ago she tripped over the threshold going to her house with a walker resulting in a fall. Shoulder thereafter, she tripped over a flower pot and fell even harder. Since that time her pain has been excruciating and uncontrolled with Percocet every 4 hours. At the behest of her daughter she came to the emergency department this evening where she was found have a new L2 compression fracture. Her pain is minimally controlled with Tylenol and and fentanyl the emergency department and she will require admission for pain management. She denies any radiation of pain down her legs. No focal weakness. Chronic numbness in her feet related to neuropathy secondary to chemotherapy  Review of Systems:  Review of Systems - All systems reviewed with patient and are negative except as in history of present illness with following exceptions: Thin skin with multiple skin tears related to her falls, chronic neuropathy related to her chemotherapy, chronic diarrhea secondary to irritable bowel syndrome,  Past Medical History: Past Medical History  Diagnosis Date  . IBS (irritable bowel syndrome)    . Fibromyalgia   . History of DVT (deep vein thrombosis)   . History of pulmonary embolism   . Osteoporosis   . History of breast cancer LEFT BREAST DCIS  1996  . Hyperlipidemia   . 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 Breast cancer RIGHT SIDE-- DX OCT 2011    CHEMORADIATION THERAPY-- COMPLETED   . PONV (postoperative nausea and vomiting)   . Skin tear LEFT ARM COVERED W/ TEGADERM    PT STATES HX MULTIPLE SKIN TEARS -- THIN  . Thin skin SECONARDY AGE AND HX CHEMO  . Ecchymosis   . Seasonal allergies   . Frequency of urination   . Nocturia   . Anemia   . Lung mass PER DR CLANCE NOTE UNCLEAR IF LYMPHOMA FROM BX    FOLLOWED BY DR RUBIN  . Seizures   . Unspecified hereditary and idiopathic peripheral neuropathy   . Asthma   . Fibromyalgia   . Vertebral compression fracture 5/14    L4    Past Surgical History  Procedure Laterality Date  . Total abdominal hysterectomy w/ bilateral salpingoophorectomy  1977  (APPROX)  . Cholecystectomy    . Appendectomy    . Femur fracture surgery  12-18-2010  DR BEANE    INTRAMEDULLARY NAILING LEFT INTERTROCHANTERIC -SUBTROCHANTERIC FX  . Right breast lumpectomy / removal lymph nodes x2/ removal pac  10-01-2010    CARCINOMA RIGHT BREAST  . Placement port- a- cath  02-18-2010  . Bronchoscopy  01-29-2010    W/ BX  . Tvt vaginal tape  suburethral sling  06-08-2005    SUI  . Bilateral laminectomy/ foraminotomy  01-14-2004    L4 - L5  . Breast lumpectomy  1996     LEFT BREAST DCIS   . Cataract extraction w/ intraocular lens  implant, bilateral    . Bilateral carpal tunnel release    . Tonsillectomy    . Transthoracic echocardiogram  02-25-2010    LVSF NORMAL/ EF 55-60%/ GRADE I DIASTOLIC DYSFUNCTION/ MILDLY DILATED LEFT ATRIUM  . Hardware removal  10/11/2011    Procedure: HARDWARE REMOVAL;  Surgeon: Javier Docker, MD;  Location: [facility];  Service: Orthopedics;  Laterality: Left;   LEFT THIGH REMOVAL OF DISTAL LOCKING SCREW NEEDS: FLORO, RADIO LUSCENT TABLE AND SCREWDRIVER FOR BIOMET ROD   . Colonoscopy      Medications: Prior to Admission medications   Medication Sig Start Date End Date Taking? Authorizing Provider  dexlansoprazole (DEXILANT) 60 MG capsule Take 60 mg by mouth 2 (two) times daily.    Yes Historical Provider, MD  DULoxetine (CYMBALTA) 60 MG capsule Take 60 mg by mouth daily.   Yes Historical Provider, MD  ergocalciferol (VITAMIN D2) 50000 UNITS capsule Take 50,000 Units by mouth once a week. tuesday   Yes Historical Provider, MD  ezetimibe-simvastatin (VYTORIN) 10-80 MG per tablet Take 1 tablet by mouth at bedtime.    Yes Historical Provider, MD  gabapentin (NEURONTIN) 100 MG capsule Take 300 mg by mouth 2 (two) times daily.    Yes Historical Provider, MD  hyoscyamine (LEVBID) 0.375 MG 12 hr tablet Take 1 tablet by mouth every 12 (twelve) hours. 07/10/12  Yes Historical Provider, MD  levETIRAcetam (KEPPRA) 500 MG tablet Take 1 tablet (500 mg total) by mouth 2 (two) times daily. 06/14/12  Yes Levert Feinstein, MD  levothyroxine (SYNTHROID, LEVOTHROID) 88 MCG tablet Take 88 mcg by mouth every morning.    Yes Historical Provider, MD  LORazepam (ATIVAN) 0.5 MG tablet Take 1 tablet (0.5 mg total) by mouth every 8 (eight) hours as needed. Anxiety 07/04/12  Yes Keitha Butte, NP  metroNIDAZOLE (METROGEL) 1 % gel Apply 1 application topically daily. Applied to face for rosacea.   Yes Historical Provider, MD  MICRONIZED COLESTIPOL HCL 1 G tablet Take 1 tablet by mouth daily. 07/10/12  Yes Historical Provider, MD  oxyCODONE-acetaminophen (PERCOCET) 5-325 MG per tablet Take 1 tablet by mouth every 4 (four) hours as needed for pain. 07/25/12  Yes Gilda Crease, MD  predniSONE (DELTASONE) 5 MG tablet Take 1 tablet (5 mg total) by mouth daily with breakfast. 07/29/12  Yes Julian Hy, MD  UCERIS 9 MG TB24 Take 1 capsule by mouth every 12 (twelve) hours. 07/10/12   Yes Historical Provider, MD    Allergies:   Allergies  Allergen Reactions  . Penicillins Anaphylaxis  . Adhesive [Tape] Other (See Comments)    Blisters   . Doxycycline Nausea And Vomiting  . Morphine And Related Other (See Comments)    HALLUCINATIONS    Social History:  reports that she quit smoking about 34 years ago. Her smoking use included Cigarettes. She has a 30 pack-year smoking history. She has never used smokeless tobacco. She reports that she drinks about 4.2 ounces of alcohol per week. She reports that she does not use illicit drugs.  Family History: Family History  Problem Relation Age of Onset  . Emphysema Father   . Ovarian cancer Mother   . Cancer Mother     cervical  . Breast cancer Maternal  Grandmother   . Cancer Sister     cervical    Physical Exam: Filed Vitals:   10/30/12 1717 10/30/12 1859  BP: 102/68 143/63  Pulse: 115 97  Temp: 97.8 F (36.6 C)   TempSrc: Oral   Resp: 22 17  SpO2: 94% 97%   General appearance: alert and In obvious discomfort with any movement; cushingoid Head: Normocephalic, without obvious abnormality, atraumatic Eyes: conjunctivae/corneas clear. PERRL, EOM's intact.  Nose: Nares normal. Septum midline. Mucosa normal. No drainage or sinus tenderness. Throat: lips, mucosa, and tongue normal; teeth and gums normal Neck: no adenopathy, no carotid bruit, no JVD and thyroid not enlarged, symmetric, no tenderness/mass/nodules Resp: clear to auscultation bilaterally Cardio: regular rate and rhythm GI: soft, non-tender; bowel sounds normal; no masses,  no organomegaly Extremities: extremities normal, atraumatic, no cyanosis or edema Pulses: 2+ and symmetric Lymph nodes: Cervical adenopathy: no cervical lymphadenopathy Neurologic: Alert and oriented X 3, normal strength and tone.    Labs on Admission:   Recent Labs  10/30/12 1745  NA 141  K 4.7  CL 101  CO2 31  GLUCOSE 121*  BUN 18  CREATININE 0.92  CALCIUM 9.6      Recent Labs  10/30/12 1745  WBC 9.7  NEUTROABS 7.0  HGB 14.2  HCT 43.5  MCV 95.8  PLT 232    Lab Results  Component Value Date   INR 0.94 12/18/2010   INR 1.07 04/01/2010   INR 0.94 02/10/2010    Radiological Exams on Admission: Dg Cervical Spine Complete  10/30/2012   *RADIOLOGY REPORT*  Clinical Data: Fall with neck pain  CERVICAL SPINE - COMPLETE 4+ VIEW  Comparison: None  Findings: Normal alignment is noted. There is no evidence of fracture, subluxation or prevertebral soft tissue swelling. Mild multilevel degenerative disc disease, spondylosis and facet arthropathy noted throughout the cervical spine. Mild right bony foraminal narrowing at C3-C4 noted. No focal bony lesions are present.  IMPRESSION: No static evidence of acute injury to the cervical spine.  Mild multilevel degenerative changes.   Original Report Authenticated By: Harmon Pier, M.D.   Dg Lumbar Spine Complete  10/30/2012   CLINICAL DATA:  78 year old female status post fall with persistent low back pain. L4 fracture diagnosed in May.  EXAM: LUMBAR SPINE - COMPLETE 4+ VIEW  COMPARISON:  Abdomen CT 08/07/2012. Lumbar spine series [id number].  FINDINGS: Osteopenia. Mildly increased compression of the L4 superior endplate which now appears more sclerotic. L3 and L5 appear intact. The L2 superior endplate now is compressed, new since 07/25/2012. L1 in the visible lower thoracic levels appear stable. Trace anterolisthesis of L4 on L5 is stable. Extensive aortoiliac calcified atherosclerosis. Grossly intact sacral a ala. Left hip ORIF.  IMPRESSION: 1. Acute or subacute mild L2 superior endplate fracture/compression fracture, new since 08/07/2012.  2. Stable to mildly progressed L4 compression fracture which was present on 07/25/2012.  3. Stable lumbar spine otherwise. Osteopenia.   Electronically Signed   By: Augusto Gamble   On: 10/30/2012 18:55   Orders placed during the hospital encounter of 02/22/12  . ED EKG  . ED EKG  . EKG  12-LEAD  . EKG 12-LEAD  . EKG    Assessment/Plan Principal Problem: 1. Vertebral compression fracture- she has a new L2 compression fracture superimposed on prior L4 compression fracture from 5/14. Her pain was poorly controlled from her initial fracture and she's been awaiting an appointment with neurosurgery. We'll obtain an MRI of the lumbar spine to further evaluate the  fractures. We'll consult interventional radiology to consider vertebroplasty, potentially of both L2 and L4. She requires admission for pain management-will use Dilaudid every 2 hours and add fentanyl patch for longer acting pain control. We'll resume Miacalcin which may also help.  Active Problems: 2. Osteoporosis-she reports that she discussed treatment options with Dr. Donnie Coffin and Dr. [name]. Elected to do IV Reclast but this has not been scheduled yet. She's not a candidate for Forteo due to prior radiation therapy.  Will try to arrange Reclast ASAP after discharge. 3. Unsteady gait with Recurrent falls- PT/OT evaluation for fall prevention. 4. Adrenal insufficiency- we'll continue her low-dose prednisone. We'll hold off on stress dose unless she has hemodynamic compromise. 5. Irritable bowel syndrome- she is on budesonide in addition to Prednisone. We'll clarify whether she needs both.  Since her prednisone dose is so low will continue both for now. 6. Peripheral neuropathy- continue Cymbalta and Neurontin. May be contributing to her falls. 7. Disposition-anticipate discharge to home in 2-3 days pending interventional radiology evaluation for vertebroplasty and control of pain.  Houa Nie,W DOUGLAS 10/30/2012, 8:43 PM

## 2012-10-30 NOTE — ED Provider Notes (Signed)
Medical screening examination/treatment/procedure(s) were conducted as a shared visit with non-physician practitioner(s) and myself.  I personally evaluated the patient during the encounter  [name] is a 78 y.o. female here with fall and back pain. Had L4 fracture previously and patient wanted conservative management only. Larey Seat recently and now has L2 compression fracture. She seemed to be in much pain. [facility] called to admit for pain control. Neurovascular intact for now.    Richardean Canal, MD 10/30/12 [phone]

## 2012-10-31 ENCOUNTER — Inpatient Hospital Stay (HOSPITAL_COMMUNITY): Payer: Medicare Other

## 2012-10-31 ENCOUNTER — Encounter (HOSPITAL_COMMUNITY): Payer: Self-pay | Admitting: Radiology

## 2012-10-31 LAB — APTT: aPTT: 22 seconds — ABNORMAL LOW (ref 24–37)

## 2012-10-31 MED ORDER — FENTANYL CITRATE 0.05 MG/ML IJ SOLN
INTRAMUSCULAR | Status: AC
  Filled 2012-10-31: qty 6

## 2012-10-31 MED ORDER — VANCOMYCIN HCL IN DEXTROSE 1-5 GM/200ML-% IV SOLN
1000.0000 mg | INTRAVENOUS | Status: AC
  Administered 2012-11-01: 1000 mg via INTRAVENOUS
  Filled 2012-10-31: qty 200

## 2012-10-31 MED ORDER — MIDAZOLAM HCL 2 MG/2ML IJ SOLN
INTRAMUSCULAR | Status: AC
  Filled 2012-10-31: qty 6

## 2012-10-31 MED ORDER — GADOBENATE DIMEGLUMINE 529 MG/ML IV SOLN
16.0000 mL | Freq: Once | INTRAVENOUS | Status: AC | PRN
  Administered 2012-10-31: 16 mL via INTRAVENOUS

## 2012-10-31 MED ORDER — FENTANYL CITRATE 0.05 MG/ML IJ SOLN
INTRAMUSCULAR | Status: AC | PRN
  Administered 2012-10-31: 15:00:00 50 ug via INTRAVENOUS

## 2012-10-31 MED ORDER — MIDAZOLAM HCL 2 MG/2ML IJ SOLN
INTRAMUSCULAR | Status: AC | PRN
  Administered 2012-10-31: 1 mg via INTRAVENOUS

## 2012-10-31 MED ORDER — LIDOCAINE HCL (PF) 1 % IJ SOLN
INTRAMUSCULAR | Status: AC
  Filled 2012-10-31: qty 30

## 2012-10-31 NOTE — Progress Notes (Signed)
Called [name] at [facility] to obtain insurance pre-authorization for L2 KP.  Primary ins is Medicare which does not require a precert.  Secondary ins is [employer].  [name] called [employer] and no precert is required for this procedure.

## 2012-10-31 NOTE — ED Notes (Signed)
Unable to obtain VS at present due to limb restriction. Will place on pt leg. Trying to gain IV access at present.

## 2012-10-31 NOTE — ED Notes (Signed)
Pt place supine for procedure. Given Versed 1mg  and Fentanyl 50 mcg for procedure. Pt became unresponsive and cyanotic. Increased O2 with no resolve of symptoms. O2 Sat 50% and minimal increase of value with O2 increase. Dr. Deanne Coffer at beside. Pt placed prone in her inpatient bed. Placed on O2 mask with sternal rubs. Narcan 2mg  IV given and IV fluids increased. Pt became responsive at that time. Family notified.

## 2012-10-31 NOTE — Progress Notes (Signed)
PT Cancellation Note  Patient Details Name: [name] MRN: [id number] DOB: 1934-05-02   Cancelled Treatment:    Reason Eval/Treat Not Completed: Medical issues which prohibited therapy (scheduled for KP today)   Rada Hay 10/31/2012, 11:21 AM Blanchard Kelch PT [phone]

## 2012-10-31 NOTE — H&P (Signed)
HPI: [name] is an 78 y.o. female with severe back pain after a fall recently. She actually had a diagnosed L4 comp fracture back from 5/14, has been wearing a brace and participating in PT, which she states has helped. She has noticed an improvement in her low back pain. However, she has fallen/tripped twice within the past week and now has severe back pain that is located above her prior site of pain. She has been admitted. MRI done now shows an acute fracture at L2 level without retropulsion. L4 fracture is now healed with minimal/no edema noted. There are degenerative changes and L4-L5 spinal stenosis which could be the source of her chronic low back pain. IR is asked to eval MRI and treat with VP/KP if amenable. Dr. Deanne Coffer has reviewed MRI and feels pt is good candidate for KP at L2 level. PMHx and meds reviewed. Prior hx of breast cancer, fully treated, suspect benign fracture related to osteoporosis and chronic steroid use.  Past Medical History:  Past Medical History  Diagnosis Date  . IBS (irritable bowel syndrome)   . Fibromyalgia   . History of DVT (deep vein thrombosis)   . History of pulmonary embolism   . Osteoporosis   . History of breast cancer LEFT BREAST DCIS  1996  . Hyperlipidemia   . 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 Breast cancer RIGHT SIDE-- DX OCT 2011    CHEMORADIATION THERAPY-- COMPLETED   . PONV (postoperative nausea and vomiting)   . Skin tear LEFT ARM COVERED W/ TEGADERM    PT STATES HX MULTIPLE SKIN TEARS -- THIN  . Thin skin SECONARDY AGE AND HX CHEMO  . Ecchymosis   . Seasonal allergies   . Frequency of urination   . Nocturia   . Anemia   . Lung mass PER DR CLANCE NOTE UNCLEAR IF LYMPHOMA FROM BX    FOLLOWED BY DR RUBIN  . Seizures   . Unspecified hereditary and idiopathic peripheral neuropathy   . Asthma   . Fibromyalgia   . Vertebral compression fracture 5/14    L4    Past Surgical  History:  Past Surgical History  Procedure Laterality Date  . Total abdominal hysterectomy w/ bilateral salpingoophorectomy  1977  (APPROX)  . Cholecystectomy    . Appendectomy    . Femur fracture surgery  12-18-2010  DR BEANE    INTRAMEDULLARY NAILING LEFT INTERTROCHANTERIC -SUBTROCHANTERIC FX  . Right breast lumpectomy / removal lymph nodes x2/ removal pac  10-01-2010    CARCINOMA RIGHT BREAST  . Placement port- a- cath  02-18-2010  . Bronchoscopy  01-29-2010    W/ BX  . Tvt vaginal tape  suburethral sling   06-08-2005    SUI  . Bilateral laminectomy/ foraminotomy  01-14-2004    L4 - L5  . Breast lumpectomy  1996     LEFT BREAST DCIS   . Cataract extraction w/ intraocular lens  implant, bilateral    . Bilateral carpal tunnel release    . Tonsillectomy    . Transthoracic echocardiogram  02-25-2010    LVSF NORMAL/ EF 55-60%/ GRADE I DIASTOLIC DYSFUNCTION/ MILDLY DILATED LEFT ATRIUM  . Hardware removal  10/11/2011    Procedure: HARDWARE REMOVAL;  Surgeon: Javier Docker, MD;  Location: [facility];  Service: Orthopedics;  Laterality: Left;  LEFT THIGH REMOVAL OF DISTAL LOCKING SCREW NEEDS: FLORO, RADIO LUSCENT TABLE AND SCREWDRIVER FOR BIOMET ROD   . Colonoscopy  Family History:  Family History  Problem Relation Age of Onset  . Emphysema Father   . Ovarian cancer Mother   . Cancer Mother     cervical  . Breast cancer Maternal Grandmother   . Cancer Sister     cervical    Social History:  reports that she quit smoking about 34 years ago. Her smoking use included Cigarettes. She has a 30 pack-year smoking history. She has never used smokeless tobacco. She reports that she drinks about 4.2 ounces of alcohol per week. She reports that she does not use illicit drugs.  Allergies:  Allergies  Allergen Reactions  . Penicillins Anaphylaxis  . Adhesive [Tape] Other (See Comments)    Blisters   . Doxycycline Nausea And Vomiting  . Morphine And Related Other  (See Comments)    HALLUCINATIONS    Medications:   Medication List    ASK your doctor about these medications       DEXILANT 60 MG capsule  Generic drug:  dexlansoprazole  Take 60 mg by mouth 2 (two) times daily.     DULoxetine 60 MG capsule  Commonly known as:  CYMBALTA  Take 60 mg by mouth daily.     ergocalciferol 50000 UNITS capsule  Commonly known as:  VITAMIN D2  Take 50,000 Units by mouth once a week. tuesday     ezetimibe-simvastatin 10-80 MG per tablet  Commonly known as:  VYTORIN  Take 1 tablet by mouth at bedtime.     gabapentin 100 MG capsule  Commonly known as:  NEURONTIN  Take 300 mg by mouth 2 (two) times daily.     hyoscyamine 0.375 MG 12 hr tablet  Commonly known as:  LEVBID  Take 1 tablet by mouth every 12 (twelve) hours.     levETIRAcetam 500 MG tablet  Commonly known as:  KEPPRA  Take 1 tablet (500 mg total) by mouth 2 (two) times daily.     levothyroxine 88 MCG tablet  Commonly known as:  SYNTHROID, LEVOTHROID  Take 88 mcg by mouth every morning.     LORazepam 0.5 MG tablet  Commonly known as:  ATIVAN  Take 1 tablet (0.5 mg total) by mouth every 8 (eight) hours as needed. Anxiety     metroNIDAZOLE 1 % gel  Commonly known as:  METROGEL  Apply 1 application topically daily. Applied to face for rosacea.     MICRONIZED COLESTIPOL HCL 1 G tablet  Generic drug:  colestipol  Take 1 tablet by mouth daily.     oxyCODONE-acetaminophen 5-325 MG per tablet  Commonly known as:  PERCOCET  Take 1 tablet by mouth every 4 (four) hours as needed for pain.     predniSONE 5 MG tablet  Commonly known as:  DELTASONE  Take 1 tablet (5 mg total) by mouth daily with breakfast.     UCERIS 9 MG Tb24  Generic drug:  Budesonide  Take 1 capsule by mouth every 12 (twelve) hours.        Please HPI for pertinent positives, otherwise complete 10 system ROS negative.  Physical Exam: BP 131/72  Pulse 78  Temp(Src) 98.1 F (36.7 C) (Oral)  Resp 18  Ht 5'  7" (1.702 m)  Wt 174 lb (78.926 kg)  BMI 27.25 kg/m2  SpO2 90% Body mass index is 27.25 kg/(m^2).   General Appearance:  Alert, cooperative, no distress, appears stated age  Head:  Normocephalic, without obvious abnormality, atraumatic  ENT: Unremarkable  Neck: Supple, symmetrical, trachea midline  Lungs:   Clear to auscultation bilaterally, no w/r/r, respirations unlabored without use of accessory muscles.  Back:  Very tender superior lumbar aspect c/w findings. Do palpable defects.  Heart:  Regular rate and rhythm, S1, S2 normal, no murmur, rub or gallop.  Neurologic: Normal affect, no gross deficits.   Results for orders placed during the hospital encounter of 10/30/12 (from the past 48 hour(s))  CBC WITH DIFFERENTIAL     Status: None   Collection Time    10/30/12  5:45 PM      Result Value Range   WBC 9.7  4.0 - 10.5 K/uL   RBC 4.54  3.87 - 5.11 MIL/uL   Hemoglobin 14.2  12.0 - 15.0 g/dL   HCT 16.1  09.6 - 04.5 %   MCV 95.8  78.0 - 100.0 fL   MCH 31.3  26.0 - 34.0 pg   MCHC 32.6  30.0 - 36.0 g/dL   RDW 40.9  81.1 - 91.4 %   Platelets 232  150 - 400 K/uL   Neutrophils Relative % 72  43 - 77 %   Neutro Abs 7.0  1.7 - 7.7 K/uL   Lymphocytes Relative 20  12 - 46 %   Lymphs Abs 1.9  0.7 - 4.0 K/uL   Monocytes Relative 8  3 - 12 %   Monocytes Absolute 0.7  0.1 - 1.0 K/uL   Eosinophils Relative 0  0 - 5 %   Eosinophils Absolute 0.0  0.0 - 0.7 K/uL   Basophils Relative 0  0 - 1 %   Basophils Absolute 0.0  0.0 - 0.1 K/uL  BASIC METABOLIC PANEL     Status: Abnormal   Collection Time    10/30/12  5:45 PM      Result Value Range   Sodium 141  135 - 145 mEq/L   Potassium 4.7  3.5 - 5.1 mEq/L   Chloride 101  96 - 112 mEq/L   CO2 31  19 - 32 mEq/L   Glucose, Bld 121 (*) 70 - 99 mg/dL   BUN 18  6 - 23 mg/dL   Creatinine, Ser 7.82  0.50 - 1.10 mg/dL   Calcium 9.6  8.4 - 95.6 mg/dL   GFR calc non Af Amer 59 (*) >90 mL/min   GFR calc Af Amer 68 (*) >90 mL/min   Comment: (NOTE)      The eGFR has been calculated using the CKD EPI equation.     This calculation has not been validated in all clinical situations.     eGFR's persistently <90 mL/min signify possible Chronic Kidney     Disease.   Dg Cervical Spine Complete  10/30/2012   *RADIOLOGY REPORT*  Clinical Data: Fall with neck pain  CERVICAL SPINE - COMPLETE 4+ VIEW  Comparison: None  Findings: Normal alignment is noted. There is no evidence of fracture, subluxation or prevertebral soft tissue swelling. Mild multilevel degenerative disc disease, spondylosis and facet arthropathy noted throughout the cervical spine. Mild right bony foraminal narrowing at C3-C4 noted. No focal bony lesions are present.  IMPRESSION: No static evidence of acute injury to the cervical spine.  Mild multilevel degenerative changes.   Original Report Authenticated By: Harmon Pier, M.D.   Dg Lumbar Spine Complete  10/30/2012   CLINICAL DATA:  78 year old female status post fall with persistent low back pain. L4 fracture diagnosed in May.  EXAM: LUMBAR SPINE - COMPLETE 4+ VIEW  COMPARISON:  Abdomen CT 08/07/2012. Lumbar spine series 21308.  FINDINGS: Osteopenia. Mildly increased compression of the L4 superior endplate which now appears more sclerotic. L3 and L5 appear intact. The L2 superior endplate now is compressed, new since 07/25/2012. L1 in the visible lower thoracic levels appear stable. Trace anterolisthesis of L4 on L5 is stable. Extensive aortoiliac calcified atherosclerosis. Grossly intact sacral a ala. Left hip ORIF.  IMPRESSION: 1. Acute or subacute mild L2 superior endplate fracture/compression fracture, new since 08/07/2012.  2. Stable to mildly progressed L4 compression fracture which was present on 07/25/2012.  3. Stable lumbar spine otherwise. Osteopenia.   Electronically Signed   By: Augusto Gamble   On: 10/30/2012 18:55   [name] Contrast  10/31/2012   *RADIOLOGY REPORT*  Clinical Data: Severe back pain.  Compression fractures.   MRI LUMBAR SPINE WITHOUT AND WITH CONTRAST  Technique:  Multiplanar and multiecho pulse sequences of the lumbar spine were obtained without and with intravenous contrast.  Contrast: 16mL MULTIHANCE GADOBENATE DIMEGLUMINE 529 MG/ML IV SOLN  Comparison: Radiographs dated 10/30/2012 and CT scans dated 06/09 and 07/26/2012 and 02/22/2012  Findings: Normal appearing conus tip at L1.  Normal paraspinal soft tissues.  There is an acute or subacute slight compression fracture of the superior endplate of L2 without protrusion of disc or bone material into the spinal canal.  This does not appear to be a pathologic fracture.  There is an old slight compression fracture of the superior endplate of L4. There is a small Schmorl's node in the inferior endplate of T12.  Z61-09 through L2-3: No significant abnormality of the discs. Minimal degenerative changes of the facet joints at L2-3.  L3-4:  Hypertrophy of the ligamenta flava and facets create moderate spinal stenosis without focal neural impingement.  The disc bulges into both neural foramina without neural impingement.  L4-5:  4 mm spondylolisthesis with a broad-based disc bulge with a small protrusion into the left neural foramen without neural impingement.  There is severe bilateral facet arthritis.  Previous right laminectomy.  No spinal stenosis.  L5 S1:  Slight bilateral facet arthritis.  Normal disc.  IMPRESSION:  1.  Benign-appearing slight compression fracture of the superior endplate of L2 with no neural impingement. 2.  Moderate spinal stenosis at L3-4 due to posterior element hypertrophy.   Original Report Authenticated By: Francene Boyers, M.D.    Assessment/Plan Acute L2 compression fracture after fall Discussed Kyphoplasty at this level. Explained procedure, risks, complications, use of sedation and anticipated recovery course. Labs reviewed, will check STAT coags Consent signed in chart Proceed today, insurance approved.  Brayton El PA-C 10/31/2012,  9:22 AM

## 2012-10-31 NOTE — Progress Notes (Signed)
Utilization review completed.  

## 2012-10-31 NOTE — Progress Notes (Signed)
Subjective: Pain control is improved with Fentanyl and Dilaudid.  Anticipates L2 kyphoplasty this afternoon.  Objective: Vital signs in last 24 hours: Temp:  [97.8 F (36.6 C)-98.6 F (37 C)] 98.1 F (36.7 C) (09/02 0540) Pulse Rate:  [78-115] 78 (09/02 0540) Resp:  [17-22] 18 (09/02 0540) BP: (102-154)/(63-90) 131/72 mmHg (09/02 0540) SpO2:  [90 %-97 %] 90 % (09/02 0540) Weight:  [78.926 kg (174 lb)] 78.926 kg (174 lb) (09/01 2300) Weight change:  Last BM Date: 10/30/12  CBG (last 3)  No results found for this basename: GLUCAP,  in the last 72 hours  Intake/Output from previous day: 09/01 0701 - 09/02 0700 In: -  Out: 1 [Urine:1] Intake/Output this shift:    General appearance: alert and no distress Eyes: no scleral icterus Throat: oropharynx moist without erythema Resp: clear to auscultation bilaterally Cardio: regular rate and rhythm GI: soft, non-tender; bowel sounds normal; no masses,  no organomegaly Extremities: no clubbing, cyanosis or edema   Lab Results:  Recent Labs  10/30/12 1745  NA 141  K 4.7  CL 101  CO2 31  GLUCOSE 121*  BUN 18  CREATININE 0.92  CALCIUM 9.6    Recent Labs  10/30/12 1745  WBC 9.7  NEUTROABS 7.0  HGB 14.2  HCT 43.5  MCV 95.8  PLT 232   Lab Results  Component Value Date   INR 0.99 10/31/2012   INR 0.94 12/18/2010   INR 1.07 04/01/2010    Studies/Results: Dg Cervical Spine Complete  10/30/2012   *RADIOLOGY REPORT*  Clinical Data: Fall with neck pain  CERVICAL SPINE - COMPLETE 4+ VIEW  Comparison: None  Findings: Normal alignment is noted. There is no evidence of fracture, subluxation or prevertebral soft tissue swelling. Mild multilevel degenerative disc disease, spondylosis and facet arthropathy noted throughout the cervical spine. Mild right bony foraminal narrowing at C3-C4 noted. No focal bony lesions are present.  IMPRESSION: No static evidence of acute injury to the cervical spine.  Mild multilevel degenerative  changes.   Original Report Authenticated By: Harmon Pier, M.D.   Dg Lumbar Spine Complete  10/30/2012   CLINICAL DATA:  78 year old female status post fall with persistent low back pain. L4 fracture diagnosed in May.  EXAM: LUMBAR SPINE - COMPLETE 4+ VIEW  COMPARISON:  Abdomen CT 08/07/2012. Lumbar spine series 16109.  FINDINGS: Osteopenia. Mildly increased compression of the L4 superior endplate which now appears more sclerotic. L3 and L5 appear intact. The L2 superior endplate now is compressed, new since 07/25/2012. L1 in the visible lower thoracic levels appear stable. Trace anterolisthesis of L4 on L5 is stable. Extensive aortoiliac calcified atherosclerosis. Grossly intact sacral a ala. Left hip ORIF.  IMPRESSION: 1. Acute or subacute mild L2 superior endplate fracture/compression fracture, new since 08/07/2012.  2. Stable to mildly progressed L4 compression fracture which was present on 07/25/2012.  3. Stable lumbar spine otherwise. Osteopenia.   Electronically Signed   By: Augusto Gamble   On: 10/30/2012 18:55   [name] Contrast  10/31/2012   *RADIOLOGY REPORT*  Clinical Data: Severe back pain.  Compression fractures.  MRI LUMBAR SPINE WITHOUT AND WITH CONTRAST  Technique:  Multiplanar and multiecho pulse sequences of the lumbar spine were obtained without and with intravenous contrast.  Contrast: 16mL MULTIHANCE GADOBENATE DIMEGLUMINE 529 MG/ML IV SOLN  Comparison: Radiographs dated 10/30/2012 and CT scans dated 06/09 and 07/26/2012 and 02/22/2012  Findings: Normal appearing conus tip at L1.  Normal paraspinal soft tissues.  There  is an acute or subacute slight compression fracture of the superior endplate of L2 without protrusion of disc or bone material into the spinal canal.  This does not appear to be a pathologic fracture.  There is an old slight compression fracture of the superior endplate of L4. There is a small Schmorl's node in the inferior endplate of T12.  [id number] through L2-3: No  significant abnormality of the discs. Minimal degenerative changes of the facet joints at L2-3.  L3-4:  Hypertrophy of the ligamenta flava and facets create moderate spinal stenosis without focal neural impingement.  The disc bulges into both neural foramina without neural impingement.  L4-5:  4 mm spondylolisthesis with a broad-based disc bulge with a small protrusion into the left neural foramen without neural impingement.  There is severe bilateral facet arthritis.  Previous right laminectomy.  No spinal stenosis.  L5 S1:  Slight bilateral facet arthritis.  Normal disc.  IMPRESSION:  1.  Benign-appearing slight compression fracture of the superior endplate of L2 with no neural impingement. 2.  Moderate spinal stenosis at L3-4 due to posterior element hypertrophy.   Original Report Authenticated By: Francene Boyers, M.D.     Medications: Scheduled: . calcitonin (salmon)  1 spray Alternating Nares Daily  . colestipol  1 g Oral Daily  . docusate sodium  100 mg Oral BID  . DULoxetine  60 mg Oral Daily  . enoxaparin (LOVENOX) injection  40 mg Subcutaneous Q24H  . ergocalciferol  50,000 Units Oral Weekly  . ezetimibe-simvastatin  1 tablet Oral QHS  . fentaNYL  25 mcg Transdermal Q72H  . gabapentin  300 mg Oral BID  . hyoscyamine  0.375 mg Oral Q12H  . levETIRAcetam  500 mg Oral BID  . levothyroxine  88 mcg Oral q morning - 10a  . metroNIDAZOLE   Topical Daily  . pantoprazole  40 mg Oral Daily  . predniSONE  5 mg Oral Q breakfast  . vancomycin  1,000 mg Intravenous 30 min Pre-Op   Continuous: . sodium chloride 100 mL/hr at 10/30/12 2009    Assessment/Plan: Principal Problem: 1. Vertebral compression fracture- plan for L2 kyphoplasty today for acute pain.  More chronic back pain appears to be less amenable to intervention based on MRI as fracture has healed.  Will plan to follow-up with Dr. Danielle Dess as scheduled on 9/17 for this pain related to spinal stenosis.  Continue Fentanyl patch and  transition to po Percocet as able after procedure. Active Problems: 2. Adrenal insufficiency- continue Prednisone.  No indication for stress dosed steroids at this time. 3. Osteoporosis- Miacalcin nasal spray.  Plan for outpatient Reclast. 4. Irritable bowel syndrome- no diarrhea since admission.  Monitor for constipation with narcotics. 5. Peripheral neuropathy- continue neurontin and cymbalta. 6. Unsteady gait with Recurrent falls- PT/OT for fall risk prevention. 7. Disposition- anticipate discharge in 1-2 days if pain controlled after kyphoplasty with Fentanyl patch and po meds.   LOS: 1 day   Khari Lett,W DOUGLAS 10/31/2012, 10:45 AM

## 2012-10-31 NOTE — Progress Notes (Signed)
OT Cancellation Note  Patient Details Name: [name] MRN: [id number] DOB: September 24, 1934   Cancelled Treatment:     Pt is scheduled for KP today.   Lennox Laity [phone] 10/31/2012, 12:00 PM

## 2012-10-31 NOTE — Progress Notes (Signed)
Clinical Social Work Department BRIEF PSYCHOSOCIAL ASSESSMENT 10/31/2012  Patient:  [name], [name]     Account Number:  [id number]     Admit date:  10/30/2012  Clinical Social Worker:  Candie Chroman  Date/Time:  10/31/2012 12:59 PM  Referred by:  CSW  Date Referred:  10/31/2012 Referred for  SNF Placement   Other Referral:   Interview type:  Patient Other interview type:    PSYCHOSOCIAL DATA Living Status:  HUSBAND Admitted from facility:   Level of care:   Primary support name:  [facility] Primary support relationship to patient:  SPOUSE Degree of support available:   supportive    CURRENT CONCERNS Current Concerns  Post-Acute Placement   Other Concerns:    SOCIAL WORK ASSESSMENT / PLAN Pt is a 78 yr old female living at home prior to hospitalization. CSW met with pt / spouse to assist with d/c planning. Pt is scheduled for surgery this afternoon. She hopes to return home with [facility] services following hospital d/c but will consider [facility] if necessary. CSW will review PT recommendations and assist with d/c planning if SNF is needed.   Assessment/plan status:  Psychosocial Support/Ongoing Assessment of Needs Other assessment/ plan:   Home vs SNF   Information/referral to community resources:   SNF list provided.    PATIENT'S/FAMILY'S RESPONSE TO PLAN OF CARE: Pt is hopeful that she will be able to return home and continue with PT from home.   Cori Razor LCSW [phone]

## 2012-11-01 ENCOUNTER — Inpatient Hospital Stay (HOSPITAL_COMMUNITY): Payer: Medicare Other

## 2012-11-01 MED ORDER — MIDAZOLAM HCL 2 MG/2ML IJ SOLN
INTRAMUSCULAR | Status: AC | PRN
  Administered 2012-11-01 (×2): 1 mg via INTRAVENOUS

## 2012-11-01 MED ORDER — MIDAZOLAM HCL 2 MG/2ML IJ SOLN
INTRAMUSCULAR | Status: AC
  Filled 2012-11-01: qty 4

## 2012-11-01 MED ORDER — LIDOCAINE HCL (PF) 1 % IJ SOLN
INTRAMUSCULAR | Status: AC
  Filled 2012-11-01: qty 30

## 2012-11-01 MED ORDER — LIDOCAINE 5 % EX PTCH
1.0000 | MEDICATED_PATCH | Freq: Every day | CUTANEOUS | Status: DC
  Administered 2012-11-01 – 2012-11-03 (×3): 1 via TRANSDERMAL
  Filled 2012-11-01 (×3): qty 1

## 2012-11-01 MED ORDER — FENTANYL CITRATE 0.05 MG/ML IJ SOLN
INTRAMUSCULAR | Status: AC | PRN
  Administered 2012-11-01 (×2): 50 ug via INTRAVENOUS

## 2012-11-01 MED ORDER — FENTANYL CITRATE 0.05 MG/ML IJ SOLN
INTRAMUSCULAR | Status: AC
  Filled 2012-11-01: qty 4

## 2012-11-01 NOTE — Procedures (Signed)
L2 kyphoplasty No complication No blood loss. See complete dictation in [facility].

## 2012-11-01 NOTE — Progress Notes (Signed)
Subjective: Aborted L2 kyphoplasty 10/31/12 b/c pt became unresponsive c cyanosis during anesthesia induction. Treated c O2, Narcan and IV fluids. Pt became responsive.  They are going to try again today.  Her sternum is sore and she has a blister due to the sternal rubs.  She clearly is uncomfortable.  Her R arm Abrasions and lacerations are also an issue.  Pain control with Fentanyl and Dilaudid.   Objective: Vital signs in last 24 hours: Temp:  [98 F (36.7 C)-98.7 F (37.1 C)] 98.4 F (36.9 C) (09/03 0538) Pulse Rate:  [86-96] 95 (09/03 0538) Resp:  [12-22] 22 (09/02 1558) BP: (102-208)/(66-111) 102/68 mmHg (09/03 0538) SpO2:  [92 %-99 %] 94 % (09/03 0538) Weight change:  Last BM Date: 10/30/12  CBG (last 3)  No results found for this basename: GLUCAP,  in the last 72 hours  Intake/Output from previous day: 09/02 0701 - 09/03 0700 In: 3385 [I.V.:3385] Out: 1000 [Urine:1000] Intake/Output this shift:    General appearance: alert and no distress, clearly uncomfortable. Eyes: no scleral icterus Throat: oropharynx moist without erythema Resp: clear to auscultation bilaterally Cardio: regular rate and rhythm GI: soft, non-tender; bowel sounds normal; no masses,  no organomegaly Extremities: no clubbing, cyanosis or edema Back Tender. Sternum - Tender blister. Arms bruised and L arm Lac/Abraision.   Lab Results:  Recent Labs  10/30/12 1745  NA 141  K 4.7  CL 101  CO2 31  GLUCOSE 121*  BUN 18  CREATININE 0.92  CALCIUM 9.6    Recent Labs  10/30/12 1745  WBC 9.7  NEUTROABS 7.0  HGB 14.2  HCT 43.5  MCV 95.8  PLT 232   Lab Results  Component Value Date   INR 0.99 10/31/2012   INR 0.94 12/18/2010   INR 1.07 04/01/2010    Studies/Results: Dg Cervical Spine Complete  10/30/2012   *RADIOLOGY REPORT*  Clinical Data: Fall with neck pain  CERVICAL SPINE - COMPLETE 4+ VIEW  Comparison: None  Findings: Normal alignment is noted. There is no evidence of fracture,  subluxation or prevertebral soft tissue swelling. Mild multilevel degenerative disc disease, spondylosis and facet arthropathy noted throughout the cervical spine. Mild right bony foraminal narrowing at C3-C4 noted. No focal bony lesions are present.  IMPRESSION: No static evidence of acute injury to the cervical spine.  Mild multilevel degenerative changes.   Original Report Authenticated By: Harmon Pier, M.D.   Dg Lumbar Spine Complete  10/30/2012   CLINICAL DATA:  78 year old female status post fall with persistent low back pain. L4 fracture diagnosed in May.  EXAM: LUMBAR SPINE - COMPLETE 4+ VIEW  COMPARISON:  Abdomen CT 08/07/2012. Lumbar spine series 16109.  FINDINGS: Osteopenia. Mildly increased compression of the L4 superior endplate which now appears more sclerotic. L3 and L5 appear intact. The L2 superior endplate now is compressed, new since 07/25/2012. L1 in the visible lower thoracic levels appear stable. Trace anterolisthesis of L4 on L5 is stable. Extensive aortoiliac calcified atherosclerosis. Grossly intact sacral a ala. Left hip ORIF.  IMPRESSION: 1. Acute or subacute mild L2 superior endplate fracture/compression fracture, new since 08/07/2012.  2. Stable to mildly progressed L4 compression fracture which was present on 07/25/2012.  3. Stable lumbar spine otherwise. Osteopenia.   Electronically Signed   By: Augusto Gamble   On: 10/30/2012 18:55   [name] Contrast  10/31/2012   *RADIOLOGY REPORT*  Clinical Data: Severe back pain.  Compression fractures.  MRI LUMBAR SPINE WITHOUT AND WITH CONTRAST  Technique:  Multiplanar and multiecho pulse sequences of the lumbar spine were obtained without and with intravenous contrast.  Contrast: 16mL MULTIHANCE GADOBENATE DIMEGLUMINE 529 MG/ML IV SOLN  Comparison: Radiographs dated 10/30/2012 and CT scans dated 06/09 and 07/26/2012 and 02/22/2012  Findings: Normal appearing conus tip at L1.  Normal paraspinal soft tissues.  There is an acute or  subacute slight compression fracture of the superior endplate of L2 without protrusion of disc or bone material into the spinal canal.  This does not appear to be a pathologic fracture.  There is an old slight compression fracture of the superior endplate of L4. There is a small Schmorl's node in the inferior endplate of T12.  [id number] through L2-3: No significant abnormality of the discs. Minimal degenerative changes of the facet joints at L2-3.  L3-4:  Hypertrophy of the ligamenta flava and facets create moderate spinal stenosis without focal neural impingement.  The disc bulges into both neural foramina without neural impingement.  L4-5:  4 mm spondylolisthesis with a broad-based disc bulge with a small protrusion into the left neural foramen without neural impingement.  There is severe bilateral facet arthritis.  Previous right laminectomy.  No spinal stenosis.  L5 S1:  Slight bilateral facet arthritis.  Normal disc.  IMPRESSION:  1.  Benign-appearing slight compression fracture of the superior endplate of L2 with no neural impingement. 2.  Moderate spinal stenosis at L3-4 due to posterior element hypertrophy.   Original Report Authenticated By: Francene Boyers, M.D.     Medications: Scheduled: . calcitonin (salmon)  1 spray Alternating Nares Daily  . colestipol  1 g Oral Daily  . docusate sodium  100 mg Oral BID  . DULoxetine  60 mg Oral Daily  . enoxaparin (LOVENOX) injection  40 mg Subcutaneous Q24H  . ergocalciferol  50,000 Units Oral Weekly  . ezetimibe-simvastatin  1 tablet Oral QHS  . fentaNYL  25 mcg Transdermal Q72H  . gabapentin  300 mg Oral BID  . hyoscyamine  0.375 mg Oral Q12H  . levETIRAcetam  500 mg Oral BID  . levothyroxine  88 mcg Oral q morning - 10a  . metroNIDAZOLE   Topical Daily  . pantoprazole  40 mg Oral Daily  . predniSONE  5 mg Oral Q breakfast  . vancomycin  1,000 mg Intravenous 30 min Pre-Op   Continuous: . sodium chloride 100 mL/hr at 10/31/12 1825     Assessment/Plan: Principal Problem: 1. Vertebral L2 Osteoporotic compression fracture after a fall - Aborted L2 kyphoplasty 9/2.  Continue to attempt pain control and try again for VP today. More chronic back pain appears to be less amenable to intervention based on MRI as fracture has healed.  Will plan to follow-up with Dr. Danielle Dess as scheduled on 9/17 for this pain related to spinal stenosis.  Continue Fentanyl patch and transition to po Percocet as able after procedure.  She has a Shell to wear when up Active Problems: 2. Adrenal insufficiency- continue Prednisone.  No indication for stress dosed steroids at this time. 3. Osteoporosis- Miacalcin nasal spray.  Plan for outpatient Reclast. 4. Irritable bowel syndrome- no diarrhea since admission.  Monitor for constipation with narcotics. 5. Peripheral neuropathy- continue neurontin and cymbalta. 6. Unsteady gait with Recurrent falls- PT/OT for fall risk prevention.  They were not able to work with patient yesterday 7. Disposition- anticipate discharge in 1-2 days if pain controlled after kyphoplasty with Fentanyl patch and po meds. 8 - Sternal Blister and tender - will cover with Lidoderm  patch.   LOS: 2 days   Pesach Frisch M 11/01/2012, 7:27 AM

## 2012-11-01 NOTE — Progress Notes (Signed)
PT Cancellation Note  Patient Details Name: KIMMI ACOCELLA MRN: [id number] DOB: 10-22-1934   Cancelled Treatment:    Reason Eval/Treat Not Completed: Medical issues which prohibited therapy (for KP)   Rada Hay 11/01/2012, 2:37 PM Blanchard Kelch PT [phone]

## 2012-11-02 NOTE — Progress Notes (Addendum)
Subjective: Successful L2 kyphoplasty 11/01/12.  Pain reports drastic improvement in pain in the L2 region Continued Chronic pain in her L4 region Only required 2 doses of oral narcotic pain control overnight This is her first day post op   Objective: Vital signs in last 24 hours: Temp:  [98.3 F (36.8 C)-99.2 F (37.3 C)] 98.3 F (36.8 C) (09/04 0455) Pulse Rate:  [82-91] 89 (09/03 2100) Resp:  [8-18] 18 (09/04 0455) BP: (121-166)/(63-104) 121/77 mmHg (09/04 0455) SpO2:  [89 %-99 %] 98 % (09/04 0455) Weight change:  Last BM Date: 10/30/12  CBG (last 3)  No results found for this basename: GLUCAP,  in the last 72 hours  Intake/Output from previous day: 09/03 0701 - 09/04 0700 In: 0  Out: 1100 [Urine:1100] Intake/Output this shift:    General appearance: alert and no distress, clearly uncomfortable. Eyes: no scleral icterus Throat: oropharynx moist without erythema Resp: clear to auscultation bilaterally Cardio: regular rate and rhythm GI: soft, non-tender; bowel sounds normal; no masses,  no organomegaly Extremities: no clubbing, cyanosis or edema Back Tender. Sternum - Tender blister, resolving. Arms bruised and L arm Lac/Abraision.   Lab Results:  Recent Labs  10/30/12 1745  NA 141  K 4.7  CL 101  CO2 31  GLUCOSE 121*  BUN 18  CREATININE 0.92  CALCIUM 9.6    Recent Labs  10/30/12 1745  WBC 9.7  NEUTROABS 7.0  HGB 14.2  HCT 43.5  MCV 95.8  PLT 232   Lab Results  Component Value Date   INR 0.99 10/31/2012   INR 0.94 12/18/2010   INR 1.07 04/01/2010    Studies/Results: [name] Contrast  10/31/2012   *RADIOLOGY REPORT*  Clinical Data: Severe back pain.  Compression fractures.  MRI LUMBAR SPINE WITHOUT AND WITH CONTRAST  Technique:  Multiplanar and multiecho pulse sequences of the lumbar spine were obtained without and with intravenous contrast.  Contrast: 16mL MULTIHANCE GADOBENATE DIMEGLUMINE 529 MG/ML IV SOLN  Comparison: Radiographs  dated 10/30/2012 and CT scans dated 06/09 and 07/26/2012 and 02/22/2012  Findings: Normal appearing conus tip at L1.  Normal paraspinal soft tissues.  There is an acute or subacute slight compression fracture of the superior endplate of L2 without protrusion of disc or bone material into the spinal canal.  This does not appear to be a pathologic fracture.  There is an old slight compression fracture of the superior endplate of L4. There is a small Schmorl's node in the inferior endplate of T12.  U98-11 through L2-3: No significant abnormality of the discs. Minimal degenerative changes of the facet joints at L2-3.  L3-4:  Hypertrophy of the ligamenta flava and facets create moderate spinal stenosis without focal neural impingement.  The disc bulges into both neural foramina without neural impingement.  L4-5:  4 mm spondylolisthesis with a broad-based disc bulge with a small protrusion into the left neural foramen without neural impingement.  There is severe bilateral facet arthritis.  Previous right laminectomy.  No spinal stenosis.  L5 S1:  Slight bilateral facet arthritis.  Normal disc.  IMPRESSION:  1.  Benign-appearing slight compression fracture of the superior endplate of L2 with no neural impingement. 2.  Moderate spinal stenosis at L3-4 due to posterior element hypertrophy.   Original Report Authenticated By: Francene Boyers, M.D.   Ir Kyphoplasty Or Sacroplasty  11/01/2012   *RADIOLOGY REPORT*  Clinical Data:  Painful L2 osteoporotic compression fracture deformity.  KYPHOPLASTY AT LUMBAR L2: Technique and findings:  The procedure, risks (including but not limited to bleeding, infection, organ damage), benefits, and alternatives were explained to the patient.  Questions regarding the procedure were encouraged and answered.  The patient understands and consents to the procedure. The patient was placed prone on the fluoroscopic table.  The skin overlying the upper lumbar region was then prepped and draped in  the usual sterile fashion.  Maximal barrier sterile technique was utilized including caps, mask, sterile gowns, sterile gloves, sterile drape, hand hygiene and skin antiseptic.  Intravenous Fentanyl and Versed were administered as conscious sedation during continuous cardiorespiratory monitoring by the radiology RN, with a total moderate sedation time of30 minutes.  As antibiotic prophylaxis, vancomycin 1 gram was ordered pre- procedure and administered intravenously within one hour of incision.  The right pedicle at L2 was then infiltrated with 1% lidocaine followed by the advancement of a Kyphon trocar needle through the right pedicle into the posterior one-third. The trocar was removed and the osteo drill was advanced to the anterior third of the vertebral body.  The osteo drill   was retracted.  Through the working cannula,  a Kyphon inflatable bone tamp 15 x 3 was advanced and positioned with the distal marker 5 mm from the anterior aspect of the cortex.  Crossing of the midline was not seen on the AP projection.  At this time, the balloon was expanded using contrast via a Kyphon inflation syringe device via micro tubing. Inflation  continued until there was near apposition across the midline and with the superior and the inferior end plates. At this time, methylmethacrylate mixture was reconstituted  in the Kyphon bone mixing device system.  This was then loaded into the delivery mechanism, attached to Kyphon bone fillers. The balloons were deflated and removed followed by the instillation of methylmethacrylate mixture  with excellent filling in the AP and lateral projections.  No extravasation was noted in the disk spaces or posteriorly into the spinal canal.  No epidural venous contamination was seen. Minimal paraspinal venous filling. The patient tolerated the procedure well.  There were no acute complications.  The working cannulae and the bone filler were then retrieved and removed.  IMPRESSION: 1.   Status post vertebral body augmentation using balloon kyphoplasty at lumbar L2  as described without event. 2. Per CMS PQRS reporting requirements (PQRS Measure 24): Given the patient's age of greater than 50 and the fracture site (hip, distal radius, or spine), the patient should be tested for osteoporosis using DXA, and the appropriate treatment considered based on the DXA results.   Original Report Authenticated By: D. Andria Rhein, MD     Medications: Scheduled: . calcitonin (salmon)  1 spray Alternating Nares Daily  . colestipol  1 g Oral Daily  . docusate sodium  100 mg Oral BID  . DULoxetine  60 mg Oral Daily  . enoxaparin (LOVENOX) injection  40 mg Subcutaneous Q24H  . ergocalciferol  50,000 Units Oral Weekly  . ezetimibe-simvastatin  1 tablet Oral QHS  . fentaNYL  25 mcg Transdermal Q72H  . gabapentin  300 mg Oral BID  . hyoscyamine  0.375 mg Oral Q12H  . levETIRAcetam  500 mg Oral BID  . levothyroxine  88 mcg Oral q morning - 10a  . lidocaine  1 patch Transdermal Daily  . metroNIDAZOLE   Topical Daily  . pantoprazole  40 mg Oral Daily  . predniSONE  5 mg Oral Q breakfast   Continuous: . sodium chloride 100 mL/hr at 11/01/12 2131  Assessment/Plan: Principal Problem: # Vertebral L2 Osteoporotic compression fracture after a fall - s/p L2 kyphoplasty 9/3 w/ moderate success in symptom control  - she is on fentanyl patch, plus her home percocet for pain control. Required 3 doses of percocet in 24 hours  - wants to return home. Will have PT evaluate today   # chronic back pain - Will plan to follow-up with Dr. Danielle Dess as scheduled on 9/17 for this pain related to spinal stenosis. We have transitioned to po Percocet + fentanyl patch. She has a Shell to wear when up Active Problems: 2. Adrenal insufficiency- continue Prednisone.  No indication for stress dosed steroids at this time. 3. Osteoporosis- Miacalcin nasal spray.  Plan for outpatient Reclast. 4. Irritable bowel  syndrome- no diarrhea since admission.  Monitor for constipation with narcotics. 5. Peripheral neuropathy- continue neurontin and cymbalta. 6. Unsteady gait with Recurrent falls- PT/OT for fall risk prevention.  They were not able to work with patient yesterday 7. Disposition- anticipate discharge in 1-2 days to home vs rehab depending on PT analysis today. She has appt w/ Dr Danielle Dess on 9/17.  8 - Sternal Blister - improving    LOS: 3 days   Corianne Buccellato 11/02/2012, 7:28 AM

## 2012-11-02 NOTE — Discharge Summary (Signed)
Physician Discharge Summary    [name] [facility]  MR#: [id number]  DOB:1934/08/26  Date of Admission: 10/30/2012 Date of Discharge: 11/02/2012  Attending Physician:Renold Kozar  Patient's YNW:[name] Kelsey Diener, MD  Consults:IR   Discharge Diagnoses: Principal Problem:   Vertebral compression fracture Active Problems:   Adrenal insufficiency   Osteoporosis   Irritable bowel syndrome   Peripheral neuropathy   Unsteady gait   Recurrent falls   Discharge Medications:   Medication List    ASK your doctor about these medications       DEXILANT 60 MG capsule  Generic drug:  dexlansoprazole  Take 60 mg by mouth 2 (two) times daily.     DULoxetine 60 MG capsule  Commonly known as:  CYMBALTA  Take 60 mg by mouth daily.     ergocalciferol 50000 UNITS capsule  Commonly known as:  VITAMIN D2  Take 50,000 Units by mouth once a week. tuesday     ezetimibe-simvastatin 10-80 MG per tablet  Commonly known as:  VYTORIN  Take 1 tablet by mouth at bedtime.     gabapentin 100 MG capsule  Commonly known as:  NEURONTIN  Take 300 mg by mouth 2 (two) times daily.     hyoscyamine 0.375 MG 12 hr tablet  Commonly known as:  LEVBID  Take 1 tablet by mouth every 12 (twelve) hours.     levETIRAcetam 500 MG tablet  Commonly known as:  KEPPRA  Take 1 tablet (500 mg total) by mouth 2 (two) times daily.     levothyroxine 88 MCG tablet  Commonly known as:  SYNTHROID, LEVOTHROID  Take 88 mcg by mouth every morning.     LORazepam 0.5 MG tablet  Commonly known as:  ATIVAN  Take 1 tablet (0.5 mg total) by mouth every 8 (eight) hours as needed. Anxiety     metroNIDAZOLE 1 % gel  Commonly known as:  METROGEL  Apply 1 application topically daily. Applied to face for rosacea.     MICRONIZED COLESTIPOL HCL 1 G tablet  Generic drug:  colestipol  Take 1 tablet by mouth daily.     oxyCODONE-acetaminophen 5-325 MG per tablet  Commonly known as:  PERCOCET  Take 1 tablet by mouth every 4  (four) hours as needed for pain.     predniSONE 5 MG tablet  Commonly known as:  DELTASONE  Take 1 tablet (5 mg total) by mouth daily with breakfast.     UCERIS 9 MG Tb24  Generic drug:  Budesonide  Take 1 capsule by mouth every 12 (twelve) hours.        Hospital Procedures: Dg Cervical Spine Complete  10/30/2012   *RADIOLOGY REPORT*  Clinical Data: Fall with neck pain  CERVICAL SPINE - COMPLETE 4+ VIEW  Comparison: None  Findings: Normal alignment is noted. There is no evidence of fracture, subluxation or prevertebral soft tissue swelling. Mild multilevel degenerative disc disease, spondylosis and facet arthropathy noted throughout the cervical spine. Mild right bony foraminal narrowing at C3-C4 noted. No focal bony lesions are present.  IMPRESSION: No static evidence of acute injury to the cervical spine.  Mild multilevel degenerative changes.   Original Report Authenticated By: Harmon Pier, M.D.   Dg Lumbar Spine Complete  10/30/2012   CLINICAL DATA:  78 year old female status post fall with persistent low back pain. L4 fracture diagnosed in May.  EXAM: LUMBAR SPINE - COMPLETE 4+ VIEW  COMPARISON:  Abdomen CT 08/07/2012. Lumbar spine series 84696.  FINDINGS: Osteopenia. Mildly increased compression of the  L4 superior endplate which now appears more sclerotic. L3 and L5 appear intact. The L2 superior endplate now is compressed, new since 07/25/2012. L1 in the visible lower thoracic levels appear stable. Trace anterolisthesis of L4 on L5 is stable. Extensive aortoiliac calcified atherosclerosis. Grossly intact sacral a ala. Left hip ORIF.  IMPRESSION: 1. Acute or subacute mild L2 superior endplate fracture/compression fracture, new since 08/07/2012.  2. Stable to mildly progressed L4 compression fracture which was present on 07/25/2012.  3. Stable lumbar spine otherwise. Osteopenia.   Electronically Signed   By: Augusto Gamble   On: 10/30/2012 18:55   [name] Contrast  10/31/2012    *RADIOLOGY REPORT*  Clinical Data: Severe back pain.  Compression fractures.  MRI LUMBAR SPINE WITHOUT AND WITH CONTRAST  Technique:  Multiplanar and multiecho pulse sequences of the lumbar spine were obtained without and with intravenous contrast.  Contrast: 16mL MULTIHANCE GADOBENATE DIMEGLUMINE 529 MG/ML IV SOLN  Comparison: Radiographs dated 10/30/2012 and CT scans dated 06/09 and 07/26/2012 and 02/22/2012  Findings: Normal appearing conus tip at L1.  Normal paraspinal soft tissues.  There is an acute or subacute slight compression fracture of the superior endplate of L2 without protrusion of disc or bone material into the spinal canal.  This does not appear to be a pathologic fracture.  There is an old slight compression fracture of the superior endplate of L4. There is a small Schmorl's node in the inferior endplate of T12.  [id number] through L2-3: No significant abnormality of the discs. Minimal degenerative changes of the facet joints at L2-3.  L3-4:  Hypertrophy of the ligamenta flava and facets create moderate spinal stenosis without focal neural impingement.  The disc bulges into both neural foramina without neural impingement.  L4-5:  4 mm spondylolisthesis with a broad-based disc bulge with a small protrusion into the left neural foramen without neural impingement.  There is severe bilateral facet arthritis.  Previous right laminectomy.  No spinal stenosis.  L5 S1:  Slight bilateral facet arthritis.  Normal disc.  IMPRESSION:  1.  Benign-appearing slight compression fracture of the superior endplate of L2 with no neural impingement. 2.  Moderate spinal stenosis at L3-4 due to posterior element hypertrophy.   Original Report Authenticated By: Francene Boyers, M.D.   Ir Kyphoplasty Or Sacroplasty  11/01/2012   *RADIOLOGY REPORT*  Clinical Data:  Painful L2 osteoporotic compression fracture deformity.  KYPHOPLASTY AT LUMBAR L2: Technique and findings:  The procedure, risks (including but not limited to  bleeding, infection, organ damage), benefits, and alternatives were explained to the patient.  Questions regarding the procedure were encouraged and answered.  The patient understands and consents to the procedure. The patient was placed prone on the fluoroscopic table.  The skin overlying the upper lumbar region was then prepped and draped in the usual sterile fashion.  Maximal barrier sterile technique was utilized including caps, mask, sterile gowns, sterile gloves, sterile drape, hand hygiene and skin antiseptic.  Intravenous Fentanyl and Versed were administered as conscious sedation during continuous cardiorespiratory monitoring by the radiology RN, with a total moderate sedation time of30 minutes.  As antibiotic prophylaxis, vancomycin 1 gram was ordered pre- procedure and administered intravenously within one hour of incision.  The right pedicle at L2 was then infiltrated with 1% lidocaine followed by the advancement of a Kyphon trocar needle through the right pedicle into the posterior one-third. The trocar was removed and the osteo drill was advanced to the anterior third of the vertebral  body.  The osteo drill   was retracted.  Through the working cannula,  a Kyphon inflatable bone tamp 15 x 3 was advanced and positioned with the distal marker 5 mm from the anterior aspect of the cortex.  Crossing of the midline was not seen on the AP projection.  At this time, the balloon was expanded using contrast via a Kyphon inflation syringe device via micro tubing. Inflation  continued until there was near apposition across the midline and with the superior and the inferior end plates. At this time, methylmethacrylate mixture was reconstituted  in the Kyphon bone mixing device system.  This was then loaded into the delivery mechanism, attached to Kyphon bone fillers. The balloons were deflated and removed followed by the instillation of methylmethacrylate mixture  with excellent filling in the AP and lateral  projections.  No extravasation was noted in the disk spaces or posteriorly into the spinal canal.  No epidural venous contamination was seen. Minimal paraspinal venous filling. The patient tolerated the procedure well.  There were no acute complications.  The working cannulae and the bone filler were then retrieved and removed.  IMPRESSION: 1.  Status post vertebral body augmentation using balloon kyphoplasty at lumbar L2  as described without event. 2. Per CMS PQRS reporting requirements (PQRS Measure 24): Given the patient's age of greater than 50 and the fracture site (hip, distal radius, or spine), the patient should be tested for osteoporosis using DXA, and the appropriate treatment considered based on the DXA results.   Original Report Authenticated By: D. Andria Rhein, MD    History of Present Illness: Mrs. Nolde is a 78 year old white female with severe osteoporosis with prior L4 compression fracture (5/14), adrenal insufficiency on chronic steroids, and prior Breast Cancer s/p chemo/XRT who presented to the emergency department with intractable back pain following a fall 5 days ago. The patient has been experiencing severe low back pain since a fall in 5/14 which resulted in an L4 compression fracture. She's been wearing a TLSO brace since then anticipating in physical therapy. Her pain has been persistent requiring Percocet every 4 hours. She has declined OxyContin for basal pain control. She's been awaiting a visit with Dr. Danielle Dess, neurosurgery to discuss further management of this. She's been using a walker and has to walk stooped over. 5 days ago she tripped over the threshold going to her house with a walker resulting in a fall. Shoulder thereafter, she tripped over a flower pot and fell even harder. Since that time her pain has been excruciating and uncontrolled with Percocet every 4 hours. At the behest of her daughter she came to the emergency department this evening where she was found have a  new L2 compression fracture. Her pain is minimally controlled with Tylenol and and fentanyl the emergency department and she will require admission for pain management. She denies any radiation of pain down her legs. No focal weakness. 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 This resolved w/ narcan and sternal rub. Pt returned to IR suite for repeat attempt on 9/3 and this attempt was successful w/ less sedation. After procedure pain improved drastically and she was able to control her pain on her prior dosing of percocet that she was taking before this  incident. She was seen by PT and deemed stable for d/c home w/ PT services. She will have family at home after discharge.   Sternal hematoma - suffered from this during sternal rub mentioned above. This healed well w/ minimal pain prior to discharge. No further plans needed. No signs of infeciton    chronic back pain - Will plan to follow-up with Dr. Danielle Dess as scheduled on 9/17 for this pain related to spinal stenosis. We have transitioned to po Percocet. She has a Shell to wear when up  Adrenal insufficiency- resumed on home Prednisone. No indication for stress dosed steroids at this time.    Osteoporosis- Miacalcin nasal spray. Plan for outpatient Reclast. This should be discussed at her f/u visit w/ Dr [name]   Irritable bowel syndrome- remained controlled    Peripheral neuropathy- continued neurontin and cymbalta.      Day of Discharge Exam BP 145/69  Pulse 88  Temp(Src) 98.3 F (36.8 C) (Oral)  Resp 16  Ht 5\' 7"  (1.702 m)  Wt 174 lb (78.926 kg)  BMI 27.25 kg/m2  SpO2 98%  Physical Exam: General appearance: alert and no distress, clearly uncomfortable.  Eyes: no scleral icterus  Throat:  oropharynx moist without erythema  Resp: clear to auscultation bilaterally  Cardio: regular rate and rhythm  GI: soft, non-tender; bowel sounds normal; no masses, no organomegaly  Extremities: no clubbing, cyanosis or edema  Back Tender. Sternum - Tender blister, resolving.  Arms bruised and L arm Lac/Abraision.   Discharge Labs:  Recent Labs  10/30/12 1745  NA 141  K 4.7  CL 101  CO2 31  GLUCOSE 121*  BUN 18  CREATININE 0.92  CALCIUM 9.6   No results found for this basename: AST, ALT, ALKPHOS, BILITOT, PROT, ALBUMIN,  in the last 72 hours  Recent Labs  10/30/12 1745  WBC 9.7  NEUTROABS 7.0  HGB 14.2  HCT 43.5  MCV 95.8  PLT 232   Lab Results  Component Value Date   INR 0.99 10/31/2012   INR 0.94 12/18/2010   INR 1.07 04/01/2010   No results found for this basename: CKTOTAL, CKMB, CKMBINDEX, TROPONINI,  in the last 72 hours No results found for this basename: TSH, T4TOTAL, FREET3, T3FREE, THYROIDAB,  in the last 72 hours No results found for this basename: VITAMINB12, FOLATE, FERRITIN, TIBC, IRON, RETICCTPCT,  in the last 72 hours  Discharge instructions:  Future Appointments Provider Department Dept Phone   12/05/2012 2:30 PM Levert Feinstein, MD [facility] [phone]   01/04/2013 1:30 PM Windell [facility] [phone]   01/04/2013 2:00 PM Lowella Dell, MD [facility] MEDICAL ONCOLOGY [phone]     06-Home-Health Care Svc   Disposition: home w/ HHPT   Follow-up Appts: Follow-up with Dr. [name] at [facility] in 1-2 weeks.  Call for appointment.  Condition on Discharge: stable   Tests Needing Follow-up: none   Time spent in discharge (includes decision making & examination of pt): 45 mintues    Signed: Chayson Charters 11/02/2012, 5:22 PM

## 2012-11-02 NOTE — Evaluation (Signed)
Occupational Therapy Evaluation Patient Details Name: [name] MRN: [id number] DOB: 1934/09/11 Today's Date: 11/02/2012 Time: 4696-2952 OT Time Calculation (min): 37 min  OT Assessment / Plan / Recommendation History of present illness S/P L2 KP on 11/01/12 for compression fx. Pt has previously L4 compression fx and spinal stenosis. Pt has a TLSO since 7/14 that pt reports she uses parttime.  She underwent kyphoplasty 11/01/12   Clinical Impression   Pt admitted with above. She is doing very well on evaluation.  She currently requires min A for BADLs and min A -min guard assist for functional mobility.  Pt's spouse is very supportive.  She will benefit from continued OT to maximize safety and independence with BADLs to allow her to return to modified independence.  Recommend HHOT     OT Assessment  Patient needs continued OT Services    Follow Up Recommendations  Home health OT;Supervision/Assistance - 24 hour    Barriers to Discharge      Equipment Recommendations  None recommended by OT    Recommendations for Other Services    Frequency  Min 2X/week    Precautions / Restrictions Precautions Precautions: Fall Precaution Comments: thin skin Required Braces or Orthoses: Other Brace/Splint Other Brace/Splint: TLSO was used intermittentently per pt.   Pertinent Vitals/Pain     ADL  Eating/Feeding: Independent Where Assessed - Eating/Feeding: Chair Grooming: Wash/dry hands;Wash/dry face;Teeth care;Brushing hair;Set up Where Assessed - Grooming: Unsupported sitting Upper Body Bathing: Set up Where Assessed - Upper Body Bathing: Unsupported sitting Lower Body Bathing: Minimal assistance Where Assessed - Lower Body Bathing: Supported sit to stand Upper Body Dressing: Supervision/safety Where Assessed - Upper Body Dressing: Unsupported sitting Lower Body Dressing: Minimal assistance Where Assessed - Lower Body Dressing: Supported sit to stand Toilet Transfer: Minimal  assistance Toilet Transfer Method: Sit to stand;Stand pivot Acupuncturist: Comfort height toilet;Grab bars Toileting - Clothing Manipulation and Hygiene: Minimal assistance Where Assessed - Engineer, mining and Hygiene: Standing Equipment Used: Rolling walker Transfers/Ambulation Related to ADLs: min guard assist with RW ADL Comments: Pt able to access bil. LEs by crossing ankles over knees.  She is able to don/doff socks with min guard assist. Pt does have a sock aid at home, which she states she has not been using.  She may benefit from use to reduce stress on back.  DIscussed use of reacher with her     OT Diagnosis: Generalized weakness;Acute pain  OT Problem List: Decreased strength;Impaired balance (sitting and/or standing);Decreased knowledge of use of DME or AE;Pain OT Treatment Interventions: Self-care/ADL training;DME and/or AE instruction;Therapeutic activities;Patient/family education;Balance training   OT Goals(Current goals can be found in the care plan section) Acute Rehab OT Goals Patient Stated Goal: To get back to doing everything independently  OT Goal Formulation: With patient/family Time For Goal Achievement: 11/09/12 Potential to Achieve Goals: Good ADL Goals Pt Will Perform Grooming: with supervision;standing Pt Will Perform Lower Body Bathing: with supervision;sit to/from stand Pt Will Perform Lower Body Dressing: with supervision;sit to/from stand;with adaptive equipment Pt Will Transfer to Toilet: with supervision;regular height toilet;grab bars;ambulating Pt Will Perform Toileting - Clothing Manipulation and hygiene: with supervision;sit to/from stand Pt Will Perform Tub/Shower Transfer: Tub transfer;with supervision;ambulating;tub bench;rolling walker  Visit Information  Last OT Received On: 11/02/12 Assistance Needed: +1 PT/OT Co-Evaluation/Treatment: Yes History of Present Illness: S/P L2 KP on 11/01/12 for compression fx. Pt has  previously L4 compression fx and spinal stenosis. Pt has a TLSO since 7/14 that pt reports she  uses parttime.       Prior Functioning     Home Living Family/patient expects to be discharged to:: Private residence Living Arrangements: Spouse/significant other Available Help at Discharge: Family Type of Home: House Home Access: Stairs to enter Secretary/administrator of Steps: 2 Entrance Stairs-Rails: None Home Layout: One level Home Equipment: Tub bench;Walker - 2 wheels;Bedside commode;Adaptive equipment Adaptive Equipment: Sock aid Prior Function Level of Independence: Needs assistance Gait / Transfers Assistance Needed: walks with RW. ADL's / Homemaking Assistance Needed: states she dressed self,  Communication Communication: No difficulties Dominant Hand: Right         Vision/Perception     Cognition  Cognition Arousal/Alertness: Awake/alert Behavior During Therapy: WFL for tasks assessed/performed Overall Cognitive Status: Within Functional Limits for tasks assessed    Extremity/Trunk Assessment Upper Extremity Assessment Upper Extremity Assessment: Generalized weakness Lower Extremity Assessment Lower Extremity Assessment: Defer to PT evaluation     Mobility Bed Mobility Bed Mobility: Rolling Left;Left Sidelying to Sit;Sitting - Scoot to Edge of Bed Rolling Left: 4: Min assist;With rail Left Sidelying to Sit: 4: Min assist;With rails Sitting - Scoot to [place] of Bed: 4: Min assist Details for Bed Mobility Assistance: Heavy reliance on rails Transfers Transfers: Sit to Stand;Stand to Sit Sit to Stand: 4: Min assist;With upper extremity assist;From chair/3-in-1 Stand to Sit: 4: Min guard;With upper extremity assist;To chair/3-in-1 Details for Transfer Assistance: verbal cues for hand placement.  Required min A to move into standing position the first time     Exercise     Balance     End of Session OT - End of Session Equipment Utilized During Treatment:  Rolling walker Activity Tolerance: Patient tolerated treatment well Patient left: in chair;with call bell/phone within reach Nurse Communication: Mobility status  GO     Jeani Hawking M 11/02/2012, 12:24 PM

## 2012-11-02 NOTE — Care Management Note (Addendum)
    Page 1 of 2   11/03/2012     11:46:29 AM   CARE MANAGEMENT NOTE 11/03/2012  Patient:  [name], [name]   Account Number:  [id number]  Date Initiated:  11/02/2012  Documentation initiated by:  Colleen Can  Subjective/Objective Assessment:   dx L2 compression fracture    Wants [facility] if [facility] services are needed.     Action/Plan:   CM spoke with patient. Plans are incomplete at this time . States she wants recommendation from her doctor. States if she goes home she will have the support of her spouse and son. States she already has RW and commode seat.   Anticipated DC Date:  11/03/2012   Anticipated DC Plan:  HOME W HOME HEALTH SERVICES      DC Planning Services  CM consult      [facility] Choice  HOME HEALTH   Choice offered to / List presented to:  C-1 Patient        HH arranged  HH-2 PT      [facility] agency  [facility]   Status of service:  Completed, signed off Medicare Important Message given?   (If response is "NO", the following Medicare IM given date fields will be blank) Date Medicare IM given:   Date Additional Medicare IM given:    Discharge Disposition:  HOME W HOME HEALTH SERVICES  Per UR Regulation:  Reviewed for med. necessity/level of care/duration of stay  If discussed at Long Length of Stay Meetings, dates discussed:    Comments:  11/03/2012 Colleen Can BSN RN CCM [phone] Plans are for patient to return to her home where she will have support of spouse and son. [facility] will provide HHpt services.

## 2012-11-02 NOTE — Evaluation (Signed)
Physical Therapy Evaluation Patient Details Name: [name] MRN: [id number] DOB: Jul 15, 1934 Today's Date: 11/02/2012 Time: 3244-0102 PT Time Calculation (min): 37 min  PT Assessment / Plan / Recommendation History of Present Illness  S/P L2 KP on 11/01/12 for compression fx. Pt has previously L4 compression fx and spinal stenosis. Pt has a TLSO since 7/14 that pt reports she uses parttime.  Clinical Impression  Pt has most pain during mobility in bed. Pt did opt to not use TLSO today. Pt relates that the L2 fx feels better. Pt declines need for rehab. Pt will benefit from PT while in acute care for safety during bed mobility and gait, and increasing ambulation tolerance.    PT Assessment  Patient needs continued PT services    Follow Up Recommendations  Home health PT    Does the patient have the potential to tolerate intense rehabilitation      Barriers to Discharge        Equipment Recommendations  None recommended by PT    Recommendations for Other Services     Frequency Min 5X/week    Precautions / Restrictions Precautions Precautions: Fall Precaution Comments: thin skin Required Braces or Orthoses: Other Brace/Splint Other Brace/Splint: TLSO was used intermittentently per pt.   Pertinent Vitals/Pain Pt reports 6-7 back pain, was premedicated.      Mobility  Bed Mobility Bed Mobility: Rolling Left;Left Sidelying to Sit;Sitting - Scoot to Edge of Bed Rolling Left: 4: Min assist;With rail Left Sidelying to Sit: 4: Min assist;With rails Sitting - Scoot to [place] of Bed: 4: Min assist Details for Bed Mobility Assistance: Heavy reliance on rails Transfers Sit to Stand: 4: Min assist;With upper extremity assist;From chair/3-in-1 Stand to Sit: 4: Min guard;With upper extremity assist;To chair/3-in-1 Details for Transfer Assistance: verbal cues for hand placement.  Required min A to move into standing position the first time Ambulation/Gait Ambulation/Gait  Assistance: 4: Min guard Ambulation Distance (Feet): 100 Feet Assistive device: Rolling walker Ambulation/Gait Assistance Details: cues for posture. Pt did not rely on RW for support. Gait Pattern: Step-through pattern Gait velocity: decreased.    Exercises     PT Diagnosis: Difficulty walking;Acute pain  PT Problem List: Decreased activity tolerance;Decreased mobility;Pain;Decreased knowledge of use of DME;Decreased safety awareness;Decreased knowledge of precautions PT Treatment Interventions: DME instruction;Gait training;Functional mobility training;Stair training;Therapeutic activities;Therapeutic exercise;Patient/family education     PT Goals(Current goals can be found in the care plan section) Acute Rehab PT Goals Patient Stated Goal: To get back to doing everything independently  PT Goal Formulation: With patient/family Time For Goal Achievement: 11/09/12 Potential to Achieve Goals: Good  Visit Information  Last PT Received On: 11/02/12 Assistance Needed: +1 History of Present Illness: S/P L2 KP on 11/01/12 for compression fx. Pt has previously L4 compression fx and spinal stenosis. Pt has a TLSO since 7/14 that pt reports she uses parttime.       Prior Functioning  Home Living Family/patient expects to be discharged to:: Private residence Living Arrangements: Spouse/significant other Available Help at Discharge: Family Type of Home: House Home Access: Stairs to enter Secretary/administrator of Steps: 2 Entrance Stairs-Rails: None Home Layout: One level Home Equipment: Tub bench;Walker - 2 wheels;Bedside commode;Adaptive equipment Adaptive Equipment: Sock aid Prior Function Level of Independence: Needs assistance Gait / Transfers Assistance Needed: walks with RW. ADL's / Homemaking Assistance Needed: states she dressed self,  Communication Communication: No difficulties Dominant Hand: Right    Cognition  Cognition Arousal/Alertness: Awake/alert Behavior During  Therapy: [facility]  for tasks assessed/performed Overall Cognitive Status: Within Functional Limits for tasks assessed    Extremity/Trunk Assessment Upper Extremity Assessment Upper Extremity Assessment: Defer to OT evaluation Lower Extremity Assessment Lower Extremity Assessment: Generalized weakness;RLE deficits/detail;LLE deficits/detail RLE Sensation: history of peripheral neuropathy LLE Sensation: history of peripheral neuropathy Cervical / Trunk Assessment Cervical / Trunk Assessment: Normal   Balance Balance Balance Assessed: Yes Static Sitting Balance Static Sitting - Balance Support: Bilateral upper extremity supported;No upper extremity supported;Feet supported (pt did use UE's initially) Static Sitting - Level of Assistance: 7: Independent  End of Session PT - End of Session Activity Tolerance: Patient tolerated treatment well Patient left: in chair;with call bell/phone within reach;with family/visitor present  GP     Rada Hay 11/02/2012, 12:33 PM

## 2012-11-02 NOTE — Progress Notes (Signed)
Subjective: Pt reports decreased back pain today  Objective: Vital signs in last 24 hours: Temp:  [98.3 F (36.8 C)-99.2 F (37.3 C)] 98.3 F (36.8 C) (09/04 0455) Pulse Rate:  [82-91] 89 (09/03 2100) Resp:  [8-18] 18 (09/04 0455) BP: (121-166)/(63-104) 121/77 mmHg (09/04 0455) SpO2:  [89 %-99 %] 98 % (09/04 0455) Last BM Date: 10/30/12  Intake/Output from previous day: 09/03 0701 - 09/04 0700 In: 2500 [I.V.:2500] Out: 1100 [Urine:1100] Intake/Output this shift: Total I/O In: 240 [P.O.:240] Out: -   Puncture site L2 region clean and dry,NT, no hematoma; pt moving all fours ok; no new neurological changes  Lab Results:   Recent Labs  10/30/12 1745  WBC 9.7  HGB 14.2  HCT 43.5  PLT 232   BMET  Recent Labs  10/30/12 1745  NA 141  K 4.7  CL 101  CO2 31  GLUCOSE 121*  BUN 18  CREATININE 0.92  CALCIUM 9.6   PT/INR  Recent Labs  10/31/12 1015  LABPROT 12.9  INR 0.99   ABG No results found for this basename: PHART, PCO2, PO2, HCO3,  in the last 72 hours  Studies/Results: Ir Kyphoplasty Or Sacroplasty  11/01/2012   *RADIOLOGY REPORT*  Clinical Data:  Painful L2 osteoporotic compression fracture deformity.  KYPHOPLASTY AT LUMBAR L2: Technique and findings:  The procedure, risks (including but not limited to bleeding, infection, organ damage), benefits, and alternatives were explained to the patient.  Questions regarding the procedure were encouraged and answered.  The patient understands and consents to the procedure. The patient was placed prone on the fluoroscopic table.  The skin overlying the upper lumbar region was then prepped and draped in the usual sterile fashion.  Maximal barrier sterile technique was utilized including caps, mask, sterile gowns, sterile gloves, sterile drape, hand hygiene and skin antiseptic.  Intravenous Fentanyl and Versed were administered as conscious sedation during continuous cardiorespiratory monitoring by the radiology RN,  with a total moderate sedation time of30 minutes.  As antibiotic prophylaxis, vancomycin 1 gram was ordered pre- procedure and administered intravenously within one hour of incision.  The right pedicle at L2 was then infiltrated with 1% lidocaine followed by the advancement of a Kyphon trocar needle through the right pedicle into the posterior one-third. The trocar was removed and the osteo drill was advanced to the anterior third of the vertebral body.  The osteo drill   was retracted.  Through the working cannula,  a Kyphon inflatable bone tamp 15 x 3 was advanced and positioned with the distal marker 5 mm from the anterior aspect of the cortex.  Crossing of the midline was not seen on the AP projection.  At this time, the balloon was expanded using contrast via a Kyphon inflation syringe device via micro tubing. Inflation  continued until there was near apposition across the midline and with the superior and the inferior end plates. At this time, methylmethacrylate mixture was reconstituted  in the Kyphon bone mixing device system.  This was then loaded into the delivery mechanism, attached to Kyphon bone fillers. The balloons were deflated and removed followed by the instillation of methylmethacrylate mixture  with excellent filling in the AP and lateral projections.  No extravasation was noted in the disk spaces or posteriorly into the spinal canal.  No epidural venous contamination was seen. Minimal paraspinal venous filling. The patient tolerated the procedure well.  There were no acute complications.  The working cannulae and the bone filler were then retrieved and removed.  IMPRESSION: 1.  Status post vertebral body augmentation using balloon kyphoplasty at lumbar L2  as described without event. 2. Per CMS PQRS reporting requirements (PQRS Measure 24): Given the patient's age of greater than 50 and the fracture site (hip, distal radius, or spine), the patient should be tested for osteoporosis using DXA, and  the appropriate treatment considered based on the DXA results.   Original Report Authenticated By: D. Andria Rhein, MD    Anti-infectives: Anti-infectives   Start     Dose/Rate Route Frequency Ordered Stop   10/31/12 0933  vancomycin (VANCOCIN) IVPB 1000 mg/200 mL premix    Comments:  Will be given in IR   1,000 mg 200 mL/hr over 60 Minutes Intravenous 30 min pre-op 10/31/12 0930 11/01/12 1615      Assessment/Plan: s/p L2 KP for symptomatic compression fx 9/3; PT/ambulate ; ice pack to back prn; other plans as per primary                                                                                                                                                                                                                                                                                                                                                  LOS: 3 days    ALLRED,[facility] 11/02/2012

## 2012-11-03 MED ORDER — OXYCODONE-ACETAMINOPHEN 5-325 MG PO TABS: 1.0000 | ORAL_TABLET | ORAL | Status: DC | PRN

## 2012-11-03 NOTE — Progress Notes (Signed)
OT Cancellation Note  Patient Details Name: [name] MRN: [id number] DOB: 02-08-1935   Cancelled Treatment:    Reason Eval/Treat Not Completed: Other (comment) (Pt declines OT as she is getting ready for DC)  Launi Asencio, [place]ung 11/03/2012, 10:06 AM

## 2012-11-04 DIAGNOSIS — IMO0002 Reserved for concepts with insufficient information to code with codable children: Secondary | ICD-10-CM | POA: Diagnosis not present

## 2012-11-04 DIAGNOSIS — M810 Age-related osteoporosis without current pathological fracture: Secondary | ICD-10-CM | POA: Diagnosis not present

## 2012-11-04 DIAGNOSIS — G622 Polyneuropathy due to other toxic agents: Secondary | ICD-10-CM | POA: Diagnosis not present

## 2012-11-04 DIAGNOSIS — M545 Low back pain: Secondary | ICD-10-CM | POA: Diagnosis not present

## 2012-11-04 DIAGNOSIS — IMO0001 Reserved for inherently not codable concepts without codable children: Secondary | ICD-10-CM | POA: Diagnosis not present

## 2012-11-06 DIAGNOSIS — M545 Low back pain: Secondary | ICD-10-CM | POA: Diagnosis not present

## 2012-11-06 DIAGNOSIS — G622 Polyneuropathy due to other toxic agents: Secondary | ICD-10-CM | POA: Diagnosis not present

## 2012-11-06 DIAGNOSIS — IMO0001 Reserved for inherently not codable concepts without codable children: Secondary | ICD-10-CM | POA: Diagnosis not present

## 2012-11-06 DIAGNOSIS — M810 Age-related osteoporosis without current pathological fracture: Secondary | ICD-10-CM | POA: Diagnosis not present

## 2012-11-06 DIAGNOSIS — IMO0002 Reserved for concepts with insufficient information to code with codable children: Secondary | ICD-10-CM | POA: Diagnosis not present

## 2012-11-08 DIAGNOSIS — IMO0001 Reserved for inherently not codable concepts without codable children: Secondary | ICD-10-CM | POA: Diagnosis not present

## 2012-11-08 DIAGNOSIS — G622 Polyneuropathy due to other toxic agents: Secondary | ICD-10-CM | POA: Diagnosis not present

## 2012-11-08 DIAGNOSIS — M810 Age-related osteoporosis without current pathological fracture: Secondary | ICD-10-CM | POA: Diagnosis not present

## 2012-11-08 DIAGNOSIS — IMO0002 Reserved for concepts with insufficient information to code with codable children: Secondary | ICD-10-CM | POA: Diagnosis not present

## 2012-11-08 DIAGNOSIS — M545 Low back pain: Secondary | ICD-10-CM | POA: Diagnosis not present

## 2012-11-10 DIAGNOSIS — S8990XA Unspecified injury of unspecified lower leg, initial encounter: Secondary | ICD-10-CM | POA: Diagnosis not present

## 2012-11-10 DIAGNOSIS — M25579 Pain in unspecified ankle and joints of unspecified foot: Secondary | ICD-10-CM | POA: Diagnosis not present

## 2012-11-10 DIAGNOSIS — M810 Age-related osteoporosis without current pathological fracture: Secondary | ICD-10-CM | POA: Diagnosis not present

## 2012-11-10 DIAGNOSIS — IMO0002 Reserved for concepts with insufficient information to code with codable children: Secondary | ICD-10-CM | POA: Diagnosis not present

## 2012-11-10 DIAGNOSIS — M545 Low back pain: Secondary | ICD-10-CM | POA: Diagnosis not present

## 2012-11-10 DIAGNOSIS — IMO0001 Reserved for inherently not codable concepts without codable children: Secondary | ICD-10-CM | POA: Diagnosis not present

## 2012-11-10 DIAGNOSIS — G622 Polyneuropathy due to other toxic agents: Secondary | ICD-10-CM | POA: Diagnosis not present

## 2012-11-11 DIAGNOSIS — G622 Polyneuropathy due to other toxic agents: Secondary | ICD-10-CM | POA: Diagnosis not present

## 2012-11-11 DIAGNOSIS — IMO0001 Reserved for inherently not codable concepts without codable children: Secondary | ICD-10-CM | POA: Diagnosis not present

## 2012-11-11 DIAGNOSIS — IMO0002 Reserved for concepts with insufficient information to code with codable children: Secondary | ICD-10-CM | POA: Diagnosis not present

## 2012-11-11 DIAGNOSIS — M545 Low back pain: Secondary | ICD-10-CM | POA: Diagnosis not present

## 2012-11-11 DIAGNOSIS — M810 Age-related osteoporosis without current pathological fracture: Secondary | ICD-10-CM | POA: Diagnosis not present

## 2012-11-12 DIAGNOSIS — G622 Polyneuropathy due to other toxic agents: Secondary | ICD-10-CM | POA: Diagnosis not present

## 2012-11-12 DIAGNOSIS — M545 Low back pain: Secondary | ICD-10-CM | POA: Diagnosis not present

## 2012-11-12 DIAGNOSIS — IMO0002 Reserved for concepts with insufficient information to code with codable children: Secondary | ICD-10-CM | POA: Diagnosis not present

## 2012-11-12 DIAGNOSIS — IMO0001 Reserved for inherently not codable concepts without codable children: Secondary | ICD-10-CM | POA: Diagnosis not present

## 2012-11-12 DIAGNOSIS — M810 Age-related osteoporosis without current pathological fracture: Secondary | ICD-10-CM | POA: Diagnosis not present

## 2012-11-13 DIAGNOSIS — IMO0002 Reserved for concepts with insufficient information to code with codable children: Secondary | ICD-10-CM | POA: Diagnosis not present

## 2012-11-13 DIAGNOSIS — G622 Polyneuropathy due to other toxic agents: Secondary | ICD-10-CM | POA: Diagnosis not present

## 2012-11-13 DIAGNOSIS — M810 Age-related osteoporosis without current pathological fracture: Secondary | ICD-10-CM | POA: Diagnosis not present

## 2012-11-13 DIAGNOSIS — IMO0001 Reserved for inherently not codable concepts without codable children: Secondary | ICD-10-CM | POA: Diagnosis not present

## 2012-11-13 DIAGNOSIS — M545 Low back pain: Secondary | ICD-10-CM | POA: Diagnosis not present

## 2012-11-14 DIAGNOSIS — M810 Age-related osteoporosis without current pathological fracture: Secondary | ICD-10-CM | POA: Diagnosis not present

## 2012-11-14 DIAGNOSIS — M545 Low back pain: Secondary | ICD-10-CM | POA: Diagnosis not present

## 2012-11-14 DIAGNOSIS — IMO0002 Reserved for concepts with insufficient information to code with codable children: Secondary | ICD-10-CM | POA: Diagnosis not present

## 2012-11-14 DIAGNOSIS — IMO0001 Reserved for inherently not codable concepts without codable children: Secondary | ICD-10-CM | POA: Diagnosis not present

## 2012-11-14 DIAGNOSIS — G622 Polyneuropathy due to other toxic agents: Secondary | ICD-10-CM | POA: Diagnosis not present

## 2012-11-15 DIAGNOSIS — IMO0001 Reserved for inherently not codable concepts without codable children: Secondary | ICD-10-CM | POA: Diagnosis not present

## 2012-11-15 DIAGNOSIS — IMO0002 Reserved for concepts with insufficient information to code with codable children: Secondary | ICD-10-CM | POA: Diagnosis not present

## 2012-11-15 DIAGNOSIS — M8448XA Pathological fracture, other site, initial encounter for fracture: Secondary | ICD-10-CM | POA: Diagnosis not present

## 2012-11-15 DIAGNOSIS — M545 Low back pain: Secondary | ICD-10-CM | POA: Diagnosis not present

## 2012-11-15 DIAGNOSIS — M810 Age-related osteoporosis without current pathological fracture: Secondary | ICD-10-CM | POA: Diagnosis not present

## 2012-11-15 DIAGNOSIS — G622 Polyneuropathy due to other toxic agents: Secondary | ICD-10-CM | POA: Diagnosis not present

## 2012-11-17 ENCOUNTER — Other Ambulatory Visit: Payer: Self-pay | Admitting: Neurological Surgery

## 2012-11-17 DIAGNOSIS — M8448XA Pathological fracture, other site, initial encounter for fracture: Secondary | ICD-10-CM

## 2012-11-18 DIAGNOSIS — G622 Polyneuropathy due to other toxic agents: Secondary | ICD-10-CM | POA: Diagnosis not present

## 2012-11-18 DIAGNOSIS — IMO0001 Reserved for inherently not codable concepts without codable children: Secondary | ICD-10-CM | POA: Diagnosis not present

## 2012-11-18 DIAGNOSIS — M545 Low back pain: Secondary | ICD-10-CM | POA: Diagnosis not present

## 2012-11-18 DIAGNOSIS — M810 Age-related osteoporosis without current pathological fracture: Secondary | ICD-10-CM | POA: Diagnosis not present

## 2012-11-18 DIAGNOSIS — IMO0002 Reserved for concepts with insufficient information to code with codable children: Secondary | ICD-10-CM | POA: Diagnosis not present

## 2012-11-20 DIAGNOSIS — G622 Polyneuropathy due to other toxic agents: Secondary | ICD-10-CM | POA: Diagnosis not present

## 2012-11-20 DIAGNOSIS — IMO0002 Reserved for concepts with insufficient information to code with codable children: Secondary | ICD-10-CM | POA: Diagnosis not present

## 2012-11-20 DIAGNOSIS — M545 Low back pain: Secondary | ICD-10-CM | POA: Diagnosis not present

## 2012-11-20 DIAGNOSIS — IMO0001 Reserved for inherently not codable concepts without codable children: Secondary | ICD-10-CM | POA: Diagnosis not present

## 2012-11-20 DIAGNOSIS — M810 Age-related osteoporosis without current pathological fracture: Secondary | ICD-10-CM | POA: Diagnosis not present

## 2012-11-22 DIAGNOSIS — M545 Low back pain: Secondary | ICD-10-CM | POA: Diagnosis not present

## 2012-11-22 DIAGNOSIS — IMO0001 Reserved for inherently not codable concepts without codable children: Secondary | ICD-10-CM | POA: Diagnosis not present

## 2012-11-22 DIAGNOSIS — M810 Age-related osteoporosis without current pathological fracture: Secondary | ICD-10-CM | POA: Diagnosis not present

## 2012-11-22 DIAGNOSIS — G622 Polyneuropathy due to other toxic agents: Secondary | ICD-10-CM | POA: Diagnosis not present

## 2012-11-22 DIAGNOSIS — IMO0002 Reserved for concepts with insufficient information to code with codable children: Secondary | ICD-10-CM | POA: Diagnosis not present

## 2012-11-24 DIAGNOSIS — E2749 Other adrenocortical insufficiency: Secondary | ICD-10-CM | POA: Diagnosis not present

## 2012-11-24 DIAGNOSIS — Z23 Encounter for immunization: Secondary | ICD-10-CM | POA: Diagnosis not present

## 2012-11-24 DIAGNOSIS — R269 Unspecified abnormalities of gait and mobility: Secondary | ICD-10-CM | POA: Diagnosis not present

## 2012-11-24 DIAGNOSIS — Z6827 Body mass index (BMI) 27.0-27.9, adult: Secondary | ICD-10-CM | POA: Diagnosis not present

## 2012-11-24 DIAGNOSIS — S41009A Unspecified open wound of unspecified shoulder, initial encounter: Secondary | ICD-10-CM | POA: Diagnosis not present

## 2012-11-25 ENCOUNTER — Ambulatory Visit
Admission: RE | Admit: 2012-11-25 | Discharge: 2012-11-25 | Disposition: A | Discharge status: Home / Self Care | Payer: Medicare Other | Source: Ambulatory Visit | Attending: Neurological Surgery | Admitting: Neurological Surgery

## 2012-11-25 DIAGNOSIS — M549 Dorsalgia, unspecified: Secondary | ICD-10-CM | POA: Diagnosis not present

## 2012-11-25 DIAGNOSIS — M8448XA Pathological fracture, other site, initial encounter for fracture: Secondary | ICD-10-CM

## 2012-11-25 DIAGNOSIS — IMO0002 Reserved for concepts with insufficient information to code with codable children: Secondary | ICD-10-CM | POA: Diagnosis not present

## 2012-11-27 DIAGNOSIS — M810 Age-related osteoporosis without current pathological fracture: Secondary | ICD-10-CM | POA: Diagnosis not present

## 2012-11-27 DIAGNOSIS — IMO0002 Reserved for concepts with insufficient information to code with codable children: Secondary | ICD-10-CM | POA: Diagnosis not present

## 2012-11-27 DIAGNOSIS — G622 Polyneuropathy due to other toxic agents: Secondary | ICD-10-CM | POA: Diagnosis not present

## 2012-11-27 DIAGNOSIS — M545 Low back pain: Secondary | ICD-10-CM | POA: Diagnosis not present

## 2012-11-27 DIAGNOSIS — IMO0001 Reserved for inherently not codable concepts without codable children: Secondary | ICD-10-CM | POA: Diagnosis not present

## 2012-11-29 DIAGNOSIS — M545 Low back pain: Secondary | ICD-10-CM | POA: Diagnosis not present

## 2012-11-29 IMAGING — RF DG FEMUR 2V*L*
1 series · 5 of 5 positions shown · non-contrast
Comparison: Left femur radiographs performed earlier today at [DATE]
p.m.

CLINICAL DATA: Left-sided intramedullary rod placement.

LEFT FEMUR - 2 VIEW

[Series 1: run · 5 of 5 slices shown]
[im 1/5]
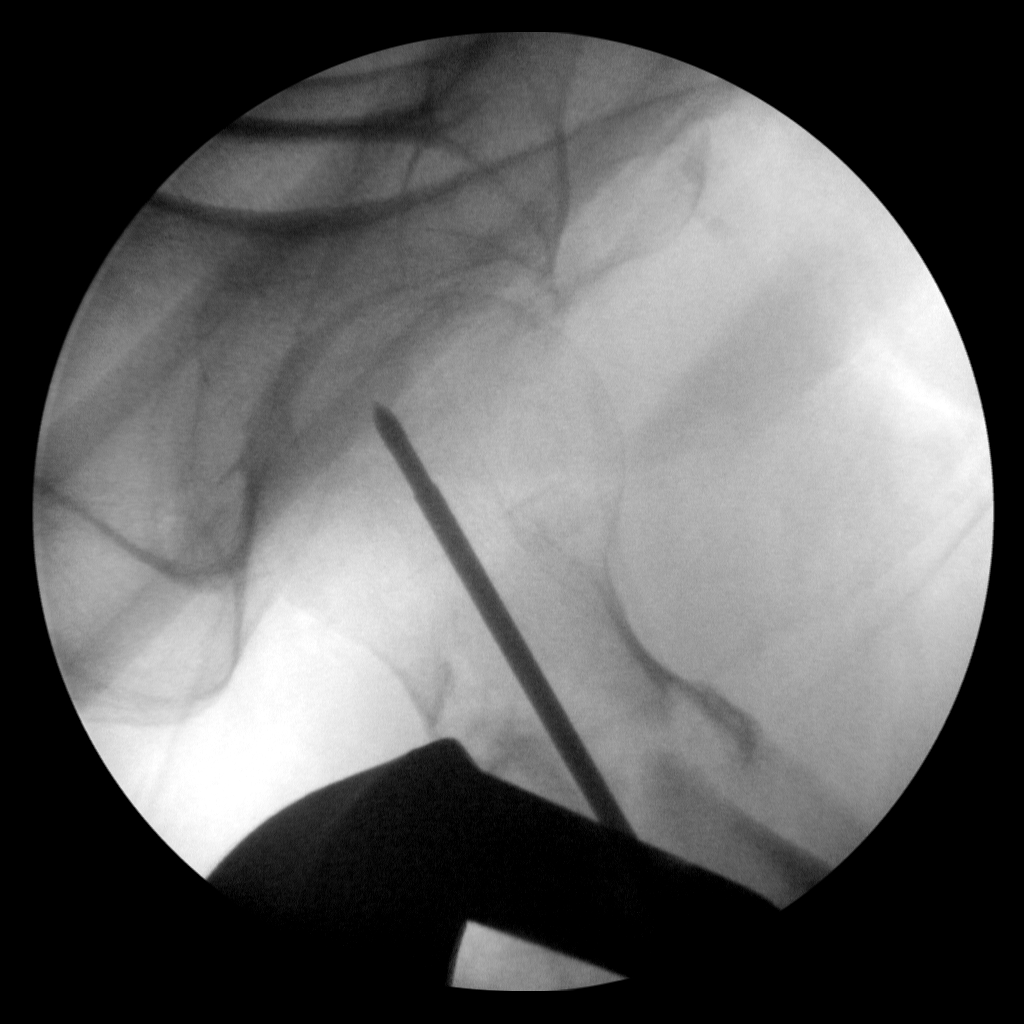
[im 2/5]
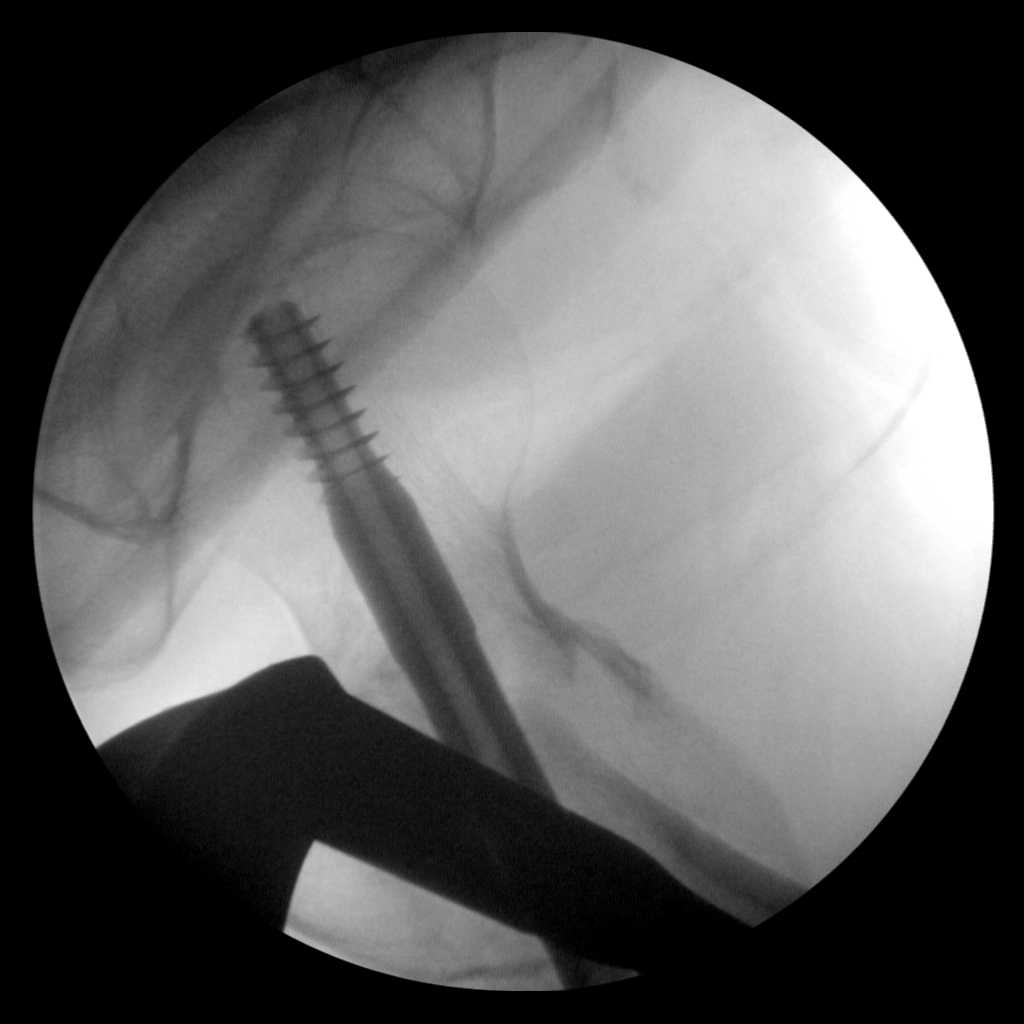
[im 3/5]
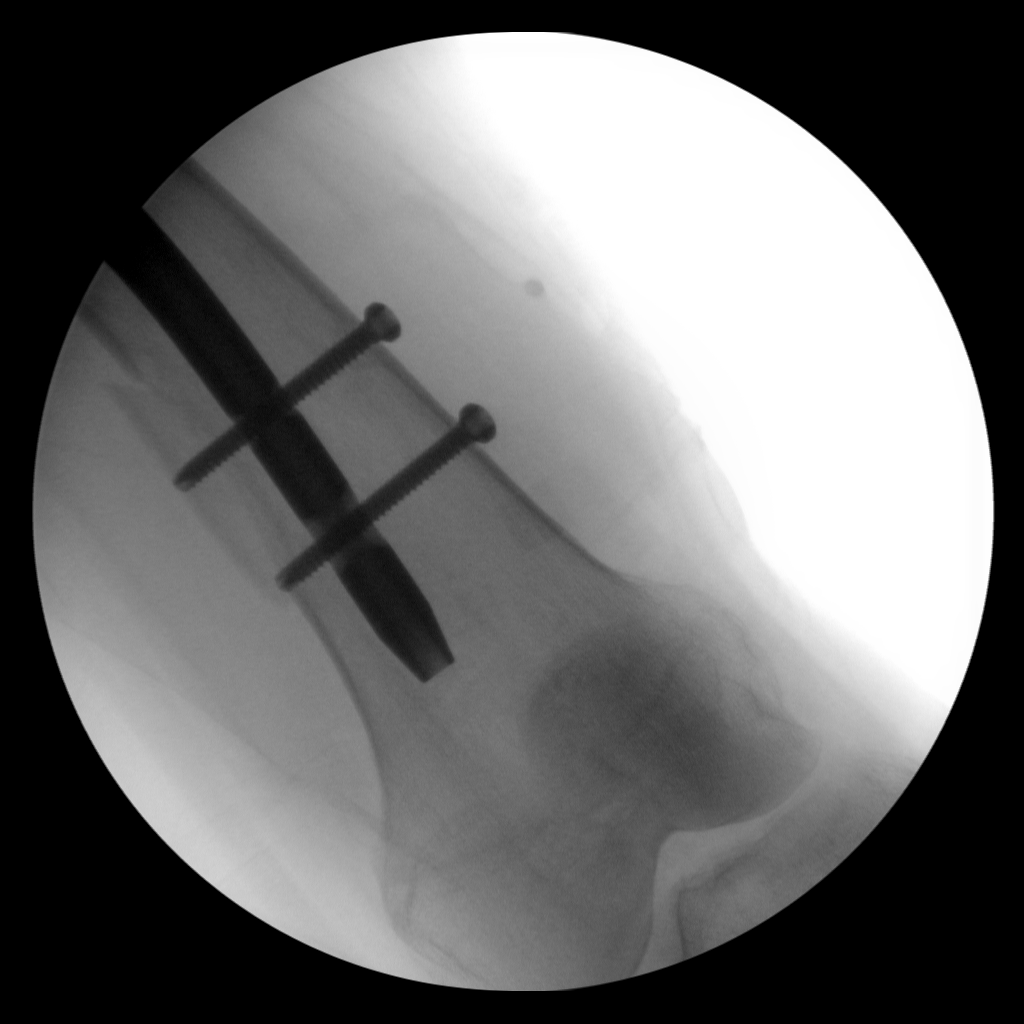
[im 4/5]
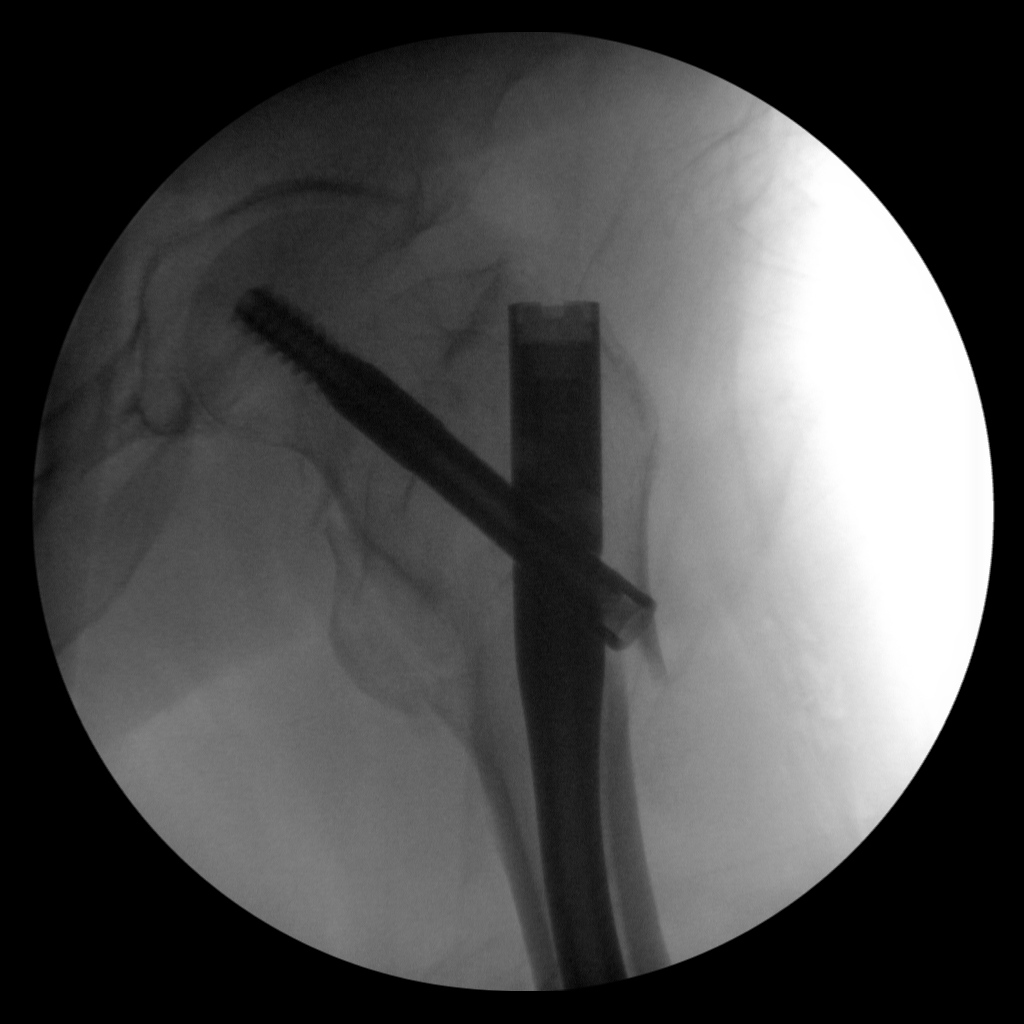
[im 5/5]
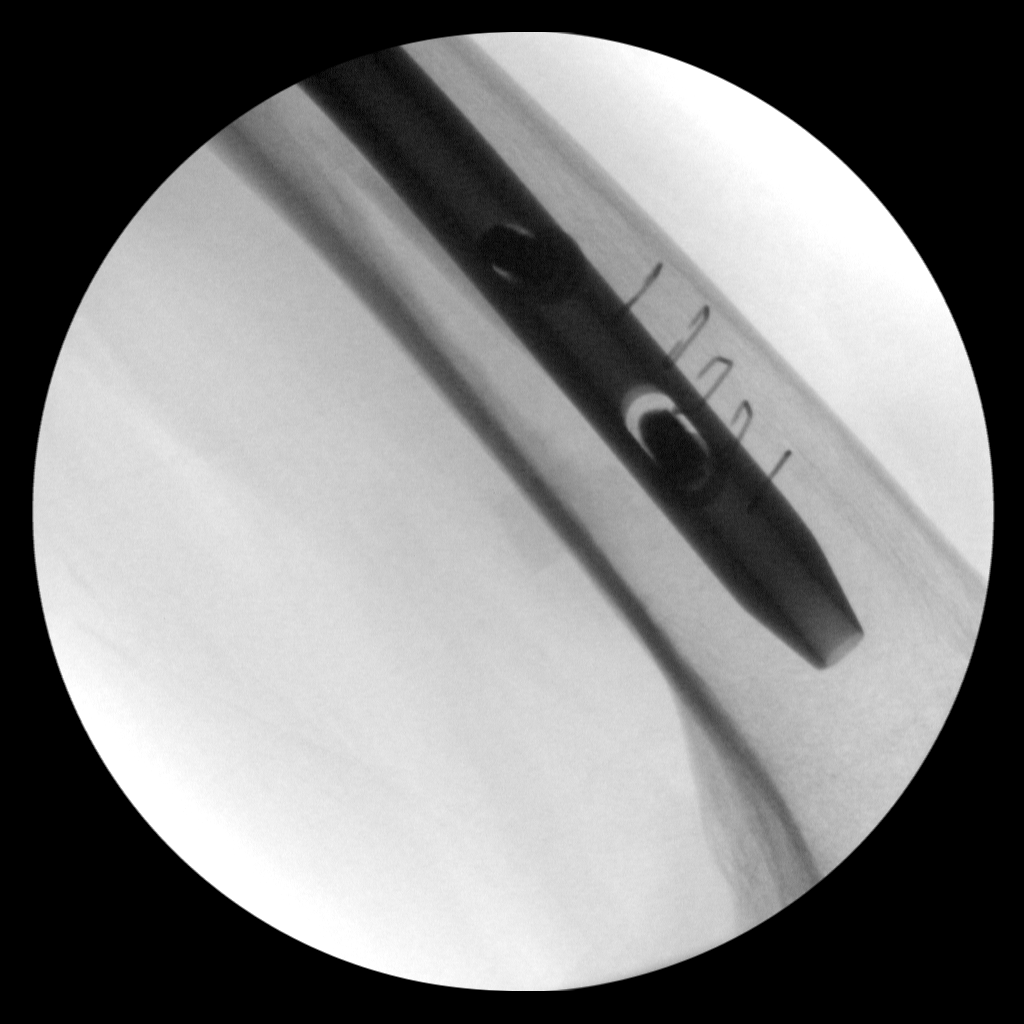

[5 of 5 positions shown; findings below may reference images not displayed]

FINDINGS: Five fluoroscopic C-arm images are provided from the OR,
demonstrating successful placement of an intramedullary rod and hip
screw transfixing the patient's comminuted intertrochanteric
fracture in grossly anatomic alignment.  No new fractures are
identified.  Overlying skin staples are seen.  The left femoral
head remains seated at the acetabulum.
IMPRESSION: Successful placement of intramedullary rod and hip screw,
transfixing the patient's comminuted intertrochanteric fracture in
grossly anatomic alignment.  No new fractures seen.

## 2012-11-30 DIAGNOSIS — IMO0002 Reserved for concepts with insufficient information to code with codable children: Secondary | ICD-10-CM | POA: Diagnosis not present

## 2012-11-30 DIAGNOSIS — M810 Age-related osteoporosis without current pathological fracture: Secondary | ICD-10-CM | POA: Diagnosis not present

## 2012-11-30 DIAGNOSIS — G622 Polyneuropathy due to other toxic agents: Secondary | ICD-10-CM | POA: Diagnosis not present

## 2012-11-30 DIAGNOSIS — M545 Low back pain: Secondary | ICD-10-CM | POA: Diagnosis not present

## 2012-11-30 DIAGNOSIS — IMO0001 Reserved for inherently not codable concepts without codable children: Secondary | ICD-10-CM | POA: Diagnosis not present

## 2012-12-01 DIAGNOSIS — S41009A Unspecified open wound of unspecified shoulder, initial encounter: Secondary | ICD-10-CM | POA: Diagnosis not present

## 2012-12-01 DIAGNOSIS — R269 Unspecified abnormalities of gait and mobility: Secondary | ICD-10-CM | POA: Diagnosis not present

## 2012-12-01 DIAGNOSIS — M47817 Spondylosis without myelopathy or radiculopathy, lumbosacral region: Secondary | ICD-10-CM | POA: Diagnosis not present

## 2012-12-01 DIAGNOSIS — G609 Hereditary and idiopathic neuropathy, unspecified: Secondary | ICD-10-CM | POA: Diagnosis not present

## 2012-12-01 DIAGNOSIS — R569 Unspecified convulsions: Secondary | ICD-10-CM | POA: Diagnosis not present

## 2012-12-01 DIAGNOSIS — M48061 Spinal stenosis, lumbar region without neurogenic claudication: Secondary | ICD-10-CM | POA: Diagnosis not present

## 2012-12-01 DIAGNOSIS — E2749 Other adrenocortical insufficiency: Secondary | ICD-10-CM | POA: Diagnosis not present

## 2012-12-01 DIAGNOSIS — S81009A Unspecified open wound, unspecified knee, initial encounter: Secondary | ICD-10-CM | POA: Diagnosis not present

## 2012-12-01 DIAGNOSIS — S32009A Unspecified fracture of unspecified lumbar vertebra, initial encounter for closed fracture: Secondary | ICD-10-CM | POA: Diagnosis not present

## 2012-12-01 DIAGNOSIS — S8990XA Unspecified injury of unspecified lower leg, initial encounter: Secondary | ICD-10-CM | POA: Diagnosis not present

## 2012-12-04 DIAGNOSIS — G622 Polyneuropathy due to other toxic agents: Secondary | ICD-10-CM | POA: Diagnosis not present

## 2012-12-04 DIAGNOSIS — IMO0001 Reserved for inherently not codable concepts without codable children: Secondary | ICD-10-CM | POA: Diagnosis not present

## 2012-12-04 DIAGNOSIS — M810 Age-related osteoporosis without current pathological fracture: Secondary | ICD-10-CM | POA: Diagnosis not present

## 2012-12-04 DIAGNOSIS — M545 Low back pain: Secondary | ICD-10-CM | POA: Diagnosis not present

## 2012-12-04 DIAGNOSIS — IMO0002 Reserved for concepts with insufficient information to code with codable children: Secondary | ICD-10-CM | POA: Diagnosis not present

## 2012-12-05 ENCOUNTER — Ambulatory Visit (INDEPENDENT_AMBULATORY_CARE_PROVIDER_SITE_OTHER): Payer: Medicare Other | Admitting: Neurology

## 2012-12-05 ENCOUNTER — Encounter: Payer: Self-pay | Admitting: Neurology

## 2012-12-05 VITALS — Ht 65.0 in | Wt 165.0 lb

## 2012-12-05 DIAGNOSIS — K589 Irritable bowel syndrome without diarrhea: Secondary | ICD-10-CM

## 2012-12-05 DIAGNOSIS — E785 Hyperlipidemia, unspecified: Secondary | ICD-10-CM

## 2012-12-05 DIAGNOSIS — M810 Age-related osteoporosis without current pathological fracture: Secondary | ICD-10-CM

## 2012-12-05 DIAGNOSIS — R222 Localized swelling, mass and lump, trunk: Secondary | ICD-10-CM

## 2012-12-05 DIAGNOSIS — G4733 Obstructive sleep apnea (adult) (pediatric): Secondary | ICD-10-CM

## 2012-12-05 NOTE — Progress Notes (Signed)
HPI: [name] is a 78 years old right-handed Caucasian female, accompanied by her husband, referred by her primary care physician Dr. Adrian Prince for evaluation of transient loss of consciousness  She had a past medical history of hyperlipidemia, hypothyroidism,left breast cancer in 1997, right breast cancer in 2012, status post lobectomy, followed by chemotherapy, and radiation therapy, during the chemotherapy, she began to notice bilateral feet and fingertips paresthesia, she also suffered a fall accident from radiation table, with her left femur fracture  She reports nine-month history of passing out, had a total 6 episode, all has similar clinical presentation, the most recent one was 4 weeks ago, after sitting in chair for a while, she got up, walking towards the kitchen, without warning signs, she fell to the ground, transient loss of consciousness, all the episode were similar.  When she got up quickly from a seated position, she also complains of lightheadedness, there was reported orthostatic blood pressure changes, she was put on midodrine which does help her symptoms,  [facility] Record reviewed, MRI of the brain with and without contrast September 2012 was normal, CT scan of the brain with and without contrast September 2012 was normal  She has mild gait difficulty due to a left femoral fracture, there was no other focal signs,  Later, her family reported more episodes of loss of consciousness, without warning signs over past 6 months, besides those described positional related lightheadedness, orthostatic symptoms. There was one episode she fell out without warning signs, landed on the floor, loss of consciousness witnessed by her husband and son, have seizure-like activity, with bowel and bladder incontinence, followed by mild postictal confusion. EEG has showed left temporal lobe irritation, she was started on levetiractam 250 mg twice a day tolerating it well, there was no recurrent  similar symptoms.    Repeat  MRI brain showed mild-to-moderate atrophy, there was no acute intracranial lesions  UPDATE April 16th 2014: She reported recurrent episode of bilateral lower extremity weakness sensation, shaky, loss of consciousness, she had 3 episodes in past 2 weeks, there was one episode witnessed by her family had seizure-like activity, bladder incontinence, lasting few minutes, she is taking Keppra 250 twice a day, no significant side effects, reported that he noticed 50% improvement, she is also taking midodrine 5 mg twice a day,   Her daughter also reported excessive snoring, sleepiness during the daytime, today's ESS score is 15, FSS score is 49  UPDATE Oct 7th 2014: She continues to have bilateral upper and lower extremity bruise, mild gait difficulty, low back pain, has fell multiple times, she has compression fracture of her lumbar spine, is going to have epidural injections.   Neck: supple no carotid bruits Respiratory: clear to auscultation bilaterally Cardiovascular: regular rate rhythm  Neurologic Exam  Mental Status: pleasant, awake, alert, cooperative to history, talking, and casual conversation. Cranial Nerves: CN II-XII pupils were equal round reactive to light.  Fundi were sharp bilaterally.  Extraocular movements were full.  Visual fields were full on confrontational test.  Facial sensation and strength were normal.  Hearing was intact to finger rubbing bilaterally.  Uvula tongue were midline.  Head turning and shoulder shrugging were normal and symmetric.  Tongue protrusion into the cheeks strength were normal.  Motor: Normal tone, bulk, and strength. Sensory: mildly length dependent decreased to light touch, pinprick at ankle level, normal toe proprioception, and decreased toe vibratory sensation, very thin bruised skin. Coordination: Normal finger-to-nose, heel-to-shin.  There was no dysmetria noticed. Gait and  Station: limp, cautious Reflexes: Deep tendon  reflexes: Biceps: 1/1, Brachioradialis:1/1  Triceps: 1/1, Pateller: 1/1, Achilles: trace.  Plantar responses are flexor.  Assessment and Plan:  78 years old right-handed Caucasian female, with episodes of passing out, most suggestive of orthostatic syncope, also different episodes, probable seizure.  1. Continue with recurrent partial seizure, will increase Keppra to 500 mg twice a day,  2. Her gait difficult is a combination of age, deconditioning, low back pain. 3. return to clinic in 6 months.

## 2012-12-06 DIAGNOSIS — IMO0001 Reserved for inherently not codable concepts without codable children: Secondary | ICD-10-CM | POA: Diagnosis not present

## 2012-12-06 DIAGNOSIS — IMO0002 Reserved for concepts with insufficient information to code with codable children: Secondary | ICD-10-CM | POA: Diagnosis not present

## 2012-12-06 DIAGNOSIS — G622 Polyneuropathy due to other toxic agents: Secondary | ICD-10-CM | POA: Diagnosis not present

## 2012-12-06 DIAGNOSIS — M810 Age-related osteoporosis without current pathological fracture: Secondary | ICD-10-CM | POA: Diagnosis not present

## 2012-12-06 DIAGNOSIS — M545 Low back pain: Secondary | ICD-10-CM | POA: Diagnosis not present

## 2012-12-11 DIAGNOSIS — M545 Low back pain: Secondary | ICD-10-CM | POA: Diagnosis not present

## 2012-12-11 DIAGNOSIS — IMO0001 Reserved for inherently not codable concepts without codable children: Secondary | ICD-10-CM | POA: Diagnosis not present

## 2012-12-11 DIAGNOSIS — G622 Polyneuropathy due to other toxic agents: Secondary | ICD-10-CM | POA: Diagnosis not present

## 2012-12-11 DIAGNOSIS — IMO0002 Reserved for concepts with insufficient information to code with codable children: Secondary | ICD-10-CM | POA: Diagnosis not present

## 2012-12-11 DIAGNOSIS — M810 Age-related osteoporosis without current pathological fracture: Secondary | ICD-10-CM | POA: Diagnosis not present

## 2012-12-13 DIAGNOSIS — M545 Low back pain: Secondary | ICD-10-CM | POA: Diagnosis not present

## 2012-12-13 DIAGNOSIS — IMO0002 Reserved for concepts with insufficient information to code with codable children: Secondary | ICD-10-CM | POA: Diagnosis not present

## 2012-12-13 DIAGNOSIS — G622 Polyneuropathy due to other toxic agents: Secondary | ICD-10-CM | POA: Diagnosis not present

## 2012-12-13 DIAGNOSIS — M810 Age-related osteoporosis without current pathological fracture: Secondary | ICD-10-CM | POA: Diagnosis not present

## 2012-12-13 DIAGNOSIS — IMO0001 Reserved for inherently not codable concepts without codable children: Secondary | ICD-10-CM | POA: Diagnosis not present

## 2012-12-14 DIAGNOSIS — M545 Low back pain: Secondary | ICD-10-CM | POA: Diagnosis not present

## 2012-12-14 DIAGNOSIS — G622 Polyneuropathy due to other toxic agents: Secondary | ICD-10-CM | POA: Diagnosis not present

## 2012-12-14 DIAGNOSIS — IMO0002 Reserved for concepts with insufficient information to code with codable children: Secondary | ICD-10-CM | POA: Diagnosis not present

## 2012-12-14 DIAGNOSIS — M810 Age-related osteoporosis without current pathological fracture: Secondary | ICD-10-CM | POA: Diagnosis not present

## 2012-12-14 DIAGNOSIS — IMO0001 Reserved for inherently not codable concepts without codable children: Secondary | ICD-10-CM | POA: Diagnosis not present

## 2012-12-18 ENCOUNTER — Telehealth: Payer: Self-pay | Admitting: Oncology

## 2012-12-18 ENCOUNTER — Encounter: Payer: Self-pay | Admitting: *Deleted

## 2012-12-18 DIAGNOSIS — IMO0001 Reserved for inherently not codable concepts without codable children: Secondary | ICD-10-CM | POA: Diagnosis not present

## 2012-12-18 DIAGNOSIS — IMO0002 Reserved for concepts with insufficient information to code with codable children: Secondary | ICD-10-CM | POA: Diagnosis not present

## 2012-12-18 DIAGNOSIS — G622 Polyneuropathy due to other toxic agents: Secondary | ICD-10-CM | POA: Diagnosis not present

## 2012-12-18 DIAGNOSIS — M545 Low back pain: Secondary | ICD-10-CM | POA: Diagnosis not present

## 2012-12-18 DIAGNOSIS — M810 Age-related osteoporosis without current pathological fracture: Secondary | ICD-10-CM | POA: Diagnosis not present

## 2012-12-18 NOTE — Progress Notes (Signed)
Pt daughter [name] called to state mother in excruciating pain.  She recently had MRI showed negative for bone mets.  Pt had kyphoplasty on 10/31/12.  Pt daughter relate the pain medicine isn't helping and her neurosurgeon didn't feel he could do anything else for her.  I took MRI report to Dr. Darnelle Catalan for review as well as last note.  Dr. Darnelle Catalan feels as if she does not have bone mets.  He referred her to see Dr. Thyra Breed at the pain clinic.  Informed pt daughter of this.  Pt daughter will call and inform her of this plan.  No further needs voiced at this time.

## 2012-12-18 NOTE — Telephone Encounter (Signed)
, °

## 2012-12-20 DIAGNOSIS — M545 Low back pain: Secondary | ICD-10-CM | POA: Diagnosis not present

## 2012-12-20 DIAGNOSIS — M810 Age-related osteoporosis without current pathological fracture: Secondary | ICD-10-CM | POA: Diagnosis not present

## 2012-12-20 DIAGNOSIS — IMO0001 Reserved for inherently not codable concepts without codable children: Secondary | ICD-10-CM | POA: Diagnosis not present

## 2012-12-20 DIAGNOSIS — Z6826 Body mass index (BMI) 26.0-26.9, adult: Secondary | ICD-10-CM | POA: Diagnosis not present

## 2012-12-20 DIAGNOSIS — E2749 Other adrenocortical insufficiency: Secondary | ICD-10-CM | POA: Diagnosis not present

## 2012-12-20 DIAGNOSIS — R609 Edema, unspecified: Secondary | ICD-10-CM | POA: Diagnosis not present

## 2012-12-20 DIAGNOSIS — IMO0002 Reserved for concepts with insufficient information to code with codable children: Secondary | ICD-10-CM | POA: Diagnosis not present

## 2012-12-20 DIAGNOSIS — G622 Polyneuropathy due to other toxic agents: Secondary | ICD-10-CM | POA: Diagnosis not present

## 2012-12-28 DIAGNOSIS — M8448XA Pathological fracture, other site, initial encounter for fracture: Secondary | ICD-10-CM | POA: Diagnosis not present

## 2012-12-28 DIAGNOSIS — M4310 Spondylolisthesis, site unspecified: Secondary | ICD-10-CM | POA: Diagnosis not present

## 2012-12-29 DIAGNOSIS — M8448XA Pathological fracture, other site, initial encounter for fracture: Secondary | ICD-10-CM | POA: Diagnosis not present

## 2013-01-04 ENCOUNTER — Ambulatory Visit (HOSPITAL_BASED_OUTPATIENT_CLINIC_OR_DEPARTMENT_OTHER): Payer: Medicare Other | Admitting: Oncology

## 2013-01-04 ENCOUNTER — Telehealth: Payer: Self-pay | Admitting: Oncology

## 2013-01-04 ENCOUNTER — Other Ambulatory Visit (HOSPITAL_BASED_OUTPATIENT_CLINIC_OR_DEPARTMENT_OTHER): Payer: Medicare Other

## 2013-01-04 VITALS — Ht 65.0 in | Wt 177.0 lb

## 2013-01-04 DIAGNOSIS — I8310 Varicose veins of unspecified lower extremity with inflammation: Secondary | ICD-10-CM | POA: Diagnosis not present

## 2013-01-04 DIAGNOSIS — C50419 Malignant neoplasm of upper-outer quadrant of unspecified female breast: Secondary | ICD-10-CM

## 2013-01-04 DIAGNOSIS — C50911 Malignant neoplasm of unspecified site of right female breast: Secondary | ICD-10-CM

## 2013-01-04 DIAGNOSIS — C50411 Malignant neoplasm of upper-outer quadrant of right female breast: Secondary | ICD-10-CM | POA: Insufficient documentation

## 2013-01-04 DIAGNOSIS — E559 Vitamin D deficiency, unspecified: Secondary | ICD-10-CM

## 2013-01-04 DIAGNOSIS — C50919 Malignant neoplasm of unspecified site of unspecified female breast: Secondary | ICD-10-CM | POA: Diagnosis not present

## 2013-01-04 DIAGNOSIS — M810 Age-related osteoporosis without current pathological fracture: Secondary | ICD-10-CM

## 2013-01-04 DIAGNOSIS — K5900 Constipation, unspecified: Secondary | ICD-10-CM

## 2013-01-04 DIAGNOSIS — IMO0002 Reserved for concepts with insufficient information to code with codable children: Secondary | ICD-10-CM

## 2013-01-04 DIAGNOSIS — D0512 Intraductal carcinoma in situ of left breast: Secondary | ICD-10-CM | POA: Insufficient documentation

## 2013-01-04 DIAGNOSIS — Z853 Personal history of malignant neoplasm of breast: Secondary | ICD-10-CM

## 2013-01-04 LAB — CBC WITH DIFFERENTIAL/PLATELET
Eosinophils Absolute: 0.1 10*3/uL (ref 0.0–0.5)
HCT: 36.6 % (ref 34.8–46.6)
HGB: 11.8 g/dL (ref 11.6–15.9)
LYMPH%: 21 % (ref 14.0–49.7)
MONO#: 0.7 10*3/uL (ref 0.1–0.9)
NEUT#: 6.5 10*3/uL (ref 1.5–6.5)
NEUT%: 70.6 % (ref 38.4–76.8)
Platelets: 217 10*3/uL (ref 145–400)
RBC: 3.89 10*6/uL (ref 3.70–5.45)
WBC: 9.2 10*3/uL (ref 3.9–10.3)

## 2013-01-04 LAB — COMPREHENSIVE METABOLIC PANEL (CC13)
ALT: 30 U/L (ref 0–55)
AST: 17 U/L (ref 5–34)
Albumin: 2.8 g/dL — ABNORMAL LOW (ref 3.5–5.0)
BUN: 18.3 mg/dL (ref 7.0–26.0)
Calcium: 9.3 mg/dL (ref 8.4–10.4)
Chloride: 104 mEq/L (ref 98–109)
Potassium: 3.7 mEq/L (ref 3.5–5.1)

## 2013-01-04 NOTE — Progress Notes (Signed)
[facility]  Telephone:(336) [phone] Fax:(336) [phone]  OFFICE PROGRESS NOTE   ID: [name]   DOB: 11/14/1934  MR#: [id number]  ONG#:[id number]   PCP: Kelsey Hy, MD SU: [name], M.D. RAD ONC: [name], M.D. NEURO:  [name], M.D.   HISTORY OF PRESENT ILLNESS: From Kelsey. Theron Arista [name]'s new patient evaluation note dated 01/05/2010:  "This woman has been in excellent health.  She has undergone annual screening mammography.  She underwent a routine screening mammogram on 12/11/2009, this showed a possible mass, right breast.  The patient was contacted and underwent a diagnostic mammogram and right breast ultrasound on 12/19/2009 at the time of the imaging tests, physical exam did not reveal any abnormalities.  There is focal tenderness at the 10 o' clock, 2 cm from the right nipple.  Ultrasound showed a hypoechoic mass with a microlobulated border at 10 o' clock, 2 cm from the nipple measuring 2 x 1.2 x 1.8 cm.  Ultrasound of that axilla did not reveal abnormal lymph nodes.  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  (The case [phone]).  The estrogen receptors were negative, progesterone receptor was negative, proliferative index 96% and the HER-2 had a ratio of 1.31.  An MRI scan was done 12/29/2009, which showed enhancing 1.8 x 2.3 x 2.4 cm mass in the left and upper quadrant.  No enlarged lymph nodes were seen.  Some sub-centimeter lesions were seen in the liver, likely representing cyst.  The patient has elected to undergo genetic testing and is awaiting those results prior to making a final decision regarding surgical therapy.  In addition, she is here to discuss neoadjuvant therapy.  This patient has a history of DCIS involving the left breast.  This is associated with microinvasion.  This was diagnosed in 1996.  She had left central lumpectomy and axillary node  dissection that was negative.  She was seen by [name], and underwent radiation therapy to the breast.  She has not received any adjuvant hormonal therapy.  We do not have any other details regarding the specific pathological diagnosis."    Her subsequent history is as detailed below.   INTERVAL HISTORY: [name] returns today for followup of her breast cancer accompanied by her husband [name]. Since her last visit here the patient underwent L2 kyphoplasty, with some relief of her back pain at that location. However she is experiencing a variety of medical issues and has had numerous falls in the past 2-3 months. Marland Kitchen REVIEW OF SYSTEMS: Apparently her blood pressure medications make her orthostatic. These have been at just did. She also has a history of seizures, which are moderately well-controlled on Keppra. However the Keppra can make her a little bit woozy, which again can contribute to the falls. Chest significant venous stasis changes and chronic lower extremity edema. This improved significantly with compression stockings, but currently the patient has blistering in both lower legs and is unable to wear her compression stockings. She is having a lot of pain and taking a lot of pain medicine which is severely constipating her. This also may be confusing her. In short, while each one of these problems by itself might not be so difficult to deal with, taken together they are severely affecting her functional status, and causing her to injure herself through repeated falls.  PAST MEDICAL HISTORY: Past Medical History  Diagnosis Date  . IBS (irritable bowel syndrome)   .  Fibromyalgia   . History of DVT (deep vein thrombosis)   . History of pulmonary embolism   . Osteoporosis   . History of breast cancer LEFT BREAST DCIS  1996  . Hyperlipidemia   . 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 Breast cancer RIGHT SIDE-- DX OCT 2011    CHEMORADIATION  THERAPY-- COMPLETED   . PONV (postoperative nausea and vomiting)   . Skin tear LEFT ARM COVERED W/ TEGADERM    PT STATES HX MULTIPLE SKIN TEARS -- THIN  . Thin skin SECONARDY AGE AND HX CHEMO  . Ecchymosis   . Seasonal allergies   . Frequency of urination   . Nocturia   . Anemia   . Lung mass PER [name] NOTE UNCLEAR IF LYMPHOMA FROM BX    FOLLOWED BY [name]  . Seizures   . Unspecified hereditary and idiopathic peripheral neuropathy   . Asthma   . Fibromyalgia   . Vertebral compression fracture 5/14    L4    PAST SURGICAL HISTORY: Past Surgical History  Procedure Laterality Date  . Total abdominal hysterectomy w/ bilateral salpingoophorectomy  1977  (APPROX)  . Cholecystectomy    . Appendectomy    . Femur fracture surgery  12-18-2010  [name]    INTRAMEDULLARY NAILING LEFT INTERTROCHANTERIC -SUBTROCHANTERIC FX  . Right breast lumpectomy / removal lymph nodes x2/ removal pac  10-01-2010    CARCINOMA RIGHT BREAST  . Placement port- a- cath  02-18-2010  . Bronchoscopy  01-29-2010    W/ BX  . Tvt vaginal tape  suburethral sling   06-08-2005    SUI  . Bilateral laminectomy/ foraminotomy  01-14-2004    L4 - L5  . Breast lumpectomy  1996     LEFT BREAST DCIS   . Cataract extraction w/ intraocular lens  implant, bilateral    . Bilateral carpal tunnel release    . Tonsillectomy    . Transthoracic echocardiogram  02-25-2010    LVSF NORMAL/ EF 55-60%/ GRADE I DIASTOLIC DYSFUNCTION/ MILDLY DILATED LEFT ATRIUM  . Hardware removal  10/11/2011    Procedure: HARDWARE REMOVAL;  Surgeon: Kelsey Docker, MD;  Location: [facility] [place];  Service: Orthopedics;  Laterality: Left;  LEFT THIGH REMOVAL OF DISTAL LOCKING SCREW NEEDS: FLORO, RADIO LUSCENT TABLE AND SCREWDRIVER FOR BIOMET ROD   . Colonoscopy    Includes history of hysterectomy 30 years ago with oophorectomy.  This is done by Kelsey. Laureen [name] by her report.  She had a large ovarian mass, which was diagnosed as possibly  a borderline tumor.  She had a cholecystectomy 20 years ago, appendectomy 50 years ago.  FAMILY HISTORY Family History  Problem Relation Age of Onset  . Emphysema Father   . Ovarian cancer Mother   . Cancer Mother     cervical  . Breast cancer Maternal Grandmother   . Cancer Sister     cervical  Both parents deceased.  Father died about age 40 of unknown causes.  Mother died over 5 years ago, the patient reports she had ovarian cancer as did the patient's sister with both ovarian and breast cancer.  The patient has a maternal aunt who had ovarian cancer.  Five maternal aunts, one had ovarian cancer and one had breast cancer, a niece had breast cancer.  GYNECOLOGIC HISTORY: Gravida 2, para 2, menarche age 50, menopause at the time of hysterectomy.  She does not recall her last bone density test.  SOCIAL HISTORY: The patient has been married for over 55 years.  She underwent husband [name] have two adult sons, 5 grandsons, and 2 great grandchildren.  The patient and her husband are both retired. 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  She has family [place]  Her daughter is a Engineer, civil (consulting) who works for [employer].  In her spare time the patient enjoys gardening, swimming, being with her family, and going to church.   ADVANCED DIRECTIVES: Not on file  HEALTH MAINTENANCE: History  Substance Use Topics  . Smoking status: Former Smoker -- 1.00 packs/day for 30 years    Types: Cigarettes    Quit date: 03/01/1978  . Smokeless tobacco: Never Used  . Alcohol Use: 4.2 oz/week    7 Glasses of wine per week    Colonoscopy:  PAP:  Bone density: The patient's last bone density examination on 06/21/2011 showed a T score of -2.8 (osteoporosis). Lipid panel: Not on file  Allergies  Allergen Reactions  . Penicillins Anaphylaxis  . Adhesive [Tape] Other (See Comments)    Blisters   . Doxycycline Nausea And  Vomiting  . Morphine And Related Other (See Comments)    HALLUCINATIONS    Current Outpatient Prescriptions  Medication Sig Dispense Refill  . dexlansoprazole (DEXILANT) 60 MG capsule Take 60 mg by mouth 2 (two) times daily.       . DULoxetine (CYMBALTA) 60 MG capsule Take 60 mg by mouth daily.      . ergocalciferol (VITAMIN D2) 50000 UNITS capsule Take 50,000 Units by mouth once a week. tuesday      . ezetimibe-simvastatin (VYTORIN) 10-80 MG per tablet Take 1 tablet by mouth at bedtime.       . gabapentin (NEURONTIN) 100 MG capsule Take 300 mg by mouth 2 (two) times daily.       Marland Kitchen HYDROmorphone (DILAUDID) 2 MG tablet Take 2 mg by mouth daily.      . hyoscyamine (LEVBID) 0.375 MG 12 hr tablet Take 1 tablet by mouth every 12 (twelve) hours.      . levETIRAcetam (KEPPRA) 500 MG tablet Take 1 tablet (500 mg total) by mouth 2 (two) times daily.  180 tablet  3  . levothyroxine (SYNTHROID, LEVOTHROID) 88 MCG tablet Take 88 mcg by mouth every morning.       Marland Kitchen LORazepam (ATIVAN) 0.5 MG tablet Take 1 tablet (0.5 mg total) by mouth every 8 (eight) hours as needed. Anxiety  30 tablet  0  . metroNIDAZOLE (METROGEL) 1 % gel Apply 1 application topically daily. Applied to face for rosacea.      Marland Kitchen MICRONIZED COLESTIPOL HCL 1 G tablet Take 1 tablet by mouth daily.      Marland Kitchen oxyCODONE-acetaminophen (PERCOCET) 5-325 MG per tablet Take 1 tablet by mouth every 4 (four) hours as needed.  120 tablet  0  . predniSONE (DELTASONE) 5 MG tablet Take 1 tablet (5 mg total) by mouth daily with breakfast.  14 tablet  0  . UCERIS 9 MG TB24 Take 1 capsule by mouth every 12 (twelve) hours.       No current facility-administered medications for this visit.   Facility-Administered Medications Ordered in Other Visits  Medication Dose Route Frequency Provider Last Rate Last Dose  . fentaNYL (SUBLIMAZE) injection 25-50 mcg  25-50 mcg Intravenous Q5 min PRN Gaetano Hawthorne, MD      . lactated ringers infusion   Intravenous  Continuous Leonette Most  Kandice Moos, MD         Objective: older white woman who appears chronically ill There were no vitals filed for this visit.   There is no weight on file to calculate BMI.      ECOG FS:  2           The patient walks with a "seat walker".  Pupils are equal and reactive and sclerae are nonicteric Oropharynx shows no thrush or other lesions There is no cervical or supraclavicular adenopathy Lungs show no crackles or wheezes, with fair excursion Heart regular rate and rhythm abdomen soft, obese, nontender, positive bowel sounds Musculoskeletal exam shows chronic venous stasis changes with hemosiderin deposits in both lower extremities, 2+ lower extremity edema, with blistering over the dorsal aspect of the left shin (4 x 6 cm blister) Breast exam: Status post right central lumpectomy and radiation, with no evidence of recurrent disease. The right axilla is benign. The left breast is unremarkable. Skin: Innumerable bruises over the left neck, both arms, and both lower extremities, with blisters as described above  LAB RESULTS: Lab Results  Component Value Date   WBC 9.2 01/04/2013   NEUTROABS 6.5 01/04/2013   HGB 11.8 01/04/2013   HCT 36.6 01/04/2013   MCV 94.0 01/04/2013   PLT 217 01/04/2013      Chemistry      Component Value Date/Time   NA 141 10/30/2012 1745   NA 137 07/04/2012 1456   K 4.7 10/30/2012 1745   K 3.3* 07/04/2012 1456   CL 101 10/30/2012 1745   CL 102 07/04/2012 1456   CO2 31 10/30/2012 1745   CO2 25 07/04/2012 1456   BUN 18 10/30/2012 1745   BUN 16.5 07/04/2012 1456   CREATININE 0.92 10/30/2012 1745   CREATININE 0.7 07/04/2012 1456      Component Value Date/Time   CALCIUM 9.6 10/30/2012 1745   CALCIUM 8.9 07/04/2012 1456   ALKPHOS 72 07/04/2012 1456   ALKPHOS 81 02/22/2012 1505   AST 20 07/04/2012 1456   AST 35 02/22/2012 1505   ALT 22 07/04/2012 1456   ALT 42* 02/22/2012 1505   BILITOT 0.52 07/04/2012 1456   BILITOT 0.4 02/22/2012 1505       Lab Results  Component  Value Date   LABCA2 35 01/05/2010    Urinalysis    Component Value Date/Time   COLORURINE AMBER* 02/22/2012 1507   APPEARANCEUR CLEAR 02/22/2012 1507   LABSPEC 1.026 02/22/2012 1507   PHURINE 6.0 02/22/2012 1507   GLUCOSEU NEGATIVE 02/22/2012 1507   HGBUR TRACE* 02/22/2012 1507   BILIRUBINUR SMALL* 02/22/2012 1507   KETONESUR TRACE* 02/22/2012 1507   PROTEINUR NEGATIVE 02/22/2012 1507   UROBILINOGEN 0.2 02/22/2012 1507   NITRITE NEGATIVE 02/22/2012 1507   LEUKOCYTESUR NEGATIVE 02/22/2012 1507    STUDIES: PET SKULL BASE TO THIGH Mar 25, 2012 Fasting Blood Glucose: 98  Technique: 18.3 mCi F-18 FDG was injected intravenously. CT data  was obtained and used for attenuation correction and anatomic  localization only. (This was not acquired as a diagnostic CT  examination.) Additional exam technical data entered on  technologist worksheet.  Comparison: 01/20/2010  Findings:  Neck: There is asymmetric increased uptake within the opacified  left maxillary sinus. No hypermetabolic mass or adenopathy.  Chest: No hypermetabolic mediastinal or hilar nodes. No  suspicious pulmonary nodules on the CT scan.  Abdomen/Pelvis: No abnormal hypermetabolic activity within the  liver, pancreas, adrenal glands, or spleen. No hypermetabolic  lymph nodes in the abdomen or  pelvis.  Skeleton: There are multiple right anterior second, third and  fourth rib fractures are noted with associated increased FDG  uptake.  IMPRESSION:  1. There are no specific features identified to suggest residual  or recurrence of breast cancer.  2. Right anterior rib fractures.  3. Left maxillary sinus inflammation.    CT ABDOMEN WITHOUT AND WITH CONTRAST 08/07/2012 Technique: Multidetector CT imaging of the abdomen was performed  following the standard protocol before and during bolus  administration of intravenous contrast.  Contrast: OMNIPAQUE IOHEXOL 300 MG/ML SOLN  Comparison: 03/14/2012 PET. 02/22/2012  abdomen pelvic CT.  Findings: Lung bases: Centrilobular emphysema. Similar right  sided interstitial thickening, nonspecific.  Mild cardiomegaly with coronary artery atherosclerosis. Tiny  hiatal hernia. No pericardial or pleural effusion.  Abdomen: No calcified lesions on unenhanced imaging.  No hypervascular lesions identified within the liver, pancreas, or  kidneys on arterial phase images.  Similar appearance of scattered low density liver lesions, likely  cysts and possibly bile duct hamartomas. No suspicious liver  lesion identified. Similar borderline intrahepatic biliary ductal  dilatation.  Normal spleen, stomach, pancreas. Descending duodenal  diverticulum. Common duct similar and within normal limits after  cholecystectomy; 9 mm.  Normal appearance of the adrenal glands, without evidence of mass  or thickening.  Too small to characterize interpolar right renal lesion. Normal  appearance of the left kidney for age. There is a partially  duplicated left renal collecting system. No retroperitoneal or  retrocrural adenopathy.  Normal abdominal bowel loops. Prior left mastectomy and breast  reconstruction.  Bones/Musculoskeletal: Mild osteopenia. Nonacute anterior right  rib fracture. Compression deformities at T12 and L1 are not  significantly changed. Mild L4 compression deformity is also  similar.  IMPRESSION:  1. Normal appearance of the adrenal glands.  2. No suspicious renal mass.  3. Stable low density liver lesions, favored to be cysts and bile  duct hamartomas.   CERVICAL SPINE - COMPLETE 4+ VIEW  10/30/2012 Comparison: None  Findings: Normal alignment is noted.  There is no evidence of fracture, subluxation or prevertebral soft  tissue swelling.  Mild multilevel degenerative disc disease, spondylosis and facet  arthropathy noted throughout the cervical spine.  Mild right bony foraminal narrowing at C3-C4 noted.  No focal bony lesions are present.   IMPRESSION:  No static evidence of acute injury to the cervical spine.  Mild multilevel degenerative changes.    MRI LUMBAR SPINE WITHOUT AND WITH CONTRAST 10/31/2012 Technique: Multiplanar and multiecho pulse sequences of the lumbar  spine were obtained without and with intravenous contrast.  Contrast: 16mL MULTIHANCE GADOBENATE DIMEGLUMINE 529 MG/ML IV SOLN  Comparison: Radiographs dated 10/30/2012 and CT scans dated 06/09  and 07/26/2012 and 02/22/2012  Findings: Normal appearing conus tip at L1. Normal paraspinal soft  tissues.  There is an acute or subacute slight compression fracture of the  superior endplate of L2 without protrusion of disc or bone material  into the spinal canal. This does not appear to be a pathologic  fracture.  There is an old slight compression fracture of the superior  endplate of L4. There is a small Schmorl's node in the inferior  endplate of T12.  [id number] through L2-3: No significant abnormality of the discs.  Minimal degenerative changes of the facet joints at L2-3.  L3-4: Hypertrophy of the ligamenta flava and facets create  moderate spinal stenosis without focal neural impingement. The  disc bulges into both neural foramina without neural impingement.  L4-5: 4 mm spondylolisthesis  with a broad-based disc bulge with a  small protrusion into the left neural foramen without neural  impingement. There is severe bilateral facet arthritis. Previous  right laminectomy. No spinal stenosis.  L5 S1: Slight bilateral facet arthritis. Normal disc.  IMPRESSION:  1. Benign-appearing slight compression fracture of the superior  endplate of L2 with no neural impingement.  2. Moderate spinal stenosis at L3-4 due to posterior element  hypertrophy.  Original Report Authenticated By: Francene Boyers, M.D.    MRI THORACIC SPINE WITHOUT CONTRAST 11/25/2012 TECHNIQUE:  Multiplanar, multisequence MR imaging was performed. No intravenous  contrast was administered.   COMPARISON: Lumbar MRI 10/31/2012. PET-CT 03/14/2012. Chest CT  07/31/2010.  FINDINGS:  Limited sagittal imaging of the cervical spine Within normal limits  for age.  Bone marrow signal in the cervical and thoracic spine within normal  limits. There is diffusely abnormal marrow signal in the partially  visible L2 vertebral body, adjacent to bone cement with dark MRI  signal (series 6, image 9). There is also new mild marrow edema at  the anterior inferior L1 vertebral body (see also series 7, image  8). But no interval loss of L1 vertebral body height.  Stable thoracic vertebral height and alignment, including wedging  and endplate irregularity in the lower thoracic spine.  Fairly capacious thoracic spinal canal. No spinal stenosis, although  there are occasional small disc protrusions including centrally at  T7-T8.  Spinal cord signal is within normal limits at all visualized levels.  Conus medullaris at T12-L1. Thoracic posterior paraspinal soft  tissues are within normal limits for age. Negative visualized  thoracic viscera. Stable visualized upper abdominal viscera.  IMPRESSION:  1. Partially visible sequelae of L2 vertebral body augmentation  status post compression fracture. Continued L2 vertebral edema.  2. New L1 anterior inferior endplate marrow edema might be reactive  or reflect contusion of the bone. No overt L1 compression fracture  at this time.  3. No acute osseous abnormality in the thoracic spine. No thoracic  spinal stenosis.  Electronically Signed  By: Augusto Gamble M.D.    ASSESSMENT: 78 y.o. BRCA negative [place] woman:  1.  History of left breast DCIS in 1996  2.  Status post right needle core biopsy on 12/19/2009 which showed a high-grade invasive ductal carcinoma with papillary features and high-grade DCIS, ER 0%, PR 0%, Ki-67 96%, HER-2/neu negative by CISH with a ratio of 1.31.  3.  Status post bilateral breast MRI on 12/29/2009 which showed an  enhancing 1.8 x 2.3 x 2.4 cm mass in the upper outer quadrant of the right breast.  No additional masses were seen in the right breast.  There were no enlarged axillary or internal mammary adenopathy.  No abnormal enhancement was seen in the left breast.  4.  The patient underwent genetic counseling and testing and had a negative BRCA1 and BRCA2 gene mutation letter sent to her dated 01/09/2010.  5.  Status post right lower lobe pulmonary biopsy on 02/10/2010 that was negative for malignancy.  6.  Status post 2-D echocardiogram on 02/25/2010 which showed an ejection fraction of 55% to 60%.  7.  Status post neoadjuvant chemotherapy with dose dense FEC x 3 cycles from 03/12/2010 through 04/17/2010 with Neulasta support.  8.  Status post neoadjuvant chemotherapy with FEC x 1 dose on 05/08/2010 with Neulasta support.  9.  Status post neoadjuvant chemotherapy with Abraxane x 9 doses from 06/03/2010 through 08/12/2010.  10.  Status post right lumpectomy with sentinel node biopsy  10/01/2010 which showed a stage 0, ypTis ypN0, high-grade 0.45 cm DCIS, ER 0%, PR 0%, margins were negative for carcinoma, 0/2 positive lymph nodes.  12.  Status post radiation therapy from 11/18/2010 through 01/12/2011.  13.  The patient had a PET scan on 03/14/2012 which showed no specific features identified to suggest residual or recurrence of breast cancer, right anterior rib fractures, left maxillary sinus inflammation.  14.  History of hypokalemia  15.  Anxiety  16.  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 This means she had an excellent response and has a very good prognosis as far as her breast cancer is concerned. In confirmation of that, she has had multiple studies did this year (which I have paced it above) including a whole body PET scan, a CT of the abdomen and  pelvis, and films of her entire spine from the neck to the tailbone, showing no evidence of recurrent or metastatic disease. Accordingly, as far as her breast cancer is concerned, she will return to see [place] in 6 months.  Unfortunately she has a variety of other problems which are combining to make her life miserable. The blood pressure medicine and make sure orthostatic. The antiseizure medicine makes her a little confused. She has venous insufficiency which causes swelling and that may be what is causing her blistering, or she may be having a reaction to one of her numerous medications, or she may have a primary bullous disease.  Since these problems are not going to go away overnight, the one thing I urged her to do was to bond to her wheelchair. She needs to get assistance from her husband, who is very adequate as a caregiver, to get from bed to wheelchair, wheelchair to commode, and commode wheelchair. The doors to the bathroom in their house her 2 marrow for the wheelchair. Accordingly they really need to pull down the frame of the door and widened the door. She should be in the wheelchair all day and she should be in the wheelchair when she goes out of the house. If she does Ativan at the very least she will not fall. She can then start to work on her other problems.  The patient is interested in obtaining a dermatologic opinion regarding her to your leave a blistering in her left lower extremity (she swears there was no trauma there; there is venous stasis and edema) and the patient's husband sees Kelsey. Yetta Barre, so they will arrange for a visit there. I discussed Munachimso's case with Kelsey. [name], her primary care physician, today, and he is very well aware of all these issues, which his office has been managing. He is in agreement with her staying in a wheelchair until her overall functional status improved and the fall precautions can be lifted.  The patient knows to call for any problems that may develop  before her next visit here.     Lowella Dell, MD  01/04/2013, 2:08 PM

## 2013-01-05 LAB — VITAMIN D 25 HYDROXY (VIT D DEFICIENCY, FRACTURES): Vit D, 25-Hydroxy: 45 ng/mL (ref 30–89)

## 2013-01-12 DIAGNOSIS — I8310 Varicose veins of unspecified lower extremity with inflammation: Secondary | ICD-10-CM | POA: Diagnosis not present

## 2013-01-12 DIAGNOSIS — L98499 Non-pressure chronic ulcer of skin of other sites with unspecified severity: Secondary | ICD-10-CM | POA: Diagnosis not present

## 2013-01-16 ENCOUNTER — Encounter (HOSPITAL_COMMUNITY): Payer: Self-pay | Admitting: Anesthesiology

## 2013-01-18 DIAGNOSIS — M545 Low back pain: Secondary | ICD-10-CM | POA: Diagnosis not present

## 2013-01-19 ENCOUNTER — Encounter (HOSPITAL_BASED_OUTPATIENT_CLINIC_OR_DEPARTMENT_OTHER): Payer: Medicare Other | Attending: General Surgery

## 2013-01-19 DIAGNOSIS — I872 Venous insufficiency (chronic) (peripheral): Secondary | ICD-10-CM | POA: Insufficient documentation

## 2013-01-19 DIAGNOSIS — L97309 Non-pressure chronic ulcer of unspecified ankle with unspecified severity: Secondary | ICD-10-CM | POA: Diagnosis not present

## 2013-01-19 DIAGNOSIS — L97209 Non-pressure chronic ulcer of unspecified calf with unspecified severity: Secondary | ICD-10-CM | POA: Diagnosis not present

## 2013-01-19 DIAGNOSIS — Z853 Personal history of malignant neoplasm of breast: Secondary | ICD-10-CM | POA: Insufficient documentation

## 2013-01-20 NOTE — Progress Notes (Signed)
[facility]  NAME:  [name], [name]             ACCOUNT NO.:  [id number]  MEDICAL RECORD NO.:  [id number]      DATE OF BIRTH:  May 23, 1934  PHYSICIAN:  Ardath Sax, M.D.      VISIT DATE:  01/19/2013                                  OFFICE VISIT   A 78 year old, moderately obese female, who comes to [place] with bilateral venous stasis ulcers that have a lot of necrotic material.  I debrided them with a curette and got a lot of this material out.  They are significantly enlarged on the calf and the ankle of both of her legs. The areas are 9 x 6 cm on the left, and 1 x 1 on the right.  The patient has a history of breast cancer a couple of years ago, but apparently has recovered from that very nicely.  She also has a history of being on prednisone, oxycodone, Valium, hydromorphone, gabapentin, Lasix.  She came to [place] today and was found to have a blood pressure of 123/78, respirations 18, pulse 80, temperature 98.  She weighs 175 pounds.  The wound as I stated was treated by curetting and then we wrapped her in Unna boots and I used Santyl.  She will come back in a week.  So her diagnosis is venous stasis ulcers, bilateral legs.     Ardath Sax, M.D.     PP/MEDQ  D:  01/19/2013  T:  01/20/2013  Job:  [id number]

## 2013-01-24 DIAGNOSIS — I872 Venous insufficiency (chronic) (peripheral): Secondary | ICD-10-CM | POA: Diagnosis not present

## 2013-01-24 DIAGNOSIS — Z853 Personal history of malignant neoplasm of breast: Secondary | ICD-10-CM | POA: Diagnosis not present

## 2013-01-24 DIAGNOSIS — L97309 Non-pressure chronic ulcer of unspecified ankle with unspecified severity: Secondary | ICD-10-CM | POA: Diagnosis not present

## 2013-01-24 DIAGNOSIS — L97209 Non-pressure chronic ulcer of unspecified calf with unspecified severity: Secondary | ICD-10-CM | POA: Diagnosis not present

## 2013-01-29 ENCOUNTER — Other Ambulatory Visit: Payer: Self-pay | Admitting: Oncology

## 2013-01-31 ENCOUNTER — Encounter (HOSPITAL_BASED_OUTPATIENT_CLINIC_OR_DEPARTMENT_OTHER): Payer: Medicare Other | Attending: General Surgery

## 2013-01-31 DIAGNOSIS — I87319 Chronic venous hypertension (idiopathic) with ulcer of unspecified lower extremity: Secondary | ICD-10-CM | POA: Diagnosis not present

## 2013-01-31 DIAGNOSIS — L97909 Non-pressure chronic ulcer of unspecified part of unspecified lower leg with unspecified severity: Secondary | ICD-10-CM | POA: Insufficient documentation

## 2013-02-14 DIAGNOSIS — I87319 Chronic venous hypertension (idiopathic) with ulcer of unspecified lower extremity: Secondary | ICD-10-CM | POA: Diagnosis not present

## 2013-02-21 DIAGNOSIS — I87319 Chronic venous hypertension (idiopathic) with ulcer of unspecified lower extremity: Secondary | ICD-10-CM | POA: Diagnosis not present

## 2013-02-28 DIAGNOSIS — I87319 Chronic venous hypertension (idiopathic) with ulcer of unspecified lower extremity: Secondary | ICD-10-CM | POA: Diagnosis not present

## 2013-03-07 ENCOUNTER — Encounter (HOSPITAL_BASED_OUTPATIENT_CLINIC_OR_DEPARTMENT_OTHER): Payer: Medicare Other | Attending: General Surgery

## 2013-03-07 DIAGNOSIS — L97909 Non-pressure chronic ulcer of unspecified part of unspecified lower leg with unspecified severity: Principal | ICD-10-CM

## 2013-03-07 DIAGNOSIS — I87339 Chronic venous hypertension (idiopathic) with ulcer and inflammation of unspecified lower extremity: Secondary | ICD-10-CM | POA: Diagnosis not present

## 2013-03-09 DIAGNOSIS — E2749 Other adrenocortical insufficiency: Secondary | ICD-10-CM | POA: Diagnosis not present

## 2013-03-09 DIAGNOSIS — E041 Nontoxic single thyroid nodule: Secondary | ICD-10-CM | POA: Diagnosis not present

## 2013-03-09 DIAGNOSIS — R269 Unspecified abnormalities of gait and mobility: Secondary | ICD-10-CM | POA: Diagnosis not present

## 2013-03-09 DIAGNOSIS — G609 Hereditary and idiopathic neuropathy, unspecified: Secondary | ICD-10-CM | POA: Diagnosis not present

## 2013-03-09 DIAGNOSIS — E785 Hyperlipidemia, unspecified: Secondary | ICD-10-CM | POA: Diagnosis not present

## 2013-03-09 DIAGNOSIS — C50919 Malignant neoplasm of unspecified site of unspecified female breast: Secondary | ICD-10-CM | POA: Diagnosis not present

## 2013-03-09 DIAGNOSIS — S32009A Unspecified fracture of unspecified lumbar vertebra, initial encounter for closed fracture: Secondary | ICD-10-CM | POA: Diagnosis not present

## 2013-03-09 DIAGNOSIS — M546 Pain in thoracic spine: Secondary | ICD-10-CM | POA: Diagnosis not present

## 2013-03-09 DIAGNOSIS — Z853 Personal history of malignant neoplasm of breast: Secondary | ICD-10-CM | POA: Diagnosis not present

## 2013-03-09 DIAGNOSIS — I10 Essential (primary) hypertension: Secondary | ICD-10-CM | POA: Diagnosis not present

## 2013-03-10 ENCOUNTER — Inpatient Hospital Stay (HOSPITAL_COMMUNITY)
Admission: EM | Admit: 2013-03-10 | Discharge: 2013-03-13 | DRG: 384 | Disposition: A | Discharge status: Home / Self Care | Payer: Medicare Other | Attending: Endocrinology | Admitting: Endocrinology

## 2013-03-10 ENCOUNTER — Emergency Department (HOSPITAL_COMMUNITY): Payer: Medicare Other

## 2013-03-10 ENCOUNTER — Encounter (HOSPITAL_COMMUNITY): Payer: Self-pay | Admitting: Emergency Medicine

## 2013-03-10 DIAGNOSIS — Z86711 Personal history of pulmonary embolism: Secondary | ICD-10-CM

## 2013-03-10 DIAGNOSIS — G609 Hereditary and idiopathic neuropathy, unspecified: Secondary | ICD-10-CM | POA: Diagnosis present

## 2013-03-10 DIAGNOSIS — M810 Age-related osteoporosis without current pathological fracture: Secondary | ICD-10-CM | POA: Diagnosis present

## 2013-03-10 DIAGNOSIS — K922 Gastrointestinal hemorrhage, unspecified: Secondary | ICD-10-CM | POA: Diagnosis not present

## 2013-03-10 DIAGNOSIS — D62 Acute posthemorrhagic anemia: Secondary | ICD-10-CM | POA: Diagnosis present

## 2013-03-10 DIAGNOSIS — G40909 Epilepsy, unspecified, not intractable, without status epilepticus: Secondary | ICD-10-CM | POA: Diagnosis present

## 2013-03-10 DIAGNOSIS — A498 Other bacterial infections of unspecified site: Secondary | ICD-10-CM | POA: Diagnosis present

## 2013-03-10 DIAGNOSIS — IMO0002 Reserved for concepts with insufficient information to code with codable children: Secondary | ICD-10-CM

## 2013-03-10 DIAGNOSIS — Z853 Personal history of malignant neoplasm of breast: Secondary | ICD-10-CM | POA: Diagnosis not present

## 2013-03-10 DIAGNOSIS — R0602 Shortness of breath: Secondary | ICD-10-CM | POA: Diagnosis not present

## 2013-03-10 DIAGNOSIS — F3289 Other specified depressive episodes: Secondary | ICD-10-CM | POA: Diagnosis present

## 2013-03-10 DIAGNOSIS — Z87891 Personal history of nicotine dependence: Secondary | ICD-10-CM

## 2013-03-10 DIAGNOSIS — K2900 Acute gastritis without bleeding: Secondary | ICD-10-CM | POA: Diagnosis present

## 2013-03-10 DIAGNOSIS — Z803 Family history of malignant neoplasm of breast: Secondary | ICD-10-CM

## 2013-03-10 DIAGNOSIS — G608 Other hereditary and idiopathic neuropathies: Secondary | ICD-10-CM | POA: Diagnosis present

## 2013-03-10 DIAGNOSIS — N289 Disorder of kidney and ureter, unspecified: Secondary | ICD-10-CM | POA: Diagnosis not present

## 2013-03-10 DIAGNOSIS — K449 Diaphragmatic hernia without obstruction or gangrene: Secondary | ICD-10-CM | POA: Diagnosis present

## 2013-03-10 DIAGNOSIS — R079 Chest pain, unspecified: Secondary | ICD-10-CM | POA: Diagnosis not present

## 2013-03-10 DIAGNOSIS — Z79899 Other long term (current) drug therapy: Secondary | ICD-10-CM

## 2013-03-10 DIAGNOSIS — A048 Other specified bacterial intestinal infections: Secondary | ICD-10-CM | POA: Diagnosis not present

## 2013-03-10 DIAGNOSIS — F329 Major depressive disorder, single episode, unspecified: Secondary | ICD-10-CM | POA: Diagnosis present

## 2013-03-10 DIAGNOSIS — K259 Gastric ulcer, unspecified as acute or chronic, without hemorrhage or perforation: Principal | ICD-10-CM | POA: Diagnosis present

## 2013-03-10 DIAGNOSIS — M353 Polymyalgia rheumatica: Secondary | ICD-10-CM | POA: Diagnosis present

## 2013-03-10 DIAGNOSIS — E2749 Other adrenocortical insufficiency: Secondary | ICD-10-CM | POA: Diagnosis present

## 2013-03-10 DIAGNOSIS — K921 Melena: Secondary | ICD-10-CM | POA: Diagnosis not present

## 2013-03-10 DIAGNOSIS — Z880 Allergy status to penicillin: Secondary | ICD-10-CM

## 2013-03-10 DIAGNOSIS — F411 Generalized anxiety disorder: Secondary | ICD-10-CM | POA: Diagnosis present

## 2013-03-10 DIAGNOSIS — Z885 Allergy status to narcotic agent status: Secondary | ICD-10-CM

## 2013-03-10 DIAGNOSIS — J45909 Unspecified asthma, uncomplicated: Secondary | ICD-10-CM | POA: Diagnosis present

## 2013-03-10 DIAGNOSIS — E2740 Unspecified adrenocortical insufficiency: Secondary | ICD-10-CM | POA: Diagnosis present

## 2013-03-10 DIAGNOSIS — K31819 Angiodysplasia of stomach and duodenum without bleeding: Secondary | ICD-10-CM | POA: Diagnosis present

## 2013-03-10 DIAGNOSIS — K2960 Other gastritis without bleeding: Secondary | ICD-10-CM | POA: Diagnosis not present

## 2013-03-10 DIAGNOSIS — M549 Dorsalgia, unspecified: Secondary | ICD-10-CM | POA: Diagnosis present

## 2013-03-10 DIAGNOSIS — IMO0001 Reserved for inherently not codable concepts without codable children: Secondary | ICD-10-CM | POA: Diagnosis present

## 2013-03-10 DIAGNOSIS — R2681 Unsteadiness on feet: Secondary | ICD-10-CM | POA: Diagnosis present

## 2013-03-10 DIAGNOSIS — K589 Irritable bowel syndrome without diarrhea: Secondary | ICD-10-CM | POA: Diagnosis present

## 2013-03-10 DIAGNOSIS — E46 Unspecified protein-calorie malnutrition: Secondary | ICD-10-CM | POA: Diagnosis present

## 2013-03-10 DIAGNOSIS — K2970 Gastritis, unspecified, without bleeding: Secondary | ICD-10-CM | POA: Diagnosis not present

## 2013-03-10 DIAGNOSIS — E785 Hyperlipidemia, unspecified: Secondary | ICD-10-CM | POA: Diagnosis present

## 2013-03-10 DIAGNOSIS — Z9221 Personal history of antineoplastic chemotherapy: Secondary | ICD-10-CM

## 2013-03-10 DIAGNOSIS — R269 Unspecified abnormalities of gait and mobility: Secondary | ICD-10-CM | POA: Diagnosis present

## 2013-03-10 DIAGNOSIS — I10 Essential (primary) hypertension: Secondary | ICD-10-CM | POA: Diagnosis present

## 2013-03-10 DIAGNOSIS — E039 Hypothyroidism, unspecified: Secondary | ICD-10-CM | POA: Diagnosis present

## 2013-03-10 DIAGNOSIS — D649 Anemia, unspecified: Secondary | ICD-10-CM | POA: Diagnosis not present

## 2013-03-10 DIAGNOSIS — Z8041 Family history of malignant neoplasm of ovary: Secondary | ICD-10-CM

## 2013-03-10 DIAGNOSIS — E860 Dehydration: Secondary | ICD-10-CM | POA: Diagnosis present

## 2013-03-10 DIAGNOSIS — M8440XA Pathological fracture, unspecified site, initial encounter for fracture: Secondary | ICD-10-CM | POA: Diagnosis present

## 2013-03-10 DIAGNOSIS — Z9109 Other allergy status, other than to drugs and biological substances: Secondary | ICD-10-CM

## 2013-03-10 DIAGNOSIS — R52 Pain, unspecified: Secondary | ICD-10-CM | POA: Diagnosis not present

## 2013-03-10 DIAGNOSIS — Z881 Allergy status to other antibiotic agents status: Secondary | ICD-10-CM

## 2013-03-10 DIAGNOSIS — N390 Urinary tract infection, site not specified: Secondary | ICD-10-CM | POA: Diagnosis present

## 2013-03-10 DIAGNOSIS — T3995XA Adverse effect of unspecified nonopioid analgesic, antipyretic and antirheumatic, initial encounter: Secondary | ICD-10-CM | POA: Diagnosis present

## 2013-03-10 LAB — URINE MICROSCOPIC-ADD ON

## 2013-03-10 LAB — CBC
HCT: 32.2 % — ABNORMAL LOW (ref 36.0–46.0)
HEMOGLOBIN: 10.6 g/dL — AB (ref 12.0–15.0)
MCH: 30.1 pg (ref 26.0–34.0)
MCHC: 32.9 g/dL (ref 30.0–36.0)
MCV: 91.5 fL (ref 78.0–100.0)
Platelets: 212 10*3/uL (ref 150–400)
RBC: 3.52 MIL/uL — ABNORMAL LOW (ref 3.87–5.11)
RDW: 14.3 % (ref 11.5–15.5)
WBC: 8.7 10*3/uL (ref 4.0–10.5)

## 2013-03-10 LAB — COMPREHENSIVE METABOLIC PANEL
ALT: 8 U/L (ref 0–35)
AST: 10 U/L (ref 0–37)
Albumin: 2.8 g/dL — ABNORMAL LOW (ref 3.5–5.2)
Alkaline Phosphatase: 92 U/L (ref 39–117)
BILIRUBIN TOTAL: 0.5 mg/dL (ref 0.3–1.2)
BUN: 23 mg/dL (ref 6–23)
CALCIUM: 8.8 mg/dL (ref 8.4–10.5)
CHLORIDE: 99 meq/L (ref 96–112)
CO2: 25 meq/L (ref 19–32)
CREATININE: 1.32 mg/dL — AB (ref 0.50–1.10)
GFR, EST AFRICAN AMERICAN: 44 mL/min — AB (ref >90)
GFR, EST NON AFRICAN AMERICAN: 38 mL/min — AB (ref >90)
GLUCOSE: 80 mg/dL (ref 70–99)
Potassium: 4 mEq/L (ref 3.7–5.3)
Sodium: 137 mEq/L (ref 137–147)
Total Protein: 6.1 g/dL (ref 6.0–8.3)

## 2013-03-10 LAB — URINALYSIS, ROUTINE W REFLEX MICROSCOPIC
Bilirubin Urine: NEGATIVE
GLUCOSE, UA: NEGATIVE mg/dL
KETONES UR: NEGATIVE mg/dL
Nitrite: POSITIVE — AB
PROTEIN: NEGATIVE mg/dL
Specific Gravity, Urine: 1.009 (ref 1.005–1.030)
UROBILINOGEN UA: 0.2 mg/dL (ref 0.0–1.0)
pH: 6 (ref 5.0–8.0)

## 2013-03-10 LAB — CBC WITH DIFFERENTIAL/PLATELET
Basophils Absolute: 0 10*3/uL (ref 0.0–0.1)
Basophils Relative: 0 % (ref 0–1)
EOS PCT: 1 % (ref 0–5)
Eosinophils Absolute: 0.1 10*3/uL (ref 0.0–0.7)
HEMATOCRIT: 33.7 % — AB (ref 36.0–46.0)
HEMOGLOBIN: 10.7 g/dL — AB (ref 12.0–15.0)
LYMPHS ABS: 2 10*3/uL (ref 0.7–4.0)
LYMPHS PCT: 18 % (ref 12–46)
MCH: 29.6 pg (ref 26.0–34.0)
MCHC: 31.8 g/dL (ref 30.0–36.0)
MCV: 93.1 fL (ref 78.0–100.0)
MONO ABS: 1.5 10*3/uL — AB (ref 0.1–1.0)
MONOS PCT: 14 % — AB (ref 3–12)
NEUTROS ABS: 7.4 10*3/uL (ref 1.7–7.7)
Neutrophils Relative %: 67 % (ref 43–77)
Platelets: 220 10*3/uL (ref 150–400)
RBC: 3.62 MIL/uL — ABNORMAL LOW (ref 3.87–5.11)
RDW: 14.2 % (ref 11.5–15.5)
WBC: 11 10*3/uL — AB (ref 4.0–10.5)

## 2013-03-10 LAB — PROTIME-INR
INR: 0.98 (ref 0.00–1.49)
PROTHROMBIN TIME: 12.8 s (ref 11.6–15.2)

## 2013-03-10 LAB — TYPE AND SCREEN
ABO/RH(D): O POS
Antibody Screen: NEGATIVE

## 2013-03-10 LAB — MRSA PCR SCREENING: MRSA by PCR: NEGATIVE

## 2013-03-10 LAB — LIPASE, BLOOD: Lipase: 17 U/L (ref 11–59)

## 2013-03-10 LAB — TROPONIN I: Troponin I: <0.3 ng/mL (ref <0.30)

## 2013-03-10 MED ORDER — SODIUM CHLORIDE 0.9 % IV SOLN
80.0000 mg | Freq: Once | INTRAVENOUS | Status: AC
  Administered 2013-03-10: 80 mg via INTRAVENOUS
  Filled 2013-03-10: qty 80

## 2013-03-10 MED ORDER — HYDROMORPHONE HCL PF 1 MG/ML IJ SOLN
0.5000 mg | INTRAMUSCULAR | Status: DC | PRN
  Administered 2013-03-10 – 2013-03-11 (×2): 0.5 mg via INTRAVENOUS
  Filled 2013-03-10 (×2): qty 1

## 2013-03-10 MED ORDER — LEVETIRACETAM 500 MG PO TABS
500.0000 mg | ORAL_TABLET | Freq: Two times a day (BID) | ORAL | Status: DC
  Administered 2013-03-10 – 2013-03-13 (×6): 500 mg via ORAL
  Filled 2013-03-10 (×7): qty 1

## 2013-03-10 MED ORDER — ONDANSETRON HCL 4 MG/2ML IJ SOLN
4.0000 mg | Freq: Four times a day (QID) | INTRAMUSCULAR | Status: DC | PRN
  Administered 2013-03-11: 4 mg via INTRAVENOUS
  Filled 2013-03-10: qty 2

## 2013-03-10 MED ORDER — HYDROMORPHONE HCL PF 1 MG/ML IJ SOLN
0.5000 mg | Freq: Once | INTRAMUSCULAR | Status: AC
  Administered 2013-03-10: 0.5 mg via INTRAVENOUS
  Filled 2013-03-10: qty 1

## 2013-03-10 MED ORDER — SODIUM CHLORIDE 0.9 % IV SOLN
INTRAVENOUS | Status: DC
  Administered 2013-03-10 – 2013-03-11 (×2): via INTRAVENOUS

## 2013-03-10 MED ORDER — SODIUM CHLORIDE 0.9 % IV SOLN
8.0000 mg/h | INTRAVENOUS | Status: DC
  Administered 2013-03-10 – 2013-03-11 (×3): 8 mg/h via INTRAVENOUS
  Filled 2013-03-10 (×6): qty 80

## 2013-03-10 MED ORDER — SODIUM CHLORIDE 0.9 % IV BOLUS (SEPSIS)
1000.0000 mL | INTRAVENOUS | Status: AC
  Administered 2013-03-10: 1000 mL via INTRAVENOUS

## 2013-03-10 MED ORDER — METHYLPREDNISOLONE SODIUM SUCC 40 MG IJ SOLR
40.0000 mg | INTRAMUSCULAR | Status: DC
  Administered 2013-03-10: 40 mg via INTRAVENOUS
  Filled 2013-03-10 (×2): qty 1

## 2013-03-10 MED ORDER — HYDRALAZINE HCL 20 MG/ML IJ SOLN
25.0000 mg | Freq: Two times a day (BID) | INTRAMUSCULAR | Status: DC
  Administered 2013-03-10: 25 mg via INTRAVENOUS
  Filled 2013-03-10 (×2): qty 1.25
  Filled 2013-03-10: qty 2

## 2013-03-10 MED ORDER — SODIUM CHLORIDE 0.9 % IJ SOLN
3.0000 mL | Freq: Two times a day (BID) | INTRAMUSCULAR | Status: DC
  Administered 2013-03-12: 3 mL via INTRAVENOUS

## 2013-03-10 MED ORDER — PANTOPRAZOLE SODIUM 40 MG IV SOLR: 40.0000 mg | Freq: Two times a day (BID) | INTRAVENOUS | Status: DC

## 2013-03-10 NOTE — Consult Note (Addendum)
Referring Provider: Dr. Virgina Jock Primary Care Physician:  Sheela Stack, MD Primary Gastroenterologist:  Dr. Watt Climes  Reason for Consultation:  GI bleed  HPI: [name] is a 79 y.o. female who had the acute onset of coffee grounds emesis (desribed as a large volume by her husband that nearly filled a basin at home) and multiple episodes of melena that started simultaneously at 2AM this morning. Denies any abdominal pain prior to the GI bleeding but has been nauseous for the past 3 weeks. Felt somewhat weak today. Denies hematochezia or bright red blood with the vomitus. Has taken Mobic chronically as well as Prednisone (for PMR). Denies any previous episodes of bleeding. Has a history of chronic diarrhea and a colonoscopy in 2013 showed microscopic colitis. Also has a history of IBS. Hgb 10.6 (11.8 in November 2014). She denies any melena since coming to the hospital and denies any coffee grounds emesis since 2AM. Small amount of melena noted on ER exam. History of breast cancer and last chemo in 2012.      Past Medical History  Diagnosis Date  . IBS (irritable bowel syndrome)   . Fibromyalgia   . History of DVT (deep vein thrombosis)   . History of pulmonary embolism   . Osteoporosis   . History of breast cancer LEFT BREAST DCIS  1996  . Hyperlipidemia   . 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 Breast cancer RIGHT SIDE-- DX OCT 2011    CHEMORADIATION THERAPY-- COMPLETED   . PONV (postoperative nausea and vomiting)   . Skin tear LEFT ARM COVERED W/ TEGADERM    PT STATES HX MULTIPLE SKIN TEARS -- THIN  . Thin skin SECONARDY AGE AND HX CHEMO  . Ecchymosis   . Seasonal allergies   . Frequency of urination   . Nocturia   . Anemia   . Lung mass PER DR CLANCE NOTE UNCLEAR IF LYMPHOMA FROM BX    FOLLOWED BY DR RUBIN  . Seizures   . Unspecified hereditary and idiopathic peripheral neuropathy   . Asthma   . Fibromyalgia   . Vertebral  compression fracture 5/14    L4    Past Surgical History  Procedure Laterality Date  . Total abdominal hysterectomy w/ bilateral salpingoophorectomy  1977  (APPROX)  . Cholecystectomy    . Appendectomy    . Femur fracture surgery  12-18-2010  DR BEANE    INTRAMEDULLARY NAILING LEFT INTERTROCHANTERIC -SUBTROCHANTERIC FX  . Right breast lumpectomy / removal lymph nodes x2/ removal pac  10-01-2010    CARCINOMA RIGHT BREAST  . Placement port- a- cath  02-18-2010  . Bronchoscopy  01-29-2010    W/ BX  . Tvt vaginal tape  suburethral sling   06-08-2005    SUI  . Bilateral laminectomy/ foraminotomy  01-14-2004    L4 - L5  . Breast lumpectomy  1996     LEFT BREAST DCIS   . Cataract extraction w/ intraocular lens  implant, bilateral    . Bilateral carpal tunnel release    . Tonsillectomy    . Transthoracic echocardiogram  02-25-2010    LVSF NORMAL/ EF 01-09%/ GRADE I DIASTOLIC DYSFUNCTION/ MILDLY DILATED LEFT ATRIUM  . Hardware removal  10/11/2011    Procedure: HARDWARE REMOVAL;  Surgeon: Johnn Hai, MD;  Location: [facility];  Service: Orthopedics;  Laterality: Left;  LEFT THIGH REMOVAL OF DISTAL LOCKING SCREW NEEDS: [place], RADIO LUSCENT TABLE AND SCREWDRIVER FOR BIOMET ROD   .  Colonoscopy      Prior to Admission medications   Medication Sig Start Date End Date Taking? Authorizing Provider  dexlansoprazole (DEXILANT) 60 MG capsule Take 60 mg by mouth daily as needed (for acid reflux).    Yes Historical Provider, MD  DULoxetine (CYMBALTA) 60 MG capsule Take 60 mg by mouth daily after lunch.    Yes Historical Provider, MD  ergocalciferol (VITAMIN D2) 50000 UNITS capsule Take 50,000 Units by mouth once a week. tuesday   Yes Historical Provider, MD  ezetimibe-simvastatin (VYTORIN) 10-80 MG per tablet Take 1 tablet by mouth at bedtime.    Yes Historical Provider, MD  gabapentin (NEURONTIN) 100 MG capsule Take 300 mg by mouth 3 (three) times daily.    Yes Historical  Provider, MD  HYDROmorphone (DILAUDID) 2 MG tablet Take 2 mg by mouth daily. 11/13/12  Yes Historical Provider, MD  hyoscyamine (LEVBID) 0.375 MG 12 hr tablet Take 1 tablet by mouth every 12 (twelve) hours. 07/10/12  Yes Historical Provider, MD  levETIRAcetam (KEPPRA) 500 MG tablet Take 1 tablet (500 mg total) by mouth 2 (two) times daily. 06/14/12  Yes Marcial Pacas, MD  levothyroxine (SYNTHROID, LEVOTHROID) 88 MCG tablet Take 88 mcg by mouth daily before breakfast.    Yes Historical Provider, MD  LORazepam (ATIVAN) 0.5 MG tablet Take 1 tablet (0.5 mg total) by mouth every 8 (eight) hours as needed. Anxiety 07/04/12  Yes Vivien Rota, NP  meloxicam (MOBIC) 15 MG tablet Take 15 mg by mouth every morning.  03/07/13  Yes Historical Provider, MD  metroNIDAZOLE (METROGEL) 1 % gel Apply 1 application topically daily. Applied to face for rosacea.   Yes Historical Provider, MD  MICRONIZED COLESTIPOL HCL 1 G tablet Take 1 tablet by mouth daily. 07/10/12  Yes Historical Provider, MD  oxyCODONE-acetaminophen (PERCOCET) 5-325 MG per tablet Take 1 tablet by mouth every 4 (four) hours as needed. 11/03/12  Yes Velna Hatchet, MD  predniSONE (DELTASONE) 5 MG tablet Take 1 tablet (5 mg total) by mouth daily with breakfast. 07/29/12  Yes Sheela Stack, MD  UCERIS 9 MG TB24 Take 1 capsule by mouth every 12 (twelve) hours. 07/10/12  Yes Historical Provider, MD    Scheduled Meds: . hydrALAZINE  25 mg Intravenous BID  . levETIRAcetam  500 mg Oral BID  . methylPREDNISolone (SOLU-MEDROL) injection  40 mg Intravenous Q24H  . [START ON 03/14/2013] pantoprazole (PROTONIX) IV  40 mg Intravenous Q12H  . sodium chloride  1,000 mL Intravenous STAT  . sodium chloride  3 mL Intravenous Q12H   Continuous Infusions: . sodium chloride 75 mL/hr at 03/10/13 1730  . pantoprozole (PROTONIX) infusion 8 mg/hr (03/10/13 1900)   PRN Meds:.HYDROmorphone (DILAUDID) injection, ondansetron (ZOFRAN) IV  Allergies as of 03/10/2013 - Review  Complete 03/10/2013  Allergen Reaction Noted  . Penicillins Anaphylaxis   . Adhesive [tape] Other (See Comments) 01/01/2011  . Doxycycline Nausea And Vomiting 10/21/2010  . Morphine and related Other (See Comments) 10/05/2011    Family History  Problem Relation Age of Onset  . Emphysema Father   . Ovarian cancer Mother   . Cancer Mother     cervical  . Breast cancer Maternal Grandmother   . Cancer Sister     cervical    History   Social History  . Marital Status: Married    Spouse Name: N/A    Number of Children: N/A  . Years of Education: N/A   Occupational History  . retired    Science writer  History Main Topics  . Smoking status: Former Smoker -- 1.00 packs/day for 30 years    Types: Cigarettes    Quit date: 03/01/1978  . Smokeless tobacco: Never Used  . Alcohol Use: 4.2 oz/week    7 Glasses of wine per week  . Drug Use: No  . Sexual Activity: No   Other Topics Concern  . Not on file   Social History Narrative  . No narrative on file    Review of Systems: All negative except as stated above in HPI.  Physical Exam: Vital signs: Filed Vitals:   03/10/13 1900  BP: 186/73  Pulse: 87  Temp: 98.3  Resp: 13   Last BM Date: 03/10/13 General:   Alert,  Well-developed, well-nourished, pleasant and cooperative in NAD HEENT: anicteric, dry mouth Neck: supple, nontender Lungs:  Clear throughout to auscultation.   No wheezes, crackles, or rhonchi. No acute distress. Heart:  Regular rate and rhythm; no murmurs, clicks, rubs,  or gallops. Abdomen: periumbilical and epigastric tenderness with guarding, soft, nondistended, +BS  Rectal:  Deferred Ext: LLE: bandaged and tender; RLE: tender with minimal edema  GI:  Lab Results:  Recent Labs  03/10/13 1413 03/10/13 1805  WBC 11.0* 8.7  HGB 10.7* 10.6*  HCT 33.7* 32.2*  PLT 220 212   BMET  Recent Labs  03/10/13 1413  NA 137  K 4.0  CL 99  CO2 25  GLUCOSE 80  BUN 23  CREATININE 1.32*  CALCIUM 8.8    LFT  Recent Labs  03/10/13 1413  PROT 6.1  ALBUMIN 2.8*  AST 10  ALT 8  ALKPHOS 92  BILITOT 0.5   PT/INR  Recent Labs  03/10/13 1413  LABPROT 12.8  INR 0.98     Studies/Results: Dg Chest 2 View  03/10/2013   CLINICAL DATA:  Shortness of breath, left rib pain  EXAM: CHEST  2 VIEW  COMPARISON:  03/14/2012  FINDINGS: Cardiomediastinal silhouette is stable. There is chronic elevation of the right hemidiaphragm. Surgical clips are noted left chest wall and left axilla. There is hazy atelectasis or infiltrate in right midlung and right lower lobe peripheral. No pulmonary edema. Bony thorax is stable. Small hiatal hernia.  IMPRESSION: Hazy peripheral atelectasis or infiltrate in right midlung and right lower lobe. Follow-up to resolution is recommended. No pulmonary edema.   Electronically Signed   By: Lahoma Crocker M.D.   On: 03/10/2013 14:39    Impression/Plan: 79 yo with melena and coffee grounds emesis likely due to a peptic ulcer bleed. No evidence of ongoing bleeding. Patient is dehydrated and receiving volume resuscitation. BP noted to be elevated. Agree with IV PPI Q 12 hours. Follow H/Hs but Hgb likely to drop following rehydration. Agree with ice chips. NPO p MN. EGD planned for tomorrow morning. Needs to avoid all NSAIDs. Defer whether to taper Prednisone to Dr. Burt Knack. Thank you for this consultation.    LOS: 0 days   [place] C.  03/10/2013, 7:38 PM

## 2013-03-10 NOTE — H&P (Signed)
[name] is an 79 y.o. female.   PCP:   Sheela Stack, MD   Chief Complaint:  UGIB  HPI: 41 F c IBS, Fibromyalgia, Breast CA,PMR on Chronic Prednisone, and pains on Mobic.  She saw Dr Forde Dandy yesterday in office and was at her baseline.  She complains today of low grade Nausea for 2-3 weeks.  No Epigastric pains.  She remains on the [place].  She awoke @ 2 am c N/V, coffee ground emesis, and Melana.  She called [facility] and told to go to ED.  She has a GI bleed. ED started IV PPI empirically.  The patient was found to have a small amount of melanotic appearing stool which was Hemoccult positive on exam.  She is very dehydrated and tongue is dry and parched.  Cr up from 0.8 to 1.32.  WBC 11 and Hbg lower at 10.7 from 11.8 in Nov.  She will require admission and eval and treat.  I had ED Doc call [facility] to alert them she is here.     Past Medical History:  Past Medical History  Diagnosis Date  . IBS (irritable bowel syndrome)   . Fibromyalgia   . History of DVT (deep vein thrombosis)   . History of pulmonary embolism   . Osteoporosis   . History of breast cancer LEFT BREAST DCIS  1996  . Hyperlipidemia   . 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 Breast cancer RIGHT SIDE-- DX OCT 2011    CHEMORADIATION THERAPY-- COMPLETED   . PONV (postoperative nausea and vomiting)   . Skin tear LEFT ARM COVERED W/ TEGADERM    PT STATES HX MULTIPLE SKIN TEARS -- THIN  . Thin skin SECONARDY AGE AND HX CHEMO  . Ecchymosis   . Seasonal allergies   . Frequency of urination   . Nocturia   . Anemia   . Lung mass PER DR CLANCE NOTE UNCLEAR IF LYMPHOMA FROM BX    FOLLOWED BY DR RUBIN  . Seizures   . Unspecified hereditary and idiopathic peripheral neuropathy   . Asthma   . Fibromyalgia   . Vertebral compression fracture 5/14    L4    Past Surgical History  Procedure Laterality Date  . Total abdominal hysterectomy w/ bilateral  salpingoophorectomy  1977  (APPROX)  . Cholecystectomy    . Appendectomy    . Femur fracture surgery  12-18-2010  DR BEANE    INTRAMEDULLARY NAILING LEFT INTERTROCHANTERIC -SUBTROCHANTERIC FX  . Right breast lumpectomy / removal lymph nodes x2/ removal pac  10-01-2010    CARCINOMA RIGHT BREAST  . Placement port- a- cath  02-18-2010  . Bronchoscopy  01-29-2010    W/ BX  . Tvt vaginal tape  suburethral sling   06-08-2005    SUI  . Bilateral laminectomy/ foraminotomy  01-14-2004    L4 - L5  . Breast lumpectomy  1996     LEFT BREAST DCIS   . Cataract extraction w/ intraocular lens  implant, bilateral    . Bilateral carpal tunnel release    . Tonsillectomy    . Transthoracic echocardiogram  02-25-2010    LVSF NORMAL/ EF 78-29%/ GRADE I DIASTOLIC DYSFUNCTION/ MILDLY DILATED LEFT ATRIUM  . Hardware removal  10/11/2011    Procedure: HARDWARE REMOVAL;  Surgeon: Johnn Hai, MD;  Location: [facility];  Service: Orthopedics;  Laterality: Left;  LEFT THIGH REMOVAL OF DISTAL LOCKING SCREW NEEDS: [place], RADIO [place]  TABLE AND SCREWDRIVER FOR BIOMET ROD   . Colonoscopy        Allergies:   Allergies  Allergen Reactions  . Penicillins Anaphylaxis  . Adhesive [Tape] Other (See Comments)    Blisters   . Doxycycline Nausea And Vomiting  . Morphine And Related Other (See Comments)    HALLUCINATIONS     Medications: Prior to Admission medications   Medication Sig Start Date End Date Taking? Authorizing Provider  dexlansoprazole (DEXILANT) 60 MG capsule Take 60 mg by mouth daily as needed (for acid reflux).    Yes Historical Provider, MD  DULoxetine (CYMBALTA) 60 MG capsule Take 60 mg by mouth daily after lunch.    Yes Historical Provider, MD  ergocalciferol (VITAMIN D2) 50000 UNITS capsule Take 50,000 Units by mouth once a week. tuesday   Yes Historical Provider, MD  ezetimibe-simvastatin (VYTORIN) 10-80 MG per tablet Take 1 tablet by mouth at bedtime.    Yes Historical  Provider, MD  gabapentin (NEURONTIN) 100 MG capsule Take 300 mg by mouth 3 (three) times daily.    Yes Historical Provider, MD  HYDROmorphone (DILAUDID) 2 MG tablet Take 2 mg by mouth daily. 11/13/12  Yes Historical Provider, MD  hyoscyamine (LEVBID) 0.375 MG 12 hr tablet Take 1 tablet by mouth every 12 (twelve) hours. 07/10/12  Yes Historical Provider, MD  levETIRAcetam (KEPPRA) 500 MG tablet Take 1 tablet (500 mg total) by mouth 2 (two) times daily. 06/14/12  Yes Marcial Pacas, MD  levothyroxine (SYNTHROID, LEVOTHROID) 88 MCG tablet Take 88 mcg by mouth daily before breakfast.    Yes Historical Provider, MD  LORazepam (ATIVAN) 0.5 MG tablet Take 1 tablet (0.5 mg total) by mouth every 8 (eight) hours as needed. Anxiety 07/04/12  Yes Vivien Rota, NP  meloxicam (MOBIC) 15 MG tablet Take 15 mg by mouth every morning.  03/07/13  Yes Historical Provider, MD  metroNIDAZOLE (METROGEL) 1 % gel Apply 1 application topically daily. Applied to face for rosacea.   Yes Historical Provider, MD  MICRONIZED COLESTIPOL HCL 1 G tablet Take 1 tablet by mouth daily. 07/10/12  Yes Historical Provider, MD  oxyCODONE-acetaminophen (PERCOCET) 5-325 MG per tablet Take 1 tablet by mouth every 4 (four) hours as needed. 11/03/12  Yes Velna Hatchet, MD  predniSONE (DELTASONE) 5 MG tablet Take 1 tablet (5 mg total) by mouth daily with breakfast. 07/29/12  Yes Sheela Stack, MD  UCERIS 9 MG TB24 Take 1 capsule by mouth every 12 (twelve) hours. 07/10/12  Yes Historical Provider, MD      (Not in a hospital admission)   Social History:  reports that she quit smoking about 35 years ago. Her smoking use included Cigarettes. She has a 30 pack-year smoking history. She has never used smokeless tobacco. She reports that she drinks about 4.2 ounces of alcohol per week. She reports that she does not use illicit drugs.  Family History: Family History  Problem Relation Age of Onset  . Emphysema Father   . Ovarian cancer Mother   .  Cancer Mother     cervical  . Breast cancer Maternal Grandmother   . Cancer Sister     cervical    Review of Systems:  Review of Systems - Denies CP/SOB or bladder issues. We discussed her Skin issues RLE>LLE. Black and tarry emesis and stools + Ab Discomfort DeH Full ROS obtained   Physical Exam:  Blood pressure 130/47, pulse 62, temperature 98.3 F (36.8 C), temperature source Oral, resp. rate 24,  SpO2 98.00%. Filed Vitals:   03/10/13 1342  BP: 130/47  Pulse: 62  Temp: 98.3 F (36.8 C)  TempSrc: Oral  Resp: 24  SpO2: 98%   General appearance: Alert and O. Very dry parched tongue. Head: Normocephalic, without obvious abnormality, atraumatic Eyes: conjunctivae/corneas clear. PERRL, EOM's intact.  Nose: Nares normal. Septum midline. Mucosa normal. No drainage or sinus tenderness. Throat: lips, mucosa, and tongue normal; teeth and gums normal Neck: no adenopathy, no carotid bruit, no JVD and thyroid not enlarged, symmetric, no tenderness/mass/nodules Resp: CTA Cardio: Reg GI: Increased BS and mild tender.  no rebound and no guarding Extremities: extremities s edema.  Healed RLE blisters.  LLE Wrapped. Pulses: 2+ and symmetric Lymph nodes:  no cervical lymphadenopathy Neurologic: Alert and oriented X 3, normal strength and tone. Normal symmetric reflexes.     Labs on Admission:   Recent Labs  03/10/13 1413  NA 137  K 4.0  CL 99  CO2 25  GLUCOSE 80  BUN 23  CREATININE 1.32*  CALCIUM 8.8    Recent Labs  03/10/13 1413  AST 10  ALT 8  ALKPHOS 92  BILITOT 0.5  PROT 6.1  ALBUMIN 2.8*    Recent Labs  03/10/13 1413  LIPASE 17    Recent Labs  03/10/13 1413  WBC 11.0*  NEUTROABS 7.4  HGB 10.7*  HCT 33.7*  MCV 93.1  PLT 220    Recent Labs  03/10/13 1413  TROPONINI <0.30   Lab Results  Component Value Date   INR 0.98 03/10/2013   INR 0.99 10/31/2012   INR 0.94 12/18/2010     LAB RESULT POCT:  Results for orders placed during the  hospital encounter of 03/10/13  CBC WITH DIFFERENTIAL      Result Value Range   WBC 11.0 (*) 4.0 - 10.5 K/uL   RBC 3.62 (*) 3.87 - 5.11 MIL/uL   Hemoglobin 10.7 (*) 12.0 - 15.0 g/dL   HCT 33.7 (*) 36.0 - 46.0 %   MCV 93.1  78.0 - 100.0 fL   MCH 29.6  26.0 - 34.0 pg   MCHC 31.8  30.0 - 36.0 g/dL   RDW 14.2  11.5 - 15.5 %   Platelets 220  150 - 400 K/uL   Neutrophils Relative % 67  43 - 77 %   Neutro Abs 7.4  1.7 - 7.7 K/uL   Lymphocytes Relative 18  12 - 46 %   Lymphs Abs 2.0  0.7 - 4.0 K/uL   Monocytes Relative 14 (*) 3 - 12 %   Monocytes Absolute 1.5 (*) 0.1 - 1.0 K/uL   Eosinophils Relative 1  0 - 5 %   Eosinophils Absolute 0.1  0.0 - 0.7 K/uL   Basophils Relative 0  0 - 1 %   Basophils Absolute 0.0  0.0 - 0.1 K/uL  COMPREHENSIVE METABOLIC PANEL      Result Value Range   Sodium 137  137 - 147 mEq/L   Potassium 4.0  3.7 - 5.3 mEq/L   Chloride 99  96 - 112 mEq/L   CO2 25  19 - 32 mEq/L   Glucose, Bld 80  70 - 99 mg/dL   BUN 23  6 - 23 mg/dL   Creatinine, Ser 1.32 (*) 0.50 - 1.10 mg/dL   Calcium 8.8  8.4 - 10.5 mg/dL   Total Protein 6.1  6.0 - 8.3 g/dL   Albumin 2.8 (*) 3.5 - 5.2 g/dL   AST 10  0 -  37 U/L   ALT 8  0 - 35 U/L   Alkaline Phosphatase 92  39 - 117 U/L   Total Bilirubin 0.5  0.3 - 1.2 mg/dL   GFR calc non Af Amer 38 (*) >90 mL/min   GFR calc Af Amer 44 (*) >90 mL/min  PROTIME-INR      Result Value Range   Prothrombin Time 12.8  11.6 - 15.2 seconds   INR 0.98  0.00 - 1.49  LIPASE, BLOOD      Result Value Range   Lipase 17  11 - 59 U/L  TROPONIN I      Result Value Range   Troponin I <0.30  <0.30 ng/mL  TYPE AND SCREEN      Result Value Range   ABO/RH(D) O POS     Antibody Screen NEG     Sample Expiration 03/13/2013        Radiological Exams on Admission: Dg Chest 2 View  03/10/2013   CLINICAL DATA:  Shortness of breath, left rib pain  EXAM: CHEST  2 VIEW  COMPARISON:  03/14/2012  FINDINGS: Cardiomediastinal silhouette is stable. There is chronic  elevation of the right hemidiaphragm. Surgical clips are noted left chest wall and left axilla. There is hazy atelectasis or infiltrate in right midlung and right lower lobe peripheral. No pulmonary edema. Bony thorax is stable. Small hiatal hernia.  IMPRESSION: Hazy peripheral atelectasis or infiltrate in right midlung and right lower lobe. Follow-up to resolution is recommended. No pulmonary edema.   Electronically Signed   By: Lahoma Crocker M.D.   On: 03/10/2013 14:39      Orders placed during the hospital encounter of 03/10/13  . EKG 12-LEAD  . EKG 12-LEAD  . EKG 12-LEAD  . EKG 12-LEAD     Assessment/Plan Active Problems:   Renal insufficiency   GI bleed  UGIB - IV PPI.  GI Consulted.  Mobic stopped. She will need to stay on Prednisone/steroids to avoid adrenal insuff.  NPO x sips.  Will need EGD.  Type and screen.  Seriel CBCs and follow Hbg.  She is Hemodynamically stable. Admit to stepdown.  She is on Budesonide for ? Prior colitis.  Deh/azotemia - Hydrate  LE issues c bullous dermatopathy - Keep RLE wrapped as she is comfortable.  DVT Proph - cannot do SCDs due to her legs.  Cannot do Anticoagulation due to the bleeding.  With prior h/o DVt/PE - early ambulation is main gaol.  IBS.  H/O Breast CA s/p Rx.  Peripheral Neuropathy/Fibromyalgia - When taking POS - anything but NSAIDs or Cox 2  Lipids on Vytorin  Sz D/O on Keppra.  Anxiety/Depression - Cymbalta/Ativan  PMR on Chronic Prednisone  Flo Berroa M 03/10/2013, 4:06 PM

## 2013-03-10 NOTE — ED Provider Notes (Signed)
CSN: [id number]     Arrival date & time 03/10/13  1330 History   First MD Initiated Contact with Patient 03/10/13 1336     Chief Complaint  Patient presents with  . Abdominal Pain   (Consider location/radiation/quality/duration/timing/severity/associated sxs/prior Treatment) Patient is a 79 y.o. female presenting with hematochezia. The history is provided by the patient.  Rectal Bleeding Quality:  Black and tarry Amount:  Moderate Duration:  12 hours Timing:  Intermittent Progression:  Unchanged Chronicity:  New Context: spontaneously   Relieved by:  Nothing Worsened by:  Nothing tried Ineffective treatments:  None tried Associated symptoms: abdominal pain and vomiting   Associated symptoms: no dizziness and no fever     Past Medical History  Diagnosis Date  . IBS (irritable bowel syndrome)   . Fibromyalgia   . History of DVT (deep vein thrombosis)   . History of pulmonary embolism   . Osteoporosis   . History of breast cancer LEFT BREAST DCIS  1996  . Hyperlipidemia   . 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 Breast cancer RIGHT SIDE-- DX OCT 2011    CHEMORADIATION THERAPY-- COMPLETED   . PONV (postoperative nausea and vomiting)   . Skin tear LEFT ARM COVERED W/ TEGADERM    PT STATES HX MULTIPLE SKIN TEARS -- THIN  . Thin skin SECONARDY AGE AND HX CHEMO  . Ecchymosis   . Seasonal allergies   . Frequency of urination   . Nocturia   . Anemia   . Lung mass PER DR CLANCE NOTE UNCLEAR IF LYMPHOMA FROM BX    FOLLOWED BY DR RUBIN  . Seizures   . Unspecified hereditary and idiopathic peripheral neuropathy   . Asthma   . Fibromyalgia   . Vertebral compression fracture 5/14    L4   Past Surgical History  Procedure Laterality Date  . Total abdominal hysterectomy w/ bilateral salpingoophorectomy  1977  (APPROX)  . Cholecystectomy    . Appendectomy    . Femur fracture surgery  12-18-2010  DR BEANE    INTRAMEDULLARY NAILING LEFT  INTERTROCHANTERIC -SUBTROCHANTERIC FX  . Right breast lumpectomy / removal lymph nodes x2/ removal pac  10-01-2010    CARCINOMA RIGHT BREAST  . Placement port- a- cath  02-18-2010  . Bronchoscopy  01-29-2010    W/ BX  . Tvt vaginal tape  suburethral sling   06-08-2005    SUI  . Bilateral laminectomy/ foraminotomy  01-14-2004    L4 - L5  . Breast lumpectomy  1996     LEFT BREAST DCIS   . Cataract extraction w/ intraocular lens  implant, bilateral    . Bilateral carpal tunnel release    . Tonsillectomy    . Transthoracic echocardiogram  02-25-2010    LVSF NORMAL/ EF [id number] GRADE I DIASTOLIC DYSFUNCTION/ MILDLY DILATED LEFT ATRIUM  . Hardware removal  10/11/2011    Procedure: HARDWARE REMOVAL;  Surgeon: Johnn Hai, MD;  Location: [facility];  Service: Orthopedics;  Laterality: Left;  LEFT THIGH REMOVAL OF DISTAL LOCKING SCREW NEEDS: FLORO, RADIO LUSCENT TABLE AND SCREWDRIVER FOR BIOMET ROD   . Colonoscopy     Family History  Problem Relation Age of Onset  . Emphysema Father   . Ovarian cancer Mother   . Cancer Mother     cervical  . Breast cancer Maternal Grandmother   . Cancer Sister     cervical   History  Substance Use Topics  . Smoking  status: Former Smoker -- 1.00 packs/day for 30 years    Types: Cigarettes    Quit date: 03/01/1978  . Smokeless tobacco: Never Used  . Alcohol Use: 4.2 oz/week    7 Glasses of wine per week   OB History   Grav Para Term Preterm Abortions TAB SAB Ect Mult Living                 Review of Systems  Constitutional: Negative for fever and fatigue.  HENT: Negative for congestion and drooling.   Eyes: Negative for pain.  Respiratory: Positive for shortness of breath (mild, at baseline). Negative for cough.   Cardiovascular: Negative for chest pain.  Gastrointestinal: Positive for nausea, vomiting, abdominal pain, diarrhea and hematochezia.  Genitourinary: Negative for dysuria and hematuria.  Musculoskeletal:  Positive for back pain. Negative for gait problem and neck pain.  Skin: Negative for color change.  Neurological: Negative for dizziness and headaches.  Hematological: Negative for adenopathy.  Psychiatric/Behavioral: Negative for behavioral problems.  All other systems reviewed and are negative.    Allergies  Penicillins; Adhesive; Doxycycline; and Morphine and related  Home Medications   Current Outpatient Rx  Name  Route  Sig  Dispense  Refill  . dexlansoprazole (DEXILANT) 60 MG capsule   Oral   Take 60 mg by mouth 2 (two) times daily.          . DULoxetine (CYMBALTA) 60 MG capsule   Oral   Take 60 mg by mouth daily.         . ergocalciferol (VITAMIN D2) 50000 UNITS capsule   Oral   Take 50,000 Units by mouth once a week. tuesday         . ezetimibe-simvastatin (VYTORIN) 10-80 MG per tablet   Oral   Take 1 tablet by mouth at bedtime.          . gabapentin (NEURONTIN) 100 MG capsule   Oral   Take 300 mg by mouth 2 (two) times daily.          Marland Kitchen HYDROmorphone (DILAUDID) 2 MG tablet   Oral   Take 2 mg by mouth daily.         . hyoscyamine (LEVBID) 0.375 MG 12 hr tablet   Oral   Take 1 tablet by mouth every 12 (twelve) hours.         . levETIRAcetam (KEPPRA) 500 MG tablet   Oral   Take 1 tablet (500 mg total) by mouth 2 (two) times daily.   180 tablet   3   . levothyroxine (SYNTHROID, LEVOTHROID) 88 MCG tablet   Oral   Take 88 mcg by mouth every morning.          Marland Kitchen LORazepam (ATIVAN) 0.5 MG tablet   Oral   Take 1 tablet (0.5 mg total) by mouth every 8 (eight) hours as needed. Anxiety   30 tablet   0     Patient to contact PCP for further refills.   . metroNIDAZOLE (METROGEL) 1 % gel   Topical   Apply 1 application topically daily. Applied to face for rosacea.         Marland Kitchen MICRONIZED COLESTIPOL HCL 1 G tablet   Oral   Take 1 tablet by mouth daily.         Marland Kitchen oxyCODONE-acetaminophen (PERCOCET) 5-325 MG per tablet   Oral   Take 1  tablet by mouth every 4 (four) hours as needed.   120 tablet   0   .  predniSONE (DELTASONE) 5 MG tablet   Oral   Take 1 tablet (5 mg total) by mouth daily with breakfast.   14 tablet   0   . UCERIS 9 MG TB24   Oral   Take 1 capsule by mouth every 12 (twelve) hours.          BP 130/47  Pulse 62  Temp(Src) 98.3 F (36.8 C) (Oral)  Resp 24  SpO2 98% Physical Exam  Nursing note and vitals reviewed. Constitutional: She is oriented to person, place, and time. She appears well-developed and well-nourished.  HENT:  Head: Normocephalic.  Mouth/Throat: No oropharyngeal exudate.  Dry oral mucous membranes.   Eyes: Conjunctivae and EOM are normal. Pupils are equal, round, and reactive to light.  Neck: Normal range of motion. Neck supple.  Cardiovascular: Normal rate, regular rhythm, normal heart sounds and intact distal pulses.  Exam reveals no gallop and no friction rub.   No murmur heard. Pulmonary/Chest: Effort normal and breath sounds normal. No respiratory distress. She has no wheezes.  Abdominal: Soft. Bowel sounds are normal. There is tenderness (mild periumbilical tenderness to palpation.). There is no rebound and no guarding.  Musculoskeletal: Normal range of motion. She exhibits no edema and no tenderness.  Tenderness to palpation of the left lower posterior ribs.  Neurological: She is alert and oriented to person, place, and time.  Skin: Skin is warm and dry.  Occlusive dressing to left lower extremity extending from the left knee to the toes. The patient requested I did not remove this dressing.  Psychiatric: She has a normal mood and affect. Her behavior is normal.    ED Course  Procedures (including critical care time) Labs Review Labs Reviewed  CBC WITH DIFFERENTIAL - Abnormal; Notable for the following:    WBC 11.0 (*)    RBC 3.62 (*)    Hemoglobin 10.7 (*)    HCT 33.7 (*)    Monocytes Relative 14 (*)    Monocytes Absolute 1.5 (*)    All other components  within normal limits  COMPREHENSIVE METABOLIC PANEL - Abnormal; Notable for the following:    Creatinine, Ser 1.32 (*)    Albumin 2.8 (*)    GFR calc non Af Amer 38 (*)    GFR calc Af Amer 44 (*)    All other components within normal limits  PROTIME-INR  LIPASE, BLOOD  TROPONIN I  URINALYSIS, ROUTINE W REFLEX MICROSCOPIC  TYPE AND SCREEN   Imaging Review Dg Chest 2 View  03/10/2013   CLINICAL DATA:  Shortness of breath, left rib pain  EXAM: CHEST  2 VIEW  COMPARISON:  03/14/2012  FINDINGS: Cardiomediastinal silhouette is stable. There is chronic elevation of the right hemidiaphragm. Surgical clips are noted left chest wall and left axilla. There is hazy atelectasis or infiltrate in right midlung and right lower lobe peripheral. No pulmonary edema. Bony thorax is stable. Small hiatal hernia.  IMPRESSION: Hazy peripheral atelectasis or infiltrate in right midlung and right lower lobe. Follow-up to resolution is recommended. No pulmonary edema.   Electronically Signed   By: Lahoma Crocker M.D.   On: 03/10/2013 14:39    EKG Interpretation    Date/Time:  Saturday March 10 2013 14:03:28 EST Ventricular Rate:  88 PR Interval:  147 QRS Duration: 85 QT Interval:  389 QTC Calculation: 471 R Axis:   -11 Text Interpretation:  Sinus rhythm Low voltage, extremity leads No significant change since last tracing Confirmed by Clarine Elrod  MD, Marigny Borre ([id number]) on  03/10/2013 2:28:36 PM            MDM   1. GI bleed   2. Renal insufficiency    2:09 PM 79 y.o. female with a history of IBS who presents with dark stool and dark emesis which began at approximately 2 AM this morning. She also notes some intermittent periumbilical spasms. She is afebrile and vital signs are unremarkable here. Will get screening labwork and workup for GI bleed. Will give 1 L IVF. Will start PPI gtt empirically.   The patient was found to have a small amount of melanotic appearing stool which was Hemoccult positive on my exam.    3:53 PM I spoke w/ on call GI doc for [facility]. Notified him of her labwork. He would like the pt to be NPO at midnight.   Blanchard Kelch, MD 03/10/13 [phone]

## 2013-03-10 NOTE — ED Notes (Signed)
Last night developed abd pain, black stool followed by vomiting today black in color

## 2013-03-11 ENCOUNTER — Encounter (HOSPITAL_COMMUNITY): Payer: Self-pay

## 2013-03-11 ENCOUNTER — Encounter (HOSPITAL_COMMUNITY)
Admission: EM | Disposition: A | Discharge status: Home / Self Care | Payer: Self-pay | Source: Home / Self Care | Attending: Endocrinology

## 2013-03-11 DIAGNOSIS — R52 Pain, unspecified: Secondary | ICD-10-CM | POA: Diagnosis not present

## 2013-03-11 DIAGNOSIS — K31819 Angiodysplasia of stomach and duodenum without bleeding: Secondary | ICD-10-CM | POA: Diagnosis not present

## 2013-03-11 DIAGNOSIS — K2960 Other gastritis without bleeding: Secondary | ICD-10-CM | POA: Diagnosis not present

## 2013-03-11 DIAGNOSIS — E860 Dehydration: Secondary | ICD-10-CM | POA: Diagnosis not present

## 2013-03-11 DIAGNOSIS — K921 Melena: Secondary | ICD-10-CM | POA: Diagnosis not present

## 2013-03-11 DIAGNOSIS — D649 Anemia, unspecified: Secondary | ICD-10-CM | POA: Diagnosis not present

## 2013-03-11 DIAGNOSIS — N289 Disorder of kidney and ureter, unspecified: Secondary | ICD-10-CM | POA: Diagnosis not present

## 2013-03-11 DIAGNOSIS — K922 Gastrointestinal hemorrhage, unspecified: Secondary | ICD-10-CM | POA: Diagnosis not present

## 2013-03-11 HISTORY — PX: ESOPHAGOGASTRODUODENOSCOPY: SHX5428

## 2013-03-11 LAB — BASIC METABOLIC PANEL
BUN: 15 mg/dL (ref 6–23)
CHLORIDE: 103 meq/L (ref 96–112)
CO2: 20 mEq/L (ref 19–32)
Calcium: 8.4 mg/dL (ref 8.4–10.5)
Creatinine, Ser: 0.75 mg/dL (ref 0.50–1.10)
GFR, EST NON AFRICAN AMERICAN: 80 mL/min — AB (ref >90)
Glucose, Bld: 114 mg/dL — ABNORMAL HIGH (ref 70–99)
Potassium: 4 mEq/L (ref 3.7–5.3)
SODIUM: 138 meq/L (ref 137–147)

## 2013-03-11 LAB — CBC
HEMATOCRIT: 36.4 % (ref 36.0–46.0)
Hemoglobin: 11.7 g/dL — ABNORMAL LOW (ref 12.0–15.0)
MCH: 29.5 pg (ref 26.0–34.0)
MCHC: 32.1 g/dL (ref 30.0–36.0)
MCV: 91.7 fL (ref 78.0–100.0)
Platelets: 253 10*3/uL (ref 150–400)
RBC: 3.97 MIL/uL (ref 3.87–5.11)
RDW: 14.2 % (ref 11.5–15.5)
WBC: 8.3 10*3/uL (ref 4.0–10.5)

## 2013-03-11 SURGERY — EGD (ESOPHAGOGASTRODUODENOSCOPY)
Anesthesia: Moderate Sedation

## 2013-03-11 MED ORDER — HYOSCYAMINE SULFATE ER 0.375 MG PO TB12
0.3750 mg | ORAL_TABLET | Freq: Two times a day (BID) | ORAL | Status: DC
  Administered 2013-03-11 – 2013-03-13 (×5): 0.375 mg via ORAL
  Filled 2013-03-11 (×6): qty 1

## 2013-03-11 MED ORDER — SODIUM CHLORIDE 0.9 % IV SOLN: INTRAVENOUS | Status: DC

## 2013-03-11 MED ORDER — LORAZEPAM 0.5 MG PO TABS
0.5000 mg | ORAL_TABLET | Freq: Three times a day (TID) | ORAL | Status: DC | PRN
  Administered 2013-03-13: 0.5 mg via ORAL
  Filled 2013-03-11: qty 1

## 2013-03-11 MED ORDER — MIDAZOLAM HCL 10 MG/2ML IJ SOLN
INTRAMUSCULAR | Status: AC
  Filled 2013-03-11: qty 2

## 2013-03-11 MED ORDER — MIDAZOLAM HCL 10 MG/2ML IJ SOLN
INTRAMUSCULAR | Status: DC | PRN
  Administered 2013-03-11: 1 mg via INTRAVENOUS
  Administered 2013-03-11 (×2): 2 mg via INTRAVENOUS

## 2013-03-11 MED ORDER — FENTANYL CITRATE 0.05 MG/ML IJ SOLN
INTRAMUSCULAR | Status: AC
  Filled 2013-03-11: qty 2

## 2013-03-11 MED ORDER — SODIUM CHLORIDE 0.9 % IV SOLN
INTRAVENOUS | Status: DC
  Administered 2013-03-12: 03:00:00 via INTRAVENOUS

## 2013-03-11 MED ORDER — PREDNISONE 5 MG PO TABS
5.0000 mg | ORAL_TABLET | Freq: Every day | ORAL | Status: DC
  Administered 2013-03-12 – 2013-03-13 (×2): 5 mg via ORAL
  Filled 2013-03-11 (×3): qty 1

## 2013-03-11 MED ORDER — FENTANYL CITRATE 0.05 MG/ML IJ SOLN
INTRAMUSCULAR | Status: DC | PRN
  Administered 2013-03-11 (×2): 25 ug via INTRAVENOUS

## 2013-03-11 MED ORDER — PANTOPRAZOLE SODIUM 40 MG PO TBEC
40.0000 mg | DELAYED_RELEASE_TABLET | Freq: Two times a day (BID) | ORAL | Status: DC
Start: 2013-03-11 — End: 2013-03-13
  Administered 2013-03-11 – 2013-03-13 (×4): 40 mg via ORAL
  Filled 2013-03-11 (×7): qty 1

## 2013-03-11 MED ORDER — GABAPENTIN 300 MG PO CAPS
300.0000 mg | ORAL_CAPSULE | Freq: Three times a day (TID) | ORAL | Status: DC
  Administered 2013-03-11 – 2013-03-13 (×6): 300 mg via ORAL
  Filled 2013-03-11 (×8): qty 1

## 2013-03-11 MED ORDER — BUDESONIDE 3 MG PO CP24
9.0000 mg | ORAL_CAPSULE | Freq: Every day | ORAL | Status: DC
  Administered 2013-03-11 – 2013-03-13 (×3): 9 mg via ORAL
  Filled 2013-03-11 (×3): qty 3

## 2013-03-11 MED ORDER — OXYCODONE-ACETAMINOPHEN 5-325 MG PO TABS
1.0000 | ORAL_TABLET | ORAL | Status: DC | PRN
  Administered 2013-03-11 – 2013-03-13 (×7): 1 via ORAL
  Filled 2013-03-11 (×8): qty 1

## 2013-03-11 MED ORDER — DULOXETINE HCL 60 MG PO CPEP
60.0000 mg | ORAL_CAPSULE | Freq: Every day | ORAL | Status: DC
  Administered 2013-03-11 – 2013-03-13 (×3): 60 mg via ORAL
  Filled 2013-03-11 (×3): qty 1

## 2013-03-11 MED ORDER — LEVOTHYROXINE SODIUM 88 MCG PO TABS
88.0000 ug | ORAL_TABLET | Freq: Every day | ORAL | Status: DC
  Administered 2013-03-12 – 2013-03-13 (×2): 88 ug via ORAL
  Filled 2013-03-11 (×3): qty 1

## 2013-03-11 MED ORDER — BUTAMBEN-TETRACAINE-BENZOCAINE 2-2-14 % EX AERO
INHALATION_SPRAY | CUTANEOUS | Status: DC | PRN
  Administered 2013-03-11: 2 via TOPICAL

## 2013-03-11 MED ORDER — BUDESONIDE 9 MG PO TB24: 1.0000 | ORAL_TABLET | Freq: Two times a day (BID) | ORAL | Status: DC

## 2013-03-11 NOTE — Op Note (Signed)
[facility] [place], [id number]   ENDOSCOPY PROCEDURE REPORT  PATIENT: [name], [name]  MR#: [id number] BIRTHDATE: [birth year] , 78  yrs. old GENDER: Female  ENDOSCOPIST: Wilford Corner, MD REFERRED XQ:JJHE Virgina Jock, M.D.  PROCEDURE DATE:  03/11/2013 PROCEDURE:   EGD w/ biopsy ASA CLASS:   Class III INDICATIONS:Melena.   Hematemesis. MEDICATIONS: Fentanyl 50 mcg IV, Versed 5 mg IV, and Cetacaine spray x 2  TOPICAL ANESTHETIC:  DESCRIPTION OF PROCEDURE:   After the risks benefits and alternatives of the procedure were thoroughly explained, informed consent was obtained.  The Pentax Gastroscope [id number]  endoscope was introduced through the mouth and advanced to the second portion of the duodenum , limited by Without limitations.   The instrument was slowly withdrawn as the mucosa was fully examined.     FINDINGS: The endoscope was inserted into the oropharynx and esophagus was intubated. The esophagus was normal in appearance. The gastroesophageal junction was normal in appearance.  Endoscope was advanced into the stomach, which revealed clear bilious fluid with mildly erythematous distal gastric mucosa (antrum) and nodularity in a longitudinal pattern. The endoscope was advanced to the duodenal bulb, which was edematous and difficult to intubate. Shallow focal ulceration at pyloric channel noted without active bleeding until after intubation by the endoscope caused minimal bleeding. A small nonbleeding duodenal AVM was noted in the second portion of duodenum and clear bilious fluid surrounded it.  The endoscope was withdrawn back into the stomach and retroflexion revealed a small focal area of erythema (nonspecific) at the GEJ seen in a small hiatal hernia sac. No active bleeding and no definite Mallory Weiss tear (MWT) seen. Endoscope was straightened and APC was used to fulgurate the duodenal AVM. A biopsy was then taken of the distal  antrum. Endoscope was withdrawn and the procedure was terminated.  COMPLICATIONS: None  ENDOSCOPIC IMPRESSION:     1. Mild - moderate antral gastritis - s/p biopsy 2. Shallow pyloric channel ulceration - likely due to NSAIDs 3. Hiatal hernia 4. Duodenal AVM - s/p APC 5. Doubt AVM was the source of her hematemesis or melena - patient may have had a small MWT that has resolved but no blood products in stomach to suggest recent bleed  RECOMMENDATIONS: Advance diet. PPI PO BID at discharge. No NSAIDs. F/U on bx as outpt.   REPEAT EXAM: N/A  _______________________________ Wilford Corner, MD eSigned:  Wilford Corner, MD 03/11/2013 10:19 AM    RD:EYCX Virgina Jock, MD Reynold Bowen, MD  PATIENT NAME:  [name], [name] MR#: [id number]

## 2013-03-11 NOTE — Progress Notes (Signed)
Subjective: Admitted last night c GIB. EGD c Dr Michail Sermon this am showed NO bleeding or blood products. Shallow ulceration noted at pyloric channel. Nonbleeding duodenal AVM seen and fulgurated. Clear bilious fluid seen. Needs to avoid all NSAIDs. Clear liquid diet and advance diet as tolerated.No additional GI workup needed right now unless bleeding recurs. Ok to go home tomorrow if stable on PO BID PPI.   Currently no C/o  Sleeping off anesthesia.  Hbg and HD stable    Objective: Vital signs in last 24 hours: Temp:  [97.7 F (36.5 C)-98.7 F (37.1 C)] 98 F (36.7 C) (01/11 1007) Pulse Rate:  [62-109] 87 (01/11 1100) Resp:  [11-25] 15 (01/11 1100) BP: (112-189)/(47-96) 123/55 mmHg (01/11 1100) SpO2:  [92 %-100 %] 95 % (01/11 1100) Weight:  [176.4 kg (388 lb 14.3 oz)] 176.4 kg (388 lb 14.3 oz) (01/11 0400) Weight change:  Last BM Date: 03/11/13  CBG (last 3)  No results found for this basename: GLUCAP,  in the last 72 hours  Intake/Output from previous day:  Intake/Output Summary (Last 24 hours) at 03/11/13 1127 Last data filed at 03/11/13 1100  Gross per 24 hour  Intake   2700 ml  Output    600 ml  Net   2100 ml   01/10 0701 - 01/11 0700 In: 2400 [I.V.:1400; IV Piggyback:1000] Out: 300 [Urine:300]   Physical Exam  General appearance: Alert and O.  OP - Moister Throat: lips, mucosa, and tongue normal; teeth and gums normal  Neck: no adenopathy, no carotid bruit, no JVD and thyroid not enlarged, symmetric, no tenderness/mass/nodules  Resp: CTA  Cardio: Reg  GI: Benign, no rebound and no guarding  Extremities: extremities s edema. Healed RLE blisters. LLE Wrapped.  Pulses: 2+ and symmetric  Lymph nodes: no cervical lymphadenopathy  Neurologic: Alert and oriented X 3, normal strength and tone. Normal symmetric reflexes.      Lab Results:  Recent Labs  03/10/13 1413 03/11/13 0336  NA 137 138  K 4.0 4.0  CL 99 103  CO2 25 20  GLUCOSE 80 114*  BUN 23 15   CREATININE 1.32* 0.75  CALCIUM 8.8 8.4     Recent Labs  03/10/13 1413  AST 10  ALT 8  ALKPHOS 92  BILITOT 0.5  PROT 6.1  ALBUMIN 2.8*     Recent Labs  03/10/13 1413 03/10/13 1805 03/11/13 0336  WBC 11.0* 8.7 8.3  NEUTROABS 7.4  --   --   HGB 10.7* 10.6* 11.7*  HCT 33.7* 32.2* 36.4  MCV 93.1 91.5 91.7  PLT 220 212 253    Lab Results  Component Value Date   INR 0.98 03/10/2013   INR 0.99 10/31/2012   INR 0.94 12/18/2010     Recent Labs  03/10/13 1413  TROPONINI <0.30    No results found for this basename: TSH, T4TOTAL, FREET3, T3FREE, THYROIDAB,  in the last 72 hours  No results found for this basename: VITAMINB12, FOLATE, FERRITIN, TIBC, IRON, RETICCTPCT,  in the last 72 hours  Micro Results: Recent Results (from the past 240 hour(s))  MRSA PCR SCREENING     Status: None   Collection Time    03/10/13  5:13 PM      Result Value Range Status   MRSA by PCR NEGATIVE  NEGATIVE Final   Comment:            The GeneXpert MRSA Assay (FDA     approved for NASAL specimens  only), is one component of a     comprehensive MRSA colonization     surveillance program. It is not     intended to diagnose MRSA     infection nor to guide or     monitor treatment for     MRSA infections.     Studies/Results: Dg Chest 2 View  03/10/2013   CLINICAL DATA:  Shortness of breath, left rib pain  EXAM: CHEST  2 VIEW  COMPARISON:  03/14/2012  FINDINGS: Cardiomediastinal silhouette is stable. There is chronic elevation of the right hemidiaphragm. Surgical clips are noted left chest wall and left axilla. There is hazy atelectasis or infiltrate in right midlung and right lower lobe peripheral. No pulmonary edema. Bony thorax is stable. Small hiatal hernia.  IMPRESSION: Hazy peripheral atelectasis or infiltrate in right midlung and right lower lobe. Follow-up to resolution is recommended. No pulmonary edema.   Electronically Signed   By: Lahoma Crocker M.D.   On: 03/10/2013 14:39      Medications: Scheduled: . hydrALAZINE  25 mg Intravenous BID  . levETIRAcetam  500 mg Oral BID  . methylPREDNISolone (SOLU-MEDROL) injection  40 mg Intravenous Q24H  . [START ON 03/14/2013] pantoprazole (PROTONIX) IV  40 mg Intravenous Q12H  . sodium chloride  3 mL Intravenous Q12H   Continuous: . sodium chloride 75 mL/hr at 03/11/13 0700  . pantoprozole (PROTONIX) infusion 8 mg/hr (03/11/13 1100)     Assessment/Plan: Active Problems:   Renal insufficiency   GI bleed   UGIB (upper gastrointestinal bleed)  UGIB - EGD c Dr Michail Sermon this am showed NO bleeding or blood products. Shallow ulceration noted at pyloric channel. Nonbleeding duodenal AVM seen and fulgurated. Clear bilious fluid seen. Needs to avoid all NSAIDs. Clear liquid diet and advance diet as tolerated.No additional GI workup needed right now unless bleeding recurs. Ok to go home tomorrow if stable on PO BID PPI.  Change IV PPI to PO.  Mobic stopped. She will need to stay on Prednisone/steroids to avoid adrenal insuff.  Seriel CBCs and follow Hbg. She is Hemodynamically stable. Transfer to tele and home tomorrow??  She is on Budesonide for ? Prior colitis.  Deh/azotemia - S/P Hydration  LE issues c bullous dermatopathy - Keep RLE wrapped as she is comfortable.  DVT Proph - cannot do SCDs due to her legs. Cannot do Anticoagulation due to the bleeding. With prior h/o DVt/PE - early ambulation is main gaol.  IBS.  H/O Breast CA s/p Rx.  Peripheral Neuropathy/Fibromyalgia - When taking POs - anything but NSAIDs or Cox 2  Lipids on Vytorin  Sz D/O on Keppra.  Anxiety/Depression - Cymbalta/Ativan  PMR on Chronic Prednisone    LOS: 1 day   Mariany Mackintosh M 03/11/2013, 11:27 AM

## 2013-03-11 NOTE — H&P (View-Only) (Signed)
Referring Provider: Dr. Virgina Jock Primary Care Physician:  Sheela Stack, MD Primary Gastroenterologist:  Dr. Watt Climes  Reason for Consultation:  GI bleed  HPI: [name] is a 79 y.o. female who had the acute onset of coffee grounds emesis (desribed as a large volume by her husband that nearly filled a basin at home) and multiple episodes of melena that started simultaneously at 2AM this morning. Denies any abdominal pain prior to the GI bleeding but has been nauseous for the past 3 weeks. Felt somewhat weak today. Denies hematochezia or bright red blood with the vomitus. Has taken Mobic chronically as well as Prednisone (for PMR). Denies any previous episodes of bleeding. Has a history of chronic diarrhea and a colonoscopy in 2013 showed microscopic colitis. Also has a history of IBS. Hgb 10.6 (11.8 in November 2014). She denies any melena since coming to the hospital and denies any coffee grounds emesis since 2AM. Small amount of melena noted on ER exam. History of breast cancer and last chemo in 2012.      Past Medical History  Diagnosis Date  . IBS (irritable bowel syndrome)   . Fibromyalgia   . History of DVT (deep vein thrombosis)   . History of pulmonary embolism   . Osteoporosis   . History of breast cancer LEFT BREAST DCIS  1996  . Hyperlipidemia   . 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 Breast cancer RIGHT SIDE-- DX OCT 2011    CHEMORADIATION THERAPY-- COMPLETED   . PONV (postoperative nausea and vomiting)   . Skin tear LEFT ARM COVERED W/ TEGADERM    PT STATES HX MULTIPLE SKIN TEARS -- THIN  . Thin skin SECONARDY AGE AND HX CHEMO  . Ecchymosis   . Seasonal allergies   . Frequency of urination   . Nocturia   . Anemia   . Lung mass PER DR CLANCE NOTE UNCLEAR IF LYMPHOMA FROM BX    FOLLOWED BY DR RUBIN  . Seizures   . Unspecified hereditary and idiopathic peripheral neuropathy   . Asthma   . Fibromyalgia   . Vertebral  compression fracture 5/14    L4    Past Surgical History  Procedure Laterality Date  . Total abdominal hysterectomy w/ bilateral salpingoophorectomy  1977  (APPROX)  . Cholecystectomy    . Appendectomy    . Femur fracture surgery  12-18-2010  DR BEANE    INTRAMEDULLARY NAILING LEFT INTERTROCHANTERIC -SUBTROCHANTERIC FX  . Right breast lumpectomy / removal lymph nodes x2/ removal pac  10-01-2010    CARCINOMA RIGHT BREAST  . Placement port- a- cath  02-18-2010  . Bronchoscopy  01-29-2010    W/ BX  . Tvt vaginal tape  suburethral sling   06-08-2005    SUI  . Bilateral laminectomy/ foraminotomy  01-14-2004    L4 - L5  . Breast lumpectomy  1996     LEFT BREAST DCIS   . Cataract extraction w/ intraocular lens  implant, bilateral    . Bilateral carpal tunnel release    . Tonsillectomy    . Transthoracic echocardiogram  02-25-2010    LVSF NORMAL/ EF 01-09%/ GRADE I DIASTOLIC DYSFUNCTION/ MILDLY DILATED LEFT ATRIUM  . Hardware removal  10/11/2011    Procedure: HARDWARE REMOVAL;  Surgeon: Johnn Hai, MD;  Location: [facility];  Service: Orthopedics;  Laterality: Left;  LEFT THIGH REMOVAL OF DISTAL LOCKING SCREW NEEDS: FLORO, RADIO LUSCENT TABLE AND SCREWDRIVER FOR BIOMET ROD   .  Colonoscopy      Prior to Admission medications   Medication Sig Start Date End Date Taking? Authorizing Provider  dexlansoprazole (DEXILANT) 60 MG capsule Take 60 mg by mouth daily as needed (for acid reflux).    Yes Historical Provider, MD  DULoxetine (CYMBALTA) 60 MG capsule Take 60 mg by mouth daily after lunch.    Yes Historical Provider, MD  ergocalciferol (VITAMIN D2) 50000 UNITS capsule Take 50,000 Units by mouth once a week. tuesday   Yes Historical Provider, MD  ezetimibe-simvastatin (VYTORIN) 10-80 MG per tablet Take 1 tablet by mouth at bedtime.    Yes Historical Provider, MD  gabapentin (NEURONTIN) 100 MG capsule Take 300 mg by mouth 3 (three) times daily.    Yes Historical  Provider, MD  HYDROmorphone (DILAUDID) 2 MG tablet Take 2 mg by mouth daily. 11/13/12  Yes Historical Provider, MD  hyoscyamine (LEVBID) 0.375 MG 12 hr tablet Take 1 tablet by mouth every 12 (twelve) hours. 07/10/12  Yes Historical Provider, MD  levETIRAcetam (KEPPRA) 500 MG tablet Take 1 tablet (500 mg total) by mouth 2 (two) times daily. 06/14/12  Yes Marcial Pacas, MD  levothyroxine (SYNTHROID, LEVOTHROID) 88 MCG tablet Take 88 mcg by mouth daily before breakfast.    Yes Historical Provider, MD  LORazepam (ATIVAN) 0.5 MG tablet Take 1 tablet (0.5 mg total) by mouth every 8 (eight) hours as needed. Anxiety 07/04/12  Yes Vivien Rota, NP  meloxicam (MOBIC) 15 MG tablet Take 15 mg by mouth every morning.  03/07/13  Yes Historical Provider, MD  metroNIDAZOLE (METROGEL) 1 % gel Apply 1 application topically daily. Applied to face for rosacea.   Yes Historical Provider, MD  MICRONIZED COLESTIPOL HCL 1 G tablet Take 1 tablet by mouth daily. 07/10/12  Yes Historical Provider, MD  oxyCODONE-acetaminophen (PERCOCET) 5-325 MG per tablet Take 1 tablet by mouth every 4 (four) hours as needed. 11/03/12  Yes Velna Hatchet, MD  predniSONE (DELTASONE) 5 MG tablet Take 1 tablet (5 mg total) by mouth daily with breakfast. 07/29/12  Yes Sheela Stack, MD  UCERIS 9 MG TB24 Take 1 capsule by mouth every 12 (twelve) hours. 07/10/12  Yes Historical Provider, MD    Scheduled Meds: . hydrALAZINE  25 mg Intravenous BID  . levETIRAcetam  500 mg Oral BID  . methylPREDNISolone (SOLU-MEDROL) injection  40 mg Intravenous Q24H  . [START ON 03/14/2013] pantoprazole (PROTONIX) IV  40 mg Intravenous Q12H  . sodium chloride  1,000 mL Intravenous STAT  . sodium chloride  3 mL Intravenous Q12H   Continuous Infusions: . sodium chloride 75 mL/hr at 03/10/13 1730  . pantoprozole (PROTONIX) infusion 8 mg/hr (03/10/13 1900)   PRN Meds:.HYDROmorphone (DILAUDID) injection, ondansetron (ZOFRAN) IV  Allergies as of 03/10/2013 - Review  Complete 03/10/2013  Allergen Reaction Noted  . Penicillins Anaphylaxis   . Adhesive [tape] Other (See Comments) 01/01/2011  . Doxycycline Nausea And Vomiting 10/21/2010  . Morphine and related Other (See Comments) 10/05/2011    Family History  Problem Relation Age of Onset  . Emphysema Father   . Ovarian cancer Mother   . Cancer Mother     cervical  . Breast cancer Maternal Grandmother   . Cancer Sister     cervical    History   Social History  . Marital Status: Married    Spouse Name: N/A    Number of Children: N/A  . Years of Education: N/A   Occupational History  . retired    Science writer  History Main Topics  . Smoking status: Former Smoker -- 1.00 packs/day for 30 years    Types: Cigarettes    Quit date: 03/01/1978  . Smokeless tobacco: Never Used  . Alcohol Use: 4.2 oz/week    7 Glasses of wine per week  . Drug Use: No  . Sexual Activity: No   Other Topics Concern  . Not on file   Social History Narrative  . No narrative on file    Review of Systems: All negative except as stated above in HPI.  Physical Exam: Vital signs: Filed Vitals:   03/10/13 1900  BP: 186/73  Pulse: 87  Temp: 98.3  Resp: 13   Last BM Date: 03/10/13 General:   Alert,  Well-developed, well-nourished, pleasant and cooperative in NAD HEENT: anicteric, dry mouth Neck: supple, nontender Lungs:  Clear throughout to auscultation.   No wheezes, crackles, or rhonchi. No acute distress. Heart:  Regular rate and rhythm; no murmurs, clicks, rubs,  or gallops. Abdomen: periumbilical and epigastric tenderness with guarding, soft, nondistended, +BS  Rectal:  Deferred Ext: LLE: bandaged and tender; RLE: tender with minimal edema  GI:  Lab Results:  Recent Labs  03/10/13 1413 03/10/13 1805  WBC 11.0* 8.7  HGB 10.7* 10.6*  HCT 33.7* 32.2*  PLT 220 212   BMET  Recent Labs  03/10/13 1413  NA 137  K 4.0  CL 99  CO2 25  GLUCOSE 80  BUN 23  CREATININE 1.32*  CALCIUM 8.8    LFT  Recent Labs  03/10/13 1413  PROT 6.1  ALBUMIN 2.8*  AST 10  ALT 8  ALKPHOS 92  BILITOT 0.5   PT/INR  Recent Labs  03/10/13 1413  LABPROT 12.8  INR 0.98     Studies/Results: Dg Chest 2 View  03/10/2013   CLINICAL DATA:  Shortness of breath, left rib pain  EXAM: CHEST  2 VIEW  COMPARISON:  03/14/2012  FINDINGS: Cardiomediastinal silhouette is stable. There is chronic elevation of the right hemidiaphragm. Surgical clips are noted left chest wall and left axilla. There is hazy atelectasis or infiltrate in right midlung and right lower lobe peripheral. No pulmonary edema. Bony thorax is stable. Small hiatal hernia.  IMPRESSION: Hazy peripheral atelectasis or infiltrate in right midlung and right lower lobe. Follow-up to resolution is recommended. No pulmonary edema.   Electronically Signed   By: Lahoma Crocker M.D.   On: 03/10/2013 14:39    Impression/Plan: 79 yo with melena and coffee grounds emesis likely due to a peptic ulcer bleed. No evidence of ongoing bleeding. Patient is dehydrated and receiving volume resuscitation. BP noted to be elevated. Agree with IV PPI Q 12 hours. Follow H/Hs but Hgb likely to drop following rehydration. Agree with ice chips. NPO p MN. EGD planned for tomorrow morning. Needs to avoid all NSAIDs. Defer whether to taper Prednisone to Dr. Burt Knack. Thank you for this consultation.    LOS: 0 days   [place] C.  03/10/2013, 7:38 PM

## 2013-03-11 NOTE — Interval H&P Note (Signed)
History and Physical Interval Note:  03/11/2013 9:40 AM  [place]  has presented today for surgery, with the diagnosis of gi bleed  The various methods of treatment have been discussed with the patient and family. After consideration of risks, benefits and other options for treatment, the patient has consented to  Procedure(s): ESOPHAGOGASTRODUODENOSCOPY (EGD) (N/A) as a surgical intervention .  The patient's history has been reviewed, patient examined, no change in status, stable for surgery.  I have reviewed the patient's chart and labs.  Questions were answered to the patient's satisfaction.     [place] C.

## 2013-03-11 NOTE — Brief Op Note (Addendum)
See Endopro note for details/recs. NO bleeding or blood products seen on insertion. Shallow ulceration noted at pyloric channel. Nonbleeding duodenal AVM seen and fulgurated. Clear bilious fluid seen. Needs to avoid all NSAIDs. Clear liquid diet and advance diet as tolerated. I do not think additional GI workup needed right now unless bleeding recurs. Ok to go home tomorrow if stable on PO BID PPI. Will follow.

## 2013-03-12 DIAGNOSIS — R52 Pain, unspecified: Secondary | ICD-10-CM | POA: Diagnosis not present

## 2013-03-12 DIAGNOSIS — K921 Melena: Secondary | ICD-10-CM | POA: Diagnosis not present

## 2013-03-12 DIAGNOSIS — E860 Dehydration: Secondary | ICD-10-CM | POA: Diagnosis not present

## 2013-03-12 DIAGNOSIS — K2970 Gastritis, unspecified, without bleeding: Secondary | ICD-10-CM | POA: Diagnosis not present

## 2013-03-12 DIAGNOSIS — N289 Disorder of kidney and ureter, unspecified: Secondary | ICD-10-CM | POA: Diagnosis not present

## 2013-03-12 DIAGNOSIS — K2960 Other gastritis without bleeding: Secondary | ICD-10-CM | POA: Diagnosis not present

## 2013-03-12 DIAGNOSIS — D649 Anemia, unspecified: Secondary | ICD-10-CM | POA: Diagnosis not present

## 2013-03-12 DIAGNOSIS — K31819 Angiodysplasia of stomach and duodenum without bleeding: Secondary | ICD-10-CM | POA: Diagnosis not present

## 2013-03-12 DIAGNOSIS — A048 Other specified bacterial intestinal infections: Secondary | ICD-10-CM | POA: Diagnosis not present

## 2013-03-12 DIAGNOSIS — K922 Gastrointestinal hemorrhage, unspecified: Secondary | ICD-10-CM | POA: Diagnosis not present

## 2013-03-12 LAB — BASIC METABOLIC PANEL
BUN: 20 mg/dL (ref 6–23)
CO2: 21 meq/L (ref 19–32)
CREATININE: 0.84 mg/dL (ref 0.50–1.10)
Calcium: 8.3 mg/dL — ABNORMAL LOW (ref 8.4–10.5)
Chloride: 102 mEq/L (ref 96–112)
GFR calc Af Amer: 76 mL/min — ABNORMAL LOW (ref >90)
GFR, EST NON AFRICAN AMERICAN: 66 mL/min — AB (ref >90)
Glucose, Bld: 101 mg/dL — ABNORMAL HIGH (ref 70–99)
Potassium: 5 mEq/L (ref 3.7–5.3)
Sodium: 134 mEq/L — ABNORMAL LOW (ref 137–147)

## 2013-03-12 LAB — CBC
HCT: 31.1 % — ABNORMAL LOW (ref 36.0–46.0)
Hemoglobin: 10 g/dL — ABNORMAL LOW (ref 12.0–15.0)
MCH: 29.8 pg (ref 26.0–34.0)
MCHC: 32.2 g/dL (ref 30.0–36.0)
MCV: 92.6 fL (ref 78.0–100.0)
Platelets: ADEQUATE 10*3/uL (ref 150–400)
RBC: 3.36 MIL/uL — AB (ref 3.87–5.11)
RDW: 14.4 % (ref 11.5–15.5)
WBC: 6.6 10*3/uL (ref 4.0–10.5)

## 2013-03-12 LAB — URINE CULTURE

## 2013-03-12 NOTE — Progress Notes (Signed)
eLink Physician-Brief Progress Note Patient Name: [name] DOB: Sep 27, 1934 MRN: [id number]  Date of Service  03/12/2013   HPI/Events of Note   Urine cult ure positive E Coli, U/A abnormal.  eICU Interventions  Will defer to primary team re whether to rx the ecoli in urine No fever or wbc elevation noted.   Intervention Category Intermediate Interventions: Infection - evaluation and management  Asencion Noble 03/12/2013, 4:32 PM

## 2013-03-12 NOTE — Progress Notes (Signed)
Subjective: Feeling fair. No BM. No nausea. Eating OK. No abd pain. Breathing OK. Still has back pain. Still weak and hasn't been up any  Objective: Vital signs in last 24 hours: Temp:  [97.5 F (36.4 C)-98.5 F (36.9 C)] 98.5 F (36.9 C) (01/12 0400) Pulse Rate:  [78-96] 82 (01/12 0400) Resp:  [11-24] 12 (01/12 0400) BP: (86-175)/(25-117) 118/51 mmHg (01/12 0532) SpO2:  [94 %-100 %] 97 % (01/12 0400) Weight:  [79.561 kg (175 lb 6.4 oz)] 79.561 kg (175 lb 6.4 oz) (01/12 0000)  Intake/Output from previous day: 01/11 0701 - 01/12 0700 In: 1420 [P.O.:480; I.V.:940] Out: 1050 [Urine:1050] Intake/Output this shift:    Weak but nontoxic. Anicteric. Neck supple. Lungs clear, distant. Ht regular. Abd Soft NT, 1+ edema, awake. Alert clear speech  Lab Results   Recent Labs  03/11/13 0336 03/12/13 0355  WBC 8.3 6.6  RBC 3.97 3.36*  HGB 11.7* 10.0*  HCT 36.4 31.1*  MCV 91.7 92.6  MCH 29.5 29.8  RDW 14.2 14.4  PLT 253 PLATELET CLUMPS NOTED ON SMEAR, COUNT APPEARS ADEQUATE    Recent Labs  03/11/13 0336 03/12/13 0355  NA 138 134*  K 4.0 5.0  CL 103 102  CO2 20 21  GLUCOSE 114* 101*  BUN 15 20  CREATININE 0.75 0.84  CALCIUM 8.4 8.3*    Studies/Results: Dg Chest 2 View  03/10/2013   CLINICAL DATA:  Shortness of breath, left rib pain  EXAM: CHEST  2 VIEW  COMPARISON:  03/14/2012  FINDINGS: Cardiomediastinal silhouette is stable. There is chronic elevation of the right hemidiaphragm. Surgical clips are noted left chest wall and left axilla. There is hazy atelectasis or infiltrate in right midlung and right lower lobe peripheral. No pulmonary edema. Bony thorax is stable. Small hiatal hernia.  IMPRESSION: Hazy peripheral atelectasis or infiltrate in right midlung and right lower lobe. Follow-up to resolution is recommended. No pulmonary edema.   Electronically Signed   By: Lahoma Crocker M.D.   On: 03/10/2013 14:39    Scheduled Meds: . budesonide  9 mg Oral Daily  . DULoxetine   60 mg Oral QPC lunch  . gabapentin  300 mg Oral TID  . hyoscyamine  0.375 mg Oral Q12H  . levETIRAcetam  500 mg Oral BID  . levothyroxine  88 mcg Oral QAC breakfast  . pantoprazole  40 mg Oral BID AC  . predniSONE  5 mg Oral Q breakfast  . sodium chloride  3 mL Intravenous Q12H   Continuous Infusions: . sodium chloride 40 mL/hr at 03/12/13 0257   PRN Meds:LORazepam, ondansetron (ZOFRAN) IV, oxyCODONE-acetaminophen  Assessment/Plan:  UGI BLEED: hgb down to 10. No overt bleeding noted ANEMIA: As above ADRENAL INSUFFIC: on Rx HYPOTHYROID: on rx UNSTABLE GAIT/WEAKNESS: needs ambulation NEUROPATHY: doing OK DEPRESSION : doing OK COMPRESSION FX: still has pain      Date Noted  01/23/2010      LOS: 2 days   [name] [name] 03/12/2013, 8:45 AM

## 2013-03-12 NOTE — Progress Notes (Signed)
Nutrition Brief Note  Patient identified on the Malnutrition Screening Tool (MST) Report  Wt Readings from Last 3 Encounters:  03/12/13 175 lb 6.4 oz (79.561 kg)  03/12/13 175 lb 6.4 oz (79.561 kg)  01/04/13 177 lb (80.287 kg)    Body mass index is 29.19 kg/(m^2). Patient meets criteria for overweight based on current BMI.   Current diet order is regular, patient is consuming approximately 50% of meals at this time. Labs and medications reviewed. Pt with IBS, fibromyalgia, and breast CA. Had low grade nausea for 2-3 weeks. Awoke at 2 am day of admission with c/o N/V, coffee ground emesis, and melana. Found to have a GI bleed.   Met with pt who reports eating small amounts of food and snacks for the past 2-3 weeks due to nausea. States she lost 4 pounds but gained it back already. Has chronic diarrhea, 8 times/day per pt, due to IBS. Pt reports she tries to stay hydrated at home. Denies any nausea/vomiting today and reports eating a good breakfast and lunch.   No nutrition interventions warranted at this time. If nutrition issues arise, please consult RD.   Mikey College MS, [place], [place] Pager [phone] After Hours Pager

## 2013-03-12 NOTE — Progress Notes (Signed)
Patient ID: [name], female   DOB: 01-26-1935, 79 y.o.   MRN: [id number] [facility] Progress Note  [name] 79 y.o. 1934/08/04   Subjective: Feels fine. Brown stool. Denies abdominal pain/N/V. On regular diet. Husband in room.  Objective: Vital signs in last 24 hours: Filed Vitals:   03/12/13 0800  BP: 139/66  Pulse: 80  Temp: 98 F (36.7 C)  Resp: 11    Physical Exam: Gen: alert, no acute distress Abd: diffusely tender with voluntary guarding, soft, nondistended, +BS  Lab Results:  Recent Labs  03/11/13 0336 03/12/13 0355  NA 138 134*  K 4.0 5.0  CL 103 102  CO2 20 21  GLUCOSE 114* 101*  BUN 15 20  CREATININE 0.75 0.84  CALCIUM 8.4 8.3*    Recent Labs  03/10/13 1413  AST 10  ALT 8  ALKPHOS 92  BILITOT 0.5  PROT 6.1  ALBUMIN 2.8*    Recent Labs  03/10/13 1413  03/11/13 0336 03/12/13 0355  WBC 11.0*  < > 8.3 6.6  NEUTROABS 7.4  --   --   --   HGB 10.7*  < > 11.7* 10.0*  HCT 33.7*  < > 36.4 31.1*  MCV 93.1  < > 91.7 92.6  PLT 220  < > 253 PLATELET CLUMPS NOTED ON SMEAR, COUNT APPEARS ADEQUATE  < > = values in this interval not displayed.  Recent Labs  03/10/13 1413  LABPROT 12.8  INR 0.98      Assessment/Plan: 79 yo s/p upper GI bleed that has resolved. Abdominal pain likely functional. Ok to go home tomorrow from GI standpoint if Hgb stable. Will f/u on path as outpt. Needs to avoid all NSAIDs. Take PPI PO BID for 1 month and then QD. F/U with Dr. Watt Climes in 1 month.  Will sign off. Call if questions.   [place] C. 03/12/2013, 12:59 PM

## 2013-03-13 ENCOUNTER — Encounter (HOSPITAL_COMMUNITY): Payer: Self-pay | Admitting: Gastroenterology

## 2013-03-13 DIAGNOSIS — D649 Anemia, unspecified: Secondary | ICD-10-CM | POA: Diagnosis not present

## 2013-03-13 DIAGNOSIS — K922 Gastrointestinal hemorrhage, unspecified: Secondary | ICD-10-CM | POA: Diagnosis not present

## 2013-03-13 DIAGNOSIS — E860 Dehydration: Secondary | ICD-10-CM | POA: Diagnosis not present

## 2013-03-13 DIAGNOSIS — N289 Disorder of kidney and ureter, unspecified: Secondary | ICD-10-CM | POA: Diagnosis not present

## 2013-03-13 DIAGNOSIS — R52 Pain, unspecified: Secondary | ICD-10-CM | POA: Diagnosis not present

## 2013-03-13 LAB — CBC
HEMATOCRIT: 32.1 % — AB (ref 36.0–46.0)
HEMOGLOBIN: 10.2 g/dL — AB (ref 12.0–15.0)
MCH: 29.7 pg (ref 26.0–34.0)
MCHC: 31.8 g/dL (ref 30.0–36.0)
MCV: 93.3 fL (ref 78.0–100.0)
Platelets: 234 10*3/uL (ref 150–400)
RBC: 3.44 MIL/uL — ABNORMAL LOW (ref 3.87–5.11)
RDW: 14.3 % (ref 11.5–15.5)
WBC: 6.3 10*3/uL (ref 4.0–10.5)

## 2013-03-13 LAB — COMPREHENSIVE METABOLIC PANEL
ALBUMIN: 2.4 g/dL — AB (ref 3.5–5.2)
ALT: 17 U/L (ref 0–35)
AST: 17 U/L (ref 0–37)
Alkaline Phosphatase: 101 U/L (ref 39–117)
BUN: 19 mg/dL (ref 6–23)
CALCIUM: 8.3 mg/dL — AB (ref 8.4–10.5)
CO2: 23 mEq/L (ref 19–32)
Chloride: 105 mEq/L (ref 96–112)
Creatinine, Ser: 0.84 mg/dL (ref 0.50–1.10)
GFR calc non Af Amer: 66 mL/min — ABNORMAL LOW (ref >90)
GFR, EST AFRICAN AMERICAN: 76 mL/min — AB (ref >90)
GLUCOSE: 98 mg/dL (ref 70–99)
Potassium: 4.1 mEq/L (ref 3.7–5.3)
Sodium: 140 mEq/L (ref 137–147)
Total Bilirubin: <0.2 mg/dL — ABNORMAL LOW (ref 0.3–1.2)
Total Protein: 5.6 g/dL — ABNORMAL LOW (ref 6.0–8.3)

## 2013-03-13 MED ORDER — CIPROFLOXACIN HCL 500 MG PO TABS: 500.0000 mg | ORAL_TABLET | Freq: Two times a day (BID) | ORAL | Status: DC

## 2013-03-13 MED ORDER — CLONIDINE HCL 0.1 MG PO TABS
0.1000 mg | ORAL_TABLET | Freq: Once | ORAL | Status: AC
  Administered 2013-03-13: 0.1 mg via ORAL
  Filled 2013-03-13: qty 1

## 2013-03-13 NOTE — Progress Notes (Signed)
D/C home with husband

## 2013-03-13 NOTE — Discharge Summary (Signed)
DISCHARGE SUMMARY  [name]  MR#: [id number]  DOB:1934/07/04  Date of Admission: 03/10/2013 Date of Discharge: 03/13/2013  Attending Physician:[name]  Patient's MI:[id number] Kelsey Haste, MD  Consults: [facility]  Discharge Diagnoses: Principal Problem:   GI bleed, acute gastritis. Pyloric ulcer, duodenal AVM Active Problems:   Acute blood loss anemia. stable   Adrenal insufficiency   Unsteady gait   Renal insufficiency  Hypertension E coli UTI Protein calorie malnutrition Hx Breast CA Hyperlipidemia Depression Seizure disorder Osteoporosis with compression fx and back pain  Discharge Medications:   Medication List    STOP taking these medications       meloxicam 15 MG tablet  Commonly known as:  MOBIC      TAKE these medications       ciprofloxacin 500 MG tablet  Commonly known as:  CIPRO  Take 1 tablet (500 mg total) by mouth 2 (two) times daily.     DEXILANT 60 MG capsule  Generic drug:  dexlansoprazole  Take 60 mg by mouth daily as needed (for acid reflux).     DULoxetine 60 MG capsule  Commonly known as:  CYMBALTA  Take 60 mg by mouth daily after lunch.     ergocalciferol 50000 UNITS capsule  Commonly known as:  VITAMIN D2  Take 50,000 Units by mouth once a week. tuesday     ezetimibe-simvastatin 10-80 MG per tablet  Commonly known as:  VYTORIN  Take 1 tablet by mouth at bedtime.     gabapentin 100 MG capsule  Commonly known as:  NEURONTIN  Take 300 mg by mouth 3 (three) times daily.     HYDROmorphone 2 MG tablet  Commonly known as:  DILAUDID  Take 2 mg by mouth daily.     hyoscyamine 0.375 MG 12 hr tablet  Commonly known as:  LEVBID  Take 1 tablet by mouth every 12 (twelve) hours.     levETIRAcetam 500 MG tablet  Commonly known as:  KEPPRA  Take 1 tablet (500 mg total) by mouth 2 (two) times daily.     levothyroxine 88 MCG tablet  Commonly known as:  SYNTHROID, LEVOTHROID  Take 88 mcg by mouth daily before  breakfast.     LORazepam 0.5 MG tablet  Commonly known as:  ATIVAN  Take 1 tablet (0.5 mg total) by mouth every 8 (eight) hours as needed. Anxiety     metroNIDAZOLE 1 % gel  Commonly known as:  METROGEL  Apply 1 application topically daily. Applied to face for rosacea.     MICRONIZED COLESTIPOL HCL 1 G tablet  Generic drug:  colestipol  Take 1 tablet by mouth daily.     oxyCODONE-acetaminophen 5-325 MG per tablet  Commonly known as:  PERCOCET  Take 1 tablet by mouth every 4 (four) hours as needed.     predniSONE 5 MG tablet  Commonly known as:  DELTASONE  Take 1 tablet (5 mg total) by mouth daily with breakfast.     UCERIS 9 MG Tb24  Generic drug:  Budesonide  Take 9 mg by mouth every morning.        Hospital Procedures: Dg Chest 2 View  03/10/2013   CLINICAL DATA:  Shortness of breath, left rib pain  EXAM: CHEST  2 VIEW  COMPARISON:  03/14/2012  FINDINGS: Cardiomediastinal silhouette is stable. There is chronic elevation of the right hemidiaphragm. Surgical clips are noted left chest wall and left axilla. There is hazy atelectasis or infiltrate in right midlung and right lower lobe  peripheral. No pulmonary edema. Bony thorax is stable. Small hiatal hernia.  IMPRESSION: Hazy peripheral atelectasis or infiltrate in right midlung and right lower lobe. Follow-up to resolution is recommended. No pulmonary edema.   Electronically Signed   By: [name] M.D.   On: 03/10/2013 14:39    History of Present Illness: Dark stools and vomiting  Hospital Course: 79 YO WF with complex medical hx presented with dark stools and emesis. Was found to have UGI from duodenal source. No active bleeding on EGD. Has kept a steady Hgb (other than one abberant meaurement of 11.7) and no hypotension. Stools now brown and tolerating a regular diet. No abd pain. No CP or SOB. Some hot and cold spells last night. Noted to have temp of 99 with EColi in urine.  Back pain is at baseline. mobic may have  contributed to GI Bleeding. BP up overnight but trending down with 164/88 by manual undersized cuff on left arm this AM. No neuro sxs  In summary we have a 78 YO with a stable improving UGI bleed due to a duodenal source. Will continue on PPI and hold mobic  Day of Discharge Exam BP 175/89  Pulse 77  Temp(Src) 97.5 F (36.4 C) (Oral)  Resp 14  Ht 5\' 5"  (1.651 m)  Wt 79.561 kg (175 lb 6.4 oz)  BMI 29.19 kg/m2  SpO2 98%  Physical Exam: General appearance: chronically ill but in no distress. Face symmetric, oral membranes moist  Resp: clear with no wheezes rales or rhonchi, no accessory ms in use Cardio: regular with skips, sys murmur GI: soft, non-tender; bowel sounds normal; no masses,  no organomegaly Extremities: no clubbing, cyanosis or edema Skin thin with bruising. Skin tear left upper arm Awake, alert.. mentating well, sl tremor  Discharge Labs:  Recent Labs  03/12/13 0355 03/13/13 0352  NA 134* 140  K 5.0 4.1  CL 102 105  CO2 21 23  GLUCOSE 101* 98  BUN 20 19  CREATININE 0.84 0.84  CALCIUM 8.3* 8.3*    Recent Labs  03/10/13 1413 03/13/13 0352  AST 10 17  ALT 8 17  ALKPHOS 92 101  BILITOT 0.5 <0.2*  PROT 6.1 5.6*  ALBUMIN 2.8* 2.4*  Results for Kelsey, [name] (MRN [id number]) as of 03/13/2013 08:11  Ref. Range 03/13/2013 03:52  Platelets Latest Range: 150-400 K/uL 234    Recent Labs  03/10/13 1413  03/12/13 0355 03/13/13 0352  WBC 11.0*  < > 6.6 6.3  NEUTROABS 7.4  --   --   --   HGB 10.7*  < > 10.0* 10.2*  HCT 33.7*  < > 31.1* 32.1*  MCV 93.1  < > 92.6 93.3  PLT 220  < > PLATELET CLUMPS NOTED ON SMEAR, COUNT APPEARS ADEQUATE 234  < > = values in this interval not displayed. Lab Results  Component Value Date   INR 0.98 03/10/2013   INR 0.99 10/31/2012   INR 0.94 12/18/2010    Recent Labs  03/10/13 1413  TROPONINI <0.30   EKG  SR, non ischemic   Discharge instructions:      Future Appointments Provider Department Dept Phone    06/05/2013 1:30 PM Marcial Pacas, MD [facility] [phone]   06/29/2013 1:45 PM [facility] [phone]   06/29/2013 2:15 PM Amy Milda Smart, PA-C [facility] [phone]      Disposition: to home  Follow-up Appts: Follow-up with Dr.  [place] at [facility] in 1 week.  Call for appointment.  Condition on Discharge: stable and improved  Tests Needing Follow-up: Pathology from eGD  Signed: [place] 03/13/2013, 8:01 AM

## 2013-03-14 DIAGNOSIS — I87339 Chronic venous hypertension (idiopathic) with ulcer and inflammation of unspecified lower extremity: Secondary | ICD-10-CM | POA: Diagnosis not present

## 2013-03-14 DIAGNOSIS — L97909 Non-pressure chronic ulcer of unspecified part of unspecified lower leg with unspecified severity: Secondary | ICD-10-CM | POA: Diagnosis not present

## 2013-03-20 DIAGNOSIS — M810 Age-related osteoporosis without current pathological fracture: Secondary | ICD-10-CM | POA: Diagnosis not present

## 2013-03-20 DIAGNOSIS — M47817 Spondylosis without myelopathy or radiculopathy, lumbosacral region: Secondary | ICD-10-CM | POA: Diagnosis not present

## 2013-03-20 DIAGNOSIS — M62838 Other muscle spasm: Secondary | ICD-10-CM | POA: Diagnosis not present

## 2013-03-20 DIAGNOSIS — S32009A Unspecified fracture of unspecified lumbar vertebra, initial encounter for closed fracture: Secondary | ICD-10-CM | POA: Diagnosis not present

## 2013-03-20 DIAGNOSIS — Z79899 Other long term (current) drug therapy: Secondary | ICD-10-CM | POA: Diagnosis not present

## 2013-03-21 DIAGNOSIS — L97909 Non-pressure chronic ulcer of unspecified part of unspecified lower leg with unspecified severity: Secondary | ICD-10-CM | POA: Diagnosis not present

## 2013-03-21 DIAGNOSIS — I87339 Chronic venous hypertension (idiopathic) with ulcer and inflammation of unspecified lower extremity: Secondary | ICD-10-CM | POA: Diagnosis not present

## 2013-04-02 ENCOUNTER — Ambulatory Visit: Payer: Medicare Other

## 2013-04-03 ENCOUNTER — Telehealth: Payer: Self-pay | Admitting: *Deleted

## 2013-04-03 NOTE — Telephone Encounter (Signed)
sw pt informed her that AGB will be on pal 06/29/13. gv appt for 06/28/13 w/labs@ 1:30pm and ov @ 2p. Pt is aware...td

## 2013-04-09 ENCOUNTER — Inpatient Hospital Stay (HOSPITAL_COMMUNITY)
Admission: EM | Admit: 2013-04-09 | Discharge: 2013-04-17 | DRG: 193 | Disposition: A | Discharge status: Skilled Nursing Facility | Payer: Medicare Other | Attending: Endocrinology | Admitting: Endocrinology

## 2013-04-09 ENCOUNTER — Encounter (HOSPITAL_COMMUNITY): Payer: Self-pay | Admitting: Emergency Medicine

## 2013-04-09 DIAGNOSIS — R600 Localized edema: Secondary | ICD-10-CM

## 2013-04-09 DIAGNOSIS — M6281 Muscle weakness (generalized): Secondary | ICD-10-CM | POA: Diagnosis not present

## 2013-04-09 DIAGNOSIS — E785 Hyperlipidemia, unspecified: Secondary | ICD-10-CM | POA: Diagnosis present

## 2013-04-09 DIAGNOSIS — M810 Age-related osteoporosis without current pathological fracture: Secondary | ICD-10-CM | POA: Diagnosis present

## 2013-04-09 DIAGNOSIS — E876 Hypokalemia: Secondary | ICD-10-CM | POA: Diagnosis present

## 2013-04-09 DIAGNOSIS — I82409 Acute embolism and thrombosis of unspecified deep veins of unspecified lower extremity: Secondary | ICD-10-CM | POA: Diagnosis not present

## 2013-04-09 DIAGNOSIS — Z853 Personal history of malignant neoplasm of breast: Secondary | ICD-10-CM

## 2013-04-09 DIAGNOSIS — J159 Unspecified bacterial pneumonia: Secondary | ICD-10-CM | POA: Diagnosis not present

## 2013-04-09 DIAGNOSIS — R262 Difficulty in walking, not elsewhere classified: Secondary | ICD-10-CM | POA: Diagnosis not present

## 2013-04-09 DIAGNOSIS — G40909 Epilepsy, unspecified, not intractable, without status epilepticus: Secondary | ICD-10-CM | POA: Diagnosis present

## 2013-04-09 DIAGNOSIS — R509 Fever, unspecified: Secondary | ICD-10-CM | POA: Diagnosis not present

## 2013-04-09 DIAGNOSIS — N390 Urinary tract infection, site not specified: Secondary | ICD-10-CM | POA: Diagnosis not present

## 2013-04-09 DIAGNOSIS — R51 Headache: Secondary | ICD-10-CM | POA: Diagnosis not present

## 2013-04-09 DIAGNOSIS — A048 Other specified bacterial intestinal infections: Secondary | ICD-10-CM | POA: Diagnosis present

## 2013-04-09 DIAGNOSIS — F329 Major depressive disorder, single episode, unspecified: Secondary | ICD-10-CM | POA: Diagnosis present

## 2013-04-09 DIAGNOSIS — R5381 Other malaise: Secondary | ICD-10-CM | POA: Diagnosis not present

## 2013-04-09 DIAGNOSIS — IMO0001 Reserved for inherently not codable concepts without codable children: Secondary | ICD-10-CM | POA: Diagnosis present

## 2013-04-09 DIAGNOSIS — R627 Adult failure to thrive: Secondary | ICD-10-CM | POA: Diagnosis present

## 2013-04-09 DIAGNOSIS — R5383 Other fatigue: Secondary | ICD-10-CM

## 2013-04-09 DIAGNOSIS — R112 Nausea with vomiting, unspecified: Secondary | ICD-10-CM | POA: Diagnosis present

## 2013-04-09 DIAGNOSIS — R609 Edema, unspecified: Secondary | ICD-10-CM | POA: Diagnosis present

## 2013-04-09 DIAGNOSIS — R269 Unspecified abnormalities of gait and mobility: Secondary | ICD-10-CM | POA: Diagnosis present

## 2013-04-09 DIAGNOSIS — F3289 Other specified depressive episodes: Secondary | ICD-10-CM | POA: Diagnosis present

## 2013-04-09 DIAGNOSIS — K219 Gastro-esophageal reflux disease without esophagitis: Secondary | ICD-10-CM | POA: Diagnosis not present

## 2013-04-09 DIAGNOSIS — Z803 Family history of malignant neoplasm of breast: Secondary | ICD-10-CM

## 2013-04-09 DIAGNOSIS — B999 Unspecified infectious disease: Secondary | ICD-10-CM | POA: Diagnosis not present

## 2013-04-09 DIAGNOSIS — K2960 Other gastritis without bleeding: Secondary | ICD-10-CM | POA: Diagnosis not present

## 2013-04-09 DIAGNOSIS — J189 Pneumonia, unspecified organism: Secondary | ICD-10-CM | POA: Diagnosis not present

## 2013-04-09 DIAGNOSIS — E46 Unspecified protein-calorie malnutrition: Secondary | ICD-10-CM | POA: Diagnosis present

## 2013-04-09 DIAGNOSIS — E2740 Unspecified adrenocortical insufficiency: Secondary | ICD-10-CM | POA: Diagnosis present

## 2013-04-09 DIAGNOSIS — D62 Acute posthemorrhagic anemia: Secondary | ICD-10-CM | POA: Diagnosis present

## 2013-04-09 DIAGNOSIS — L0390 Cellulitis, unspecified: Secondary | ICD-10-CM | POA: Diagnosis present

## 2013-04-09 DIAGNOSIS — R0902 Hypoxemia: Secondary | ICD-10-CM | POA: Diagnosis not present

## 2013-04-09 DIAGNOSIS — I872 Venous insufficiency (chronic) (peripheral): Secondary | ICD-10-CM | POA: Diagnosis present

## 2013-04-09 DIAGNOSIS — I10 Essential (primary) hypertension: Secondary | ICD-10-CM | POA: Diagnosis present

## 2013-04-09 DIAGNOSIS — Z87891 Personal history of nicotine dependence: Secondary | ICD-10-CM

## 2013-04-09 DIAGNOSIS — E1149 Type 2 diabetes mellitus with other diabetic neurological complication: Secondary | ICD-10-CM | POA: Diagnosis present

## 2013-04-09 DIAGNOSIS — R569 Unspecified convulsions: Secondary | ICD-10-CM | POA: Diagnosis not present

## 2013-04-09 DIAGNOSIS — E2749 Other adrenocortical insufficiency: Secondary | ICD-10-CM | POA: Diagnosis present

## 2013-04-09 DIAGNOSIS — E039 Hypothyroidism, unspecified: Secondary | ICD-10-CM | POA: Diagnosis present

## 2013-04-09 DIAGNOSIS — L259 Unspecified contact dermatitis, unspecified cause: Secondary | ICD-10-CM | POA: Diagnosis present

## 2013-04-09 DIAGNOSIS — E871 Hypo-osmolality and hyponatremia: Secondary | ICD-10-CM | POA: Diagnosis not present

## 2013-04-09 DIAGNOSIS — Z8041 Family history of malignant neoplasm of ovary: Secondary | ICD-10-CM

## 2013-04-09 DIAGNOSIS — I379 Nonrheumatic pulmonary valve disorder, unspecified: Secondary | ICD-10-CM | POA: Diagnosis not present

## 2013-04-09 DIAGNOSIS — K5900 Constipation, unspecified: Secondary | ICD-10-CM | POA: Diagnosis present

## 2013-04-09 DIAGNOSIS — Z8711 Personal history of peptic ulcer disease: Secondary | ICD-10-CM

## 2013-04-09 DIAGNOSIS — R4182 Altered mental status, unspecified: Secondary | ICD-10-CM | POA: Diagnosis present

## 2013-04-09 DIAGNOSIS — IMO0002 Reserved for concepts with insufficient information to code with codable children: Secondary | ICD-10-CM

## 2013-04-09 DIAGNOSIS — M2550 Pain in unspecified joint: Secondary | ICD-10-CM | POA: Diagnosis not present

## 2013-04-09 DIAGNOSIS — M7989 Other specified soft tissue disorders: Secondary | ICD-10-CM | POA: Diagnosis not present

## 2013-04-09 DIAGNOSIS — E669 Obesity, unspecified: Secondary | ICD-10-CM | POA: Diagnosis present

## 2013-04-09 DIAGNOSIS — K589 Irritable bowel syndrome without diarrhea: Secondary | ICD-10-CM | POA: Diagnosis present

## 2013-04-09 DIAGNOSIS — Z87311 Personal history of (healed) other pathological fracture: Secondary | ICD-10-CM

## 2013-04-09 DIAGNOSIS — I824Y9 Acute embolism and thrombosis of unspecified deep veins of unspecified proximal lower extremity: Secondary | ICD-10-CM | POA: Diagnosis present

## 2013-04-09 DIAGNOSIS — R531 Weakness: Secondary | ICD-10-CM

## 2013-04-09 DIAGNOSIS — J90 Pleural effusion, not elsewhere classified: Secondary | ICD-10-CM | POA: Diagnosis not present

## 2013-04-09 DIAGNOSIS — E1142 Type 2 diabetes mellitus with diabetic polyneuropathy: Secondary | ICD-10-CM | POA: Diagnosis not present

## 2013-04-09 DIAGNOSIS — E43 Unspecified severe protein-calorie malnutrition: Secondary | ICD-10-CM | POA: Diagnosis present

## 2013-04-09 DIAGNOSIS — R110 Nausea: Secondary | ICD-10-CM | POA: Diagnosis not present

## 2013-04-09 DIAGNOSIS — R0602 Shortness of breath: Secondary | ICD-10-CM | POA: Diagnosis not present

## 2013-04-09 DIAGNOSIS — N289 Disorder of kidney and ureter, unspecified: Secondary | ICD-10-CM | POA: Diagnosis present

## 2013-04-09 DIAGNOSIS — G4733 Obstructive sleep apnea (adult) (pediatric): Secondary | ICD-10-CM | POA: Diagnosis present

## 2013-04-09 DIAGNOSIS — R279 Unspecified lack of coordination: Secondary | ICD-10-CM | POA: Diagnosis not present

## 2013-04-09 DIAGNOSIS — R404 Transient alteration of awareness: Secondary | ICD-10-CM | POA: Diagnosis not present

## 2013-04-09 DIAGNOSIS — A498 Other bacterial infections of unspecified site: Secondary | ICD-10-CM | POA: Diagnosis present

## 2013-04-09 DIAGNOSIS — Z6827 Body mass index (BMI) 27.0-27.9, adult: Secondary | ICD-10-CM

## 2013-04-09 LAB — POCT I-STAT, CHEM 8
BUN: 12 mg/dL (ref 6–23)
CREATININE: 1.2 mg/dL — AB (ref 0.50–1.10)
Calcium, Ion: 1.02 mmol/L — ABNORMAL LOW (ref 1.13–1.30)
Chloride: 92 mEq/L — ABNORMAL LOW (ref 96–112)
GLUCOSE: 100 mg/dL — AB (ref 70–99)
HCT: 34 % — ABNORMAL LOW (ref 36.0–46.0)
Hemoglobin: 11.6 g/dL — ABNORMAL LOW (ref 12.0–15.0)
Potassium: 3.2 mEq/L — ABNORMAL LOW (ref 3.7–5.3)
Sodium: 129 mEq/L — ABNORMAL LOW (ref 137–147)
TCO2: 23 mmol/L (ref 0–100)

## 2013-04-09 LAB — CBC
HCT: 32.3 % — ABNORMAL LOW (ref 36.0–46.0)
HEMOGLOBIN: 10.5 g/dL — AB (ref 12.0–15.0)
MCH: 28 pg (ref 26.0–34.0)
MCHC: 32.5 g/dL (ref 30.0–36.0)
MCV: 86.1 fL (ref 78.0–100.0)
Platelets: 225 10*3/uL (ref 150–400)
RBC: 3.75 MIL/uL — AB (ref 3.87–5.11)
RDW: 14.8 % (ref 11.5–15.5)
WBC: 13.7 10*3/uL — ABNORMAL HIGH (ref 4.0–10.5)

## 2013-04-09 MED ORDER — SODIUM CHLORIDE 0.9 % IV BOLUS (SEPSIS)
1000.0000 mL | Freq: Once | INTRAVENOUS | Status: AC
  Administered 2013-04-09: 1000 mL via INTRAVENOUS

## 2013-04-09 MED ORDER — SODIUM CHLORIDE 0.9 % IV SOLN
Freq: Once | INTRAVENOUS | Status: AC
  Administered 2013-04-10: 04:00:00 via INTRAVENOUS

## 2013-04-09 NOTE — ED Notes (Addendum)
Pt from home with generalized weakness, LLE swelling, nausea. A+Ox4, skin pwd.    138ml NS 4mg  Zofran - relieved nasuea

## 2013-04-09 NOTE — ED Provider Notes (Signed)
CSN: [id number]     Arrival date & time 04/09/13  2050 History   First MD Initiated Contact with Patient 04/09/13 2338     Chief Complaint  Patient presents with  . Weakness     (Consider location/radiation/quality/duration/timing/severity/associated sxs/prior Treatment) HPI Comments: Pt is 79 y/o female - presents to the ED with c/o weakness, has not had any urine output since this AM - she has had very little to eat and drink - the weakness in generalized, nothing makes this better or worse - she has chronic pain related to hx of a fall and fracture.  She has not ambulated by herself in months.  She uses a walker but requires additional assistance.  Noted to have some swelling in the legs - L>R, has been to the wound care center for this (with blisters).  ROS, Has been having chills, there has been 2-3 episodes of vomiting today.  Appears to be yellow stomach contents.  No diarrhea.  She has not been coughing.  The weakness has been gradually getting worse over the last week, the timeframe is somewhat hazy but may be low as long as several months. Unfortunately she has decompensated to the point where she cannot walk by herself and requires significant amount of assistance. She has multiple episodes of vomiting today she got Zofran by paramedics which relieved her nausea.  Patient is a 79 y.o. female presenting with weakness. The history is provided by the patient and a relative.  Weakness    Past Medical History  Diagnosis Date  . IBS (irritable bowel syndrome)   . Fibromyalgia   . History of DVT (deep vein thrombosis)   . History of pulmonary embolism   . Osteoporosis   . History of breast cancer LEFT BREAST DCIS  1996  . Hyperlipidemia   . 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 Breast cancer RIGHT SIDE-- DX OCT 2011    CHEMORADIATION THERAPY-- COMPLETED   . PONV (postoperative nausea and vomiting)   . Skin tear LEFT ARM COVERED W/ TEGADERM     PT STATES HX MULTIPLE SKIN TEARS -- THIN  . Thin skin SECONARDY AGE AND HX CHEMO  . Ecchymosis   . Seasonal allergies   . Frequency of urination   . Nocturia   . Anemia   . Lung mass PER DR CLANCE NOTE UNCLEAR IF LYMPHOMA FROM BX    FOLLOWED BY DR RUBIN  . Seizures   . Unspecified hereditary and idiopathic peripheral neuropathy   . Asthma   . Fibromyalgia   . Vertebral compression fracture 5/14    L4   Past Surgical History  Procedure Laterality Date  . Total abdominal hysterectomy w/ bilateral salpingoophorectomy  1977  (APPROX)  . Cholecystectomy    . Appendectomy    . Femur fracture surgery  12-18-2010  DR BEANE    INTRAMEDULLARY NAILING LEFT INTERTROCHANTERIC -SUBTROCHANTERIC FX  . Right breast lumpectomy / removal lymph nodes x2/ removal pac  10-01-2010    CARCINOMA RIGHT BREAST  . Placement port- a- cath  02-18-2010  . Bronchoscopy  01-29-2010    W/ BX  . Tvt vaginal tape  suburethral sling   06-08-2005    SUI  . Bilateral laminectomy/ foraminotomy  01-14-2004    L4 - L5  . Breast lumpectomy  1996     LEFT BREAST DCIS   . Cataract extraction w/ intraocular lens  implant, bilateral    . Bilateral carpal tunnel release    .  Tonsillectomy    . Transthoracic echocardiogram  02-25-2010    LVSF NORMAL/ EF 78-46%/ GRADE I DIASTOLIC DYSFUNCTION/ MILDLY DILATED LEFT ATRIUM  . Hardware removal  10/11/2011    Procedure: HARDWARE REMOVAL;  Surgeon: Johnn Hai, MD;  Location: [facility];  Service: Orthopedics;  Laterality: Left;  LEFT THIGH REMOVAL OF DISTAL LOCKING SCREW NEEDS: FLORO, RADIO LUSCENT TABLE AND SCREWDRIVER FOR BIOMET ROD   . Colonoscopy    . Esophagogastroduodenoscopy N/A 03/11/2013    Procedure: ESOPHAGOGASTRODUODENOSCOPY (EGD);  Surgeon: Lear Ng, MD;  Location: [facility];  Service: Endoscopy;  Laterality: N/A;   Family History  Problem Relation Age of Onset  . Emphysema Father   . Ovarian cancer Mother   . Cancer Mother      cervical  . Breast cancer Maternal Grandmother   . Cancer Sister     cervical   History  Substance Use Topics  . Smoking status: Former Smoker -- 1.00 packs/day for 30 years    Types: Cigarettes    Quit date: 03/01/1978  . Smokeless tobacco: Never Used  . Alcohol Use: 4.2 oz/week    7 Glasses of wine per week   OB History   Grav Para Term Preterm Abortions TAB SAB Ect Mult Living                 Review of Systems  Neurological: Positive for weakness.  All other systems reviewed and are negative.      Allergies  Penicillins; Adhesive; Doxycycline; and Morphine and related  Home Medications   Current Outpatient Rx  Name  Route  Sig  Dispense  Refill  . Budesonide (UCERIS) 9 MG TB24   Oral   Take 9 mg by mouth every morning.         Marland Kitchen dexlansoprazole (DEXILANT) 60 MG capsule   Oral   Take 60 mg by mouth 2 (two) times daily.          . DULoxetine (CYMBALTA) 60 MG capsule   Oral   Take 60 mg by mouth daily after lunch.          . ergocalciferol (VITAMIN D2) 50000 UNITS capsule   Oral   Take 50,000 Units by mouth once a week. tuesday         . ezetimibe-simvastatin (VYTORIN) 10-80 MG per tablet   Oral   Take 1 tablet by mouth at bedtime.          . gabapentin (NEURONTIN) 100 MG capsule   Oral   Take 300 mg by mouth 3 (three) times daily.          Marland Kitchen HYDROmorphone (DILAUDID) 2 MG tablet   Oral   Take 1 mg by mouth daily.          . hyoscyamine (LEVBID) 0.375 MG 12 hr tablet   Oral   Take 1 tablet by mouth every 12 (twelve) hours.         . levETIRAcetam (KEPPRA) 500 MG tablet   Oral   Take 1 tablet (500 mg total) by mouth 2 (two) times daily.   180 tablet   3   . levothyroxine (SYNTHROID, LEVOTHROID) 88 MCG tablet   Oral   Take 88 mcg by mouth daily before breakfast.          . MICRONIZED COLESTIPOL HCL 1 G tablet   Oral   Take 1 tablet by mouth 2 (two) times daily.          Marland Kitchen  LORazepam (ATIVAN) 0.5 MG tablet   Oral    Take 1 tablet (0.5 mg total) by mouth every 8 (eight) hours as needed. Anxiety   30 tablet   0     Patient to contact PCP for further refills.    BP 118/51  Pulse 87  Temp(Src) 100.3 F (37.9 C) (Rectal)  Resp 17  SpO2 96% Physical Exam  Nursing note and vitals reviewed. Constitutional: She appears well-developed and well-nourished.  HENT:  Head: Normocephalic and atraumatic.  Mouth/Throat: No oropharyngeal exudate.  Dry mucous membranes  Eyes: Conjunctivae and EOM are normal. Pupils are equal, round, and reactive to light. Right eye exhibits no discharge. Left eye exhibits no discharge. No scleral icterus.  Neck: Normal range of motion. Neck supple. No JVD present. No thyromegaly present.  Cardiovascular: Regular rhythm, normal heart sounds and intact distal pulses.  Exam reveals no gallop and no friction rub.   No murmur heard. Tachycardic to 105 on my exam, this rises to 115 with sitting  Pulmonary/Chest: Effort normal. No respiratory distress. She has no wheezes. She has rales (rales at the right base, soft).  No accessory muscle use, no increased work of breathing, oxygen of 85% on room air, 95% on 2 L  Abdominal: Soft. Bowel sounds are normal. She exhibits no distension and no mass. There is tenderness (diffuse abdominal tenderness, chronic).  Musculoskeletal: Normal range of motion. She exhibits edema ( 2+ pitting edema left lower extremity, no edema of the right lower extremity). She exhibits no tenderness.  Lymphadenopathy:    She has no cervical adenopathy.  Neurological: She is alert. Coordination normal.  Answers questions appropriately, diffuse generalized weakness, cannot sit up by herself. No focality to her symptoms, follows commands without difficulty, cranial nerves III through XII are normal  Skin: Skin is warm and dry. No rash noted. No erythema.  Psychiatric: She has a normal mood and affect. Her behavior is normal.    ED Course  Procedures (including  critical care time) Labs Review Labs Reviewed  CBC - Abnormal; Notable for the following:    WBC 13.7 (*)    RBC 3.75 (*)    Hemoglobin 10.5 (*)    HCT 32.3 (*)    All other components within normal limits  URINALYSIS, ROUTINE W REFLEX MICROSCOPIC - Abnormal; Notable for the following:    APPearance CLOUDY (*)    Hgb urine dipstick TRACE (*)    Protein, ur 30 (*)    Leukocytes, UA LARGE (*)    All other components within normal limits  URINE MICROSCOPIC-ADD ON - Abnormal; Notable for the following:    Bacteria, UA MANY (*)    Casts GRANULAR CAST (*)    All other components within normal limits  POCT I-STAT, CHEM 8 - Abnormal; Notable for the following:    Sodium 129 (*)    Potassium 3.2 (*)    Chloride 92 (*)    Creatinine, Ser 1.20 (*)    Glucose, Bld 100 (*)    Calcium, Ion 1.02 (*)    Hemoglobin 11.6 (*)    HCT 34.0 (*)    All other components within normal limits  URINE CULTURE  MAGNESIUM  PHOSPHORUS  CG4 I-STAT (LACTIC ACID)   Imaging Review Dg Chest Port 1 View  04/10/2013   CLINICAL DATA:  Hypoxia.  EXAM: PORTABLE CHEST - 1 VIEW  COMPARISON:  03/10/2013.  FINDINGS: Chronic elevation of the right hemidiaphragm. Right basilar atelectasis. Cardiopericardial silhouette appears within normal limits. Surgical  clips project over the left chest. There is diffusely increased interstitial opacity, favored to represent interstitial pulmonary edema. Atypical infection such as mycoplasma could also produce this appearance.  Compared to the most recent prior, of the right lateral airspace disease appears resolved.  IMPRESSION: Diffuse interstitial opacity, suggesting interstitial pulmonary edema.   Electronically Signed   By: Dereck Ligas M.D.   On: 04/10/2013 00:40    EKG Interpretation   None       MDM   Final diagnoses:  Generalized weakness  UTI (lower urinary tract infection)    The patient has multiple abnormalities on physical exam, her leukocytosis is  approximately 13,700, hyponatremic to 129, slight hypokalemia, slight renal insufficiency, hemoglobin of 11.6. She was recently admitted for a GI bleed, has no complaints of that at this time.  The patient also has a urinalysis which will be necessary, chest x-ray to rule out pneumonia, IV fluids for apparent dehydration and I anticipate admission to the hospital.  Discussed with internal medicine physician on call for [place], will admit  Meds given in ED:  Medications  0.9 %  sodium chloride infusion (not administered)  levofloxacin (LEVAQUIN) IVPB 750 mg (not administered)  sodium chloride 0.9 % bolus 1,000 mL (1,000 mLs Intravenous New Bag/Given 04/09/13 2336)      Johnna Acosta, MD 04/10/13 [phone]

## 2013-04-09 NOTE — ED Notes (Addendum)
Initial Contact - pt A+ox4, denies confusion but unable to answer some questions.  Reports weakness "since November, but they don't know what's wrong".  Pt also reports nausea, but denies at this time after med per EMS.  Pt with 2+ LLE edema, skin shiny.  Pt denies cp/sob, ST on monitor, speaking full/clear sentences, rr even/un-lab, lsctab.  +bsx4 quads.  abd s/nt/nd.  Pt denies vomiting, diarrhea, constipation.  Pt denies dysuria.  MAEI, +csm/+pulses.  NAD.  Awaiting EDP eval.

## 2013-04-09 NOTE — ED Notes (Signed)
Bed: WA01 Expected date:  Expected time:  Means of arrival:  Comments: EMS/cellulitis lower extremity-N/V

## 2013-04-10 ENCOUNTER — Encounter (HOSPITAL_COMMUNITY): Payer: Self-pay | Admitting: *Deleted

## 2013-04-10 ENCOUNTER — Emergency Department (HOSPITAL_COMMUNITY): Payer: Medicare Other

## 2013-04-10 ENCOUNTER — Inpatient Hospital Stay (HOSPITAL_COMMUNITY): Payer: Medicare Other

## 2013-04-10 DIAGNOSIS — R112 Nausea with vomiting, unspecified: Secondary | ICD-10-CM | POA: Diagnosis present

## 2013-04-10 DIAGNOSIS — M7989 Other specified soft tissue disorders: Secondary | ICD-10-CM

## 2013-04-10 DIAGNOSIS — I379 Nonrheumatic pulmonary valve disorder, unspecified: Secondary | ICD-10-CM

## 2013-04-10 DIAGNOSIS — N390 Urinary tract infection, site not specified: Secondary | ICD-10-CM | POA: Diagnosis present

## 2013-04-10 DIAGNOSIS — E876 Hypokalemia: Secondary | ICD-10-CM | POA: Diagnosis present

## 2013-04-10 DIAGNOSIS — R600 Localized edema: Secondary | ICD-10-CM | POA: Diagnosis present

## 2013-04-10 DIAGNOSIS — L0390 Cellulitis, unspecified: Secondary | ICD-10-CM | POA: Diagnosis present

## 2013-04-10 DIAGNOSIS — E43 Unspecified severe protein-calorie malnutrition: Secondary | ICD-10-CM | POA: Insufficient documentation

## 2013-04-10 DIAGNOSIS — R627 Adult failure to thrive: Secondary | ICD-10-CM | POA: Diagnosis present

## 2013-04-10 DIAGNOSIS — E871 Hypo-osmolality and hyponatremia: Secondary | ICD-10-CM | POA: Diagnosis present

## 2013-04-10 LAB — CBC
HCT: 34.3 % — ABNORMAL LOW (ref 36.0–46.0)
HEMOGLOBIN: 11 g/dL — AB (ref 12.0–15.0)
MCH: 27.8 pg (ref 26.0–34.0)
MCHC: 32.1 g/dL (ref 30.0–36.0)
MCV: 86.8 fL (ref 78.0–100.0)
Platelets: 214 10*3/uL (ref 150–400)
RBC: 3.95 MIL/uL (ref 3.87–5.11)
RDW: 14.8 % (ref 11.5–15.5)
WBC: 10.3 10*3/uL (ref 4.0–10.5)

## 2013-04-10 LAB — URINALYSIS, ROUTINE W REFLEX MICROSCOPIC
BILIRUBIN URINE: NEGATIVE
GLUCOSE, UA: NEGATIVE mg/dL
KETONES UR: NEGATIVE mg/dL
Nitrite: NEGATIVE
PROTEIN: 30 mg/dL — AB
Specific Gravity, Urine: 1.012 (ref 1.005–1.030)
Urobilinogen, UA: 0.2 mg/dL (ref 0.0–1.0)
pH: 6 (ref 5.0–8.0)

## 2013-04-10 LAB — COMPREHENSIVE METABOLIC PANEL
ALK PHOS: 80 U/L (ref 39–117)
ALT: 7 U/L (ref 0–35)
AST: 16 U/L (ref 0–37)
Albumin: 1.9 g/dL — ABNORMAL LOW (ref 3.5–5.2)
BUN: 13 mg/dL (ref 6–23)
CO2: 23 mEq/L (ref 19–32)
CREATININE: 1 mg/dL (ref 0.50–1.10)
Calcium: 8 mg/dL — ABNORMAL LOW (ref 8.4–10.5)
Chloride: 93 mEq/L — ABNORMAL LOW (ref 96–112)
GFR, EST AFRICAN AMERICAN: 62 mL/min — AB (ref >90)
GFR, EST NON AFRICAN AMERICAN: 53 mL/min — AB (ref >90)
GLUCOSE: 96 mg/dL (ref 70–99)
POTASSIUM: 3.2 meq/L — AB (ref 3.7–5.3)
Sodium: 130 mEq/L — ABNORMAL LOW (ref 137–147)
Total Bilirubin: 0.3 mg/dL (ref 0.3–1.2)
Total Protein: 5.9 g/dL — ABNORMAL LOW (ref 6.0–8.3)

## 2013-04-10 LAB — URINE MICROSCOPIC-ADD ON

## 2013-04-10 LAB — PRO B NATRIURETIC PEPTIDE: Pro B Natriuretic peptide (BNP): 961.8 pg/mL — ABNORMAL HIGH (ref 0–450)

## 2013-04-10 LAB — PHOSPHORUS: Phosphorus: 3.1 mg/dL (ref 2.3–4.6)

## 2013-04-10 LAB — PROTIME-INR
INR: 1.28 (ref 0.00–1.49)
Prothrombin Time: 15.7 seconds — ABNORMAL HIGH (ref 11.6–15.2)

## 2013-04-10 LAB — CG4 I-STAT (LACTIC ACID): Lactic Acid, Venous: 0.9 mmol/L (ref 0.5–2.2)

## 2013-04-10 LAB — MAGNESIUM: Magnesium: 1.5 mg/dL (ref 1.5–2.5)

## 2013-04-10 LAB — TSH: TSH: 1.787 u[IU]/mL (ref 0.350–4.500)

## 2013-04-10 LAB — APTT: aPTT: 34 seconds (ref 24–37)

## 2013-04-10 MED ORDER — COLESTIPOL HCL 1 G PO TABS
1.0000 g | ORAL_TABLET | Freq: Two times a day (BID) | ORAL | Status: DC
  Administered 2013-04-10 – 2013-04-17 (×15): 1 g via ORAL
  Filled 2013-04-10 (×16): qty 1

## 2013-04-10 MED ORDER — POTASSIUM CHLORIDE CRYS ER 20 MEQ PO TBCR
40.0000 meq | EXTENDED_RELEASE_TABLET | Freq: Once | ORAL | Status: AC
  Administered 2013-04-10: 40 meq via ORAL
  Filled 2013-04-10: qty 2

## 2013-04-10 MED ORDER — ONDANSETRON HCL 4 MG PO TABS: 4.0000 mg | ORAL_TABLET | Freq: Four times a day (QID) | ORAL | Status: DC | PRN

## 2013-04-10 MED ORDER — ENOXAPARIN SODIUM 40 MG/0.4ML ~~LOC~~ SOLN
40.0000 mg | SUBCUTANEOUS | Status: DC
Start: 2013-04-10 — End: 2013-04-10
  Administered 2013-04-10: 40 mg via SUBCUTANEOUS
  Filled 2013-04-10: qty 0.4

## 2013-04-10 MED ORDER — LORAZEPAM 0.5 MG PO TABS
0.5000 mg | ORAL_TABLET | Freq: Four times a day (QID) | ORAL | Status: DC | PRN
  Administered 2013-04-10 – 2013-04-11 (×2): 0.5 mg via ORAL
  Filled 2013-04-10 (×2): qty 1

## 2013-04-10 MED ORDER — ZOLPIDEM TARTRATE 5 MG PO TABS: 5.0000 mg | ORAL_TABLET | Freq: Every evening | ORAL | Status: DC | PRN

## 2013-04-10 MED ORDER — ACETAMINOPHEN 325 MG PO TABS: 650.0000 mg | ORAL_TABLET | Freq: Four times a day (QID) | ORAL | Status: DC | PRN

## 2013-04-10 MED ORDER — PANTOPRAZOLE SODIUM 40 MG PO TBEC
40.0000 mg | DELAYED_RELEASE_TABLET | Freq: Every day | ORAL | Status: DC
  Administered 2013-04-10 – 2013-04-17 (×8): 40 mg via ORAL
  Filled 2013-04-10 (×9): qty 1

## 2013-04-10 MED ORDER — DULOXETINE HCL 60 MG PO CPEP
60.0000 mg | ORAL_CAPSULE | Freq: Every day | ORAL | Status: DC
  Administered 2013-04-10 – 2013-04-17 (×8): 60 mg via ORAL
  Filled 2013-04-10 (×8): qty 1

## 2013-04-10 MED ORDER — HEPARIN (PORCINE) IN NACL 100-0.45 UNIT/ML-% IJ SOLN
1200.0000 [IU]/h | INTRAMUSCULAR | Status: DC
  Administered 2013-04-10: 1200 [IU]/h via INTRAVENOUS
  Filled 2013-04-10 (×3): qty 250

## 2013-04-10 MED ORDER — METHYLPREDNISOLONE SODIUM SUCC 125 MG IJ SOLR
60.0000 mg | Freq: Two times a day (BID) | INTRAMUSCULAR | Status: DC
  Administered 2013-04-10 – 2013-04-12 (×5): 60 mg via INTRAVENOUS
  Filled 2013-04-10 (×6): qty 0.96

## 2013-04-10 MED ORDER — POTASSIUM CHLORIDE 10 MEQ/100ML IV SOLN
10.0000 meq | INTRAVENOUS | Status: AC
  Administered 2013-04-10 (×4): 10 meq via INTRAVENOUS
  Filled 2013-04-10 (×4): qty 100

## 2013-04-10 MED ORDER — GABAPENTIN 300 MG PO CAPS
300.0000 mg | ORAL_CAPSULE | Freq: Three times a day (TID) | ORAL | Status: DC
  Administered 2013-04-10 – 2013-04-17 (×22): 300 mg via ORAL
  Filled 2013-04-10 (×24): qty 1

## 2013-04-10 MED ORDER — EZETIMIBE-SIMVASTATIN 10-80 MG PO TABS
1.0000 | ORAL_TABLET | Freq: Every day | ORAL | Status: DC
  Administered 2013-04-10 – 2013-04-16 (×7): 1 via ORAL
  Filled 2013-04-10 (×8): qty 1

## 2013-04-10 MED ORDER — LEVOTHYROXINE SODIUM 88 MCG PO TABS
88.0000 ug | ORAL_TABLET | Freq: Every day | ORAL | Status: DC
  Administered 2013-04-10 – 2013-04-17 (×8): 88 ug via ORAL
  Filled 2013-04-10 (×10): qty 1

## 2013-04-10 MED ORDER — ENSURE COMPLETE PO LIQD
237.0000 mL | Freq: Two times a day (BID) | ORAL | Status: DC
  Administered 2013-04-11 – 2013-04-16 (×9): 237 mL via ORAL

## 2013-04-10 MED ORDER — POLYETHYLENE GLYCOL 3350 17 G PO PACK
17.0000 g | PACK | Freq: Every day | ORAL | Status: DC | PRN
  Filled 2013-04-10: qty 1

## 2013-04-10 MED ORDER — LEVOFLOXACIN IN D5W 750 MG/150ML IV SOLN
750.0000 mg | Freq: Every day | INTRAVENOUS | Status: DC
  Administered 2013-04-10 – 2013-04-11 (×2): 750 mg via INTRAVENOUS
  Filled 2013-04-10 (×3): qty 150

## 2013-04-10 MED ORDER — ADULT MULTIVITAMIN W/MINERALS CH
1.0000 | ORAL_TABLET | Freq: Every day | ORAL | Status: DC
  Administered 2013-04-10 – 2013-04-17 (×8): 1 via ORAL
  Filled 2013-04-10 (×8): qty 1

## 2013-04-10 MED ORDER — HYDROMORPHONE HCL PF 1 MG/ML IJ SOLN
0.5000 mg | INTRAMUSCULAR | Status: DC | PRN
  Administered 2013-04-10 – 2013-04-16 (×9): 0.5 mg via INTRAVENOUS
  Filled 2013-04-10 (×10): qty 1

## 2013-04-10 MED ORDER — ONDANSETRON HCL 4 MG/2ML IJ SOLN: 4.0000 mg | Freq: Four times a day (QID) | INTRAMUSCULAR | Status: DC | PRN

## 2013-04-10 MED ORDER — DOCUSATE SODIUM 100 MG PO CAPS
100.0000 mg | ORAL_CAPSULE | Freq: Two times a day (BID) | ORAL | Status: DC
  Administered 2013-04-10 – 2013-04-17 (×15): 100 mg via ORAL
  Filled 2013-04-10 (×16): qty 1

## 2013-04-10 MED ORDER — ALUM & MAG HYDROXIDE-SIMETH 200-200-20 MG/5ML PO SUSP: 30.0000 mL | Freq: Four times a day (QID) | ORAL | Status: DC | PRN

## 2013-04-10 MED ORDER — ACETAMINOPHEN 650 MG RE SUPP: 650.0000 mg | Freq: Four times a day (QID) | RECTAL | Status: DC | PRN

## 2013-04-10 MED ORDER — LEVOFLOXACIN IN D5W 750 MG/150ML IV SOLN
750.0000 mg | Freq: Once | INTRAVENOUS | Status: AC
  Administered 2013-04-10: 750 mg via INTRAVENOUS
  Filled 2013-04-10: qty 150

## 2013-04-10 MED ORDER — LEVETIRACETAM 500 MG PO TABS
500.0000 mg | ORAL_TABLET | Freq: Two times a day (BID) | ORAL | Status: DC
  Administered 2013-04-10 – 2013-04-17 (×15): 500 mg via ORAL
  Filled 2013-04-10 (×16): qty 1

## 2013-04-10 MED ORDER — HYOSCYAMINE SULFATE ER 0.375 MG PO TB12
0.3750 mg | ORAL_TABLET | Freq: Two times a day (BID) | ORAL | Status: DC
  Administered 2013-04-10 – 2013-04-17 (×15): 0.375 mg via ORAL
  Filled 2013-04-10 (×16): qty 1

## 2013-04-10 NOTE — Progress Notes (Signed)
Patient ID: [name], female   DOB: 1934/06/20, 79 y.o.   MRN: [id number] Acute DVT noted on report (not called!!!) Will Rx heparin IV Now CXR suggests an upper lobe pneumonia, on [place]

## 2013-04-10 NOTE — Progress Notes (Signed)
PT Cancellation Note  Patient Details Name: MUSETTE KISAMORE MRN: [id number] DOB: 1935-01-31   Cancelled Treatment:    Reason Eval/Treat Not Completed: Medical issues which prohibited therapy (newly diagnosed DVT, to start heparin)   Claretha Cooper 04/10/2013, 12:57 PM Tresa Endo PT [phone]

## 2013-04-10 NOTE — Progress Notes (Addendum)
VASCULAR LAB PRELIMINARY  PRELIMINARY  PRELIMINARY  PRELIMINARY  Left lower extremity venous duplex completed.    Preliminary report:  Left: Acute DVT noted coursing from the posterior tibial through the popliteal and femoral veins to th confluence but not including the common femoral vein. Also noted is a DVT of the proximal gastrocnemius vein.  No evidence of superficial thrombosis.  No Baker's cyst. There is no propagation to the right at this time  Katryna Tschirhart, [place], RVS 04/10/2013, 11:24 AM

## 2013-04-10 NOTE — Progress Notes (Signed)
Nurse called report to charge nurse on [place] prior to transferring patient.

## 2013-04-10 NOTE — Consult Note (Signed)
WOC wound consult note Reason for Consult:LE edema in the presence of DVT.  Recently discharged by [facility]ound [facility] at [facility] post healing of Venous Stasis ulcerations. No wounds at present. Wound type:Venous insufficiency Pressure Ulcer POA: No Measurement:None Wound DDU:KGUR Drainage (amount, consistency, odor) Nonee Periwound:None Dressing procedure/placement/frequency:I will recommend only LE elevation at this time given the presence of newly diagnosed DVT.  Compression is contraindicated at this time.  Note:  Patient was ordered a piece of DME by the outpatient wound care center-a compression device that I believe is Circ-Aid, by their description.  They did not receive instruction on this equipment.  I believe patient and spouse wound benefit from an additional visit at the wound care center for instruction in this DME post discharge and post resolution of the DVT.  If you agree, please order or arrange for 1 subsequent visit at that facility. [place] nursing team will not follow, but will remain available to this patient, the nursing and medical team.  Please re-consult if needed. Thanks, Maudie Flakes, MSN, RN, [place], [place], [place] [phone])

## 2013-04-10 NOTE — H&P (Signed)
PCP:   Sheela Stack, MD   Chief Complaint:  weakness  HPI: 79 YO WF with recent GI bleed, adrenal insufficiency, adult FTT. Was in under my care in [place]. Not seen since then. Pt reports episodes of N/V and some non bloody diarrhea. Has gotten weaker and was brought the ER. Unable to ambulate in the ER. Has pain "all over". Some nausea this AM. Back pain is at baseline. Has been taking prednisone. Notes LLE edema since "the first of the year". Breathing is fair with little cough. Denies dysuria but has TNTC WBC in her urine specimen. No chills or sweats. No cardiac CP.  Our office records do not reflect any interactions since her [place] discharge.   Review of Systems:  Review of Systems - as above Past Medical History: Past Medical History  Diagnosis Date  . IBS (irritable bowel syndrome)   . Fibromyalgia   . History of DVT (deep vein thrombosis)   . History of pulmonary embolism   . Osteoporosis   . History of breast cancer LEFT BREAST DCIS  1996  . Hyperlipidemia   . 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 Breast cancer RIGHT SIDE-- DX OCT 2011    CHEMORADIATION THERAPY-- COMPLETED   . PONV (postoperative nausea and vomiting)   . Skin tear LEFT ARM COVERED W/ TEGADERM    PT STATES HX MULTIPLE SKIN TEARS -- THIN  . Thin skin SECONARDY AGE AND HX CHEMO  . Ecchymosis   . Seasonal allergies   . Frequency of urination   . Nocturia   . Anemia   . Lung mass PER DR CLANCE NOTE UNCLEAR IF LYMPHOMA FROM BX    FOLLOWED BY DR RUBIN  . Seizures   . Unspecified hereditary and idiopathic peripheral neuropathy   . Asthma   . Fibromyalgia   . Vertebral compression fracture 5/14    L4  GI bleed, acute gastritis. Pyloric ulcer, duodenal AVM  Active Problems:  Acute blood loss anemia. stable  Adrenal insufficiency  Unsteady gait  Renal insufficiency  Hypertension  E coli UTI  Protein calorie malnutrition  Hx Breast CA  Hyperlipidemia   Depression  Seizure disorder  Osteoporosis with compression fx and back pain  Past Surgical History  Procedure Laterality Date  . Total abdominal hysterectomy w/ bilateral salpingoophorectomy  1977  (APPROX)  . Cholecystectomy    . Appendectomy    . Femur fracture surgery  12-18-2010  DR BEANE    INTRAMEDULLARY NAILING LEFT INTERTROCHANTERIC -SUBTROCHANTERIC FX  . Right breast lumpectomy / removal lymph nodes x2/ removal pac  10-01-2010    CARCINOMA RIGHT BREAST  . Placement port- a- cath  02-18-2010  . Bronchoscopy  01-29-2010    W/ BX  . Tvt vaginal tape  suburethral sling   06-08-2005    SUI  . Bilateral laminectomy/ foraminotomy  01-14-2004    L4 - L5  . Breast lumpectomy  1996     LEFT BREAST DCIS   . Cataract extraction w/ intraocular lens  implant, bilateral    . Bilateral carpal tunnel release    . Tonsillectomy    . Transthoracic echocardiogram  02-25-2010    LVSF NORMAL/ EF 13-08%/ GRADE I DIASTOLIC DYSFUNCTION/ MILDLY DILATED LEFT ATRIUM  . Hardware removal  10/11/2011    Procedure: HARDWARE REMOVAL;  Surgeon: Johnn Hai, MD;  Location: [facility];  Service: Orthopedics;  Laterality: Left;  LEFT THIGH REMOVAL OF DISTAL LOCKING SCREW NEEDS:  FLORO, RADIO LUSCENT TABLE AND SCREWDRIVER FOR BIOMET ROD   . Colonoscopy    . Esophagogastroduodenoscopy N/A 03/11/2013    Procedure: ESOPHAGOGASTRODUODENOSCOPY (EGD);  Surgeon: Lear Ng, MD;  Location: [facility];  Service: Endoscopy;  Laterality: N/A;    Medications: Prior to Admission medications   Medication Sig Start Date End Date Taking? Authorizing Provider  Budesonide (UCERIS) 9 MG TB24 Take 9 mg by mouth every morning.   Yes Historical Provider, MD  dexlansoprazole (DEXILANT) 60 MG capsule Take 60 mg by mouth 2 (two) times daily.    Yes Historical Provider, MD  DULoxetine (CYMBALTA) 60 MG capsule Take 60 mg by mouth daily after lunch.    Yes Historical Provider, MD  ergocalciferol  (VITAMIN D2) 50000 UNITS capsule Take 50,000 Units by mouth once a week. tuesday   Yes Historical Provider, MD  ezetimibe-simvastatin (VYTORIN) 10-80 MG per tablet Take 1 tablet by mouth at bedtime.    Yes Historical Provider, MD  gabapentin (NEURONTIN) 100 MG capsule Take 300 mg by mouth 3 (three) times daily.    Yes Historical Provider, MD  HYDROmorphone (DILAUDID) 2 MG tablet Take 1 mg by mouth daily.  11/13/12  Yes Historical Provider, MD  hyoscyamine (LEVBID) 0.375 MG 12 hr tablet Take 1 tablet by mouth every 12 (twelve) hours. 07/10/12  Yes Historical Provider, MD  levETIRAcetam (KEPPRA) 500 MG tablet Take 1 tablet (500 mg total) by mouth 2 (two) times daily. 06/14/12  Yes Marcial Pacas, MD  levothyroxine (SYNTHROID, LEVOTHROID) 88 MCG tablet Take 88 mcg by mouth daily before breakfast.    Yes Historical Provider, MD  MICRONIZED COLESTIPOL HCL 1 G tablet Take 1 tablet by mouth 2 (two) times daily.  07/10/12  Yes Historical Provider, MD  LORazepam (ATIVAN) 0.5 MG tablet Take 1 tablet (0.5 mg total) by mouth every 8 (eight) hours as needed. Anxiety 07/04/12   Vivien Rota, NP    Allergies:   Allergies  Allergen Reactions  . Penicillins Anaphylaxis  . Adhesive [Tape] Other (See Comments)    Blisters   . Doxycycline Nausea And Vomiting  . Morphine And Related Other (See Comments)    HALLUCINATIONS    Social History:  reports that she quit smoking about 35 years ago. Her smoking use included Cigarettes. She has a 30 pack-year smoking history. She has never used smokeless tobacco. She reports that she drinks about 4.2 ounces of alcohol per week. She reports that she does not use illicit drugs.  Family History: Family History  Problem Relation Age of Onset  . Emphysema Father   . Ovarian cancer Mother   . Cancer Mother     cervical  . Breast cancer Maternal Grandmother   . Cancer Sister     cervical    Physical Exam: Filed Vitals:   04/10/13 0323 04/10/13 0500 04/10/13 0625  04/10/13 0628  BP: 98/52   112/68  Pulse: 88  97   Temp:   99 F (37.2 C)   TempSrc:   Oral   Resp: 18  16   Height: 5' 6.5" (1.689 m)     Weight:  74.39 kg (164 lb)    SpO2: 100%  93%    General appearance: frail, lying flat in no distress. O2 in place. Sclera anicteric, face symmetric. Oral membranes moist  Neck: no adenopathy, no carotid bruit, no JVD and thyroid not enlarged, symmetric, no tenderness/mass/nodules Resp: fairly clear with no wheeze, rales or rhonchi, no accessory ms in use Cardio: regular  sl fast with sys murmur GI: obese soft, non-tender; bowel sounds normal; no masses,  no organomegaly Extremities:  3+ left and 2+ right edema. Pulses OK Lymph nodes: Cervical adenopathy: no cervical lymphadenopathy Neurologic: awake, globally weak, recognizes me. Speech clear and fluent. No tremor at rest. Gait not tested.  Skin thin with brusing. Some redness from venous stasis on legs  Labs on Admission:   Recent Labs  04/09/13 2220 04/09/13 2228 04/10/13 0540  NA  --  129* 130*  K  --  3.2* 3.2*  CL  --  92* 93*  CO2  --   --  23  GLUCOSE  --  100* 96  BUN  --  12 13  CREATININE  --  1.20* 1.00  CALCIUM  --   --  8.0*  MG 1.5  --   --   PHOS 3.1  --   --     Recent Labs  04/10/13 0540  AST 16  ALT 7  ALKPHOS 80  BILITOT 0.3  PROT 5.9*  ALBUMIN 1.9*     Recent Labs  04/09/13 2220 04/09/13 2228 04/10/13 0540  WBC 13.7*  --  10.3  HGB 10.5* 11.6* 11.0*  HCT 32.3* 34.0* 34.3*  MCV 86.1  --  86.8  PLT 225  --  214      Radiological Exams on Admission: Dg Chest Port 1 View  04/10/2013   CLINICAL DATA:  Hypoxia.  EXAM: PORTABLE CHEST - 1 VIEW  COMPARISON:  03/10/2013.  FINDINGS: Chronic elevation of the right hemidiaphragm. Right basilar atelectasis. Cardiopericardial silhouette appears within normal limits. Surgical clips project over the left chest. There is diffusely increased interstitial opacity, favored to represent interstitial pulmonary  edema. Atypical infection such as mycoplasma could also produce this appearance.  Compared to the most recent prior, of the right lateral airspace disease appears resolved.  IMPRESSION: Diffuse interstitial opacity, suggesting interstitial pulmonary edema.   Electronically Signed   By: Dereck Ligas M.D.   On: 04/10/2013 00:40   Orders placed during the hospital encounter of 04/09/13  . ED EKG: Sinus rhythm Atrial premature complex Borderline left axis deviation Low voltage, extremity and precordial leads Baseline wander in lead(s) V4  .    .   .     Assessment/Plan Principal Problem:   UTI (urinary tract infection): urine clearly looks infected but pt has no sxs. On levaquin now  Active Problems:   Adrenal insufficiency: will increase dose for stress ABNL CXR: read as pulm edema?. Exam and hx doesn't support that well. Check BNP and check ECHO.EKG non ischemia. EF 55-60% in 2011 HYPOTHYROID: check TSH   Irritable bowel syndrome: has been having constipation and diarrhea RECENT GI BLEED: Hgb looks pretty good AKI/VOLUME DEPLETION: A bit better with fluids   Hyponatremia: sl better but persistant. Baseline for this pt is about 130   Hypokalemia: replace   Leg edema, left: check [place] to R/O DVT   Nausea with vomiting: ? Partial due to adrenal issues. May need CT   Adult failure to thrive/Protein calorie malnutrition: Albumin only 1.9   Geroge Gilliam [name] 04/10/2013, 9:13 AM

## 2013-04-10 NOTE — Progress Notes (Signed)
ANTICOAGULATION CONSULT NOTE - Initial Consult  Pharmacy Consult for Heparin Indication: New DVT  Allergies  Allergen Reactions  . Penicillins Anaphylaxis  . Adhesive [Tape] Other (See Comments)    Blisters   . Doxycycline Nausea And Vomiting  . Morphine And Related Other (See Comments)    HALLUCINATIONS    Patient Measurements: Height: 5' 6.5" (168.9 cm) Weight: 164 lb (74.39 kg) IBW/kg (Calculated) : 60.45 Heparin Dosing Weight: 74.3 kg  Vital Signs: Temp: 99 F (37.2 C) (02/10 0625) Temp src: Oral (02/10 0625) BP: 112/68 mmHg (02/10 0628) Pulse Rate: 97 (02/10 0625)  Labs:  Recent Labs  04/09/13 2220 04/09/13 2228 04/10/13 0540  HGB 10.5* 11.6* 11.0*  HCT 32.3* 34.0* 34.3*  PLT 225  --  214  CREATININE  --  1.20* 1.00    Estimated Creatinine Clearance: 49.9 ml/min (by C-G formula based on Cr of 1).   Medical History: Past Medical History  Diagnosis Date  . IBS (irritable bowel syndrome)   . Fibromyalgia   . History of DVT (deep vein thrombosis)   . History of pulmonary embolism   . Osteoporosis   . History of breast cancer LEFT BREAST DCIS  1996  . Hyperlipidemia   . 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 Breast cancer RIGHT SIDE-- DX OCT 2011    CHEMORADIATION THERAPY-- COMPLETED   . PONV (postoperative nausea and vomiting)   . Skin tear LEFT ARM COVERED W/ TEGADERM    PT STATES HX MULTIPLE SKIN TEARS -- THIN  . Thin skin SECONARDY AGE AND HX CHEMO  . Ecchymosis   . Seasonal allergies   . Frequency of urination   . Nocturia   . Anemia   . Lung mass PER DR CLANCE NOTE UNCLEAR IF LYMPHOMA FROM BX    FOLLOWED BY DR RUBIN  . Seizures   . Unspecified hereditary and idiopathic peripheral neuropathy   . Asthma   . Fibromyalgia   . Vertebral compression fracture 5/14    L4    Medications:  Scheduled:  . colestipol  1 g Oral BID  . docusate sodium  100 mg Oral BID  . DULoxetine  60 mg Oral QPC lunch  .  ezetimibe-simvastatin  1 tablet Oral QHS  . feeding supplement (ENSURE COMPLETE)  237 mL Oral BID BM  . gabapentin  300 mg Oral TID  . hyoscyamine  0.375 mg Oral Q12H  . levETIRAcetam  500 mg Oral BID  . levothyroxine  88 mcg Oral QAC breakfast  . methylPREDNISolone (SOLU-MEDROL) injection  60 mg Intravenous Q12H  . multivitamin with minerals  1 tablet Oral Daily  . pantoprazole  40 mg Oral Daily  . potassium chloride  10 mEq Intravenous Q1 Hr x 4   Infusions:    Assessment: 79 YO WF with recent GI bleed, adrenal insufficiency, adult FTT. Admitted with N/V/D and pain. LE doppler checked for swelling - + for DVT. To begin IV heparin per Pharmacy.  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 No further work-up planned. Monitor carefully on full-dose anticoag, CBC currently stable  Goal of Therapy:  Heparin level 0.3-0.7 units/ml Monitor platelets by anticoagulation protocol: Yes   Plan:   Since patient received Lovenox 40mg  at 11am today, will NOT give heparin bolus  Begin heparin infusion 1200 units/hr (~16 units/kg/hr)  Check heparin level in 8hrs  Daily heparin level, CBC  Peggyann Juba, PharmD, BCPS Pager: [phone] 04/10/2013,1:24 PM

## 2013-04-10 NOTE — Progress Notes (Signed)
  Echocardiogram 2D Echocardiogram has been performed.  Diamond Nickel 04/10/2013, 2:33 PM

## 2013-04-10 NOTE — Progress Notes (Signed)
Nurse tried calling MD to inform him of findings for venous doppler. Nurse was not able to get in touch with MD.  New orders noted by nurse to start heparin for dvt for patient. MD called unit.

## 2013-04-10 NOTE — Progress Notes (Signed)
INITIAL NUTRITION ASSESSMENT  Pt meets criteria for severe MALNUTRITION in the context of chronic illness as evidenced by <75% estimated energy intake with 6.3% weight loss in the past month.  DOCUMENTATION CODES Per approved criteria  -Severe malnutrition in the context of chronic illness   INTERVENTION: - Ensure Complete BID - Recommend GI consult r/t pt's report of daily diarrhea for more than 1 year and nausea for several weeks - Will continue to monitor  NUTRITION DIAGNOSIS: Inadequate oral intake related to ongoing nausea, diarrhea, and poor appetite as evidenced by pt report, 0% meal intake.   Goal: 1. Resolution of nausea and diarrhea 2. Pt to consume >90% of meals/supplements  Monitor:  Weights, labs, intake, nausea, diarrhea  Reason for Assessment: Consult for failure to thrive  79 y.o. female  Admitting Dx: UTI (urinary tract infection)  ASSESSMENT: Pt with with recent GI bleed, adrenal insufficiency, adult FTT. Pt reports episodes of N/V and some non bloody diarrhea. Has gotten weaker and was brought the ER. Unable to ambulate in the ER. Has pain "all over". Some nausea this AM. Back pain is at baseline. Has been taking prednisone. Notes LLE edema since "the first of the year". Hx of IBS, osteoporosis, breast CA s/p chemoradiation, lung mass, and neuropathy.   Met with pt and husband who report pt has had diarrhea 2-3 times/day for years, occurs every time she eats. Reports having nausea for weeks. Eats only 1 meal/day of varied content but typically come kind of protein, vegetable, and starch. Not on any nutritional supplements at home. Has lost 11 pounds in the past month. Unable to do nutrition focused physical exam as pt was falling asleep.   Potassium low - getting IV and oral replacement and daily multivitamin   Height: Ht Readings from Last 1 Encounters:  04/10/13 5' 6.5" (1.689 m)    Weight: Wt Readings from Last 1 Encounters:  04/10/13 164 lb (74.39 kg)     Ideal Body Weight: 130 lb  % Ideal Body Weight: 126%  Wt Readings from Last 10 Encounters:  04/10/13 164 lb (74.39 kg)  03/12/13 175 lb 6.4 oz (79.561 kg)  03/12/13 175 lb 6.4 oz (79.561 kg)  01/04/13 177 lb (80.287 kg)  12/05/12 165 lb (74.844 kg)  10/30/12 174 lb (78.926 kg)  07/25/12 167 lb (75.751 kg)  07/18/12 167 lb 6.4 oz (75.932 kg)  07/14/12 161 lb (73.029 kg)  07/04/12 168 lb (76.204 kg)    Usual Body Weight: 175 lb  % Usual Body Weight: 94%  BMI:  Body mass index is 26.08 kg/(m^2).  Estimated Nutritional Needs: Kcal: 1500-1700 Protein: 75-90g Fluid: 1.5-1.7L/day  Skin: +1 RLE edema, +2 LLE edema  Diet Order: General  EDUCATION NEEDS: -No education needs identified at this time   Intake/Output Summary (Last 24 hours) at 04/10/13 1036 Last data filed at 04/10/13 0900  Gross per 24 hour  Intake    120 ml  Output    125 ml  Net     -5 ml    Last BM: 2/9  Labs:   Recent Labs Lab 04/09/13 2220 04/09/13 2228 04/10/13 0540  NA  --  129* 130*  K  --  3.2* 3.2*  CL  --  92* 93*  CO2  --   --  23  BUN  --  12 13  CREATININE  --  1.20* 1.00  CALCIUM  --   --  8.0*  MG 1.5  --   --  PHOS 3.1  --   --   GLUCOSE  --  100* 96    CBG (last 3)  No results found for this basename: GLUCAP,  in the last 72 hours  Scheduled Meds: . colestipol  1 g Oral BID  . docusate sodium  100 mg Oral BID  . DULoxetine  60 mg Oral QPC lunch  . enoxaparin (LOVENOX) injection  40 mg Subcutaneous Q24H  . ezetimibe-simvastatin  1 tablet Oral QHS  . gabapentin  300 mg Oral TID  . hyoscyamine  0.375 mg Oral Q12H  . levETIRAcetam  500 mg Oral BID  . levothyroxine  88 mcg Oral QAC breakfast  . methylPREDNISolone (SOLU-MEDROL) injection  60 mg Intravenous Q12H  . multivitamin with minerals  1 tablet Oral Daily  . pantoprazole  40 mg Oral Daily  . potassium chloride  10 mEq Intravenous Q1 Hr x 4    Continuous Infusions:   Past Medical History  Diagnosis  Date  . IBS (irritable bowel syndrome)   . Fibromyalgia   . History of DVT (deep vein thrombosis)   . History of pulmonary embolism   . Osteoporosis   . History of breast cancer LEFT BREAST DCIS  1996  . Hyperlipidemia   . 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 Breast cancer RIGHT SIDE-- DX OCT 2011    CHEMORADIATION THERAPY-- COMPLETED   . PONV (postoperative nausea and vomiting)   . Skin tear LEFT ARM COVERED W/ TEGADERM    PT STATES HX MULTIPLE SKIN TEARS -- THIN  . Thin skin SECONARDY AGE AND HX CHEMO  . Ecchymosis   . Seasonal allergies   . Frequency of urination   . Nocturia   . Anemia   . Lung mass PER DR CLANCE NOTE UNCLEAR IF LYMPHOMA FROM BX    FOLLOWED BY DR RUBIN  . Seizures   . Unspecified hereditary and idiopathic peripheral neuropathy   . Asthma   . Fibromyalgia   . Vertebral compression fracture 5/14    L4    Past Surgical History  Procedure Laterality Date  . Total abdominal hysterectomy w/ bilateral salpingoophorectomy  1977  (APPROX)  . Cholecystectomy    . Appendectomy    . Femur fracture surgery  12-18-2010  DR BEANE    INTRAMEDULLARY NAILING LEFT INTERTROCHANTERIC -SUBTROCHANTERIC FX  . Right breast lumpectomy / removal lymph nodes x2/ removal pac  10-01-2010    CARCINOMA RIGHT BREAST  . Placement port- a- cath  02-18-2010  . Bronchoscopy  01-29-2010    W/ BX  . Tvt vaginal tape  suburethral sling   06-08-2005    SUI  . Bilateral laminectomy/ foraminotomy  01-14-2004    L4 - L5  . Breast lumpectomy  1996     LEFT BREAST DCIS   . Cataract extraction w/ intraocular lens  implant, bilateral    . Bilateral carpal tunnel release    . Tonsillectomy    . Transthoracic echocardiogram  02-25-2010    LVSF NORMAL/ EF 66-29%/ GRADE I DIASTOLIC DYSFUNCTION/ MILDLY DILATED LEFT ATRIUM  . Hardware removal  10/11/2011    Procedure: HARDWARE REMOVAL;  Surgeon: Johnn Hai, MD;  Location: [facility];   Service: Orthopedics;  Laterality: Left;  LEFT THIGH REMOVAL OF DISTAL LOCKING SCREW NEEDS: FLORO, RADIO LUSCENT TABLE AND SCREWDRIVER FOR BIOMET ROD   . Colonoscopy    . Esophagogastroduodenoscopy N/A 03/11/2013    Procedure: ESOPHAGOGASTRODUODENOSCOPY (EGD);  Surgeon: Evette Doffing  Aloha Gell, MD;  Location: [facility];  Service: Endoscopy;  Laterality: N/A;    Mikey College MS, [place], [place] Pager [phone] After Hours Pager

## 2013-04-11 LAB — COMPREHENSIVE METABOLIC PANEL
ALBUMIN: 2 g/dL — AB (ref 3.5–5.2)
ALT: 6 U/L (ref 0–35)
AST: 12 U/L (ref 0–37)
Alkaline Phosphatase: 79 U/L (ref 39–117)
BUN: 11 mg/dL (ref 6–23)
CHLORIDE: 100 meq/L (ref 96–112)
CO2: 21 meq/L (ref 19–32)
Calcium: 8.3 mg/dL — ABNORMAL LOW (ref 8.4–10.5)
Creatinine, Ser: 0.75 mg/dL (ref 0.50–1.10)
GFR calc Af Amer: >90 mL/min (ref >90)
GFR, EST NON AFRICAN AMERICAN: 80 mL/min — AB (ref >90)
Glucose, Bld: 121 mg/dL — ABNORMAL HIGH (ref 70–99)
POTASSIUM: 4.9 meq/L (ref 3.7–5.3)
Sodium: 134 mEq/L — ABNORMAL LOW (ref 137–147)
Total Protein: 6 g/dL (ref 6.0–8.3)

## 2013-04-11 LAB — CBC
HCT: 32.3 % — ABNORMAL LOW (ref 36.0–46.0)
Hemoglobin: 10.2 g/dL — ABNORMAL LOW (ref 12.0–15.0)
MCH: 27.6 pg (ref 26.0–34.0)
MCHC: 31.6 g/dL (ref 30.0–36.0)
MCV: 87.5 fL (ref 78.0–100.0)
PLATELETS: 224 10*3/uL (ref 150–400)
RBC: 3.69 MIL/uL — ABNORMAL LOW (ref 3.87–5.11)
RDW: 14.5 % (ref 11.5–15.5)
WBC: 5.8 10*3/uL (ref 4.0–10.5)

## 2013-04-11 LAB — HEPARIN LEVEL (UNFRACTIONATED)
Heparin Unfractionated: 0.46 IU/mL (ref 0.30–0.70)
Heparin Unfractionated: 0.63 IU/mL (ref 0.30–0.70)

## 2013-04-11 MED ORDER — WARFARIN VIDEO
Freq: Once | Status: AC
  Administered 2013-04-11: 12:00:00

## 2013-04-11 MED ORDER — COUMADIN BOOK
Freq: Once | Status: AC
  Administered 2013-04-11: 09:00:00
  Filled 2013-04-11: qty 1

## 2013-04-11 MED ORDER — HEPARIN (PORCINE) IN NACL 100-0.45 UNIT/ML-% IJ SOLN
1150.0000 [IU]/h | INTRAMUSCULAR | Status: DC
  Administered 2013-04-11: 1200 [IU]/h via INTRAVENOUS
  Administered 2013-04-13 – 2013-04-14 (×2): 1150 [IU]/h via INTRAVENOUS
  Filled 2013-04-11 (×3): qty 250

## 2013-04-11 MED ORDER — WARFARIN SODIUM 5 MG PO TABS
5.0000 mg | ORAL_TABLET | Freq: Once | ORAL | Status: AC
  Administered 2013-04-11: 5 mg via ORAL
  Filled 2013-04-11: qty 1

## 2013-04-11 MED ORDER — WARFARIN - PHARMACIST DOSING INPATIENT: Freq: Every day | Status: DC

## 2013-04-11 NOTE — Discharge Instructions (Signed)
Information on my medicine - Coumadin   (Warfarin)  This medication education was reviewed with me or my healthcare representative as part of my discharge preparation.  The pharmacist that spoke with me during my hospital stay was:  Emiliano Dyer, [facility]  Why was Coumadin prescribed for you? Coumadin was prescribed for you because you have a blood clot or a medical condition that can cause an increased risk of forming blood clots. Blood clots can cause serious health problems by blocking the flow of blood to the heart, lung, or brain. Coumadin can prevent harmful blood clots from forming. As a reminder your indication for Coumadin is:   Deep Vein Thrombosis Treatment  What test will check on my response to Coumadin? While on Coumadin (warfarin) you will need to have an INR test regularly to ensure that your dose is keeping you in the desired range. The INR (international normalized ratio) number is calculated from the result of the laboratory test called prothrombin time (PT).  If an INR APPOINTMENT HAS NOT ALREADY BEEN MADE FOR YOU please schedule an appointment to have this lab work done by your health care provider within 7 days. Your INR goal is usually a number between:  2 to 3 or your provider may give you a more narrow range like 2-2.5.  Ask your health care provider during an office visit what your goal INR is.  What  do you need to  know  About  COUMADIN? Take Coumadin (warfarin) exactly as prescribed by your healthcare provider about the same time each day.  DO NOT stop taking without talking to the doctor who prescribed the medication.  Stopping without other blood clot prevention medication to take the place of Coumadin may increase your risk of developing a new clot or stroke.  Get refills before you run out.  What do you do if you miss a dose? If you miss a dose, take it as soon as you remember on the same day then continue your regularly scheduled regimen the next day.  Do not  take two doses of Coumadin at the same time.  Important Safety Information A possible side effect of Coumadin (Warfarin) is an increased risk of bleeding. You should call your healthcare provider right away if you experience any of the following:   Bleeding from an injury or your nose that does not stop.   Unusual colored urine (red or dark brown) or unusual colored stools (red or black).   Unusual bruising for unknown reasons.   A serious fall or if you hit your head (even if there is no bleeding).  Some foods or medicines interact with Coumadin (warfarin) and might alter your response to warfarin. To help avoid this:   Eat a balanced diet, maintaining a consistent amount of Vitamin K.   Notify your provider about major diet changes you plan to make.   Avoid alcohol or limit your intake to 1 drink for women and 2 drinks for men per day. (1 drink is 5 oz. wine, 12 oz. beer, or 1.5 oz. liquor.)  Make sure that ANY health care provider who prescribes medication for you knows that you are taking Coumadin (warfarin).  Also make sure the healthcare provider who is monitoring your Coumadin knows when you have started a new medication including herbals and non-prescription products.  Coumadin (Warfarin)  Major Drug Interactions  Increased Warfarin Effect Decreased Warfarin Effect  Alcohol (large quantities) Antibiotics (esp. Septra/Bactrim, Flagyl, Cipro) Amiodarone (Cordarone) Aspirin (ASA) Cimetidine (  Tagamet) Megestrol (Megace) NSAIDs (ibuprofen, naproxen, etc.) Piroxicam (Feldene) Propafenone (Rythmol SR) Propranolol (Inderal) Isoniazid (INH) Posaconazole (Noxafil) Barbiturates (Phenobarbital) Carbamazepine (Tegretol) Chlordiazepoxide (Librium) Cholestyramine (Questran) Griseofulvin Oral Contraceptives Rifampin Sucralfate (Carafate) Vitamin K   Coumadin (Warfarin) Major Herbal Interactions  Increased Warfarin Effect Decreased Warfarin Effect  Garlic Ginseng Ginkgo biloba  Coenzyme Q10 Green tea St. Johns wort    Coumadin (Warfarin) FOOD Interactions  Eat a consistent number of servings per week of foods HIGH in Vitamin K (1 serving =  cup)  Collards (cooked, or boiled & drained) Kale (cooked, or boiled & drained) Mustard greens (cooked, or boiled & drained) Parsley *serving size only =  cup Spinach (cooked, or boiled & drained) Swiss chard (cooked, or boiled & drained) Turnip greens (cooked, or boiled & drained)  Eat a consistent number of servings per week of foods MEDIUM-HIGH in Vitamin K (1 serving = 1 cup)  Asparagus (cooked, or boiled & drained) Broccoli (cooked, boiled & drained, or raw & chopped) Brussel sprouts (cooked, or boiled & drained) *serving size only =  cup Lettuce, raw (green leaf, endive, romaine) Spinach, raw Turnip greens, raw & chopped   These websites have more information on Coumadin (warfarin):  FailFactory.se; VeganReport.com.au;

## 2013-04-11 NOTE — Progress Notes (Signed)
ANTICOAGULATION CONSULT NOTE - Follow Up Consult  Pharmacy Consult for Heparin Indication: DVT  Allergies  Allergen Reactions  . Penicillins Anaphylaxis  . Adhesive [Tape] Other (See Comments)    Blisters   . Doxycycline Nausea And Vomiting  . Morphine And Related Other (See Comments)    HALLUCINATIONS    Patient Measurements: Height: 5' 6.5" (168.9 cm) Weight: 164 lb (74.39 kg) IBW/kg (Calculated) : 60.45 Heparin Dosing Weight:   Vital Signs: Temp: 97.9 F (36.6 C) (02/10 2015) Temp src: Oral (02/10 2015) BP: 118/54 mmHg (02/10 2015) Pulse Rate: 70 (02/10 2015)  Labs:  Recent Labs  04/09/13 2220 04/09/13 2228 04/10/13 0540 04/10/13 1505 04/11/13 0045  HGB 10.5* 11.6* 11.0*  --  10.2*  HCT 32.3* 34.0* 34.3*  --  32.3*  PLT 225  --  214  --  224  APTT  --   --   --  34  --   LABPROT  --   --   --  15.7*  --   INR  --   --   --  1.28  --   HEPARINUNFRC  --   --   --   --  0.46  CREATININE  --  1.20* 1.00  --  0.75    Estimated Creatinine Clearance: 62.4 ml/min (by C-G formula based on Cr of 0.75).   Medications:  Infusions:  . heparin 1,200 Units/hr (04/10/13 1516)    Assessment: Patient with heparin level at goal.  For level drawn, due to LE edema/cellulitis and preservation of opposite arm. Lab drawn from less distal location on same arm as heparin infusion (in hand). RN held heparin x 5mins then drew lab No other issues noted.  Goal of Therapy:  Heparin level 0.3-0.7 units/ml Monitor platelets by anticoagulation protocol: Yes   Plan:  Continue heparin at current rate Recheck level at 0800  [name], [place] 04/11/2013,3:01 AM

## 2013-04-11 NOTE — Progress Notes (Signed)
ANTICOAGULATION CONSULT NOTE - Follow Up  Pharmacy Consult for Heparin/Warfarin Indication: New DVT  Allergies  Allergen Reactions  . Penicillins Anaphylaxis  . Adhesive [Tape] Other (See Comments)    Blisters   . Doxycycline Nausea And Vomiting  . Morphine And Related Other (See Comments)    HALLUCINATIONS    Patient Measurements: Height: 5' 6.5" (168.9 cm) Weight: 164 lb (74.39 kg) IBW/kg (Calculated) : 60.45 Heparin Dosing Weight: 74.3 kg  Vital Signs: Temp: 97.7 F (36.5 C) (02/11 0640) Temp src: Oral (02/11 0640) BP: 149/51 mmHg (02/11 0640) Pulse Rate: 66 (02/11 0640)  Labs:  Recent Labs  04/09/13 2220 04/09/13 2228 04/10/13 0540 04/10/13 1505 04/11/13 0045 04/11/13 0830  HGB 10.5* 11.6* 11.0*  --  10.2*  --   HCT 32.3* 34.0* 34.3*  --  32.3*  --   PLT 225  --  214  --  224  --   APTT  --   --   --  34  --   --   LABPROT  --   --   --  15.7*  --   --   INR  --   --   --  1.28  --   --   HEPARINUNFRC  --   --   --   --  0.46 0.63  CREATININE  --  1.20* 1.00  --  0.75  --     Estimated Creatinine Clearance: 62.4 ml/min (by C-G formula based on Cr of 0.75).   Medications:  Scheduled:  . colestipol  1 g Oral BID  . docusate sodium  100 mg Oral BID  . DULoxetine  60 mg Oral QPC lunch  . ezetimibe-simvastatin  1 tablet Oral QHS  . feeding supplement (ENSURE COMPLETE)  237 mL Oral BID BM  . gabapentin  300 mg Oral TID  . hyoscyamine  0.375 mg Oral Q12H  . levETIRAcetam  500 mg Oral BID  . levofloxacin (LEVAQUIN) IV  750 mg Intravenous QHS  . levothyroxine  88 mcg Oral QAC breakfast  . methylPREDNISolone (SOLU-MEDROL) injection  60 mg Intravenous Q12H  . multivitamin with minerals  1 tablet Oral Daily  . pantoprazole  40 mg Oral Daily  . [COMPLETED] warfarin   Does not apply Once  . Warfarin - Pharmacist Dosing Inpatient   Does not apply q1800   Infusions:  . heparin 1,200 Units/hr (04/10/13 1516)    Assessment: 79 YO WF with recent GI bleed,  adrenal insufficiency, adult FTT. Admitted with N/V/D and pain. LE doppler checked for swelling - + for DVT. Started IV heparin per Pharmacy on 2/10, adding warfarin 2/11.  Day #1/5 heparin/warfarin overlap   Heparin level therapeutic but trending up (0.46 > 0.63)  Baseline INR 1.28 on 2/10  CBC low but stable, bruising noted on arms at venapuncture sites  Note recent GI bleed in January - EGD showed ulceration at pyloric channel; nonbleeding duodenal AVM seen and fulgurated. No further work-up planned. Monitor carefully on full-dose anticoag, CBC currently stable  Per CHEST guidelines, patient will need 5 days overlap with heparin or lovenox and warfarin AND INR therapeutic x 24hrs  Note drug-drug interaction with levaquin - can lead to increased INR, monitor closely  Goal of Therapy:  Heparin level 0.3-0.7 units/ml INR 2-3 Monitor platelets by anticoagulation protocol: Yes   Plan:   Decrease heparin slightly 1150 units/hr  Begin warfarin 5mg  PO x 1 today at 10am  Daily heparin level, CBC  Educated patient on  warfarin therapy, she demonstrated good understanding, as her husband is also on it  Peggyann Juba, PharmD, BCPS Pager: [phone] 04/11/2013,11:42 AM

## 2013-04-11 NOTE — Evaluation (Signed)
Physical Therapy Evaluation Patient Details Name: [name] MRN: [id number] DOB: 08-Jun-1934 Today's Date: 04/11/2013 Time: 4259-5638 PT Time Calculation (min): 44 min  PT Assessment / Plan / Recommendation History of Present Illness  79 YO WF with recent GI bleed, adrenal insufficiency, adult FTT. Was in under my care in [place]. Not seen since then. Pt reports episodes of N/V and some non bloody diarrhea. Has gotten weaker and was brought the ER. Unable to ambulate in the ER. Has pain "all over". Some nausea this AM. Back pain is at baseline. Has been taking prednisone. Notes LLE edema since "the first of the year". Breathing is fair with little cough. Dx with LLE DVT.  Clinical Impression  Pt is very weak, required 2 person fro safe transfers to recliner. Legs are shakey, has peripheral neuropathy. Pt will benefit from PT to address problems listed to return to functional mobility.    PT Assessment  Patient needs continued PT services    Follow Up Recommendations  SNF (unless has 24/7 caregivers and improves in strength)    Does the patient have the potential to tolerate intense rehabilitation      Barriers to Discharge        Equipment Recommendations       Recommendations for Other Services OT consult   Frequency Min 3X/week    Precautions / Restrictions Precautions Precautions: Fall   Pertinent Vitals/Pain sats > 90% when on short periods of RA.pt with noted dyspnea with minimal  activity.      Mobility  Bed Mobility Overal bed mobility: Needs Assistance Bed Mobility: Supine to Sit Supine to sit: HOB elevated;Mod assist;Max assist General bed mobility comments: pHOB used to facilitate sitting up right. Pt  had difficulty getting  to upright, difficulty getting UE under her to push up. Transfers Overall transfer level: Needs assistance Equipment used: Rolling walker (2 wheeled) Transfers: Sit to/from [facility] Sit to Stand: +2 physical  assistance;+2 safety/equipment;Max assist Stand pivot transfers: +2 safety/equipment;+2 physical assistance;Mod assist General transfer comment: pt stood x 3 from bed. Each  time improved. Noted legs weak and buckling. Knees braced for taking pivotal steps to get over to the recliner/ Ambulation/Gait Ambulation/Gait assistance: +2 physical assistance;+2 safety/equipment;Max assist;Mod assist Ambulation Distance (Feet): 5 Feet Assistive device: Rolling walker (2 wheeled) Gait Pattern/deviations: Step-to pattern;Steppage    Exercises General Exercises - Lower Extremity Long Arc Quad: AROM;Both;10 reps;Seated Hip ABduction/ADduction: AROM;Both;10 reps;Seated Hip Flexion/Marching: AROM;Both;10 reps;Seated   PT Diagnosis: Difficulty walking;Generalized weakness  PT Problem List: Decreased strength;Decreased activity tolerance;Decreased balance;Decreased mobility;Decreased knowledge of precautions;Decreased safety awareness;Decreased knowledge of use of DME;Impaired sensation PT Treatment Interventions: DME instruction;Gait training;Functional mobility training;Therapeutic activities;Therapeutic exercise;Patient/family education     PT Goals(Current goals can be found in the care plan section) Acute Rehab PT Goals Patient Stated Goal: I want to get up and walk, PT Goal Formulation: With patient Time For Goal Achievement: 04/25/13 Potential to Achieve Goals: Good  Visit Information  Last PT Received On: 04/11/13 Assistance Needed: +2 History of Present Illness: 79 YO WF with recent GI bleed, adrenal insufficiency, adult FTT. Was in under my care in [place]. Not seen since then. Pt reports episodes of N/V and some non bloody diarrhea. Has gotten weaker and was brought the ER. Unable to ambulate in the ER. Has pain "all over". Some nausea this AM. Back pain is at baseline. Has been taking prednisone. Notes LLE edema since "the first of the year". Breathing is fair with little cough. Dx with  LLE  DVT.       Prior [place] expects to be discharged to:: Private residence Living Arrangements: Spouse/significant other Available Help at Discharge: Family Type of Home: House Home Access: Stairs to enter Technical brewer of Steps: 2 Entrance Stairs-Rails: None Home Layout: One level Home Equipment: Tub bench;Walker - 2 wheels;Bedside commode;Adaptive equipment Prior Function Level of Independence: Needs assistance Gait / Transfers Assistance Needed: walks with RW.    Cognition  Cognition Arousal/Alertness: Awake/alert Behavior During Therapy: WFL for tasks assessed/performed Overall Cognitive Status: Within Functional Limits for tasks assessed    Extremity/Trunk Assessment Upper Extremity Assessment Upper Extremity Assessment: Generalized weakness Lower Extremity Assessment Lower Extremity Assessment: RLE deficits/detail;LLE deficits/detail RLE Deficits / Details: grossly 4 /5 strength of hip and knees. RLE Sensation: history of peripheral neuropathy LLE Deficits / Details: hip flexors 3+, noted decreased control of knee extension- tremors.gros LLE Sensation: history of peripheral neuropathy   Balance Balance Overall balance assessment: Needs assistance Sitting-balance support: Bilateral upper extremity supported;Feet supported Sitting balance-Leahy Scale: Fair Standing balance support: Bilateral upper extremity supported;During functional activity Standing balance-Leahy Scale: Poor Standing balance comment: standing w/ RW  End of Session PT - End of Session Equipment Utilized During Treatment: Gait belt Activity Tolerance: Patient limited by fatigue Patient left: in chair;with call bell/phone within reach;with family/visitor present Nurse Communication: Mobility status;Need for lift equipment [facility])  GP     Claretha Cooper 04/11/2013, 3:06 PM Tresa Endo PT [phone]

## 2013-04-11 NOTE — Progress Notes (Signed)
Subjective: Had a pretty good night. Some coughing. No f/c/s/ Tmax 99. No GI issues Leg feels better   Objective: Vital signs in last 24 hours: Temp:  [97.6 F (36.4 C)-99.2 F (37.3 C)] 97.7 F (36.5 C) (02/11 0640) Pulse Rate:  [66-81] 66 (02/11 0640) Resp:  [16-18] 16 (02/11 0640) BP: (88-149)/(47-65) 149/51 mmHg (02/11 0640) SpO2:  [90 %-96 %] 95 % (02/11 0640)  Intake/Output from previous day: 02/10 0701 - 02/11 0700 In: 1519.3 [P.O.:240; I.V.:929.3; IV Piggyback:350] Out: 400 [Urine:400] Intake/Output this shift:    Lying flat in no distress. Face symmetric. Oral membranes moist. Lungs: a few rhonchi. Ht regular abd soft NT. Good BS's. A bit less leg edema. Awake. Alert mentating well  Lab Results   Recent Labs  04/10/13 0540 04/11/13 0045  WBC 10.3 5.8  RBC 3.95 3.69*  HGB 11.0* 10.2*  HCT 34.3* 32.3*  MCV 86.8 87.5  MCH 27.8 27.6  RDW 14.8 14.5  PLT 214 224    Recent Labs  04/10/13 0540 04/11/13 0045  NA 130* 134*  K 3.2* 4.9  CL 93* 100  CO2 23 21  GLUCOSE 96 121*  BUN 13 11  CREATININE 1.00 0.75  CALCIUM 8.0* 8.3*    Studies/Results: Dg Chest 2 View  04/10/2013   CLINICAL DATA:  Hypoxia.  EXAM: CHEST  2 VIEW  COMPARISON:  Same day.  FINDINGS: Stable cardiomediastinal silhouette. Surgical clips are noted in left axillary region. Continued elevation of right hemidiaphragm is noted. Interstitial densities seen in left lung on prior exam appear improved. Stable density is seen laterally in right upper lobe which may represent pneumonia. No pleural effusion or pneumothorax is noted.  IMPRESSION: Improved left lung densities compared to prior exam. Stable right upper lobe density is noted concerning for pneumonia.   Electronically Signed   By: Sabino Dick M.D.   On: 04/10/2013 10:37   Dg Chest Port 1 View  04/10/2013   CLINICAL DATA:  Hypoxia.  EXAM: PORTABLE CHEST - 1 VIEW  COMPARISON:  03/10/2013.  FINDINGS: Chronic elevation of the right  hemidiaphragm. Right basilar atelectasis. Cardiopericardial silhouette appears within normal limits. Surgical clips project over the left chest. There is diffusely increased interstitial opacity, favored to represent interstitial pulmonary edema. Atypical infection such as mycoplasma could also produce this appearance.  Compared to the most recent prior, of the right lateral airspace disease appears resolved.  IMPRESSION: Diffuse interstitial opacity, suggesting interstitial pulmonary edema.   Electronically Signed   By: Dereck Ligas M.D.   On: 04/10/2013 00:40    Scheduled Meds: . colestipol  1 g Oral BID  . docusate sodium  100 mg Oral BID  . DULoxetine  60 mg Oral QPC lunch  . ezetimibe-simvastatin  1 tablet Oral QHS  . feeding supplement (ENSURE COMPLETE)  237 mL Oral BID BM  . gabapentin  300 mg Oral TID  . hyoscyamine  0.375 mg Oral Q12H  . levETIRAcetam  500 mg Oral BID  . levofloxacin (LEVAQUIN) IV  750 mg Intravenous QHS  . levothyroxine  88 mcg Oral QAC breakfast  . methylPREDNISolone (SOLU-MEDROL) injection  60 mg Intravenous Q12H  . multivitamin with minerals  1 tablet Oral Daily  . pantoprazole  40 mg Oral Daily   Continuous Infusions: . heparin 1,200 Units/hr (04/10/13 1516)   PRN Meds:acetaminophen, acetaminophen, alum & mag hydroxide-simeth, HYDROmorphone (DILAUDID) injection, LORazepam, ondansetron (ZOFRAN) IV, ondansetron, polyethylene glycol, zolpidem  Assessment/Plan: PNEUMONIA/UTI: on levaquin. Fever curve OK. WBC OK ACUTE  LLE DVT: on heparin. Will need coumadin due to recent GI bleed and the need for reversibility ADRENAL INSUFFIC: keep IV on for another day ELECTROLYTES: sodium and potassium both better N/V: improved CHRONIC H PYLORI: has not been treated. Will call GI HYPOTHYROID: on Rx SEIZURES :on Rx DEPRESSION: fair FTT: try to reverse BREAST CA : stable  LOS: 2 days   [name] [name] 04/11/2013, 7:30 AM

## 2013-04-12 ENCOUNTER — Inpatient Hospital Stay (HOSPITAL_COMMUNITY): Payer: Medicare Other

## 2013-04-12 LAB — URINE CULTURE

## 2013-04-12 LAB — COMPREHENSIVE METABOLIC PANEL
ALT: 11 U/L (ref 0–35)
AST: 23 U/L (ref 0–37)
Albumin: 2.1 g/dL — ABNORMAL LOW (ref 3.5–5.2)
Alkaline Phosphatase: 76 U/L (ref 39–117)
BUN: 14 mg/dL (ref 6–23)
CALCIUM: 8.3 mg/dL — AB (ref 8.4–10.5)
CO2: 20 meq/L (ref 19–32)
CREATININE: 0.73 mg/dL (ref 0.50–1.10)
Chloride: 101 mEq/L (ref 96–112)
GFR calc Af Amer: >90 mL/min (ref >90)
GFR calc non Af Amer: 81 mL/min — ABNORMAL LOW (ref >90)
Glucose, Bld: 132 mg/dL — ABNORMAL HIGH (ref 70–99)
Potassium: 5.2 mEq/L (ref 3.7–5.3)
SODIUM: 133 meq/L — AB (ref 137–147)
TOTAL PROTEIN: 5.8 g/dL — AB (ref 6.0–8.3)
Total Bilirubin: <0.2 mg/dL — ABNORMAL LOW (ref 0.3–1.2)

## 2013-04-12 LAB — CBC
HEMATOCRIT: 30.5 % — AB (ref 36.0–46.0)
HEMOGLOBIN: 9.4 g/dL — AB (ref 12.0–15.0)
MCH: 27.3 pg (ref 26.0–34.0)
MCHC: 30.8 g/dL (ref 30.0–36.0)
MCV: 88.7 fL (ref 78.0–100.0)
Platelets: 246 10*3/uL (ref 150–400)
RBC: 3.44 MIL/uL — AB (ref 3.87–5.11)
RDW: 14.9 % (ref 11.5–15.5)
WBC: 10.7 10*3/uL — ABNORMAL HIGH (ref 4.0–10.5)

## 2013-04-12 LAB — HEPARIN LEVEL (UNFRACTIONATED): Heparin Unfractionated: 0.49 IU/mL (ref 0.30–0.70)

## 2013-04-12 LAB — PROTIME-INR
INR: 1.12 (ref 0.00–1.49)
PROTHROMBIN TIME: 14.2 s (ref 11.6–15.2)

## 2013-04-12 MED ORDER — LEVOFLOXACIN 750 MG PO TABS
750.0000 mg | ORAL_TABLET | Freq: Every day | ORAL | Status: AC
  Administered 2013-04-12 – 2013-04-16 (×5): 750 mg via ORAL
  Filled 2013-04-12 (×6): qty 1

## 2013-04-12 MED ORDER — PREDNISONE 10 MG PO TABS
10.0000 mg | ORAL_TABLET | Freq: Every day | ORAL | Status: DC
  Administered 2013-04-13 – 2013-04-17 (×5): 10 mg via ORAL
  Filled 2013-04-12 (×8): qty 1

## 2013-04-12 MED ORDER — WARFARIN SODIUM 5 MG PO TABS
5.0000 mg | ORAL_TABLET | Freq: Once | ORAL | Status: AC
  Administered 2013-04-12: 5 mg via ORAL
  Filled 2013-04-12: qty 1

## 2013-04-12 NOTE — Progress Notes (Signed)
Physical Therapy Treatment Patient Details Name: [name] MRN: [id number] DOB: October 22, 1934 Today's Date: 04/12/2013 Time: 4098-1191 PT Time Calculation (min): 35 min  PT Assessment / Plan / Recommendation  History of Present Illness 79 YO WF with recent GI bleed, adrenal insufficiency, adult FTT. Was in under my care in [place]. Not seen since then. Pt reports episodes of N/V and some non bloody diarrhea. Has gotten weaker and was brought the ER. Unable to ambulate in the ER. Has pain "all over". Some nausea this AM. Back pain is at baseline. Has been taking prednisone. Notes LLE edema since "the first of the year". Breathing is fair with little cough. Dx with LLE DVT.   PT Comments   Assisted pt OOB to amb to BR deemed difficult due to severe gait instability and dyspnea.  HIGH FALL RISK as pt was unable to steady self nearly falling twice the sort distance of 5 feet from bed to BR.  RA decreased to 83% and pt c/o SOB.  Amb pt back to bed unable to advance gait due to max c/o fatigue.     Follow Up Recommendations   Pt would greatly benefit from [facility] at SNF However pt declines Stated her mother was at [facility] for years and she knows "how those places are".     Does the patient have the potential to tolerate intense rehabilitation     Barriers to Discharge        Equipment Recommendations       Recommendations for Other Services    Frequency Min 3X/week   Progress towards PT Goals Progress towards PT goals: Progressing toward goals  Plan      Precautions / Restrictions Precautions Precautions: Fall Restrictions Weight Bearing Restrictions: No   Pertinent Vitals/Pain SATURATION QUALIFICATIONS: (This note is used to comply with regulatory documentation for home oxygen)  Patient Saturations on Room Air at Rest = 91%  Patient Saturations on Room Air while Ambulating = 83%  Patient Saturations on 2 Liters of oxygen while Ambulating = 88% (max VC's on deep  breathing)  Please briefly explain why patient needs home oxygen: pt demon 3/4 DOE along with acute PNA    Mobility  Bed Mobility Overal bed mobility: Needs Assistance Bed Mobility: Supine to Sit;Sit to Supine Supine to sit: Min guard Sit to supine: Min guard General bed mobility comments: increased time and increased assist back to bed 2nd fatigue Transfers Overall transfer level: Needs assistance Equipment used: Rolling walker (2 wheeled) Transfers: Sit to/from Stand Sit to Stand: +2 physical assistance;+2 safety/equipment;Mod assist General transfer comment: severe posterior lean and great difficulty to self rise.  Ambulation/Gait Ambulation/Gait assistance: +2 physical assistance;+2 safety/equipment;Max assist Ambulation Distance (Feet): 10 Feet (5 feet to and from BR only due to dyspnea.) Assistive device: Rolling walker (2 wheeled) Gait Pattern/deviations: Step-to pattern Gait velocity: decreased General Gait Details: very unstaedy/drunken gait with severe posterior lean/LOB.  Very limited activity tolerance and demon 3/4 DOE.  RA sats decreased to 83%. Unable to attempt amb in hallway.  Just amb to and from BR fatigued pt.      PT Goals (current goals can now be found in the care plan section)    Visit Information  Last PT Received On: 04/12/13 Assistance Needed: +2 History of Present Illness: 79 YO WF with recent GI bleed, adrenal insufficiency, adult FTT. Was in under my care in [place]. Not seen since then. Pt reports episodes of N/V and some non bloody diarrhea. Has gotten  weaker and was brought the ER. Unable to ambulate in the ER. Has pain "all over". Some nausea this AM. Back pain is at baseline. Has been taking prednisone. Notes LLE edema since "the first of the year". Breathing is fair with little cough. Dx with LLE DVT.    Subjective Data      Cognition       Balance     End of Session PT - End of Session Equipment Utilized During Treatment: Gait  belt Activity Tolerance: Patient limited by fatigue Patient left: in bed;with call bell/phone within reach;with family/visitor present;with nursing/sitter in room Nurse Communication: Other (comment) (dyspnea and decreased O2 RA sats)   Rica Koyanagi  PTA [facility]  Acute  Rehab Pager      [phone]

## 2013-04-12 NOTE — Progress Notes (Signed)
Clinical Social Work Department BRIEF PSYCHOSOCIAL ASSESSMENT 04/12/2013  Patient:  [name], [name]     Account Number:  [id number]     Admit date:  04/09/2013  Clinical Social Worker:  Venia Minks  Date/Time:  04/12/2013 12:00 M  Referred by:  Physician  Date Referred:  04/12/2013 Referred for  SNF Placement   Other Referral:   Interview type:  Patient Other interview type:   spouse    PSYCHOSOCIAL DATA Living Status:  FAMILY Admitted from facility:   Level of care:   Primary support name:  [facility] Primary support relationship to patient:  SPOUSE Degree of support available:   good    CURRENT CONCERNS Current Concerns  Post-Acute Placement   Other Concerns:    SOCIAL WORK ASSESSMENT / PLAN CSW met with patient and patient's spouse at bedside. Patient is alert and oriented X3. CSW discussed recommendation for snf. Patient is adamantly refusing. She states that her son is 40'4 and lives with them and doesnt work and she has her husband. She states that she does not want to go to rehab and is not going. patient is very upset at the idea of rehab. Spouse is hesitant as he feels patient needs rehab but wants patient to do what makes her happy.   Assessment/plan status:   Other assessment/ plan:   Information/referral to community resources:    PATIENT'S/FAMILY'S RESPONSE TO PLAN OF CARE: Patient is refusing rehab. Will refer to care manager for home health.

## 2013-04-12 NOTE — Progress Notes (Signed)
PHARMACIST - PHYSICIAN COMMUNICATION DR:   Forde Dandy CONCERNING: Antibiotic IV to Oral Route Change Policy  RECOMMENDATION: This patient is receiving Levaquin by the intravenous route.  Based on criteria approved by the Pharmacy and Therapeutics Committee, the antibiotic(s) is/are being converted to the equivalent oral dose form(s).   DESCRIPTION: These criteria include:  Patient being treated for a respiratory tract infection, urinary tract infection, cellulitis or clostridium difficile associated diarrhea if on metronidazole  The patient is not neutropenic and does not exhibit a GI malabsorption state  The patient is eating (either orally or via tube) and/or has been taking other orally administered medications for a least 24 hours  The patient is improving clinically and has a Tmax < 100.5  If you have questions about this conversion, please contact the Pharmacy Department  []   [phone] )  Forestine Na []   [phone] )  [facility]ac[facility] Pontes  []   [phone] )  [facility] [x]   [phone] )  [place], PharmD, [place] Pager [phone] 04/12/2013 10:23 AM

## 2013-04-12 NOTE — Progress Notes (Addendum)
Subjective: Had a good night. Slept well. Less coughing. Desats to 88% without O2. Has been up a little Eating well. No nausea. No diarrhea ECHO was fine   Objective: Vital signs in last 24 hours: Temp:  [97.4 F (36.3 C)-97.7 F (36.5 C)] 97.7 F (36.5 C) (02/12 0640) Pulse Rate:  [70-80] 70 (02/12 0640) Resp:  [16] 16 (02/12 0640) BP: (122-135)/(65-74) 129/73 mmHg (02/12 0640) SpO2:  [88 %-98 %] 94 % (02/12 0640)  Intake/Output from previous day: 02/11 0701 - 02/12 0700 In: 258 [P.O.:120; I.V.:138] Out: 601 [Urine:600; Stool:1] Intake/Output this shift:    Lying at 30degrees. No distress. Periorbital and forehead redness. Some rhonchi. Ht regular. abd soft NT, extrems. Less left edema. Patches of eczema on legs. Awake,.alert, mentating well  Lab Results   Recent Labs  04/11/13 0045 04/12/13 0543  WBC 5.8 10.7*  RBC 3.69* 3.44*  HGB 10.2* 9.4*  HCT 32.3* 30.5*  MCV 87.5 88.7  MCH 27.6 27.3  RDW 14.5 14.9  PLT 224 246    Recent Labs  04/11/13 0045 04/12/13 0543  NA 134* 133*  K 4.9 5.2  CL 100 101  CO2 21 20  GLUCOSE 121* 132*  BUN 11 14  CREATININE 0.75 0.73  CALCIUM 8.3* 8.3*    Studies/Results: No results found.  Scheduled Meds: . colestipol  1 g Oral BID  . docusate sodium  100 mg Oral BID  . DULoxetine  60 mg Oral QPC lunch  . ezetimibe-simvastatin  1 tablet Oral QHS  . feeding supplement (ENSURE COMPLETE)  237 mL Oral BID BM  . gabapentin  300 mg Oral TID  . hyoscyamine  0.375 mg Oral Q12H  . levETIRAcetam  500 mg Oral BID  . levofloxacin (LEVAQUIN) IV  750 mg Intravenous QHS  . levothyroxine  88 mcg Oral QAC breakfast  . methylPREDNISolone (SOLU-MEDROL) injection  60 mg Intravenous Q12H  . multivitamin with minerals  1 tablet Oral Daily  . pantoprazole  40 mg Oral Daily  . Warfarin - Pharmacist Dosing Inpatient   Does not apply q1800   Continuous Infusions: . heparin 1,150 Units/hr (04/11/13 1200)   PRN Meds:acetaminophen,  acetaminophen, alum & mag hydroxide-simeth, HYDROmorphone (DILAUDID) injection, LORazepam, ondansetron (ZOFRAN) IV, ondansetron, polyethylene glycol, zolpidem  Assessment/Plan:  PNEUMONIA/ : on levaquin. Fever curve OK. WBC OK . Repeat XR. Still desats easily E COLI UTI: sensitive to levaquin ACUTE LLE DVT: on heparin. Will need coumadin due to recent GI bleed and the need for reversibility . INR subtherapeutic at 1.12 ADRENAL INSUFFIC: change to PO ELECTROLYTES: sodium and potassium both better , Na 133 N/V: improved  CHRONIC H PYLORI: has not been treated. GI Aware. Will treat post acute illness HYPOTHYROID: on Rx  SEIZURES :on Rx  DEPRESSION: fair  FTT: try to reverse  BREAST CA : stable    LOS: 3 days   Jadamarie Butson [name] 04/12/2013, 10:16 AM

## 2013-04-12 NOTE — Progress Notes (Signed)
ANTICOAGULATION CONSULT NOTE - Follow Up  Pharmacy Consult for Heparin/Warfarin Indication: New DVT  Allergies  Allergen Reactions  . Penicillins Anaphylaxis  . Adhesive [Tape] Other (See Comments)    Blisters   . Doxycycline Nausea And Vomiting  . Morphine And Related Other (See Comments)    HALLUCINATIONS    Patient Measurements: Height: 5' 6.5" (168.9 cm) Weight: 164 lb (74.39 kg) IBW/kg (Calculated) : 60.45 Heparin Dosing Weight: 74.3 kg  Vital Signs: Temp: 97.7 F (36.5 C) (02/12 0640) Temp src: Oral (02/12 0640) BP: 129/73 mmHg (02/12 0640) Pulse Rate: 70 (02/12 0640)  Labs:  Recent Labs  04/10/13 0540 04/10/13 1505 04/11/13 0045 04/11/13 0830 04/12/13 0543  HGB 11.0*  --  10.2*  --  9.4*  HCT 34.3*  --  32.3*  --  30.5*  PLT 214  --  224  --  246  APTT  --  34  --   --   --   LABPROT  --  15.7*  --   --  14.2  INR  --  1.28  --   --  1.12  HEPARINUNFRC  --   --  0.46 0.63 0.49  CREATININE 1.00  --  0.75  --  0.73    Estimated Creatinine Clearance: 62.4 ml/min (by C-G formula based on Cr of 0.73).   Medications:  Scheduled:  . colestipol  1 g Oral BID  . docusate sodium  100 mg Oral BID  . DULoxetine  60 mg Oral QPC lunch  . ezetimibe-simvastatin  1 tablet Oral QHS  . feeding supplement (ENSURE COMPLETE)  237 mL Oral BID BM  . gabapentin  300 mg Oral TID  . hyoscyamine  0.375 mg Oral Q12H  . levETIRAcetam  500 mg Oral BID  . levofloxacin (LEVAQUIN) IV  750 mg Intravenous QHS  . levothyroxine  88 mcg Oral QAC breakfast  . methylPREDNISolone (SOLU-MEDROL) injection  60 mg Intravenous Q12H  . multivitamin with minerals  1 tablet Oral Daily  . pantoprazole  40 mg Oral Daily  . Warfarin - Pharmacist Dosing Inpatient   Does not apply q1800   Infusions:  . heparin 1,150 Units/hr (04/11/13 1200)    Assessment: 79 YO WF with recent GI bleed, adrenal insufficiency, adult FTT. Admitted with N/V/D and pain. LE doppler checked for swelling - + for DVT.  Started IV heparin per Pharmacy on 2/10, adding warfarin 2/11. 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 No further work-up planned. Monitor carefully on full-dose anticoag, CBC currently stable  Per CHEST guidelines, patient will need 5 days overlap with heparin or lovenox and warfarin AND INR therapeutic x 24hrs  Note drug-drug interaction with levaquin - can lead to increased INR, monitor closely  Goal of Therapy:  Heparin level 0.3-0.7 units/ml INR 2-3 Monitor platelets by anticoagulation protocol: Yes   Plan:   Continue IV heparin 1150 units/hr  Repeat warfarin 5mg  today  Daily INR, heparin level, CBC  Patient has been educated patient on warfarin therapy, she demonstrated good understanding, as her husband is also on it   Adrian Saran, PharmD, BCPS Pager [phone] 04/12/2013 10:15 AM

## 2013-04-13 LAB — BLOOD GAS, ARTERIAL
Acid-base deficit: 0.6 mmol/L (ref 0.0–2.0)
Bicarbonate: 21.8 mEq/L (ref 20.0–24.0)
DRAWN BY: 331471
O2 Saturation: 94.6 %
PCO2 ART: 29.4 mmHg — AB (ref 35.0–45.0)
Patient temperature: 98.6
TCO2: 19.9 mmol/L (ref 0–100)
pH, Arterial: 7.484 — ABNORMAL HIGH (ref 7.350–7.450)
pO2, Arterial: 70.9 mmHg — ABNORMAL LOW (ref 80.0–100.0)

## 2013-04-13 LAB — COMPREHENSIVE METABOLIC PANEL
ALT: 52 U/L — ABNORMAL HIGH (ref 0–35)
AST: 127 U/L — AB (ref 0–37)
Albumin: 2.2 g/dL — ABNORMAL LOW (ref 3.5–5.2)
Alkaline Phosphatase: 84 U/L (ref 39–117)
BUN: 14 mg/dL (ref 6–23)
CALCIUM: 8.2 mg/dL — AB (ref 8.4–10.5)
CO2: 24 mEq/L (ref 19–32)
Chloride: 104 mEq/L (ref 96–112)
Creatinine, Ser: 0.83 mg/dL (ref 0.50–1.10)
GFR, EST AFRICAN AMERICAN: 77 mL/min — AB (ref >90)
GFR, EST NON AFRICAN AMERICAN: 67 mL/min — AB (ref >90)
GLUCOSE: 93 mg/dL (ref 70–99)
Potassium: 3.8 mEq/L (ref 3.7–5.3)
SODIUM: 139 meq/L (ref 137–147)
TOTAL PROTEIN: 5.5 g/dL — AB (ref 6.0–8.3)
Total Bilirubin: <0.2 mg/dL — ABNORMAL LOW (ref 0.3–1.2)

## 2013-04-13 LAB — CBC
HEMATOCRIT: 29.3 % — AB (ref 36.0–46.0)
Hemoglobin: 9.4 g/dL — ABNORMAL LOW (ref 12.0–15.0)
MCH: 28.5 pg (ref 26.0–34.0)
MCHC: 32.1 g/dL (ref 30.0–36.0)
MCV: 88.8 fL (ref 78.0–100.0)
PLATELETS: 254 10*3/uL (ref 150–400)
RBC: 3.3 MIL/uL — ABNORMAL LOW (ref 3.87–5.11)
RDW: 14.8 % (ref 11.5–15.5)
WBC: 9.7 10*3/uL (ref 4.0–10.5)

## 2013-04-13 LAB — PROTIME-INR
INR: 1.23 (ref 0.00–1.49)
Prothrombin Time: 15.2 seconds (ref 11.6–15.2)

## 2013-04-13 LAB — HEPARIN LEVEL (UNFRACTIONATED): HEPARIN UNFRACTIONATED: 0.47 [IU]/mL (ref 0.30–0.70)

## 2013-04-13 MED ORDER — IPRATROPIUM-ALBUTEROL 0.5-2.5 (3) MG/3ML IN SOLN: 3.0000 mL | Freq: Four times a day (QID) | RESPIRATORY_TRACT | Status: DC | PRN

## 2013-04-13 MED ORDER — ALBUTEROL SULFATE (2.5 MG/3ML) 0.083% IN NEBU
2.5000 mg | INHALATION_SOLUTION | Freq: Two times a day (BID) | RESPIRATORY_TRACT | Status: DC
  Administered 2013-04-13 – 2013-04-16 (×8): 2.5 mg via RESPIRATORY_TRACT
  Filled 2013-04-13 (×8): qty 3

## 2013-04-13 MED ORDER — WARFARIN SODIUM 5 MG PO TABS
5.0000 mg | ORAL_TABLET | Freq: Once | ORAL | Status: AC
  Administered 2013-04-13: 5 mg via ORAL
  Filled 2013-04-13: qty 1

## 2013-04-13 NOTE — Progress Notes (Signed)
Patient began asking odd questions and saying odd things around noon today. Patients husband noticed that she was just not acting like herself. Vital signs stable and oxygen level on room air is 94%. Will continue to monitor.

## 2013-04-13 NOTE — Progress Notes (Signed)
Physical Therapy Treatment Patient Details Name: [name] MRN: [id number] DOB: 1934/04/13 Today's Date: 04/13/2013 Time: 0925-0950 PT Time Calculation (min): 25 min  PT Assessment / Plan / Recommendation  History of Present Illness 79 YO WF with recent GI bleed, adrenal insufficiency, adult FTT. Was in under my care in [place]. Not seen since then. Pt reports episodes of N/V and some non bloody diarrhea. Has gotten weaker and was brought the ER. Unable to ambulate in the ER. Has pain "all over". Some nausea this AM. Back pain is at baseline. Has been taking prednisone. Notes LLE edema since "the first of the year". Breathing is fair with little cough. Dx with LLE DVT.   PT Comments   Pt feeling much better today.  Assisted OOB to amb to BR, pt had NO posterior LOB and demon a much more steady gait as she was able to tolerate amb in hallway.  Still some limited activity tolerance and dyspnea but pt aware when she needed to stop and rest.  Lowest RA was briefly 90%.     Follow Up Recommendations  Home health PT (pt refuses any SNF rec)     Does the patient have the potential to tolerate intense rehabilitation     Barriers to Discharge        Equipment Recommendations   (has a walker and commode at home)    Recommendations for Other Services    Frequency Min 3X/week   Progress towards PT Goals Progress towards PT goals: Progressing toward goals  Plan      Precautions / Restrictions Precautions Precautions: Fall Restrictions Weight Bearing Restrictions: No   Pertinent Vitals/Pain SATURATION QUALIFICATIONS: (This note is used to comply with regulatory documentation for home oxygen)  Patient Saturations on Room Air at Rest = 96%  Patient Saturations on Room Air while Ambulating = 90%  Patient Saturations on          Liters of oxygen while Ambulating =          % (pt did not require)  Please briefly explain why patient needs home oxygen: pt did not require supplemental oxygen    Mobility  Bed Mobility Overal bed mobility: Modified Independent General bed mobility comments: increased time Transfers Overall transfer level: Needs assistance Equipment used: Rolling walker (2 wheeled) Transfers: Sit to/from Stand Sit to Stand: Min guard;Min assist General transfer comment: increased time with no posterior LOB and good use of hands to steady self. Ambulation/Gait Ambulation/Gait assistance: Min guard Ambulation Distance (Feet): 75 Feet (12', 14', 48' sitting rest break between) Assistive device: Rolling walker (2 wheeled) Gait Pattern/deviations: Step-to pattern Gait velocity: WFL General Gait Details: much more steady today and increased amb distance/toerance.  Still required sitting rest breaks however demonstarted activity level.  Lowest RA was briefly 90%.  At rest avg 96%.  With activity avg 91%.  Noted dyspnea with activity but pt aware when to stop and rest.      PT Goals (current goals can now be found in the care plan section)    Visit Information  Last PT Received On: 04/13/13 Assistance Needed: +1 History of Present Illness: 79 YO WF with recent GI bleed, adrenal insufficiency, adult FTT. Was in under my care in [place]. Not seen since then. Pt reports episodes of N/V and some non bloody diarrhea. Has gotten weaker and was brought the ER. Unable to ambulate in the ER. Has pain "all over". Some nausea this AM. Back pain is at baseline. Has been  taking prednisone. Notes LLE edema since "the first of the year". Breathing is fair with little cough. Dx with LLE DVT.    Subjective Data      Cognition       Balance     End of Session PT - End of Session Equipment Utilized During Treatment: Gait belt Activity Tolerance: Patient tolerated treatment well Patient left: in chair;with call bell/phone within reach;with family/visitor present   Rica Koyanagi  PTA [facility]  Acute  Rehab Pager      [phone]

## 2013-04-13 NOTE — Progress Notes (Signed)
Noticed that patient had taken off oxygen. Checked oxygen level and patient was registering in the low 80's. Placed 2L of oxygen and patient immediately jumped back up to 96%. Patient stated that he did not feelshort of breath. Educated patient regarding keeping oxygen on and patient understood. Will continue to monitor.

## 2013-04-13 NOTE — Progress Notes (Signed)
Subjective: Dry non prod cough. Some wheeze . Noted. 3 BM today. Walked a bit with therapy but was unsteady.  No nausea  Objective: Vital signs in last 24 hours: Temp:  [97.3 F (36.3 C)-97.6 F (36.4 C)] 97.4 F (36.3 C) (02/13 0744) Pulse Rate:  [74-81] 74 (02/13 0744) Resp:  [18] 18 (02/13 0744) BP: (123-158)/(62-103) 158/98 mmHg (02/13 0744) SpO2:  [83 %-97 %] 94 % (02/13 0744) Weight:  [76.749 kg (169 lb 3.2 oz)] 76.749 kg (169 lb 3.2 oz) (02/13 0611)  Intake/Output from previous day: 02/12 0701 - 02/13 0700 In: 240 [P.O.:240] Out: 1200 [Urine:1200] Intake/Output this shift: Total I/O In: 120 [P.O.:120] Out: 300 [Urine:300]  Non toxic. Lying flat. O2 in place. Min expir wheeze. Ht regular  abd soft NT, extrems 1+ bilat edema Awake. alert  Lab Results   Recent Labs  04/12/13 0543 04/13/13 0443  WBC 10.7* 9.7  RBC 3.44* 3.30*  HGB 9.4* 9.4*  HCT 30.5* 29.3*  MCV 88.7 88.8  MCH 27.3 28.5  RDW 14.9 14.8  PLT 246 254    Recent Labs  04/12/13 0543 04/13/13 0443  NA 133* 139  K 5.2 3.8  CL 101 104  CO2 20 24  GLUCOSE 132* 93  BUN 14 14  CREATININE 0.73 0.83  CALCIUM 8.3* 8.2*    Studies/Results: Dg Chest 2 View  04/12/2013   CLINICAL DATA:  History of pneumonia, shortness of breath, former smoker  EXAM: CHEST  2 VIEW  COMPARISON:  DG CHEST 2 VIEW dated 04/10/2013; DG CHEST 2 VIEW dated 03/10/2013; NM PET IMAGE RESTAG (PS) SKULL BASE TO THIGH dated 03/14/2012; DG CHEST 1V PORT dated 04/10/2013; DG ABD ACUTE W/CHEST dated 02/22/2012  FINDINGS: Grossly unchanged cardiac silhouette and mediastinal contours with persistent rightward deviation of the tracheal air column at the level of the thoracic inlet. Retrocardiac opacity is favored to represent a hiatal hernia. There is chronic mild elevation of the right hemidiaphragm. The lungs remain hyperexpanded with flattening of the bilateral hemidiaphragms. No change to minimal increase and small bilateral pleural  effusions, right greater than left. Peripheral heterogeneous slightly nodular opacities within in the right mid and upper lung are grossly unchanged. There is grossly unchanged mild diffuse slightly nodular thickening of the pulmonary interstitium. No new focal airspace opacities. No evidence of edema. No pneumothorax. Post left-sided mastectomy. Grossly unchanged bones including mild lower thoracic/upper lumbar compression fracture.  IMPRESSION: 1. No significant interval change in peripheral predominant heterogeneous opacities within the right upper and mid lung - while possibly representing of atelectasis or scar, residual underlying infection is not excluded. A follow-up chest radiograph in 4 to 6 weeks after treatment is recommended to ensure resolution. 2. No change to minimal increase in small bilateral effusions, right greater than left. No definite evidence of edema. 3. Suspected hiatal hernia.   Electronically Signed   By: Sandi Mariscal M.D.   On: 04/12/2013 14:17    Scheduled Meds: . colestipol  1 g Oral BID  . docusate sodium  100 mg Oral BID  . DULoxetine  60 mg Oral QPC lunch  . ezetimibe-simvastatin  1 tablet Oral QHS  . feeding supplement (ENSURE COMPLETE)  237 mL Oral BID BM  . gabapentin  300 mg Oral TID  . hyoscyamine  0.375 mg Oral Q12H  . levETIRAcetam  500 mg Oral BID  . levofloxacin  750 mg Oral QHS  . levothyroxine  88 mcg Oral QAC breakfast  . multivitamin with minerals  1  tablet Oral Daily  . pantoprazole  40 mg Oral Daily  . predniSONE  10 mg Oral Q breakfast  . warfarin  5 mg Oral ONCE-1800  . Warfarin - Pharmacist Dosing Inpatient   Does not apply q1800   Continuous Infusions: . heparin 1,150 Units/hr (04/11/13 1200)   PRN Meds:acetaminophen, acetaminophen, alum & mag hydroxide-simeth, HYDROmorphone (DILAUDID) injection, LORazepam, ondansetron (ZOFRAN) IV, ondansetron, polyethylene glycol, zolpidem  Assessment/Plan:  PNEUMONIA : on levaquin. Fever curve OK. WBC OK  . Repeat XR. Still desats easily. CXR stable to a bit better. Add some nebs E COLI UTI: sensitive to levaquin  ACUTE LLE DVT: on heparin. Will need coumadin due to recent GI bleed and the need for reversibility . INR subtherapeutic at 1.23. Will D/C when therapeutic, Hgb holding at 9.4 ADRENAL INSUFFIC: change to PO did fine  ELECTROLYTES:resolved hyponatremia at 139 N/V: improved  CHRONIC H PYLORI: has not been treated. GI Aware. Will treat post acute illness  HYPOTHYROID: on Rx  SEIZURES :on Rx  DEPRESSION: fair  FTT: try to reverse  BREAST CA : stable OSTEOPOROSIS WITH COMPRESSION FX: pain is under control    LOS: 4 days   [name] [name] 04/13/2013, 9:18 AM

## 2013-04-13 NOTE — Care Management Note (Addendum)
    Page 1 of 1   04/13/2013     1:09:56 PM   CARE MANAGEMENT NOTE 04/13/2013  Patient:  [name], [name]   Account Number:  [id number]  Date Initiated:  04/13/2013  Documentation initiated by:  Dessa Phi  Subjective/Objective Assessment:   78 [place].HX:DVT,PE,BREAST CA.     Action/Plan:   FROM HOME.HAS PCP,PHARMACY.   Anticipated DC Date:  04/16/2013   Anticipated DC Plan:  [place]  CM consult      Choice offered to / List presented to:  C-1 Patient           Status of service:  In process, will continue to follow Medicare Important Message given?   (If response is "NO", the following Medicare IM given date fields will be blank) Date Medicare IM given:   Date Additional Medicare IM given:    Discharge Disposition:    Per UR Regulation:  Reviewed for med. necessity/level of care/duration of stay  If discussed at [place] of Stay Meetings, dates discussed:    Comments:  04/13/13 Yaa Donnellan RN,BSN NCM [phone] PATIENT DECLINES SNF.[facility] CHOSEN FOR HH.RECOMMEND HHRN/PT/OT/AIDE.TC [facility] REP KRISTEN AWARE OF REFERRAL.AWAIT FINAL HHC ORDERS.

## 2013-04-13 NOTE — Progress Notes (Signed)
ANTICOAGULATION CONSULT NOTE - Follow Up  Pharmacy Consult for Heparin/Warfarin Indication: New DVT  Allergies  Allergen Reactions  . Penicillins Anaphylaxis  . Adhesive [Tape] Other (See Comments)    Blisters   . Doxycycline Nausea And Vomiting  . Morphine And Related Other (See Comments)    HALLUCINATIONS    Patient Measurements: Height: 5' 6.5" (168.9 cm) Weight: 169 lb 3.2 oz (76.749 kg) IBW/kg (Calculated) : 60.45 Heparin Dosing Weight: 74.3 kg  Vital Signs: Temp: 97.4 F (36.3 C) (02/13 0744) Temp src: Oral (02/13 0744) BP: 158/98 mmHg (02/13 0744) Pulse Rate: 74 (02/13 0744)  Labs:  Recent Labs  04/10/13 1505  04/11/13 0045 04/11/13 0830 04/12/13 0543 04/13/13 0443  HGB  --   < > 10.2*  --  9.4* 9.4*  HCT  --   --  32.3*  --  30.5* 29.3*  PLT  --   --  224  --  246 254  APTT 34  --   --   --   --   --   LABPROT 15.7*  --   --   --  14.2 15.2  INR 1.28  --   --   --  1.12 1.23  HEPARINUNFRC  --   < > 0.46 0.63 0.49 0.47  CREATININE  --   --  0.75  --  0.73 0.83  < > = values in this interval not displayed.  Estimated Creatinine Clearance: 61 ml/min (by C-G formula based on Cr of 0.83).   Medications:  Scheduled:  . colestipol  1 g Oral BID  . docusate sodium  100 mg Oral BID  . DULoxetine  60 mg Oral QPC lunch  . ezetimibe-simvastatin  1 tablet Oral QHS  . feeding supplement (ENSURE COMPLETE)  237 mL Oral BID BM  . gabapentin  300 mg Oral TID  . hyoscyamine  0.375 mg Oral Q12H  . levETIRAcetam  500 mg Oral BID  . levofloxacin  750 mg Oral QHS  . levothyroxine  88 mcg Oral QAC breakfast  . multivitamin with minerals  1 tablet Oral Daily  . pantoprazole  40 mg Oral Daily  . predniSONE  10 mg Oral Q breakfast  . Warfarin - Pharmacist Dosing Inpatient   Does not apply q1800   Infusions:  . heparin 1,150 Units/hr (04/11/13 1200)    Assessment: 79 YO WF with recent GI bleed, adrenal insufficiency, adult FTT. Admitted with N/V/D and pain. LE  doppler checked for swelling - + for DVT. Started IV heparin per Pharmacy on 2/10, adding warfarin 2/11. Baseline INR 1.28 on 2/10. 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 No further work-up planned. Monitor carefully on full-dose anticoag, CBC currently stable  Per CHEST guidelines, patient will need 5 days overlap with heparin or lovenox and warfarin AND INR therapeutic x 24hrs  Note drug-drug interaction with levaquin - can lead to increased INR, monitor closely  Goal of Therapy:  Heparin level 0.3-0.7 units/ml INR 2-3 Monitor platelets by anticoagulation protocol: Yes   Plan:   Continue  IV heparin 1150 units/hr  Repeat warfarin 5mg  today  Daily INR, heparin level, CBC  Patient has been educated patient on warfarin therapy, she demonstrated good understanding, as her husband is also on it  Adrian Saran, PharmD, BCPS Pager [phone] 04/13/2013 8:59 AM

## 2013-04-14 ENCOUNTER — Inpatient Hospital Stay (HOSPITAL_COMMUNITY): Payer: Medicare Other

## 2013-04-14 LAB — HEPARIN LEVEL (UNFRACTIONATED)
Heparin Unfractionated: 1.03 IU/mL — ABNORMAL HIGH (ref 0.30–0.70)
Heparin Unfractionated: 0.39 IU/mL (ref 0.30–0.70)

## 2013-04-14 LAB — CBC
HEMATOCRIT: 34 % — AB (ref 36.0–46.0)
HEMOGLOBIN: 10.6 g/dL — AB (ref 12.0–15.0)
MCH: 27.4 pg (ref 26.0–34.0)
MCHC: 31.2 g/dL (ref 30.0–36.0)
MCV: 87.9 fL (ref 78.0–100.0)
Platelets: 233 10*3/uL (ref 150–400)
RBC: 3.87 MIL/uL (ref 3.87–5.11)
RDW: 15.2 % (ref 11.5–15.5)
WBC: 8.4 10*3/uL (ref 4.0–10.5)

## 2013-04-14 LAB — PROTIME-INR
INR: 1.6 — ABNORMAL HIGH (ref 0.00–1.49)
Prothrombin Time: 18.6 seconds — ABNORMAL HIGH (ref 11.6–15.2)

## 2013-04-14 MED ORDER — WARFARIN SODIUM 4 MG PO TABS
4.0000 mg | ORAL_TABLET | Freq: Once | ORAL | Status: AC
  Administered 2013-04-14: 4 mg via ORAL
  Filled 2013-04-14: qty 1

## 2013-04-14 MED ORDER — HEPARIN (PORCINE) IN NACL 100-0.45 UNIT/ML-% IJ SOLN
900.0000 [IU]/h | INTRAMUSCULAR | Status: DC
  Administered 2013-04-14 – 2013-04-16 (×3): 900 [IU]/h via INTRAVENOUS
  Filled 2013-04-14 (×3): qty 250

## 2013-04-14 NOTE — Progress Notes (Signed)
Patient has occasional episodes of confusion and hallucinations. Patient takes a few minutes to re-orient. MD aware. Family very concerned and states that she is not like this at home. They can't remember her ever having moments of confusion before.

## 2013-04-14 NOTE — Progress Notes (Signed)
ANTICOAGULATION CONSULT NOTE - Follow Up  Pharmacy Consult for Heparin/Warfarin Indication: New DVT  Allergies  Allergen Reactions  . Penicillins Anaphylaxis  . Adhesive [Tape] Other (See Comments)    Blisters   . Doxycycline Nausea And Vomiting  . Morphine And Related Other (See Comments)    HALLUCINATIONS    Patient Measurements: Height: 5' 6.5" (168.9 cm) Weight: 169 lb 3.2 oz (76.749 kg) IBW/kg (Calculated) : 60.45 Heparin Dosing Weight: 74.3 kg  Vital Signs: Temp: 98.3 F (36.8 C) (02/14 0603) Temp src: Oral (02/14 0603) BP: 110/76 mmHg (02/14 0603) Pulse Rate: 80 (02/14 0603)  Labs:  Recent Labs  04/12/13 0543 04/13/13 0443 04/14/13 0606  HGB 9.4* 9.4* 10.6*  HCT 30.5* 29.3* 34.0*  PLT 246 254 233  LABPROT 14.2 15.2 18.6*  INR 1.12 1.23 1.60*  HEPARINUNFRC 0.49 0.47 1.03*  CREATININE 0.73 0.83  --     Estimated Creatinine Clearance: 61 ml/min (by C-G formula based on Cr of 0.83).   Medications:  Scheduled:  . albuterol  2.5 mg Nebulization BID  . colestipol  1 g Oral BID  . docusate sodium  100 mg Oral BID  . DULoxetine  60 mg Oral QPC lunch  . ezetimibe-simvastatin  1 tablet Oral QHS  . feeding supplement (ENSURE COMPLETE)  237 mL Oral BID BM  . gabapentin  300 mg Oral TID  . hyoscyamine  0.375 mg Oral Q12H  . levETIRAcetam  500 mg Oral BID  . levofloxacin  750 mg Oral QHS  . levothyroxine  88 mcg Oral QAC breakfast  . multivitamin with minerals  1 tablet Oral Daily  . pantoprazole  40 mg Oral Daily  . predniSONE  10 mg Oral Q breakfast  . Warfarin - Pharmacist Dosing Inpatient   Does not apply q1800   Infusions:  . heparin 1,150 Units/hr (04/14/13 1057)    Assessment: 79 YO WF with recent GI bleed, adrenal insufficiency, adult FTT. Admitted with N/V/D and pain. LE doppler checked for swelling - + for DVT. Started IV heparin per Pharmacy on 2/10, adding warfarin 2/11. Baseline INR 1.28 on 2/10. Note that Lovenox not chosen per Md due to  recent GIB and potential need for rapid anticoagulation reversal if need be  Day #4/5 heparin/warfarin overlap   Heparin level suddenly supratherapeutic today, resulted at 1.03  INR with quick trend up to 1.6, remains below goal but trending up as expected   CBC today is improving with Hgb 10.6/Plt 233.  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 No further work-up planned. Monitor carefully on full-dose anticoag, CBC currently stable  Per CHEST guidelines, patient will need 5 days overlap with heparin or lovenox and warfarin AND INR therapeutic x 24hrs  Note drug-drug interaction with levaquin started 2/12 - can lead to increased INR, monitor closely  Goal of Therapy:  Heparin level 0.3-0.7 units/ml INR 2-3 Monitor platelets by anticoagulation protocol: Yes   Plan:   Hold heparin x 1 hour, then reduce heparin gtt to 900 units/hr  Warfarin 4 mg today  Daily INR, heparin level, CBC  Warfarin education completed  [name], [name] PharmD Pager #: [phone] 11:50 AM 04/14/2013

## 2013-04-14 NOTE — Progress Notes (Signed)
Subjective: Lying in bed no apparent distress answering all questions appropriately, some questions of altered mental status intermittently within the last 24-48 hours, husband present and a knowledge is the same, denies any overt shortness breath and indeed states that her breathing is better, has been walking with physical therapy down the hall without any desaturation per staff report on room air, as opposed to the previous day when she did desaturate quite readily, continues to have a nonproductive cough, but no overt shortness of breath, palpitations, nausea, vomiting but appetite has and continues to be suppressed, continues on low-dose prednisone, as well as Levaquin, no bleeding complications on combination of heparin therapy and Coumadin, being monitored by pharmacy protocol. Denies any headache, or focal neurologic deficits other than her chronic peripheral neuropathy.  Objective: Vital signs in last 24 hours: Temp:  [97.7 F (36.5 C)-98.3 F (36.8 C)] 98.3 F (36.8 C) (02/14 0603) Pulse Rate:  [76-80] 80 (02/14 0603) Resp:  [18] 18 (02/14 0603) BP: (110-129)/(50-76) 110/76 mmHg (02/14 0603) SpO2:  [83 %-97 %] 93 % (02/14 0919) Weight change:  Last BM Date: 04/14/13  CBG (last 3)  No results found for this basename: GLUCAP,  in the last 72 hours  Intake/Output from previous day: 02/13 0701 - 02/14 0700 In: 120 [P.O.:120] Out: 300 [Urine:300] Intake/Output this shift:   Physical exam general appearance Obese, no apparent distress lying flat in bed, no respiratory distress, answering questions appropriately at this time Sclera anicteric extraconal movements are intact Oropharyngeal lesions No respiratory distress, lungs clear to auscultation, no bronchospasm appreciated Cardiovascular reveals regular rate and rhythm Abdomen soft, nontender Extremities reveal 1-2+ bilateral edema, with thin skin, multiple ecchymoses Alert and oriented x3 Extremities: no clubbing, cyanosis or  edema   Lab Results:  Recent Labs  04/12/13 0543 04/13/13 0443  NA 133* 139  K 5.2 3.8  CL 101 104  CO2 20 24  GLUCOSE 132* 93  BUN 14 14  CREATININE 0.73 0.83  CALCIUM 8.3* 8.2*    Recent Labs  04/12/13 0543 04/13/13 0443  AST 23 127*  ALT 11 52*  ALKPHOS 76 84  BILITOT <0.2* <0.2*  PROT 5.8* 5.5*  ALBUMIN 2.1* 2.2*    Recent Labs  04/13/13 0443 04/14/13 0606  WBC 9.7 8.4  HGB 9.4* 10.6*  HCT 29.3* 34.0*  MCV 88.8 87.9  PLT 254 233   No results found for this basename: CKTOTAL, CKMB, CKMBINDEX, TROPONINI,  in the last 72 hours No results found for this basename: TSH, T4TOTAL, FREET3, T3FREE, THYROIDAB,  in the last 72 hours No results found for this basename: VITAMINB12, FOLATE, FERRITIN, TIBC, IRON, RETICCTPCT,  in the last 72 hours  Studies/Results: Dg Chest 2 View  04/12/2013   CLINICAL DATA:  History of pneumonia, shortness of breath, former smoker  EXAM: CHEST  2 VIEW  COMPARISON:  DG CHEST 2 VIEW dated 04/10/2013; DG CHEST 2 VIEW dated 03/10/2013; NM PET IMAGE RESTAG (PS) SKULL BASE TO THIGH dated 03/14/2012; DG CHEST 1V PORT dated 04/10/2013; DG ABD ACUTE W/CHEST dated 02/22/2012  FINDINGS: Grossly unchanged cardiac silhouette and mediastinal contours with persistent rightward deviation of the tracheal air column at the level of the thoracic inlet. Retrocardiac opacity is favored to represent a hiatal hernia. There is chronic mild elevation of the right hemidiaphragm. The lungs remain hyperexpanded with flattening of the bilateral hemidiaphragms. No change to minimal increase and small bilateral pleural effusions, right greater than left. Peripheral heterogeneous slightly nodular opacities within in the  right mid and upper lung are grossly unchanged. There is grossly unchanged mild diffuse slightly nodular thickening of the pulmonary interstitium. No new focal airspace opacities. No evidence of edema. No pneumothorax. Post left-sided mastectomy. Grossly unchanged  bones including mild lower thoracic/upper lumbar compression fracture.  IMPRESSION: 1. No significant interval change in peripheral predominant heterogeneous opacities within the right upper and mid lung - while possibly representing of atelectasis or scar, residual underlying infection is not excluded. A follow-up chest radiograph in 4 to 6 weeks after treatment is recommended to ensure resolution. 2. No change to minimal increase in small bilateral effusions, right greater than left. No definite evidence of edema. 3. Suspected hiatal hernia.   Electronically Signed   By: Sandi Mariscal M.D.   On: 04/12/2013 14:17     Medications: Scheduled: . albuterol  2.5 mg Nebulization BID  . colestipol  1 g Oral BID  . docusate sodium  100 mg Oral BID  . DULoxetine  60 mg Oral QPC lunch  . ezetimibe-simvastatin  1 tablet Oral QHS  . feeding supplement (ENSURE COMPLETE)  237 mL Oral BID BM  . gabapentin  300 mg Oral TID  . hyoscyamine  0.375 mg Oral Q12H  . levETIRAcetam  500 mg Oral BID  . levofloxacin  750 mg Oral QHS  . levothyroxine  88 mcg Oral QAC breakfast  . multivitamin with minerals  1 tablet Oral Daily  . pantoprazole  40 mg Oral Daily  . predniSONE  10 mg Oral Q breakfast  . warfarin  4 mg Oral ONCE-1800  . Warfarin - Pharmacist Dosing Inpatient   Does not apply q1800   Continuous: . heparin      Assessment/Plan: Principal Problem:   UTI (urinary tract infection) Escherichia coli sensitive to Pneumonia, on Levaquin, temperature curve improving, white blood cell count stable, chest x-ray repeated with no significant change continues on nebulizer treatments, no bronchospasm on exam. History of hypoxia this hospitalization, currently on room air, with reports of no desaturation with ambulation, will be rechecked by staff Active Problems:   Adrenal insufficiency-hemodynamically stable on oral   History of H. pylori-with PCP to treat on an outpatient basis after acute issues resolved,  asymptomatic with respect to abdominal discomfort but complicated by some anorexia thought to be multifactorial continues on proton pump inhibitor DVT-on combination heparin, as well as Coumadin, with thought might be a process to have easy reversibility given history of peptic ulcer disease and bleeding. Depression stable on current medications Failure to thrive, hopefully with treatment so will improve, need to watch out for protein calorie malnutrition Breast cancer initially diagnosed on the left 16 years ago now on the right 2 years ago, status post surgical correction, radiation treatment and chemotherapy   Altered mental status, not apparent at this time however given intermittent issues over the last several days to be multifactorial secondary to hospitalization, medications, no focal neurologic deficits that are new, however given initiation of anticoagulation and history of peptic ulcer disease, will check head CT without contrast rule out bleed.  LOS: 5 days   Mackey Varricchio R 04/14/2013, 12:09 PM

## 2013-04-14 NOTE — Progress Notes (Signed)
Patient oxygen level on RA while at rest was 94%.

## 2013-04-14 NOTE — Progress Notes (Signed)
ANTICOAGULATION CONSULT NOTE - Follow Up  Pharmacy Consult for Heparin/Warfarin Indication: New DVT  Labs:  Recent Labs  04/12/13 0543 04/13/13 0443 04/14/13 0606 04/14/13 1838  HGB 9.4* 9.4* 10.6*  --   HCT 30.5* 29.3* 34.0*  --   PLT 246 254 233  --   LABPROT 14.2 15.2 18.6*  --   INR 1.12 1.23 1.60*  --   HEPARINUNFRC 0.49 0.47 1.03* 0.39  CREATININE 0.73 0.83  --   --    Infusions:  . heparin 900 Units/hr (04/14/13 1358)    Assessment: See note from earlier today for more detail.  Patient on day #4 IV heparin for DVT.  Noted history of recent GIB.  Heparin infusion rate reduced this afternoon for supratherapeutic level.  Heparin level now therapeutic at 0.39 with heparin infusion at 900 units/hr.  No bleeding/complications reported.    Goal of Therapy:  Heparin level 0.3-0.7 units/ml Monitor platelets by anticoagulation protocol: Yes   Plan:   Continue heparin infusion at 900 units/hr.  Repeat heparin level at 0230 2/16.  Hershal Coria, PharmD, BCPS Pager: [phone] 04/14/2013 7:22 PM

## 2013-04-15 LAB — CBC
HEMATOCRIT: 30.3 % — AB (ref 36.0–46.0)
HEMOGLOBIN: 9.6 g/dL — AB (ref 12.0–15.0)
MCH: 27.8 pg (ref 26.0–34.0)
MCHC: 31.7 g/dL (ref 30.0–36.0)
MCV: 87.8 fL (ref 78.0–100.0)
Platelets: 247 10*3/uL (ref 150–400)
RBC: 3.45 MIL/uL — AB (ref 3.87–5.11)
RDW: 15.5 % (ref 11.5–15.5)
WBC: 9.6 10*3/uL (ref 4.0–10.5)

## 2013-04-15 LAB — BASIC METABOLIC PANEL WITH GFR
BUN: 11 mg/dL (ref 6–23)
CO2: 23 meq/L (ref 19–32)
Calcium: 8.2 mg/dL — ABNORMAL LOW (ref 8.4–10.5)
Chloride: 104 meq/L (ref 96–112)
Creatinine, Ser: 0.8 mg/dL (ref 0.50–1.10)
GFR calc Af Amer: 81 mL/min — ABNORMAL LOW
GFR calc non Af Amer: 70 mL/min — ABNORMAL LOW
Glucose, Bld: 100 mg/dL — ABNORMAL HIGH (ref 70–99)
Potassium: 3.3 meq/L — ABNORMAL LOW (ref 3.7–5.3)
Sodium: 139 meq/L (ref 137–147)

## 2013-04-15 LAB — PROTIME-INR
INR: 1.82 — ABNORMAL HIGH (ref 0.00–1.49)
Prothrombin Time: 20.5 s — ABNORMAL HIGH (ref 11.6–15.2)

## 2013-04-15 LAB — HEPARIN LEVEL (UNFRACTIONATED): Heparin Unfractionated: 0.35 [IU]/mL (ref 0.30–0.70)

## 2013-04-15 MED ORDER — VITAMINS A & D EX OINT
TOPICAL_OINTMENT | CUTANEOUS | Status: DC | PRN
  Administered 2013-04-15: 5 via TOPICAL
  Filled 2013-04-15: qty 5

## 2013-04-15 MED ORDER — WARFARIN SODIUM 5 MG PO TABS
5.0000 mg | ORAL_TABLET | Freq: Once | ORAL | Status: AC
  Administered 2013-04-15: 5 mg via ORAL
  Filled 2013-04-15: qty 1

## 2013-04-15 MED ORDER — POTASSIUM CHLORIDE CRYS ER 20 MEQ PO TBCR
20.0000 meq | EXTENDED_RELEASE_TABLET | Freq: Two times a day (BID) | ORAL | Status: AC
  Administered 2013-04-15 (×2): 20 meq via ORAL
  Filled 2013-04-15 (×2): qty 1

## 2013-04-15 NOTE — Progress Notes (Signed)
ANTICOAGULATION CONSULT NOTE - Follow Up  Pharmacy Consult for Heparin/Warfarin Indication: New DVT  Allergies  Allergen Reactions  . Penicillins Anaphylaxis  . Adhesive [Tape] Other (See Comments)    Blisters   . Doxycycline Nausea And Vomiting  . Morphine And Related Other (See Comments)    HALLUCINATIONS    Patient Measurements: Height: 5' 6.5" (168.9 cm) Weight: 169 lb 3.2 oz (76.749 kg) IBW/kg (Calculated) : 60.45 Heparin Dosing Weight: 74.3 kg  Vital Signs: Temp: 98.7 F (37.1 C) (02/15 0535) Temp src: Oral (02/15 0535) BP: 119/51 mmHg (02/15 0535) Pulse Rate: 90 (02/15 0535)  Labs:  Recent Labs  04/13/13 0443 04/14/13 0606 04/14/13 1838 04/15/13 0234  HGB 9.4* 10.6*  --  9.6*  HCT 29.3* 34.0*  --  30.3*  PLT 254 233  --  247  LABPROT 15.2 18.6*  --  20.5*  INR 1.23 1.60*  --  1.82*  HEPARINUNFRC 0.47 1.03* 0.39 0.35  CREATININE 0.83  --   --  0.80    Estimated Creatinine Clearance: 63.3 ml/min (by C-G formula based on Cr of 0.8).   Medications:  Scheduled:  . albuterol  2.5 mg Nebulization BID  . colestipol  1 g Oral BID  . docusate sodium  100 mg Oral BID  . DULoxetine  60 mg Oral QPC lunch  . ezetimibe-simvastatin  1 tablet Oral QHS  . feeding supplement (ENSURE COMPLETE)  237 mL Oral BID BM  . gabapentin  300 mg Oral TID  . hyoscyamine  0.375 mg Oral Q12H  . levETIRAcetam  500 mg Oral BID  . levofloxacin  750 mg Oral QHS  . levothyroxine  88 mcg Oral QAC breakfast  . multivitamin with minerals  1 tablet Oral Daily  . pantoprazole  40 mg Oral Daily  . predniSONE  10 mg Oral Q breakfast  . Warfarin - Pharmacist Dosing Inpatient   Does not apply q1800   Infusions:  . heparin 900 Units/hr (04/14/13 1358)    Assessment: 79 YO WF with recent GI bleed, adrenal insufficiency, adult FTT. Admitted with N/V/D and pain. LE doppler checked for swelling - + for DVT. Started IV heparin per Pharmacy on 2/10, adding warfarin 2/11. Baseline INR 1.28 on  2/10. 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 No further work-up planned. Monitor carefully on full-dose anticoag, CBC currently stable  Per CHEST guidelines, patient will need 5 days overlap with heparin or lovenox and warfarin AND INR therapeutic x 24hrs  Note drug-drug interaction with levaquin started 2/12 - can lead to increased INR, monitor closely  Goal of Therapy:  Heparin level 0.3-0.7 units/ml INR 2-3 Monitor platelets by anticoagulation protocol: Yes   Plan:   Continue heparin gtt at 900 units/hr  Warfarin 5 mg today - INR moving toward goal   Daily INR, heparin level, CBC  Warfarin education completed  Terese Heier, Gaye Alken PharmD Pager #: [phone] 7:10 AM 04/15/2013

## 2013-04-15 NOTE — Progress Notes (Signed)
Subjective: Lying in bed no apparent distress answering all questions appropriately, just completed nebulizer treatments, some questions of altered mental status intermittently within the last 24-48 hours, husband present and a knowledge is the same, denies any overt shortness breath and indeed states that her breathing is better, has been walking with physical therapy down the hall without any desaturation per staff report on room air, continues to have a nonproductive cough, but no overt shortness of breath, palpitations, nausea, vomiting but appetite has and continues to be suppressed, continues on low-dose prednisone, as well as Levaquin, no bleeding complications on combination of heparin therapy and Coumadin, being monitored by pharmacy protocol. Denies any headache, or focal neurologic deficits other than her chronic peripheral neuropathy.  Objective: Vital signs in last 24 hours: Temp:  [97.8 F (36.6 C)-98.7 F (37.1 C)] 98.7 F (37.1 C) (02/15 0535) Pulse Rate:  [81-90] 90 (02/15 0535) Resp:  [20] 20 (02/15 0535) BP: (119-145)/(51-78) 119/51 mmHg (02/15 0535) SpO2:  [93 %-97 %] 93 % (02/15 0535) Weight change:  Last BM Date: 04/15/13  CBG (last 3)  No results found for this basename: GLUCAP,  in the last 72 hours  Intake/Output from previous day: 02/14 0701 - 02/15 0700 In: 1144.4 [P.O.:360; I.V.:784.4] Out: 1 [Stool:1] Intake/Output this shift: Total I/O In: 240 [P.O.:240] Out: -  Physical exam general appearance Obese, no apparent distress lying flat in bed, no respiratory distress, answering questions appropriately at this time Sclera anicteric extraconal movements are intact Oropharyngeal lesions No respiratory distress, lungs clear to auscultation, no bronchospasm appreciated Cardiovascular reveals regular rate and rhythm Abdomen soft, nontender Extremities reveal 1-2+ bilateral edema left greater than right, with thin skin, some  ecchymoses Alert and oriented  x3 Extremities: no clubbing, cyanosis.   Lab Results:  Recent Labs  04/13/13 0443 04/15/13 0234  NA 139 139  K 3.8 3.3*  CL 104 104  CO2 24 23  GLUCOSE 93 100*  BUN 14 11  CREATININE 0.83 0.80  CALCIUM 8.2* 8.2*    Recent Labs  04/13/13 0443  AST 127*  ALT 52*  ALKPHOS 84  BILITOT <0.2*  PROT 5.5*  ALBUMIN 2.2*    Recent Labs  04/14/13 0606 04/15/13 0234  WBC 8.4 9.6  HGB 10.6* 9.6*  HCT 34.0* 30.3*  MCV 87.9 87.8  PLT 233 247   No results found for this basename: CKTOTAL, CKMB, CKMBINDEX, TROPONINI,  in the last 72 hours No results found for this basename: TSH, T4TOTAL, FREET3, T3FREE, THYROIDAB,  in the last 72 hours No results found for this basename: VITAMINB12, FOLATE, FERRITIN, TIBC, IRON, RETICCTPCT,  in the last 72 hours  Studies/Results: Ct Head Wo Contrast  04/14/2013   CLINICAL DATA:  Altered mental status. History of breast cancer reported.  EXAM: CT HEAD WITHOUT CONTRAST  TECHNIQUE: Contiguous axial images were obtained from the base of the skull through the vertex without contrast.  COMPARISON:  MR HEAD WO/W CM dated 01/25/2012; CT HEAD WO/W CM dated 11/09/2011  FINDINGS: No evidence for acute infarction, hemorrhage, mass lesion, hydrocephalus, or extra-axial fluid. Mild cerebral and cerebellar atrophy. Minimal white matter disease. No osseous lesions.  Chronic left maxillary sinus opacity. No orbital findings. No mastoid fluid. Similar appearance to priors.  IMPRESSION: No acute intracranial abnormality. No evidence for intracranial hemorrhage, mass lesion or significant change from priors. Chronic left maxillary sinus opacity.   Electronically Signed   By: Rolla Flatten M.D.   On: 04/14/2013 13:04     Medications:  Scheduled: . albuterol  2.5 mg Nebulization BID  . colestipol  1 g Oral BID  . docusate sodium  100 mg Oral BID  . DULoxetine  60 mg Oral QPC lunch  . ezetimibe-simvastatin  1 tablet Oral QHS  . feeding supplement (ENSURE COMPLETE)  237  mL Oral BID BM  . gabapentin  300 mg Oral TID  . hyoscyamine  0.375 mg Oral Q12H  . levETIRAcetam  500 mg Oral BID  . levofloxacin  750 mg Oral QHS  . levothyroxine  88 mcg Oral QAC breakfast  . multivitamin with minerals  1 tablet Oral Daily  . pantoprazole  40 mg Oral Daily  . predniSONE  10 mg Oral Q breakfast  . warfarin  5 mg Oral ONCE-1800  . Warfarin - Pharmacist Dosing Inpatient   Does not apply q1800   Continuous: . heparin 900 Units/hr (04/14/13 1358)    Assessment/Plan: Principal Problem:   UTI (urinary tract infection) Escherichia coli sensitive  Pneumonia, on Levaquin, temperature curve improving, white blood cell count stable, chest x-ray repeated with no significant change continues on nebulizer treatments, no bronchospasm on exam. History of hypoxia this hospitalization, currently on room air, with reports of no desaturation with ambulation, will be rechecked by staff Active Problems:   Adrenal insufficiency-hemodynamically stable on oral prednisone   History of H. pylori-with PCP to treat on an outpatient basis after acute issues resolved, asymptomatic with respect to abdominal discomfort but complicated by some anorexia thought to be multifactorial continues on proton pump inhibitor DVT-on combination heparin, as well as Coumadin, with thought process to have easy reversibility given history of peptic ulcer disease and bleeding.Pharmacy input appreciated. Depression stable on current medications Failure to thrive, hopefully with treatment so will improve, need to watch out for protein calorie malnutrition Transaminitis we'll recheck in the morning Hypokalemia we'll replete Breast cancer initially diagnosed on the left 16 years ago now on the right 2 years ago, status post surgical correction, radiation treatment and chemotherapy   Altered mental status, not apparent at this time however given intermittent issues over the last several days to be multifactorial secondary  to hospitalization, medications, no focal neurologic deficits that are new, however given initiation of anticoagulation and history of peptic ulcer disease, will check head CT without contrast rule out bleed.Head CT with no acute findings 04/14/13  LOS: 6 days   [name] R 04/15/2013, 11:18 AM

## 2013-04-15 NOTE — Progress Notes (Signed)
ANTICOAGULATION CONSULT NOTE - Follow Up  Pharmacy Consult for Heparin/Warfarin Indication: New DVT  Labs:  Recent Labs  04/12/13 0543 04/13/13 0443 04/14/13 0606 04/14/13 1838 04/15/13 0234  HGB 9.4* 9.4* 10.6*  --  9.6*  HCT 30.5* 29.3* 34.0*  --  30.3*  PLT 246 254 233  --  247  LABPROT 14.2 15.2 18.6*  --   --   INR 1.12 1.23 1.60*  --   --   HEPARINUNFRC 0.49 0.47 1.03* 0.39 0.35  CREATININE 0.73 0.83  --   --   --    Infusions:  . heparin 900 Units/hr (04/14/13 1358)    Assessment: Patient on day #5 IV heparin for DVT.  Noted history of recent GIB.    Heparin level again therapeutic at 0.35 with heparin infusion at 900 units/hr.  No bleeding/complications reported.   INR still in process  Goal of Therapy:  Heparin level 0.3-0.7 units/ml Monitor platelets by anticoagulation protocol: Yes   Plan:   Continue heparin infusion at 900 units/hr.  Follow daily heparin level/CBC  F/U INR once resulted and will order warfarin at that time for today  Leone Haven, PharmD  04/15/2013 3:23 AM

## 2013-04-16 LAB — CBC
HCT: 30.8 % — ABNORMAL LOW (ref 36.0–46.0)
Hemoglobin: 9.6 g/dL — ABNORMAL LOW (ref 12.0–15.0)
MCH: 27.6 pg (ref 26.0–34.0)
MCHC: 31.2 g/dL (ref 30.0–36.0)
MCV: 88.5 fL (ref 78.0–100.0)
PLATELETS: 212 10*3/uL (ref 150–400)
RBC: 3.48 MIL/uL — ABNORMAL LOW (ref 3.87–5.11)
RDW: 15.9 % — ABNORMAL HIGH (ref 11.5–15.5)
WBC: 7.7 10*3/uL (ref 4.0–10.5)

## 2013-04-16 LAB — COMPREHENSIVE METABOLIC PANEL
ALK PHOS: 78 U/L (ref 39–117)
ALT: 28 U/L (ref 0–35)
AST: 22 U/L (ref 0–37)
Albumin: 2.2 g/dL — ABNORMAL LOW (ref 3.5–5.2)
BUN: 11 mg/dL (ref 6–23)
CALCIUM: 8 mg/dL — AB (ref 8.4–10.5)
CO2: 25 mEq/L (ref 19–32)
Chloride: 107 mEq/L (ref 96–112)
Creatinine, Ser: 0.91 mg/dL (ref 0.50–1.10)
GFR calc non Af Amer: 59 mL/min — ABNORMAL LOW (ref >90)
GFR, EST AFRICAN AMERICAN: 69 mL/min — AB (ref >90)
GLUCOSE: 95 mg/dL (ref 70–99)
POTASSIUM: 4 meq/L (ref 3.7–5.3)
Sodium: 142 mEq/L (ref 137–147)
Total Bilirubin: <0.2 mg/dL — ABNORMAL LOW (ref 0.3–1.2)
Total Protein: 5.2 g/dL — ABNORMAL LOW (ref 6.0–8.3)

## 2013-04-16 LAB — HEPARIN LEVEL (UNFRACTIONATED): Heparin Unfractionated: 0.35 IU/mL (ref 0.30–0.70)

## 2013-04-16 LAB — PROTIME-INR
INR: 1.84 — ABNORMAL HIGH (ref 0.00–1.49)
PROTHROMBIN TIME: 20.7 s — AB (ref 11.6–15.2)

## 2013-04-16 MED ORDER — ALBUTEROL SULFATE (2.5 MG/3ML) 0.083% IN NEBU
2.5000 mg | INHALATION_SOLUTION | Freq: Three times a day (TID) | RESPIRATORY_TRACT | Status: DC | PRN
  Administered 2013-04-17: 2.5 mg via RESPIRATORY_TRACT
  Filled 2013-04-16: qty 3

## 2013-04-16 MED ORDER — WARFARIN SODIUM 7.5 MG PO TABS
7.5000 mg | ORAL_TABLET | Freq: Once | ORAL | Status: AC
  Administered 2013-04-16: 7.5 mg via ORAL
  Filled 2013-04-16: qty 1

## 2013-04-16 MED ORDER — FUROSEMIDE 40 MG PO TABS
40.0000 mg | ORAL_TABLET | Freq: Once | ORAL | Status: AC
  Administered 2013-04-16: 40 mg via ORAL
  Filled 2013-04-16: qty 1

## 2013-04-16 NOTE — Progress Notes (Signed)
CSW met with patient at RN request. Patient is alert and oriented X3. Patient is now agreeable to snf. Patient states that her ultimate goal is to walk again and at home she just sits and is now realizing that this may be what is contributing to her illness. Patient and spouse state that they do not want patient in [facility] but have heard good things about [facility]. They are agreeable to being faxed out and receiving bed offers.  [name] C. [place] MSW, [place]

## 2013-04-16 NOTE — Progress Notes (Signed)
ANTICOAGULATION CONSULT NOTE - Follow Up Consult  Pharmacy Consult for IV heparin, wafarin Indication: DVT  Allergies  Allergen Reactions  . Penicillins Anaphylaxis  . Adhesive [Tape] Other (See Comments)    Blisters   . Doxycycline Nausea And Vomiting  . Morphine And Related Other (See Comments)    HALLUCINATIONS    Patient Measurements: Height: 5' 6.5" (168.9 cm) Weight: 173 lb 1 oz (78.5 kg) IBW/kg (Calculated) : 60.45 Heparin Dosing Weight: 74.3  Labs:  Recent Labs  04/14/13 0606 04/14/13 1838 04/15/13 0234 04/16/13 0410  HGB 10.6*  --  9.6* 9.6*  HCT 34.0*  --  30.3* 30.8*  PLT 233  --  247 212  LABPROT 18.6*  --  20.5* 20.7*  INR 1.60*  --  1.82* 1.84*  HEPARINUNFRC 1.03* 0.39 0.35 0.35  CREATININE  --   --  0.80 0.91    Assessment: 79 yoF on day # 6 of IV heparin and warfarin bridge for new DVT this admission. Coumadin score = 3.   INR subtherapeutic at 1.84, HL 0.35 (therapeutic). Drug-drug interaction noted with Levaquin but to stop today per MD notes (stop time entered for MD). CBC stable, no bleeding. Not ready for discharge given edema  Goal of Therapy:  INR 2-3 Heparin level 0.3-0.7 units/ml Monitor platelets by anticoagulation protocol: Yes   Plan:   Continue IV heparin at 900 units/hr   Will increase warfarin to 7.5mg  po x 1 tonight to boost INR - will reduce subsequent doses  Daily PT/INR, CBC, Heparin level  Pharmacy will f/u  Vanessa Smithland, PharmD, BCPS Pager: [phone] 10:55 AM Pharmacy #: [id number]

## 2013-04-16 NOTE — Progress Notes (Signed)
PatientTIMIYA, [name]   Account Number:  [id number]  Date Initiated:  04/13/2013  Documentation initiated by:  [facility]  Subjective/Objective Assessment:   78 [place].HX:DVT,PE,BREAST CA.     Action/Plan:   FROM HOME.HAS PCP,PHARMACY.   Anticipated DC Date:  04/17/2013   Anticipated DC Plan:  [place]  CM consult      Choice offered to / List presented to:  C-1 Patient           Status of service:  In process, will continue to follow Medicare Important Message given?  NA - LOS <3 / Initial given by admissions (If response is "NO", the following Medicare IM given date fields will be blank) Date Medicare IM given:   Date Additional Medicare IM given:  04/16/2013  Discharge Disposition:    Per UR Regulation:  Reviewed for med. necessity/level of care/duration of stay  If discussed at [place] of Stay Meetings, dates discussed:    Comments:  04/16/13 Kelsey Jent RN,BSN NCM [phone] ACUTE LLE  DVT-INR SUB THERAPEUTIC-1.84-HEPARIN GTT 9ML/HR,COUMADIN BRIDGE.PT-SNF.D/C PLAN CHANGED FROM HOME TO SNF.  04/13/13 Kelsey Barrantes RN,BSN NCM [phone] PATIENT DECLINES SNF.[facility] CHOSEN FOR HH.RECOMMEND HHRN/PT/OT/AIDE.TC [facility] REP KRISTEN AWARE OF REFERRAL.AWAIT FINAL HHC ORDERS.

## 2013-04-16 NOTE — Progress Notes (Signed)
Physical Therapy Treatment Patient Details Name: [name] MRN: [id number] DOB: 08/13/1934 Today's Date: 04/16/2013 Time: 1140-1209 PT Time Calculation (min): 29 min  PT Assessment / Plan / Recommendation  History of Present Illness 79 YO WF with recent GI bleed, adrenal insufficiency, adult FTT. Was in under my care in [place]. Not seen since then. Pt reports episodes of N/V and some non bloody diarrhea. Has gotten weaker and was brought the ER. Unable to ambulate in the ER. Has pain "all over". Some nausea this AM. Back pain is at baseline. Has been taking prednisone. Notes LLE edema since "the first of the year". Breathing is fair with little cough. Dx with LLE DVT.   PT Comments   Pt c/o increased [pain  Of back, has been medicated. Pt will benefit from [place] rehab at SNF.  Follow Up Recommendations  SNF     Does the patient have the potential to tolerate intense rehabilitation     Barriers to Discharge        Equipment Recommendations  None recommended by PT    Recommendations for Other Services    Frequency Min 3X/week   Progress towards PT Goals Progress towards PT goals: Progressing toward goals  Plan Discharge plan needs to be updated    Precautions / Restrictions Precautions Precautions: Fall Precaution Comments: monitor sats   Pertinent Vitals/Pain 8 back, repositioned.    Mobility  Bed Mobility Bed Mobility: Supine to Sit Supine to sit: Mod assist;HOB elevated General bed mobility comments: pt needed support to get  trunk into upright. Car was taken as pt had a skin tear on L UE earlier today by pulling up on arm. Transfers Equipment used: Rolling walker (2 wheeled) Transfers: Sit to/from Stand Sit to Stand: From elevated surface;Mod assist Stand pivot transfers: Min assist General transfer comment: increased time with no posterior LOB and good use of hands to steady self. Ambulation/Gait Ambulation/Gait assistance: Min assist Ambulation Distance (Feet): 10  Feet Gait Pattern/deviations: Step-to pattern Gait velocity: decreased General Gait Details: pt in more pain today in back and limited in ambulation    Exercises     PT Diagnosis:    PT Problem List:   PT Treatment Interventions:     PT Goals (current goals can now be found in the care plan section)    Visit Information  Last PT Received On: 04/16/13 Assistance Needed: +2 (for safety to ambulate.) History of Present Illness: 79 YO WF with recent GI bleed, adrenal insufficiency, adult FTT. Was in under my care in [place]. Not seen since then. Pt reports episodes of N/V and some non bloody diarrhea. Has gotten weaker and was brought the ER. Unable to ambulate in the ER. Has pain "all over". Some nausea this AM. Back pain is at baseline. Has been taking prednisone. Notes LLE edema since "the first of the year". Breathing is fair with little cough. Dx with LLE DVT.    Subjective Data      Cognition  Cognition Arousal/Alertness: Awake/alert Behavior During Therapy: WFL for tasks assessed/performed Overall Cognitive Status: Within Functional Limits for tasks assessed    Balance     End of Session PT - End of Session Equipment Utilized During Treatment: Gait belt Activity Tolerance: Patient limited by fatigue;Patient limited by pain Patient left: in chair;with call bell/phone within reach;with family/visitor present Nurse Communication: Mobility status   GP     Claretha Cooper 04/16/2013, 2:01 PM Tresa Endo PT [phone]

## 2013-04-16 NOTE — Progress Notes (Signed)
Subjective: More edema noted. Still easily breathless with some desats. No CP. No abd pain or vomiting   Objective: Vital signs in last 24 hours: Temp:  [97.4 F (36.3 C)-98.5 F (36.9 C)] 98.5 F (36.9 C) (02/16 0447) Pulse Rate:  [85-90] 85 (02/16 0447) Resp:  [18] 18 (02/16 0447) BP: (109-151)/(60-66) 151/60 mmHg (02/16 0447) SpO2:  [91 %-96 %] 94 % (02/16 0447) Weight:  [78.5 kg (173 lb 1 oz)] 78.5 kg (173 lb 1 oz) (02/16 0447)  Intake/Output from previous day: 02/15 0701 - 02/16 0700 In: 480 [P.O.:480] Out: -  Intake/Output this shift:    Nontoxic, aniciteric, some wheeze, ht regular. abd some distention. Awake, in good spirits  Lab Results   Recent Labs  04/15/13 0234 04/16/13 0410  WBC 9.6 7.7  RBC 3.45* 3.48*  HGB 9.6* 9.6*  HCT 30.3* 30.8*  MCV 87.8 88.5  MCH 27.8 27.6  RDW 15.5 15.9*  PLT 247 212    Recent Labs  04/15/13 0234 04/16/13 0410  NA 139 142  K 3.3* 4.0  CL 104 107  CO2 23 25  GLUCOSE 100* 95  BUN 11 11  CREATININE 0.80 0.91  CALCIUM 8.2* 8.0*  Results for [name] (MRN [id number]) as of 04/16/2013 08:44  Ref. Range 04/16/2013 04:10  Heparin Unfractionated Latest Range: 0.30-0.70 IU/mL 0.35  Prothrombin Time Latest Range: 11.6-15.2 seconds 20.7 (H)  INR Latest Range: 0.00-1.49  1.84 (H)    Studies/Results: Ct Head Wo Contrast  04/14/2013   CLINICAL DATA:  Altered mental status. History of breast cancer reported.  EXAM: CT HEAD WITHOUT CONTRAST  TECHNIQUE: Contiguous axial images were obtained from the base of the skull through the vertex without contrast.  COMPARISON:  MR HEAD WO/W CM dated 01/25/2012; CT HEAD WO/W CM dated 11/09/2011  FINDINGS: No evidence for acute infarction, hemorrhage, mass lesion, hydrocephalus, or extra-axial fluid. Mild cerebral and cerebellar atrophy. Minimal white matter disease. No osseous lesions.  Chronic left maxillary sinus opacity. No orbital findings. No mastoid fluid. Similar appearance to  priors.  IMPRESSION: No acute intracranial abnormality. No evidence for intracranial hemorrhage, mass lesion or significant change from priors. Chronic left maxillary sinus opacity.   Electronically Signed   By: Rolla Flatten M.D.   On: 04/14/2013 13:04    Scheduled Meds: . albuterol  2.5 mg Nebulization BID  . colestipol  1 g Oral BID  . docusate sodium  100 mg Oral BID  . DULoxetine  60 mg Oral QPC lunch  . ezetimibe-simvastatin  1 tablet Oral QHS  . feeding supplement (ENSURE COMPLETE)  237 mL Oral BID BM  . furosemide  40 mg Oral Once  . gabapentin  300 mg Oral TID  . hyoscyamine  0.375 mg Oral Q12H  . levETIRAcetam  500 mg Oral BID  . levofloxacin  750 mg Oral QHS  . levothyroxine  88 mcg Oral QAC breakfast  . multivitamin with minerals  1 tablet Oral Daily  . pantoprazole  40 mg Oral Daily  . predniSONE  10 mg Oral Q breakfast  . Warfarin - Pharmacist Dosing Inpatient   Does not apply q1800   Continuous Infusions: . heparin 900 Units/hr (04/15/13 1745)   PRN Meds:acetaminophen, acetaminophen, alum & mag hydroxide-simeth, HYDROmorphone (DILAUDID) injection, ipratropium-albuterol, LORazepam, ondansetron (ZOFRAN) IV, ondansetron, polyethylene glycol, vitamin A & D, zolpidem  Assessment/Plan:  PNEUMONIA : still doing fair. Finish levaquin today  E COLI UTI: sensitive to levaquin , finish today ACUTE LLE DVT: INR still  sub-therapeutic EDEMA: add some lasix ADRENAL INSUFFIC: change to PO did fine  ELECTROLYTES:resolved hyponatremia at 139  N/V: improved  CHRONIC H PYLORI: has not been treated. GI Aware. Will treat post acute illness  HYPOTHYROID: on Rx  SEIZURES :on Rx  DEPRESSION: fair  FTT: try to reverse  BREAST CA : stable  OSTEOPOROSIS WITH COMPRESSION FX: pain is under control   LOS: 7 days   [name] [name] 04/16/2013, 8:45 AM

## 2013-04-17 ENCOUNTER — Inpatient Hospital Stay (HOSPITAL_COMMUNITY): Payer: Medicare Other

## 2013-04-17 DIAGNOSIS — B999 Unspecified infectious disease: Secondary | ICD-10-CM | POA: Diagnosis not present

## 2013-04-17 DIAGNOSIS — E2749 Other adrenocortical insufficiency: Secondary | ICD-10-CM | POA: Diagnosis not present

## 2013-04-17 DIAGNOSIS — E871 Hypo-osmolality and hyponatremia: Secondary | ICD-10-CM | POA: Diagnosis not present

## 2013-04-17 DIAGNOSIS — R262 Difficulty in walking, not elsewhere classified: Secondary | ICD-10-CM | POA: Diagnosis not present

## 2013-04-17 DIAGNOSIS — R112 Nausea with vomiting, unspecified: Secondary | ICD-10-CM | POA: Diagnosis not present

## 2013-04-17 DIAGNOSIS — E876 Hypokalemia: Secondary | ICD-10-CM | POA: Diagnosis not present

## 2013-04-17 DIAGNOSIS — K589 Irritable bowel syndrome without diarrhea: Secondary | ICD-10-CM | POA: Diagnosis not present

## 2013-04-17 DIAGNOSIS — K2960 Other gastritis without bleeding: Secondary | ICD-10-CM | POA: Diagnosis not present

## 2013-04-17 DIAGNOSIS — E039 Hypothyroidism, unspecified: Secondary | ICD-10-CM | POA: Diagnosis not present

## 2013-04-17 DIAGNOSIS — J189 Pneumonia, unspecified organism: Secondary | ICD-10-CM | POA: Diagnosis not present

## 2013-04-17 DIAGNOSIS — N390 Urinary tract infection, site not specified: Secondary | ICD-10-CM | POA: Diagnosis not present

## 2013-04-17 DIAGNOSIS — E785 Hyperlipidemia, unspecified: Secondary | ICD-10-CM | POA: Diagnosis not present

## 2013-04-17 DIAGNOSIS — E46 Unspecified protein-calorie malnutrition: Secondary | ICD-10-CM | POA: Diagnosis not present

## 2013-04-17 DIAGNOSIS — A048 Other specified bacterial intestinal infections: Secondary | ICD-10-CM | POA: Diagnosis not present

## 2013-04-17 DIAGNOSIS — M810 Age-related osteoporosis without current pathological fracture: Secondary | ICD-10-CM | POA: Diagnosis not present

## 2013-04-17 DIAGNOSIS — J90 Pleural effusion, not elsewhere classified: Secondary | ICD-10-CM | POA: Diagnosis not present

## 2013-04-17 DIAGNOSIS — F329 Major depressive disorder, single episode, unspecified: Secondary | ICD-10-CM | POA: Diagnosis not present

## 2013-04-17 DIAGNOSIS — M6281 Muscle weakness (generalized): Secondary | ICD-10-CM | POA: Diagnosis not present

## 2013-04-17 DIAGNOSIS — E43 Unspecified severe protein-calorie malnutrition: Secondary | ICD-10-CM | POA: Diagnosis not present

## 2013-04-17 DIAGNOSIS — K922 Gastrointestinal hemorrhage, unspecified: Secondary | ICD-10-CM | POA: Diagnosis not present

## 2013-04-17 DIAGNOSIS — R627 Adult failure to thrive: Secondary | ICD-10-CM | POA: Diagnosis not present

## 2013-04-17 DIAGNOSIS — I82409 Acute embolism and thrombosis of unspecified deep veins of unspecified lower extremity: Secondary | ICD-10-CM | POA: Diagnosis not present

## 2013-04-17 DIAGNOSIS — R279 Unspecified lack of coordination: Secondary | ICD-10-CM | POA: Diagnosis not present

## 2013-04-17 DIAGNOSIS — M2550 Pain in unspecified joint: Secondary | ICD-10-CM | POA: Diagnosis not present

## 2013-04-17 DIAGNOSIS — F3289 Other specified depressive episodes: Secondary | ICD-10-CM | POA: Diagnosis not present

## 2013-04-17 DIAGNOSIS — K219 Gastro-esophageal reflux disease without esophagitis: Secondary | ICD-10-CM | POA: Diagnosis not present

## 2013-04-17 DIAGNOSIS — R569 Unspecified convulsions: Secondary | ICD-10-CM | POA: Diagnosis not present

## 2013-04-17 LAB — CBC
HCT: 30.3 % — ABNORMAL LOW (ref 36.0–46.0)
Hemoglobin: 9.8 g/dL — ABNORMAL LOW (ref 12.0–15.0)
MCH: 28.4 pg (ref 26.0–34.0)
MCHC: 32.3 g/dL (ref 30.0–36.0)
MCV: 87.8 fL (ref 78.0–100.0)
Platelets: 209 10*3/uL (ref 150–400)
RBC: 3.45 MIL/uL — ABNORMAL LOW (ref 3.87–5.11)
RDW: 16.3 % — AB (ref 11.5–15.5)
WBC: 8.6 10*3/uL (ref 4.0–10.5)

## 2013-04-17 LAB — COMPREHENSIVE METABOLIC PANEL
ALBUMIN: 2.2 g/dL — AB (ref 3.5–5.2)
ALT: 24 U/L (ref 0–35)
AST: 20 U/L (ref 0–37)
Alkaline Phosphatase: 77 U/L (ref 39–117)
BUN: 12 mg/dL (ref 6–23)
CALCIUM: 7.9 mg/dL — AB (ref 8.4–10.5)
CO2: 25 mEq/L (ref 19–32)
CREATININE: 0.97 mg/dL (ref 0.50–1.10)
Chloride: 102 mEq/L (ref 96–112)
GFR calc Af Amer: 64 mL/min — ABNORMAL LOW (ref >90)
GFR calc non Af Amer: 55 mL/min — ABNORMAL LOW (ref >90)
Glucose, Bld: 94 mg/dL (ref 70–99)
Potassium: 3.6 mEq/L — ABNORMAL LOW (ref 3.7–5.3)
Sodium: 139 mEq/L (ref 137–147)
TOTAL PROTEIN: 5 g/dL — AB (ref 6.0–8.3)
Total Bilirubin: <0.2 mg/dL — ABNORMAL LOW (ref 0.3–1.2)

## 2013-04-17 LAB — HEPARIN LEVEL (UNFRACTIONATED): HEPARIN UNFRACTIONATED: 0.53 [IU]/mL (ref 0.30–0.70)

## 2013-04-17 LAB — PROTIME-INR
INR: 1.92 — AB (ref 0.00–1.49)
PROTHROMBIN TIME: 21.4 s — AB (ref 11.6–15.2)

## 2013-04-17 MED ORDER — ENSURE COMPLETE PO LIQD: 237.0000 mL | Freq: Two times a day (BID) | ORAL | Status: DC

## 2013-04-17 MED ORDER — HYDROMORPHONE HCL 2 MG PO TABS
2.0000 mg | ORAL_TABLET | Freq: Once | ORAL | Status: AC
  Administered 2013-04-17: 2 mg via ORAL
  Filled 2013-04-17: qty 1

## 2013-04-17 MED ORDER — PREDNISONE 10 MG PO TABS: 10.0000 mg | ORAL_TABLET | Freq: Every day | ORAL | Status: DC

## 2013-04-17 MED ORDER — POTASSIUM CHLORIDE CRYS ER 20 MEQ PO TBCR
40.0000 meq | EXTENDED_RELEASE_TABLET | Freq: Every day | ORAL | Status: DC
  Administered 2013-04-17: 40 meq via ORAL
  Filled 2013-04-17: qty 2

## 2013-04-17 MED ORDER — WARFARIN SODIUM 7.5 MG PO TABS: 7.5000 mg | ORAL_TABLET | Freq: Every day | ORAL | Status: DC

## 2013-04-17 MED ORDER — ENOXAPARIN SODIUM 120 MG/0.8ML ~~LOC~~ SOLN: 120.0000 mg | SUBCUTANEOUS | Status: DC

## 2013-04-17 MED ORDER — MENTHOL 3 MG MT LOZG: 1.0000 | LOZENGE | OROMUCOSAL | Status: DC | PRN

## 2013-04-17 MED ORDER — POLYETHYLENE GLYCOL 3350 17 G PO PACK: 17.0000 g | PACK | Freq: Every day | ORAL | Status: DC | PRN

## 2013-04-17 MED ORDER — MENTHOL 3 MG MT LOZG
1.0000 | LOZENGE | OROMUCOSAL | Status: DC | PRN
  Administered 2013-04-17: 3 mg via ORAL
  Filled 2013-04-17: qty 9

## 2013-04-17 MED ORDER — ENOXAPARIN SODIUM 120 MG/0.8ML ~~LOC~~ SOLN
120.0000 mg | SUBCUTANEOUS | Status: DC
  Administered 2013-04-17: 120 mg via SUBCUTANEOUS
  Filled 2013-04-17: qty 0.8

## 2013-04-17 MED ORDER — WARFARIN SODIUM 7.5 MG PO TABS
7.5000 mg | ORAL_TABLET | Freq: Every day | ORAL | Status: DC
Start: 2013-04-17 — End: 2013-04-17
  Filled 2013-04-17: qty 1

## 2013-04-17 MED ORDER — ALBUTEROL SULFATE (2.5 MG/3ML) 0.083% IN NEBU
2.5000 mg | INHALATION_SOLUTION | Freq: Three times a day (TID) | RESPIRATORY_TRACT | Status: DC | PRN
Start: 2013-04-17 — End: 2013-08-22

## 2013-04-17 MED ORDER — POTASSIUM CHLORIDE CRYS ER 20 MEQ PO TBCR
40.0000 meq | EXTENDED_RELEASE_TABLET | Freq: Every day | ORAL | Status: DC
Start: 2013-04-17 — End: 2013-08-21

## 2013-04-17 MED ORDER — DSS 100 MG PO CAPS: 100.0000 mg | ORAL_CAPSULE | Freq: Two times a day (BID) | ORAL | Status: DC

## 2013-04-17 NOTE — Progress Notes (Signed)
Report called to [facility], spoke with Kathlee Nations.  She confirmed they are ready to accept pt.  Pt dressed and ready for discharge.  Awaiting nonemergency transport.  Coolidge Breeze, RN 04/17/2013

## 2013-04-17 NOTE — Progress Notes (Signed)
Patient is set to discharge to [facility] today. Patient & husband at bedside aware. Discharge packet in [place] aware. [facility] called for transport to pickup @ 3:30pm.   Clinical Social Work Department CLINICAL SOCIAL WORK PLACEMENT NOTE 04/17/2013  Patient:  [name], [name]  Account Number:  [id number] Admit date:  04/09/2013  Clinical Social Worker:  Caren Hazy, LCSW  Date/time:  04/17/2013 02:38 PM  Clinical Social Work is seeking post-discharge placement for this patient at the following level of care:   [place]   (*CSW will update this form in Epic as items are completed)   04/16/2013  Patient/family provided with [place] Department of Clinical Social Work's list of facilities offering this level of care within the geographic area requested by the patient (or if unable, by the patient's family).  04/16/2013  Patient/family informed of their freedom to choose among providers that offer the needed level of care, that participate in Medicare, Medicaid or managed care program needed by the patient, have an available bed and are willing to accept the patient.  04/16/2013  Patient/family informed of [facility]' ownership interest in [facility], as well as of the fact that they are under no obligation to receive care at this facility.  PASARR submitted to EDS on 04/16/2013 PASARR number received from EDS on 04/16/2013  FL2 transmitted to all facilities in geographic area requested by pt/family on  04/16/2013 FL2 transmitted to all facilities within larger geographic area on   Patient informed that his/her managed care company has contracts with or will negotiate with  certain facilities, including the following:     Patient/family informed of bed offers received:  04/16/2013 Patient chooses bed at [facility] Physician recommends and patient chooses bed at    Patient to be transferred to [place] on  04/17/2013 Patient to be  transferred to facility by [facility]  The following physician request were entered in Epic:   Additional Comments:   Raynaldo Opitz, [place] Worker cell #: [phone]

## 2013-04-17 NOTE — Discharge Summary (Signed)
[name]  MR#: [id number]  DOB:10-Feb-1935  Date of Admission: 04/09/2013 Date of Discharge: 04/17/2013  Attending Physician:Karizma Cheek [name]  Patient's RE:[id number] Kelsey Haste, MD  Consults: Pharmacy  Discharge Diagnoses: Principal Problem: Right middle and lower lobe bacterial community acquired pneumonia   UTI (urinary tract infection), E Coli, treated Active Problems: Acute LLE DVT   Adrenal insufficiency, requiring chronic steroids   Irritable bowel syndrome   Hyponatremia, resolved   Hypokalemia, resolved   Leg edema, left   Nausea with vomiting, improved   Adult failure to thrive   Protein-calorie malnutrition, severe Chronic H Pylori, treatment required when well enough to take Rx course Alerted mental status, improved Osteoporosis with recent compression fracture Depression, on Rx Hyperlipidemiea Seizure disorder, on Rx Hypothyroidism, on Rx DM neuropathy Breast CA with PET done in 2011  Discharge Medications:   Medication List         albuterol (2.5 MG/3ML) 0.083% nebulizer solution  Commonly known as:  PROVENTIL  Take 3 mLs (2.5 mg total) by nebulization 3 (three) times daily as needed for wheezing or shortness of breath.     DEXILANT 60 MG capsule  Generic drug:  dexlansoprazole  Take 60 mg by mouth 2 (two) times daily.     DSS 100 MG Caps  Take 100 mg by mouth 2 (two) times daily.     DULoxetine 60 MG capsule  Commonly known as:  CYMBALTA  Take 60 mg by mouth daily after lunch.     enoxaparin 120 MG/0.8ML injection  Commonly known as:  LOVENOX  Inject 0.8 mLs (120 mg total) into the skin daily.     ergocalciferol 50000 UNITS capsule  Commonly known as:  VITAMIN D2  Take 50,000 Units by mouth once a week. tuesday     ezetimibe-simvastatin 10-80 MG per tablet  Commonly known as:  VYTORIN  Take 1 tablet by mouth at bedtime.     feeding supplement (ENSURE COMPLETE) Liqd  Take 237 mLs by mouth 2 (two) times  daily between meals.     gabapentin 100 MG capsule  Commonly known as:  NEURONTIN  Take 300 mg by mouth 3 (three) times daily.     HYDROmorphone 2 MG tablet  Commonly known as:  DILAUDID  Take 1 mg by mouth daily.     hyoscyamine 0.375 MG 12 hr tablet  Commonly known as:  LEVBID  Take 1 tablet by mouth every 12 (twelve) hours.     levETIRAcetam 500 MG tablet  Commonly known as:  KEPPRA  Take 1 tablet (500 mg total) by mouth 2 (two) times daily.     levothyroxine 88 MCG tablet  Commonly known as:  SYNTHROID, LEVOTHROID  Take 88 mcg by mouth daily before breakfast.     LORazepam 0.5 MG tablet  Commonly known as:  ATIVAN  Take 1 tablet (0.5 mg total) by mouth every 8 (eight) hours as needed. Anxiety     menthol-cetylpyridinium 3 MG lozenge  Commonly known as:  CEPACOL  Take 1 lozenge (3 mg total) by mouth as needed for sore throat.     MICRONIZED COLESTIPOL HCL 1 G tablet  Generic drug:  colestipol  Take 1 tablet by mouth 2 (two) times daily.     polyethylene glycol packet  Commonly known as:  MIRALAX / GLYCOLAX  Take 17 g by mouth daily as needed for mild constipation.     potassium chloride SA 20 MEQ tablet  Commonly known as:  K-DUR,KLOR-CON  Take  2 tablets (40 mEq total) by mouth daily.     predniSONE 10 MG tablet  Commonly known as:  DELTASONE  Take 1 tablet (10 mg total) by mouth daily with breakfast.     UCERIS 9 MG Tb24  Generic drug:  Budesonide  Take 9 mg by mouth every morning.     warfarin 7.5 MG tablet  Commonly known as:  COUMADIN  Take 1 tablet (7.5 mg total) by mouth daily at 6 PM.        Hospital Procedures: Dg Chest 2 View  04/17/2013   CLINICAL DATA:  Followup pneumonia  EXAM: CHEST  2 VIEW  COMPARISON:  04/12/2013  FINDINGS: Normal heart size. Small hiatal hernia noted. Small bilateral pleural effusions and pulmonary vascular congestion noted. There is stress set there are coarsened interstitial markings identified bilaterally. Residual  patchy opacities are identified within the right midlung and right base.  IMPRESSION: 1. Persistent patchy airspace opacities within the right midlung and right base 2. Small pleural effusions and pulmonary vascular congestion 3. Hiatal hernia   Electronically Signed   By: Kerby Moors M.D.   On: 04/17/2013 10:08   Dg Chest 2 View  04/12/2013   CLINICAL DATA:  History of pneumonia, shortness of breath, former smoker  EXAM: CHEST  2 VIEW  COMPARISON:  DG CHEST 2 VIEW dated 04/10/2013; DG CHEST 2 VIEW dated 03/10/2013; NM PET IMAGE RESTAG (PS) SKULL BASE TO THIGH dated 03/14/2012; DG CHEST 1V PORT dated 04/10/2013; DG ABD ACUTE W/CHEST dated 02/22/2012  FINDINGS: Grossly unchanged cardiac silhouette and mediastinal contours with persistent rightward deviation of the tracheal air column at the level of the thoracic inlet. Retrocardiac opacity is favored to represent a hiatal hernia. There is chronic mild elevation of the right hemidiaphragm. The lungs remain hyperexpanded with flattening of the bilateral hemidiaphragms. No change to minimal increase and small bilateral pleural effusions, right greater than left. Peripheral heterogeneous slightly nodular opacities within in the right mid and upper lung are grossly unchanged. There is grossly unchanged mild diffuse slightly nodular thickening of the pulmonary interstitium. No new focal airspace opacities. No evidence of edema. No pneumothorax. Post left-sided mastectomy. Grossly unchanged bones including mild lower thoracic/upper lumbar compression fracture.  IMPRESSION: 1. No significant interval change in peripheral predominant heterogeneous opacities within the right upper and mid lung - while possibly representing of atelectasis or scar, residual underlying infection is not excluded. A follow-up chest radiograph in 4 to 6 weeks after treatment is recommended to ensure resolution. 2. No change to minimal increase in small bilateral effusions, right greater than left.  No definite evidence of edema. 3. Suspected hiatal hernia.   Electronically Signed   By: Sandi Mariscal M.D.   On: 04/12/2013 14:17   Dg Chest 2 View  04/10/2013   CLINICAL DATA:  Hypoxia.  EXAM: CHEST  2 VIEW  COMPARISON:  Same day.  FINDINGS: Stable cardiomediastinal silhouette. Surgical clips are noted in left axillary region. Continued elevation of right hemidiaphragm is noted. Interstitial densities seen in left lung on prior exam appear improved. Stable density is seen laterally in right upper lobe which may represent pneumonia. No pleural effusion or pneumothorax is noted.  IMPRESSION: Improved left lung densities compared to prior exam. Stable right upper lobe density is noted concerning for pneumonia.   Electronically Signed   By: Sabino Dick M.D.   On: 04/10/2013 10:37   Ct Head Wo Contrast  04/14/2013   CLINICAL DATA:  Altered mental status. History  of breast cancer reported.  EXAM: CT HEAD WITHOUT CONTRAST  TECHNIQUE: Contiguous axial images were obtained from the base of the skull through the vertex without contrast.  COMPARISON:  MR HEAD WO/W CM dated 01/25/2012; CT HEAD WO/W CM dated 11/09/2011  FINDINGS: No evidence for acute infarction, hemorrhage, mass lesion, hydrocephalus, or extra-axial fluid. Mild cerebral and cerebellar atrophy. Minimal white matter disease. No osseous lesions.  Chronic left maxillary sinus opacity. No orbital findings. No mastoid fluid. Similar appearance to priors.  IMPRESSION: No acute intracranial abnormality. No evidence for intracranial hemorrhage, mass lesion or significant change from priors. Chronic left maxillary sinus opacity.   Electronically Signed   By: Rolla Flatten M.D.   On: 04/14/2013 13:04   Dg Chest Port 1 View  04/10/2013   CLINICAL DATA:  Hypoxia.  EXAM: PORTABLE CHEST - 1 VIEW  COMPARISON:  03/10/2013.  FINDINGS: Chronic elevation of the right hemidiaphragm. Right basilar atelectasis. Cardiopericardial silhouette appears within normal limits.  Surgical clips project over the left chest. There is diffusely increased interstitial opacity, favored to represent interstitial pulmonary edema. Atypical infection such as mycoplasma could also produce this appearance.  Compared to the most recent prior, of the right lateral airspace disease appears resolved.  IMPRESSION: Diffuse interstitial opacity, suggesting interstitial pulmonary edema.   Electronically Signed   By: Dereck Ligas M.D.   On: 04/10/2013 00:40    History of Present Illness: Fever and weakness  Hospital Course: 79YO WF presented with fever and weakness. This was chronic with N/V for a couple of weeks prior. Evaluation revealed 3 major issues, right sided CAP, EColi UTI without urosepsis and a new LLE DVT without evidence of PE. She was treated with Levaquin IV and has completed 8 days. The E Coli was sensitive. CXR shows a lagging of clearing of the infiltrates but her fever curve and WBC suggest the pneumonia is improving. She has required nebulizer txs and supplemental O2. Her INR has been slow to get to a therapeutic range. See below for anticoag plan. We are using Coumadin rather than one of the new novel anticoagulants due to her GI bleed just a couple of months ago. This would allow [place] to more easily reverse anticoagulation if bleeding occurs. Her HGB has been stable this hospitalization and no bleeding has been seen Her upper GI issues are better overall. She does however, have a positive path report from [facility] for chronic HPylori, This has not been treated yet but her GI sxs are somewhat better Her adrenal insufficiency is a long term problem and she should remain on at least 5 mg prednisone Original CXR suggested pulmonary edema but ECHO was fine and BNP was not terribly high She is weak and deconditioned. Her pain from a compression fx is under fair control. Plans are for a SNF stay for strengthening  PHARMACY RECOMMENDATIONS ON DAY OF DISCHARGE Prior to discharge, stop IV  heparin and after 1 hour, give Lovenox 120 mg sq q24h. Would recommend warfarin 7.5mg  po x 1 tonight at SNF and check PT/INR in AM to continue warfarin dosing. Per CHEST guidelines, for new DVT/PE, parenteral anticoagulation should be continued as bridge until INR >/= 2 for 48 hours.  WILL NEED REEVAL OF COUMADIN DOSE TOMORROW AND NEED STOP DATE OF LOVENOX CAREFULLY EVALUATED AT SNF  Day of Discharge Exam BP 137/70  Pulse 84  Temp(Src) 98.3 F (36.8 C) (Oral)  Resp 20  Ht 5' 6.5" (1.689 m)  Wt 78.5 kg (173 lb 1  oz)  BMI 27.52 kg/m2  SpO2 94%  Physical Exam: General appearance: non toxic, thinning hair, cushingoid face, anciteric, oral membranes moist  Resp: a bit distant, no wheeze, no accessory ms in use Cardio: regular with sys murmur, occas skips GI: soft, non-tender; bowel sounds normal; no masses,  no organomegaly Extremities: pulses pretty good, bilat 1+ edema Awake,. Alert., mentating well. No tremor  Discharge Labs:  Recent Labs  04/16/13 0410 04/17/13 0425  NA 142 139  K 4.0 3.6*  CL 107 102  CO2 25 25  GLUCOSE 95 94  BUN 11 12  CREATININE 0.91 0.97  CALCIUM 8.0* 7.9*    Recent Labs  04/16/13 0410 04/17/13 0425  AST 22 20  ALT 28 24  ALKPHOS 78 77  BILITOT <0.2* <0.2*  PROT 5.2* 5.0*  ALBUMIN 2.2* 2.2*    Recent Labs  04/16/13 0410 04/17/13 0425  WBC 7.7 8.6  HGB 9.6* 9.8*  HCT 30.8* 30.3*  MCV 88.5 87.8  PLT 212 209  Results for GYSELLE, RENSCHLER (MRN [id number]) as of 04/17/2013 11:30  Ref. Range 04/13/2013 14:15  Sample type No range found ARTERIAL DRAW  O2 Content No range found ROOM AIR  pH, Arterial Latest Range: 7.350-7.450  7.484 (H)  pCO2 arterial Latest Range: 35.0-45.0 mmHg 29.4 (L)  pO2, Arterial Latest Range: 80.0-100.0 mmHg 70.9 (L)  Bicarbonate Latest Range: 20.0-24.0 mEq/L 21.8  TCO2 Latest Range: 0-100 mmol/L 19.9  Acid-base deficit Latest Range: 0.0-2.0 mmol/L 0.6  O2 Saturation No range found 94.6  Patient temperature No  range found 98.6  Collection site No range found LEFT RADIAL  Allens test (pass/fail) Latest Range: PASS  PASS   Lab Results  Component Value Date   INR 1.92* 04/17/2013   INR 1.84* 04/16/2013   INR 1.82* 04/15/2013   ESCHERICHIA COLI   Entry Date    04/12/2013     Component Results    Component    Specimen Description    URINE, CATHETERIZED    Special Requests    NONE    Culture  Setup Time    04/10/2013 08:41 Performed at [place]    >=100,000 COLONIES/ML Performed at [place] Performed at [facility]    Report Status    04/12/2013 FINAL    Organism ID, Bacteria    ESCHERICHIA COLI     Culture & Susceptibility    ESCHERICHIA COLI    Antibiotic Sensitivity Microscan Status    AMPICILLIN Resistant >=32 RESISTANT Final    Method: MIC    CEFAZOLIN Sensitive <=4 SENSITIVE Final    Method: MIC    CEFTRIAXONE Sensitive <=1 SENSITIVE Final    Method: MIC    CIPROFLOXACIN Sensitive <=0.25 SENSITIVE Final    Method: MIC    GENTAMICIN Sensitive <=1 SENSITIVE Final    Method: MIC    LEVOFLOXACIN Sensitive <=0.12 SENSITIVE Final    Method: MIC    NITROFURANTOIN Resistant 128 RESISTANT Final    Method: MIC    PIP/TAZO Sensitive <=4 SENSITIVE Final    Method: MIC    TOBRAMYCIN Sensitive <=1 SENSITIVE Final    Method: MIC    TRIMETH/SULFA Resistant >=320 RESISTANT Final    Method: MIC    Comments ESCHERICHIA COLI (MIC)    ESCHERICHIA COLI    Transthoracic Echocardiography  Patient: [name], [name] MR #: [id number] Study Date: 04/10/2013 Gender: F Age: 53 Height:  168.9cm Weight: 74.4kg BSA: 1.42m^2 Pt. Status: Room: [place] SONOGRAPHER [place], [place], Inpatient cc:  ------------------------------------------------------------ LV EF: 55% - 60%  ------------------------------------------------------------ Indications: Previous study  12/21/2010. Dyspnea 786.09.  ------------------------------------------------------------ History: PMH: Left lower extremity edema. Adult failure to thrive. Obstructive sleep apnea. History of right breast cancer. Osteoporosis. History of vertebral compression fracture. Left breast cancer. Risk factors: Dyslipidemia.  ------------------------------------------------------------ Study Conclusions  - Procedure narrative: Transthoracic echocardiography. Image quality was adequate. The study was technically difficult. - Left ventricle: The cavity size was normal. Wall thickness was normal. Systolic function was normal. The estimated ejection fraction was in the range of 55% to 60%. - Aortic valve: Calcified non coronary cusp - Mitral valve: Calcified annulus. Mildly thickened leaflets . - Left atrium: The atrium was mildly dilated. - Right ventricle: Not well seen may be mildly dilated - Tricuspid valve: Not well seen TR mild in most views but on apical 4 looked moderate in a couple of beats - Pulmonary arteries: PA peak pressure: 68mm Hg (S). Transthoracic echocardiography. M-mode, complete 2D, spectral Doppler, and color Doppler. Height: Height: 168.9cm. Height: 66.5in. Weight: Weight: 74.4kg. Weight: 163.7lb. Body mass index: BMI: 26.1kg/m^2. Body surface area: BSA: 1.82m^2. Blood pressure: 112/68. Patient status: Inpatient. Location: Bedside.  ------------------------------------------------------------  ------------------------------------------------------------ Left ventricle: The cavity size was normal. Wall thickness was normal. Systolic function was normal. The estimated ejection fraction was in the range of 55% to 60%.          Discharge instructions:      Future Appointments Provider Department Dept Phone   06/15/2013 2:30 PM Marcial Pacas, MD [facility] [phone]   06/28/2013 1:30 PM [facility]  [phone]   06/28/2013 2:00 PM Chauncey Cruel, MD [facility] [phone]      Disposition: to SNF  Follow-up Appts: Follow-up with Dr. Forde Dandy at [facility] in 2 weeks after SNF discharge  Call for appointment.  Condition on Discharge:improved  Tests Needing Follow-up: INR TOMORROW WITH ADJUSTMENT OF ANTICOAGULANTS  Signed: Sheela Stack 04/17/2013, 10:59 AM

## 2013-04-17 NOTE — Progress Notes (Addendum)
Pharmacy Consult: Warfarin, IV Heparin Indication: New DVT  48 yoF on day # 7 of IV heparin and warfarin bridge for new DVT this admission. Coumadin score = 3. Pharmacy received phone call from case management confirming patient's discharge plans for today to SNF.  INR 1.92 this AM and heparin level therapeutic at 0.53  Recommendations: Prior to discharge, stop IV heparin and after 1 hour, give Lovenox 120 mg sq q24h. Would recommend warfarin 7.5mg  po x 1 tonight at SNF and check PT/INR in AM to continue warfarin dosing.  Per CHEST guidelines, for new DVT/PE, parenteral anticoagulation should be continued as bridge until INR >/= 2 for 48 hours.   Vanessa Williamson, PharmD, BCPS Pager: [phone] 10:54 AM Pharmacy #: [phone]

## 2013-04-17 NOTE — Progress Notes (Signed)
Subjective: Some sore throat. Breathing is better. Vague stomach discomfort  Objective: Vital signs in last 24 hours: Temp:  [97.7 F (36.5 C)-98.3 F (36.8 C)] 98.3 F (36.8 C) (02/17 0635) Pulse Rate:  [84-93] 84 (02/17 0635) Resp:  [16-20] 20 (02/17 0635) BP: (108-137)/(49-70) 137/70 mmHg (02/17 0635) SpO2:  [93 %-96 %] 93 % (02/17 0635)  Intake/Output from previous day: 02/16 0701 - 02/17 0700 In: 1280 [P.O.:960; I.V.:320] Out: 1800 [Urine:1800] Intake/Output this shift:    Sitting up no distress. Lungs clearer. No wheeze. Awake.alert  Lab Results   Recent Labs  04/16/13 0410 04/17/13 0425  WBC 7.7 8.6  RBC 3.48* 3.45*  HGB 9.6* 9.8*  HCT 30.8* 30.3*  MCV 88.5 87.8  MCH 27.6 28.4  RDW 15.9* 16.3*  PLT 212 209    Recent Labs  04/16/13 0410 04/17/13 0425  NA 142 139  K 4.0 3.6*  CL 107 102  CO2 25 25  GLUCOSE 95 94  BUN 11 12  CREATININE 0.91 0.97  CALCIUM 8.0* 7.9*    Studies/Results: No results found.  Scheduled Meds: . colestipol  1 g Oral BID  . docusate sodium  100 mg Oral BID  . DULoxetine  60 mg Oral QPC lunch  . ezetimibe-simvastatin  1 tablet Oral QHS  . feeding supplement (ENSURE COMPLETE)  237 mL Oral BID BM  . gabapentin  300 mg Oral TID  . hyoscyamine  0.375 mg Oral Q12H  . levETIRAcetam  500 mg Oral BID  . levothyroxine  88 mcg Oral QAC breakfast  . multivitamin with minerals  1 tablet Oral Daily  . pantoprazole  40 mg Oral Daily  . predniSONE  10 mg Oral Q breakfast  . Warfarin - Pharmacist Dosing Inpatient   Does not apply q1800   Continuous Infusions: . heparin 900 Units/hr (04/16/13 2114)   PRN Meds:acetaminophen, acetaminophen, albuterol, alum & mag hydroxide-simeth, HYDROmorphone (DILAUDID) injection, LORazepam, menthol-cetylpyridinium, ondansetron (ZOFRAN) IV, ondansetron, polyethylene glycol, vitamin A & D, zolpidem  Assessment/Plan:  PNEUMONIA : check XR again E COLI UTI: sensitive to levaquin ,  ACUTE LLE DVT:  INR still sub-therapeutic at 1.92 EDEMA: add some lasix  ADRENAL INSUFFIC: change to PO did fine  ELECTROLYTES:resolved hyponatremia at 139  N/V: improved  CHRONIC H PYLORI: has not been treated. GI Aware. Will treat post acute illness  HYPOTHYROID: on Rx  SEIZURES :on Rx  DEPRESSION: fair  FTT: try to reverse  BREAST CA : stable  OSTEOPOROSIS WITH COMPRESSION FX: pain is under control    LOS: 8 days   Myan Suit [name] 04/17/2013, 8:29 AM

## 2013-04-17 NOTE — Progress Notes (Signed)
CARE MANAGEMENT NOTE 04/17/2013  Patient:  [name], [name]   Account Number:  [id number]  Date Initiated:  04/13/2013  Documentation initiated by:  Dessa Phi  Subjective/Objective Assessment:   1 [place].HX:DVT,PE,BREAST CA.     Action/Plan:   FROM HOME.HAS PCP,PHARMACY.   Anticipated DC Date:  04/17/2013   Anticipated DC Plan:  [place]  CM consult      Choice offered to / List presented to:  C-1 Patient           Status of service:  Completed, signed off Medicare Important Message given?  NA - LOS <3 / Initial given by admissions (If response is "NO", the following Medicare IM given date fields will be blank) Date Medicare IM given:   Date Additional Medicare IM given:  04/16/2013  Discharge Disposition:  [place]  Per UR Regulation:  Reviewed for med. necessity/level of care/duration of stay  If discussed at [place] of Stay Meetings, dates discussed:   04/17/2013    Comments:  04/17/13 Dekayla Prestridge RN,BSN NCM [phone] D/C SNF.  04/16/13 Magdalen Cabana RN,BSN NCM [phone] ACUTE LLE  DVT-INR SUB THERAPEUTIC-1.84-HEPARIN GTT 9ML/HR,COUMADIN BRIDGE.PT-SNF.D/C PLAN CHANGED FROM HOME TO SNF.  04/13/13 Tanicka Bisaillon RN,BSN NCM [phone] PATIENT DECLINES SNF.[facility] CHOSEN FOR HH.RECOMMEND HHRN/PT/OT/AIDE.TC [facility] REP KRISTEN AWARE OF REFERRAL.AWAIT FINAL HHC ORDERS.

## 2013-04-18 ENCOUNTER — Non-Acute Institutional Stay (SKILLED_NURSING_FACILITY): Payer: Medicare Other | Admitting: Adult Health

## 2013-04-18 ENCOUNTER — Encounter: Payer: Self-pay | Admitting: Adult Health

## 2013-04-18 DIAGNOSIS — E785 Hyperlipidemia, unspecified: Secondary | ICD-10-CM | POA: Diagnosis not present

## 2013-04-18 DIAGNOSIS — R609 Edema, unspecified: Secondary | ICD-10-CM

## 2013-04-18 DIAGNOSIS — R600 Localized edema: Secondary | ICD-10-CM

## 2013-04-18 DIAGNOSIS — K922 Gastrointestinal hemorrhage, unspecified: Secondary | ICD-10-CM

## 2013-04-18 DIAGNOSIS — K589 Irritable bowel syndrome without diarrhea: Secondary | ICD-10-CM

## 2013-04-18 DIAGNOSIS — E2749 Other adrenocortical insufficiency: Secondary | ICD-10-CM

## 2013-04-18 DIAGNOSIS — F329 Major depressive disorder, single episode, unspecified: Secondary | ICD-10-CM | POA: Insufficient documentation

## 2013-04-18 DIAGNOSIS — E039 Hypothyroidism, unspecified: Secondary | ICD-10-CM

## 2013-04-18 DIAGNOSIS — R569 Unspecified convulsions: Secondary | ICD-10-CM

## 2013-04-18 DIAGNOSIS — G609 Hereditary and idiopathic neuropathy, unspecified: Secondary | ICD-10-CM

## 2013-04-18 DIAGNOSIS — F419 Anxiety disorder, unspecified: Secondary | ICD-10-CM | POA: Insufficient documentation

## 2013-04-18 DIAGNOSIS — F3289 Other specified depressive episodes: Secondary | ICD-10-CM

## 2013-04-18 DIAGNOSIS — G629 Polyneuropathy, unspecified: Secondary | ICD-10-CM

## 2013-04-18 DIAGNOSIS — I82409 Acute embolism and thrombosis of unspecified deep veins of unspecified lower extremity: Secondary | ICD-10-CM | POA: Insufficient documentation

## 2013-04-18 DIAGNOSIS — F32A Depression, unspecified: Secondary | ICD-10-CM

## 2013-04-18 DIAGNOSIS — E2740 Unspecified adrenocortical insufficiency: Secondary | ICD-10-CM

## 2013-04-18 NOTE — Progress Notes (Signed)
Patient ID: [name], female   DOB: [birth year], 79 y.o.   MRN: [id number]               PROGRESS NOTE  DATE: 04/18/2013  FACILITY: Nursing Home Location: [facility]  LEVEL OF CARE: SNF (31)  Acute Visit  CHIEF COMPLAINT:  Follow-up hospitalization  HISTORY OF PRESENT ILLNESS: This is a 79 year old female who has been admitted to [facility] on 04/17/13 from [facility] with principal discharge diagnoses of Community acquired pneumonia and UTI - treated. She has been admitted for a short-term rehabilitation.  REASSESSMENT OF ONGOING PROBLEM(S):  PERIPHERAL NEUROPATHY: The peripheral neuropathy is stable. The patient denies pain in the feet, tingling, and numbness. No complications noted from the medication presently being used.  SEIZURE DISORDER: The patient's seizure disorder remains stable. No complications reported from the medications presently being used. Staff do not report any recent seizure activity.  HYPOTHYROIDISM: The hypothyroidism remains stable. No complications noted from the medications presently being used.  The patient denies fatigue or constipation.  2/15 TSH 1.787   PAST MEDICAL HISTORY : Reviewed.  No changes.  CURRENT MEDICATIONS: Reviewed per [facility]  REVIEW OF SYSTEMS:  GENERAL: no change in appetite, no fatigue, no weight changes, no fever, chills or weakness RESPIRATORY: no cough, SOB, DOE, wheezing, hemoptysis CARDIAC: no chest pain,or palpitations, +edema GI: no abdominal pain, diarrhea, constipation, heart burn, nausea or vomiting  PHYSICAL EXAMINATION  VS:  See VS section  GENERAL: no acute distress, normal body habitus EYES: conjunctivae normal, sclerae normal, normal eye lids NECK: supple, trachea midline, no neck masses, no thyroid tenderness, no thyromegaly LYMPHATICS: no LAN in the neck, no supraclavicular LAN RESPIRATORY: breathing is even & unlabored, BS CTAB CARDIAC: RRR, no murmur,no extra heart sounds,  LLE edema 2+ GI: abdomen soft, normal BS, no masses, no tenderness, no hepatomegaly, no splenomegaly PSYCHIATRIC: the patient is alert & oriented to person, affect & behavior appropriate  LABS/RADIOLOGY: Labs reviewed: Basic Metabolic Panel:  Recent Labs  03/13/13 0352 04/09/13 2220  04/15/13 0234 04/16/13 0410 04/17/13 0425  NA 140  --   < > 139 142 139  K 4.1  --   < > 3.3* 4.0 3.6*  CL 105  --   < > 104 107 102  CO2 23  --   < > 23 25 25   GLUCOSE 98  --   < > 100* 95 94  BUN 19  --   < > 11 11 12   CREATININE 0.84  --   < > 0.80 0.91 0.97  CALCIUM 8.3*  --   < > 8.2* 8.0* 7.9*  MG  --  1.5  --   --   --   --   PHOS  --  3.1  --   --   --   --   < > = values in this interval not displayed. Liver Function Tests:  Recent Labs  04/13/13 0443 04/16/13 0410 04/17/13 0425  AST 127* 22 20  ALT 52* 28 24  ALKPHOS 84 78 77  BILITOT <0.2* <0.2* <0.2*  PROT 5.5* 5.2* 5.0*  ALBUMIN 2.2* 2.2* 2.2*    Recent Labs  03/10/13 1413  LIPASE 17   CBC:  Recent Labs  10/30/12 1745 01/04/13 1330 03/10/13 1413  04/15/13 0234 04/16/13 0410 04/17/13 0425  WBC 9.7 9.2 11.0*  < > 9.6 7.7 8.6  NEUTROABS 7.0 6.5 7.4  --   --   --   --  HGB 14.2 11.8 10.7*  < > 9.6* 9.6* 9.8*  HCT 43.5 36.6 33.7*  < > 30.3* 30.8* 30.3*  MCV 95.8 94.0 93.1  < > 87.8 88.5 87.8  PLT 232 217 220  < > 247 212 209  < > = values in this interval not displayed.  Cardiac Enzymes:  Recent Labs  03/10/13 1413  TROPONINI <0.30     ASSESSMENT/PLAN:  Pneumonia - resolved  UTI - resolved  GI Bleed, history - continue Dexilant  GERD - stable  IBS - stable  Seizure - well-controlled  Hyperlipidemia - continue Vytorin  Neuropathy - stable  Hypothyroidism - well-controlled  Adrenal Insufficiency - stable  Depression - stable  LLE DVT - continue Coumadin      CPT CODE: 01093  Eran Windish Vargas - NP [facility] [phone]

## 2013-04-20 ENCOUNTER — Other Ambulatory Visit: Payer: Self-pay

## 2013-04-20 MED ORDER — HYDROMORPHONE HCL 2 MG PO TABS: ORAL_TABLET | ORAL | Status: DC

## 2013-04-20 NOTE — Telephone Encounter (Signed)
Rx faxed to [facility] @ [phone], phone number [phone]  

## 2013-04-24 ENCOUNTER — Other Ambulatory Visit: Payer: Self-pay

## 2013-04-24 MED ORDER — HYDROMORPHONE HCL 2 MG PO TABS: ORAL_TABLET | ORAL | Status: DC

## 2013-04-24 NOTE — Telephone Encounter (Signed)
Rx faxed to [facility] @ [phone], phone number [phone]  

## 2013-05-01 ENCOUNTER — Encounter: Payer: Self-pay | Admitting: *Deleted

## 2013-05-04 ENCOUNTER — Encounter: Payer: Self-pay | Admitting: Internal Medicine

## 2013-05-04 ENCOUNTER — Non-Acute Institutional Stay (SKILLED_NURSING_FACILITY): Payer: Medicare Other | Admitting: Internal Medicine

## 2013-05-04 DIAGNOSIS — E785 Hyperlipidemia, unspecified: Secondary | ICD-10-CM

## 2013-05-04 DIAGNOSIS — E039 Hypothyroidism, unspecified: Secondary | ICD-10-CM

## 2013-05-04 DIAGNOSIS — N390 Urinary tract infection, site not specified: Secondary | ICD-10-CM | POA: Diagnosis not present

## 2013-05-04 DIAGNOSIS — F32A Depression, unspecified: Secondary | ICD-10-CM

## 2013-05-04 DIAGNOSIS — E876 Hypokalemia: Secondary | ICD-10-CM

## 2013-05-04 DIAGNOSIS — A048 Other specified bacterial intestinal infections: Secondary | ICD-10-CM

## 2013-05-04 DIAGNOSIS — D62 Acute posthemorrhagic anemia: Secondary | ICD-10-CM

## 2013-05-04 DIAGNOSIS — F3289 Other specified depressive episodes: Secondary | ICD-10-CM

## 2013-05-04 DIAGNOSIS — G629 Polyneuropathy, unspecified: Secondary | ICD-10-CM

## 2013-05-04 DIAGNOSIS — J189 Pneumonia, unspecified organism: Secondary | ICD-10-CM | POA: Diagnosis not present

## 2013-05-04 DIAGNOSIS — R569 Unspecified convulsions: Secondary | ICD-10-CM

## 2013-05-04 DIAGNOSIS — K922 Gastrointestinal hemorrhage, unspecified: Secondary | ICD-10-CM

## 2013-05-04 DIAGNOSIS — E2749 Other adrenocortical insufficiency: Secondary | ICD-10-CM

## 2013-05-04 DIAGNOSIS — E2740 Unspecified adrenocortical insufficiency: Secondary | ICD-10-CM

## 2013-05-04 DIAGNOSIS — K589 Irritable bowel syndrome without diarrhea: Secondary | ICD-10-CM

## 2013-05-04 DIAGNOSIS — E871 Hypo-osmolality and hyponatremia: Secondary | ICD-10-CM

## 2013-05-04 DIAGNOSIS — I82409 Acute embolism and thrombosis of unspecified deep veins of unspecified lower extremity: Secondary | ICD-10-CM

## 2013-05-04 DIAGNOSIS — G609 Hereditary and idiopathic neuropathy, unspecified: Secondary | ICD-10-CM

## 2013-05-04 DIAGNOSIS — E43 Unspecified severe protein-calorie malnutrition: Secondary | ICD-10-CM

## 2013-05-04 DIAGNOSIS — M4850XA Collapsed vertebra, not elsewhere classified, site unspecified, initial encounter for fracture: Secondary | ICD-10-CM

## 2013-05-04 DIAGNOSIS — IMO0002 Reserved for concepts with insufficient information to code with codable children: Secondary | ICD-10-CM

## 2013-05-04 DIAGNOSIS — F329 Major depressive disorder, single episode, unspecified: Secondary | ICD-10-CM

## 2013-05-04 NOTE — Assessment & Plan Note (Signed)
Continue Neurontin. 

## 2013-05-04 NOTE — Assessment & Plan Note (Signed)
POSITIVE;from GI work up in [facility] are better but pt has not been well enough to treat yet; I think we should get her stable on coumadin then treat;for now UNTREATED

## 2013-05-04 NOTE — Assessment & Plan Note (Signed)
On budesonide?

## 2013-05-04 NOTE — Assessment & Plan Note (Signed)
Continue Levothyroxine 

## 2013-05-04 NOTE — Assessment & Plan Note (Signed)
Continue K supplement ?

## 2013-05-04 NOTE — Assessment & Plan Note (Signed)
Dicharge Hb is 9.8; will follow

## 2013-05-04 NOTE — Assessment & Plan Note (Signed)
Occurred same time as PNA;treated with IV levaquin used for PNA; resolved

## 2013-05-04 NOTE — Assessment & Plan Note (Signed)
Continue Cymbalta.

## 2013-05-04 NOTE — Assessment & Plan Note (Signed)
Continue Vytorin.

## 2013-05-04 NOTE — Assessment & Plan Note (Signed)
Continue omeprazole 

## 2013-05-04 NOTE — Progress Notes (Signed)
MRN: [id number] Name: MARIEKE Zenia Guest  Sex: female Age: 79 y.o. DOB: 1935-02-27  [place] #: [facility] Facility/Room: 903  1 Level Of Care: SNF Provider: Inocencio Homes D Emergency Contacts: Extended Emergency Contact Information Primary Emergency Contact: Xin,Fred C Address: [place], [place] Phone: [phone] Mobile Phone: [phone] Relation: Spouse Secondary Emergency Contact: Rouse,Chris Address: [address]          [place], [place] [address] [place]net[place] of [place] Phone: [phone] Relation: Daughter  Code Status: DNR  Allergies: Penicillins; Adhesive; Doxycycline; and Morphine and related  Chief Complaint  Patient presents with  . nursing home admission    HPI: Patient is 79 y.o. female who who is admitted for OT/PT after treatment of PNA and UTI in hospital and recent dx of DVT.  Past Medical History  Diagnosis Date  . IBS (irritable bowel syndrome)   . Fibromyalgia   . History of DVT (deep vein thrombosis)   . History of pulmonary embolism   . Osteoporosis   . History of breast cancer LEFT BREAST DCIS  1996  . Hyperlipidemia   . 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 Breast cancer RIGHT SIDE-- DX OCT 2011    CHEMORADIATION THERAPY-- COMPLETED   . PONV (postoperative nausea and vomiting)   . Skin tear LEFT ARM COVERED W/ TEGADERM    PT STATES HX MULTIPLE SKIN TEARS -- THIN  . Thin skin SECONARDY AGE AND HX CHEMO  . Ecchymosis   . Seasonal allergies   . Frequency of urination   . Nocturia   . Anemia   . Lung mass PER DR CLANCE NOTE UNCLEAR IF LYMPHOMA FROM BX    FOLLOWED BY DR RUBIN  . Seizures   . Unspecified hereditary and idiopathic peripheral neuropathy   . Asthma   . Fibromyalgia   . Vertebral compression fracture 5/14    L4    Past Surgical History  Procedure Laterality Date  . Total abdominal hysterectomy w/ bilateral  salpingoophorectomy  1977  (APPROX)  . Cholecystectomy    . Appendectomy    . Femur fracture surgery  12-18-2010  DR BEANE    INTRAMEDULLARY NAILING LEFT INTERTROCHANTERIC -SUBTROCHANTERIC FX  . Right breast lumpectomy / removal lymph nodes x2/ removal pac  10-01-2010    CARCINOMA RIGHT BREAST  . Placement port- a- cath  02-18-2010  . Bronchoscopy  01-29-2010    W/ BX  . Tvt vaginal tape  suburethral sling   06-08-2005    SUI  . Bilateral laminectomy/ foraminotomy  01-14-2004    L4 - L5  . Breast lumpectomy  1996     LEFT BREAST DCIS   . Cataract extraction w/ intraocular lens  implant, bilateral    . Bilateral carpal tunnel release    . Tonsillectomy    . Transthoracic echocardiogram  02-25-2010    LVSF NORMAL/ EF [id number] GRADE I DIASTOLIC DYSFUNCTION/ MILDLY DILATED LEFT ATRIUM  . Hardware removal  10/11/2011    Procedure: HARDWARE REMOVAL;  Surgeon: Johnn Hai, MD;  Location: [facility];  Service: Orthopedics;  Laterality: Left;  LEFT THIGH REMOVAL OF DISTAL LOCKING SCREW NEEDS: FLORO, RADIO LUSCENT TABLE AND SCREWDRIVER FOR BIOMET ROD   . Colonoscopy    . Esophagogastroduodenoscopy N/A 03/11/2013    Procedure: ESOPHAGOGASTRODUODENOSCOPY (EGD);  Surgeon: Lear Ng, MD;  Location: [facility];  Service: Endoscopy;  Laterality: N/A;      Medication List       This list is accurate as of: 05/04/13  8:09 PM.  Always use your most recent med list.               albuterol (2.5 MG/3ML) 0.083% nebulizer solution  Commonly known as:  PROVENTIL  Take 3 mLs (2.5 mg total) by nebulization 3 (three) times daily as needed for wheezing or shortness of breath.     DSS 100 MG Caps  Take 100 mg by mouth 2 (two) times daily.     DULoxetine 60 MG capsule  Commonly known as:  CYMBALTA  Take 60 mg by mouth daily after lunch.     enoxaparin 120 MG/0.8ML injection  Commonly known as:  LOVENOX  Inject 120 mg into the skin daily. For DVT/Prophylaxis      ergocalciferol 50000 UNITS capsule  Commonly known as:  VITAMIN D2  Take 50,000 Units by mouth once a week. tuesday     ezetimibe-simvastatin 10-80 MG per tablet  Commonly known as:  VYTORIN  Take 1 tablet by mouth at bedtime. For hyperlipidemia     feeding supplement (ENSURE COMPLETE) Liqd  Take 237 mLs by mouth 2 (two) times daily between meals.     gabapentin 100 MG capsule  Commonly known as:  NEURONTIN  Take 300 mg by mouth 3 (three) times daily. For neuropathy     HYDROmorphone 2 MG tablet  Commonly known as:  DILAUDID  Take 1 mg by mouth 2 (two) times daily. Take 1/2 tablet= 1 mg by mouth twice daily for pain     hyoscyamine 0.375 MG 12 hr tablet  Commonly known as:  LEVBID  Take 1 tablet by mouth every 12 (twelve) hours. For bowel spasms.     levETIRAcetam 500 MG tablet  Commonly known as:  KEPPRA  Take 500 mg by mouth 2 (two) times daily. For seizures     levothyroxine 88 MCG tablet  Commonly known as:  SYNTHROID, LEVOTHROID  Take 88 mcg by mouth daily before breakfast. For hypothyroid     LORazepam 0.5 MG tablet  Commonly known as:  ATIVAN  Take 1 tablet (0.5 mg total) by mouth every 8 (eight) hours as needed. Anxiety     menthol-cetylpyridinium 3 MG lozenge  Commonly known as:  CEPACOL  Take 1 lozenge (3 mg total) by mouth as needed for sore throat.     MICRONIZED COLESTIPOL HCL 1 G tablet  Generic drug:  colestipol  Take 1 tablet by mouth 2 (two) times daily. For hyperlipidemia     omeprazole 20 MG capsule  Commonly known as:  PRILOSEC  Take 20 mg by mouth daily.     polyethylene glycol packet  Commonly known as:  MIRALAX / GLYCOLAX  Take 17 g by mouth daily as needed for mild constipation.     potassium chloride SA 20 MEQ tablet  Commonly known as:  K-DUR,KLOR-CON  Take 2 tablets (40 mEq total) by mouth daily.     warfarin 7.5 MG tablet  Commonly known as:  COUMADIN  Take 7.5 mg by mouth daily at 6 PM. For DVT        No orders of the defined  types were placed in this encounter.    Immunization History  Administered Date(s) Administered  . Influenza Whole 10/30/2009, 11/30/2011  . PPD Test 04/17/2013  . Pneumococcal Polysaccharide-23 07/27/2012    History  Substance Use Topics  . Smoking status:  Former Smoker -- 1.00 packs/day for 30 years    Types: Cigarettes    Quit date: 03/01/1978  . Smokeless tobacco: Never Used  . Alcohol Use: 4.2 oz/week    7 Glasses of wine per week    Family history is noncontributory    Review of Systems  DATA OBTAINED: from patient GENERAL: Feels well no fevers, fatigue, appetite changes SKIN: No itching, rash or wounds EYES: No eye pain, redness, discharge EARS: No earache, tinnitus, change in hearing NOSE: No congestion, drainage or bleeding  MOUTH/THROAT: No mouth or tooth pain, No sore throat, No difficulty chewing or swallowing  RESPIRATORY: + cough at night, would like something for it; no wheezing, no SOB CARDIAC: No chest pain, palpitations, lower extremity edema  GI: No abdominal pain, hx v/d and constipation,  GU: No dysuria, frequency or urgency, or incontinence  MUSCULOSKELETAL: No unrelieved bone/joint pain NEUROLOGIC: No headache, dizziness or focal weakness PSYCHIATRIC: No overt anxiety or sadness. Sleeps well. No behavior issue.   Filed Vitals:   05/04/13 1928  BP: 119/73  Pulse: 107  Temp: 98.9 F (37.2 C)  Resp: 16    Physical Exam  GENERAL APPEARANCE: Alert, conversant. Appropriately groomed. No acute distress.  SKIN: No diaphoresis rash, or wounds HEAD: Normocephalic, atraumatic  EYES: Conjunctiva/lids clear. Pupils round, reactive. EOMs intact.  EARS: External exam WNL, canals clear. Hearing grossly normal.  NOSE: No deformity or discharge.  MOUTH/THROAT: Lips w/o lesions. RESPIRATORY: Breathing is even, unlabored. Lung sounds are clear   CARDIOVASCULAR: Heart RRR no murmurs, rubs or gallops. trace peripheral edema LLE GASTROINTESTINAL: Abdomen is  soft, non-tender, not distended w/ normal bowel sounds GENITOURINARY: Bladder non tender, not distended  MUSCULOSKELETAL: No abnormal joints or musculature NEUROLOGIC: Oriented X3. Cranial nerves 2-12 grossly intact. Moves all extremities no tremor. PSYCHIATRIC: Mood and affect appropriate to situation, no behavioral issues  Patient Active Problem List   Diagnosis Date Noted  . CAP (community acquired pneumonia) 05/04/2013  . H. pylori infection 05/04/2013  . DVT, LLE 04/18/2013  . Depression 04/18/2013  . Hypothyroidism 04/18/2013  . UTI (urinary tract infection) 04/10/2013  . Hyponatremia 04/10/2013  . Hypokalemia 04/10/2013  . Leg edema, left 04/10/2013  . Nausea with vomiting 04/10/2013  . Adult failure to thrive 04/10/2013  . Protein-calorie malnutrition, severe 04/10/2013  . Renal insufficiency 03/10/2013  . GI bleed 03/10/2013  . UGIB (upper gastrointestinal bleed) 03/10/2013  . Breast cancer of upper-outer quadrant of right female breast 01/04/2013  . Neoplasm of left breast, primary tumor staging category Tis: ductal carcinoma in situ (DCIS) 01/04/2013  . Vertebral compression fracture 10/30/2012  . Adrenal insufficiency 10/30/2012  . Osteoporosis 10/30/2012  . Irritable bowel syndrome 10/30/2012  . Peripheral neuropathy 10/30/2012  . Unsteady gait 10/30/2012  . Recurrent falls 10/30/2012  . Chronic cough 07/18/2012  . Snoring 06/14/2012  . Thin skin   . Ecchymosis   . Seasonal allergies   . Frequency of urination   . Nocturia   . Anemia   . Seizures   . Unspecified hereditary and idiopathic peripheral neuropathy   . Acute blood loss anemia 01/02/2011  . Low serum cortisol level 01/02/2011  . Breast cancer, stage 2 Right 09/07/2010  . HYPERLIPIDEMIA 01/23/2010  . OBSTRUCTIVE SLEEP APNEA 01/23/2010  . MASS, LUNG 01/23/2010    CBC    Component Value Date/Time   WBC 8.6 04/17/2013 0425   WBC 9.2 01/04/2013 1330   RBC 3.45* 04/17/2013 0425   RBC 3.89  01/04/2013 1330  HGB 9.8* 04/17/2013 0425   HGB 11.8 01/04/2013 1330   HCT 30.3* 04/17/2013 0425   HCT 36.6 01/04/2013 1330   PLT 209 04/17/2013 0425   PLT 217 01/04/2013 1330   MCV 87.8 04/17/2013 0425   MCV 94.0 01/04/2013 1330   LYMPHSABS 2.0 03/10/2013 1413   LYMPHSABS 1.9 01/04/2013 1330   MONOABS 1.5* 03/10/2013 1413   MONOABS 0.7 01/04/2013 1330   EOSABS 0.1 03/10/2013 1413   EOSABS 0.1 01/04/2013 1330   BASOSABS 0.0 03/10/2013 1413   BASOSABS 0.1 01/04/2013 1330    CMP     Component Value Date/Time   NA 139 04/17/2013 0425   NA 141 01/04/2013 1330   K 3.6* 04/17/2013 0425   K 3.7 01/04/2013 1330   CL 102 04/17/2013 0425   CL 102 07/04/2012 1456   CO2 25 04/17/2013 0425   CO2 25 01/04/2013 1330   GLUCOSE 94 04/17/2013 0425   GLUCOSE 82 01/04/2013 1330   GLUCOSE 92 07/04/2012 1456   BUN 12 04/17/2013 0425   BUN 18.3 01/04/2013 1330   CREATININE 0.97 04/17/2013 0425   CREATININE 0.8 01/04/2013 1330   CALCIUM 7.9* 04/17/2013 0425   CALCIUM 9.3 01/04/2013 1330   PROT 5.0* 04/17/2013 0425   PROT 6.4 01/04/2013 1330   ALBUMIN 2.2* 04/17/2013 0425   ALBUMIN 2.8* 01/04/2013 1330   AST 20 04/17/2013 0425   AST 17 01/04/2013 1330   ALT 24 04/17/2013 0425   ALT 30 01/04/2013 1330   ALKPHOS 77 04/17/2013 0425   ALKPHOS 139 01/04/2013 1330   BILITOT <0.2* 04/17/2013 0425   BILITOT 0.48 01/04/2013 1330   GFRNONAA 55* 04/17/2013 0425   GFRAA 64* 04/17/2013 0425    Assessment and Plan  CAP (community acquired pneumonia) Treated on hospital with IV levaquin for 8 days; pt is improving except for cough at night. She is already on hefty pain meds so will start prn albuterol neb scheduled qHS and tussin 600 mg qHS.  UTI (urinary tract infection) Occurred same time as PNA;treated with IV levaquin used for PNA; resolved  DVT, LLE Dx at same time as PNA and UTI after LLE swelling since the first of the year; EVEN THOUGH PT HAS HAD RECENT GI BLEED COUMADIN WAS CHOSEN FOR ANTI-COAG BECAUSE IT CAN BE MOST RELIABLY  REVERSED; PT/INR are being monitored  H. pylori infection POSITIVE;from GI work up in [facility] are better but pt has not been well enough to treat yet; I think we should get her stable on coumadin then treat;for now UNTREATED  Adrenal insufficiency CONTINUE PREDNISONE 5 MG DAILY  Vertebral compression fracture Pt has had another recent compression fx and is on pain meds for it  Hyponatremia Chronic for pt;baseline is around 130  Protein-calorie malnutrition, severe Alb 1.9-pt on protein supplements  Peripheral neuropathy Continue Neurontin  Hypothyroidism Continue Levothyroxine  Seizures Continue Keppra  Hypokalemia Continue K+ supplement  Depression Continue Cymbalta  Acute blood loss anemia Dicharge Hb is 9.8; will follow  HYPERLIPIDEMIA Continue Vytorin  UGIB (upper gastrointestinal bleed) Continue omeprazole  Irritable bowel syndrome On budesonide?    Hennie Duos, MD

## 2013-05-04 NOTE — Assessment & Plan Note (Signed)
Dx at same time as PNA and UTI after LLE swelling since the first of the year; EVEN THOUGH PT HAS HAD RECENT GI BLEED COUMADIN WAS CHOSEN FOR ANTI-COAG BECAUSE IT CAN BE MOST RELIABLY REVERSED; PT/INR are being monitored

## 2013-05-04 NOTE — Assessment & Plan Note (Signed)
Continue Keppra.

## 2013-05-04 NOTE — Assessment & Plan Note (Signed)
Alb 1.9-pt on protein supplements

## 2013-05-04 NOTE — Assessment & Plan Note (Signed)
Chronic for pt;baseline is around 130

## 2013-05-04 NOTE — Assessment & Plan Note (Signed)
Treated on hospital with IV levaquin for 8 days; pt is improving except for cough at night. She is already on hefty pain meds so will start prn albuterol neb scheduled qHS and tussin 600 mg qHS.

## 2013-05-04 NOTE — Assessment & Plan Note (Signed)
CONTINUE PREDNISONE 5 MG DAILY

## 2013-05-04 NOTE — Assessment & Plan Note (Signed)
Pt has had another recent compression fx and is on pain meds for it

## 2013-05-22 ENCOUNTER — Telehealth: Payer: Self-pay | Admitting: Neurology

## 2013-05-22 NOTE — Telephone Encounter (Signed)
Calling to state they do not want to reschedule the cancelled up coming appointment due to patient being in rehab right now. They will callback to Reschedule

## 2013-05-25 ENCOUNTER — Other Ambulatory Visit: Payer: Self-pay

## 2013-05-25 MED ORDER — HYDROMORPHONE HCL 2 MG PO TABS: 1.0000 mg | ORAL_TABLET | Freq: Two times a day (BID) | ORAL | Status: DC

## 2013-05-25 NOTE — Telephone Encounter (Signed)
Rx faxed to [facility] @ [phone], phone number [phone]  

## 2013-05-31 ENCOUNTER — Non-Acute Institutional Stay (SKILLED_NURSING_FACILITY): Payer: Medicare Other | Admitting: Internal Medicine

## 2013-05-31 ENCOUNTER — Encounter: Payer: Self-pay | Admitting: Internal Medicine

## 2013-05-31 DIAGNOSIS — K922 Gastrointestinal hemorrhage, unspecified: Secondary | ICD-10-CM

## 2013-05-31 DIAGNOSIS — N390 Urinary tract infection, site not specified: Secondary | ICD-10-CM

## 2013-05-31 DIAGNOSIS — J189 Pneumonia, unspecified organism: Secondary | ICD-10-CM | POA: Diagnosis not present

## 2013-05-31 DIAGNOSIS — D62 Acute posthemorrhagic anemia: Secondary | ICD-10-CM

## 2013-05-31 DIAGNOSIS — E785 Hyperlipidemia, unspecified: Secondary | ICD-10-CM

## 2013-05-31 DIAGNOSIS — K589 Irritable bowel syndrome without diarrhea: Secondary | ICD-10-CM | POA: Diagnosis not present

## 2013-05-31 DIAGNOSIS — I82409 Acute embolism and thrombosis of unspecified deep veins of unspecified lower extremity: Secondary | ICD-10-CM

## 2013-05-31 DIAGNOSIS — IMO0002 Reserved for concepts with insufficient information to code with codable children: Secondary | ICD-10-CM

## 2013-05-31 DIAGNOSIS — G629 Polyneuropathy, unspecified: Secondary | ICD-10-CM

## 2013-05-31 DIAGNOSIS — E43 Unspecified severe protein-calorie malnutrition: Secondary | ICD-10-CM

## 2013-05-31 DIAGNOSIS — G609 Hereditary and idiopathic neuropathy, unspecified: Secondary | ICD-10-CM

## 2013-05-31 DIAGNOSIS — E2740 Unspecified adrenocortical insufficiency: Secondary | ICD-10-CM

## 2013-05-31 DIAGNOSIS — E2749 Other adrenocortical insufficiency: Secondary | ICD-10-CM

## 2013-05-31 DIAGNOSIS — R569 Unspecified convulsions: Secondary | ICD-10-CM

## 2013-05-31 DIAGNOSIS — F3289 Other specified depressive episodes: Secondary | ICD-10-CM

## 2013-05-31 DIAGNOSIS — M4850XA Collapsed vertebra, not elsewhere classified, site unspecified, initial encounter for fracture: Secondary | ICD-10-CM

## 2013-05-31 DIAGNOSIS — E039 Hypothyroidism, unspecified: Secondary | ICD-10-CM

## 2013-05-31 DIAGNOSIS — F32A Depression, unspecified: Secondary | ICD-10-CM

## 2013-05-31 DIAGNOSIS — F329 Major depressive disorder, single episode, unspecified: Secondary | ICD-10-CM

## 2013-05-31 NOTE — Progress Notes (Signed)
MRN: [id number] Name: [name]  Sex: female Age: 79 y.o. DOB: April 07, 1934  [place] #: [facility] Facility/Room:  903 Level Of Care: SNF Provider: Inocencio Homes D Emergency Contacts: Extended Emergency Contact Information Primary Emergency Contact: Doiron,Fred C Address: [place], [place] Phone: [phone] Mobile Phone: [phone] Relation: Spouse Secondary Emergency Contact: Rouse,Chris Address: [address]          [place], [place] [address] [place] of [place] Phone: [phone] Relation: Daughter  Code Status: DNR  Allergies: Penicillins; Adhesive; Doxycycline; and Morphine and related  Chief Complaint  Patient presents with  . Discharge Note    HPI: Patient is 79 y.o. female who was admitted for OT/PT after being in hospital for [place] and new DVT. She is now ready for discharge.  Past Medical History  Diagnosis Date  . IBS (irritable bowel syndrome)   . Fibromyalgia   . History of DVT (deep vein thrombosis)   . History of pulmonary embolism   . Osteoporosis   . History of breast cancer LEFT BREAST DCIS  1996  . Hyperlipidemia   . 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 Breast cancer RIGHT SIDE-- DX OCT 2011    CHEMORADIATION THERAPY-- COMPLETED   . PONV (postoperative nausea and vomiting)   . Skin tear LEFT ARM COVERED W/ TEGADERM    PT STATES HX MULTIPLE SKIN TEARS -- THIN  . Thin skin SECONARDY AGE AND HX CHEMO  . Ecchymosis   . Seasonal allergies   . Frequency of urination   . Nocturia   . Anemia   . Lung mass PER DR CLANCE NOTE UNCLEAR IF LYMPHOMA FROM BX    FOLLOWED BY DR RUBIN  . Seizures   . Unspecified hereditary and idiopathic peripheral neuropathy   . Asthma   . Fibromyalgia   . Vertebral compression fracture 5/14    L4    Past Surgical History  Procedure Laterality Date  . Total abdominal hysterectomy w/ bilateral  salpingoophorectomy  1977  (APPROX)  . Cholecystectomy    . Appendectomy    . Femur fracture surgery  12-18-2010  DR BEANE    INTRAMEDULLARY NAILING LEFT INTERTROCHANTERIC -SUBTROCHANTERIC FX  . Right breast lumpectomy / removal lymph nodes x2/ removal pac  10-01-2010    CARCINOMA RIGHT BREAST  . Placement port- a- cath  02-18-2010  . Bronchoscopy  01-29-2010    W/ BX  . Tvt vaginal tape  suburethral sling   06-08-2005    SUI  . Bilateral laminectomy/ foraminotomy  01-14-2004    L4 - L5  . Breast lumpectomy  1996     LEFT BREAST DCIS   . Cataract extraction w/ intraocular lens  implant, bilateral    . Bilateral carpal tunnel release    . Tonsillectomy    . Transthoracic echocardiogram  02-25-2010    LVSF NORMAL/ EF 32-44%/ GRADE I DIASTOLIC DYSFUNCTION/ MILDLY DILATED LEFT ATRIUM  . Hardware removal  10/11/2011    Procedure: HARDWARE REMOVAL;  Surgeon: Johnn Hai, MD;  Location: [facility];  Service: Orthopedics;  Laterality: Left;  LEFT THIGH REMOVAL OF DISTAL LOCKING SCREW NEEDS: FLORO, RADIO LUSCENT TABLE AND SCREWDRIVER FOR BIOMET ROD   . Colonoscopy    . Esophagogastroduodenoscopy N/A 03/11/2013    Procedure: ESOPHAGOGASTRODUODENOSCOPY (EGD);  Surgeon: Lear Ng, MD;  Location: [facility];  Service: Endoscopy;  Laterality:  N/A;      Medication List       This list is accurate as of: 05/31/13 11:59 PM.  Always use your most recent med list.               albuterol (2.5 MG/3ML) 0.083% nebulizer solution  Commonly known as:  PROVENTIL  Take 3 mLs (2.5 mg total) by nebulization 3 (three) times daily as needed for wheezing or shortness of breath.     DSS 100 MG Caps  Take 100 mg by mouth 2 (two) times daily.     DULoxetine 60 MG capsule  Commonly known as:  CYMBALTA  Take 60 mg by mouth daily after lunch.     ergocalciferol 50000 UNITS capsule  Commonly known as:  VITAMIN D2  Take 50,000 Units by mouth once a week. tuesday      ezetimibe-simvastatin 10-80 MG per tablet  Commonly known as:  VYTORIN  Take 1 tablet by mouth at bedtime. For hyperlipidemia     gabapentin 300 MG capsule  Commonly known as:  NEURONTIN  Take 300 mg by mouth 3 (three) times daily.     HYDROmorphone 2 MG tablet  Commonly known as:  DILAUDID  Take 0.5 tablets (1 mg total) by mouth 2 (two) times daily. Take 1/2 tablet= 1 mg by mouth twice daily for pain     hyoscyamine 0.375 MG 12 hr tablet  Commonly known as:  LEVBID  Take 1 tablet by mouth every 12 (twelve) hours. For bowel spasms.     levETIRAcetam 500 MG tablet  Commonly known as:  KEPPRA  Take 500 mg by mouth 2 (two) times daily. For seizures     levothyroxine 88 MCG tablet  Commonly known as:  SYNTHROID, LEVOTHROID  Take 88 mcg by mouth daily before breakfast. For hypothyroid     LORazepam 0.5 MG tablet  Commonly known as:  ATIVAN  Take 1 tablet (0.5 mg total) by mouth every 8 (eight) hours as needed. Anxiety     menthol-cetylpyridinium 3 MG lozenge  Commonly known as:  CEPACOL  Take 1 lozenge (3 mg total) by mouth as needed for sore throat.     MICRONIZED COLESTIPOL HCL 1 G tablet  Generic drug:  colestipol  Take 1 tablet by mouth 2 (two) times daily. For hyperlipidemia     omeprazole 20 MG capsule  Commonly known as:  PRILOSEC  Take 20 mg by mouth daily.     polyethylene glycol packet  Commonly known as:  MIRALAX / GLYCOLAX  Take 17 g by mouth daily as needed for mild constipation.     potassium chloride SA 20 MEQ tablet  Commonly known as:  K-DUR,KLOR-CON  Take 2 tablets (40 mEq total) by mouth daily.     warfarin 3 MG tablet  Commonly known as:  COUMADIN  Take 3 mg by mouth daily at 6 PM.        Meds ordered this encounter  Medications  . warfarin (COUMADIN) 3 MG tablet    Sig: Take 3 mg by mouth daily at 6 PM.  . gabapentin (NEURONTIN) 300 MG capsule    Sig: Take 300 mg by mouth 3 (three) times daily.    Immunization History  Administered  Date(s) Administered  . Influenza Whole 10/30/2009, 11/30/2011  . PPD Test 04/17/2013  . Pneumococcal Polysaccharide-23 07/27/2012    History  Substance Use Topics  . Smoking status: Former Smoker -- 1.00 packs/day for 30 years    Types: Cigarettes  Quit date: 03/01/1978  . Smokeless tobacco: Never Used  . Alcohol Use: 4.2 oz/week    7 Glasses of wine per week    Filed Vitals:   05/31/13 1620  BP: 129/80  Pulse: 96  Temp: 97.5 F (36.4 C)  Resp: 22    Physical Exam  GENERAL APPEARANCE: Alert, conversant. Appropriately groomed. No acute distress.  HEENT: Unremarkable. RESPIRATORY: Breathing is even, unlabored  CARDIOVASCULAR:  No peripheral edema.  NEUROLOGIC: Cranial nerves 2-12 grossly intact. Moves all extremities no tremor.  Patient Active Problem List   Diagnosis Date Noted  . CAP (community acquired pneumonia) 05/04/2013  . H. pylori infection 05/04/2013  . DVT, LLE 04/18/2013  . Depression 04/18/2013  . Hypothyroidism 04/18/2013  . UTI (urinary tract infection) 04/10/2013  . Hyponatremia 04/10/2013  . Hypokalemia 04/10/2013  . Leg edema, left 04/10/2013  . Nausea with vomiting 04/10/2013  . Adult failure to thrive 04/10/2013  . Protein-calorie malnutrition, severe 04/10/2013  . Renal insufficiency 03/10/2013  . GI bleed 03/10/2013  . UGIB (upper gastrointestinal bleed) 03/10/2013  . Breast cancer of upper-outer quadrant of right female breast 01/04/2013  . Neoplasm of left breast, primary tumor staging category Tis: ductal carcinoma in situ (DCIS) 01/04/2013  . Vertebral compression fracture 10/30/2012  . Adrenal insufficiency 10/30/2012  . Osteoporosis 10/30/2012  . Irritable bowel syndrome 10/30/2012  . Peripheral neuropathy 10/30/2012  . Unsteady gait 10/30/2012  . Recurrent falls 10/30/2012  . Chronic cough 07/18/2012  . Snoring 06/14/2012  . Thin skin   . Ecchymosis   . Seasonal allergies   . Frequency of urination   . Nocturia   . Anemia    . Seizures   . Unspecified hereditary and idiopathic peripheral neuropathy   . Acute blood loss anemia 01/02/2011  . Low serum cortisol level 01/02/2011  . Breast cancer, stage 2 Right 09/07/2010  . HYPERLIPIDEMIA 01/23/2010  . OBSTRUCTIVE SLEEP APNEA 01/23/2010  . MASS, LUNG 01/23/2010    CBC    Component Value Date/Time   WBC 8.6 04/17/2013 0425   WBC 9.2 01/04/2013 1330   RBC 3.45* 04/17/2013 0425   RBC 3.89 01/04/2013 1330   HGB 9.8* 04/17/2013 0425   HGB 11.8 01/04/2013 1330   HCT 30.3* 04/17/2013 0425   HCT 36.6 01/04/2013 1330   PLT 209 04/17/2013 0425   PLT 217 01/04/2013 1330   MCV 87.8 04/17/2013 0425   MCV 94.0 01/04/2013 1330   LYMPHSABS 2.0 03/10/2013 1413   LYMPHSABS 1.9 01/04/2013 1330   MONOABS 1.5* 03/10/2013 1413   MONOABS 0.7 01/04/2013 1330   EOSABS 0.1 03/10/2013 1413   EOSABS 0.1 01/04/2013 1330   BASOSABS 0.0 03/10/2013 1413   BASOSABS 0.1 01/04/2013 1330    CMP     Component Value Date/Time   NA 139 04/17/2013 0425   NA 141 01/04/2013 1330   K 3.6* 04/17/2013 0425   K 3.7 01/04/2013 1330   CL 102 04/17/2013 0425   CL 102 07/04/2012 1456   CO2 25 04/17/2013 0425   CO2 25 01/04/2013 1330   GLUCOSE 94 04/17/2013 0425   GLUCOSE 82 01/04/2013 1330   GLUCOSE 92 07/04/2012 1456   BUN 12 04/17/2013 0425   BUN 18.3 01/04/2013 1330   CREATININE 0.97 04/17/2013 0425   CREATININE 0.8 01/04/2013 1330   CALCIUM 7.9* 04/17/2013 0425   CALCIUM 9.3 01/04/2013 1330   PROT 5.0* 04/17/2013 0425   PROT 6.4 01/04/2013 1330   ALBUMIN 2.2* 04/17/2013 0425   ALBUMIN 2.8*  01/04/2013 1330   AST 20 04/17/2013 0425   AST 17 01/04/2013 1330   ALT 24 04/17/2013 0425   ALT 30 01/04/2013 1330   ALKPHOS 77 04/17/2013 0425   ALKPHOS 139 01/04/2013 1330   BILITOT <0.2* 04/17/2013 0425   BILITOT 0.48 01/04/2013 1330   GFRNONAA 55* 04/17/2013 0425   GFRAA 64* 04/17/2013 0425    Assessment and Plan  Pt is being d/c to home with HH/PT/OT/nursing/CNA/social worker.  Hennie Duos, MD

## 2013-06-03 DIAGNOSIS — Z8501 Personal history of malignant neoplasm of esophagus: Secondary | ICD-10-CM | POA: Diagnosis not present

## 2013-06-03 DIAGNOSIS — F411 Generalized anxiety disorder: Secondary | ICD-10-CM | POA: Diagnosis not present

## 2013-06-03 DIAGNOSIS — T451X5A Adverse effect of antineoplastic and immunosuppressive drugs, initial encounter: Secondary | ICD-10-CM | POA: Diagnosis not present

## 2013-06-03 DIAGNOSIS — I82409 Acute embolism and thrombosis of unspecified deep veins of unspecified lower extremity: Secondary | ICD-10-CM | POA: Diagnosis not present

## 2013-06-03 DIAGNOSIS — L97809 Non-pressure chronic ulcer of other part of unspecified lower leg with unspecified severity: Secondary | ICD-10-CM | POA: Diagnosis not present

## 2013-06-03 DIAGNOSIS — Z8711 Personal history of peptic ulcer disease: Secondary | ICD-10-CM | POA: Diagnosis not present

## 2013-06-03 DIAGNOSIS — Z4800 Encounter for change or removal of nonsurgical wound dressing: Secondary | ICD-10-CM | POA: Diagnosis not present

## 2013-06-03 DIAGNOSIS — R569 Unspecified convulsions: Secondary | ICD-10-CM | POA: Diagnosis not present

## 2013-06-03 DIAGNOSIS — Z7901 Long term (current) use of anticoagulants: Secondary | ICD-10-CM | POA: Diagnosis not present

## 2013-06-03 DIAGNOSIS — D500 Iron deficiency anemia secondary to blood loss (chronic): Secondary | ICD-10-CM | POA: Diagnosis not present

## 2013-06-03 DIAGNOSIS — K589 Irritable bowel syndrome without diarrhea: Secondary | ICD-10-CM | POA: Diagnosis not present

## 2013-06-03 DIAGNOSIS — E2749 Other adrenocortical insufficiency: Secondary | ICD-10-CM | POA: Diagnosis not present

## 2013-06-03 DIAGNOSIS — Z5181 Encounter for therapeutic drug level monitoring: Secondary | ICD-10-CM | POA: Diagnosis not present

## 2013-06-03 DIAGNOSIS — Z8701 Personal history of pneumonia (recurrent): Secondary | ICD-10-CM | POA: Diagnosis not present

## 2013-06-03 DIAGNOSIS — IMO0001 Reserved for inherently not codable concepts without codable children: Secondary | ICD-10-CM | POA: Diagnosis not present

## 2013-06-03 DIAGNOSIS — Z87440 Personal history of urinary (tract) infections: Secondary | ICD-10-CM | POA: Diagnosis not present

## 2013-06-03 DIAGNOSIS — I872 Venous insufficiency (chronic) (peripheral): Secondary | ICD-10-CM | POA: Diagnosis not present

## 2013-06-03 DIAGNOSIS — Z87891 Personal history of nicotine dependence: Secondary | ICD-10-CM | POA: Diagnosis not present

## 2013-06-03 DIAGNOSIS — Z8543 Personal history of malignant neoplasm of ovary: Secondary | ICD-10-CM | POA: Diagnosis not present

## 2013-06-03 DIAGNOSIS — G620 Drug-induced polyneuropathy: Secondary | ICD-10-CM | POA: Diagnosis not present

## 2013-06-04 DIAGNOSIS — I872 Venous insufficiency (chronic) (peripheral): Secondary | ICD-10-CM | POA: Diagnosis not present

## 2013-06-04 DIAGNOSIS — L97809 Non-pressure chronic ulcer of other part of unspecified lower leg with unspecified severity: Secondary | ICD-10-CM | POA: Diagnosis not present

## 2013-06-04 DIAGNOSIS — G620 Drug-induced polyneuropathy: Secondary | ICD-10-CM | POA: Diagnosis not present

## 2013-06-04 DIAGNOSIS — T451X5A Adverse effect of antineoplastic and immunosuppressive drugs, initial encounter: Secondary | ICD-10-CM | POA: Diagnosis not present

## 2013-06-04 DIAGNOSIS — IMO0001 Reserved for inherently not codable concepts without codable children: Secondary | ICD-10-CM | POA: Diagnosis not present

## 2013-06-04 DIAGNOSIS — I82409 Acute embolism and thrombosis of unspecified deep veins of unspecified lower extremity: Secondary | ICD-10-CM | POA: Diagnosis not present

## 2013-06-05 ENCOUNTER — Ambulatory Visit: Payer: Medicare Other | Admitting: Neurology

## 2013-06-05 DIAGNOSIS — T451X5A Adverse effect of antineoplastic and immunosuppressive drugs, initial encounter: Secondary | ICD-10-CM | POA: Diagnosis not present

## 2013-06-05 DIAGNOSIS — L97809 Non-pressure chronic ulcer of other part of unspecified lower leg with unspecified severity: Secondary | ICD-10-CM | POA: Diagnosis not present

## 2013-06-05 DIAGNOSIS — G620 Drug-induced polyneuropathy: Secondary | ICD-10-CM | POA: Diagnosis not present

## 2013-06-05 DIAGNOSIS — I82409 Acute embolism and thrombosis of unspecified deep veins of unspecified lower extremity: Secondary | ICD-10-CM | POA: Diagnosis not present

## 2013-06-05 DIAGNOSIS — I872 Venous insufficiency (chronic) (peripheral): Secondary | ICD-10-CM | POA: Diagnosis not present

## 2013-06-05 DIAGNOSIS — IMO0001 Reserved for inherently not codable concepts without codable children: Secondary | ICD-10-CM | POA: Diagnosis not present

## 2013-06-07 DIAGNOSIS — I82409 Acute embolism and thrombosis of unspecified deep veins of unspecified lower extremity: Secondary | ICD-10-CM | POA: Diagnosis not present

## 2013-06-07 DIAGNOSIS — G620 Drug-induced polyneuropathy: Secondary | ICD-10-CM | POA: Diagnosis not present

## 2013-06-07 DIAGNOSIS — T451X5A Adverse effect of antineoplastic and immunosuppressive drugs, initial encounter: Secondary | ICD-10-CM | POA: Diagnosis not present

## 2013-06-07 DIAGNOSIS — IMO0001 Reserved for inherently not codable concepts without codable children: Secondary | ICD-10-CM | POA: Diagnosis not present

## 2013-06-07 DIAGNOSIS — L97809 Non-pressure chronic ulcer of other part of unspecified lower leg with unspecified severity: Secondary | ICD-10-CM | POA: Diagnosis not present

## 2013-06-07 DIAGNOSIS — I872 Venous insufficiency (chronic) (peripheral): Secondary | ICD-10-CM | POA: Diagnosis not present

## 2013-06-08 DIAGNOSIS — IMO0001 Reserved for inherently not codable concepts without codable children: Secondary | ICD-10-CM | POA: Diagnosis not present

## 2013-06-08 DIAGNOSIS — T451X5A Adverse effect of antineoplastic and immunosuppressive drugs, initial encounter: Secondary | ICD-10-CM | POA: Diagnosis not present

## 2013-06-08 DIAGNOSIS — I872 Venous insufficiency (chronic) (peripheral): Secondary | ICD-10-CM | POA: Diagnosis not present

## 2013-06-08 DIAGNOSIS — G620 Drug-induced polyneuropathy: Secondary | ICD-10-CM | POA: Diagnosis not present

## 2013-06-08 DIAGNOSIS — E2749 Other adrenocortical insufficiency: Secondary | ICD-10-CM | POA: Diagnosis not present

## 2013-06-08 DIAGNOSIS — I82409 Acute embolism and thrombosis of unspecified deep veins of unspecified lower extremity: Secondary | ICD-10-CM | POA: Diagnosis not present

## 2013-06-08 DIAGNOSIS — I824Z9 Acute embolism and thrombosis of unspecified deep veins of unspecified distal lower extremity: Secondary | ICD-10-CM | POA: Diagnosis not present

## 2013-06-08 DIAGNOSIS — Z79899 Other long term (current) drug therapy: Secondary | ICD-10-CM | POA: Diagnosis not present

## 2013-06-08 DIAGNOSIS — L97809 Non-pressure chronic ulcer of other part of unspecified lower leg with unspecified severity: Secondary | ICD-10-CM | POA: Diagnosis not present

## 2013-06-08 DIAGNOSIS — K589 Irritable bowel syndrome without diarrhea: Secondary | ICD-10-CM | POA: Diagnosis not present

## 2013-06-08 DIAGNOSIS — R269 Unspecified abnormalities of gait and mobility: Secondary | ICD-10-CM | POA: Diagnosis not present

## 2013-06-08 DIAGNOSIS — E039 Hypothyroidism, unspecified: Secondary | ICD-10-CM | POA: Diagnosis not present

## 2013-06-11 DIAGNOSIS — G620 Drug-induced polyneuropathy: Secondary | ICD-10-CM | POA: Diagnosis not present

## 2013-06-11 DIAGNOSIS — T451X5A Adverse effect of antineoplastic and immunosuppressive drugs, initial encounter: Secondary | ICD-10-CM | POA: Diagnosis not present

## 2013-06-11 DIAGNOSIS — L97809 Non-pressure chronic ulcer of other part of unspecified lower leg with unspecified severity: Secondary | ICD-10-CM | POA: Diagnosis not present

## 2013-06-11 DIAGNOSIS — I872 Venous insufficiency (chronic) (peripheral): Secondary | ICD-10-CM | POA: Diagnosis not present

## 2013-06-11 DIAGNOSIS — I82409 Acute embolism and thrombosis of unspecified deep veins of unspecified lower extremity: Secondary | ICD-10-CM | POA: Diagnosis not present

## 2013-06-11 DIAGNOSIS — IMO0001 Reserved for inherently not codable concepts without codable children: Secondary | ICD-10-CM | POA: Diagnosis not present

## 2013-06-12 DIAGNOSIS — T451X5A Adverse effect of antineoplastic and immunosuppressive drugs, initial encounter: Secondary | ICD-10-CM | POA: Diagnosis not present

## 2013-06-12 DIAGNOSIS — L97809 Non-pressure chronic ulcer of other part of unspecified lower leg with unspecified severity: Secondary | ICD-10-CM | POA: Diagnosis not present

## 2013-06-12 DIAGNOSIS — G620 Drug-induced polyneuropathy: Secondary | ICD-10-CM | POA: Diagnosis not present

## 2013-06-12 DIAGNOSIS — IMO0001 Reserved for inherently not codable concepts without codable children: Secondary | ICD-10-CM | POA: Diagnosis not present

## 2013-06-12 DIAGNOSIS — I82409 Acute embolism and thrombosis of unspecified deep veins of unspecified lower extremity: Secondary | ICD-10-CM | POA: Diagnosis not present

## 2013-06-12 DIAGNOSIS — I872 Venous insufficiency (chronic) (peripheral): Secondary | ICD-10-CM | POA: Diagnosis not present

## 2013-06-13 DIAGNOSIS — I82409 Acute embolism and thrombosis of unspecified deep veins of unspecified lower extremity: Secondary | ICD-10-CM | POA: Diagnosis not present

## 2013-06-13 DIAGNOSIS — I872 Venous insufficiency (chronic) (peripheral): Secondary | ICD-10-CM | POA: Diagnosis not present

## 2013-06-13 DIAGNOSIS — L97809 Non-pressure chronic ulcer of other part of unspecified lower leg with unspecified severity: Secondary | ICD-10-CM | POA: Diagnosis not present

## 2013-06-13 DIAGNOSIS — T451X5A Adverse effect of antineoplastic and immunosuppressive drugs, initial encounter: Secondary | ICD-10-CM | POA: Diagnosis not present

## 2013-06-13 DIAGNOSIS — G620 Drug-induced polyneuropathy: Secondary | ICD-10-CM | POA: Diagnosis not present

## 2013-06-13 DIAGNOSIS — IMO0001 Reserved for inherently not codable concepts without codable children: Secondary | ICD-10-CM | POA: Diagnosis not present

## 2013-06-14 DIAGNOSIS — I82409 Acute embolism and thrombosis of unspecified deep veins of unspecified lower extremity: Secondary | ICD-10-CM | POA: Diagnosis not present

## 2013-06-14 DIAGNOSIS — I872 Venous insufficiency (chronic) (peripheral): Secondary | ICD-10-CM | POA: Diagnosis not present

## 2013-06-14 DIAGNOSIS — G620 Drug-induced polyneuropathy: Secondary | ICD-10-CM | POA: Diagnosis not present

## 2013-06-14 DIAGNOSIS — L97809 Non-pressure chronic ulcer of other part of unspecified lower leg with unspecified severity: Secondary | ICD-10-CM | POA: Diagnosis not present

## 2013-06-14 DIAGNOSIS — T451X5A Adverse effect of antineoplastic and immunosuppressive drugs, initial encounter: Secondary | ICD-10-CM | POA: Diagnosis not present

## 2013-06-14 DIAGNOSIS — IMO0001 Reserved for inherently not codable concepts without codable children: Secondary | ICD-10-CM | POA: Diagnosis not present

## 2013-06-15 ENCOUNTER — Encounter (HOSPITAL_BASED_OUTPATIENT_CLINIC_OR_DEPARTMENT_OTHER): Payer: Medicare Other | Attending: General Surgery

## 2013-06-15 ENCOUNTER — Ambulatory Visit: Payer: Medicare Other | Admitting: Neurology

## 2013-06-15 DIAGNOSIS — T451X5A Adverse effect of antineoplastic and immunosuppressive drugs, initial encounter: Secondary | ICD-10-CM | POA: Diagnosis not present

## 2013-06-15 DIAGNOSIS — I872 Venous insufficiency (chronic) (peripheral): Secondary | ICD-10-CM | POA: Diagnosis not present

## 2013-06-15 DIAGNOSIS — IMO0001 Reserved for inherently not codable concepts without codable children: Secondary | ICD-10-CM | POA: Diagnosis not present

## 2013-06-15 DIAGNOSIS — I82409 Acute embolism and thrombosis of unspecified deep veins of unspecified lower extremity: Secondary | ICD-10-CM | POA: Diagnosis not present

## 2013-06-15 DIAGNOSIS — L97809 Non-pressure chronic ulcer of other part of unspecified lower leg with unspecified severity: Secondary | ICD-10-CM | POA: Diagnosis not present

## 2013-06-15 DIAGNOSIS — G620 Drug-induced polyneuropathy: Secondary | ICD-10-CM | POA: Diagnosis not present

## 2013-06-16 DIAGNOSIS — G620 Drug-induced polyneuropathy: Secondary | ICD-10-CM | POA: Diagnosis not present

## 2013-06-16 DIAGNOSIS — L97809 Non-pressure chronic ulcer of other part of unspecified lower leg with unspecified severity: Secondary | ICD-10-CM | POA: Diagnosis not present

## 2013-06-16 DIAGNOSIS — I82409 Acute embolism and thrombosis of unspecified deep veins of unspecified lower extremity: Secondary | ICD-10-CM | POA: Diagnosis not present

## 2013-06-16 DIAGNOSIS — T451X5A Adverse effect of antineoplastic and immunosuppressive drugs, initial encounter: Secondary | ICD-10-CM | POA: Diagnosis not present

## 2013-06-16 DIAGNOSIS — IMO0001 Reserved for inherently not codable concepts without codable children: Secondary | ICD-10-CM | POA: Diagnosis not present

## 2013-06-16 DIAGNOSIS — I872 Venous insufficiency (chronic) (peripheral): Secondary | ICD-10-CM | POA: Diagnosis not present

## 2013-06-17 DIAGNOSIS — G620 Drug-induced polyneuropathy: Secondary | ICD-10-CM | POA: Diagnosis not present

## 2013-06-17 DIAGNOSIS — IMO0001 Reserved for inherently not codable concepts without codable children: Secondary | ICD-10-CM | POA: Diagnosis not present

## 2013-06-17 DIAGNOSIS — T451X5A Adverse effect of antineoplastic and immunosuppressive drugs, initial encounter: Secondary | ICD-10-CM | POA: Diagnosis not present

## 2013-06-17 DIAGNOSIS — L97809 Non-pressure chronic ulcer of other part of unspecified lower leg with unspecified severity: Secondary | ICD-10-CM | POA: Diagnosis not present

## 2013-06-17 DIAGNOSIS — I82409 Acute embolism and thrombosis of unspecified deep veins of unspecified lower extremity: Secondary | ICD-10-CM | POA: Diagnosis not present

## 2013-06-17 DIAGNOSIS — I872 Venous insufficiency (chronic) (peripheral): Secondary | ICD-10-CM | POA: Diagnosis not present

## 2013-06-18 DIAGNOSIS — I872 Venous insufficiency (chronic) (peripheral): Secondary | ICD-10-CM | POA: Diagnosis not present

## 2013-06-18 DIAGNOSIS — T451X5A Adverse effect of antineoplastic and immunosuppressive drugs, initial encounter: Secondary | ICD-10-CM | POA: Diagnosis not present

## 2013-06-18 DIAGNOSIS — L97809 Non-pressure chronic ulcer of other part of unspecified lower leg with unspecified severity: Secondary | ICD-10-CM | POA: Diagnosis not present

## 2013-06-18 DIAGNOSIS — IMO0001 Reserved for inherently not codable concepts without codable children: Secondary | ICD-10-CM | POA: Diagnosis not present

## 2013-06-18 DIAGNOSIS — G620 Drug-induced polyneuropathy: Secondary | ICD-10-CM | POA: Diagnosis not present

## 2013-06-18 DIAGNOSIS — I82409 Acute embolism and thrombosis of unspecified deep veins of unspecified lower extremity: Secondary | ICD-10-CM | POA: Diagnosis not present

## 2013-06-19 DIAGNOSIS — L97809 Non-pressure chronic ulcer of other part of unspecified lower leg with unspecified severity: Secondary | ICD-10-CM | POA: Diagnosis not present

## 2013-06-19 DIAGNOSIS — IMO0001 Reserved for inherently not codable concepts without codable children: Secondary | ICD-10-CM | POA: Diagnosis not present

## 2013-06-19 DIAGNOSIS — G620 Drug-induced polyneuropathy: Secondary | ICD-10-CM | POA: Diagnosis not present

## 2013-06-19 DIAGNOSIS — I872 Venous insufficiency (chronic) (peripheral): Secondary | ICD-10-CM | POA: Diagnosis not present

## 2013-06-19 DIAGNOSIS — T451X5A Adverse effect of antineoplastic and immunosuppressive drugs, initial encounter: Secondary | ICD-10-CM | POA: Diagnosis not present

## 2013-06-19 DIAGNOSIS — I82409 Acute embolism and thrombosis of unspecified deep veins of unspecified lower extremity: Secondary | ICD-10-CM | POA: Diagnosis not present

## 2013-06-20 DIAGNOSIS — I872 Venous insufficiency (chronic) (peripheral): Secondary | ICD-10-CM | POA: Diagnosis not present

## 2013-06-20 DIAGNOSIS — I82409 Acute embolism and thrombosis of unspecified deep veins of unspecified lower extremity: Secondary | ICD-10-CM | POA: Diagnosis not present

## 2013-06-20 DIAGNOSIS — IMO0001 Reserved for inherently not codable concepts without codable children: Secondary | ICD-10-CM | POA: Diagnosis not present

## 2013-06-20 DIAGNOSIS — G620 Drug-induced polyneuropathy: Secondary | ICD-10-CM | POA: Diagnosis not present

## 2013-06-20 DIAGNOSIS — T451X5A Adverse effect of antineoplastic and immunosuppressive drugs, initial encounter: Secondary | ICD-10-CM | POA: Diagnosis not present

## 2013-06-20 DIAGNOSIS — L97809 Non-pressure chronic ulcer of other part of unspecified lower leg with unspecified severity: Secondary | ICD-10-CM | POA: Diagnosis not present

## 2013-06-21 DIAGNOSIS — I82409 Acute embolism and thrombosis of unspecified deep veins of unspecified lower extremity: Secondary | ICD-10-CM | POA: Diagnosis not present

## 2013-06-21 DIAGNOSIS — I872 Venous insufficiency (chronic) (peripheral): Secondary | ICD-10-CM | POA: Diagnosis not present

## 2013-06-21 DIAGNOSIS — IMO0001 Reserved for inherently not codable concepts without codable children: Secondary | ICD-10-CM | POA: Diagnosis not present

## 2013-06-21 DIAGNOSIS — T451X5A Adverse effect of antineoplastic and immunosuppressive drugs, initial encounter: Secondary | ICD-10-CM | POA: Diagnosis not present

## 2013-06-21 DIAGNOSIS — L97809 Non-pressure chronic ulcer of other part of unspecified lower leg with unspecified severity: Secondary | ICD-10-CM | POA: Diagnosis not present

## 2013-06-21 DIAGNOSIS — G620 Drug-induced polyneuropathy: Secondary | ICD-10-CM | POA: Diagnosis not present

## 2013-06-22 DIAGNOSIS — IMO0001 Reserved for inherently not codable concepts without codable children: Secondary | ICD-10-CM | POA: Diagnosis not present

## 2013-06-22 DIAGNOSIS — G620 Drug-induced polyneuropathy: Secondary | ICD-10-CM | POA: Diagnosis not present

## 2013-06-22 DIAGNOSIS — L97809 Non-pressure chronic ulcer of other part of unspecified lower leg with unspecified severity: Secondary | ICD-10-CM | POA: Diagnosis not present

## 2013-06-22 DIAGNOSIS — T451X5A Adverse effect of antineoplastic and immunosuppressive drugs, initial encounter: Secondary | ICD-10-CM | POA: Diagnosis not present

## 2013-06-22 DIAGNOSIS — I872 Venous insufficiency (chronic) (peripheral): Secondary | ICD-10-CM | POA: Diagnosis not present

## 2013-06-22 DIAGNOSIS — I82409 Acute embolism and thrombosis of unspecified deep veins of unspecified lower extremity: Secondary | ICD-10-CM | POA: Diagnosis not present

## 2013-06-23 DIAGNOSIS — L97809 Non-pressure chronic ulcer of other part of unspecified lower leg with unspecified severity: Secondary | ICD-10-CM | POA: Diagnosis not present

## 2013-06-23 DIAGNOSIS — I82409 Acute embolism and thrombosis of unspecified deep veins of unspecified lower extremity: Secondary | ICD-10-CM | POA: Diagnosis not present

## 2013-06-23 DIAGNOSIS — G620 Drug-induced polyneuropathy: Secondary | ICD-10-CM | POA: Diagnosis not present

## 2013-06-23 DIAGNOSIS — I872 Venous insufficiency (chronic) (peripheral): Secondary | ICD-10-CM | POA: Diagnosis not present

## 2013-06-23 DIAGNOSIS — IMO0001 Reserved for inherently not codable concepts without codable children: Secondary | ICD-10-CM | POA: Diagnosis not present

## 2013-06-23 DIAGNOSIS — T451X5A Adverse effect of antineoplastic and immunosuppressive drugs, initial encounter: Secondary | ICD-10-CM | POA: Diagnosis not present

## 2013-06-25 ENCOUNTER — Telehealth: Payer: Self-pay | Admitting: *Deleted

## 2013-06-25 DIAGNOSIS — T451X5A Adverse effect of antineoplastic and immunosuppressive drugs, initial encounter: Secondary | ICD-10-CM | POA: Diagnosis not present

## 2013-06-25 DIAGNOSIS — G620 Drug-induced polyneuropathy: Secondary | ICD-10-CM | POA: Diagnosis not present

## 2013-06-25 DIAGNOSIS — IMO0001 Reserved for inherently not codable concepts without codable children: Secondary | ICD-10-CM | POA: Diagnosis not present

## 2013-06-25 DIAGNOSIS — L97809 Non-pressure chronic ulcer of other part of unspecified lower leg with unspecified severity: Secondary | ICD-10-CM | POA: Diagnosis not present

## 2013-06-25 DIAGNOSIS — I82409 Acute embolism and thrombosis of unspecified deep veins of unspecified lower extremity: Secondary | ICD-10-CM | POA: Diagnosis not present

## 2013-06-25 DIAGNOSIS — I872 Venous insufficiency (chronic) (peripheral): Secondary | ICD-10-CM | POA: Diagnosis not present

## 2013-06-25 NOTE — Telephone Encounter (Signed)
Pt returned my call and I explained the reason for having to move her appt - she is not having any problems with her cancer right now and is fine with that.  She however, will only see Dr. Jana Hakim.  Confirmed 08/01/13 appt w/ pt.

## 2013-06-26 DIAGNOSIS — I872 Venous insufficiency (chronic) (peripheral): Secondary | ICD-10-CM | POA: Diagnosis not present

## 2013-06-26 DIAGNOSIS — IMO0001 Reserved for inherently not codable concepts without codable children: Secondary | ICD-10-CM | POA: Diagnosis not present

## 2013-06-26 DIAGNOSIS — I82409 Acute embolism and thrombosis of unspecified deep veins of unspecified lower extremity: Secondary | ICD-10-CM | POA: Diagnosis not present

## 2013-06-26 DIAGNOSIS — G620 Drug-induced polyneuropathy: Secondary | ICD-10-CM | POA: Diagnosis not present

## 2013-06-26 DIAGNOSIS — T451X5A Adverse effect of antineoplastic and immunosuppressive drugs, initial encounter: Secondary | ICD-10-CM | POA: Diagnosis not present

## 2013-06-26 DIAGNOSIS — L97809 Non-pressure chronic ulcer of other part of unspecified lower leg with unspecified severity: Secondary | ICD-10-CM | POA: Diagnosis not present

## 2013-06-27 DIAGNOSIS — L97809 Non-pressure chronic ulcer of other part of unspecified lower leg with unspecified severity: Secondary | ICD-10-CM | POA: Diagnosis not present

## 2013-06-27 DIAGNOSIS — G620 Drug-induced polyneuropathy: Secondary | ICD-10-CM | POA: Diagnosis not present

## 2013-06-27 DIAGNOSIS — I82409 Acute embolism and thrombosis of unspecified deep veins of unspecified lower extremity: Secondary | ICD-10-CM | POA: Diagnosis not present

## 2013-06-27 DIAGNOSIS — I872 Venous insufficiency (chronic) (peripheral): Secondary | ICD-10-CM | POA: Diagnosis not present

## 2013-06-27 DIAGNOSIS — T451X5A Adverse effect of antineoplastic and immunosuppressive drugs, initial encounter: Secondary | ICD-10-CM | POA: Diagnosis not present

## 2013-06-27 DIAGNOSIS — IMO0001 Reserved for inherently not codable concepts without codable children: Secondary | ICD-10-CM | POA: Diagnosis not present

## 2013-06-28 ENCOUNTER — Other Ambulatory Visit: Payer: Medicare Other

## 2013-06-28 ENCOUNTER — Ambulatory Visit: Payer: Medicare Other | Admitting: Oncology

## 2013-06-28 ENCOUNTER — Ambulatory Visit: Payer: Medicare Other | Admitting: Physician Assistant

## 2013-06-28 DIAGNOSIS — I82409 Acute embolism and thrombosis of unspecified deep veins of unspecified lower extremity: Secondary | ICD-10-CM | POA: Diagnosis not present

## 2013-06-28 DIAGNOSIS — L97809 Non-pressure chronic ulcer of other part of unspecified lower leg with unspecified severity: Secondary | ICD-10-CM | POA: Diagnosis not present

## 2013-06-28 DIAGNOSIS — IMO0001 Reserved for inherently not codable concepts without codable children: Secondary | ICD-10-CM | POA: Diagnosis not present

## 2013-06-28 DIAGNOSIS — Z7901 Long term (current) use of anticoagulants: Secondary | ICD-10-CM | POA: Diagnosis not present

## 2013-06-28 DIAGNOSIS — G620 Drug-induced polyneuropathy: Secondary | ICD-10-CM | POA: Diagnosis not present

## 2013-06-28 DIAGNOSIS — T451X5A Adverse effect of antineoplastic and immunosuppressive drugs, initial encounter: Secondary | ICD-10-CM | POA: Diagnosis not present

## 2013-06-28 DIAGNOSIS — I872 Venous insufficiency (chronic) (peripheral): Secondary | ICD-10-CM | POA: Diagnosis not present

## 2013-06-29 ENCOUNTER — Ambulatory Visit: Payer: Medicare Other | Admitting: Physician Assistant

## 2013-06-29 ENCOUNTER — Other Ambulatory Visit: Payer: Medicare Other

## 2013-06-30 DIAGNOSIS — G620 Drug-induced polyneuropathy: Secondary | ICD-10-CM | POA: Diagnosis not present

## 2013-06-30 DIAGNOSIS — L97809 Non-pressure chronic ulcer of other part of unspecified lower leg with unspecified severity: Secondary | ICD-10-CM | POA: Diagnosis not present

## 2013-06-30 DIAGNOSIS — I82409 Acute embolism and thrombosis of unspecified deep veins of unspecified lower extremity: Secondary | ICD-10-CM | POA: Diagnosis not present

## 2013-06-30 DIAGNOSIS — T451X5A Adverse effect of antineoplastic and immunosuppressive drugs, initial encounter: Secondary | ICD-10-CM | POA: Diagnosis not present

## 2013-06-30 DIAGNOSIS — IMO0001 Reserved for inherently not codable concepts without codable children: Secondary | ICD-10-CM | POA: Diagnosis not present

## 2013-06-30 DIAGNOSIS — I872 Venous insufficiency (chronic) (peripheral): Secondary | ICD-10-CM | POA: Diagnosis not present

## 2013-07-03 DIAGNOSIS — I82409 Acute embolism and thrombosis of unspecified deep veins of unspecified lower extremity: Secondary | ICD-10-CM | POA: Diagnosis not present

## 2013-07-03 DIAGNOSIS — I872 Venous insufficiency (chronic) (peripheral): Secondary | ICD-10-CM | POA: Diagnosis not present

## 2013-07-03 DIAGNOSIS — L97809 Non-pressure chronic ulcer of other part of unspecified lower leg with unspecified severity: Secondary | ICD-10-CM | POA: Diagnosis not present

## 2013-07-03 DIAGNOSIS — T451X5A Adverse effect of antineoplastic and immunosuppressive drugs, initial encounter: Secondary | ICD-10-CM | POA: Diagnosis not present

## 2013-07-03 DIAGNOSIS — IMO0001 Reserved for inherently not codable concepts without codable children: Secondary | ICD-10-CM | POA: Diagnosis not present

## 2013-07-03 DIAGNOSIS — G620 Drug-induced polyneuropathy: Secondary | ICD-10-CM | POA: Diagnosis not present

## 2013-07-04 DIAGNOSIS — R634 Abnormal weight loss: Secondary | ICD-10-CM | POA: Diagnosis not present

## 2013-07-04 DIAGNOSIS — K5289 Other specified noninfective gastroenteritis and colitis: Secondary | ICD-10-CM | POA: Diagnosis not present

## 2013-07-04 DIAGNOSIS — K219 Gastro-esophageal reflux disease without esophagitis: Secondary | ICD-10-CM | POA: Diagnosis not present

## 2013-07-05 DIAGNOSIS — G620 Drug-induced polyneuropathy: Secondary | ICD-10-CM | POA: Diagnosis not present

## 2013-07-05 DIAGNOSIS — L97809 Non-pressure chronic ulcer of other part of unspecified lower leg with unspecified severity: Secondary | ICD-10-CM | POA: Diagnosis not present

## 2013-07-05 DIAGNOSIS — IMO0001 Reserved for inherently not codable concepts without codable children: Secondary | ICD-10-CM | POA: Diagnosis not present

## 2013-07-05 DIAGNOSIS — T451X5A Adverse effect of antineoplastic and immunosuppressive drugs, initial encounter: Secondary | ICD-10-CM | POA: Diagnosis not present

## 2013-07-05 DIAGNOSIS — I872 Venous insufficiency (chronic) (peripheral): Secondary | ICD-10-CM | POA: Diagnosis not present

## 2013-07-05 DIAGNOSIS — I82409 Acute embolism and thrombosis of unspecified deep veins of unspecified lower extremity: Secondary | ICD-10-CM | POA: Diagnosis not present

## 2013-07-10 DIAGNOSIS — IMO0001 Reserved for inherently not codable concepts without codable children: Secondary | ICD-10-CM | POA: Diagnosis not present

## 2013-07-10 DIAGNOSIS — G620 Drug-induced polyneuropathy: Secondary | ICD-10-CM | POA: Diagnosis not present

## 2013-07-10 DIAGNOSIS — I82409 Acute embolism and thrombosis of unspecified deep veins of unspecified lower extremity: Secondary | ICD-10-CM | POA: Diagnosis not present

## 2013-07-10 DIAGNOSIS — I872 Venous insufficiency (chronic) (peripheral): Secondary | ICD-10-CM | POA: Diagnosis not present

## 2013-07-10 DIAGNOSIS — L97809 Non-pressure chronic ulcer of other part of unspecified lower leg with unspecified severity: Secondary | ICD-10-CM | POA: Diagnosis not present

## 2013-07-10 DIAGNOSIS — T451X5A Adverse effect of antineoplastic and immunosuppressive drugs, initial encounter: Secondary | ICD-10-CM | POA: Diagnosis not present

## 2013-07-11 DIAGNOSIS — G620 Drug-induced polyneuropathy: Secondary | ICD-10-CM | POA: Diagnosis not present

## 2013-07-11 DIAGNOSIS — I82409 Acute embolism and thrombosis of unspecified deep veins of unspecified lower extremity: Secondary | ICD-10-CM | POA: Diagnosis not present

## 2013-07-11 DIAGNOSIS — T451X5A Adverse effect of antineoplastic and immunosuppressive drugs, initial encounter: Secondary | ICD-10-CM | POA: Diagnosis not present

## 2013-07-11 DIAGNOSIS — IMO0001 Reserved for inherently not codable concepts without codable children: Secondary | ICD-10-CM | POA: Diagnosis not present

## 2013-07-11 DIAGNOSIS — I872 Venous insufficiency (chronic) (peripheral): Secondary | ICD-10-CM | POA: Diagnosis not present

## 2013-07-11 DIAGNOSIS — L97809 Non-pressure chronic ulcer of other part of unspecified lower leg with unspecified severity: Secondary | ICD-10-CM | POA: Diagnosis not present

## 2013-07-12 DIAGNOSIS — T451X5A Adverse effect of antineoplastic and immunosuppressive drugs, initial encounter: Secondary | ICD-10-CM | POA: Diagnosis not present

## 2013-07-12 DIAGNOSIS — I872 Venous insufficiency (chronic) (peripheral): Secondary | ICD-10-CM | POA: Diagnosis not present

## 2013-07-12 DIAGNOSIS — I82409 Acute embolism and thrombosis of unspecified deep veins of unspecified lower extremity: Secondary | ICD-10-CM | POA: Diagnosis not present

## 2013-07-12 DIAGNOSIS — IMO0001 Reserved for inherently not codable concepts without codable children: Secondary | ICD-10-CM | POA: Diagnosis not present

## 2013-07-12 DIAGNOSIS — L97809 Non-pressure chronic ulcer of other part of unspecified lower leg with unspecified severity: Secondary | ICD-10-CM | POA: Diagnosis not present

## 2013-07-12 DIAGNOSIS — G620 Drug-induced polyneuropathy: Secondary | ICD-10-CM | POA: Diagnosis not present

## 2013-07-13 DIAGNOSIS — L97809 Non-pressure chronic ulcer of other part of unspecified lower leg with unspecified severity: Secondary | ICD-10-CM | POA: Diagnosis not present

## 2013-07-13 DIAGNOSIS — I872 Venous insufficiency (chronic) (peripheral): Secondary | ICD-10-CM | POA: Diagnosis not present

## 2013-07-13 DIAGNOSIS — IMO0001 Reserved for inherently not codable concepts without codable children: Secondary | ICD-10-CM | POA: Diagnosis not present

## 2013-07-13 DIAGNOSIS — T451X5A Adverse effect of antineoplastic and immunosuppressive drugs, initial encounter: Secondary | ICD-10-CM | POA: Diagnosis not present

## 2013-07-13 DIAGNOSIS — I82409 Acute embolism and thrombosis of unspecified deep veins of unspecified lower extremity: Secondary | ICD-10-CM | POA: Diagnosis not present

## 2013-07-13 DIAGNOSIS — G620 Drug-induced polyneuropathy: Secondary | ICD-10-CM | POA: Diagnosis not present

## 2013-07-17 DIAGNOSIS — G620 Drug-induced polyneuropathy: Secondary | ICD-10-CM | POA: Diagnosis not present

## 2013-07-17 DIAGNOSIS — IMO0001 Reserved for inherently not codable concepts without codable children: Secondary | ICD-10-CM | POA: Diagnosis not present

## 2013-07-17 DIAGNOSIS — L97809 Non-pressure chronic ulcer of other part of unspecified lower leg with unspecified severity: Secondary | ICD-10-CM | POA: Diagnosis not present

## 2013-07-17 DIAGNOSIS — I82409 Acute embolism and thrombosis of unspecified deep veins of unspecified lower extremity: Secondary | ICD-10-CM | POA: Diagnosis not present

## 2013-07-17 DIAGNOSIS — T451X5A Adverse effect of antineoplastic and immunosuppressive drugs, initial encounter: Secondary | ICD-10-CM | POA: Diagnosis not present

## 2013-07-17 DIAGNOSIS — I872 Venous insufficiency (chronic) (peripheral): Secondary | ICD-10-CM | POA: Diagnosis not present

## 2013-07-19 DIAGNOSIS — G620 Drug-induced polyneuropathy: Secondary | ICD-10-CM | POA: Diagnosis not present

## 2013-07-19 DIAGNOSIS — T451X5A Adverse effect of antineoplastic and immunosuppressive drugs, initial encounter: Secondary | ICD-10-CM | POA: Diagnosis not present

## 2013-07-19 DIAGNOSIS — I872 Venous insufficiency (chronic) (peripheral): Secondary | ICD-10-CM | POA: Diagnosis not present

## 2013-07-19 DIAGNOSIS — I82409 Acute embolism and thrombosis of unspecified deep veins of unspecified lower extremity: Secondary | ICD-10-CM | POA: Diagnosis not present

## 2013-07-19 DIAGNOSIS — L97809 Non-pressure chronic ulcer of other part of unspecified lower leg with unspecified severity: Secondary | ICD-10-CM | POA: Diagnosis not present

## 2013-07-19 DIAGNOSIS — IMO0001 Reserved for inherently not codable concepts without codable children: Secondary | ICD-10-CM | POA: Diagnosis not present

## 2013-07-20 DIAGNOSIS — IMO0001 Reserved for inherently not codable concepts without codable children: Secondary | ICD-10-CM | POA: Diagnosis not present

## 2013-07-20 DIAGNOSIS — L97809 Non-pressure chronic ulcer of other part of unspecified lower leg with unspecified severity: Secondary | ICD-10-CM | POA: Diagnosis not present

## 2013-07-20 DIAGNOSIS — T451X5A Adverse effect of antineoplastic and immunosuppressive drugs, initial encounter: Secondary | ICD-10-CM | POA: Diagnosis not present

## 2013-07-20 DIAGNOSIS — I872 Venous insufficiency (chronic) (peripheral): Secondary | ICD-10-CM | POA: Diagnosis not present

## 2013-07-20 DIAGNOSIS — I82409 Acute embolism and thrombosis of unspecified deep veins of unspecified lower extremity: Secondary | ICD-10-CM | POA: Diagnosis not present

## 2013-07-20 DIAGNOSIS — G620 Drug-induced polyneuropathy: Secondary | ICD-10-CM | POA: Diagnosis not present

## 2013-07-24 DIAGNOSIS — G620 Drug-induced polyneuropathy: Secondary | ICD-10-CM | POA: Diagnosis not present

## 2013-07-24 DIAGNOSIS — I872 Venous insufficiency (chronic) (peripheral): Secondary | ICD-10-CM | POA: Diagnosis not present

## 2013-07-24 DIAGNOSIS — IMO0001 Reserved for inherently not codable concepts without codable children: Secondary | ICD-10-CM | POA: Diagnosis not present

## 2013-07-24 DIAGNOSIS — T451X5A Adverse effect of antineoplastic and immunosuppressive drugs, initial encounter: Secondary | ICD-10-CM | POA: Diagnosis not present

## 2013-07-24 DIAGNOSIS — I82409 Acute embolism and thrombosis of unspecified deep veins of unspecified lower extremity: Secondary | ICD-10-CM | POA: Diagnosis not present

## 2013-07-24 DIAGNOSIS — L97809 Non-pressure chronic ulcer of other part of unspecified lower leg with unspecified severity: Secondary | ICD-10-CM | POA: Diagnosis not present

## 2013-07-27 DIAGNOSIS — T451X5A Adverse effect of antineoplastic and immunosuppressive drugs, initial encounter: Secondary | ICD-10-CM | POA: Diagnosis not present

## 2013-07-27 DIAGNOSIS — G620 Drug-induced polyneuropathy: Secondary | ICD-10-CM | POA: Diagnosis not present

## 2013-07-27 DIAGNOSIS — IMO0001 Reserved for inherently not codable concepts without codable children: Secondary | ICD-10-CM | POA: Diagnosis not present

## 2013-07-27 DIAGNOSIS — I872 Venous insufficiency (chronic) (peripheral): Secondary | ICD-10-CM | POA: Diagnosis not present

## 2013-07-27 DIAGNOSIS — I82409 Acute embolism and thrombosis of unspecified deep veins of unspecified lower extremity: Secondary | ICD-10-CM | POA: Diagnosis not present

## 2013-07-27 DIAGNOSIS — L97809 Non-pressure chronic ulcer of other part of unspecified lower leg with unspecified severity: Secondary | ICD-10-CM | POA: Diagnosis not present

## 2013-07-30 DIAGNOSIS — I872 Venous insufficiency (chronic) (peripheral): Secondary | ICD-10-CM | POA: Diagnosis not present

## 2013-07-30 DIAGNOSIS — G620 Drug-induced polyneuropathy: Secondary | ICD-10-CM | POA: Diagnosis not present

## 2013-07-30 DIAGNOSIS — L97809 Non-pressure chronic ulcer of other part of unspecified lower leg with unspecified severity: Secondary | ICD-10-CM | POA: Diagnosis not present

## 2013-07-30 DIAGNOSIS — Z7901 Long term (current) use of anticoagulants: Secondary | ICD-10-CM | POA: Diagnosis not present

## 2013-07-30 DIAGNOSIS — IMO0001 Reserved for inherently not codable concepts without codable children: Secondary | ICD-10-CM | POA: Diagnosis not present

## 2013-07-30 DIAGNOSIS — T451X5A Adverse effect of antineoplastic and immunosuppressive drugs, initial encounter: Secondary | ICD-10-CM | POA: Diagnosis not present

## 2013-07-30 DIAGNOSIS — I82409 Acute embolism and thrombosis of unspecified deep veins of unspecified lower extremity: Secondary | ICD-10-CM | POA: Diagnosis not present

## 2013-07-30 LAB — PROTIME-INR: INR: 1.9 — AB (ref 0.9–1.1)

## 2013-07-31 ENCOUNTER — Other Ambulatory Visit: Payer: Self-pay | Admitting: *Deleted

## 2013-07-31 DIAGNOSIS — C50411 Malignant neoplasm of upper-outer quadrant of right female breast: Secondary | ICD-10-CM

## 2013-07-31 DIAGNOSIS — K260 Acute duodenal ulcer with hemorrhage: Secondary | ICD-10-CM | POA: Diagnosis not present

## 2013-07-31 DIAGNOSIS — K5289 Other specified noninfective gastroenteritis and colitis: Secondary | ICD-10-CM | POA: Diagnosis not present

## 2013-07-31 DIAGNOSIS — K219 Gastro-esophageal reflux disease without esophagitis: Secondary | ICD-10-CM | POA: Diagnosis not present

## 2013-08-01 ENCOUNTER — Telehealth: Payer: Self-pay | Admitting: Oncology

## 2013-08-01 ENCOUNTER — Other Ambulatory Visit (HOSPITAL_BASED_OUTPATIENT_CLINIC_OR_DEPARTMENT_OTHER): Payer: Medicare Other

## 2013-08-01 ENCOUNTER — Ambulatory Visit (HOSPITAL_BASED_OUTPATIENT_CLINIC_OR_DEPARTMENT_OTHER): Payer: Medicare Other | Admitting: Oncology

## 2013-08-01 VITALS — BP 129/79 | HR 86 | Temp 97.7°F | Resp 18 | Ht 66.5 in | Wt 153.7 lb

## 2013-08-01 DIAGNOSIS — F411 Generalized anxiety disorder: Secondary | ICD-10-CM | POA: Diagnosis not present

## 2013-08-01 DIAGNOSIS — C50411 Malignant neoplasm of upper-outer quadrant of right female breast: Secondary | ICD-10-CM

## 2013-08-01 DIAGNOSIS — I82409 Acute embolism and thrombosis of unspecified deep veins of unspecified lower extremity: Secondary | ICD-10-CM | POA: Diagnosis not present

## 2013-08-01 DIAGNOSIS — G609 Hereditary and idiopathic neuropathy, unspecified: Secondary | ICD-10-CM

## 2013-08-01 DIAGNOSIS — C50419 Malignant neoplasm of upper-outer quadrant of unspecified female breast: Secondary | ICD-10-CM

## 2013-08-01 DIAGNOSIS — M810 Age-related osteoporosis without current pathological fracture: Secondary | ICD-10-CM

## 2013-08-01 DIAGNOSIS — D0590 Unspecified type of carcinoma in situ of unspecified breast: Secondary | ICD-10-CM | POA: Diagnosis not present

## 2013-08-01 DIAGNOSIS — Z171 Estrogen receptor negative status [ER-]: Secondary | ICD-10-CM

## 2013-08-01 DIAGNOSIS — D0512 Intraductal carcinoma in situ of left breast: Secondary | ICD-10-CM

## 2013-08-01 LAB — CBC WITH DIFFERENTIAL/PLATELET
BASO%: 0.8 % (ref 0.0–2.0)
BASOS ABS: 0.1 10*3/uL (ref 0.0–0.1)
EOS%: 0.1 % (ref 0.0–7.0)
Eosinophils Absolute: 0 10*3/uL (ref 0.0–0.5)
HEMATOCRIT: 39.1 % (ref 34.8–46.6)
HEMOGLOBIN: 12.6 g/dL (ref 11.6–15.9)
LYMPH%: 23.2 % (ref 14.0–49.7)
MCH: 27.5 pg (ref 25.1–34.0)
MCHC: 32.1 g/dL (ref 31.5–36.0)
MCV: 85.5 fL (ref 79.5–101.0)
MONO#: 0.5 10*3/uL (ref 0.1–0.9)
MONO%: 6.1 % (ref 0.0–14.0)
NEUT#: 6.2 10*3/uL (ref 1.5–6.5)
NEUT%: 69.8 % (ref 38.4–76.8)
PLATELETS: 331 10*3/uL (ref 145–400)
RBC: 4.58 10*6/uL (ref 3.70–5.45)
RDW: 16.1 % — ABNORMAL HIGH (ref 11.2–14.5)
WBC: 8.9 10*3/uL (ref 3.9–10.3)
lymph#: 2.1 10*3/uL (ref 0.9–3.3)

## 2013-08-01 LAB — COMPREHENSIVE METABOLIC PANEL (CC13)
ALK PHOS: 111 U/L (ref 40–150)
ALT: 9 U/L (ref 0–55)
AST: 12 U/L (ref 5–34)
Albumin: 2.9 g/dL — ABNORMAL LOW (ref 3.5–5.0)
Anion Gap: 16 mEq/L — ABNORMAL HIGH (ref 3–11)
BUN: 14.7 mg/dL (ref 7.0–26.0)
CALCIUM: 9.1 mg/dL (ref 8.4–10.4)
CO2: 19 mEq/L — ABNORMAL LOW (ref 22–29)
Chloride: 108 mEq/L (ref 98–109)
Creatinine: 0.8 mg/dL (ref 0.6–1.1)
GLUCOSE: 106 mg/dL (ref 70–140)
POTASSIUM: 3.3 meq/L — AB (ref 3.5–5.1)
Sodium: 143 mEq/L (ref 136–145)
Total Bilirubin: 0.34 mg/dL (ref 0.20–1.20)
Total Protein: 6.9 g/dL (ref 6.4–8.3)

## 2013-08-01 LAB — LACTATE DEHYDROGENASE (CC13): LDH: 202 U/L (ref 125–245)

## 2013-08-01 NOTE — Progress Notes (Signed)
[place]  [phone] [phone]  OFFICE PROGRESS NOTE   ID: [name]   DOB: [birth year]  MR#: [id number]  LTR#:[id number]   PCP: Kelsey Stack, MD SU: [name], M.D. RAD ONC: [name], M.D. NEURO:  [name], M.D., [name] M.D., Kelsey Nest MD   HISTORY OF PRESENT ILLNESS: From Dr. Collier Salina [name]'s new patient evaluation note dated 01/05/2010:  "This woman has been in excellent health.  She has undergone annual screening mammography.  She underwent a routine screening mammogram on 12/11/2009, this showed a possible mass, right breast.  The patient was contacted and underwent a diagnostic mammogram and right breast ultrasound on 12/19/2009 at the time of the imaging tests, physical exam did not reveal any abnormalities.  There is focal tenderness at the 10 o' clock, 2 cm from the right nipple.  Ultrasound showed a hypoechoic mass with a microlobulated border at 10 o' clock, 2 cm from the nipple measuring 2 x 1.2 x 1.8 cm.  Ultrasound of that axilla did not reveal abnormal lymph nodes.  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  (The case [id number]).  The estrogen receptors were negative, progesterone receptor was negative, proliferative index 96% and the HER-2 had a ratio of 1.31.  An MRI scan was done 12/29/2009, which showed enhancing 1.8 x 2.3 x 2.4 cm mass in the left and upper quadrant.  No enlarged lymph nodes were seen.  Some sub-centimeter lesions were seen in the liver, likely representing cyst.  The patient has elected to undergo genetic testing and is awaiting those results prior to making a final decision regarding surgical therapy.  In addition, she is here to discuss neoadjuvant therapy.  This patient has a history of DCIS involving the left breast.  This is associated with microinvasion.  This was diagnosed in 1996.  She had left  central lumpectomy and axillary node dissection that was negative.  She was seen by Dr. Noreene [name], and underwent radiation therapy to the breast.  She has not received any adjuvant hormonal therapy.  We do not have any other details regarding the specific pathological diagnosis."    Her subsequent history is as detailed below.   INTERVAL HISTORY: [name] returns today for followup of her breast cancer accompanied by her husband [name]. "It's been a rough year". She was admitted in January with pneumonia, bilateral DVTs, and a GI bleed. All that was gradually taking care of, but it an involved her spending 2-1/2 months in rehabilitation. She is now "better than before". From the point of view of breast cancer she tells me "everything is fine and there". . REVIEW OF SYSTEMS: She has noted a small bump in the right breast right lateral to the areola that she wanted me to look at. She is having moderate diarrhea "all the time". This is not a new symptom. She continues to have pain in the lumbar spine. Her vision is blurred at times. She has chronic sinus problems, difficulty swallowing, hoarseness, and a dry cough. She's developed neuropathy in her feet swell. Her circulation is poor. She has heartburn problems. Sometimes her abdomen cramps and and hurts. Distress urinary incontinence. She bruises easily. She can walk may be 60 yards using a walker chair. Her headaches and seizures are not more persistent or intense than before. She does feel weak, anxious, "fainted", and depressed. However there have not been any recent falls. A detailed review  of systems was otherwise stable.   PAST MEDICAL HISTORY: Past Medical History  Diagnosis Date  . IBS (irritable bowel syndrome)   . Fibromyalgia   . History of DVT (deep vein thrombosis)   . History of pulmonary embolism   . Osteoporosis   . History of breast cancer LEFT BREAST DCIS  1996  . Hyperlipidemia   . Neuropathy due to chemotherapeutic drug  NUMBNESS / TINGLING FEET AND HANDS  . Breast cancer RIGHT SIDE-- DX OCT 2011    CHEMORADIATION THERAPY-- COMPLETED   . PONV (postoperative nausea and vomiting)   . Skin tear LEFT ARM COVERED W/ TEGADERM    PT STATES HX MULTIPLE SKIN TEARS -- THIN  . Thin skin SECONARDY AGE AND HX CHEMO  . Ecchymosis   . Seasonal allergies   . Frequency of urination   . Nocturia   . Anemia   . Lung mass PER DR CLANCE NOTE UNCLEAR IF LYMPHOMA FROM BX    FOLLOWED BY DR [name]  . Seizures   . Unspecified hereditary and idiopathic peripheral neuropathy   . Asthma   . Fibromyalgia   . Vertebral compression fracture 5/14    L4    PAST SURGICAL HISTORY: Past Surgical History  Procedure Laterality Date  . Total abdominal hysterectomy w/ bilateral salpingoophorectomy  1977  (APPROX)  . Cholecystectomy    . Appendectomy    . Femur fracture surgery  12-18-2010  DR BEANE    INTRAMEDULLARY NAILING LEFT INTERTROCHANTERIC -SUBTROCHANTERIC FX  . Right breast lumpectomy / removal lymph nodes x2/ removal pac  10-01-2010    CARCINOMA RIGHT BREAST  . Placement port- a- cath  02-18-2010  . Bronchoscopy  01-29-2010    W/ BX  . Tvt vaginal tape  suburethral sling   06-08-2005    SUI  . Bilateral laminectomy/ foraminotomy  01-14-2004    L4 - L5  . Breast lumpectomy  1996     LEFT BREAST DCIS   . Cataract extraction w/ intraocular lens  implant, bilateral    . Bilateral carpal tunnel release    . Tonsillectomy    . Transthoracic echocardiogram  02-25-2010    LVSF NORMAL/ EF 93-26%/ GRADE I DIASTOLIC DYSFUNCTION/ MILDLY DILATED LEFT ATRIUM  . Hardware removal  10/11/2011    Procedure: HARDWARE REMOVAL;  Surgeon: Kelsey Hai, MD;  Location: [facility];  Service: Orthopedics;  Laterality: Left;  LEFT THIGH REMOVAL OF DISTAL LOCKING SCREW NEEDS: FLORO, RADIO LUSCENT TABLE AND SCREWDRIVER FOR BIOMET ROD   . Colonoscopy    . Esophagogastroduodenoscopy N/A 03/11/2013    Procedure:  ESOPHAGOGASTRODUODENOSCOPY (EGD);  Surgeon: Kelsey Ng, MD;  Location: [facility];  Service: Endoscopy;  Laterality: N/A;  Includes history of hysterectomy 30 years ago with oophorectomy.  This is done by Dr. Maryruth [name] by her report.  She had a large ovarian mass, which was diagnosed as possibly a borderline tumor.  She had a cholecystectomy 20 years ago, appendectomy 50 years ago.  FAMILY HISTORY Family History  Problem Relation Age of Onset  . Emphysema Father   . Ovarian cancer Mother   . Cancer Mother     cervical  . Breast cancer Maternal Grandmother   . Cancer Sister     cervical  Both parents deceased.  Father died about age 56 of unknown causes.  Mother died over 5 years ago, the patient reports she had ovarian cancer as did the patient's sister with both ovarian and breast cancer.  The patient  has a maternal aunt who had ovarian cancer.  Five maternal aunts, one had ovarian cancer and one had breast cancer, a niece had breast cancer.  GYNECOLOGIC HISTORY: Gravida 2, para 2, menarche age 67, menopause at the time of hysterectomy.  She does not recall her last bone density test.  SOCIAL HISTORY: The patient has been married for over 55 years.  She  and her  husband [name] have two adult sons, 5 grandsons, and 2 great grandchildren.  The patient and her husband are both retired. 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  She has family that lives in [place], [place].  Her daughter is a Marine scientist who works for [employer].  In her spare time the patient enjoys gardening, swimming, being with her family, and going to church.   ADVANCED DIRECTIVES: Not on file  HEALTH MAINTENANCE: History  Substance Use Topics  . Smoking status: Former Smoker -- 1.00 packs/day for 30 years    Types: Cigarettes    Quit date: 03/01/1978  . Smokeless tobacco: Never Used  . Alcohol Use: 4.2 oz/week    7 Glasses of wine per week     Colonoscopy:  PAP:  Bone density: The patient's last bone density examination on 06/21/2011 showed a T score of -2.8 (osteoporosis). Lipid panel: Not on file  Allergies  Allergen Reactions  . Penicillins Anaphylaxis  . Adhesive [Tape] Other (See Comments)    Blisters   . Doxycycline Nausea And Vomiting  . Morphine And Related Other (See Comments)    HALLUCINATIONS    Current Outpatient Prescriptions  Medication Sig Dispense Refill  . albuterol (PROVENTIL) (2.5 MG/3ML) 0.083% nebulizer solution Take 3 mLs (2.5 mg total) by nebulization 3 (three) times daily as needed for wheezing or shortness of breath.  75 mL  12  . docusate sodium 100 MG CAPS Take 100 mg by mouth 2 (two) times daily.  10 capsule  0  . DULoxetine (CYMBALTA) 60 MG capsule Take 60 mg by mouth daily after lunch.       . ergocalciferol (VITAMIN D2) 50000 UNITS capsule Take 50,000 Units by mouth once a week. tuesday      . ezetimibe-simvastatin (VYTORIN) 10-80 MG per tablet Take 1 tablet by mouth at bedtime. For hyperlipidemia      . gabapentin (NEURONTIN) 300 MG capsule Take 300 mg by mouth 3 (three) times daily.      Marland Kitchen HYDROmorphone (DILAUDID) 2 MG tablet Take 0.5 tablets (1 mg total) by mouth 2 (two) times daily. Take 1/2 tablet= 1 mg by mouth twice daily for pain  30 tablet  0  . hyoscyamine (LEVBID) 0.375 MG 12 hr tablet Take 1 tablet by mouth every 12 (twelve) hours. For bowel spasms.      Marland Kitchen levETIRAcetam (KEPPRA) 500 MG tablet Take 500 mg by mouth 2 (two) times daily. For seizures      . levothyroxine (SYNTHROID, LEVOTHROID) 88 MCG tablet Take 88 mcg by mouth daily before breakfast. For hypothyroid      . LORazepam (ATIVAN) 0.5 MG tablet Take 1 tablet (0.5 mg total) by mouth every 8 (eight) hours as needed. Anxiety  30 tablet  0  . menthol-cetylpyridinium (CEPACOL) 3 MG lozenge Take 1 lozenge (3 mg total) by mouth as needed for sore throat.  100 tablet  12  . MICRONIZED COLESTIPOL HCL 1 G tablet Take 1 tablet by  mouth 2 (two) times daily. For hyperlipidemia      .  omeprazole (PRILOSEC) 20 MG capsule Take 20 mg by mouth daily.      . polyethylene glycol (MIRALAX / GLYCOLAX) packet Take 17 g by mouth daily as needed for mild constipation.  14 each  0  . potassium chloride SA (K-DUR,KLOR-CON) 20 MEQ tablet Take 2 tablets (40 mEq total) by mouth daily.  1 tablet  0  . warfarin (COUMADIN) 3 MG tablet Take 3 mg by mouth daily at 6 PM.       No current facility-administered medications for this visit.   Facility-Administered Medications Ordered in Other Visits  Medication Dose Route Frequency Provider Last Rate Last Dose  . fentaNYL (SUBLIMAZE) injection 25-50 mcg  25-50 mcg Intravenous Q5 min PRN Peyton Najjar, MD      . lactated ringers infusion   Intravenous Continuous Peyton Najjar, MD         Objective: older white woman who appears stated age  52 Vitals:   08/01/13 1116  BP: 129/79  Pulse: 86  Temp: 97.7 F (36.5 C)  Resp: 18     Body mass index is 24.44 kg/(m^2).      ECOG FS:  2           The patient walks with a "seat walker"  Sclerae are not icteric. EOMs are intact.  Oropharynx  is slightly dry  There is no cervical or supraclavicular adenopathy Lungs  no crackles or wheezes Heart regular rate and rhythm  Abdomen soft, obese, nontender, positive bowel sounds Musculoskeletal exam shows  kyphosis and scoliosis but no focal spinal tenderness. There are chronic venous stasis changes with hemosiderin deposits in both lower extremities, 2+ lower extremity edema Breast exam: Status post right central lumpectomy and radiation. There is slight "bump" she notices is immediately adjacent to the scar and is doubtless scar tissue. There are no skin or nipple changes of concern.. The right axilla is benign. The left breast is unremarkable.  LAB RESULTS: Lab Results  Component Value Date   WBC 8.9 08/01/2013   NEUTROABS 6.2 08/01/2013   HGB 12.6 08/01/2013   HCT 39.1 08/01/2013   MCV 85.5  08/01/2013   PLT 331 08/01/2013      Chemistry      Component Value Date/Time   NA 139 04/17/2013 0425   NA 141 01/04/2013 1330   K 3.6* 04/17/2013 0425   K 3.7 01/04/2013 1330   CL 102 04/17/2013 0425   CL 102 07/04/2012 1456   CO2 25 04/17/2013 0425   CO2 25 01/04/2013 1330   BUN 12 04/17/2013 0425   BUN 18.3 01/04/2013 1330   CREATININE 0.97 04/17/2013 0425   CREATININE 0.8 01/04/2013 1330      Component Value Date/Time   CALCIUM 7.9* 04/17/2013 0425   CALCIUM 9.3 01/04/2013 1330   ALKPHOS 77 04/17/2013 0425   ALKPHOS 139 01/04/2013 1330   AST 20 04/17/2013 0425   AST 17 01/04/2013 1330   ALT 24 04/17/2013 0425   ALT 30 01/04/2013 1330   BILITOT <0.2* 04/17/2013 0425   BILITOT 0.48 01/04/2013 1330       Lab Results  Component Value Date   LABCA2 35 01/05/2010    Urinalysis    Component Value Date/Time   COLORURINE YELLOW 04/10/2013 0114   APPEARANCEUR CLOUDY* 04/10/2013 0114   LABSPEC 1.012 04/10/2013 0114   PHURINE 6.0 04/10/2013 0114   GLUCOSEU NEGATIVE 04/10/2013 0114   HGBUR TRACE* 04/10/2013 0114   BILIRUBINUR NEGATIVE 04/10/2013 0114   KETONESUR NEGATIVE  04/10/2013 0114   PROTEINUR 30* 04/10/2013 0114   UROBILINOGEN 0.2 04/10/2013 0114   NITRITE NEGATIVE 04/10/2013 0114   LEUKOCYTESUR LARGE* 04/10/2013 0114    STUDIES: No results found.  ASSESSMENT: 79 y.o. BRCA negative [place] woman:  1.  History of left breast DCIS in 1996  2.  Status post right needle core biopsy on 12/19/2009 which showed a high-grade invasive ductal carcinoma with papillary features and high-grade DCIS, ER 0%, PR 0%, Ki-67 96%, HER-2/neu negative by CISH with a ratio of 1.31.  3.  Status post bilateral breast MRI on 12/29/2009 which showed an enhancing 1.8 x 2.3 x 2.4 cm mass in the upper outer quadrant of the right breast.  No additional masses were seen in the right breast.  There were no enlarged axillary or internal mammary adenopathy.  No abnormal enhancement was seen in the left breast.  4.  The  patient underwent genetic counseling and testing and had a negative BRCA1 and BRCA2 gene mutation letter sent to her dated 01/09/2010.  5.  Status post right lower lobe pulmonary biopsy on 02/10/2010 that was negative for malignancy.  6.  Status post 2-D echocardiogram on 02/25/2010 which showed an ejection fraction of 55% to 60%.  7.  Status post neoadjuvant chemotherapy with dose dense FEC x 3 cycles from 03/12/2010 through 04/17/2010 with Neulasta support.  8.  Status post neoadjuvant chemotherapy with FEC x 1 dose on 05/08/2010 with Neulasta support.  9.  Status post neoadjuvant chemotherapy with Abraxane x 9 doses from 06/03/2010 through 08/12/2010.  10.  Status post right lumpectomy with sentinel node biopsy 10/01/2010 which showed a stage 0, ypTis ypN0, high-grade 0.45 cm DCIS, ER 0%, PR 0%, margins were negative for carcinoma, 0/2 positive lymph nodes.  12.  Status post radiation therapy from 11/18/2010 through 01/12/2011.  13.  The patient had a PET scan on 03/14/2012 which showed no specific features identified to suggest residual or recurrence of breast cancer, right anterior rib fractures, left maxillary sinus inflammation.  14.  History of hypokalemia  15.  Anxiety  16.  Osteoporosis  17: bullous dermatopathy  18. GIB/ IBS/ chronic diarrhea  19. Neuropathy  20. Bilateral DVTs   PLAN: Sarye has had a rough time in the past 2 years but hopefully things are beginning to improve. From a breast cancer point of view, she is doing just fine.  because there was no invasive disease seen in the definitive surgery after her neoadjuvant treatment, her prognosis is good.   She should have had her mammogram already, but she tells me she will get it done within the next week or 10 days. Accordingly she will see me again in one year, after her mammogram next year, June of 2016.  She has a good understanding of the overall plan. She agrees with it. She knows the goal of treatment  in her case as far as breast cancer is concerned is cure. She will call with any problems that may develop before the next visit. Chauncey Cruel, MD  08/01/2013, 11:31 AM

## 2013-08-01 NOTE — Telephone Encounter (Signed)
per pof to sch pt appt-gave pt copy of sch °

## 2013-08-02 ENCOUNTER — Institutional Professional Consult (permissible substitution): Payer: Medicare Other | Admitting: Cardiovascular Disease

## 2013-08-02 DIAGNOSIS — I82409 Acute embolism and thrombosis of unspecified deep veins of unspecified lower extremity: Secondary | ICD-10-CM | POA: Diagnosis not present

## 2013-08-02 DIAGNOSIS — I872 Venous insufficiency (chronic) (peripheral): Secondary | ICD-10-CM | POA: Diagnosis not present

## 2013-08-02 DIAGNOSIS — L97809 Non-pressure chronic ulcer of other part of unspecified lower leg with unspecified severity: Secondary | ICD-10-CM | POA: Diagnosis not present

## 2013-08-02 DIAGNOSIS — I739 Peripheral vascular disease, unspecified: Secondary | ICD-10-CM | POA: Diagnosis not present

## 2013-08-02 DIAGNOSIS — I4891 Unspecified atrial fibrillation: Secondary | ICD-10-CM | POA: Diagnosis not present

## 2013-08-02 DIAGNOSIS — I10 Essential (primary) hypertension: Secondary | ICD-10-CM | POA: Diagnosis not present

## 2013-08-02 DIAGNOSIS — E785 Hyperlipidemia, unspecified: Secondary | ICD-10-CM | POA: Diagnosis not present

## 2013-08-03 DIAGNOSIS — I739 Peripheral vascular disease, unspecified: Secondary | ICD-10-CM | POA: Diagnosis not present

## 2013-08-03 DIAGNOSIS — I4891 Unspecified atrial fibrillation: Secondary | ICD-10-CM | POA: Diagnosis not present

## 2013-08-03 DIAGNOSIS — I10 Essential (primary) hypertension: Secondary | ICD-10-CM | POA: Diagnosis not present

## 2013-08-03 DIAGNOSIS — I82409 Acute embolism and thrombosis of unspecified deep veins of unspecified lower extremity: Secondary | ICD-10-CM | POA: Diagnosis not present

## 2013-08-03 DIAGNOSIS — L97809 Non-pressure chronic ulcer of other part of unspecified lower leg with unspecified severity: Secondary | ICD-10-CM | POA: Diagnosis not present

## 2013-08-03 DIAGNOSIS — I872 Venous insufficiency (chronic) (peripheral): Secondary | ICD-10-CM | POA: Diagnosis not present

## 2013-08-07 DIAGNOSIS — L97809 Non-pressure chronic ulcer of other part of unspecified lower leg with unspecified severity: Secondary | ICD-10-CM | POA: Diagnosis not present

## 2013-08-07 DIAGNOSIS — I82409 Acute embolism and thrombosis of unspecified deep veins of unspecified lower extremity: Secondary | ICD-10-CM | POA: Diagnosis not present

## 2013-08-07 DIAGNOSIS — I4891 Unspecified atrial fibrillation: Secondary | ICD-10-CM | POA: Diagnosis not present

## 2013-08-07 DIAGNOSIS — I739 Peripheral vascular disease, unspecified: Secondary | ICD-10-CM | POA: Diagnosis not present

## 2013-08-07 DIAGNOSIS — I10 Essential (primary) hypertension: Secondary | ICD-10-CM | POA: Diagnosis not present

## 2013-08-07 DIAGNOSIS — I872 Venous insufficiency (chronic) (peripheral): Secondary | ICD-10-CM | POA: Diagnosis not present

## 2013-08-10 ENCOUNTER — Other Ambulatory Visit: Payer: Self-pay | Admitting: Oncology

## 2013-08-10 DIAGNOSIS — I10 Essential (primary) hypertension: Secondary | ICD-10-CM | POA: Diagnosis not present

## 2013-08-10 DIAGNOSIS — Z853 Personal history of malignant neoplasm of breast: Secondary | ICD-10-CM

## 2013-08-10 DIAGNOSIS — L97809 Non-pressure chronic ulcer of other part of unspecified lower leg with unspecified severity: Secondary | ICD-10-CM | POA: Diagnosis not present

## 2013-08-10 DIAGNOSIS — I739 Peripheral vascular disease, unspecified: Secondary | ICD-10-CM | POA: Diagnosis not present

## 2013-08-10 DIAGNOSIS — I872 Venous insufficiency (chronic) (peripheral): Secondary | ICD-10-CM | POA: Diagnosis not present

## 2013-08-10 DIAGNOSIS — I4891 Unspecified atrial fibrillation: Secondary | ICD-10-CM | POA: Diagnosis not present

## 2013-08-10 DIAGNOSIS — I82409 Acute embolism and thrombosis of unspecified deep veins of unspecified lower extremity: Secondary | ICD-10-CM | POA: Diagnosis not present

## 2013-08-11 ENCOUNTER — Other Ambulatory Visit: Payer: Self-pay | Admitting: Neurology

## 2013-08-13 ENCOUNTER — Other Ambulatory Visit: Payer: Self-pay

## 2013-08-14 DIAGNOSIS — I739 Peripheral vascular disease, unspecified: Secondary | ICD-10-CM | POA: Diagnosis not present

## 2013-08-14 DIAGNOSIS — I4891 Unspecified atrial fibrillation: Secondary | ICD-10-CM | POA: Diagnosis not present

## 2013-08-14 DIAGNOSIS — I872 Venous insufficiency (chronic) (peripheral): Secondary | ICD-10-CM | POA: Diagnosis not present

## 2013-08-14 DIAGNOSIS — I82409 Acute embolism and thrombosis of unspecified deep veins of unspecified lower extremity: Secondary | ICD-10-CM | POA: Diagnosis not present

## 2013-08-14 DIAGNOSIS — L97809 Non-pressure chronic ulcer of other part of unspecified lower leg with unspecified severity: Secondary | ICD-10-CM | POA: Diagnosis not present

## 2013-08-14 DIAGNOSIS — I10 Essential (primary) hypertension: Secondary | ICD-10-CM | POA: Diagnosis not present

## 2013-08-16 DIAGNOSIS — S32009A Unspecified fracture of unspecified lumbar vertebra, initial encounter for closed fracture: Secondary | ICD-10-CM | POA: Diagnosis not present

## 2013-08-16 DIAGNOSIS — M47817 Spondylosis without myelopathy or radiculopathy, lumbosacral region: Secondary | ICD-10-CM | POA: Diagnosis not present

## 2013-08-16 DIAGNOSIS — M76899 Other specified enthesopathies of unspecified lower limb, excluding foot: Secondary | ICD-10-CM | POA: Diagnosis not present

## 2013-08-16 DIAGNOSIS — M810 Age-related osteoporosis without current pathological fracture: Secondary | ICD-10-CM | POA: Diagnosis not present

## 2013-08-16 DIAGNOSIS — G894 Chronic pain syndrome: Secondary | ICD-10-CM | POA: Diagnosis not present

## 2013-08-17 ENCOUNTER — Other Ambulatory Visit: Payer: Self-pay | Admitting: Oncology

## 2013-08-17 ENCOUNTER — Other Ambulatory Visit: Payer: Self-pay

## 2013-08-17 DIAGNOSIS — I4891 Unspecified atrial fibrillation: Secondary | ICD-10-CM | POA: Diagnosis not present

## 2013-08-17 DIAGNOSIS — L97809 Non-pressure chronic ulcer of other part of unspecified lower leg with unspecified severity: Secondary | ICD-10-CM | POA: Diagnosis not present

## 2013-08-17 DIAGNOSIS — I10 Essential (primary) hypertension: Secondary | ICD-10-CM | POA: Diagnosis not present

## 2013-08-17 DIAGNOSIS — I82409 Acute embolism and thrombosis of unspecified deep veins of unspecified lower extremity: Secondary | ICD-10-CM | POA: Diagnosis not present

## 2013-08-17 DIAGNOSIS — Z853 Personal history of malignant neoplasm of breast: Secondary | ICD-10-CM

## 2013-08-17 DIAGNOSIS — I872 Venous insufficiency (chronic) (peripheral): Secondary | ICD-10-CM | POA: Diagnosis not present

## 2013-08-17 DIAGNOSIS — K5289 Other specified noninfective gastroenteritis and colitis: Secondary | ICD-10-CM | POA: Diagnosis not present

## 2013-08-17 DIAGNOSIS — K260 Acute duodenal ulcer with hemorrhage: Secondary | ICD-10-CM | POA: Diagnosis not present

## 2013-08-17 DIAGNOSIS — I739 Peripheral vascular disease, unspecified: Secondary | ICD-10-CM | POA: Diagnosis not present

## 2013-08-21 DIAGNOSIS — I4891 Unspecified atrial fibrillation: Secondary | ICD-10-CM | POA: Diagnosis not present

## 2013-08-21 DIAGNOSIS — I739 Peripheral vascular disease, unspecified: Secondary | ICD-10-CM | POA: Diagnosis not present

## 2013-08-21 DIAGNOSIS — I82409 Acute embolism and thrombosis of unspecified deep veins of unspecified lower extremity: Secondary | ICD-10-CM | POA: Diagnosis not present

## 2013-08-21 DIAGNOSIS — I10 Essential (primary) hypertension: Secondary | ICD-10-CM | POA: Diagnosis not present

## 2013-08-21 DIAGNOSIS — L97809 Non-pressure chronic ulcer of other part of unspecified lower leg with unspecified severity: Secondary | ICD-10-CM | POA: Diagnosis not present

## 2013-08-21 DIAGNOSIS — I872 Venous insufficiency (chronic) (peripheral): Secondary | ICD-10-CM | POA: Diagnosis not present

## 2013-08-22 ENCOUNTER — Ambulatory Visit (INDEPENDENT_AMBULATORY_CARE_PROVIDER_SITE_OTHER): Payer: Medicare Other | Admitting: Internal Medicine

## 2013-08-22 ENCOUNTER — Encounter: Payer: Self-pay | Admitting: Internal Medicine

## 2013-08-22 VITALS — BP 161/89 | HR 79 | Ht 66.5 in | Wt 158.0 lb

## 2013-08-22 DIAGNOSIS — R002 Palpitations: Secondary | ICD-10-CM | POA: Insufficient documentation

## 2013-08-22 NOTE — Progress Notes (Signed)
HPI [name] is referred today by Dr. Forde Dandy for evaluation of palpitations. I could not find any documented arrhythmias except for NSVT on tele monitoring. She has dizzines which at times sounds more like orthostasis. She notes that she has some degree of palpitations almost daily. Some days are worse then others. No clear cut heart racing. She denies frank syncope. She has had an extensive history of DVT's and GI bleeding. She also appears to have severe venous insufficiency.  Allergies  Allergen Reactions  . Penicillins Anaphylaxis  . Adhesive [Tape] Other (See Comments)    Blisters   . Doxycycline Nausea And Vomiting  . Morphine And Related Other (See Comments)    HALLUCINATIONS     Current Outpatient Prescriptions  Medication Sig Dispense Refill  . dexlansoprazole (DEXILANT) 60 MG capsule Take 60 mg by mouth as needed.       . DULoxetine (CYMBALTA) 60 MG capsule Take 60 mg by mouth daily after lunch.       . ergocalciferol (VITAMIN D2) 50000 UNITS capsule Take 50,000 Units by mouth once a week. tuesday      . ezetimibe-simvastatin (VYTORIN) 10-80 MG per tablet Take 1 tablet by mouth at bedtime. For hyperlipidemia      . gabapentin (NEURONTIN) 100 MG capsule Take 100 mg by mouth 3 (three) times daily. May titrate up to 3 capsules three times a day      . HYDROmorphone (DILAUDID) 2 MG tablet Take 2 mg by mouth every 6 (six) hours as needed.      . hyoscyamine (LEVBID) 0.375 MG 12 hr tablet Take 1 tablet by mouth daily. For bowel spasms.      Marland Kitchen levETIRAcetam (KEPPRA) 250 MG tablet Take 250 mg by mouth 2 (two) times daily.      Marland Kitchen levothyroxine (SYNTHROID, LEVOTHROID) 88 MCG tablet Take 88 mcg by mouth daily before breakfast. For hypothyroid      . LORazepam (ATIVAN) 0.5 MG tablet Take 0.5 mg by mouth daily as needed. Anxiety      . potassium chloride SA (K-DUR,KLOR-CON) 20 MEQ tablet Take 20 mEq by mouth daily.      . predniSONE (DELTASONE) 5 MG tablet Take 5 mg by mouth  daily.      Marland Kitchen UCERIS 9 MG TB24 Take 3 capsules by mouth daily.      Marland Kitchen warfarin (COUMADIN) 4 MG tablet Take 4 mg by mouth daily.       No current facility-administered medications for this visit.   Facility-Administered Medications Ordered in Other Visits  Medication Dose Route Frequency Provider Last Rate Last Dose  . fentaNYL (SUBLIMAZE) injection 25-50 mcg  25-50 mcg Intravenous Q5 min PRN Peyton Najjar, MD      . lactated ringers infusion   Intravenous Continuous Peyton Najjar, MD         Past Medical History  Diagnosis Date  . IBS (irritable bowel syndrome)   . Fibromyalgia   . History of DVT (deep vein thrombosis)   . History of pulmonary embolism   . Osteoporosis   . History of breast cancer LEFT BREAST DCIS  1996  . Hyperlipidemia   . 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 Breast cancer RIGHT SIDE-- DX OCT 2011    CHEMORADIATION THERAPY-- COMPLETED   . PONV (postoperative nausea and vomiting)   . Skin tear LEFT ARM COVERED W/ TEGADERM    PT STATES HX MULTIPLE SKIN TEARS --  THIN  . Thin skin SECONARDY AGE AND HX CHEMO  . Ecchymosis   . Seasonal allergies   . Frequency of urination   . Nocturia   . Anemia   . Lung mass PER DR CLANCE NOTE UNCLEAR IF LYMPHOMA FROM BX    FOLLOWED BY DR RUBIN  . Seizures   . Unspecified hereditary and idiopathic peripheral neuropathy   . Asthma   . Fibromyalgia   . Vertebral compression fracture 5/14    L4    ROS:   All systems reviewed and negative except as noted in the HPI.   Past Surgical History  Procedure Laterality Date  . Total abdominal hysterectomy w/ bilateral salpingoophorectomy  1977  (APPROX)  . Cholecystectomy    . Appendectomy    . Femur fracture surgery  12-18-2010  DR BEANE    INTRAMEDULLARY NAILING LEFT INTERTROCHANTERIC -SUBTROCHANTERIC FX  . Right breast lumpectomy / removal lymph nodes x2/ removal pac  10-01-2010    CARCINOMA RIGHT BREAST  . Placement port- a-  cath  02-18-2010  . Bronchoscopy  01-29-2010    W/ BX  . Tvt vaginal tape  suburethral sling   06-08-2005    SUI  . Bilateral laminectomy/ foraminotomy  01-14-2004    L4 - L5  . Breast lumpectomy  1996     LEFT BREAST DCIS   . Cataract extraction w/ intraocular lens  implant, bilateral    . Bilateral carpal tunnel release    . Tonsillectomy    . Transthoracic echocardiogram  02-25-2010    LVSF NORMAL/ EF 62-69%/ GRADE I DIASTOLIC DYSFUNCTION/ MILDLY DILATED LEFT ATRIUM  . Hardware removal  10/11/2011    Procedure: HARDWARE REMOVAL;  Surgeon: Johnn Hai, MD;  Location: [facility];  Service: Orthopedics;  Laterality: Left;  LEFT THIGH REMOVAL OF DISTAL LOCKING SCREW NEEDS: FLORO, RADIO LUSCENT TABLE AND SCREWDRIVER FOR BIOMET ROD   . Colonoscopy    . Esophagogastroduodenoscopy N/A 03/11/2013    Procedure: ESOPHAGOGASTRODUODENOSCOPY (EGD);  Surgeon: Lear Ng, MD;  Location: [facility];  Service: Endoscopy;  Laterality: N/A;     Family History  Problem Relation Age of Onset  . Emphysema Father   . CAD Father   . Ovarian cancer Mother   . Cancer Mother     cervical  . Alzheimer's disease Mother   . Breast cancer Maternal Grandmother   . Cancer Sister     cervical     History   Social History  . Marital Status: Married    Spouse Name: N/A    Number of Children: N/A  . Years of Education: N/A   Occupational History  . retired    Social History Main Topics  . Smoking status: Former Smoker -- 1.00 packs/day for 30 years    Types: Cigarettes    Quit date: 03/01/1978  . Smokeless tobacco: Never Used  . Alcohol Use: 4.2 oz/week    7 Glasses of wine per week  . Drug Use: No  . Sexual Activity: No   Other Topics Concern  . Not on file   Social History Narrative  . No narrative on file     BP 161/89  Pulse 79  Ht 5' 6.5" (1.689 m)  Wt 158 lb (71.668 kg)  BMI 25.12 kg/m2  Physical Exam:  Chronically ill appearing woman,  NAD HEENT: Unremarkable Neck:  No JVD, no thyromegally Back:  No CVA tenderness Lungs:  Clear with no wheezes. HEART:  Regular rate rhythm, no  murmurs, no rubs, no clicks Abd:  soft, positive bowel sounds, no organomegally, no rebound, no guarding Ext:  2 plus pulses, no edema, no cyanosis, no clubbing Skin:  No rashes no nodules Neuro:  CN II through XII intact, motor grossly intact  EKG - nsr   Assess/Plan:

## 2013-08-22 NOTE — Patient Instructions (Signed)
Your physician has recommended that you wear a 48 HOUR holter monitor. Holter monitors are medical devices that record the heart's electrical activity. Doctors most often use these monitors to diagnose arrhythmias. Arrhythmias are problems with the speed or rhythm of the heartbeat. The monitor is a small, portable device. You can wear one while you do your normal daily activities. This is usually used to diagnose what is causing palpitations/syncope (passing out).  YOU WILL NEED TO FOLLOW UP WITH DR. Lovena Le A FEW WEEKS AFTER YOUR MONITOR IS DONE

## 2013-08-22 NOTE — Assessment & Plan Note (Signed)
The mechanism of her palpitations is unclear. I have recommended she undergo a 48 hour holter monitor. Medical therapy will be based on the results of her monitor. When patients have a lot of atrial ectopy or PAF, flecainide might be an option.

## 2013-08-23 ENCOUNTER — Ambulatory Visit
Admission: RE | Admit: 2013-08-23 | Discharge: 2013-08-23 | Disposition: A | Discharge status: Home / Self Care | Payer: Medicare Other | Source: Ambulatory Visit | Attending: Oncology | Admitting: Oncology

## 2013-08-23 DIAGNOSIS — Z853 Personal history of malignant neoplasm of breast: Secondary | ICD-10-CM

## 2013-08-23 DIAGNOSIS — R928 Other abnormal and inconclusive findings on diagnostic imaging of breast: Secondary | ICD-10-CM | POA: Diagnosis not present

## 2013-08-24 DIAGNOSIS — L97809 Non-pressure chronic ulcer of other part of unspecified lower leg with unspecified severity: Secondary | ICD-10-CM | POA: Diagnosis not present

## 2013-08-24 DIAGNOSIS — I10 Essential (primary) hypertension: Secondary | ICD-10-CM | POA: Diagnosis not present

## 2013-08-24 DIAGNOSIS — I872 Venous insufficiency (chronic) (peripheral): Secondary | ICD-10-CM | POA: Diagnosis not present

## 2013-08-24 DIAGNOSIS — I82409 Acute embolism and thrombosis of unspecified deep veins of unspecified lower extremity: Secondary | ICD-10-CM | POA: Diagnosis not present

## 2013-08-24 DIAGNOSIS — I4891 Unspecified atrial fibrillation: Secondary | ICD-10-CM | POA: Diagnosis not present

## 2013-08-24 DIAGNOSIS — I739 Peripheral vascular disease, unspecified: Secondary | ICD-10-CM | POA: Diagnosis not present

## 2013-08-28 DIAGNOSIS — I739 Peripheral vascular disease, unspecified: Secondary | ICD-10-CM | POA: Diagnosis not present

## 2013-08-28 DIAGNOSIS — I4891 Unspecified atrial fibrillation: Secondary | ICD-10-CM | POA: Diagnosis not present

## 2013-08-28 DIAGNOSIS — I10 Essential (primary) hypertension: Secondary | ICD-10-CM | POA: Diagnosis not present

## 2013-08-28 DIAGNOSIS — I872 Venous insufficiency (chronic) (peripheral): Secondary | ICD-10-CM | POA: Diagnosis not present

## 2013-08-28 DIAGNOSIS — I82409 Acute embolism and thrombosis of unspecified deep veins of unspecified lower extremity: Secondary | ICD-10-CM | POA: Diagnosis not present

## 2013-08-28 DIAGNOSIS — L97809 Non-pressure chronic ulcer of other part of unspecified lower leg with unspecified severity: Secondary | ICD-10-CM | POA: Diagnosis not present

## 2013-08-29 ENCOUNTER — Encounter: Payer: Self-pay | Admitting: *Deleted

## 2013-08-29 ENCOUNTER — Encounter (INDEPENDENT_AMBULATORY_CARE_PROVIDER_SITE_OTHER): Payer: Medicare Other

## 2013-08-29 DIAGNOSIS — R002 Palpitations: Secondary | ICD-10-CM

## 2013-08-29 NOTE — Progress Notes (Signed)
Patient ID: [name], female   DOB: 09/20/1934, 79 y.o.   MRN: [id number] EVO 48 hour holter monitor applied to patient.

## 2013-08-30 DIAGNOSIS — I82409 Acute embolism and thrombosis of unspecified deep veins of unspecified lower extremity: Secondary | ICD-10-CM | POA: Diagnosis not present

## 2013-08-30 DIAGNOSIS — I739 Peripheral vascular disease, unspecified: Secondary | ICD-10-CM | POA: Diagnosis not present

## 2013-08-30 DIAGNOSIS — I872 Venous insufficiency (chronic) (peripheral): Secondary | ICD-10-CM | POA: Diagnosis not present

## 2013-08-30 DIAGNOSIS — L97809 Non-pressure chronic ulcer of other part of unspecified lower leg with unspecified severity: Secondary | ICD-10-CM | POA: Diagnosis not present

## 2013-08-30 DIAGNOSIS — Z7901 Long term (current) use of anticoagulants: Secondary | ICD-10-CM | POA: Diagnosis not present

## 2013-08-30 DIAGNOSIS — I10 Essential (primary) hypertension: Secondary | ICD-10-CM | POA: Diagnosis not present

## 2013-08-30 DIAGNOSIS — I4891 Unspecified atrial fibrillation: Secondary | ICD-10-CM | POA: Diagnosis not present

## 2013-09-04 ENCOUNTER — Ambulatory Visit: Payer: Medicare Other | Admitting: Internal Medicine

## 2013-09-05 DIAGNOSIS — I872 Venous insufficiency (chronic) (peripheral): Secondary | ICD-10-CM | POA: Diagnosis not present

## 2013-09-05 DIAGNOSIS — I4891 Unspecified atrial fibrillation: Secondary | ICD-10-CM | POA: Diagnosis not present

## 2013-09-05 DIAGNOSIS — I82409 Acute embolism and thrombosis of unspecified deep veins of unspecified lower extremity: Secondary | ICD-10-CM | POA: Diagnosis not present

## 2013-09-05 DIAGNOSIS — I739 Peripheral vascular disease, unspecified: Secondary | ICD-10-CM | POA: Diagnosis not present

## 2013-09-05 DIAGNOSIS — L97809 Non-pressure chronic ulcer of other part of unspecified lower leg with unspecified severity: Secondary | ICD-10-CM | POA: Diagnosis not present

## 2013-09-05 DIAGNOSIS — I10 Essential (primary) hypertension: Secondary | ICD-10-CM | POA: Diagnosis not present

## 2013-09-07 DIAGNOSIS — C50919 Malignant neoplasm of unspecified site of unspecified female breast: Secondary | ICD-10-CM | POA: Diagnosis not present

## 2013-09-07 DIAGNOSIS — E041 Nontoxic single thyroid nodule: Secondary | ICD-10-CM | POA: Diagnosis not present

## 2013-09-07 DIAGNOSIS — I10 Essential (primary) hypertension: Secondary | ICD-10-CM | POA: Diagnosis not present

## 2013-09-07 DIAGNOSIS — E559 Vitamin D deficiency, unspecified: Secondary | ICD-10-CM | POA: Diagnosis not present

## 2013-09-07 DIAGNOSIS — E785 Hyperlipidemia, unspecified: Secondary | ICD-10-CM | POA: Diagnosis not present

## 2013-09-07 DIAGNOSIS — I4891 Unspecified atrial fibrillation: Secondary | ICD-10-CM | POA: Diagnosis not present

## 2013-09-07 DIAGNOSIS — I82409 Acute embolism and thrombosis of unspecified deep veins of unspecified lower extremity: Secondary | ICD-10-CM | POA: Diagnosis not present

## 2013-09-07 DIAGNOSIS — I872 Venous insufficiency (chronic) (peripheral): Secondary | ICD-10-CM | POA: Diagnosis not present

## 2013-09-07 DIAGNOSIS — I739 Peripheral vascular disease, unspecified: Secondary | ICD-10-CM | POA: Diagnosis not present

## 2013-09-07 DIAGNOSIS — G609 Hereditary and idiopathic neuropathy, unspecified: Secondary | ICD-10-CM | POA: Diagnosis not present

## 2013-09-07 DIAGNOSIS — R609 Edema, unspecified: Secondary | ICD-10-CM | POA: Diagnosis not present

## 2013-09-07 DIAGNOSIS — E2749 Other adrenocortical insufficiency: Secondary | ICD-10-CM | POA: Diagnosis not present

## 2013-09-07 DIAGNOSIS — L97809 Non-pressure chronic ulcer of other part of unspecified lower leg with unspecified severity: Secondary | ICD-10-CM | POA: Diagnosis not present

## 2013-09-10 DIAGNOSIS — I82409 Acute embolism and thrombosis of unspecified deep veins of unspecified lower extremity: Secondary | ICD-10-CM | POA: Diagnosis not present

## 2013-09-10 DIAGNOSIS — I872 Venous insufficiency (chronic) (peripheral): Secondary | ICD-10-CM | POA: Diagnosis not present

## 2013-09-10 DIAGNOSIS — I4891 Unspecified atrial fibrillation: Secondary | ICD-10-CM | POA: Diagnosis not present

## 2013-09-10 DIAGNOSIS — I10 Essential (primary) hypertension: Secondary | ICD-10-CM | POA: Diagnosis not present

## 2013-09-10 DIAGNOSIS — I739 Peripheral vascular disease, unspecified: Secondary | ICD-10-CM | POA: Diagnosis not present

## 2013-09-10 DIAGNOSIS — L97809 Non-pressure chronic ulcer of other part of unspecified lower leg with unspecified severity: Secondary | ICD-10-CM | POA: Diagnosis not present

## 2013-09-11 ENCOUNTER — Ambulatory Visit (INDEPENDENT_AMBULATORY_CARE_PROVIDER_SITE_OTHER): Payer: Medicare Other | Admitting: Internal Medicine

## 2013-09-11 ENCOUNTER — Encounter: Payer: Self-pay | Admitting: Internal Medicine

## 2013-09-11 VITALS — BP 140/64 | HR 68 | Ht 66.0 in | Wt 159.0 lb

## 2013-09-11 DIAGNOSIS — I471 Supraventricular tachycardia: Secondary | ICD-10-CM

## 2013-09-11 NOTE — Progress Notes (Signed)
HPI [name] returns today for followup. She is a pleasant 79 yo woman with a h/o palpitations, dyslipidemia, DVT's, HTN and recently wore a cardiac monitor. This demonstrated runs of PAC's and non-sustained atrial tachycardia. Also rare PVC's were noted. In the interim, she has been under increased stress as her husband has been diagnosed with CA. No syncope. Allergies  Allergen Reactions  . Penicillins Anaphylaxis  . Adhesive [Tape] Other (See Comments)    Blisters   . Doxycycline Nausea And Vomiting  . Morphine And Related Other (See Comments)    HALLUCINATIONS     Current Outpatient Prescriptions  Medication Sig Dispense Refill  . dexlansoprazole (DEXILANT) 60 MG capsule Take 60 mg by mouth as needed.       . DULoxetine (CYMBALTA) 60 MG capsule Take 60 mg by mouth daily after lunch.       . ergocalciferol (VITAMIN D2) 50000 UNITS capsule Take 50,000 Units by mouth once a week. tuesday      . ezetimibe-simvastatin (VYTORIN) 10-80 MG per tablet Take 1 tablet by mouth at bedtime. For hyperlipidemia      . gabapentin (NEURONTIN) 100 MG capsule Take 100 mg by mouth 3 (three) times daily. May titrate up to 3 capsules three times a day      . HYDROmorphone (DILAUDID) 2 MG tablet Take 2 mg by mouth every 6 (six) hours as needed.      . hyoscyamine (LEVBID) 0.375 MG 12 hr tablet Take 1 tablet by mouth daily. For bowel spasms.      Marland Kitchen levETIRAcetam (KEPPRA) 250 MG tablet Take 250 mg by mouth 2 (two) times daily.      Marland Kitchen levothyroxine (SYNTHROID, LEVOTHROID) 88 MCG tablet Take 88 mcg by mouth daily before breakfast. For hypothyroid      . LORazepam (ATIVAN) 0.5 MG tablet Take 0.5 mg by mouth daily as needed. Anxiety      . potassium chloride SA (K-DUR,KLOR-CON) 20 MEQ tablet Take 20 mEq by mouth daily.      . predniSONE (DELTASONE) 5 MG tablet Take 5 mg by mouth daily.      Marland Kitchen UCERIS 9 MG TB24 Take 3 capsules by mouth daily.      Marland Kitchen warfarin (COUMADIN) 4 MG tablet Take 4 mg by mouth  daily.       No current facility-administered medications for this visit.   Facility-Administered Medications Ordered in Other Visits  Medication Dose Route Frequency Provider Last Rate Last Dose  . fentaNYL (SUBLIMAZE) injection 25-50 mcg  25-50 mcg Intravenous Q5 min PRN Peyton Najjar, MD      . lactated ringers infusion   Intravenous Continuous Peyton Najjar, MD         Past Medical History  Diagnosis Date  . IBS (irritable bowel syndrome)   . Fibromyalgia   . History of DVT (deep vein thrombosis)   . History of pulmonary embolism   . Osteoporosis   . History of breast cancer LEFT BREAST DCIS  1996  . Hyperlipidemia   . 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 Breast cancer RIGHT SIDE-- DX OCT 2011    CHEMORADIATION THERAPY-- COMPLETED   . PONV (postoperative nausea and vomiting)   . Skin tear LEFT ARM COVERED W/ TEGADERM    PT STATES HX MULTIPLE SKIN TEARS -- THIN  . Thin skin SECONARDY AGE AND HX CHEMO  . Ecchymosis   . Seasonal allergies   .  Frequency of urination   . Nocturia   . Anemia   . Lung mass PER DR CLANCE NOTE UNCLEAR IF LYMPHOMA FROM BX    FOLLOWED BY DR RUBIN  . Seizures   . Unspecified hereditary and idiopathic peripheral neuropathy   . Asthma   . Fibromyalgia   . Vertebral compression fracture 5/14    L4    ROS:   All systems reviewed and negative except as noted in the HPI.   Past Surgical History  Procedure Laterality Date  . Total abdominal hysterectomy w/ bilateral salpingoophorectomy  1977  (APPROX)  . Cholecystectomy    . Appendectomy    . Femur fracture surgery  12-18-2010  DR BEANE    INTRAMEDULLARY NAILING LEFT INTERTROCHANTERIC -SUBTROCHANTERIC FX  . Right breast lumpectomy / removal lymph nodes x2/ removal pac  10-01-2010    CARCINOMA RIGHT BREAST  . Placement port- a- cath  02-18-2010  . Bronchoscopy  01-29-2010    W/ BX  . Tvt vaginal tape  suburethral sling   06-08-2005    SUI  .  Bilateral laminectomy/ foraminotomy  01-14-2004    L4 - L5  . Breast lumpectomy  1996     LEFT BREAST DCIS   . Cataract extraction w/ intraocular lens  implant, bilateral    . Bilateral carpal tunnel release    . Tonsillectomy    . Transthoracic echocardiogram  02-25-2010    LVSF NORMAL/ EF 70-26%/ GRADE I DIASTOLIC DYSFUNCTION/ MILDLY DILATED LEFT ATRIUM  . Hardware removal  10/11/2011    Procedure: HARDWARE REMOVAL;  Surgeon: Johnn Hai, MD;  Location: [facility];  Service: Orthopedics;  Laterality: Left;  LEFT THIGH REMOVAL OF DISTAL LOCKING SCREW NEEDS: FLORO, RADIO LUSCENT TABLE AND SCREWDRIVER FOR BIOMET ROD   . Colonoscopy    . Esophagogastroduodenoscopy N/A 03/11/2013    Procedure: ESOPHAGOGASTRODUODENOSCOPY (EGD);  Surgeon: Lear Ng, MD;  Location: [facility];  Service: Endoscopy;  Laterality: N/A;     Family History  Problem Relation Age of Onset  . Emphysema Father   . CAD Father   . Ovarian cancer Mother   . Cancer Mother     cervical  . Alzheimer's disease Mother   . Breast cancer Maternal Grandmother   . Cancer Sister     cervical     History   Social History  . Marital Status: Married    Spouse Name: N/A    Number of Children: N/A  . Years of Education: N/A   Occupational History  . retired    Social History Main Topics  . Smoking status: Former Smoker -- 1.00 packs/day for 30 years    Types: Cigarettes    Quit date: 03/01/1978  . Smokeless tobacco: Never Used  . Alcohol Use: 4.2 oz/week    7 Glasses of wine per week  . Drug Use: No  . Sexual Activity: No   Other Topics Concern  . Not on file   Social History Narrative  . No narrative on file     BP 140/64  Pulse 68  Ht 5\' 6"  (1.676 m)  Wt 159 lb (72.122 kg)  BMI 25.68 kg/m2  Physical Exam:  Chronically ill appearing 79 yo woman, NAD HEENT: Unremarkable Neck:  7 cm JVD, no thyromegally Back:  No CVA tenderness Lungs:  Clear with no wheezes HEART:   Regular rate rhythm, no murmurs, no rubs, no clicks Abd:  soft, positive bowel sounds, no organomegally, no rebound, no guarding Ext:  2 plus pulses, no edema, no cyanosis, no clubbing Skin:  No rashes no nodules Neuro:  CN II through XII intact, motor grossly intact  EKG - nsr  Holter monitor - runs of NSSVT, PAC's and PVC's.  Assess/Plan:

## 2013-09-11 NOTE — Assessment & Plan Note (Signed)
Her symptoms are non-sustained. I discussed the treatment options and as she is not particularly symptomatic, I have recommended watchful waiting and no anti-arrhythmic meds. I will see her back on an as needed basis and asked that she minimize her caffeine and ETOH consumption and maximize getting plenty of rest.

## 2013-09-11 NOTE — Patient Instructions (Signed)
Your physician recommends that you schedule a follow-up appointment as needed  

## 2013-09-13 DIAGNOSIS — I872 Venous insufficiency (chronic) (peripheral): Secondary | ICD-10-CM | POA: Diagnosis not present

## 2013-09-13 DIAGNOSIS — M47817 Spondylosis without myelopathy or radiculopathy, lumbosacral region: Secondary | ICD-10-CM | POA: Diagnosis not present

## 2013-09-13 DIAGNOSIS — L97809 Non-pressure chronic ulcer of other part of unspecified lower leg with unspecified severity: Secondary | ICD-10-CM | POA: Diagnosis not present

## 2013-09-13 DIAGNOSIS — I739 Peripheral vascular disease, unspecified: Secondary | ICD-10-CM | POA: Diagnosis not present

## 2013-09-13 DIAGNOSIS — Z79899 Other long term (current) drug therapy: Secondary | ICD-10-CM | POA: Diagnosis not present

## 2013-09-13 DIAGNOSIS — G894 Chronic pain syndrome: Secondary | ICD-10-CM | POA: Diagnosis not present

## 2013-09-13 DIAGNOSIS — I4891 Unspecified atrial fibrillation: Secondary | ICD-10-CM | POA: Diagnosis not present

## 2013-09-13 DIAGNOSIS — S32009A Unspecified fracture of unspecified lumbar vertebra, initial encounter for closed fracture: Secondary | ICD-10-CM | POA: Diagnosis not present

## 2013-09-13 DIAGNOSIS — I82409 Acute embolism and thrombosis of unspecified deep veins of unspecified lower extremity: Secondary | ICD-10-CM | POA: Diagnosis not present

## 2013-09-13 DIAGNOSIS — I10 Essential (primary) hypertension: Secondary | ICD-10-CM | POA: Diagnosis not present

## 2013-09-13 DIAGNOSIS — M810 Age-related osteoporosis without current pathological fracture: Secondary | ICD-10-CM | POA: Diagnosis not present

## 2013-09-17 DIAGNOSIS — I872 Venous insufficiency (chronic) (peripheral): Secondary | ICD-10-CM | POA: Diagnosis not present

## 2013-09-17 DIAGNOSIS — I739 Peripheral vascular disease, unspecified: Secondary | ICD-10-CM | POA: Diagnosis not present

## 2013-09-17 DIAGNOSIS — L97809 Non-pressure chronic ulcer of other part of unspecified lower leg with unspecified severity: Secondary | ICD-10-CM | POA: Diagnosis not present

## 2013-09-17 DIAGNOSIS — I4891 Unspecified atrial fibrillation: Secondary | ICD-10-CM | POA: Diagnosis not present

## 2013-09-17 DIAGNOSIS — I10 Essential (primary) hypertension: Secondary | ICD-10-CM | POA: Diagnosis not present

## 2013-09-17 DIAGNOSIS — I82409 Acute embolism and thrombosis of unspecified deep veins of unspecified lower extremity: Secondary | ICD-10-CM | POA: Diagnosis not present

## 2013-09-19 DIAGNOSIS — I82409 Acute embolism and thrombosis of unspecified deep veins of unspecified lower extremity: Secondary | ICD-10-CM | POA: Diagnosis not present

## 2013-09-19 DIAGNOSIS — I739 Peripheral vascular disease, unspecified: Secondary | ICD-10-CM | POA: Diagnosis not present

## 2013-09-19 DIAGNOSIS — I872 Venous insufficiency (chronic) (peripheral): Secondary | ICD-10-CM | POA: Diagnosis not present

## 2013-09-19 DIAGNOSIS — I10 Essential (primary) hypertension: Secondary | ICD-10-CM | POA: Diagnosis not present

## 2013-09-19 DIAGNOSIS — L97809 Non-pressure chronic ulcer of other part of unspecified lower leg with unspecified severity: Secondary | ICD-10-CM | POA: Diagnosis not present

## 2013-09-19 DIAGNOSIS — I4891 Unspecified atrial fibrillation: Secondary | ICD-10-CM | POA: Diagnosis not present

## 2013-09-24 DIAGNOSIS — I739 Peripheral vascular disease, unspecified: Secondary | ICD-10-CM | POA: Diagnosis not present

## 2013-09-24 DIAGNOSIS — L97809 Non-pressure chronic ulcer of other part of unspecified lower leg with unspecified severity: Secondary | ICD-10-CM | POA: Diagnosis not present

## 2013-09-24 DIAGNOSIS — I872 Venous insufficiency (chronic) (peripheral): Secondary | ICD-10-CM | POA: Diagnosis not present

## 2013-09-24 DIAGNOSIS — I82409 Acute embolism and thrombosis of unspecified deep veins of unspecified lower extremity: Secondary | ICD-10-CM | POA: Diagnosis not present

## 2013-09-24 DIAGNOSIS — I4891 Unspecified atrial fibrillation: Secondary | ICD-10-CM | POA: Diagnosis not present

## 2013-09-24 DIAGNOSIS — I10 Essential (primary) hypertension: Secondary | ICD-10-CM | POA: Diagnosis not present

## 2013-09-27 DIAGNOSIS — Z7901 Long term (current) use of anticoagulants: Secondary | ICD-10-CM | POA: Diagnosis not present

## 2013-09-27 DIAGNOSIS — I82409 Acute embolism and thrombosis of unspecified deep veins of unspecified lower extremity: Secondary | ICD-10-CM | POA: Diagnosis not present

## 2013-09-27 DIAGNOSIS — I872 Venous insufficiency (chronic) (peripheral): Secondary | ICD-10-CM | POA: Diagnosis not present

## 2013-09-27 DIAGNOSIS — I4891 Unspecified atrial fibrillation: Secondary | ICD-10-CM | POA: Diagnosis not present

## 2013-09-27 DIAGNOSIS — L97809 Non-pressure chronic ulcer of other part of unspecified lower leg with unspecified severity: Secondary | ICD-10-CM | POA: Diagnosis not present

## 2013-09-27 DIAGNOSIS — I10 Essential (primary) hypertension: Secondary | ICD-10-CM | POA: Diagnosis not present

## 2013-09-27 DIAGNOSIS — I739 Peripheral vascular disease, unspecified: Secondary | ICD-10-CM | POA: Diagnosis not present

## 2013-10-01 DIAGNOSIS — Z7901 Long term (current) use of anticoagulants: Secondary | ICD-10-CM | POA: Diagnosis not present

## 2013-10-01 DIAGNOSIS — L97311 Non-pressure chronic ulcer of right ankle limited to breakdown of skin: Secondary | ICD-10-CM | POA: Diagnosis not present

## 2013-10-01 DIAGNOSIS — I872 Venous insufficiency (chronic) (peripheral): Secondary | ICD-10-CM | POA: Diagnosis not present

## 2013-10-01 DIAGNOSIS — I824Z2 Acute embolism and thrombosis of unspecified deep veins of left distal lower extremity: Secondary | ICD-10-CM | POA: Diagnosis not present

## 2013-10-01 DIAGNOSIS — L97221 Non-pressure chronic ulcer of left calf limited to breakdown of skin: Secondary | ICD-10-CM | POA: Diagnosis not present

## 2013-10-01 DIAGNOSIS — L97809 Non-pressure chronic ulcer of other part of unspecified lower leg with unspecified severity: Secondary | ICD-10-CM | POA: Diagnosis not present

## 2013-10-01 DIAGNOSIS — S80921D Unspecified superficial injury of right lower leg, subsequent encounter: Secondary | ICD-10-CM | POA: Diagnosis not present

## 2013-10-01 DIAGNOSIS — L97811 Non-pressure chronic ulcer of other part of right lower leg limited to breakdown of skin: Secondary | ICD-10-CM | POA: Diagnosis not present

## 2013-10-01 DIAGNOSIS — I10 Essential (primary) hypertension: Secondary | ICD-10-CM | POA: Diagnosis not present

## 2013-10-01 DIAGNOSIS — L97821 Non-pressure chronic ulcer of other part of left lower leg limited to breakdown of skin: Secondary | ICD-10-CM | POA: Diagnosis not present

## 2013-10-01 DIAGNOSIS — S80911D Unspecified superficial injury of right knee, subsequent encounter: Secondary | ICD-10-CM | POA: Diagnosis not present

## 2013-10-01 DIAGNOSIS — I739 Peripheral vascular disease, unspecified: Secondary | ICD-10-CM | POA: Diagnosis not present

## 2013-10-01 DIAGNOSIS — I82409 Acute embolism and thrombosis of unspecified deep veins of unspecified lower extremity: Secondary | ICD-10-CM | POA: Diagnosis not present

## 2013-10-01 DIAGNOSIS — S50901D Unspecified superficial injury of right elbow, subsequent encounter: Secondary | ICD-10-CM | POA: Diagnosis not present

## 2013-10-01 DIAGNOSIS — S80821D Blister (nonthermal), right lower leg, subsequent encounter: Secondary | ICD-10-CM | POA: Diagnosis not present

## 2013-10-01 DIAGNOSIS — Z5181 Encounter for therapeutic drug level monitoring: Secondary | ICD-10-CM | POA: Diagnosis not present

## 2013-10-01 DIAGNOSIS — Z87891 Personal history of nicotine dependence: Secondary | ICD-10-CM | POA: Diagnosis not present

## 2013-10-01 DIAGNOSIS — L97209 Non-pressure chronic ulcer of unspecified calf with unspecified severity: Secondary | ICD-10-CM | POA: Diagnosis not present

## 2013-10-01 DIAGNOSIS — I4891 Unspecified atrial fibrillation: Secondary | ICD-10-CM | POA: Diagnosis not present

## 2013-10-02 DIAGNOSIS — L97221 Non-pressure chronic ulcer of left calf limited to breakdown of skin: Secondary | ICD-10-CM | POA: Diagnosis not present

## 2013-10-02 DIAGNOSIS — I824Z2 Acute embolism and thrombosis of unspecified deep veins of left distal lower extremity: Secondary | ICD-10-CM | POA: Diagnosis not present

## 2013-10-02 DIAGNOSIS — I872 Venous insufficiency (chronic) (peripheral): Secondary | ICD-10-CM | POA: Diagnosis not present

## 2013-10-02 DIAGNOSIS — L97821 Non-pressure chronic ulcer of other part of left lower leg limited to breakdown of skin: Secondary | ICD-10-CM | POA: Diagnosis not present

## 2013-10-02 DIAGNOSIS — L97311 Non-pressure chronic ulcer of right ankle limited to breakdown of skin: Secondary | ICD-10-CM | POA: Diagnosis not present

## 2013-10-02 DIAGNOSIS — L97811 Non-pressure chronic ulcer of other part of right lower leg limited to breakdown of skin: Secondary | ICD-10-CM | POA: Diagnosis not present

## 2013-10-06 DIAGNOSIS — L97811 Non-pressure chronic ulcer of other part of right lower leg limited to breakdown of skin: Secondary | ICD-10-CM | POA: Diagnosis not present

## 2013-10-06 DIAGNOSIS — L97221 Non-pressure chronic ulcer of left calf limited to breakdown of skin: Secondary | ICD-10-CM | POA: Diagnosis not present

## 2013-10-06 DIAGNOSIS — L97821 Non-pressure chronic ulcer of other part of left lower leg limited to breakdown of skin: Secondary | ICD-10-CM | POA: Diagnosis not present

## 2013-10-06 DIAGNOSIS — I872 Venous insufficiency (chronic) (peripheral): Secondary | ICD-10-CM | POA: Diagnosis not present

## 2013-10-06 DIAGNOSIS — L97311 Non-pressure chronic ulcer of right ankle limited to breakdown of skin: Secondary | ICD-10-CM | POA: Diagnosis not present

## 2013-10-06 DIAGNOSIS — I824Z2 Acute embolism and thrombosis of unspecified deep veins of left distal lower extremity: Secondary | ICD-10-CM | POA: Diagnosis not present

## 2013-10-10 DIAGNOSIS — L97811 Non-pressure chronic ulcer of other part of right lower leg limited to breakdown of skin: Secondary | ICD-10-CM | POA: Diagnosis not present

## 2013-10-10 DIAGNOSIS — L97311 Non-pressure chronic ulcer of right ankle limited to breakdown of skin: Secondary | ICD-10-CM | POA: Diagnosis not present

## 2013-10-10 DIAGNOSIS — I872 Venous insufficiency (chronic) (peripheral): Secondary | ICD-10-CM | POA: Diagnosis not present

## 2013-10-10 DIAGNOSIS — L97221 Non-pressure chronic ulcer of left calf limited to breakdown of skin: Secondary | ICD-10-CM | POA: Diagnosis not present

## 2013-10-10 DIAGNOSIS — I824Z2 Acute embolism and thrombosis of unspecified deep veins of left distal lower extremity: Secondary | ICD-10-CM | POA: Diagnosis not present

## 2013-10-10 DIAGNOSIS — L97821 Non-pressure chronic ulcer of other part of left lower leg limited to breakdown of skin: Secondary | ICD-10-CM | POA: Diagnosis not present

## 2013-10-11 DIAGNOSIS — S32009A Unspecified fracture of unspecified lumbar vertebra, initial encounter for closed fracture: Secondary | ICD-10-CM | POA: Diagnosis not present

## 2013-10-11 DIAGNOSIS — G894 Chronic pain syndrome: Secondary | ICD-10-CM | POA: Diagnosis not present

## 2013-10-11 DIAGNOSIS — M47817 Spondylosis without myelopathy or radiculopathy, lumbosacral region: Secondary | ICD-10-CM | POA: Diagnosis not present

## 2013-10-11 DIAGNOSIS — M810 Age-related osteoporosis without current pathological fracture: Secondary | ICD-10-CM | POA: Diagnosis not present

## 2013-10-12 DIAGNOSIS — K219 Gastro-esophageal reflux disease without esophagitis: Secondary | ICD-10-CM | POA: Diagnosis not present

## 2013-10-12 DIAGNOSIS — K5289 Other specified noninfective gastroenteritis and colitis: Secondary | ICD-10-CM | POA: Diagnosis not present

## 2013-10-13 DIAGNOSIS — L97311 Non-pressure chronic ulcer of right ankle limited to breakdown of skin: Secondary | ICD-10-CM | POA: Diagnosis not present

## 2013-10-13 DIAGNOSIS — L97811 Non-pressure chronic ulcer of other part of right lower leg limited to breakdown of skin: Secondary | ICD-10-CM | POA: Diagnosis not present

## 2013-10-13 DIAGNOSIS — L97821 Non-pressure chronic ulcer of other part of left lower leg limited to breakdown of skin: Secondary | ICD-10-CM | POA: Diagnosis not present

## 2013-10-13 DIAGNOSIS — L97221 Non-pressure chronic ulcer of left calf limited to breakdown of skin: Secondary | ICD-10-CM | POA: Diagnosis not present

## 2013-10-13 DIAGNOSIS — I824Z2 Acute embolism and thrombosis of unspecified deep veins of left distal lower extremity: Secondary | ICD-10-CM | POA: Diagnosis not present

## 2013-10-13 DIAGNOSIS — I872 Venous insufficiency (chronic) (peripheral): Secondary | ICD-10-CM | POA: Diagnosis not present

## 2013-10-15 DIAGNOSIS — L97811 Non-pressure chronic ulcer of other part of right lower leg limited to breakdown of skin: Secondary | ICD-10-CM | POA: Diagnosis not present

## 2013-10-15 DIAGNOSIS — I872 Venous insufficiency (chronic) (peripheral): Secondary | ICD-10-CM | POA: Diagnosis not present

## 2013-10-15 DIAGNOSIS — L97221 Non-pressure chronic ulcer of left calf limited to breakdown of skin: Secondary | ICD-10-CM | POA: Diagnosis not present

## 2013-10-15 DIAGNOSIS — L97311 Non-pressure chronic ulcer of right ankle limited to breakdown of skin: Secondary | ICD-10-CM | POA: Diagnosis not present

## 2013-10-15 DIAGNOSIS — I824Z2 Acute embolism and thrombosis of unspecified deep veins of left distal lower extremity: Secondary | ICD-10-CM | POA: Diagnosis not present

## 2013-10-15 DIAGNOSIS — L97821 Non-pressure chronic ulcer of other part of left lower leg limited to breakdown of skin: Secondary | ICD-10-CM | POA: Diagnosis not present

## 2013-10-18 DIAGNOSIS — L97221 Non-pressure chronic ulcer of left calf limited to breakdown of skin: Secondary | ICD-10-CM | POA: Diagnosis not present

## 2013-10-18 DIAGNOSIS — L97811 Non-pressure chronic ulcer of other part of right lower leg limited to breakdown of skin: Secondary | ICD-10-CM | POA: Diagnosis not present

## 2013-10-18 DIAGNOSIS — L97311 Non-pressure chronic ulcer of right ankle limited to breakdown of skin: Secondary | ICD-10-CM | POA: Diagnosis not present

## 2013-10-18 DIAGNOSIS — I824Z2 Acute embolism and thrombosis of unspecified deep veins of left distal lower extremity: Secondary | ICD-10-CM | POA: Diagnosis not present

## 2013-10-18 DIAGNOSIS — L97821 Non-pressure chronic ulcer of other part of left lower leg limited to breakdown of skin: Secondary | ICD-10-CM | POA: Diagnosis not present

## 2013-10-18 DIAGNOSIS — I872 Venous insufficiency (chronic) (peripheral): Secondary | ICD-10-CM | POA: Diagnosis not present

## 2013-10-22 DIAGNOSIS — L97811 Non-pressure chronic ulcer of other part of right lower leg limited to breakdown of skin: Secondary | ICD-10-CM | POA: Diagnosis not present

## 2013-10-22 DIAGNOSIS — I824Z2 Acute embolism and thrombosis of unspecified deep veins of left distal lower extremity: Secondary | ICD-10-CM | POA: Diagnosis not present

## 2013-10-22 DIAGNOSIS — I872 Venous insufficiency (chronic) (peripheral): Secondary | ICD-10-CM | POA: Diagnosis not present

## 2013-10-22 DIAGNOSIS — L97821 Non-pressure chronic ulcer of other part of left lower leg limited to breakdown of skin: Secondary | ICD-10-CM | POA: Diagnosis not present

## 2013-10-22 DIAGNOSIS — L97221 Non-pressure chronic ulcer of left calf limited to breakdown of skin: Secondary | ICD-10-CM | POA: Diagnosis not present

## 2013-10-22 DIAGNOSIS — L97311 Non-pressure chronic ulcer of right ankle limited to breakdown of skin: Secondary | ICD-10-CM | POA: Diagnosis not present

## 2013-10-25 DIAGNOSIS — I872 Venous insufficiency (chronic) (peripheral): Secondary | ICD-10-CM | POA: Diagnosis not present

## 2013-10-25 DIAGNOSIS — L97821 Non-pressure chronic ulcer of other part of left lower leg limited to breakdown of skin: Secondary | ICD-10-CM | POA: Diagnosis not present

## 2013-10-25 DIAGNOSIS — L97221 Non-pressure chronic ulcer of left calf limited to breakdown of skin: Secondary | ICD-10-CM | POA: Diagnosis not present

## 2013-10-25 DIAGNOSIS — L97811 Non-pressure chronic ulcer of other part of right lower leg limited to breakdown of skin: Secondary | ICD-10-CM | POA: Diagnosis not present

## 2013-10-25 DIAGNOSIS — L97311 Non-pressure chronic ulcer of right ankle limited to breakdown of skin: Secondary | ICD-10-CM | POA: Diagnosis not present

## 2013-10-25 DIAGNOSIS — I824Z2 Acute embolism and thrombosis of unspecified deep veins of left distal lower extremity: Secondary | ICD-10-CM | POA: Diagnosis not present

## 2013-10-29 DIAGNOSIS — L97221 Non-pressure chronic ulcer of left calf limited to breakdown of skin: Secondary | ICD-10-CM | POA: Diagnosis not present

## 2013-10-29 DIAGNOSIS — I824Z2 Acute embolism and thrombosis of unspecified deep veins of left distal lower extremity: Secondary | ICD-10-CM | POA: Diagnosis not present

## 2013-10-29 DIAGNOSIS — I872 Venous insufficiency (chronic) (peripheral): Secondary | ICD-10-CM | POA: Diagnosis not present

## 2013-10-29 DIAGNOSIS — L97821 Non-pressure chronic ulcer of other part of left lower leg limited to breakdown of skin: Secondary | ICD-10-CM | POA: Diagnosis not present

## 2013-10-29 DIAGNOSIS — L97311 Non-pressure chronic ulcer of right ankle limited to breakdown of skin: Secondary | ICD-10-CM | POA: Diagnosis not present

## 2013-10-29 DIAGNOSIS — L97811 Non-pressure chronic ulcer of other part of right lower leg limited to breakdown of skin: Secondary | ICD-10-CM | POA: Diagnosis not present

## 2013-11-01 DIAGNOSIS — L97821 Non-pressure chronic ulcer of other part of left lower leg limited to breakdown of skin: Secondary | ICD-10-CM | POA: Diagnosis not present

## 2013-11-01 DIAGNOSIS — L97811 Non-pressure chronic ulcer of other part of right lower leg limited to breakdown of skin: Secondary | ICD-10-CM | POA: Diagnosis not present

## 2013-11-01 DIAGNOSIS — L97311 Non-pressure chronic ulcer of right ankle limited to breakdown of skin: Secondary | ICD-10-CM | POA: Diagnosis not present

## 2013-11-01 DIAGNOSIS — I824Z2 Acute embolism and thrombosis of unspecified deep veins of left distal lower extremity: Secondary | ICD-10-CM | POA: Diagnosis not present

## 2013-11-01 DIAGNOSIS — L97221 Non-pressure chronic ulcer of left calf limited to breakdown of skin: Secondary | ICD-10-CM | POA: Diagnosis not present

## 2013-11-01 DIAGNOSIS — I872 Venous insufficiency (chronic) (peripheral): Secondary | ICD-10-CM | POA: Diagnosis not present

## 2013-11-02 DIAGNOSIS — Z7901 Long term (current) use of anticoagulants: Secondary | ICD-10-CM | POA: Diagnosis not present

## 2013-11-02 DIAGNOSIS — Z6825 Body mass index (BMI) 25.0-25.9, adult: Secondary | ICD-10-CM | POA: Diagnosis not present

## 2013-11-02 DIAGNOSIS — S22009A Unspecified fracture of unspecified thoracic vertebra, initial encounter for closed fracture: Secondary | ICD-10-CM | POA: Diagnosis not present

## 2013-11-02 DIAGNOSIS — S81009A Unspecified open wound, unspecified knee, initial encounter: Secondary | ICD-10-CM | POA: Diagnosis not present

## 2013-11-02 DIAGNOSIS — S81809A Unspecified open wound, unspecified lower leg, initial encounter: Secondary | ICD-10-CM | POA: Diagnosis not present

## 2013-11-02 DIAGNOSIS — Z8672 Personal history of thrombophlebitis: Secondary | ICD-10-CM | POA: Diagnosis not present

## 2013-11-02 DIAGNOSIS — M439 Deforming dorsopathy, unspecified: Secondary | ICD-10-CM | POA: Diagnosis not present

## 2013-11-02 DIAGNOSIS — M546 Pain in thoracic spine: Secondary | ICD-10-CM | POA: Diagnosis not present

## 2013-11-02 DIAGNOSIS — Z9181 History of falling: Secondary | ICD-10-CM | POA: Diagnosis not present

## 2013-11-06 DIAGNOSIS — L97311 Non-pressure chronic ulcer of right ankle limited to breakdown of skin: Secondary | ICD-10-CM | POA: Diagnosis not present

## 2013-11-06 DIAGNOSIS — L97821 Non-pressure chronic ulcer of other part of left lower leg limited to breakdown of skin: Secondary | ICD-10-CM | POA: Diagnosis not present

## 2013-11-06 DIAGNOSIS — L97811 Non-pressure chronic ulcer of other part of right lower leg limited to breakdown of skin: Secondary | ICD-10-CM | POA: Diagnosis not present

## 2013-11-06 DIAGNOSIS — S32009A Unspecified fracture of unspecified lumbar vertebra, initial encounter for closed fracture: Secondary | ICD-10-CM | POA: Diagnosis not present

## 2013-11-06 DIAGNOSIS — G894 Chronic pain syndrome: Secondary | ICD-10-CM | POA: Diagnosis not present

## 2013-11-06 DIAGNOSIS — I872 Venous insufficiency (chronic) (peripheral): Secondary | ICD-10-CM | POA: Diagnosis not present

## 2013-11-06 DIAGNOSIS — Z5181 Encounter for therapeutic drug level monitoring: Secondary | ICD-10-CM | POA: Diagnosis not present

## 2013-11-06 DIAGNOSIS — L97221 Non-pressure chronic ulcer of left calf limited to breakdown of skin: Secondary | ICD-10-CM | POA: Diagnosis not present

## 2013-11-06 DIAGNOSIS — M810 Age-related osteoporosis without current pathological fracture: Secondary | ICD-10-CM | POA: Diagnosis not present

## 2013-11-06 DIAGNOSIS — M47817 Spondylosis without myelopathy or radiculopathy, lumbosacral region: Secondary | ICD-10-CM | POA: Diagnosis not present

## 2013-11-06 DIAGNOSIS — I824Z2 Acute embolism and thrombosis of unspecified deep veins of left distal lower extremity: Secondary | ICD-10-CM | POA: Diagnosis not present

## 2013-11-08 DIAGNOSIS — I824Z2 Acute embolism and thrombosis of unspecified deep veins of left distal lower extremity: Secondary | ICD-10-CM | POA: Diagnosis not present

## 2013-11-08 DIAGNOSIS — L97811 Non-pressure chronic ulcer of other part of right lower leg limited to breakdown of skin: Secondary | ICD-10-CM | POA: Diagnosis not present

## 2013-11-08 DIAGNOSIS — L97311 Non-pressure chronic ulcer of right ankle limited to breakdown of skin: Secondary | ICD-10-CM | POA: Diagnosis not present

## 2013-11-08 DIAGNOSIS — I872 Venous insufficiency (chronic) (peripheral): Secondary | ICD-10-CM | POA: Diagnosis not present

## 2013-11-08 DIAGNOSIS — L97221 Non-pressure chronic ulcer of left calf limited to breakdown of skin: Secondary | ICD-10-CM | POA: Diagnosis not present

## 2013-11-08 DIAGNOSIS — L97821 Non-pressure chronic ulcer of other part of left lower leg limited to breakdown of skin: Secondary | ICD-10-CM | POA: Diagnosis not present

## 2013-11-12 DIAGNOSIS — I824Z2 Acute embolism and thrombosis of unspecified deep veins of left distal lower extremity: Secondary | ICD-10-CM | POA: Diagnosis not present

## 2013-11-12 DIAGNOSIS — L97221 Non-pressure chronic ulcer of left calf limited to breakdown of skin: Secondary | ICD-10-CM | POA: Diagnosis not present

## 2013-11-12 DIAGNOSIS — I872 Venous insufficiency (chronic) (peripheral): Secondary | ICD-10-CM | POA: Diagnosis not present

## 2013-11-12 DIAGNOSIS — L97821 Non-pressure chronic ulcer of other part of left lower leg limited to breakdown of skin: Secondary | ICD-10-CM | POA: Diagnosis not present

## 2013-11-12 DIAGNOSIS — L97811 Non-pressure chronic ulcer of other part of right lower leg limited to breakdown of skin: Secondary | ICD-10-CM | POA: Diagnosis not present

## 2013-11-12 DIAGNOSIS — L97311 Non-pressure chronic ulcer of right ankle limited to breakdown of skin: Secondary | ICD-10-CM | POA: Diagnosis not present

## 2013-11-14 DIAGNOSIS — L97311 Non-pressure chronic ulcer of right ankle limited to breakdown of skin: Secondary | ICD-10-CM | POA: Diagnosis not present

## 2013-11-14 DIAGNOSIS — I872 Venous insufficiency (chronic) (peripheral): Secondary | ICD-10-CM | POA: Diagnosis not present

## 2013-11-14 DIAGNOSIS — L97811 Non-pressure chronic ulcer of other part of right lower leg limited to breakdown of skin: Secondary | ICD-10-CM | POA: Diagnosis not present

## 2013-11-14 DIAGNOSIS — L97221 Non-pressure chronic ulcer of left calf limited to breakdown of skin: Secondary | ICD-10-CM | POA: Diagnosis not present

## 2013-11-14 DIAGNOSIS — I824Z2 Acute embolism and thrombosis of unspecified deep veins of left distal lower extremity: Secondary | ICD-10-CM | POA: Diagnosis not present

## 2013-11-14 DIAGNOSIS — L97821 Non-pressure chronic ulcer of other part of left lower leg limited to breakdown of skin: Secondary | ICD-10-CM | POA: Diagnosis not present

## 2013-11-19 DIAGNOSIS — L97811 Non-pressure chronic ulcer of other part of right lower leg limited to breakdown of skin: Secondary | ICD-10-CM | POA: Diagnosis not present

## 2013-11-19 DIAGNOSIS — I824Z2 Acute embolism and thrombosis of unspecified deep veins of left distal lower extremity: Secondary | ICD-10-CM | POA: Diagnosis not present

## 2013-11-19 DIAGNOSIS — L97311 Non-pressure chronic ulcer of right ankle limited to breakdown of skin: Secondary | ICD-10-CM | POA: Diagnosis not present

## 2013-11-19 DIAGNOSIS — I872 Venous insufficiency (chronic) (peripheral): Secondary | ICD-10-CM | POA: Diagnosis not present

## 2013-11-19 DIAGNOSIS — L97221 Non-pressure chronic ulcer of left calf limited to breakdown of skin: Secondary | ICD-10-CM | POA: Diagnosis not present

## 2013-11-19 DIAGNOSIS — L97821 Non-pressure chronic ulcer of other part of left lower leg limited to breakdown of skin: Secondary | ICD-10-CM | POA: Diagnosis not present

## 2013-11-22 DIAGNOSIS — I824Z2 Acute embolism and thrombosis of unspecified deep veins of left distal lower extremity: Secondary | ICD-10-CM | POA: Diagnosis not present

## 2013-11-22 DIAGNOSIS — I872 Venous insufficiency (chronic) (peripheral): Secondary | ICD-10-CM | POA: Diagnosis not present

## 2013-11-22 DIAGNOSIS — L97811 Non-pressure chronic ulcer of other part of right lower leg limited to breakdown of skin: Secondary | ICD-10-CM | POA: Diagnosis not present

## 2013-11-22 DIAGNOSIS — L97221 Non-pressure chronic ulcer of left calf limited to breakdown of skin: Secondary | ICD-10-CM | POA: Diagnosis not present

## 2013-11-22 DIAGNOSIS — L97311 Non-pressure chronic ulcer of right ankle limited to breakdown of skin: Secondary | ICD-10-CM | POA: Diagnosis not present

## 2013-11-22 DIAGNOSIS — L97821 Non-pressure chronic ulcer of other part of left lower leg limited to breakdown of skin: Secondary | ICD-10-CM | POA: Diagnosis not present

## 2013-11-26 DIAGNOSIS — L97311 Non-pressure chronic ulcer of right ankle limited to breakdown of skin: Secondary | ICD-10-CM | POA: Diagnosis not present

## 2013-11-26 DIAGNOSIS — L97221 Non-pressure chronic ulcer of left calf limited to breakdown of skin: Secondary | ICD-10-CM | POA: Diagnosis not present

## 2013-11-26 DIAGNOSIS — I872 Venous insufficiency (chronic) (peripheral): Secondary | ICD-10-CM | POA: Diagnosis not present

## 2013-11-26 DIAGNOSIS — I824Z2 Acute embolism and thrombosis of unspecified deep veins of left distal lower extremity: Secondary | ICD-10-CM | POA: Diagnosis not present

## 2013-11-26 DIAGNOSIS — L97821 Non-pressure chronic ulcer of other part of left lower leg limited to breakdown of skin: Secondary | ICD-10-CM | POA: Diagnosis not present

## 2013-11-26 DIAGNOSIS — L97811 Non-pressure chronic ulcer of other part of right lower leg limited to breakdown of skin: Secondary | ICD-10-CM | POA: Diagnosis not present

## 2013-11-29 DIAGNOSIS — L97811 Non-pressure chronic ulcer of other part of right lower leg limited to breakdown of skin: Secondary | ICD-10-CM | POA: Diagnosis not present

## 2013-11-29 DIAGNOSIS — L97311 Non-pressure chronic ulcer of right ankle limited to breakdown of skin: Secondary | ICD-10-CM | POA: Diagnosis not present

## 2013-11-29 DIAGNOSIS — L97821 Non-pressure chronic ulcer of other part of left lower leg limited to breakdown of skin: Secondary | ICD-10-CM | POA: Diagnosis not present

## 2013-11-29 DIAGNOSIS — I824Z2 Acute embolism and thrombosis of unspecified deep veins of left distal lower extremity: Secondary | ICD-10-CM | POA: Diagnosis not present

## 2013-11-29 DIAGNOSIS — I872 Venous insufficiency (chronic) (peripheral): Secondary | ICD-10-CM | POA: Diagnosis not present

## 2013-11-29 DIAGNOSIS — L97221 Non-pressure chronic ulcer of left calf limited to breakdown of skin: Secondary | ICD-10-CM | POA: Diagnosis not present

## 2013-11-30 DIAGNOSIS — M79642 Pain in left hand: Secondary | ICD-10-CM | POA: Diagnosis not present

## 2013-11-30 DIAGNOSIS — I10 Essential (primary) hypertension: Secondary | ICD-10-CM | POA: Diagnosis not present

## 2013-11-30 DIAGNOSIS — L97221 Non-pressure chronic ulcer of left calf limited to breakdown of skin: Secondary | ICD-10-CM | POA: Diagnosis not present

## 2013-11-30 DIAGNOSIS — Z7901 Long term (current) use of anticoagulants: Secondary | ICD-10-CM | POA: Diagnosis not present

## 2013-11-30 DIAGNOSIS — M79645 Pain in left finger(s): Secondary | ICD-10-CM | POA: Diagnosis not present

## 2013-11-30 DIAGNOSIS — Z5181 Encounter for therapeutic drug level monitoring: Secondary | ICD-10-CM | POA: Diagnosis not present

## 2013-11-30 DIAGNOSIS — I4891 Unspecified atrial fibrillation: Secondary | ICD-10-CM | POA: Diagnosis not present

## 2013-11-30 DIAGNOSIS — S62355A Nondisplaced fracture of shaft of fourth metacarpal bone, left hand, initial encounter for closed fracture: Secondary | ICD-10-CM | POA: Diagnosis not present

## 2013-11-30 DIAGNOSIS — I872 Venous insufficiency (chronic) (peripheral): Secondary | ICD-10-CM | POA: Diagnosis not present

## 2013-11-30 DIAGNOSIS — I739 Peripheral vascular disease, unspecified: Secondary | ICD-10-CM | POA: Diagnosis not present

## 2013-11-30 DIAGNOSIS — L97821 Non-pressure chronic ulcer of other part of left lower leg limited to breakdown of skin: Secondary | ICD-10-CM | POA: Diagnosis not present

## 2013-12-03 DIAGNOSIS — L97821 Non-pressure chronic ulcer of other part of left lower leg limited to breakdown of skin: Secondary | ICD-10-CM | POA: Diagnosis not present

## 2013-12-03 DIAGNOSIS — I739 Peripheral vascular disease, unspecified: Secondary | ICD-10-CM | POA: Diagnosis not present

## 2013-12-03 DIAGNOSIS — I872 Venous insufficiency (chronic) (peripheral): Secondary | ICD-10-CM | POA: Diagnosis not present

## 2013-12-03 DIAGNOSIS — I4891 Unspecified atrial fibrillation: Secondary | ICD-10-CM | POA: Diagnosis not present

## 2013-12-03 DIAGNOSIS — I10 Essential (primary) hypertension: Secondary | ICD-10-CM | POA: Diagnosis not present

## 2013-12-03 DIAGNOSIS — L97221 Non-pressure chronic ulcer of left calf limited to breakdown of skin: Secondary | ICD-10-CM | POA: Diagnosis not present

## 2013-12-05 DIAGNOSIS — G894 Chronic pain syndrome: Secondary | ICD-10-CM | POA: Diagnosis not present

## 2013-12-05 DIAGNOSIS — M47816 Spondylosis without myelopathy or radiculopathy, lumbar region: Secondary | ICD-10-CM | POA: Diagnosis not present

## 2013-12-05 DIAGNOSIS — Z79891 Long term (current) use of opiate analgesic: Secondary | ICD-10-CM | POA: Diagnosis not present

## 2013-12-05 DIAGNOSIS — M810 Age-related osteoporosis without current pathological fracture: Secondary | ICD-10-CM | POA: Diagnosis not present

## 2013-12-05 DIAGNOSIS — M62830 Muscle spasm of back: Secondary | ICD-10-CM | POA: Diagnosis not present

## 2013-12-05 DIAGNOSIS — M8000XS Age-related osteoporosis with current pathological fracture, unspecified site, sequela: Secondary | ICD-10-CM | POA: Diagnosis not present

## 2013-12-06 DIAGNOSIS — L97221 Non-pressure chronic ulcer of left calf limited to breakdown of skin: Secondary | ICD-10-CM | POA: Diagnosis not present

## 2013-12-06 DIAGNOSIS — I4891 Unspecified atrial fibrillation: Secondary | ICD-10-CM | POA: Diagnosis not present

## 2013-12-06 DIAGNOSIS — I872 Venous insufficiency (chronic) (peripheral): Secondary | ICD-10-CM | POA: Diagnosis not present

## 2013-12-06 DIAGNOSIS — L97821 Non-pressure chronic ulcer of other part of left lower leg limited to breakdown of skin: Secondary | ICD-10-CM | POA: Diagnosis not present

## 2013-12-06 DIAGNOSIS — I10 Essential (primary) hypertension: Secondary | ICD-10-CM | POA: Diagnosis not present

## 2013-12-06 DIAGNOSIS — I739 Peripheral vascular disease, unspecified: Secondary | ICD-10-CM | POA: Diagnosis not present

## 2013-12-10 DIAGNOSIS — I10 Essential (primary) hypertension: Secondary | ICD-10-CM | POA: Diagnosis not present

## 2013-12-10 DIAGNOSIS — L97821 Non-pressure chronic ulcer of other part of left lower leg limited to breakdown of skin: Secondary | ICD-10-CM | POA: Diagnosis not present

## 2013-12-10 DIAGNOSIS — I739 Peripheral vascular disease, unspecified: Secondary | ICD-10-CM | POA: Diagnosis not present

## 2013-12-10 DIAGNOSIS — I4891 Unspecified atrial fibrillation: Secondary | ICD-10-CM | POA: Diagnosis not present

## 2013-12-10 DIAGNOSIS — L97221 Non-pressure chronic ulcer of left calf limited to breakdown of skin: Secondary | ICD-10-CM | POA: Diagnosis not present

## 2013-12-10 DIAGNOSIS — I872 Venous insufficiency (chronic) (peripheral): Secondary | ICD-10-CM | POA: Diagnosis not present

## 2013-12-13 DIAGNOSIS — L97221 Non-pressure chronic ulcer of left calf limited to breakdown of skin: Secondary | ICD-10-CM | POA: Diagnosis not present

## 2013-12-13 DIAGNOSIS — I4891 Unspecified atrial fibrillation: Secondary | ICD-10-CM | POA: Diagnosis not present

## 2013-12-13 DIAGNOSIS — I872 Venous insufficiency (chronic) (peripheral): Secondary | ICD-10-CM | POA: Diagnosis not present

## 2013-12-13 DIAGNOSIS — I10 Essential (primary) hypertension: Secondary | ICD-10-CM | POA: Diagnosis not present

## 2013-12-13 DIAGNOSIS — L97821 Non-pressure chronic ulcer of other part of left lower leg limited to breakdown of skin: Secondary | ICD-10-CM | POA: Diagnosis not present

## 2013-12-13 DIAGNOSIS — I739 Peripheral vascular disease, unspecified: Secondary | ICD-10-CM | POA: Diagnosis not present

## 2013-12-17 DIAGNOSIS — S62355D Nondisplaced fracture of shaft of fourth metacarpal bone, left hand, subsequent encounter for fracture with routine healing: Secondary | ICD-10-CM | POA: Diagnosis not present

## 2013-12-18 DIAGNOSIS — L97221 Non-pressure chronic ulcer of left calf limited to breakdown of skin: Secondary | ICD-10-CM | POA: Diagnosis not present

## 2013-12-18 DIAGNOSIS — I10 Essential (primary) hypertension: Secondary | ICD-10-CM | POA: Diagnosis not present

## 2013-12-18 DIAGNOSIS — I4891 Unspecified atrial fibrillation: Secondary | ICD-10-CM | POA: Diagnosis not present

## 2013-12-18 DIAGNOSIS — I872 Venous insufficiency (chronic) (peripheral): Secondary | ICD-10-CM | POA: Diagnosis not present

## 2013-12-18 DIAGNOSIS — I739 Peripheral vascular disease, unspecified: Secondary | ICD-10-CM | POA: Diagnosis not present

## 2013-12-18 DIAGNOSIS — L97821 Non-pressure chronic ulcer of other part of left lower leg limited to breakdown of skin: Secondary | ICD-10-CM | POA: Diagnosis not present

## 2013-12-20 DIAGNOSIS — I4891 Unspecified atrial fibrillation: Secondary | ICD-10-CM | POA: Diagnosis not present

## 2013-12-20 DIAGNOSIS — I872 Venous insufficiency (chronic) (peripheral): Secondary | ICD-10-CM | POA: Diagnosis not present

## 2013-12-20 DIAGNOSIS — L97221 Non-pressure chronic ulcer of left calf limited to breakdown of skin: Secondary | ICD-10-CM | POA: Diagnosis not present

## 2013-12-20 DIAGNOSIS — L97821 Non-pressure chronic ulcer of other part of left lower leg limited to breakdown of skin: Secondary | ICD-10-CM | POA: Diagnosis not present

## 2013-12-20 DIAGNOSIS — I739 Peripheral vascular disease, unspecified: Secondary | ICD-10-CM | POA: Diagnosis not present

## 2013-12-20 DIAGNOSIS — I10 Essential (primary) hypertension: Secondary | ICD-10-CM | POA: Diagnosis not present

## 2013-12-21 DIAGNOSIS — R05 Cough: Secondary | ICD-10-CM | POA: Diagnosis not present

## 2013-12-21 DIAGNOSIS — Z8672 Personal history of thrombophlebitis: Secondary | ICD-10-CM | POA: Diagnosis not present

## 2013-12-21 DIAGNOSIS — Z6825 Body mass index (BMI) 25.0-25.9, adult: Secondary | ICD-10-CM | POA: Diagnosis not present

## 2013-12-24 DIAGNOSIS — I10 Essential (primary) hypertension: Secondary | ICD-10-CM | POA: Diagnosis not present

## 2013-12-24 DIAGNOSIS — L97821 Non-pressure chronic ulcer of other part of left lower leg limited to breakdown of skin: Secondary | ICD-10-CM | POA: Diagnosis not present

## 2013-12-24 DIAGNOSIS — L97221 Non-pressure chronic ulcer of left calf limited to breakdown of skin: Secondary | ICD-10-CM | POA: Diagnosis not present

## 2013-12-24 DIAGNOSIS — I4891 Unspecified atrial fibrillation: Secondary | ICD-10-CM | POA: Diagnosis not present

## 2013-12-24 DIAGNOSIS — I739 Peripheral vascular disease, unspecified: Secondary | ICD-10-CM | POA: Diagnosis not present

## 2013-12-24 DIAGNOSIS — I872 Venous insufficiency (chronic) (peripheral): Secondary | ICD-10-CM | POA: Diagnosis not present

## 2013-12-25 DIAGNOSIS — L97821 Non-pressure chronic ulcer of other part of left lower leg limited to breakdown of skin: Secondary | ICD-10-CM | POA: Diagnosis not present

## 2013-12-25 DIAGNOSIS — L97221 Non-pressure chronic ulcer of left calf limited to breakdown of skin: Secondary | ICD-10-CM | POA: Diagnosis not present

## 2013-12-25 DIAGNOSIS — I872 Venous insufficiency (chronic) (peripheral): Secondary | ICD-10-CM | POA: Diagnosis not present

## 2013-12-25 DIAGNOSIS — I10 Essential (primary) hypertension: Secondary | ICD-10-CM | POA: Diagnosis not present

## 2013-12-25 DIAGNOSIS — I4891 Unspecified atrial fibrillation: Secondary | ICD-10-CM | POA: Diagnosis not present

## 2013-12-25 DIAGNOSIS — I739 Peripheral vascular disease, unspecified: Secondary | ICD-10-CM | POA: Diagnosis not present

## 2013-12-27 DIAGNOSIS — L97821 Non-pressure chronic ulcer of other part of left lower leg limited to breakdown of skin: Secondary | ICD-10-CM | POA: Diagnosis not present

## 2013-12-27 DIAGNOSIS — K529 Noninfective gastroenteritis and colitis, unspecified: Secondary | ICD-10-CM | POA: Diagnosis not present

## 2013-12-27 DIAGNOSIS — R062 Wheezing: Secondary | ICD-10-CM | POA: Diagnosis not present

## 2013-12-27 DIAGNOSIS — R05 Cough: Secondary | ICD-10-CM | POA: Diagnosis not present

## 2013-12-27 DIAGNOSIS — I739 Peripheral vascular disease, unspecified: Secondary | ICD-10-CM | POA: Diagnosis not present

## 2013-12-27 DIAGNOSIS — I872 Venous insufficiency (chronic) (peripheral): Secondary | ICD-10-CM | POA: Diagnosis not present

## 2013-12-27 DIAGNOSIS — Z6825 Body mass index (BMI) 25.0-25.9, adult: Secondary | ICD-10-CM | POA: Diagnosis not present

## 2013-12-27 DIAGNOSIS — J209 Acute bronchitis, unspecified: Secondary | ICD-10-CM | POA: Diagnosis not present

## 2013-12-27 DIAGNOSIS — L97221 Non-pressure chronic ulcer of left calf limited to breakdown of skin: Secondary | ICD-10-CM | POA: Diagnosis not present

## 2013-12-27 DIAGNOSIS — I10 Essential (primary) hypertension: Secondary | ICD-10-CM | POA: Diagnosis not present

## 2013-12-27 DIAGNOSIS — I4891 Unspecified atrial fibrillation: Secondary | ICD-10-CM | POA: Diagnosis not present

## 2013-12-28 DIAGNOSIS — S62303D Unspecified fracture of third metacarpal bone, left hand, subsequent encounter for fracture with routine healing: Secondary | ICD-10-CM | POA: Diagnosis not present

## 2013-12-31 DIAGNOSIS — I10 Essential (primary) hypertension: Secondary | ICD-10-CM | POA: Diagnosis not present

## 2013-12-31 DIAGNOSIS — I872 Venous insufficiency (chronic) (peripheral): Secondary | ICD-10-CM | POA: Diagnosis not present

## 2013-12-31 DIAGNOSIS — I4891 Unspecified atrial fibrillation: Secondary | ICD-10-CM | POA: Diagnosis not present

## 2013-12-31 DIAGNOSIS — L97221 Non-pressure chronic ulcer of left calf limited to breakdown of skin: Secondary | ICD-10-CM | POA: Diagnosis not present

## 2013-12-31 DIAGNOSIS — I739 Peripheral vascular disease, unspecified: Secondary | ICD-10-CM | POA: Diagnosis not present

## 2013-12-31 DIAGNOSIS — L97821 Non-pressure chronic ulcer of other part of left lower leg limited to breakdown of skin: Secondary | ICD-10-CM | POA: Diagnosis not present

## 2014-01-02 DIAGNOSIS — M8000XS Age-related osteoporosis with current pathological fracture, unspecified site, sequela: Secondary | ICD-10-CM | POA: Diagnosis not present

## 2014-01-02 DIAGNOSIS — G894 Chronic pain syndrome: Secondary | ICD-10-CM | POA: Diagnosis not present

## 2014-01-02 DIAGNOSIS — M47816 Spondylosis without myelopathy or radiculopathy, lumbar region: Secondary | ICD-10-CM | POA: Diagnosis not present

## 2014-01-02 DIAGNOSIS — S62303D Unspecified fracture of third metacarpal bone, left hand, subsequent encounter for fracture with routine healing: Secondary | ICD-10-CM | POA: Diagnosis not present

## 2014-01-02 DIAGNOSIS — M62830 Muscle spasm of back: Secondary | ICD-10-CM | POA: Diagnosis not present

## 2014-01-03 DIAGNOSIS — I739 Peripheral vascular disease, unspecified: Secondary | ICD-10-CM | POA: Diagnosis not present

## 2014-01-03 DIAGNOSIS — I4891 Unspecified atrial fibrillation: Secondary | ICD-10-CM | POA: Diagnosis not present

## 2014-01-03 DIAGNOSIS — L97821 Non-pressure chronic ulcer of other part of left lower leg limited to breakdown of skin: Secondary | ICD-10-CM | POA: Diagnosis not present

## 2014-01-03 DIAGNOSIS — I872 Venous insufficiency (chronic) (peripheral): Secondary | ICD-10-CM | POA: Diagnosis not present

## 2014-01-03 DIAGNOSIS — L97221 Non-pressure chronic ulcer of left calf limited to breakdown of skin: Secondary | ICD-10-CM | POA: Diagnosis not present

## 2014-01-03 DIAGNOSIS — I10 Essential (primary) hypertension: Secondary | ICD-10-CM | POA: Diagnosis not present

## 2014-01-05 DIAGNOSIS — L97221 Non-pressure chronic ulcer of left calf limited to breakdown of skin: Secondary | ICD-10-CM | POA: Diagnosis not present

## 2014-01-05 DIAGNOSIS — I10 Essential (primary) hypertension: Secondary | ICD-10-CM | POA: Diagnosis not present

## 2014-01-05 DIAGNOSIS — I4891 Unspecified atrial fibrillation: Secondary | ICD-10-CM | POA: Diagnosis not present

## 2014-01-05 DIAGNOSIS — I739 Peripheral vascular disease, unspecified: Secondary | ICD-10-CM | POA: Diagnosis not present

## 2014-01-05 DIAGNOSIS — L97821 Non-pressure chronic ulcer of other part of left lower leg limited to breakdown of skin: Secondary | ICD-10-CM | POA: Diagnosis not present

## 2014-01-05 DIAGNOSIS — I872 Venous insufficiency (chronic) (peripheral): Secondary | ICD-10-CM | POA: Diagnosis not present

## 2014-01-07 DIAGNOSIS — K219 Gastro-esophageal reflux disease without esophagitis: Secondary | ICD-10-CM | POA: Diagnosis not present

## 2014-01-07 DIAGNOSIS — K5289 Other specified noninfective gastroenteritis and colitis: Secondary | ICD-10-CM | POA: Diagnosis not present

## 2014-01-08 DIAGNOSIS — I739 Peripheral vascular disease, unspecified: Secondary | ICD-10-CM | POA: Diagnosis not present

## 2014-01-08 DIAGNOSIS — L97221 Non-pressure chronic ulcer of left calf limited to breakdown of skin: Secondary | ICD-10-CM | POA: Diagnosis not present

## 2014-01-08 DIAGNOSIS — I4891 Unspecified atrial fibrillation: Secondary | ICD-10-CM | POA: Diagnosis not present

## 2014-01-08 DIAGNOSIS — I10 Essential (primary) hypertension: Secondary | ICD-10-CM | POA: Diagnosis not present

## 2014-01-08 DIAGNOSIS — I872 Venous insufficiency (chronic) (peripheral): Secondary | ICD-10-CM | POA: Diagnosis not present

## 2014-01-08 DIAGNOSIS — L97821 Non-pressure chronic ulcer of other part of left lower leg limited to breakdown of skin: Secondary | ICD-10-CM | POA: Diagnosis not present

## 2014-01-10 DIAGNOSIS — I10 Essential (primary) hypertension: Secondary | ICD-10-CM | POA: Diagnosis not present

## 2014-01-10 DIAGNOSIS — I739 Peripheral vascular disease, unspecified: Secondary | ICD-10-CM | POA: Diagnosis not present

## 2014-01-10 DIAGNOSIS — I4891 Unspecified atrial fibrillation: Secondary | ICD-10-CM | POA: Diagnosis not present

## 2014-01-10 DIAGNOSIS — I872 Venous insufficiency (chronic) (peripheral): Secondary | ICD-10-CM | POA: Diagnosis not present

## 2014-01-10 DIAGNOSIS — L97221 Non-pressure chronic ulcer of left calf limited to breakdown of skin: Secondary | ICD-10-CM | POA: Diagnosis not present

## 2014-01-10 DIAGNOSIS — L97821 Non-pressure chronic ulcer of other part of left lower leg limited to breakdown of skin: Secondary | ICD-10-CM | POA: Diagnosis not present

## 2014-01-11 DIAGNOSIS — F418 Other specified anxiety disorders: Secondary | ICD-10-CM | POA: Diagnosis not present

## 2014-01-11 DIAGNOSIS — E785 Hyperlipidemia, unspecified: Secondary | ICD-10-CM | POA: Diagnosis not present

## 2014-01-11 DIAGNOSIS — E041 Nontoxic single thyroid nodule: Secondary | ICD-10-CM | POA: Diagnosis not present

## 2014-01-11 DIAGNOSIS — D649 Anemia, unspecified: Secondary | ICD-10-CM | POA: Diagnosis not present

## 2014-01-11 DIAGNOSIS — R55 Syncope and collapse: Secondary | ICD-10-CM | POA: Diagnosis not present

## 2014-01-11 DIAGNOSIS — E279 Disorder of adrenal gland, unspecified: Secondary | ICD-10-CM | POA: Diagnosis not present

## 2014-01-11 DIAGNOSIS — Z1389 Encounter for screening for other disorder: Secondary | ICD-10-CM | POA: Diagnosis not present

## 2014-01-11 DIAGNOSIS — M859 Disorder of bone density and structure, unspecified: Secondary | ICD-10-CM | POA: Diagnosis not present

## 2014-01-11 DIAGNOSIS — C50919 Malignant neoplasm of unspecified site of unspecified female breast: Secondary | ICD-10-CM | POA: Diagnosis not present

## 2014-01-11 DIAGNOSIS — E2749 Other adrenocortical insufficiency: Secondary | ICD-10-CM | POA: Diagnosis not present

## 2014-01-11 DIAGNOSIS — I10 Essential (primary) hypertension: Secondary | ICD-10-CM | POA: Diagnosis not present

## 2014-01-14 DIAGNOSIS — L97821 Non-pressure chronic ulcer of other part of left lower leg limited to breakdown of skin: Secondary | ICD-10-CM | POA: Diagnosis not present

## 2014-01-14 DIAGNOSIS — I872 Venous insufficiency (chronic) (peripheral): Secondary | ICD-10-CM | POA: Diagnosis not present

## 2014-01-14 DIAGNOSIS — I739 Peripheral vascular disease, unspecified: Secondary | ICD-10-CM | POA: Diagnosis not present

## 2014-01-14 DIAGNOSIS — I10 Essential (primary) hypertension: Secondary | ICD-10-CM | POA: Diagnosis not present

## 2014-01-14 DIAGNOSIS — L97221 Non-pressure chronic ulcer of left calf limited to breakdown of skin: Secondary | ICD-10-CM | POA: Diagnosis not present

## 2014-01-14 DIAGNOSIS — I4891 Unspecified atrial fibrillation: Secondary | ICD-10-CM | POA: Diagnosis not present

## 2014-01-17 ENCOUNTER — Encounter (HOSPITAL_BASED_OUTPATIENT_CLINIC_OR_DEPARTMENT_OTHER): Payer: Medicare Other | Attending: Internal Medicine

## 2014-01-17 DIAGNOSIS — R600 Localized edema: Secondary | ICD-10-CM | POA: Diagnosis not present

## 2014-01-17 DIAGNOSIS — J189 Pneumonia, unspecified organism: Secondary | ICD-10-CM | POA: Diagnosis not present

## 2014-01-17 DIAGNOSIS — I87331 Chronic venous hypertension (idiopathic) with ulcer and inflammation of right lower extremity: Secondary | ICD-10-CM | POA: Diagnosis not present

## 2014-01-17 DIAGNOSIS — L97929 Non-pressure chronic ulcer of unspecified part of left lower leg with unspecified severity: Secondary | ICD-10-CM | POA: Diagnosis not present

## 2014-01-17 DIAGNOSIS — G629 Polyneuropathy, unspecified: Secondary | ICD-10-CM | POA: Diagnosis not present

## 2014-01-17 DIAGNOSIS — S81812A Laceration without foreign body, left lower leg, initial encounter: Secondary | ICD-10-CM | POA: Diagnosis not present

## 2014-01-17 DIAGNOSIS — Z6824 Body mass index (BMI) 24.0-24.9, adult: Secondary | ICD-10-CM | POA: Diagnosis not present

## 2014-01-17 DIAGNOSIS — W06XXXA Fall from bed, initial encounter: Secondary | ICD-10-CM | POA: Diagnosis not present

## 2014-01-17 DIAGNOSIS — F418 Other specified anxiety disorders: Secondary | ICD-10-CM | POA: Diagnosis not present

## 2014-01-17 DIAGNOSIS — I10 Essential (primary) hypertension: Secondary | ICD-10-CM | POA: Diagnosis not present

## 2014-01-17 NOTE — Progress Notes (Signed)
[facility]  NAME:  [name], [name] NO.:  [id number]  MEDICAL RECORD NO.:  [id number]      DATE OF BIRTH:  07-13-1934  PHYSICIAN:  Judene Companion, M.D.           VISIT DATE:                                  OFFICE VISIT   This is a 79 year old Caucasian female, who comes to [place] with a history of venous stasis and ulcers on both of her legs.  Today, she comes with a trauma wound to the anterior aspect of her left leg.  It is about 3 x 4 cm.  This in combination with her venous stasis has created an ulcer that shows good granulation tissue.  I am going to treat this with an Unna boot and collagen.  This lady has a history of left breast cancer, arthritis and COPD.  She has had chemo and radiation through her breast. She came here today with a blood pressure of 107/71, pulse 90, respirations 16, temperature 98.  She is 5 feet and 6 inches, weighs 157 pounds.  Her medicines include Cymbalta, vitamins, Vytorin, Neurontin, Levbid, Ativan, Keppra, Synthroid and prednisone.  She will come back in about a week for a new Unna boot and we will probably put her on for a Apligraf later.  So, her diagnosis is history of venous stasis bilaterally.  Today, she has a traumatic wound to the anterior aspect of her left leg with a venous ulcer with inflammation and hypertension. She has a history of left breast cancer and osteoporosis.     Judene Companion, M.D.     PP/MEDQ  D:  01/17/2014  T:  01/17/2014  Job:  [id number]

## 2014-01-21 ENCOUNTER — Other Ambulatory Visit: Payer: Self-pay | Admitting: *Deleted

## 2014-01-21 DIAGNOSIS — I872 Venous insufficiency (chronic) (peripheral): Secondary | ICD-10-CM | POA: Diagnosis not present

## 2014-01-21 DIAGNOSIS — R609 Edema, unspecified: Secondary | ICD-10-CM

## 2014-01-21 DIAGNOSIS — L97221 Non-pressure chronic ulcer of left calf limited to breakdown of skin: Secondary | ICD-10-CM | POA: Diagnosis not present

## 2014-01-21 DIAGNOSIS — I739 Peripheral vascular disease, unspecified: Secondary | ICD-10-CM | POA: Diagnosis not present

## 2014-01-21 DIAGNOSIS — I4891 Unspecified atrial fibrillation: Secondary | ICD-10-CM | POA: Diagnosis not present

## 2014-01-21 DIAGNOSIS — I10 Essential (primary) hypertension: Secondary | ICD-10-CM | POA: Diagnosis not present

## 2014-01-21 DIAGNOSIS — L97821 Non-pressure chronic ulcer of other part of left lower leg limited to breakdown of skin: Secondary | ICD-10-CM | POA: Diagnosis not present

## 2014-01-24 DIAGNOSIS — L97821 Non-pressure chronic ulcer of other part of left lower leg limited to breakdown of skin: Secondary | ICD-10-CM | POA: Diagnosis not present

## 2014-01-24 DIAGNOSIS — I4891 Unspecified atrial fibrillation: Secondary | ICD-10-CM | POA: Diagnosis not present

## 2014-01-24 DIAGNOSIS — I872 Venous insufficiency (chronic) (peripheral): Secondary | ICD-10-CM | POA: Diagnosis not present

## 2014-01-24 DIAGNOSIS — I10 Essential (primary) hypertension: Secondary | ICD-10-CM | POA: Diagnosis not present

## 2014-01-24 DIAGNOSIS — I739 Peripheral vascular disease, unspecified: Secondary | ICD-10-CM | POA: Diagnosis not present

## 2014-01-24 DIAGNOSIS — L97221 Non-pressure chronic ulcer of left calf limited to breakdown of skin: Secondary | ICD-10-CM | POA: Diagnosis not present

## 2014-01-28 DIAGNOSIS — L97221 Non-pressure chronic ulcer of left calf limited to breakdown of skin: Secondary | ICD-10-CM | POA: Diagnosis not present

## 2014-01-28 DIAGNOSIS — L97821 Non-pressure chronic ulcer of other part of left lower leg limited to breakdown of skin: Secondary | ICD-10-CM | POA: Diagnosis not present

## 2014-01-28 DIAGNOSIS — I739 Peripheral vascular disease, unspecified: Secondary | ICD-10-CM | POA: Diagnosis not present

## 2014-01-28 DIAGNOSIS — I10 Essential (primary) hypertension: Secondary | ICD-10-CM | POA: Diagnosis not present

## 2014-01-28 DIAGNOSIS — I872 Venous insufficiency (chronic) (peripheral): Secondary | ICD-10-CM | POA: Diagnosis not present

## 2014-01-28 DIAGNOSIS — I4891 Unspecified atrial fibrillation: Secondary | ICD-10-CM | POA: Diagnosis not present

## 2014-01-29 DIAGNOSIS — I739 Peripheral vascular disease, unspecified: Secondary | ICD-10-CM | POA: Diagnosis not present

## 2014-01-29 DIAGNOSIS — Z7901 Long term (current) use of anticoagulants: Secondary | ICD-10-CM | POA: Diagnosis not present

## 2014-01-29 DIAGNOSIS — I872 Venous insufficiency (chronic) (peripheral): Secondary | ICD-10-CM | POA: Diagnosis not present

## 2014-01-29 DIAGNOSIS — I4891 Unspecified atrial fibrillation: Secondary | ICD-10-CM | POA: Diagnosis not present

## 2014-01-29 DIAGNOSIS — L97821 Non-pressure chronic ulcer of other part of left lower leg limited to breakdown of skin: Secondary | ICD-10-CM | POA: Diagnosis not present

## 2014-01-29 DIAGNOSIS — I10 Essential (primary) hypertension: Secondary | ICD-10-CM | POA: Diagnosis not present

## 2014-01-29 DIAGNOSIS — L97221 Non-pressure chronic ulcer of left calf limited to breakdown of skin: Secondary | ICD-10-CM | POA: Diagnosis not present

## 2014-01-29 DIAGNOSIS — Z5181 Encounter for therapeutic drug level monitoring: Secondary | ICD-10-CM | POA: Diagnosis not present

## 2014-01-31 ENCOUNTER — Encounter (HOSPITAL_BASED_OUTPATIENT_CLINIC_OR_DEPARTMENT_OTHER): Payer: Medicare Other | Attending: Internal Medicine

## 2014-01-31 DIAGNOSIS — L97921 Non-pressure chronic ulcer of unspecified part of left lower leg limited to breakdown of skin: Secondary | ICD-10-CM | POA: Diagnosis not present

## 2014-01-31 DIAGNOSIS — I87332 Chronic venous hypertension (idiopathic) with ulcer and inflammation of left lower extremity: Secondary | ICD-10-CM | POA: Diagnosis not present

## 2014-02-01 DIAGNOSIS — H5010 Unspecified exotropia: Secondary | ICD-10-CM | POA: Diagnosis not present

## 2014-02-01 DIAGNOSIS — Z961 Presence of intraocular lens: Secondary | ICD-10-CM | POA: Diagnosis not present

## 2014-02-04 DIAGNOSIS — I10 Essential (primary) hypertension: Secondary | ICD-10-CM | POA: Diagnosis not present

## 2014-02-04 DIAGNOSIS — L97221 Non-pressure chronic ulcer of left calf limited to breakdown of skin: Secondary | ICD-10-CM | POA: Diagnosis not present

## 2014-02-04 DIAGNOSIS — I739 Peripheral vascular disease, unspecified: Secondary | ICD-10-CM | POA: Diagnosis not present

## 2014-02-04 DIAGNOSIS — L97821 Non-pressure chronic ulcer of other part of left lower leg limited to breakdown of skin: Secondary | ICD-10-CM | POA: Diagnosis not present

## 2014-02-04 DIAGNOSIS — I4891 Unspecified atrial fibrillation: Secondary | ICD-10-CM | POA: Diagnosis not present

## 2014-02-04 DIAGNOSIS — I872 Venous insufficiency (chronic) (peripheral): Secondary | ICD-10-CM | POA: Diagnosis not present

## 2014-02-07 DIAGNOSIS — L97921 Non-pressure chronic ulcer of unspecified part of left lower leg limited to breakdown of skin: Secondary | ICD-10-CM | POA: Diagnosis not present

## 2014-02-07 DIAGNOSIS — I87332 Chronic venous hypertension (idiopathic) with ulcer and inflammation of left lower extremity: Secondary | ICD-10-CM | POA: Diagnosis not present

## 2014-02-11 DIAGNOSIS — I739 Peripheral vascular disease, unspecified: Secondary | ICD-10-CM | POA: Diagnosis not present

## 2014-02-11 DIAGNOSIS — I872 Venous insufficiency (chronic) (peripheral): Secondary | ICD-10-CM | POA: Diagnosis not present

## 2014-02-11 DIAGNOSIS — I4891 Unspecified atrial fibrillation: Secondary | ICD-10-CM | POA: Diagnosis not present

## 2014-02-11 DIAGNOSIS — L97221 Non-pressure chronic ulcer of left calf limited to breakdown of skin: Secondary | ICD-10-CM | POA: Diagnosis not present

## 2014-02-11 DIAGNOSIS — I10 Essential (primary) hypertension: Secondary | ICD-10-CM | POA: Diagnosis not present

## 2014-02-11 DIAGNOSIS — L97821 Non-pressure chronic ulcer of other part of left lower leg limited to breakdown of skin: Secondary | ICD-10-CM | POA: Diagnosis not present

## 2014-02-13 DIAGNOSIS — L97921 Non-pressure chronic ulcer of unspecified part of left lower leg limited to breakdown of skin: Secondary | ICD-10-CM | POA: Diagnosis not present

## 2014-02-13 DIAGNOSIS — I87332 Chronic venous hypertension (idiopathic) with ulcer and inflammation of left lower extremity: Secondary | ICD-10-CM | POA: Diagnosis not present

## 2014-02-14 ENCOUNTER — Encounter: Payer: Self-pay | Admitting: Vascular Surgery

## 2014-02-15 ENCOUNTER — Encounter: Payer: Self-pay | Admitting: Vascular Surgery

## 2014-02-15 ENCOUNTER — Ambulatory Visit (HOSPITAL_COMMUNITY)
Admission: RE | Admit: 2014-02-15 | Discharge: 2014-02-15 | Disposition: A | Discharge status: Home / Self Care | Payer: Medicare Other | Source: Ambulatory Visit | Attending: Vascular Surgery | Admitting: Vascular Surgery

## 2014-02-15 ENCOUNTER — Ambulatory Visit (INDEPENDENT_AMBULATORY_CARE_PROVIDER_SITE_OTHER): Payer: Medicare Other | Admitting: Vascular Surgery

## 2014-02-15 VITALS — BP 107/57 | HR 91 | Ht 66.0 in | Wt 162.9 lb

## 2014-02-15 DIAGNOSIS — R609 Edema, unspecified: Secondary | ICD-10-CM | POA: Diagnosis not present

## 2014-02-15 DIAGNOSIS — I872 Venous insufficiency (chronic) (peripheral): Secondary | ICD-10-CM | POA: Insufficient documentation

## 2014-02-15 NOTE — Assessment & Plan Note (Signed)
The patient has a wound on her anterior lateral aspect of her left leg which appears to be improving with Unna boots. She does have evidence of significant deep vein reflux on the left and we have discussed the importance of compression therapy and intermittent leg elevation. We have discussed the proper positioning for this. Based on her exam, she has biphasic Doppler signals in both feet side think she likely has adequate arterial flow to heal these wounds. If she developed further wounds then we could obtain a formal arterial Doppler study but at this point given that the wound is healing and she has biphasic signals in her feet I do not think this is necessary at this point. If she develops recurrent wounds or the current wound does not heal then certainly I'll be happy to see her back and proceed with a more aggressive arterial workup. In the meantime she'll continue with Unna boots and intermittent leg elevation.

## 2014-02-15 NOTE — Progress Notes (Signed)
Patient ID: [name], female   DOB: 1934-11-27, 79 y.o.   MRN: [id number]  Reason for Consult: Ulcer left leg   Referred by Sheela Stack, MD  Subjective:     HPI:  [name] is a 79 y.o. female Who is referred for evaluation of a left leg wound. She has known venous disease. She apparently had a DVT in February of this year and was on Coumadin for approximately 6 months. She tells me that she's been having wounds on both legs off and on for over a year. Currently she only has a wound on the anterior lateral aspect of her left leg. She is followed in the wound care center and this has been treated with an Haematologist. I do not get any history of claudication or rest pain in either lower extremity. She does have a history of neuropathy. She has been also elevating her legs some. She denies fever or chills.  Past Medical History  Diagnosis Date  . IBS (irritable bowel syndrome)   . Fibromyalgia   . History of DVT (deep vein thrombosis)   . History of pulmonary embolism   . Osteoporosis   . History of breast cancer LEFT BREAST DCIS  1996  . Hyperlipidemia   . 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 Breast cancer RIGHT SIDE-- DX OCT 2011    CHEMORADIATION THERAPY-- COMPLETED   . PONV (postoperative nausea and vomiting)   . Skin tear LEFT ARM COVERED W/ TEGADERM    PT STATES HX MULTIPLE SKIN TEARS -- THIN  . Thin skin SECONARDY AGE AND HX CHEMO  . Ecchymosis   . Seasonal allergies   . Frequency of urination   . Nocturia   . Anemia   . Lung mass PER DR CLANCE NOTE UNCLEAR IF LYMPHOMA FROM BX    FOLLOWED BY DR RUBIN  . Seizures   . Unspecified hereditary and idiopathic peripheral neuropathy   . Asthma   . Fibromyalgia   . Vertebral compression fracture 5/14    L4  . CHF (congestive heart failure)    Family History  Problem Relation Age of Onset  . Emphysema Father   . CAD Father   . Ovarian cancer Mother   . Cancer  Mother     cervical  . Alzheimer's disease Mother   . Hyperlipidemia Mother   . Breast cancer Maternal Grandmother   . Cancer Sister     cervical   Past Surgical History  Procedure Laterality Date  . Total abdominal hysterectomy w/ bilateral salpingoophorectomy  1977  (APPROX)  . Cholecystectomy    . Appendectomy    . Femur fracture surgery  12-18-2010  DR BEANE    INTRAMEDULLARY NAILING LEFT INTERTROCHANTERIC -SUBTROCHANTERIC FX  . Right breast lumpectomy / removal lymph nodes x2/ removal pac  10-01-2010    CARCINOMA RIGHT BREAST  . Placement port- a- cath  02-18-2010  . Bronchoscopy  01-29-2010    W/ BX  . Tvt vaginal tape  suburethral sling   06-08-2005    SUI  . Bilateral laminectomy/ foraminotomy  01-14-2004    L4 - L5  . Breast lumpectomy  1996     LEFT BREAST DCIS   . Cataract extraction w/ intraocular lens  implant, bilateral    . Bilateral carpal tunnel release    . Tonsillectomy    . Transthoracic echocardiogram  02-25-2010    LVSF NORMAL/ EF 95-62%/ GRADE I DIASTOLIC  DYSFUNCTION/ MILDLY DILATED LEFT ATRIUM  . Hardware removal  10/11/2011    Procedure: HARDWARE REMOVAL;  Surgeon: Johnn Hai, MD;  Location: [facility];  Service: Orthopedics;  Laterality: Left;  LEFT THIGH REMOVAL OF DISTAL LOCKING SCREW NEEDS: FLORO, RADIO LUSCENT TABLE AND SCREWDRIVER FOR BIOMET ROD   . Colonoscopy    . Esophagogastroduodenoscopy N/A 03/11/2013    Procedure: ESOPHAGOGASTRODUODENOSCOPY (EGD);  Surgeon: Lear Ng, MD;  Location: [facility];  Service: Endoscopy;  Laterality: N/A;    Short Social History:  History  Substance Use Topics  . Smoking status: Former Smoker -- 1.00 packs/day for 30 years    Types: Cigarettes    Quit date: 03/01/1978  . Smokeless tobacco: Never Used  . Alcohol Use: 4.2 - 6.0 oz/week    7-10 Glasses of wine per week    Allergies  Allergen Reactions  . Penicillins Anaphylaxis  . Adhesive [Tape] Other (See Comments)     Blisters   . Doxycycline Nausea And Vomiting  . Morphine And Related Other (See Comments)    HALLUCINATIONS    Current Outpatient Prescriptions  Medication Sig Dispense Refill  . ergocalciferol (VITAMIN D2) 50000 UNITS capsule Take 50,000 Units by mouth once a week. tuesday    . ezetimibe-simvastatin (VYTORIN) 10-80 MG per tablet Take 1 tablet by mouth at bedtime. For hyperlipidemia    . hyoscyamine (LEVBID) 0.375 MG 12 hr tablet Take 1 tablet by mouth daily. For bowel spasms.    Marland Kitchen levETIRAcetam (KEPPRA) 250 MG tablet Take 250 mg by mouth 2 (two) times daily.    Marland Kitchen levothyroxine (SYNTHROID, LEVOTHROID) 88 MCG tablet Take 88 mcg by mouth daily before breakfast. For hypothyroid    . LORazepam (ATIVAN) 0.5 MG tablet Take 0.5 mg by mouth daily as needed. Anxiety    . potassium chloride SA (K-DUR,KLOR-CON) 20 MEQ tablet Take 20 mEq by mouth daily.    . predniSONE (DELTASONE) 5 MG tablet Take 5 mg by mouth daily.    Marland Kitchen UCERIS 9 MG TB24 Take 3 capsules by mouth daily.    Marland Kitchen dexlansoprazole (DEXILANT) 60 MG capsule Take 60 mg by mouth as needed.     . DULoxetine (CYMBALTA) 60 MG capsule Take 60 mg by mouth daily after lunch.     . gabapentin (NEURONTIN) 100 MG capsule Take 100 mg by mouth 3 (three) times daily. May titrate up to 3 capsules three times a day    . HYDROmorphone (DILAUDID) 2 MG tablet Take 2 mg by mouth every 6 (six) hours as needed.    . warfarin (COUMADIN) 4 MG tablet Take 4 mg by mouth daily.     No current facility-administered medications for this visit.   Facility-Administered Medications Ordered in Other Visits  Medication Dose Route Frequency Provider Last Rate Last Dose  . fentaNYL (SUBLIMAZE) injection 25-50 mcg  25-50 mcg Intravenous Q5 min PRN Peyton Najjar, MD      . lactated ringers infusion   Intravenous Continuous Peyton Najjar, MD        Review of Systems  Constitutional: Negative for chills and fever.  Eyes: Negative for loss of vision.  Respiratory:  Positive for wheezing. Negative for cough.  Cardiovascular: Positive for dyspnea with exertion, leg swelling and palpitations. Negative for chest pain, chest tightness, claudication and orthopnea.  GI: Negative for blood in stool and vomiting.  GU: Negative for dysuria and hematuria.  Musculoskeletal: Negative for leg pain, joint pain and myalgias.  Skin: Positive for  wound. Negative for rash.  Neurological: Positive for numbness. Negative for dizziness and speech difficulty.  Hematologic: Negative for bruises/bleeds easily. Psychiatric: Negative for depressed mood.        Objective:  Objective  Filed Vitals:   02/15/14 1307  BP: 107/57  Pulse: 91  Height: 5\' 6"  (1.676 m)  Weight: 162 lb 14.4 oz (73.891 kg)  SpO2: 100%   Body mass index is 26.31 kg/(m^2).  Physical Exam  Constitutional: She is oriented to person, place, and time. She appears well-developed and well-nourished.  HENT:  Head: Normocephalic and atraumatic.  Neck: Neck supple. No JVD present. No thyromegaly present.  Cardiovascular: Normal rate, regular rhythm and normal heart sounds.  Exam reveals no friction rub.   No murmur heard. Pulses:      Femoral pulses are 2+ on the right side, and 2+ on the left side.      Popliteal pulses are 0 on the right side, and 0 on the left side.       Dorsalis pedis pulses are 0 on the right side, and 0 on the left side.       Posterior tibial pulses are 0 on the right side, and 0 on the left side.  I do not detect carotid bruits. She has a biphasic dorsalis pedis and posterior tibial signal bilaterally.  Pulmonary/Chest: Breath sounds normal. She has no wheezes. She has no rales.  Abdominal: Soft. Bowel sounds are normal. There is no tenderness.  I do not palpate an aneurysm.  Musculoskeletal: Normal range of motion. She exhibits no edema.  Lymphadenopathy:    She has no cervical adenopathy.  Neurological: She is alert and oriented to person, place, and time. She has normal  strength. No sensory deficit.  Skin: No lesion and no rash noted.  She has hyperpigmentation bilaterally consistent with chronic venous insufficiency. There is a small wound on the anterolateral aspect of her left leg which is granulating nicely and has almost healed.  Psychiatric: She has a normal mood and affect.   Data: VENOUS DUPLEX: I have independently interpreted her venous duplex scan. On the left side, which is the side with the ulcer, she does have reflux in the common femoral vein femoral vein and popliteal vein. There is no reflux in the saphenous vein. She does have some chronic partially occlusive thrombus in her left femoral vein. On the right side there is no evidence of DVT. She does have some deep vein reflux in the common femoral vein. There is no reflux in the great saphenous vein or small saphenous vein on the right.      Assessment/Plan:     Chronic venous insufficiency The patient has a wound on her anterior lateral aspect of her left leg which appears to be improving with Unna boots. She does have evidence of significant deep vein reflux on the left and we have discussed the importance of compression therapy and intermittent leg elevation. We have discussed the proper positioning for this. Based on her exam, she has biphasic Doppler signals in both feet side think she likely has adequate arterial flow to heal these wounds. If she developed further wounds then we could obtain a formal arterial Doppler study but at this point given that the wound is healing and she has biphasic signals in her feet I do not think this is necessary at this point. If she develops recurrent wounds or the current wound does not heal then certainly I'll be happy to see her back  and proceed with a more aggressive arterial workup. In the meantime she'll continue with Unna boots and intermittent leg elevation.    Angelia Mould MD [facility]

## 2014-02-18 DIAGNOSIS — I10 Essential (primary) hypertension: Secondary | ICD-10-CM | POA: Diagnosis not present

## 2014-02-18 DIAGNOSIS — L97221 Non-pressure chronic ulcer of left calf limited to breakdown of skin: Secondary | ICD-10-CM | POA: Diagnosis not present

## 2014-02-18 DIAGNOSIS — I4891 Unspecified atrial fibrillation: Secondary | ICD-10-CM | POA: Diagnosis not present

## 2014-02-18 DIAGNOSIS — I872 Venous insufficiency (chronic) (peripheral): Secondary | ICD-10-CM | POA: Diagnosis not present

## 2014-02-18 DIAGNOSIS — I739 Peripheral vascular disease, unspecified: Secondary | ICD-10-CM | POA: Diagnosis not present

## 2014-02-18 DIAGNOSIS — L97821 Non-pressure chronic ulcer of other part of left lower leg limited to breakdown of skin: Secondary | ICD-10-CM | POA: Diagnosis not present

## 2014-02-20 DIAGNOSIS — I87332 Chronic venous hypertension (idiopathic) with ulcer and inflammation of left lower extremity: Secondary | ICD-10-CM | POA: Diagnosis not present

## 2014-02-20 DIAGNOSIS — L97921 Non-pressure chronic ulcer of unspecified part of left lower leg limited to breakdown of skin: Secondary | ICD-10-CM | POA: Diagnosis not present

## 2014-02-27 DIAGNOSIS — I87332 Chronic venous hypertension (idiopathic) with ulcer and inflammation of left lower extremity: Secondary | ICD-10-CM | POA: Diagnosis not present

## 2014-02-27 DIAGNOSIS — L97921 Non-pressure chronic ulcer of unspecified part of left lower leg limited to breakdown of skin: Secondary | ICD-10-CM | POA: Diagnosis not present

## 2014-03-07 DIAGNOSIS — R079 Chest pain, unspecified: Secondary | ICD-10-CM | POA: Diagnosis not present

## 2014-03-07 DIAGNOSIS — K449 Diaphragmatic hernia without obstruction or gangrene: Secondary | ICD-10-CM | POA: Diagnosis not present

## 2014-03-12 DIAGNOSIS — I739 Peripheral vascular disease, unspecified: Secondary | ICD-10-CM | POA: Diagnosis not present

## 2014-03-12 DIAGNOSIS — I872 Venous insufficiency (chronic) (peripheral): Secondary | ICD-10-CM | POA: Diagnosis not present

## 2014-03-12 DIAGNOSIS — L97821 Non-pressure chronic ulcer of other part of left lower leg limited to breakdown of skin: Secondary | ICD-10-CM | POA: Diagnosis not present

## 2014-03-12 DIAGNOSIS — I4891 Unspecified atrial fibrillation: Secondary | ICD-10-CM | POA: Diagnosis not present

## 2014-03-12 DIAGNOSIS — I10 Essential (primary) hypertension: Secondary | ICD-10-CM | POA: Diagnosis not present

## 2014-03-12 DIAGNOSIS — L97221 Non-pressure chronic ulcer of left calf limited to breakdown of skin: Secondary | ICD-10-CM | POA: Diagnosis not present

## 2014-03-13 ENCOUNTER — Other Ambulatory Visit: Payer: Self-pay | Admitting: Neurology

## 2014-03-13 DIAGNOSIS — M810 Age-related osteoporosis without current pathological fracture: Secondary | ICD-10-CM | POA: Diagnosis not present

## 2014-03-13 DIAGNOSIS — R0781 Pleurodynia: Secondary | ICD-10-CM | POA: Diagnosis not present

## 2014-03-18 DIAGNOSIS — L97821 Non-pressure chronic ulcer of other part of left lower leg limited to breakdown of skin: Secondary | ICD-10-CM | POA: Diagnosis not present

## 2014-03-18 DIAGNOSIS — I739 Peripheral vascular disease, unspecified: Secondary | ICD-10-CM | POA: Diagnosis not present

## 2014-03-18 DIAGNOSIS — I872 Venous insufficiency (chronic) (peripheral): Secondary | ICD-10-CM | POA: Diagnosis not present

## 2014-03-18 DIAGNOSIS — I10 Essential (primary) hypertension: Secondary | ICD-10-CM | POA: Diagnosis not present

## 2014-03-18 DIAGNOSIS — I4891 Unspecified atrial fibrillation: Secondary | ICD-10-CM | POA: Diagnosis not present

## 2014-03-18 DIAGNOSIS — L97221 Non-pressure chronic ulcer of left calf limited to breakdown of skin: Secondary | ICD-10-CM | POA: Diagnosis not present

## 2014-03-20 DIAGNOSIS — I4891 Unspecified atrial fibrillation: Secondary | ICD-10-CM | POA: Diagnosis not present

## 2014-03-20 DIAGNOSIS — I10 Essential (primary) hypertension: Secondary | ICD-10-CM | POA: Diagnosis not present

## 2014-03-20 DIAGNOSIS — L97221 Non-pressure chronic ulcer of left calf limited to breakdown of skin: Secondary | ICD-10-CM | POA: Diagnosis not present

## 2014-03-20 DIAGNOSIS — L97821 Non-pressure chronic ulcer of other part of left lower leg limited to breakdown of skin: Secondary | ICD-10-CM | POA: Diagnosis not present

## 2014-03-20 DIAGNOSIS — I872 Venous insufficiency (chronic) (peripheral): Secondary | ICD-10-CM | POA: Diagnosis not present

## 2014-03-20 DIAGNOSIS — I739 Peripheral vascular disease, unspecified: Secondary | ICD-10-CM | POA: Diagnosis not present

## 2014-03-29 DIAGNOSIS — I4891 Unspecified atrial fibrillation: Secondary | ICD-10-CM | POA: Diagnosis not present

## 2014-03-29 DIAGNOSIS — I739 Peripheral vascular disease, unspecified: Secondary | ICD-10-CM | POA: Diagnosis not present

## 2014-03-29 DIAGNOSIS — L97821 Non-pressure chronic ulcer of other part of left lower leg limited to breakdown of skin: Secondary | ICD-10-CM | POA: Diagnosis not present

## 2014-03-29 DIAGNOSIS — I872 Venous insufficiency (chronic) (peripheral): Secondary | ICD-10-CM | POA: Diagnosis not present

## 2014-03-29 DIAGNOSIS — L97221 Non-pressure chronic ulcer of left calf limited to breakdown of skin: Secondary | ICD-10-CM | POA: Diagnosis not present

## 2014-03-29 DIAGNOSIS — I10 Essential (primary) hypertension: Secondary | ICD-10-CM | POA: Diagnosis not present

## 2014-04-04 ENCOUNTER — Ambulatory Visit (HOSPITAL_COMMUNITY)
Admission: RE | Admit: 2014-04-04 | Discharge: 2014-04-04 | Disposition: A | Discharge status: Home / Self Care | Payer: Medicare Other | Source: Ambulatory Visit | Attending: Endocrinology | Admitting: Endocrinology

## 2014-04-04 ENCOUNTER — Encounter (HOSPITAL_COMMUNITY): Payer: Self-pay

## 2014-04-04 ENCOUNTER — Other Ambulatory Visit (HOSPITAL_COMMUNITY): Payer: Self-pay | Admitting: Endocrinology

## 2014-04-04 DIAGNOSIS — M810 Age-related osteoporosis without current pathological fracture: Secondary | ICD-10-CM | POA: Diagnosis not present

## 2014-04-04 MED ORDER — ZOLEDRONIC ACID 5 MG/100ML IV SOLN
5.0000 mg | Freq: Once | INTRAVENOUS | Status: AC
  Administered 2014-04-04: 5 mg via INTRAVENOUS
  Filled 2014-04-04: qty 100

## 2014-04-04 MED ORDER — SODIUM CHLORIDE 0.9 % IV SOLN
Freq: Once | INTRAVENOUS | Status: AC
  Administered 2014-04-04: 13:00:00 via INTRAVENOUS

## 2014-04-04 NOTE — Progress Notes (Signed)
Pt states she" feels fine now". Uneventful infusion of RECLAST. Pt discharged via w/c accompanied by staff to go to car at [place]

## 2014-04-04 NOTE — Discharge Instructions (Signed)
RECLAST °Zoledronic Acid injection (Paget's Disease, Osteoporosis) °What is this medicine? °ZOLEDRONIC ACID (ZOE le dron ik AS id) lowers the amount of calcium loss from bone. It is used to treat Paget's disease and osteoporosis in women. °This medicine may be used for other purposes; ask your health care provider or pharmacist if you have questions. °COMMON BRAND NAME(S): Reclast, Zometa °What should I tell my health care provider before I take this medicine? °They need to know if you have any of these conditions: °-aspirin-sensitive asthma °-cancer, especially if you are receiving medicines used to treat cancer °-dental disease or wear dentures °-infection °-kidney disease °-low levels of calcium in the blood °-past surgery on the parathyroid gland or intestines °-receiving corticosteroids like dexamethasone or prednisone °-an unusual or allergic reaction to zoledronic acid, other medicines, foods, dyes, or preservatives °-pregnant or trying to get pregnant °-breast-feeding °How should I use this medicine? °This medicine is for infusion into a vein. It is given by a health care professional in a hospital or clinic setting. °Talk to your pediatrician regarding the use of this medicine in children. This medicine is not approved for use in children. °Overdosage: If you think you have taken too much of this medicine contact a poison control center or emergency room at once. °NOTE: This medicine is only for you. Do not share this medicine with others. °What if I miss a dose? °It is important not to miss your dose. Call your doctor or health care professional if you are unable to keep an appointment. °What may interact with this medicine? °-certain antibiotics given by injection °-NSAIDs, medicines for pain and inflammation, like ibuprofen or naproxen °-some diuretics like bumetanide, furosemide °-teriparatide °This list may not describe all possible interactions. Give your health care provider a list of all the  medicines, herbs, non-prescription drugs, or dietary supplements you use. Also tell them if you smoke, drink alcohol, or use illegal drugs. Some items may interact with your medicine. °What should I watch for while using this medicine? °Visit your doctor or health care professional for regular checkups. It may be some time before you see the benefit from this medicine. Do not stop taking your medicine unless your doctor tells you to. Your doctor may order blood tests or other tests to see how you are doing. °Women should inform their doctor if they wish to become pregnant or think they might be pregnant. There is a potential for serious side effects to an unborn child. Talk to your health care professional or pharmacist for more information. °You should make sure that you get enough calcium and vitamin D while you are taking this medicine. Discuss the foods you eat and the vitamins you take with your health care professional. °Some people who take this medicine have severe bone, joint, and/or muscle pain. This medicine may also increase your risk for jaw problems or a broken thigh bone. Tell your doctor right away if you have severe pain in your jaw, bones, joints, or muscles. Tell your doctor if you have any pain that does not go away or that gets worse. °Tell your dentist and dental surgeon that you are taking this medicine. You should not have major dental surgery while on this medicine. See your dentist to have a dental exam and fix any dental problems before starting this medicine. Take good care of your teeth while on this medicine. Make sure you see your dentist for regular follow-up appointments. °What side effects may I notice from receiving this   medicine? °Side effects that you should report to your doctor or health care professional as soon as possible: °-allergic reactions like skin rash, itching or hives, swelling of the face, lips, or tongue °-anxiety, confusion, or depression °-breathing  problems °-changes in vision °-eye pain °-feeling faint or lightheaded, falls °-jaw pain, especially after dental work °-mouth sores °-muscle cramps, stiffness, or weakness °-trouble passing urine or change in the amount of urine °Side effects that usually do not require medical attention (report to your doctor or health care professional if they continue or are bothersome): °-bone, joint, or muscle pain °-constipation °-diarrhea °-fever °-hair loss °-irritation at site where injected °-loss of appetite °-nausea, vomiting °-stomach upset °-trouble sleeping °-trouble swallowing °-weak or tired °This list may not describe all possible side effects. Call your doctor for medical advice about side effects. You may report side effects to FDA at 1-800-FDA-1088. °Where should I keep my medicine? °This drug is given in a hospital or clinic and will not be stored at home. °NOTE: This sheet is a summary. It may not cover all possible information. If you have questions about this medicine, talk to your doctor, pharmacist, or health care provider. °© 2015, [employer]. (2012-07-31 10:03:48) °Osteoporosis °Throughout your life, your body breaks down old bone and replaces it with new bone. As you get older, your body does not replace bone as quickly as it breaks it down. By the age of 30 years, most people begin to gradually lose bone because of the imbalance between bone loss and replacement. Some people lose more bone than others. Bone loss beyond a specified normal degree is considered osteoporosis.  °Osteoporosis affects the strength and durability of your bones. The inside of the ends of your bones and your flat bones, like the bones of your pelvis, look like honeycomb, filled with tiny open spaces. As bone loss occurs, your bones become less dense. This means that the open spaces inside your bones become bigger and the walls between these spaces become thinner. This makes your bones weaker. Bones of a person with  osteoporosis can become so weak that they can break (fracture) during minor accidents, such as a simple fall. °CAUSES  °The following factors have been associated with the development of osteoporosis: °· Smoking. °· Drinking more than 2 alcoholic drinks several days per week. °· Long-term use of certain medicines: °¨ Corticosteroids. °¨ Chemotherapy medicines. °¨ Thyroid medicines. °¨ Antiepileptic medicines. °¨ Gonadal hormone suppression medicine. °¨ Immunosuppression medicine. °· Being underweight. °· Lack of physical activity. °· Lack of exposure to the sun. This can lead to vitamin D deficiency. °· Certain medical conditions: °¨ Certain inflammatory bowel diseases, such as Crohn disease and ulcerative colitis. °¨ Diabetes. °¨ Hyperthyroidism. °¨ Hyperparathyroidism. °RISK FACTORS °Anyone can develop osteoporosis. However, the following factors can increase your risk of developing osteoporosis: °· Gender--Women are at higher risk than men. °· Age--Being older than 50 years increases your risk. °· Ethnicity--White and Asian people have an increased risk. °· Weight --Being extremely underweight can increase your risk of osteoporosis. °· Family history of osteoporosis--Having a family member who has developed osteoporosis can increase your risk. °SYMPTOMS  °Usually, people with osteoporosis have no symptoms.  °DIAGNOSIS  °Signs during a physical exam that may prompt your caregiver to suspect osteoporosis include: °· Decreased height. This is usually caused by the compression of the bones that form your spine (vertebrae) because they have weakened and become fractured. °· A curving or rounding of the upper back (kyphosis). °  To confirm signs of osteoporosis, your caregiver may request a procedure that uses 2 low-dose X-ray beams with different levels of energy to measure your bone mineral density (dual-energy X-ray absorptiometry [DXA]). Also, your caregiver may check your level of vitamin D. °TREATMENT  °The goal of  osteoporosis treatment is to strengthen bones in order to decrease the risk of bone fractures. There are different types of medicines available to help achieve this goal. Some of these medicines work by slowing the processes of bone loss. Some medicines work by increasing bone density. Treatment also involves making sure that your levels of calcium and vitamin D are adequate. °PREVENTION  °There are things you can do to help prevent osteoporosis. Adequate intake of calcium and vitamin D can help you achieve optimal bone mineral density. Regular exercise can also help, especially resistance and weight-bearing activities. If you smoke, quitting smoking is an important part of osteoporosis prevention. °MAKE SURE YOU: °· Understand these instructions. °· Will watch your condition. °· Will get help right away if you are not doing well or get worse. °FOR MORE INFORMATION °www.osteo.org and www.nof.org °Document Released: 11/25/2004 Document Revised: 06/12/2012 Document Reviewed: 01/30/2011 °[employer]® Patient Information ©2015 [employer]. This information is not intended to replace advice given to you by your health care provider. Make sure you discuss any questions you have with your health care provider. ° ° °

## 2014-04-04 NOTE — Progress Notes (Addendum)
Pt here for her RECLAST infusion.She states she does not take calcium supplements but does take it in dietary form of cheese. Pt is to contact her PCP for any questions or concerns. Pt states she is feeling a little dizzy ,prior to IV start,. She states she has not had breakfast or lunch. VSS warm and dry color pink. Pt placed in recliner in reclined position and given cheese and crackers. After 15 minutes states she is feeling better. IV started and pt receiving her RECLAST as ordered

## 2014-04-09 ENCOUNTER — Inpatient Hospital Stay (HOSPITAL_COMMUNITY)
Admission: EM | Admit: 2014-04-09 | Discharge: 2014-04-18 | DRG: 194 | Disposition: A | Discharge status: Skilled Nursing Facility | Payer: Medicare Other | Attending: Endocrinology | Admitting: Endocrinology

## 2014-04-09 ENCOUNTER — Emergency Department (HOSPITAL_COMMUNITY): Payer: Medicare Other

## 2014-04-09 ENCOUNTER — Encounter (HOSPITAL_COMMUNITY): Payer: Self-pay | Admitting: Emergency Medicine

## 2014-04-09 DIAGNOSIS — I471 Supraventricular tachycardia: Secondary | ICD-10-CM | POA: Diagnosis present

## 2014-04-09 DIAGNOSIS — C50919 Malignant neoplasm of unspecified site of unspecified female breast: Secondary | ICD-10-CM | POA: Diagnosis not present

## 2014-04-09 DIAGNOSIS — K2971 Gastritis, unspecified, with bleeding: Secondary | ICD-10-CM | POA: Diagnosis not present

## 2014-04-09 DIAGNOSIS — Z803 Family history of malignant neoplasm of breast: Secondary | ICD-10-CM

## 2014-04-09 DIAGNOSIS — R2681 Unsteadiness on feet: Secondary | ICD-10-CM | POA: Diagnosis not present

## 2014-04-09 DIAGNOSIS — G620 Drug-induced polyneuropathy: Secondary | ICD-10-CM | POA: Diagnosis present

## 2014-04-09 DIAGNOSIS — K219 Gastro-esophageal reflux disease without esophagitis: Secondary | ICD-10-CM | POA: Diagnosis present

## 2014-04-09 DIAGNOSIS — G8929 Other chronic pain: Secondary | ICD-10-CM | POA: Diagnosis present

## 2014-04-09 DIAGNOSIS — K449 Diaphragmatic hernia without obstruction or gangrene: Secondary | ICD-10-CM | POA: Diagnosis present

## 2014-04-09 DIAGNOSIS — R7301 Impaired fasting glucose: Secondary | ICD-10-CM | POA: Diagnosis not present

## 2014-04-09 DIAGNOSIS — K635 Polyp of colon: Secondary | ICD-10-CM | POA: Diagnosis present

## 2014-04-09 DIAGNOSIS — T451X5A Adverse effect of antineoplastic and immunosuppressive drugs, initial encounter: Secondary | ICD-10-CM | POA: Diagnosis present

## 2014-04-09 DIAGNOSIS — I4510 Unspecified right bundle-branch block: Secondary | ICD-10-CM | POA: Diagnosis present

## 2014-04-09 DIAGNOSIS — E440 Moderate protein-calorie malnutrition: Secondary | ICD-10-CM | POA: Diagnosis present

## 2014-04-09 DIAGNOSIS — M546 Pain in thoracic spine: Secondary | ICD-10-CM | POA: Diagnosis not present

## 2014-04-09 DIAGNOSIS — F419 Anxiety disorder, unspecified: Secondary | ICD-10-CM | POA: Diagnosis not present

## 2014-04-09 DIAGNOSIS — E2749 Other adrenocortical insufficiency: Secondary | ICD-10-CM | POA: Diagnosis present

## 2014-04-09 DIAGNOSIS — I82409 Acute embolism and thrombosis of unspecified deep veins of unspecified lower extremity: Secondary | ICD-10-CM | POA: Diagnosis present

## 2014-04-09 DIAGNOSIS — D649 Anemia, unspecified: Secondary | ICD-10-CM | POA: Diagnosis not present

## 2014-04-09 DIAGNOSIS — A419 Sepsis, unspecified organism: Secondary | ICD-10-CM | POA: Diagnosis present

## 2014-04-09 DIAGNOSIS — G4733 Obstructive sleep apnea (adult) (pediatric): Secondary | ICD-10-CM | POA: Diagnosis not present

## 2014-04-09 DIAGNOSIS — J439 Emphysema, unspecified: Secondary | ICD-10-CM | POA: Diagnosis not present

## 2014-04-09 DIAGNOSIS — K589 Irritable bowel syndrome without diarrhea: Secondary | ICD-10-CM | POA: Diagnosis present

## 2014-04-09 DIAGNOSIS — M810 Age-related osteoporosis without current pathological fracture: Secondary | ICD-10-CM | POA: Diagnosis present

## 2014-04-09 DIAGNOSIS — D124 Benign neoplasm of descending colon: Secondary | ICD-10-CM | POA: Diagnosis present

## 2014-04-09 DIAGNOSIS — D509 Iron deficiency anemia, unspecified: Secondary | ICD-10-CM | POA: Diagnosis present

## 2014-04-09 DIAGNOSIS — Z7982 Long term (current) use of aspirin: Secondary | ICD-10-CM

## 2014-04-09 DIAGNOSIS — Z6826 Body mass index (BMI) 26.0-26.9, adult: Secondary | ICD-10-CM

## 2014-04-09 DIAGNOSIS — Z853 Personal history of malignant neoplasm of breast: Secondary | ICD-10-CM

## 2014-04-09 DIAGNOSIS — J90 Pleural effusion, not elsewhere classified: Secondary | ICD-10-CM

## 2014-04-09 DIAGNOSIS — G629 Polyneuropathy, unspecified: Secondary | ICD-10-CM

## 2014-04-09 DIAGNOSIS — M8080XS Other osteoporosis with current pathological fracture, unspecified site, sequela: Secondary | ICD-10-CM | POA: Diagnosis not present

## 2014-04-09 DIAGNOSIS — Z86718 Personal history of other venous thrombosis and embolism: Secondary | ICD-10-CM | POA: Diagnosis not present

## 2014-04-09 DIAGNOSIS — Z86711 Personal history of pulmonary embolism: Secondary | ICD-10-CM

## 2014-04-09 DIAGNOSIS — R7302 Impaired glucose tolerance (oral): Secondary | ICD-10-CM | POA: Diagnosis present

## 2014-04-09 DIAGNOSIS — Z9049 Acquired absence of other specified parts of digestive tract: Secondary | ICD-10-CM | POA: Diagnosis present

## 2014-04-09 DIAGNOSIS — K5289 Other specified noninfective gastroenteritis and colitis: Secondary | ICD-10-CM | POA: Diagnosis present

## 2014-04-09 DIAGNOSIS — Z8249 Family history of ischemic heart disease and other diseases of the circulatory system: Secondary | ICD-10-CM

## 2014-04-09 DIAGNOSIS — I509 Heart failure, unspecified: Secondary | ICD-10-CM | POA: Diagnosis not present

## 2014-04-09 DIAGNOSIS — R0600 Dyspnea, unspecified: Secondary | ICD-10-CM | POA: Diagnosis not present

## 2014-04-09 DIAGNOSIS — F32A Depression, unspecified: Secondary | ICD-10-CM | POA: Diagnosis present

## 2014-04-09 DIAGNOSIS — G40909 Epilepsy, unspecified, not intractable, without status epilepticus: Secondary | ICD-10-CM | POA: Diagnosis present

## 2014-04-09 DIAGNOSIS — R0902 Hypoxemia: Secondary | ICD-10-CM

## 2014-04-09 DIAGNOSIS — E039 Hypothyroidism, unspecified: Secondary | ICD-10-CM | POA: Diagnosis not present

## 2014-04-09 DIAGNOSIS — Z8041 Family history of malignant neoplasm of ovary: Secondary | ICD-10-CM

## 2014-04-09 DIAGNOSIS — E785 Hyperlipidemia, unspecified: Secondary | ICD-10-CM | POA: Diagnosis not present

## 2014-04-09 DIAGNOSIS — R0602 Shortness of breath: Secondary | ICD-10-CM

## 2014-04-09 DIAGNOSIS — G9009 Other idiopathic peripheral autonomic neuropathy: Secondary | ICD-10-CM | POA: Diagnosis not present

## 2014-04-09 DIAGNOSIS — R197 Diarrhea, unspecified: Secondary | ICD-10-CM | POA: Diagnosis not present

## 2014-04-09 DIAGNOSIS — J189 Pneumonia, unspecified organism: Secondary | ICD-10-CM | POA: Diagnosis not present

## 2014-04-09 DIAGNOSIS — IMO0001 Reserved for inherently not codable concepts without codable children: Secondary | ICD-10-CM | POA: Insufficient documentation

## 2014-04-09 DIAGNOSIS — R109 Unspecified abdominal pain: Secondary | ICD-10-CM | POA: Diagnosis not present

## 2014-04-09 DIAGNOSIS — R072 Precordial pain: Secondary | ICD-10-CM | POA: Diagnosis not present

## 2014-04-09 DIAGNOSIS — J302 Other seasonal allergic rhinitis: Secondary | ICD-10-CM | POA: Diagnosis present

## 2014-04-09 DIAGNOSIS — K921 Melena: Secondary | ICD-10-CM | POA: Diagnosis not present

## 2014-04-09 DIAGNOSIS — R140 Abdominal distension (gaseous): Secondary | ICD-10-CM

## 2014-04-09 DIAGNOSIS — E559 Vitamin D deficiency, unspecified: Secondary | ICD-10-CM | POA: Diagnosis present

## 2014-04-09 DIAGNOSIS — R569 Unspecified convulsions: Secondary | ICD-10-CM | POA: Diagnosis not present

## 2014-04-09 DIAGNOSIS — M6281 Muscle weakness (generalized): Secondary | ICD-10-CM | POA: Diagnosis not present

## 2014-04-09 DIAGNOSIS — K922 Gastrointestinal hemorrhage, unspecified: Secondary | ICD-10-CM | POA: Diagnosis not present

## 2014-04-09 DIAGNOSIS — K5900 Constipation, unspecified: Secondary | ICD-10-CM | POA: Diagnosis not present

## 2014-04-09 DIAGNOSIS — J159 Unspecified bacterial pneumonia: Secondary | ICD-10-CM

## 2014-04-09 DIAGNOSIS — Z87891 Personal history of nicotine dependence: Secondary | ICD-10-CM | POA: Diagnosis not present

## 2014-04-09 DIAGNOSIS — Z90710 Acquired absence of both cervix and uterus: Secondary | ICD-10-CM

## 2014-04-09 DIAGNOSIS — F329 Major depressive disorder, single episode, unspecified: Secondary | ICD-10-CM | POA: Diagnosis present

## 2014-04-09 DIAGNOSIS — I4519 Other right bundle-branch block: Secondary | ICD-10-CM | POA: Diagnosis not present

## 2014-04-09 DIAGNOSIS — M8008XA Age-related osteoporosis with current pathological fracture, vertebra(e), initial encounter for fracture: Secondary | ICD-10-CM | POA: Diagnosis present

## 2014-04-09 DIAGNOSIS — J984 Other disorders of lung: Secondary | ICD-10-CM | POA: Diagnosis not present

## 2014-04-09 LAB — D-DIMER, QUANTITATIVE: D-Dimer, Quant: 1.24 ug/mL-FEU — ABNORMAL HIGH (ref 0.00–0.48)

## 2014-04-09 LAB — COMPREHENSIVE METABOLIC PANEL
ALBUMIN: 3.7 g/dL (ref 3.5–5.2)
ALK PHOS: 88 U/L (ref 39–117)
ALT: 20 U/L (ref 0–35)
AST: 22 U/L (ref 0–37)
Anion gap: 10 (ref 5–15)
BILIRUBIN TOTAL: 0.6 mg/dL (ref 0.3–1.2)
BUN: 17 mg/dL (ref 6–23)
CHLORIDE: 106 mmol/L (ref 96–112)
CO2: 22 mmol/L (ref 19–32)
Calcium: 8.2 mg/dL — ABNORMAL LOW (ref 8.4–10.5)
Creatinine, Ser: 0.86 mg/dL (ref 0.50–1.10)
GFR calc Af Amer: 74 mL/min — ABNORMAL LOW (ref >90)
GFR, EST NON AFRICAN AMERICAN: 64 mL/min — AB (ref >90)
Glucose, Bld: 109 mg/dL — ABNORMAL HIGH (ref 70–99)
Potassium: 4 mmol/L (ref 3.5–5.1)
SODIUM: 138 mmol/L (ref 135–145)
Total Protein: 7.1 g/dL (ref 6.0–8.3)

## 2014-04-09 LAB — CBC WITH DIFFERENTIAL/PLATELET
Basophils Absolute: 0 10*3/uL (ref 0.0–0.1)
Basophils Relative: 0 % (ref 0–1)
EOS ABS: 0.1 10*3/uL (ref 0.0–0.7)
Eosinophils Relative: 0 % (ref 0–5)
HEMATOCRIT: 28.8 % — AB (ref 36.0–46.0)
Hemoglobin: 7.9 g/dL — ABNORMAL LOW (ref 12.0–15.0)
LYMPHS PCT: 16 % (ref 12–46)
Lymphs Abs: 2.6 10*3/uL (ref 0.7–4.0)
MCH: 21.5 pg — ABNORMAL LOW (ref 26.0–34.0)
MCHC: 27.4 g/dL — ABNORMAL LOW (ref 30.0–36.0)
MCV: 78.3 fL (ref 78.0–100.0)
Monocytes Absolute: 1.8 10*3/uL — ABNORMAL HIGH (ref 0.1–1.0)
Monocytes Relative: 11 % (ref 3–12)
NEUTROS ABS: 11.3 10*3/uL — AB (ref 1.7–7.7)
Neutrophils Relative %: 73 % (ref 43–77)
Platelets: 328 10*3/uL (ref 150–400)
RBC: 3.68 MIL/uL — AB (ref 3.87–5.11)
RDW: 17.3 % — AB (ref 11.5–15.5)
WBC: 15.7 10*3/uL — AB (ref 4.0–10.5)

## 2014-04-09 LAB — LIPASE, BLOOD: Lipase: 25 U/L (ref 11–59)

## 2014-04-09 LAB — TROPONIN I: Troponin I: <0.03 ng/mL (ref <0.031)

## 2014-04-09 LAB — BRAIN NATRIURETIC PEPTIDE: B NATRIURETIC PEPTIDE 5: 187.3 pg/mL — AB (ref 0.0–100.0)

## 2014-04-09 MED ORDER — IOHEXOL 350 MG/ML SOLN
100.0000 mL | Freq: Once | INTRAVENOUS | Status: AC | PRN
  Administered 2014-04-09: 100 mL via INTRAVENOUS

## 2014-04-09 MED ORDER — FENTANYL CITRATE 0.05 MG/ML IJ SOLN
50.0000 ug | Freq: Once | INTRAMUSCULAR | Status: AC
  Administered 2014-04-09: 50 ug via INTRAVENOUS
  Filled 2014-04-09: qty 2

## 2014-04-09 NOTE — Progress Notes (Signed)
CSW met with patient bedside. According to pt she was sent here by Urgent Care. Family was present. Patient confirms that she presents to [facility] due to SOB and generalized body aches. Patient states she has a history of Fibromyalgia and has body aches often. Patient also informed CSW that she has a history of cancer.  Patient state that she lives at home in [place], and that her son is with her 7 days a week. Also, she states that her daughter lives only 3 miles away.  Pt states that she cannot walk without using her walker or cane.  Pt stated that she is cold. CSW brought pt a warm blanket.  Christ Rouse/Daughter [phone] Marlys Stegmaier         [phone]  Willette Brace [phone] ED CSW 04/09/2014 10:22 PM

## 2014-04-09 NOTE — Progress Notes (Signed)
  CARE MANAGEMENT ED NOTE 04/09/2014  Patient:  [name], [name]   Account Number:  [id number]  Date Initiated:  04/09/2014  Documentation initiated by:  Livia Snellen  Subjective/Objective Assessment:   Patient presents to Ed with shortness of breath and body aches     Subjective/Objective Assessment Detail:   Patient with pmhx of breast cancer i nremission for three years.     Action/Plan:   Action/Plan Detail:   Anticipated DC Date:       Status Recommendation to Physician:   Result of Recommendation:    Other ED Services  Consult [facility] [place]  Other    Choice offered to / List presented to:            Status of service:  Completed, signed off  ED Comments:   ED Comments Detail:  [facility] consulted by EDSW to see patient regarding "someone at home to help her clean."  EDCM spoke to patient and her family at bedside.  Patient confirms she lives at home with her son, who is with the patient every day.  Patient reports she does not wear oxygen at home.  Patient confirms she has a walker, cane, BSC, wheelchair ans shower chair at home.  Patient does not have a nebulizer machine.  Patient reports she is usually able to perform her ADL's at home on her own..  Patient reports her son cooks for her.  Patient reports she was just released by [facility] home health services.  EDCM provided patient with list of private duty home health agencies and informed patient it would be an out of pocket expense.  Patient then stated, "Okay, we'll manage."  No further EDCM needs at this time.

## 2014-04-09 NOTE — ED Provider Notes (Signed)
CSN: [id number]     Arrival date & time 04/09/14  2048 History   First MD Initiated Contact with Patient 04/09/14 2102     Chief Complaint  Patient presents with  . Shortness of Breath     (Consider location/radiation/quality/duration/timing/severity/associated sxs/prior Treatment) The history is provided by the patient. No language interpreter was used.  [name] is a 80 y/o F with PMHx of breast cancer in remission x 3 years, IBS, Fibromyalgia, PE, DVT bilaterally, osetoporosis, HLD, CHF with ejection fraction of 55-60% as per February 2015 presenting to the ED with shortness of breath that has been ongoing for 2 weeks with worsening today. Patient reported that she is out of breath at rest. Stated that she has been experiencing chest pain localized to the center of her chest described as an intense sharp pain with radiation to her abdomen and back. Reported that the pain is also in her epigastric region described as a sharp pain. Stated that she used gabapentin and cymbalta. Stated that she has been having intermittent chills, nausea, and vomiting today - NB/NB. Reported that she has history of bilateral DVT - left worse than the right. Stated that she was on warfarin, but stated that the warfarin was discontinued approximately 2 months ago where she was started on ASA 81 mg daily. Denied fever, diaphoresis, hemoptysis, hematemesis, melena, hematochezia, fainting, numbness, tingling, travels. PCP Dr. Forde Dandy  Past Medical History  Diagnosis Date  . IBS (irritable bowel syndrome)   . Fibromyalgia   . History of DVT (deep vein thrombosis)   . History of pulmonary embolism   . Osteoporosis   . History of breast cancer LEFT BREAST DCIS  1996  . Hyperlipidemia   . 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 Breast cancer RIGHT SIDE-- DX OCT 2011    CHEMORADIATION THERAPY-- COMPLETED   . PONV (postoperative nausea and vomiting)   . Skin tear LEFT ARM  COVERED W/ TEGADERM    PT STATES HX MULTIPLE SKIN TEARS -- THIN  . Thin skin SECONARDY AGE AND HX CHEMO  . Ecchymosis   . Seasonal allergies   . Frequency of urination   . Nocturia   . Anemia   . Lung mass PER DR CLANCE NOTE UNCLEAR IF LYMPHOMA FROM BX    FOLLOWED BY DR RUBIN  . Seizures   . Unspecified hereditary and idiopathic peripheral neuropathy   . Asthma   . Fibromyalgia   . Vertebral compression fracture 5/14    L4  . CHF (congestive heart failure)    Past Surgical History  Procedure Laterality Date  . Total abdominal hysterectomy w/ bilateral salpingoophorectomy  1977  (APPROX)  . Cholecystectomy    . Appendectomy    . Femur fracture surgery  12-18-2010  DR BEANE    INTRAMEDULLARY NAILING LEFT INTERTROCHANTERIC -SUBTROCHANTERIC FX  . Right breast lumpectomy / removal lymph nodes x2/ removal pac  10-01-2010    CARCINOMA RIGHT BREAST  . Placement port- a- cath  02-18-2010  . Bronchoscopy  01-29-2010    W/ BX  . Tvt vaginal tape  suburethral sling   06-08-2005    SUI  . Bilateral laminectomy/ foraminotomy  01-14-2004    L4 - L5  . Breast lumpectomy  1996     LEFT BREAST DCIS   . Cataract extraction w/ intraocular lens  implant, bilateral    . Bilateral carpal tunnel release    . Tonsillectomy    . Transthoracic  echocardiogram  02-25-2010    LVSF NORMAL/ EF 29-52%/ GRADE I DIASTOLIC DYSFUNCTION/ MILDLY DILATED LEFT ATRIUM  . Hardware removal  10/11/2011    Procedure: HARDWARE REMOVAL;  Surgeon: Johnn Hai, MD;  Location: [facility];  Service: Orthopedics;  Laterality: Left;  LEFT THIGH REMOVAL OF DISTAL LOCKING SCREW NEEDS: FLORO, RADIO LUSCENT TABLE AND SCREWDRIVER FOR BIOMET ROD   . Colonoscopy    . Esophagogastroduodenoscopy N/A 03/11/2013    Procedure: ESOPHAGOGASTRODUODENOSCOPY (EGD);  Surgeon: Lear Ng, MD;  Location: [facility];  Service: Endoscopy;  Laterality: N/A;   Family History  Problem Relation Age of Onset  .  Emphysema Father   . CAD Father   . Ovarian cancer Mother   . Cancer Mother     cervical  . Alzheimer's disease Mother   . Hyperlipidemia Mother   . Breast cancer Maternal Grandmother   . Cancer Sister     cervical   History  Substance Use Topics  . Smoking status: Former Smoker -- 1.00 packs/day for 30 years    Types: Cigarettes    Quit date: 03/01/1978  . Smokeless tobacco: Never Used  . Alcohol Use: 4.2 - 6.0 oz/week    7-10 Glasses of wine per week   OB History    No data available     Review of Systems  Constitutional: Negative for fever and chills.  Eyes: Negative for visual disturbance.  Respiratory: Positive for shortness of breath. Negative for chest tightness.   Cardiovascular: Positive for chest pain.  Gastrointestinal: Positive for nausea, vomiting and abdominal pain. Negative for diarrhea, constipation, blood in stool and anal bleeding.  Musculoskeletal: Negative for back pain and neck pain.  Neurological: Negative for dizziness, weakness and numbness.      Allergies  Penicillins; Adhesive; Doxycycline; and Morphine and related  Home Medications   Prior to Admission medications   Medication Sig Start Date End Date Taking? Authorizing Provider  aspirin EC 81 MG tablet Take 81 mg by mouth daily.   Yes Historical Provider, MD  dexlansoprazole (DEXILANT) 60 MG capsule Take 60 mg by mouth daily.    Yes Historical Provider, MD  DULoxetine (CYMBALTA) 60 MG capsule Take 60 mg by mouth daily after lunch.    Yes Historical Provider, MD  ergocalciferol (VITAMIN D2) 50000 UNITS capsule Take 50,000 Units by mouth once a week. Every Tuesday.   Yes Historical Provider, MD  ezetimibe-simvastatin (VYTORIN) 10-80 MG per tablet Take 1 tablet by mouth at bedtime. For hyperlipidemia   Yes Historical Provider, MD  gabapentin (NEURONTIN) 100 MG capsule Take 100 mg by mouth daily.    Yes Historical Provider, MD  hyoscyamine (LEVBID) 0.375 MG 12 hr tablet Take 1 tablet by mouth  daily. For bowel spasms. 07/10/12  Yes Historical Provider, MD  levETIRAcetam (KEPPRA) 500 MG tablet TAKE 1 TABLET TWICE A DAY 03/15/14  Yes Marcial Pacas, MD  levothyroxine (SYNTHROID, LEVOTHROID) 88 MCG tablet Take 88 mcg by mouth daily before breakfast. For hypothyroid   Yes Historical Provider, MD  LORazepam (ATIVAN) 0.5 MG tablet Take 0.5 mg by mouth daily. Anxiety 07/04/12  Yes Vivien Rota, NP  potassium chloride SA (K-DUR,KLOR-CON) 20 MEQ tablet Take 20 mEq by mouth daily. 04/17/13  Yes Sheela Stack, MD  predniSONE (DELTASONE) 5 MG tablet Take 5 mg by mouth daily.   Yes Historical Provider, MD  UCERIS 9 MG TB24 Take 3 capsules by mouth daily. 07/24/13  Yes Historical Provider, MD   BP 137/86  mmHg  Pulse 113  Temp(Src) 98.9 F (37.2 C) (Oral)  Resp 16  Ht 5\' 6"  (1.676 m)  Wt 164 lb (74.39 kg)  BMI 26.48 kg/m2  SpO2 94% Physical Exam  Constitutional: She is oriented to person, place, and time. She appears well-developed and well-nourished. No distress.  HENT:  Head: Normocephalic and atraumatic.  Dry mucus membranes  Eyes: Conjunctivae and EOM are normal. Pupils are equal, round, and reactive to light. Right eye exhibits no discharge. Left eye exhibits no discharge.  Neck: Normal range of motion. Neck supple. No tracheal deviation present.  Cardiovascular: Normal rate, regular rhythm and normal heart sounds.  Exam reveals no friction rub.   No murmur heard. Pulses:      Radial pulses are 2+ on the right side, and 2+ on the left side.       Dorsalis pedis pulses are 2+ on the right side, and 2+ on the left side.  Bilateral leg swelling, left more so than the right. Tenderness upon palpation to the calves bilaterally   Pulmonary/Chest: Effort normal and breath sounds normal. No respiratory distress. She has no wheezes. She has no rales. She exhibits tenderness.  Discomfort upon palpation to the chest - pleuritic  Abdominal: Soft. Bowel sounds are normal. She exhibits no  distension. There is tenderness in the epigastric area. There is no rebound and no guarding.  Genitourinary:  Rectal exam: Negative swelling, erythema, inflammation, lesions, sores, hemorrhoids noted. Negative bright red blood per rectum. Sphincter tone baseline. Brown stools noted on glove. Negative blood noted on glove. Exam chaperoned with nurse, Vernie Shanks.  Musculoskeletal: Normal range of motion.  Lymphadenopathy:    She has no cervical adenopathy.  Neurological: She is alert and oriented to person, place, and time. No cranial nerve deficit. She exhibits normal muscle tone. Coordination normal.  Skin: Skin is warm and dry. No rash noted. She is not diaphoretic. No erythema.  Psychiatric: She has a normal mood and affect. Her behavior is normal. Thought content normal.  Nursing note and vitals reviewed.   ED Course  Procedures (including critical care time)  Results for orders placed or performed during the hospital encounter of 04/09/14  CBC with Differential/Platelet  Result Value Ref Range   WBC 15.7 (H) 4.0 - 10.5 K/uL   RBC 3.68 (L) 3.87 - 5.11 MIL/uL   Hemoglobin 7.9 (L) 12.0 - 15.0 g/dL   HCT 28.8 (L) 36.0 - 46.0 %   MCV 78.3 78.0 - 100.0 fL   MCH 21.5 (L) 26.0 - 34.0 pg   MCHC 27.4 (L) 30.0 - 36.0 g/dL   RDW 17.3 (H) 11.5 - 15.5 %   Platelets 328 150 - 400 K/uL   Neutrophils Relative % 73 43 - 77 %   Neutro Abs 11.3 (H) 1.7 - 7.7 K/uL   Lymphocytes Relative 16 12 - 46 %   Lymphs Abs 2.6 0.7 - 4.0 K/uL   Monocytes Relative 11 3 - 12 %   Monocytes Absolute 1.8 (H) 0.1 - 1.0 K/uL   Eosinophils Relative 0 0 - 5 %   Eosinophils Absolute 0.1 0.0 - 0.7 K/uL   Basophils Relative 0 0 - 1 %   Basophils Absolute 0.0 0.0 - 0.1 K/uL  Comprehensive metabolic panel  Result Value Ref Range   Sodium 138 135 - 145 mmol/L   Potassium 4.0 3.5 - 5.1 mmol/L   Chloride 106 96 - 112 mmol/L   CO2 22 19 - 32 mmol/L   Glucose, Bld  109 (H) 70 - 99 mg/dL   BUN 17 6 - 23 mg/dL   Creatinine,  Ser 0.86 0.50 - 1.10 mg/dL   Calcium 8.2 (L) 8.4 - 10.5 mg/dL   Total Protein 7.1 6.0 - 8.3 g/dL   Albumin 3.7 3.5 - 5.2 g/dL   AST 22 0 - 37 U/L   ALT 20 0 - 35 U/L   Alkaline Phosphatase 88 39 - 117 U/L   Total Bilirubin 0.6 0.3 - 1.2 mg/dL   GFR calc non Af Amer 64 (L) >90 mL/min   GFR calc Af Amer 74 (L) >90 mL/min   Anion gap 10 5 - 15  Troponin I  Result Value Ref Range   Troponin I <0.03 <0.031 ng/mL  D-dimer, quantitative  Result Value Ref Range   D-Dimer, Quant 1.24 (H) 0.00 - 0.48 ug/mL-FEU  Brain natriuretic peptide  Result Value Ref Range   B Natriuretic Peptide 187.3 (H) 0.0 - 100.0 pg/mL  Lipase, blood  Result Value Ref Range   Lipase 25 11 - 59 U/L  POC occult blood, ED  Result Value Ref Range   Fecal Occult Bld NEGATIVE NEGATIVE    Labs Review Labs Reviewed  CBC WITH DIFFERENTIAL/PLATELET - Abnormal; Notable for the following:    WBC 15.7 (*)    RBC 3.68 (*)    Hemoglobin 7.9 (*)    HCT 28.8 (*)    MCH 21.5 (*)    MCHC 27.4 (*)    RDW 17.3 (*)    Neutro Abs 11.3 (*)    Monocytes Absolute 1.8 (*)    All other components within normal limits  COMPREHENSIVE METABOLIC PANEL - Abnormal; Notable for the following:    Glucose, Bld 109 (*)    Calcium 8.2 (*)    GFR calc non Af Amer 64 (*)    GFR calc Af Amer 74 (*)    All other components within normal limits  D-DIMER, QUANTITATIVE - Abnormal; Notable for the following:    D-Dimer, Quant 1.24 (*)    All other components within normal limits  BRAIN NATRIURETIC PEPTIDE - Abnormal; Notable for the following:    B Natriuretic Peptide 187.3 (*)    All other components within normal limits  TROPONIN I  LIPASE, BLOOD  URINALYSIS, ROUTINE W REFLEX MICROSCOPIC  OCCULT BLOOD X 1 CARD TO LAB, STOOL  POC OCCULT BLOOD, ED  TYPE AND SCREEN    Imaging Review Dg Chest 2 View  04/09/2014   CLINICAL DATA:  Shortness of breath since 04/08/2014. Patient bent over today and felt a pop in the chest. Increasing sternal  pain and shortness of breath.  EXAM: CHEST  2 VIEW  COMPARISON:  04/17/2013  FINDINGS: Shallow inspiration with elevated right hemidiaphragm. Heart size and pulmonary vascularity appear normal. No focal consolidation or airspace disease. No blunting of costophrenic angles. Esophageal hiatal hernia behind the heart. Surgical clips in the left breast and axilla. Degenerative changes in the spine with post kyphoplasty densities in the upper lumbar region. Multiple endplate compression deformities consistent with osteoporosis.  IMPRESSION: Shallow inspiration with elevation of right hemidiaphragm. No focal consolidation in the lungs.   Electronically Signed   By: Lucienne Capers M.D.   On: 04/09/2014 21:47   Ct Angio Chest Pe W/cm &/or Wo Cm  04/10/2014   CLINICAL DATA:  Shortness of breath and back pain. Elevated D-dimer. History of bilateral breast cancer.  EXAM: CT ANGIOGRAPHY CHEST WITH CONTRAST  TECHNIQUE: Multidetector CT imaging of the  chest was performed using the standard protocol during bolus administration of intravenous contrast. Multiplanar CT image reconstructions and MIPs were obtained to evaluate the vascular anatomy.  CONTRAST:  155mL OMNIPAQUE IOHEXOL 350 MG/ML SOLN  COMPARISON:  07/31/2010  FINDINGS: Technically adequate study with good opacification of the central and segmental pulmonary arteries. No focal filling defects demonstrated. No evidence of significant pulmonary embolus.  Normal heart size. Normal caliber thoracic aorta. No evidence of aortic dissection. Coronary artery and aortic calcification. Great vessel origins are patent. Esophagus is decompressed. Moderate-sized esophageal hiatal hernia. No significant lymphadenopathy in the chest.  Evaluation of lungs is limited due to respiratory motion artifact. There are diffuse emphysematous changes throughout the lungs with interstitial and alveolar infiltrates most likely to represent edema or diffuse pneumonitis. No pleural effusions. No  pneumothorax.  Included portions of the upper abdomen demonstrate a small low-attenuation lesion in the liver, probably representing a cyst and stable since prior study. Degenerative changes in the spine. Normal No destructive bone lesions. Multiple thoracic vertebral compression fractures, likely indicating osteoporosis. These appear to have been present on a previous chest radiograph from 04/17/2013, but progression is suggested.  Review of the MIP images confirms the above findings.  IMPRESSION: No evidence of significant pulmonary embolus. Diffuse airspace and interstitial disease throughout the lungs suggesting edema or infiltration. Diffuse emphysema. Multiple thoracic vertebral compression fractures demonstrating progression since previous study.   Electronically Signed   By: Lucienne Capers M.D.   On: 04/10/2014 00:12     EKG Interpretation   Date/Time:  Tuesday April 09 2014 21:26:23 EST Ventricular Rate:  92 PR Interval:  146 QRS Duration: 118 QT Interval:  395 QTC Calculation: 489 R Axis:   33 Text Interpretation:  Normal sinus rhythm Atrial premature complex  Incomplete right bundle branch block Baseline wander in lead(s) V5 No  significant change since Feb 2015 Confirmed by Regenia Skeeter  MD, SCOTT ([id number])  on 04/09/2014 9:40:15 PM      10:33 PM Patient seen and assessed by attending physician, Dr. Tyron Russell - recommended patient to get fentanyl for pain, 50 mcg. Recommended CT angiogram for PE rule out.   12:34 AM Spoke with Dr. Mora Bellman, [facility] - discussed case, labs, imaging, vitals, ED course. Patient to be admitted to telemetry floor regarding possible beginnings of community-acquired pneumonia.  MDM   Final diagnoses:  CAP (community acquired pneumonia)  Hypoxia  Shortness of breath  Anemia, unspecified anemia type  History of DVT (deep vein thrombosis)  History of breast cancer    Medications  fentaNYL (SUBLIMAZE) injection 50 mcg (50 mcg Intravenous  Given 04/09/14 2242)  iohexol (OMNIPAQUE) 350 MG/ML injection 100 mL (100 mLs Intravenous Contrast Given 04/09/14 2345)    Filed Vitals:   04/09/14 2229 04/09/14 2230 04/09/14 2236 04/09/14 2312  BP: 152/78 144/72  137/86  Pulse: 130  94 113  Temp: 98.9 F (37.2 C)     TempSrc: Oral     Resp: 17   16  Height:      Weight:      SpO2: 92%  98% 94%    EKG noted normal sinus rhythm with atrial premature complexes with incomplete right bundle branch block, heart rate 92 bpm-no stomach and changed since February 2015. Troponin negative elevation. D-dimer 1.24. BNP mildly elevated at 187.3. CBC noted elevated white blood cell count of 15.7. Hemoglobin 7.9, hematocrit 28.8. Hgb has decreased when compared to 8 months ago - 8 months ago Hgb was 12.6. CMP  unremarkable-glucose 109, anion gap 10.0 mEq per liter. Lipase negative elevation. Fecal occult negative. Type and screen pending. Chest xray noted shallow inspiration with elevation of the right hemidiaphragm. CT angiogram of chest no evidence of significant pulmonary embolism. Diffuse airspace and interstitial disease throughout the lungs suggesting edema or infiltration. Multiple thoracic vertebral compression fractures demonstrate progression since previous study. Patient presenting to the ED with shortness of breath that has been ongoing for approximately 2 weeks with worsening over the past 2 days. D-dimer elevated with negative findings of PE on CT angiogram. Suspicion for possible infiltration, pneumonia noted on CT-patient does have history of cough and increased shortness of breath, elevated white blood cell count noted 15.7. Patient also appears to be anemic with a hemoglobin of 7.9. When compared to 8 months ago - hemoglobin was 12.6. Patient hypoxic with pulse ox of 82% on room air-nasal cannula place with oxygen therapy-patient does not have history of oxygen therapy. Patient to be admitted to the hospital for community-acquired pneumonia. Patient  to be admitted under the care of [facility]. Patient agrees to plan of care. Patient stable for transfer.  Jamse Mead, PA-C 04/10/14 0230  Ephraim Hamburger, MD 04/10/14 [id number]

## 2014-04-09 NOTE — ED Notes (Signed)
Pt states that yesterday she started having SOB and generalized body aches. Pt went to Urgent Care today and they sent her here to rule out a PE.

## 2014-04-09 NOTE — ED Notes (Signed)
Transported to X-ray

## 2014-04-10 DIAGNOSIS — A419 Sepsis, unspecified organism: Secondary | ICD-10-CM | POA: Diagnosis present

## 2014-04-10 DIAGNOSIS — J984 Other disorders of lung: Secondary | ICD-10-CM | POA: Diagnosis not present

## 2014-04-10 DIAGNOSIS — Z87891 Personal history of nicotine dependence: Secondary | ICD-10-CM | POA: Diagnosis not present

## 2014-04-10 DIAGNOSIS — K589 Irritable bowel syndrome without diarrhea: Secondary | ICD-10-CM | POA: Diagnosis not present

## 2014-04-10 DIAGNOSIS — K5289 Other specified noninfective gastroenteritis and colitis: Secondary | ICD-10-CM | POA: Diagnosis not present

## 2014-04-10 DIAGNOSIS — K922 Gastrointestinal hemorrhage, unspecified: Secondary | ICD-10-CM

## 2014-04-10 DIAGNOSIS — K449 Diaphragmatic hernia without obstruction or gangrene: Secondary | ICD-10-CM | POA: Diagnosis not present

## 2014-04-10 DIAGNOSIS — G9009 Other idiopathic peripheral autonomic neuropathy: Secondary | ICD-10-CM | POA: Diagnosis not present

## 2014-04-10 DIAGNOSIS — R197 Diarrhea, unspecified: Secondary | ICD-10-CM | POA: Diagnosis not present

## 2014-04-10 DIAGNOSIS — Z9049 Acquired absence of other specified parts of digestive tract: Secondary | ICD-10-CM | POA: Diagnosis present

## 2014-04-10 DIAGNOSIS — Z86718 Personal history of other venous thrombosis and embolism: Secondary | ICD-10-CM | POA: Diagnosis not present

## 2014-04-10 DIAGNOSIS — I4510 Unspecified right bundle-branch block: Secondary | ICD-10-CM | POA: Diagnosis present

## 2014-04-10 DIAGNOSIS — R7302 Impaired glucose tolerance (oral): Secondary | ICD-10-CM | POA: Diagnosis present

## 2014-04-10 DIAGNOSIS — M6281 Muscle weakness (generalized): Secondary | ICD-10-CM | POA: Diagnosis not present

## 2014-04-10 DIAGNOSIS — R0602 Shortness of breath: Secondary | ICD-10-CM | POA: Diagnosis not present

## 2014-04-10 DIAGNOSIS — E039 Hypothyroidism, unspecified: Secondary | ICD-10-CM

## 2014-04-10 DIAGNOSIS — Z7982 Long term (current) use of aspirin: Secondary | ICD-10-CM | POA: Diagnosis not present

## 2014-04-10 DIAGNOSIS — I509 Heart failure, unspecified: Secondary | ICD-10-CM | POA: Diagnosis not present

## 2014-04-10 DIAGNOSIS — F329 Major depressive disorder, single episode, unspecified: Secondary | ICD-10-CM | POA: Diagnosis present

## 2014-04-10 DIAGNOSIS — G629 Polyneuropathy, unspecified: Secondary | ICD-10-CM | POA: Diagnosis not present

## 2014-04-10 DIAGNOSIS — J439 Emphysema, unspecified: Secondary | ICD-10-CM | POA: Diagnosis not present

## 2014-04-10 DIAGNOSIS — K219 Gastro-esophageal reflux disease without esophagitis: Secondary | ICD-10-CM | POA: Diagnosis not present

## 2014-04-10 DIAGNOSIS — K5900 Constipation, unspecified: Secondary | ICD-10-CM | POA: Diagnosis not present

## 2014-04-10 DIAGNOSIS — C50919 Malignant neoplasm of unspecified site of unspecified female breast: Secondary | ICD-10-CM | POA: Diagnosis not present

## 2014-04-10 DIAGNOSIS — K635 Polyp of colon: Secondary | ICD-10-CM | POA: Diagnosis not present

## 2014-04-10 DIAGNOSIS — G620 Drug-induced polyneuropathy: Secondary | ICD-10-CM | POA: Diagnosis present

## 2014-04-10 DIAGNOSIS — J189 Pneumonia, unspecified organism: Secondary | ICD-10-CM | POA: Diagnosis not present

## 2014-04-10 DIAGNOSIS — I471 Supraventricular tachycardia: Secondary | ICD-10-CM | POA: Diagnosis not present

## 2014-04-10 DIAGNOSIS — R7301 Impaired fasting glucose: Secondary | ICD-10-CM | POA: Diagnosis not present

## 2014-04-10 DIAGNOSIS — R569 Unspecified convulsions: Secondary | ICD-10-CM

## 2014-04-10 DIAGNOSIS — R2681 Unsteadiness on feet: Secondary | ICD-10-CM | POA: Diagnosis not present

## 2014-04-10 DIAGNOSIS — M8008XA Age-related osteoporosis with current pathological fracture, vertebra(e), initial encounter for fracture: Secondary | ICD-10-CM | POA: Diagnosis present

## 2014-04-10 DIAGNOSIS — Z853 Personal history of malignant neoplasm of breast: Secondary | ICD-10-CM | POA: Diagnosis not present

## 2014-04-10 DIAGNOSIS — M810 Age-related osteoporosis without current pathological fracture: Secondary | ICD-10-CM | POA: Diagnosis present

## 2014-04-10 DIAGNOSIS — K921 Melena: Secondary | ICD-10-CM | POA: Diagnosis not present

## 2014-04-10 DIAGNOSIS — E785 Hyperlipidemia, unspecified: Secondary | ICD-10-CM | POA: Diagnosis not present

## 2014-04-10 DIAGNOSIS — E559 Vitamin D deficiency, unspecified: Secondary | ICD-10-CM | POA: Diagnosis present

## 2014-04-10 DIAGNOSIS — Z86711 Personal history of pulmonary embolism: Secondary | ICD-10-CM | POA: Diagnosis not present

## 2014-04-10 DIAGNOSIS — D649 Anemia, unspecified: Secondary | ICD-10-CM | POA: Diagnosis not present

## 2014-04-10 DIAGNOSIS — R072 Precordial pain: Secondary | ICD-10-CM | POA: Diagnosis not present

## 2014-04-10 DIAGNOSIS — F419 Anxiety disorder, unspecified: Secondary | ICD-10-CM | POA: Diagnosis not present

## 2014-04-10 DIAGNOSIS — D509 Iron deficiency anemia, unspecified: Secondary | ICD-10-CM | POA: Diagnosis not present

## 2014-04-10 DIAGNOSIS — G40909 Epilepsy, unspecified, not intractable, without status epilepticus: Secondary | ICD-10-CM | POA: Diagnosis present

## 2014-04-10 DIAGNOSIS — R109 Unspecified abdominal pain: Secondary | ICD-10-CM | POA: Diagnosis not present

## 2014-04-10 DIAGNOSIS — Z90710 Acquired absence of both cervix and uterus: Secondary | ICD-10-CM | POA: Diagnosis not present

## 2014-04-10 DIAGNOSIS — Z6826 Body mass index (BMI) 26.0-26.9, adult: Secondary | ICD-10-CM | POA: Diagnosis not present

## 2014-04-10 DIAGNOSIS — I4519 Other right bundle-branch block: Secondary | ICD-10-CM | POA: Diagnosis not present

## 2014-04-10 DIAGNOSIS — D124 Benign neoplasm of descending colon: Secondary | ICD-10-CM | POA: Diagnosis not present

## 2014-04-10 DIAGNOSIS — IMO0001 Reserved for inherently not codable concepts without codable children: Secondary | ICD-10-CM | POA: Insufficient documentation

## 2014-04-10 DIAGNOSIS — E2749 Other adrenocortical insufficiency: Secondary | ICD-10-CM | POA: Diagnosis not present

## 2014-04-10 DIAGNOSIS — Z8249 Family history of ischemic heart disease and other diseases of the circulatory system: Secondary | ICD-10-CM | POA: Diagnosis not present

## 2014-04-10 DIAGNOSIS — T451X5A Adverse effect of antineoplastic and immunosuppressive drugs, initial encounter: Secondary | ICD-10-CM | POA: Diagnosis present

## 2014-04-10 DIAGNOSIS — K2971 Gastritis, unspecified, with bleeding: Secondary | ICD-10-CM | POA: Diagnosis not present

## 2014-04-10 DIAGNOSIS — G4733 Obstructive sleep apnea (adult) (pediatric): Secondary | ICD-10-CM | POA: Diagnosis not present

## 2014-04-10 DIAGNOSIS — J90 Pleural effusion, not elsewhere classified: Secondary | ICD-10-CM | POA: Diagnosis not present

## 2014-04-10 DIAGNOSIS — M8080XS Other osteoporosis with current pathological fracture, unspecified site, sequela: Secondary | ICD-10-CM | POA: Diagnosis not present

## 2014-04-10 DIAGNOSIS — G8929 Other chronic pain: Secondary | ICD-10-CM | POA: Diagnosis present

## 2014-04-10 DIAGNOSIS — E440 Moderate protein-calorie malnutrition: Secondary | ICD-10-CM | POA: Diagnosis not present

## 2014-04-10 DIAGNOSIS — Z803 Family history of malignant neoplasm of breast: Secondary | ICD-10-CM | POA: Diagnosis not present

## 2014-04-10 DIAGNOSIS — Z8041 Family history of malignant neoplasm of ovary: Secondary | ICD-10-CM | POA: Diagnosis not present

## 2014-04-10 LAB — URINALYSIS, ROUTINE W REFLEX MICROSCOPIC
Bilirubin Urine: NEGATIVE
GLUCOSE, UA: NEGATIVE mg/dL
Ketones, ur: NEGATIVE mg/dL
Nitrite: POSITIVE — AB
PH: 6 (ref 5.0–8.0)
PROTEIN: NEGATIVE mg/dL
SPECIFIC GRAVITY, URINE: 1.021 (ref 1.005–1.030)
Urobilinogen, UA: 0.2 mg/dL (ref 0.0–1.0)

## 2014-04-10 LAB — RETICULOCYTES
RBC.: 3.38 MIL/uL — ABNORMAL LOW (ref 3.87–5.11)
RETIC COUNT ABSOLUTE: 94.6 10*3/uL (ref 19.0–186.0)
Retic Ct Pct: 2.8 % (ref 0.4–3.1)

## 2014-04-10 LAB — FERRITIN: Ferritin: 12 ng/mL (ref 10–291)

## 2014-04-10 LAB — IRON AND TIBC
Iron: <10 ug/dL — ABNORMAL LOW (ref 42–145)
UIBC: 401 ug/dL — ABNORMAL HIGH (ref 125–400)

## 2014-04-10 LAB — URINE MICROSCOPIC-ADD ON

## 2014-04-10 LAB — FOLATE: Folate: >20 ng/mL

## 2014-04-10 LAB — POC OCCULT BLOOD, ED: Fecal Occult Bld: NEGATIVE

## 2014-04-10 LAB — VITAMIN B12: VITAMIN B 12: 218 pg/mL (ref 211–911)

## 2014-04-10 MED ORDER — LEVOTHYROXINE SODIUM 88 MCG PO TABS
88.0000 ug | ORAL_TABLET | Freq: Every day | ORAL | Status: DC
  Administered 2014-04-10 – 2014-04-17 (×8): 88 ug via ORAL
  Filled 2014-04-10 (×11): qty 1

## 2014-04-10 MED ORDER — FENTANYL CITRATE 0.05 MG/ML IJ SOLN
50.0000 ug | INTRAMUSCULAR | Status: DC | PRN
  Administered 2014-04-10 – 2014-04-11 (×2): 50 ug via INTRAVENOUS
  Filled 2014-04-10 (×2): qty 2

## 2014-04-10 MED ORDER — OXYCODONE-ACETAMINOPHEN 5-325 MG PO TABS
1.0000 | ORAL_TABLET | Freq: Four times a day (QID) | ORAL | Status: DC | PRN
  Administered 2014-04-10 – 2014-04-18 (×23): 1 via ORAL
  Filled 2014-04-10 (×23): qty 1

## 2014-04-10 MED ORDER — PANTOPRAZOLE SODIUM 40 MG PO TBEC
40.0000 mg | DELAYED_RELEASE_TABLET | Freq: Every day | ORAL | Status: DC
  Administered 2014-04-10 – 2014-04-18 (×9): 40 mg via ORAL
  Filled 2014-04-10 (×9): qty 1

## 2014-04-10 MED ORDER — LORAZEPAM 0.5 MG PO TABS
0.5000 mg | ORAL_TABLET | Freq: Every day | ORAL | Status: DC | PRN
  Administered 2014-04-14 – 2014-04-16 (×2): 0.5 mg via ORAL
  Filled 2014-04-10: qty 1

## 2014-04-10 MED ORDER — MORPHINE SULFATE 2 MG/ML IJ SOLN
1.0000 mg | INTRAMUSCULAR | Status: DC | PRN
  Administered 2014-04-10: 1 mg via INTRAVENOUS

## 2014-04-10 MED ORDER — ASPIRIN EC 81 MG PO TBEC
81.0000 mg | DELAYED_RELEASE_TABLET | Freq: Every day | ORAL | Status: DC
  Administered 2014-04-10 – 2014-04-18 (×9): 81 mg via ORAL
  Filled 2014-04-10 (×10): qty 1

## 2014-04-10 MED ORDER — LEVETIRACETAM 500 MG PO TABS
500.0000 mg | ORAL_TABLET | Freq: Two times a day (BID) | ORAL | Status: DC
  Administered 2014-04-10 – 2014-04-18 (×17): 500 mg via ORAL
  Filled 2014-04-10 (×19): qty 1

## 2014-04-10 MED ORDER — INFLUENZA VAC SPLIT QUAD 0.5 ML IM SUSY
0.5000 mL | PREFILLED_SYRINGE | INTRAMUSCULAR | Status: DC
  Filled 2014-04-10 (×3): qty 0.5

## 2014-04-10 MED ORDER — FERROUS SULFATE 325 (65 FE) MG PO TABS
325.0000 mg | ORAL_TABLET | Freq: Three times a day (TID) | ORAL | Status: DC
  Administered 2014-04-10 – 2014-04-11 (×2): 325 mg via ORAL
  Filled 2014-04-10 (×2): qty 1

## 2014-04-10 MED ORDER — HEPARIN SODIUM (PORCINE) 5000 UNIT/ML IJ SOLN
5000.0000 [IU] | Freq: Three times a day (TID) | INTRAMUSCULAR | Status: DC
  Administered 2014-04-10 – 2014-04-18 (×22): 5000 [IU] via SUBCUTANEOUS
  Filled 2014-04-10 (×26): qty 1

## 2014-04-10 MED ORDER — SODIUM CHLORIDE 0.9 % IV BOLUS (SEPSIS)
500.0000 mL | Freq: Once | INTRAVENOUS | Status: AC
Start: 2014-04-10 — End: 2014-04-10
  Administered 2014-04-10: 500 mL via INTRAVENOUS

## 2014-04-10 MED ORDER — HYOSCYAMINE SULFATE ER 0.375 MG PO TB12
0.3750 mg | ORAL_TABLET | Freq: Every day | ORAL | Status: DC
  Administered 2014-04-10 – 2014-04-18 (×7): 0.375 mg via ORAL
  Filled 2014-04-10 (×12): qty 1

## 2014-04-10 MED ORDER — SODIUM CHLORIDE 0.9 % IV SOLN
INTRAVENOUS | Status: DC
  Administered 2014-04-10 – 2014-04-13 (×5): via INTRAVENOUS

## 2014-04-10 MED ORDER — LEVOFLOXACIN IN D5W 500 MG/100ML IV SOLN
500.0000 mg | Freq: Every day | INTRAVENOUS | Status: DC
  Administered 2014-04-10 – 2014-04-13 (×5): 500 mg via INTRAVENOUS
  Filled 2014-04-10 (×5): qty 100

## 2014-04-10 NOTE — Progress Notes (Addendum)
Patient seen and examined this morning, just admitted by Dr. Blaine Hamper, reviewed H&P and examined patient this morning, agree with the plan. She was admitted on the hospitalist service, she is a patient of  [facility], I discussed with Dr. Baldwin Crown office today, they will take over 2/11 am.   She is doing well, complains of generalized pain that she's been having for the past few years, she's been having a productive cough with green sputum, was recently diagnosed with pneumonia several weeks ago status post antibiotics. While in the emergency room she underwent the CTPA which showed possible pneumonia.  Sepsis due to Community-acquired pneumonia - met criteria with fever, elevated white count, tachycardia - Continue levofloxacin  Anemia  - She has a History of GI bleed, however FOBT was negative - ? Related to sepsis - Recheck CBC in the morning - she is iron deficient, will start supplements  Seizure disorder - stable, she is on Keppra  Hypothyroidism -continue her Synthroid  History of DVT/PE - not a candidate for anticoagulation due to GI bleed  IBS - stable    Costin M. Cruzita Lederer, MD [facility] [phone]

## 2014-04-10 NOTE — Evaluation (Signed)
Physical Therapy Evaluation Patient Details Name: AZALIAH CARRERO MRN: [id number] DOB: 1934/03/14 Today's Date: 04/10/2014   History of Present Illness  80 y.o. female with past medical history of DVT, history of pulmonary embolism on aspirin (patient is not a good candidate for anticoagulation due to history of GI bleeding), congestive heart failure, hyperlipidemia, history of breast cancer (s/p surgery, chemotherapy and radiation therapy), history of seizure, asthma and admitted 04/09/14 with SOB.  Clinical Impression  Pt admitted with above diagnosis. Pt currently with functional limitations due to the deficits listed below (see PT Problem List).  Pt will benefit from skilled PT to increase their independence and safety with mobility to allow discharge to the venue listed below.  Pt unsteady with gait at this time. Pt reports her son lives with her and will be able to provide assist.  Discussed using RW and having son with her during mobility initially for safety upon d/c.  Pt with SpO2 of 92% room air at rest however dropped to 85% on room air upon standing so 2L O2 [place] applied for ambulation however oximetry then stopped reading.     Follow Up Recommendations Home health PT;Supervision for mobility/OOB    Equipment Recommendations  None recommended by PT    Recommendations for Other Services       Precautions / Restrictions Precautions Precautions: Fall Precaution Comments: monitor sats      Mobility  Bed Mobility Overal bed mobility: Needs Assistance Bed Mobility: Supine to Sit     Supine to sit: Min guard     General bed mobility comments: increased time and effort however no physical assist required  Transfers Overall transfer level: Needs assistance Equipment used: Rolling walker (2 wheeled) Transfers: Sit to/from Stand Sit to Stand: Min assist         General transfer comment: verbal cue for safety, assist to rise and steady  Ambulation/Gait Ambulation/Gait  assistance: Min assist Ambulation Distance (Feet): 120 Feet Assistive device: Rolling walker (2 wheeled) Gait Pattern/deviations: Step-through pattern;Narrow base of support;Scissoring;Decreased stride length Gait velocity: decr   General Gait Details: very unsteady gait requiring assist even while using RW, SpO2 not reading however applied 2L O2 [place] for ambulation as pt's SpO2 dropped to 85% on room air upon standing  Stairs            Wheelchair Mobility    Modified Rankin (Stroke Patients Only)       Balance                                             Pertinent Vitals/Pain Pain Assessment: 0-10 Pain Score: 7  Pain Location: "all over" Pain Descriptors / Indicators: Other (Comment) (described as tingling, insects crawling ) Pain Intervention(s): Monitored during session;Repositioned    Home Living Family/patient expects to be discharged to:: Private residence Living Arrangements: Children (son lives with her) Available Help at Discharge: Family;Available 24 hours/day Type of Home: House Home Access: Stairs to enter Entrance Stairs-Rails: None Entrance Stairs-Number of Steps: 2 Home Layout: One level Home Equipment: Walker - 4 wheels      Prior Function Level of Independence: Independent with assistive device(s)         Comments: uses 4 wheeled walker typically     Hand Dominance        Extremity/Trunk Assessment  Lower Extremity Assessment: Generalized weakness         Communication   Communication: No difficulties  Cognition Arousal/Alertness: Awake/alert Behavior During Therapy: WFL for tasks assessed/performed Overall Cognitive Status: Within Functional Limits for tasks assessed                      General Comments      Exercises        Assessment/Plan    PT Assessment Patient needs continued PT services  PT Diagnosis Difficulty walking   PT Problem List Decreased  strength;Decreased balance;Decreased mobility;Cardiopulmonary status limiting activity;Decreased knowledge of use of DME  PT Treatment Interventions DME instruction;Gait training;Functional mobility training;Patient/family education;Therapeutic activities;Therapeutic exercise;Balance training   PT Goals (Current goals can be found in the Care Plan section) Acute Rehab PT Goals PT Goal Formulation: With patient Time For Goal Achievement: 04/17/14 Potential to Achieve Goals: Good    Frequency Min 3X/week   Barriers to discharge        Co-evaluation               End of Session Equipment Utilized During Treatment: Gait belt;Oxygen Activity Tolerance: Patient limited by fatigue Patient left: in bed;with call bell/phone within reach;with bed alarm set           Time: 1314-3888 PT Time Calculation (min) (ACUTE ONLY): 29 min   Charges:   PT Evaluation $Initial PT Evaluation Tier I: 1 Procedure PT Treatments $Gait Training: 8-22 mins   PT G Codes:        Ericah Scotto,KATHrine E 04/10/2014, 3:40 PM Carmelia Bake, PT, DPT 04/10/2014 Pager: [phone]

## 2014-04-10 NOTE — Plan of Care (Signed)
Problem: Phase I Progression Outcomes Goal: Pain controlled with appropriate interventions Outcome: Not Progressing Pt prefers not to use Morphine d/t hallucinations. States she has a high pain tolerance and medications do not help.  Goal: Hemodynamically stable Outcome: Not Met (add Reason) Hemoglobin- 7.9

## 2014-04-10 NOTE — H&P (Signed)
[facility] History and Physical  [name] JKK:[id number] DOB: October 07, 1934 DOA: 04/09/2014  Referring physician: ED physician PCP: Sheela Stack, MD  Specialists:   Chief Complaint: SOB, chest pain, productive cough  HPI: [name] is a 80 y.o. female with past medical history of DVT, history of pulmonary embolism on aspirin (patient is not a good candidate for anticoagulation due to history of GI bleeding), congestive heart failure, hyperlipidemia, history of breast cancer (post status of surgery, chemotherapy and radiation therapy), history of seizure, asthma, who presents with productive cough, chest pain or shortness of breath.  Patient reports that her chest pain started yesterday after she stretched out to reach a printing paper at home. The chest pain is constant, 5 out of 10 in severity. She also has cough with green colored sputum production. Reports that her coughing has been going on for several weeks. She also has shortness of breath. She does not have fever or chills. Patient has chronic back pain and chronic diarrhea due to IBS. She does not have symptoms for UTI. No hematochezia or hematuria. She also had mild epigastric pain and nausea. she has chronic back pain. No unilateral weakness, numbness or tingling sensations. No vision change or hearing loss.  In ED, patient was found to have leukocytosis with WBC 15.7. CT angiogram is negative for pulmonary embolism, but showed possible pneumonia. Temperature normal. Tachycardia. BNP 187. Troponin negative. Urinalysis showed large amount of leukocytes, negative for nitrite.  Hemoglobin dropped from 12.9 on 08/01/13 to 7.9. FOBT negative.Patient is admitted to inpatient for further evaluation and treatment.  Review of Systems: As presented in the history of presenting illness, rest negative.  Where does patient live?  At home Can patient participate in ADLs? little  Allergy:  Allergies  Allergen Reactions  .  Penicillins Anaphylaxis  . Adhesive [Tape] Other (See Comments)    Blisters   . Doxycycline Nausea And Vomiting  . Morphine And Related Other (See Comments)    HALLUCINATIONS    Past Medical History  Diagnosis Date  . IBS (irritable bowel syndrome)   . Fibromyalgia   . History of DVT (deep vein thrombosis)   . History of pulmonary embolism   . Osteoporosis   . History of breast cancer LEFT BREAST DCIS  1996  . Hyperlipidemia   . 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 Breast cancer RIGHT SIDE-- DX OCT 2011    CHEMORADIATION THERAPY-- COMPLETED   . PONV (postoperative nausea and vomiting)   . Skin tear LEFT ARM COVERED W/ TEGADERM    PT STATES HX MULTIPLE SKIN TEARS -- THIN  . Thin skin SECONARDY AGE AND HX CHEMO  . Ecchymosis   . Seasonal allergies   . Frequency of urination   . Nocturia   . Anemia   . Lung mass PER DR CLANCE NOTE UNCLEAR IF LYMPHOMA FROM BX    FOLLOWED BY DR RUBIN  . Seizures   . Unspecified hereditary and idiopathic peripheral neuropathy   . Asthma   . Fibromyalgia   . Vertebral compression fracture 5/14    L4  . CHF (congestive heart failure)     Past Surgical History  Procedure Laterality Date  . Total abdominal hysterectomy w/ bilateral salpingoophorectomy  1977  (APPROX)  . Cholecystectomy    . Appendectomy    . Femur fracture surgery  12-18-2010  DR BEANE    INTRAMEDULLARY NAILING LEFT INTERTROCHANTERIC -SUBTROCHANTERIC FX  . Right breast lumpectomy /  removal lymph nodes x2/ removal pac  10-01-2010    CARCINOMA RIGHT BREAST  . Placement port- a- cath  02-18-2010  . Bronchoscopy  01-29-2010    W/ BX  . Tvt vaginal tape  suburethral sling   06-08-2005    SUI  . Bilateral laminectomy/ foraminotomy  01-14-2004    L4 - L5  . Breast lumpectomy  1996     LEFT BREAST DCIS   . Cataract extraction w/ intraocular lens  implant, bilateral    . Bilateral carpal tunnel release    . Tonsillectomy    .  Transthoracic echocardiogram  02-25-2010    LVSF NORMAL/ EF 64-68%/ GRADE I DIASTOLIC DYSFUNCTION/ MILDLY DILATED LEFT ATRIUM  . Hardware removal  10/11/2011    Procedure: HARDWARE REMOVAL;  Surgeon: Johnn Hai, MD;  Location: [facility];  Service: Orthopedics;  Laterality: Left;  LEFT THIGH REMOVAL OF DISTAL LOCKING SCREW NEEDS: FLORO, RADIO LUSCENT TABLE AND SCREWDRIVER FOR BIOMET ROD   . Colonoscopy    . Esophagogastroduodenoscopy N/A 03/11/2013    Procedure: ESOPHAGOGASTRODUODENOSCOPY (EGD);  Surgeon: Lear Ng, MD;  Location: [facility];  Service: Endoscopy;  Laterality: N/A;    Social History:  reports that she quit smoking about 36 years ago. Her smoking use included Cigarettes. She has a 30 pack-year smoking history. She has never used smokeless tobacco. She reports that she drinks about 4.2 - 6.0 oz of alcohol per week. She reports that she does not use illicit drugs.  Family History:  Family History  Problem Relation Age of Onset  . Emphysema Father   . CAD Father   . Ovarian cancer Mother   . Cancer Mother     cervical  . Alzheimer's disease Mother   . Hyperlipidemia Mother   . Breast cancer Maternal Grandmother   . Cancer Sister     cervical     Prior to Admission medications   Medication Sig Start Date End Date Taking? Authorizing Provider  aspirin EC 81 MG tablet Take 81 mg by mouth daily.   Yes Historical Provider, MD  dexlansoprazole (DEXILANT) 60 MG capsule Take 60 mg by mouth daily.    Yes Historical Provider, MD  DULoxetine (CYMBALTA) 60 MG capsule Take 60 mg by mouth daily after lunch.    Yes Historical Provider, MD  ergocalciferol (VITAMIN D2) 50000 UNITS capsule Take 50,000 Units by mouth once a week. Every Tuesday.   Yes Historical Provider, MD  ezetimibe-simvastatin (VYTORIN) 10-80 MG per tablet Take 1 tablet by mouth at bedtime. For hyperlipidemia   Yes Historical Provider, MD  gabapentin (NEURONTIN) 100 MG capsule Take 100 mg  by mouth daily.    Yes Historical Provider, MD  hyoscyamine (LEVBID) 0.375 MG 12 hr tablet Take 1 tablet by mouth daily. For bowel spasms. 07/10/12  Yes Historical Provider, MD  levETIRAcetam (KEPPRA) 500 MG tablet TAKE 1 TABLET TWICE A DAY 03/15/14  Yes Marcial Pacas, MD  levothyroxine (SYNTHROID, LEVOTHROID) 88 MCG tablet Take 88 mcg by mouth daily before breakfast. For hypothyroid   Yes Historical Provider, MD  LORazepam (ATIVAN) 0.5 MG tablet Take 0.5 mg by mouth daily. Anxiety 07/04/12  Yes Vivien Rota, NP  potassium chloride SA (K-DUR,KLOR-CON) 20 MEQ tablet Take 20 mEq by mouth daily. 04/17/13  Yes Sheela Stack, MD  predniSONE (DELTASONE) 5 MG tablet Take 5 mg by mouth daily.   Yes Historical Provider, MD  UCERIS 9 MG TB24 Take 3 capsules by mouth daily. 07/24/13  Yes Historical Provider, MD    Physical Exam: Filed Vitals:   04/09/14 2312 04/10/14 0215 04/10/14 0345 04/10/14 0531  BP: 137/86 143/76  139/70  Pulse: 113 95  90  Temp:  100.5 F (38.1 C) 98.3 F (36.8 C) 98.6 F (37 C)  TempSrc:  Oral Oral Oral  Resp: 16 18  18   Height:      Weight:      SpO2: 94% 100%  99%   General: Not in acute distress HEENT:       Eyes: PERRL, EOMI, no scleral icterus       ENT: No discharge from the ears and nose, no pharynx injection, no tonsillar enlargement.        Neck: No JVD, no bruit, no mass felt. Cardiac: S1/S2, RRR, No murmurs, No gallops or rubs Pulm: Good air movement bilaterally. Clear to auscultation bilaterally. No rales, wheezing, rhonchi or rubs. There is tenderness over frontal chest wall. Abd: Soft, nondistended, nontender, no rebound pain, no organomegaly, BS present Ext: 1+ pitting leg edema bilaterally. 2+DP/PT pulse bilaterally Musculoskeletal: No joint deformities, erythema, or stiffness, ROM full Skin: No rashes.  Neuro: Alert and oriented X3, cranial nerves II-XII grossly intact, muscle strength 5/5 in all extremeties, sensation to light touch intact.   Psych: Patient is not psychotic, no suicidal or hemocidal ideation.  Labs on Admission:  Basic Metabolic Panel:  Recent Labs Lab 04/09/14 2127  NA 138  K 4.0  CL 106  CO2 22  GLUCOSE 109*  BUN 17  CREATININE 0.86  CALCIUM 8.2*   Liver Function Tests:  Recent Labs Lab 04/09/14 2127  AST 22  ALT 20  ALKPHOS 88  BILITOT 0.6  PROT 7.1  ALBUMIN 3.7    Recent Labs Lab 04/09/14 2127  LIPASE 25   No results for input(s): AMMONIA in the last 168 hours. CBC:  Recent Labs Lab 04/09/14 2127  WBC 15.7*  NEUTROABS 11.3*  HGB 7.9*  HCT 28.8*  MCV 78.3  PLT 328   Cardiac Enzymes:  Recent Labs Lab 04/09/14 2127  TROPONINI <0.03    BNP (last 3 results)  Recent Labs  04/09/14 2127  BNP 187.3*    ProBNP (last 3 results) No results for input(s): PROBNP in the last 8760 hours.  CBG: No results for input(s): GLUCAP in the last 168 hours.  Radiological Exams on Admission: Dg Chest 2 View  04/09/2014   CLINICAL DATA:  Shortness of breath since 04/08/2014. Patient bent over today and felt a pop in the chest. Increasing sternal pain and shortness of breath.  EXAM: CHEST  2 VIEW  COMPARISON:  04/17/2013  FINDINGS: Shallow inspiration with elevated right hemidiaphragm. Heart size and pulmonary vascularity appear normal. No focal consolidation or airspace disease. No blunting of costophrenic angles. Esophageal hiatal hernia behind the heart. Surgical clips in the left breast and axilla. Degenerative changes in the spine with post kyphoplasty densities in the upper lumbar region. Multiple endplate compression deformities consistent with osteoporosis.  IMPRESSION: Shallow inspiration with elevation of right hemidiaphragm. No focal consolidation in the lungs.   Electronically Signed   By: Lucienne Capers M.D.   On: 04/09/2014 21:47   Ct Angio Chest Pe W/cm &/or Wo Cm  04/10/2014   CLINICAL DATA:  Shortness of breath and back pain. Elevated D-dimer. History of bilateral  breast cancer.  EXAM: CT ANGIOGRAPHY CHEST WITH CONTRAST  TECHNIQUE: Multidetector CT imaging of the chest was performed using the standard protocol during bolus administration of  intravenous contrast. Multiplanar CT image reconstructions and MIPs were obtained to evaluate the vascular anatomy.  CONTRAST:  126mL OMNIPAQUE IOHEXOL 350 MG/ML SOLN  COMPARISON:  07/31/2010  FINDINGS: Technically adequate study with good opacification of the central and segmental pulmonary arteries. No focal filling defects demonstrated. No evidence of significant pulmonary embolus.  Normal heart size. Normal caliber thoracic aorta. No evidence of aortic dissection. Coronary artery and aortic calcification. Great vessel origins are patent. Esophagus is decompressed. Moderate-sized esophageal hiatal hernia. No significant lymphadenopathy in the chest.  Evaluation of lungs is limited due to respiratory motion artifact. There are diffuse emphysematous changes throughout the lungs with interstitial and alveolar infiltrates most likely to represent edema or diffuse pneumonitis. No pleural effusions. No pneumothorax.  Included portions of the upper abdomen demonstrate a small low-attenuation lesion in the liver, probably representing a cyst and stable since prior study. Degenerative changes in the spine. Normal No destructive bone lesions. Multiple thoracic vertebral compression fractures, likely indicating osteoporosis. These appear to have been present on a previous chest radiograph from 04/17/2013, but progression is suggested.  Review of the MIP images confirms the above findings.  IMPRESSION: No evidence of significant pulmonary embolus. Diffuse airspace and interstitial disease throughout the lungs suggesting edema or infiltration. Diffuse emphysema. Multiple thoracic vertebral compression fractures demonstrating progression since previous study.   Electronically Signed   By: Lucienne Capers M.D.   On: 04/10/2014 00:12    EKG:  Independently reviewed.   Assessment/Plan Principal Problem:   CAP (community acquired pneumonia) Active Problems:   Obstructive sleep apnea   Breast cancer, stage 2 Right   Seasonal allergies   Seizures   Irritable bowel syndrome   Peripheral neuropathy   UGIB (upper gastrointestinal bleed)   DVT, LLE   Depression   Hypothyroidism   Sepsis  CAP: Patient's chest pain, shortness of breath, productive cough, plus CT angiogram findings are consistent with community-acquired pneumonia. Patient is mildly aseptic, but hemodynamically stable. -Will admit to Telemetry Bed - IV levaquin - Urine legionella and S. pneumococcal antigen - IVF: 500 cc of ns bolus and followed by 75 cc/h  - Follow up blood culture x2, sputum culture and respiratory virus panel -check lactic acid  Hx of GIB: Patient's hemoglobin decreased by 5 g since 08/01/13. FOBT negative. Unclear etiolgy. Not sure how acutely this happened. Total bilirubin is normal, indicating less likely to have hemolysis. -Follow up CBC -Anemia panel -Protonix  Seizure: Stable. Patient is on [place]. Last seizure was 2 months ago per patient. -Continue Keppra -check Keppra level  Hypothyroidism: Synthroid at home. TSH was 1.787 on 04/09/13 -Continue Synthroid  History of DVT and history of pulmonary embolism: Because of GI bleeding, patient is not a candidate for anticoagulation. -continue aspirin.  IBS: Stable -Continue hyoscyamine   DVT ppx: SQ Heparin      Code Status: Full code Family Communication: None at bed side.      Disposition Plan: Admit to inpatient   Date of Service 04/10/2014    Ivor Costa [facility] Pager [phone]  If 7PM-7AM, please contact night-coverage [email or web address] Password [facility] 04/10/2014, 7:54 AM

## 2014-04-11 LAB — CBC
HCT: 23.3 % — ABNORMAL LOW (ref 36.0–46.0)
HEMOGLOBIN: 6.4 g/dL — AB (ref 12.0–15.0)
MCH: 21.5 pg — AB (ref 26.0–34.0)
MCHC: 27.5 g/dL — AB (ref 30.0–36.0)
MCV: 78.5 fL (ref 78.0–100.0)
Platelets: 232 10*3/uL (ref 150–400)
RBC: 2.97 MIL/uL — ABNORMAL LOW (ref 3.87–5.11)
RDW: 17.4 % — AB (ref 11.5–15.5)
WBC: 8.4 10*3/uL (ref 4.0–10.5)

## 2014-04-11 LAB — BASIC METABOLIC PANEL
ANION GAP: 4 — AB (ref 5–15)
BUN: 12 mg/dL (ref 6–23)
CALCIUM: 7.9 mg/dL — AB (ref 8.4–10.5)
CO2: 23 mmol/L (ref 19–32)
CREATININE: 0.8 mg/dL (ref 0.50–1.10)
Chloride: 112 mmol/L (ref 96–112)
GFR calc Af Amer: 80 mL/min — ABNORMAL LOW (ref >90)
GFR calc non Af Amer: 69 mL/min — ABNORMAL LOW (ref >90)
Glucose, Bld: 112 mg/dL — ABNORMAL HIGH (ref 70–99)
Potassium: 4.3 mmol/L (ref 3.5–5.1)
Sodium: 139 mmol/L (ref 135–145)

## 2014-04-11 LAB — STREP PNEUMONIAE URINARY ANTIGEN: STREP PNEUMO URINARY ANTIGEN: NEGATIVE

## 2014-04-11 LAB — LEGIONELLA ANTIGEN, URINE

## 2014-04-11 LAB — PREPARE RBC (CROSSMATCH)

## 2014-04-11 MED ORDER — PREDNISONE 20 MG PO TABS
20.0000 mg | ORAL_TABLET | Freq: Every day | ORAL | Status: DC
  Administered 2014-04-12: 20 mg via ORAL
  Filled 2014-04-11: qty 1

## 2014-04-11 MED ORDER — GABAPENTIN 600 MG PO TABS
300.0000 mg | ORAL_TABLET | Freq: Two times a day (BID) | ORAL | Status: DC
  Filled 2014-04-11 (×2): qty 0.5

## 2014-04-11 MED ORDER — GABAPENTIN 300 MG PO CAPS
300.0000 mg | ORAL_CAPSULE | Freq: Two times a day (BID) | ORAL | Status: DC
  Administered 2014-04-11 – 2014-04-18 (×15): 300 mg via ORAL
  Filled 2014-04-11 (×16): qty 1

## 2014-04-11 MED ORDER — POLYSACCHARIDE IRON COMPLEX 150 MG PO CAPS
150.0000 mg | ORAL_CAPSULE | Freq: Every day | ORAL | Status: DC
  Administered 2014-04-11 – 2014-04-14 (×4): 150 mg via ORAL
  Filled 2014-04-11 (×4): qty 1

## 2014-04-11 MED ORDER — FUROSEMIDE 10 MG/ML IJ SOLN
20.0000 mg | Freq: Once | INTRAMUSCULAR | Status: AC
  Administered 2014-04-11: 20 mg via INTRAVENOUS
  Filled 2014-04-11: qty 2

## 2014-04-11 MED ORDER — DULOXETINE HCL 30 MG PO CPEP
30.0000 mg | ORAL_CAPSULE | Freq: Every day | ORAL | Status: DC
  Administered 2014-04-11 – 2014-04-18 (×8): 30 mg via ORAL
  Filled 2014-04-11 (×8): qty 1

## 2014-04-11 MED ORDER — SODIUM CHLORIDE 0.9 % IV SOLN: Freq: Once | INTRAVENOUS | Status: DC

## 2014-04-11 MED ORDER — INFLUENZA VAC SPLIT QUAD 0.5 ML IM SUSY
0.5000 mL | PREFILLED_SYRINGE | INTRAMUSCULAR | Status: AC | PRN
  Administered 2014-04-12: 0.5 mL via INTRAMUSCULAR

## 2014-04-11 NOTE — Progress Notes (Signed)
CRITICAL VALUE ALERT  Critical value received:  Hgb 6.4  Date of notification:  04/11/14  Time of notification:  06:15  Critical value read back:Yes.    Nurse who received alert:  Ruben Im, RN  MD notified (1st page):  Jennet Maduro, NP  Time of first page:  06:20  MD notified (2nd page):  Time of second page:  Responding MD:  Jennet Maduro, NP   Time MD responded:  06:25

## 2014-04-11 NOTE — Progress Notes (Signed)
OT Cancellation Note  Patient Details Name: [name] MRN: [id number] DOB: 1934-05-07   Cancelled Treatment:    Reason Eval/Treat Not Completed: Other (comment).  Noted pt's HgB is 6.4, symptomatic and plan is for 2 units of blood.  Will check back tomorrow.  Gustavo Meditz 04/11/2014, 12:54 PM  Lesle Chris, OTR/L [phone] 04/11/2014

## 2014-04-11 NOTE — Progress Notes (Signed)
Subjective: Had a rough night with generalized discomfort. Some pain in arms. Little cough.no pleuritic CP. No BM yet. No chills or sweats   Objective: Vital signs in last 24 hours: Temp:  [98.1 F (36.7 C)-98.6 F (37 C)] 98.1 F (36.7 C) (02/11 0607) Pulse Rate:  [86-97] 86 (02/11 0607) Resp:  [16-21] 21 (02/11 0607) BP: (125-155)/(48-65) 155/58 mmHg (02/11 0607) SpO2:  [92 %-100 %] 100 % (02/11 0607)  Intake/Output from previous day: 02/10 0701 - 02/11 0700 In: 2600 [I.V.:2500; IV Piggyback:100] Out: 1200 [Urine:1200] Intake/Output this shift:    Chronically ill mildly toxic, cushingoid features, anicteric, oral membranes moist. Lungs bilat wheeze, some rhonchi, ht regular fast,abd soft obese NT, 1+ edema, awake, mentating OK, tremulous, thin skin with bruising  Lab Results   Recent Labs  04/09/14 2127 04/10/14 0900 04/11/14 0530  WBC 15.7*  --  8.4  RBC 3.68* 3.38* 2.97*  HGB 7.9*  --  6.4*  HCT 28.8*  --  23.3*  MCV 78.3  --  78.5  MCH 21.5*  --  21.5*  RDW 17.3*  --  17.4*  PLT 328  --  232    Recent Labs  04/09/14 2127 04/11/14 0530  NA 138 139  K 4.0 4.3  CL 106 112  CO2 22 23  GLUCOSE 109* 112*  BUN 17 12  CREATININE 0.86 0.80  CALCIUM 8.2* 7.9*    Studies/Results: Dg Chest 2 View  04/09/2014   CLINICAL DATA:  Shortness of breath since 04/08/2014. Patient bent over today and felt a pop in the chest. Increasing sternal pain and shortness of breath.  EXAM: CHEST  2 VIEW  COMPARISON:  04/17/2013  FINDINGS: Shallow inspiration with elevated right hemidiaphragm. Heart size and pulmonary vascularity appear normal. No focal consolidation or airspace disease. No blunting of costophrenic angles. Esophageal hiatal hernia behind the heart. Surgical clips in the left breast and axilla. Degenerative changes in the spine with post kyphoplasty densities in the upper lumbar region. Multiple endplate compression deformities consistent with osteoporosis.  IMPRESSION:  Shallow inspiration with elevation of right hemidiaphragm. No focal consolidation in the lungs.   Electronically Signed   By: Lucienne Capers M.D.   On: 04/09/2014 21:47   Ct Angio Chest Pe W/cm &/or Wo Cm  04/10/2014   CLINICAL DATA:  Shortness of breath and back pain. Elevated D-dimer. History of bilateral breast cancer.  EXAM: CT ANGIOGRAPHY CHEST WITH CONTRAST  TECHNIQUE: Multidetector CT imaging of the chest was performed using the standard protocol during bolus administration of intravenous contrast. Multiplanar CT image reconstructions and MIPs were obtained to evaluate the vascular anatomy.  CONTRAST:  1109mL OMNIPAQUE IOHEXOL 350 MG/ML SOLN  COMPARISON:  07/31/2010  FINDINGS: Technically adequate study with good opacification of the central and segmental pulmonary arteries. No focal filling defects demonstrated. No evidence of significant pulmonary embolus.  Normal heart size. Normal caliber thoracic aorta. No evidence of aortic dissection. Coronary artery and aortic calcification. Great vessel origins are patent. Esophagus is decompressed. Moderate-sized esophageal hiatal hernia. No significant lymphadenopathy in the chest.  Evaluation of lungs is limited due to respiratory motion artifact. There are diffuse emphysematous changes throughout the lungs with interstitial and alveolar infiltrates most likely to represent edema or diffuse pneumonitis. No pleural effusions. No pneumothorax.  Included portions of the upper abdomen demonstrate a small low-attenuation lesion in the liver, probably representing a cyst and stable since prior study. Degenerative changes in the spine. Normal No destructive bone lesions. Multiple thoracic vertebral  compression fractures, likely indicating osteoporosis. These appear to have been present on a previous chest radiograph from 04/17/2013, but progression is suggested.  Review of the MIP images confirms the above findings.  IMPRESSION: No evidence of significant pulmonary  embolus. Diffuse airspace and interstitial disease throughout the lungs suggesting edema or infiltration. Diffuse emphysema. Multiple thoracic vertebral compression fractures demonstrating progression since previous study.   Electronically Signed   By: Lucienne Capers M.D.   On: 04/10/2014 00:12    Scheduled Meds: . sodium chloride   Intravenous Once  . sodium chloride   Intravenous Once  . aspirin EC  81 mg Oral Daily  . furosemide  20 mg Intravenous Once  . heparin  5,000 Units Subcutaneous 3 times per day  . hyoscyamine  0.375 mg Oral Daily  . Influenza vac split quadrivalent PF  0.5 mL Intramuscular Tomorrow-1000  . iron polysaccharides  150 mg Oral Daily  . levETIRAcetam  500 mg Oral BID  . levofloxacin  500 mg Intravenous QHS  . levothyroxine  88 mcg Oral QAC breakfast  . pantoprazole  40 mg Oral Daily  . [START ON 04/12/2014] predniSONE  20 mg Oral Q breakfast   Continuous Infusions: . sodium chloride 100 mL/hr at 04/11/14 0540   PRN Meds:fentaNYL, LORazepam, oxyCODONE-acetaminophen  Assessment/Plan: PNEUMONIA: has defervesced, WBC improved. On levaquin IRON DEFICIENT ANEMIA, SYMPTOMATIC: down to 6.4, Rx 2 units for now ADRENAL INSUFFIC: needs to be on stress steroids, RX 20 pred HYPOTHYROID: on Rx DEPRESSION: has been profound, restart cymbalta GERD; on Rx PROTEIN CALORIE MALNUTRITION: has been increasingly an issue IMPAIRED FASTING GLUCOSE: minor elevations OSTEOPOROSIS WITH FRACTURES: check vit D  BREAST CANCER: since 2011, NED HX PULMONARY EMBOLUS: on heparin now NEUROPATHY FROM CHEMO; restart neurontin SEIZURE DISORDER: on Rx  LOS: 1 day   [name] [name] 04/11/2014, 9:06 AM

## 2014-04-12 ENCOUNTER — Inpatient Hospital Stay (HOSPITAL_COMMUNITY): Payer: Medicare Other

## 2014-04-12 DIAGNOSIS — J189 Pneumonia, unspecified organism: Secondary | ICD-10-CM | POA: Diagnosis not present

## 2014-04-12 LAB — TYPE AND SCREEN
ABO/RH(D): O POS
Antibody Screen: NEGATIVE
UNIT DIVISION: 0
UNIT DIVISION: 0

## 2014-04-12 LAB — RESPIRATORY VIRUS PANEL
ADENOVIRUS: NEGATIVE
Influenza A: NEGATIVE
Influenza B: NEGATIVE
Metapneumovirus: NEGATIVE
Parainfluenza 1: NEGATIVE
Parainfluenza 2: NEGATIVE
Parainfluenza 3: NEGATIVE
RESPIRATORY SYNCYTIAL VIRUS A: NEGATIVE
Respiratory Syncytial Virus B: NEGATIVE
Rhinovirus: NEGATIVE

## 2014-04-12 LAB — COMPREHENSIVE METABOLIC PANEL
ALK PHOS: 69 U/L (ref 39–117)
ALT: 16 U/L (ref 0–35)
ANION GAP: 5 (ref 5–15)
AST: 14 U/L (ref 0–37)
Albumin: 2.6 g/dL — ABNORMAL LOW (ref 3.5–5.2)
BILIRUBIN TOTAL: 0.4 mg/dL (ref 0.3–1.2)
BUN: 14 mg/dL (ref 6–23)
CHLORIDE: 105 mmol/L (ref 96–112)
CO2: 25 mmol/L (ref 19–32)
Calcium: 7.9 mg/dL — ABNORMAL LOW (ref 8.4–10.5)
Creatinine, Ser: 0.85 mg/dL (ref 0.50–1.10)
GFR calc Af Amer: 75 mL/min — ABNORMAL LOW (ref >90)
GFR calc non Af Amer: 64 mL/min — ABNORMAL LOW (ref >90)
GLUCOSE: 94 mg/dL (ref 70–99)
POTASSIUM: 3.7 mmol/L (ref 3.5–5.1)
Sodium: 135 mmol/L (ref 135–145)
Total Protein: 5.7 g/dL — ABNORMAL LOW (ref 6.0–8.3)

## 2014-04-12 LAB — CBC
HCT: 31.3 % — ABNORMAL LOW (ref 36.0–46.0)
Hemoglobin: 9.2 g/dL — ABNORMAL LOW (ref 12.0–15.0)
MCH: 23.5 pg — ABNORMAL LOW (ref 26.0–34.0)
MCHC: 29.4 g/dL — AB (ref 30.0–36.0)
MCV: 80.1 fL (ref 78.0–100.0)
PLATELETS: 228 10*3/uL (ref 150–400)
RBC: 3.91 MIL/uL (ref 3.87–5.11)
RDW: 17.3 % — ABNORMAL HIGH (ref 11.5–15.5)
WBC: 8.4 10*3/uL (ref 4.0–10.5)

## 2014-04-12 MED ORDER — POLYETHYLENE GLYCOL 3350 17 G PO PACK: 17.0000 g | PACK | Freq: Every day | ORAL | Status: DC | PRN

## 2014-04-12 MED ORDER — PREDNISONE 5 MG PO TABS
10.0000 mg | ORAL_TABLET | Freq: Every day | ORAL | Status: DC
  Administered 2014-04-13 – 2014-04-16 (×4): 10 mg via ORAL
  Filled 2014-04-12 (×4): qty 2

## 2014-04-12 NOTE — Progress Notes (Signed)
Subjective: Feels a lot better. Tolerated the transfusion. Was eating when they came for XR, to be done later. Min cough. Eating OK> some constipation   Objective: Vital signs in last 24 hours: Temp:  [97.8 F (36.6 C)-98.9 F (37.2 C)] 98.9 F (37.2 C) (02/12 0455) Pulse Rate:  [87-105] 91 (02/12 0455) Resp:  [16-20] 16 (02/12 0455) BP: (123-151)/(46-69) 147/68 mmHg (02/12 0455) SpO2:  [96 %-100 %] 99 % (02/12 0455)  Intake/Output from previous day: 02/11 0701 - 02/12 0700 In: 1720 [I.V.:820; Blood:800; IV Piggyback:100] Out: 1450 [Urine:1450] Intake/Output this shift:    Much healthier, round face. Lungs a few rhonchi no wheeze, ht sl irreg. abd soft NT, trace edema. Stronger, clear speech, in good spirits  Lab Results   Recent Labs  04/11/14 0530 04/12/14 0805  WBC 8.4 8.4  RBC 2.97* 3.91  HGB 6.4* 9.2*  HCT 23.3* 31.3*  MCV 78.5 80.1  MCH 21.5* 23.5*  RDW 17.4* 17.3*  PLT 232 228    Recent Labs  04/11/14 0530 04/12/14 0805  NA 139 135  K 4.3 3.7  CL 112 105  CO2 23 25  GLUCOSE 112* 94  BUN 12 14  CREATININE 0.80 0.85  CALCIUM 7.9* 7.9*    Studies/Results: No results found.  Scheduled Meds: . sodium chloride   Intravenous Once  . sodium chloride   Intravenous Once  . aspirin EC  81 mg Oral Daily  . DULoxetine  30 mg Oral Daily  . gabapentin  300 mg Oral BID  . heparin  5,000 Units Subcutaneous 3 times per day  . hyoscyamine  0.375 mg Oral Daily  . Influenza vac split quadrivalent PF  0.5 mL Intramuscular Tomorrow-1000  . iron polysaccharides  150 mg Oral Q lunch  . levETIRAcetam  500 mg Oral BID  . levofloxacin  500 mg Intravenous QHS  . levothyroxine  88 mcg Oral QAC breakfast  . pantoprazole  40 mg Oral Daily  . predniSONE  20 mg Oral Q breakfast   Continuous Infusions: . sodium chloride 100 mL/hr at 04/11/14 0540   PRN Meds:fentaNYL, LORazepam, oxyCODONE-acetaminophen  Assessment/Plan:   PNEUMONIA: has defervesced, WBC improved. On  levaquin. XR pending IRON DEFICIENT ANEMIA, SYMPTOMATIC: up to 9.2 ADRENAL INSUFFIC: needs to be on stress steroids, RX 20 pred HYPOTHYROID: on Rx DEPRESSION: has been profound, restart cymbalta GERD; on Rx PROTEIN CALORIE MALNUTRITION: has been increasingly an issue IMPAIRED FASTING GLUCOSE: minor elevations OSTEOPOROSIS WITH FRACTURES: check vit D BREAST CANCER: since 2011, NED HX PULMONARY EMBOLUS: on heparin now NEUROPATHY FROM CHEMO; restart neurontin SEIZURE DISORDER: on Rx  LOS: 2 days   [name] 04/12/2014, 11:52 AM

## 2014-04-12 NOTE — Progress Notes (Signed)
Physical Therapy Treatment Patient Details Name: [name] MRN: [id number] DOB: 14-Oct-1934 Today's Date: 04/12/2014    History of Present Illness 80 y.o. female with past medical history of DVT, history of pulmonary embolism on aspirin (patient is not a good candidate for anticoagulation due to history of GI bleeding), congestive heart failure, hyperlipidemia, history of breast cancer (s/p surgery, chemotherapy and radiation therapy), history of seizure, asthma and admitted 04/09/14 with SOB.    PT Comments    Progressing with mobility. Still unsteady at times.   Follow Up Recommendations  Home health PT;Supervision for mobility/OOB     Equipment Recommendations  None recommended by PT    Recommendations for Other Services       Precautions / Restrictions Precautions Precautions: Fall Precaution Comments: monitor sats Restrictions Weight Bearing Restrictions: No    Mobility  Bed Mobility Overal bed mobility: Needs Assistance Bed Mobility: Supine to Sit;Sit to Supine     Supine to sit: Supervision Sit to supine: Supervision   General bed mobility comments: increased time and effort however no physical assist required  Transfers Overall transfer level: Needs assistance Equipment used: Rolling walker (2 wheeled) Transfers: Sit to/from Stand Sit to Stand: Min guard         General transfer comment: close guard for safety.   Ambulation/Gait Ambulation/Gait assistance: Min assist Ambulation Distance (Feet): 200 Feet Assistive device: Rolling walker (2 wheeled) Gait Pattern/deviations: Step-through pattern;Decreased stride length     General Gait Details: Unsteady at times. 2 brief standing rest breaks.    Stairs            Wheelchair Mobility    Modified Rankin (Stroke Patients Only)       Balance                                    Cognition Arousal/Alertness: Awake/alert Behavior During Therapy: WFL for tasks  assessed/performed Overall Cognitive Status: Within Functional Limits for tasks assessed                      Exercises      General Comments        Pertinent Vitals/Pain Pain Assessment: Faces Faces Pain Scale: Hurts even more Pain Location: back with activity Pain Descriptors / Indicators: Aching;Sore Pain Intervention(s): Monitored during session    Home Living                      Prior Function            PT Goals (current goals can now be found in the care plan section) Progress towards PT goals: Progressing toward goals    Frequency  Min 3X/week    PT Plan Current plan remains appropriate    Co-evaluation             End of Session   Activity Tolerance: Patient tolerated treatment well Patient left: with call bell/phone within reach;in bed     Time: 2694-8546 PT Time Calculation (min) (ACUTE ONLY): 13 min  Charges:  $Gait Training: 8-22 mins                    G Codes:      Weston Anna, MPT Pager: [phone]

## 2014-04-12 NOTE — Consult Note (Signed)
WOC wound consult note Reason for Consult:Skin tears noted on admission  Wound type: medical adhesive related skin injury (MARSI) Pressure Ulcer POA: No Measurement: 3cm x 0.4cm x 0.2cm (avulsion with replacement of epidermis over 80% of area) Wound bed:red, moist Drainage (amount, consistency, odor) scant serous Periwound:ecchymotic Dressing procedure/placement/frequency: a soft silicone dressing is in place and will be left in place for 7 days unless drainage strike-through appears on exterior of dressings. These can be removed atraumatically and will be least likely over others to disrupt the healing epidermis beneath upon removal. Nursing has been given guidance by way of skin care orders for gentle adhesive removal and selection of only silicone or paper tapes. [facility] nursing team will not follow, but will remain available to this patient, the nursing and medical team.  Please re-consult if needed. Thanks, Maudie Flakes, MSN, RN, [place], [place], [place] [phone])

## 2014-04-12 NOTE — Consult Note (Signed)
Subjective:   HPI  The patient is a 80 year old female who was admitted to the hospital 2 days ago with pneumonia. She is feeling better in regards to that. We are asked to see her in regards to iron deficiency anemia. Her lowest hemoglobin here in the hospital was 6.4. The patient states that she has not seen any bleeding. She states however that she will occasionally see some dark colored stool. Her stool when checked here in the hospital was heme negative. She denies abdominal pain hematemesis or hematochezia. She had a GI bleed in January 2015. An EGD showed H. pylori gastritis. She had a pyloric channel ulcer. There was also a hiatal hernia and a duodenal AVM. She had a colonoscopy in December 2013 which was negative except for hemorrhoids. She does have a history of microscopic colitis. She used to be on anticoagulants but not anymore because of a history of GI bleed.  Review of Systems No current complaints of chest pain. A little short of breath  Past Medical History  Diagnosis Date  . IBS (irritable bowel syndrome)   . Fibromyalgia   . History of DVT (deep vein thrombosis)   . History of pulmonary embolism   . Osteoporosis   . History of breast cancer LEFT BREAST DCIS  1996  . Hyperlipidemia   . 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 Breast cancer RIGHT SIDE-- DX OCT 2011    CHEMORADIATION THERAPY-- COMPLETED   . PONV (postoperative nausea and vomiting)   . Skin tear LEFT ARM COVERED W/ TEGADERM    PT STATES HX MULTIPLE SKIN TEARS -- THIN  . Thin skin SECONARDY AGE AND HX CHEMO  . Ecchymosis   . Seasonal allergies   . Frequency of urination   . Nocturia   . Anemia   . Lung mass PER DR CLANCE NOTE UNCLEAR IF LYMPHOMA FROM BX    FOLLOWED BY DR RUBIN  . Seizures   . Unspecified hereditary and idiopathic peripheral neuropathy   . Asthma   . Fibromyalgia   . Vertebral compression fracture 5/14    L4  . CHF (congestive heart failure)     Past Surgical History  Procedure Laterality Date  . Total abdominal hysterectomy w/ bilateral salpingoophorectomy  1977  (APPROX)  . Cholecystectomy    . Appendectomy    . Femur fracture surgery  12-18-2010  DR BEANE    INTRAMEDULLARY NAILING LEFT INTERTROCHANTERIC -SUBTROCHANTERIC FX  . Right breast lumpectomy / removal lymph nodes x2/ removal pac  10-01-2010    CARCINOMA RIGHT BREAST  . Placement port- a- cath  02-18-2010  . Bronchoscopy  01-29-2010    W/ BX  . Tvt vaginal tape  suburethral sling   06-08-2005    SUI  . Bilateral laminectomy/ foraminotomy  01-14-2004    L4 - L5  . Breast lumpectomy  1996     LEFT BREAST DCIS   . Cataract extraction w/ intraocular lens  implant, bilateral    . Bilateral carpal tunnel release    . Tonsillectomy    . Transthoracic echocardiogram  02-25-2010    LVSF NORMAL/ EF 41-93%/ GRADE I DIASTOLIC DYSFUNCTION/ MILDLY DILATED LEFT ATRIUM  . Hardware removal  10/11/2011    Procedure: HARDWARE REMOVAL;  Surgeon: Johnn Hai, MD;  Location: [facility];  Service: Orthopedics;  Laterality: Left;  LEFT THIGH REMOVAL OF DISTAL LOCKING SCREW NEEDS: FLORO, RADIO LUSCENT TABLE AND SCREWDRIVER FOR BIOMET ROD   .  Colonoscopy    . Esophagogastroduodenoscopy N/A 03/11/2013    Procedure: ESOPHAGOGASTRODUODENOSCOPY (EGD);  Surgeon: Lear Ng, MD;  Location: [facility];  Service: Endoscopy;  Laterality: N/A;   History   Social History  . Marital Status: Married    Spouse Name: N/A  . Number of Children: N/A  . Years of Education: N/A   Occupational History  . retired    Social History Main Topics  . Smoking status: Former Smoker -- 1.00 packs/day for 30 years    Types: Cigarettes    Quit date: 03/01/1978  . Smokeless tobacco: Never Used  . Alcohol Use: 4.2 - 6.0 oz/week    7-10 Glasses of wine per week  . Drug Use: No  . Sexual Activity: No   Other Topics Concern  . Not on file   Social History Narrative    family history includes Alzheimer's disease in her mother; Breast cancer in her maternal grandmother; CAD in her father; Cancer in her mother and sister; Emphysema in her father; Hyperlipidemia in her mother; Ovarian cancer in her mother.  Current facility-administered medications:  .  0.9 %  sodium chloride infusion, , Intravenous, Continuous, Sheela Stack, MD, Last Rate: 50 mL/hr at 04/12/14 1157 .  0.9 %  sodium chloride infusion, , Intravenous, Once, Jeryl Columbia, NP .  0.9 %  sodium chloride infusion, , Intravenous, Once, Sheela Stack, MD .  aspirin EC tablet 81 mg, 81 mg, Oral, Daily, Ivor Costa, MD, 81 mg at 04/12/14 1005 .  DULoxetine (CYMBALTA) DR capsule 30 mg, 30 mg, Oral, Daily, Sheela Stack, MD, 30 mg at 04/12/14 1005 .  fentaNYL (SUBLIMAZE) injection 50 mcg, 50 mcg, Intravenous, Q2H PRN, Rhetta Mura Schorr, NP, 50 mcg at 04/11/14 1042 .  gabapentin (NEURONTIN) capsule 300 mg, 300 mg, Oral, BID, Sheela Stack, MD, 300 mg at 04/12/14 1005 .  heparin injection 5,000 Units, 5,000 Units, Subcutaneous, 3 times per day, Ivor Costa, MD, 5,000 Units at 04/12/14 1410 .  hyoscyamine (LEVBID) 0.375 MG 12 hr tablet 0.375 mg, 0.375 mg, Oral, Daily, Ivor Costa, MD, 0.375 mg at 04/12/14 1005 .  Influenza vac split quadrivalent PF (FLUARIX) injection 0.5 mL, 0.5 mL, Intramuscular, Tomorrow-1000, Ivor Costa, MD .  iron polysaccharides (NIFEREX) capsule 150 mg, 150 mg, Oral, Q lunch, Sheela Stack, MD, 150 mg at 04/12/14 1220 .  levETIRAcetam (KEPPRA) tablet 500 mg, 500 mg, Oral, BID, Ivor Costa, MD, 500 mg at 04/12/14 1005 .  levofloxacin (LEVAQUIN) IVPB 500 mg, 500 mg, Intravenous, QHS, Ivor Costa, MD, 500 mg at 04/11/14 2157 .  levothyroxine (SYNTHROID, LEVOTHROID) tablet 88 mcg, 88 mcg, Oral, QAC breakfast, Ivor Costa, MD, 88 mcg at 04/12/14 0727 .  LORazepam (ATIVAN) tablet 0.5 mg, 0.5 mg, Oral, Daily PRN, Ivor Costa, MD .  oxyCODONE-acetaminophen (PERCOCET/ROXICET)  5-325 MG per tablet 1 tablet, 1 tablet, Oral, Q6H PRN, Ivor Costa, MD, 1 tablet at 04/12/14 1523 .  pantoprazole (PROTONIX) EC tablet 40 mg, 40 mg, Oral, Daily, Ivor Costa, MD, 40 mg at 04/12/14 1005 .  polyethylene glycol (MIRALAX / GLYCOLAX) packet 17 g, 17 g, Oral, Daily PRN, Sheela Stack, MD .  Derrill Memo ON 04/13/2014] predniSONE (DELTASONE) tablet 10 mg, 10 mg, Oral, Q breakfast, Sheela Stack, MD  Facility-Administered Medications Ordered in Other Encounters:  .  fentaNYL (SUBLIMAZE) injection 25-50 mcg, 25-50 mcg, Intravenous, Q5 min PRN, Peyton Najjar, MD .  lactated ringers infusion, , Intravenous, Continuous, Peyton Najjar, MD  Allergies  Allergen Reactions  . Penicillins Anaphylaxis  . Adhesive [Tape] Other (See Comments)    Blisters   . Doxycycline Nausea And Vomiting  . Morphine And Related Other (See Comments)    HALLUCINATIONS     Objective:     BP 153/87 mmHg  Pulse 87  Temp(Src) 98.7 F (37.1 C) (Oral)  Resp 20  Ht 5\' 6"  (1.676 m)  Wt 74.39 kg (164 lb)  BMI 26.48 kg/m2  SpO2 96%  She is in no distress  Nonicteric  Heart regular rhythm no murmurs  Lungs clear  Abdomen: Bowel sounds normal, soft, nontender  Laboratory No components found for: D1    Assessment:     Pneumonia  Multiple other medical problems  Iron deficiency anemia      Plan:     Given the low hemoglobin and hematocrit I think it would be reasonable to reevaluate the patient. We can set her up for EGD and colonoscopy on Monday. This will give her a little more time to improve from her pneumonia. Lab Results  Component Value Date   HGB 9.2* 04/12/2014   HGB 6.4* 04/11/2014   HGB 7.9* 04/09/2014   HGB 12.6 08/01/2013   HGB 11.8 01/04/2013   HGB 12.3 07/04/2012   HCT 31.3* 04/12/2014   HCT 23.3* 04/11/2014   HCT 28.8* 04/09/2014   HCT 39.1 08/01/2013   HCT 36.6 01/04/2013   HCT 36.2 07/04/2012   ALKPHOS 69 04/12/2014   ALKPHOS 88 04/09/2014   ALKPHOS 111  08/01/2013   ALKPHOS 77 04/17/2013   ALKPHOS 139 01/04/2013   ALKPHOS 72 07/04/2012   AST 14 04/12/2014   AST 22 04/09/2014   AST 12 08/01/2013   AST 20 04/17/2013   AST 17 01/04/2013   AST 20 07/04/2012   ALT 16 04/12/2014   ALT 20 04/09/2014   ALT 9 08/01/2013   ALT 24 04/17/2013   ALT 30 01/04/2013   ALT 22 07/04/2012

## 2014-04-12 NOTE — Evaluation (Signed)
Occupational Therapy Evaluation Patient Details Name: [name] MRN: [id number] DOB: [birth year] Today's Date: 04/12/2014    History of Present Illness 80 y.o. female with past medical history of DVT, history of pulmonary embolism on aspirin (patient is not a good candidate for anticoagulation due to history of GI bleeding), congestive heart failure, hyperlipidemia, history of breast cancer (s/p surgery, chemotherapy and radiation therapy), history of seizure, asthma and admitted 04/09/14 with SOB.   Clinical Impression   Pt is a 80 y/o female admitted as above presenting with deficits in her ability to perform ADL and functional transfers related to ADL's (see OT problem list below). She should benefit from acute OT to address deficits and assist in maximizing I w/ ADL's/transfers. Will follow acutely.    Follow Up Recommendations  Home health OT;Supervision/Assistance - 24 hour (Pt currently declining SNF, prefers Home w/ family assist and HHOT/PT)    Equipment Recommendations  None recommended by OT;Other (comment) (Pt reports no DME needs at this time)    Recommendations for Other Services       Precautions / Restrictions Precautions Precautions: Fall Precaution Comments: monitor sats Restrictions Weight Bearing Restrictions: No      Mobility Bed Mobility Overal bed mobility: Needs Assistance Bed Mobility: Supine to Sit     Supine to sit: Supervision     General bed mobility comments: increased time and effort however no physical assist required  Transfers Overall transfer level: Needs assistance Equipment used: Rolling walker (2 wheeled) Transfers: Sit to/from Stand Sit to Stand: Min assist         General transfer comment: verbal cues for safety, assist to rise and steady & for side stepping to [facility] prior to sitting    Balance Overall balance assessment: Needs assistance Sitting-balance support: Bilateral upper extremity supported;Feet  supported Sitting balance-Leahy Scale: Fair   Postural control: Posterior lean Standing balance support: Bilateral upper extremity supported Standing balance-Leahy Scale: Poor                              ADL Overall ADL's : Needs assistance/impaired     Grooming: Wash/dry hands;Wash/dry face;Minimal assistance;Bed level   Upper Body Bathing: Minimal assitance;Bed level   Lower Body Bathing: Moderate assistance;Sit to/from stand   Upper Body Dressing : Minimal assistance;Sitting   Lower Body Dressing: Moderate assistance;Sit to/from stand   Toilet Transfer: Minimal assistance;BSC;RW;Stand-pivot (Simulated sit to stand from EOB (pt declined 3:1 use and sitting in chair as well)) Toilet Transfer Details (indicate cue type and reason):  (Pt very shaky & unsteady in standing. Able to take several steps to [facility] for better positioning w/ 2 HHA vs RW) Toileting- Clothing Manipulation and Hygiene: Moderate assistance;Sit to/from stand       Functional mobility during ADLs: Minimal assistance;Rolling walker;+2 for safety/equipment General ADL Comments: Pt was educated in role of OT and recommendations for ADL retraining and functional mobility for transfer training. Discussed possible SNF Rehab however pt states that she plans to d/c home w/ family PRN assist. Pt currently unable to sit EOB >2 min or stand >2-3 min dues to pain. RN notified and brought meds at end of session. Pt will benefit from 24/hr supervision/assist and HHOT secondary to declining SNF at this time.     Vision Vision Assessment?: Vision impaired- to be further tested in functional context. Pt reports >1 yr h/o Diplopia. "I focus on an object and look straight ahead" Pt reports eye Dr  is aware of this; cont to assess.   Perception     Praxis      Pertinent Vitals/Pain Pain Assessment: 0-10 Pain Score: 5  Pain Location: "all over" Pain Descriptors / Indicators: Other (Comment) (Aching all over sweeps  hands over abdomen and chest) Pain Intervention(s): Monitored during session;Repositioned;Patient requesting pain meds-RN notified     Hand Dominance Right   Extremity/Trunk Assessment Upper Extremity Assessment Upper Extremity Assessment: Generalized weakness   Lower Extremity Assessment Lower Extremity Assessment: Defer to PT evaluation       Communication Communication Communication: No difficulties   Cognition Arousal/Alertness: Awake/alert Behavior During Therapy: WFL for tasks assessed/performed Overall Cognitive Status: Within Functional Limits for tasks assessed                     General Comments       Exercises       Shoulder Instructions      Home Living Family/patient expects to be discharged to:: Private residence Living Arrangements: Children (Son lives with her) Available Help at Discharge: Family;Available 24 hours/day Type of Home: House Home Access: Stairs to enter [employer] of Steps: 2 Entrance Stairs-Rails: None Home Layout: One level     Bathroom Shower/Tub: Tub/shower unit Shower/tub characteristics: Curtain Biochemist, clinical: Handicapped height Bathroom Accessibility: Yes How Accessible: Accessible via walker Home Equipment: Ute - 4 wheels;Shower seat;Bedside commode;Grab bars - toilet;Grab bars - tub/shower          Prior Functioning/Environment Level of Independence: Independent with assistive device(s)        Comments: uses 4 wheeled walker typically    OT Diagnosis: Generalized weakness;Acute pain;Other (comment) (h/o Diplopia for ~1 yr per pt report)   OT Problem List: Decreased strength;Decreased activity tolerance;Impaired balance (sitting and/or standing);Impaired vision/perception;Decreased knowledge of use of DME or AE;Pain;Cardiopulmonary status limiting activity   OT Treatment/Interventions: Self-care/ADL training;Therapeutic exercise;Energy conservation;DME and/or AE instruction;Patient/family  education;Therapeutic activities;Balance training    OT Goals(Current goals can be found in the care plan section) Acute Rehab OT Goals Patient Stated Goal: To go home and "To figure out where this blood is going"  Time For Goal Achievement: 04/26/14 Potential to Achieve Goals: Fair  OT Frequency: Min 2X/week   Barriers to D/C:            Co-evaluation              End of Session Equipment Utilized During Treatment: Gait belt;Other (comment);Oxygen (RW recommended for pt use, 2 HHA today ) Nurse Communication: Patient requests pain meds  Activity Tolerance: Patient limited by pain Patient left: in bed;with call bell/phone within reach;with nursing/sitter in room   Time: 4081-4481 OT Time Calculation (min): 21 min Charges:  OT General Charges $OT Visit: 1 Procedure OT Evaluation $Initial OT Evaluation Tier I: 1 Procedure G-Codes:    Eeva Schlosser Beth Dixon. OTR/L 04/12/2014, 8:52 AM

## 2014-04-13 ENCOUNTER — Inpatient Hospital Stay (HOSPITAL_COMMUNITY): Payer: Medicare Other

## 2014-04-13 LAB — CBC
HEMATOCRIT: 31.7 % — AB (ref 36.0–46.0)
Hemoglobin: 9.2 g/dL — ABNORMAL LOW (ref 12.0–15.0)
MCH: 23.4 pg — AB (ref 26.0–34.0)
MCHC: 29 g/dL — AB (ref 30.0–36.0)
MCV: 80.7 fL (ref 78.0–100.0)
Platelets: 263 10*3/uL (ref 150–400)
RBC: 3.93 MIL/uL (ref 3.87–5.11)
RDW: 18.2 % — AB (ref 11.5–15.5)
WBC: 7.8 10*3/uL (ref 4.0–10.5)

## 2014-04-13 LAB — COMPREHENSIVE METABOLIC PANEL
ALT: 20 U/L (ref 0–35)
AST: 21 U/L (ref 0–37)
Albumin: 2.6 g/dL — ABNORMAL LOW (ref 3.5–5.2)
Alkaline Phosphatase: 78 U/L (ref 39–117)
Anion gap: 7 (ref 5–15)
BUN: 16 mg/dL (ref 6–23)
CHLORIDE: 104 mmol/L (ref 96–112)
CO2: 28 mmol/L (ref 19–32)
Calcium: 8.7 mg/dL (ref 8.4–10.5)
Creatinine, Ser: 0.99 mg/dL (ref 0.50–1.10)
GFR calc Af Amer: 62 mL/min — ABNORMAL LOW (ref >90)
GFR calc non Af Amer: 54 mL/min — ABNORMAL LOW (ref >90)
Glucose, Bld: 106 mg/dL — ABNORMAL HIGH (ref 70–99)
POTASSIUM: 3.8 mmol/L (ref 3.5–5.1)
Sodium: 139 mmol/L (ref 135–145)
Total Bilirubin: 0.5 mg/dL (ref 0.3–1.2)
Total Protein: 6.1 g/dL (ref 6.0–8.3)

## 2014-04-13 MED ORDER — DOCUSATE SODIUM 100 MG PO CAPS
100.0000 mg | ORAL_CAPSULE | Freq: Two times a day (BID) | ORAL | Status: DC
  Administered 2014-04-13 – 2014-04-14 (×3): 100 mg via ORAL
  Filled 2014-04-13 (×3): qty 1

## 2014-04-13 MED ORDER — BISACODYL 10 MG RE SUPP
10.0000 mg | Freq: Once | RECTAL | Status: DC
  Filled 2014-04-13: qty 1

## 2014-04-13 MED ORDER — LORAZEPAM 0.5 MG PO TABS
0.5000 mg | ORAL_TABLET | Freq: Every evening | ORAL | Status: DC | PRN
  Administered 2014-04-15 – 2014-04-17 (×2): 0.5 mg via ORAL
  Filled 2014-04-13 (×3): qty 1

## 2014-04-13 MED ORDER — SACCHAROMYCES BOULARDII 250 MG PO CAPS
250.0000 mg | ORAL_CAPSULE | Freq: Two times a day (BID) | ORAL | Status: DC
  Administered 2014-04-13 – 2014-04-18 (×11): 250 mg via ORAL
  Filled 2014-04-13 (×16): qty 1

## 2014-04-13 MED ORDER — POLYETHYLENE GLYCOL 3350 17 G PO PACK
17.0000 g | PACK | Freq: Every day | ORAL | Status: DC
  Administered 2014-04-13 – 2014-04-14 (×2): 17 g via ORAL
  Filled 2014-04-13 (×2): qty 1

## 2014-04-13 MED ORDER — SENNA 8.6 MG PO TABS
1.0000 | ORAL_TABLET | Freq: Every day | ORAL | Status: DC
  Administered 2014-04-13 – 2014-04-14 (×2): 8.6 mg via ORAL
  Filled 2014-04-13 (×2): qty 1

## 2014-04-13 NOTE — Progress Notes (Signed)
Subjective: Has generalized abdominal pain. No bowel movement for several days. Shortness of breath improving, but not at baseline.  Objective: Vital signs in last 24 hours: Temp:  [98.5 F (36.9 C)-99 F (37.2 C)] 98.5 F (36.9 C) (02/13 0555) Pulse Rate:  [87] 87 (02/13 0555) Resp:  [20] 20 (02/13 0555) BP: (153-183)/(75-87) 163/75 mmHg (02/13 0555) SpO2:  [96 %-99 %] 98 % (02/13 0555) Weight change:    PE: GEN:  Overweight, tachypneic at rest, chronically ill-appearing but is in no acute distress, Cushingoid facies SKIN:  Scattered ecchymoses, skin fragility ABD:  Mild distended, mild generalized tenderness without peritonitis, present but hypoactive bowel sounds   Lab Results: CBC    Component Value Date/Time   WBC 7.8 04/13/2014 0531   WBC 8.9 08/01/2013 1057   RBC 3.93 04/13/2014 0531   RBC 3.38* 04/10/2014 0900   RBC 4.58 08/01/2013 1057   HGB 9.2* 04/13/2014 0531   HGB 12.6 08/01/2013 1057   HCT 31.7* 04/13/2014 0531   HCT 39.1 08/01/2013 1057   PLT 263 04/13/2014 0531   PLT 331 08/01/2013 1057   MCV 80.7 04/13/2014 0531   MCV 85.5 08/01/2013 1057   MCH 23.4* 04/13/2014 0531   MCH 27.5 08/01/2013 1057   MCHC 29.0* 04/13/2014 0531   MCHC 32.1 08/01/2013 1057   RDW 18.2* 04/13/2014 0531   RDW 16.1* 08/01/2013 1057   LYMPHSABS 2.6 04/09/2014 2127   LYMPHSABS 2.1 08/01/2013 1057   MONOABS 1.8* 04/09/2014 2127   MONOABS 0.5 08/01/2013 1057   EOSABS 0.1 04/09/2014 2127   EOSABS 0.0 08/01/2013 1057   BASOSABS 0.0 04/09/2014 2127   BASOSABS 0.1 08/01/2013 1057   CMP     Component Value Date/Time   NA 139 04/13/2014 0531   NA 143 08/01/2013 1057   K 3.8 04/13/2014 0531   K 3.3* 08/01/2013 1057   CL 104 04/13/2014 0531   CL 102 07/04/2012 1456   CO2 28 04/13/2014 0531   CO2 19* 08/01/2013 1057   GLUCOSE 106* 04/13/2014 0531   GLUCOSE 106 08/01/2013 1057   GLUCOSE 92 07/04/2012 1456   BUN 16 04/13/2014 0531   BUN 14.7 08/01/2013 1057   CREATININE  0.99 04/13/2014 0531   CREATININE 0.8 08/01/2013 1057   CALCIUM 8.7 04/13/2014 0531   CALCIUM 9.1 08/01/2013 1057   PROT 6.1 04/13/2014 0531   PROT 6.9 08/01/2013 1057   ALBUMIN 2.6* 04/13/2014 0531   ALBUMIN 2.9* 08/01/2013 1057   AST 21 04/13/2014 0531   AST 12 08/01/2013 1057   ALT 20 04/13/2014 0531   ALT 9 08/01/2013 1057   ALKPHOS 78 04/13/2014 0531   ALKPHOS 111 08/01/2013 1057   BILITOT 0.5 04/13/2014 0531   BILITOT 0.34 08/01/2013 1057   GFRNONAA 54* 04/13/2014 0531   GFRAA 62* 04/13/2014 0531   Assessment:  1.  Shortness of breath.  Possible pneumonia on CT scan; no evidence of pulmonary embolism. 2.  Anemia, new onset.  Possible dark stools, unclear if GI tract mediated. 3.  Abdominal pain, chronic, with some constipation.  Plan:  1.  Abdominal xray today to assess fecal burden; might start some laxatives/enemas based on result. 2.  Supportive management for her shortness of breath and anemia. 3.  Consider endoscopy and colonoscopy early next week, pending evolution of her respiratory status.  Will need propofol for sedation. 4.  Will follow.   Landry Dyke 04/13/2014, 1:08 PM

## 2014-04-13 NOTE — Progress Notes (Signed)
Subjective: Pain in abd.   No sob. No fever.  She did eat today.  Objective: Vital signs in last 24 hours: Temp:  [98.5 F (36.9 C)-99 F (37.2 C)] 98.5 F (36.9 C) (02/13 0555) Pulse Rate:  [87] 87 (02/13 0555) Resp:  [20] 20 (02/13 0555) BP: (155-183)/(75-87) 163/75 mmHg (02/13 0555) SpO2:  [98 %-99 %] 98 % (02/13 0555) Weight change:     Intake/Output from previous day: 02/12 0701 - 02/13 0700 In: 1147.5 [I.V.:1047.5; IV Piggyback:100] Out: 750 [Urine:750] Intake/Output this shift: Total I/O In: -  Out: 400 [Urine:400]  General appearance: alert, cooperative, appears stated age and no distress Resp: clear to auscultation bilaterally Cardio: regular rate and rhythm, S1, S2 normal, no murmur, click, rub or gallop GI: soft, not tender. no mass , no hsm. Extremities: extremities normal, atraumatic, no cyanosis or edema Neurologic: Grossly normal   Lab Results:  Recent Labs  04/12/14 0805 04/13/14 0531  WBC 8.4 7.8  HGB 9.2* 9.2*  HCT 31.3* 31.7*  PLT 228 263   BMET  Recent Labs  04/12/14 0805 04/13/14 0531  NA 135 139  K 3.7 3.8  CL 105 104  CO2 25 28  GLUCOSE 94 106*  BUN 14 16  CREATININE 0.85 0.99  CALCIUM 7.9* 8.7   CMET CMP     Component Value Date/Time   NA 139 04/13/2014 0531   NA 143 08/01/2013 1057   K 3.8 04/13/2014 0531   K 3.3* 08/01/2013 1057   CL 104 04/13/2014 0531   CL 102 07/04/2012 1456   CO2 28 04/13/2014 0531   CO2 19* 08/01/2013 1057   GLUCOSE 106* 04/13/2014 0531   GLUCOSE 106 08/01/2013 1057   GLUCOSE 92 07/04/2012 1456   BUN 16 04/13/2014 0531   BUN 14.7 08/01/2013 1057   CREATININE 0.99 04/13/2014 0531   CREATININE 0.8 08/01/2013 1057   CALCIUM 8.7 04/13/2014 0531   CALCIUM 9.1 08/01/2013 1057   PROT 6.1 04/13/2014 0531   PROT 6.9 08/01/2013 1057   ALBUMIN 2.6* 04/13/2014 0531   ALBUMIN 2.9* 08/01/2013 1057   AST 21 04/13/2014 0531   AST 12 08/01/2013 1057   ALT 20 04/13/2014 0531   ALT 9 08/01/2013 1057   ALKPHOS 78 04/13/2014 0531   ALKPHOS 111 08/01/2013 1057   BILITOT 0.5 04/13/2014 0531   BILITOT 0.34 08/01/2013 1057   GFRNONAA 54* 04/13/2014 0531   GFRAA 62* 04/13/2014 0531     Studies/Results: Dg Chest 2 View  04/12/2014   CLINICAL DATA:  Community acquired bacterial pneumonia.  EXAM: CHEST  2 VIEW  COMPARISON:  April 09, 2014.  FINDINGS: Stable cardiomediastinal silhouette. Small hiatal hernia is noted. No pneumothorax is noted. Surgical clips are noted in left axillary region. Mild bilateral pleural effusions are noted. Elevated right hemidiaphragm is noted. No acute pulmonary disease is noted. Stable old vertebral body compression fracture is seen in lower thoracic spine.  IMPRESSION: Mild bilateral pleural effusions.  Small hiatal hernia.   Electronically Signed   By: Marijo Conception, M.D.   On: 04/12/2014 13:57    Medications: I have reviewed the patient's current medications. Marland Kitchen aspirin EC  81 mg Oral Daily  . DULoxetine  30 mg Oral Daily  . gabapentin  300 mg Oral BID  . heparin  5,000 Units Subcutaneous 3 times per day  . hyoscyamine  0.375 mg Oral Daily  . Influenza vac split quadrivalent PF  0.5 mL Intramuscular Tomorrow-1000  . iron polysaccharides  150  mg Oral Q lunch  . levETIRAcetam  500 mg Oral BID  . levofloxacin  500 mg Intravenous QHS  . levothyroxine  88 mcg Oral QAC breakfast  . pantoprazole  40 mg Oral Daily  . predniSONE  10 mg Oral Q breakfast     Assessment/Plan:  Principal Problem:   CAP (community acquired pneumonia)continue levaquin. Add probiotic. Active Problems:   Obstructive sleep apnea  On no rx now.   Breast cancer, stage 2 Right  No evidence of disease   Seasonal allergies  follow   Seizures  Cont. meds.   Irritable bowel syndrome  She is constipated. No bm in 5 to 6 days.  Add a bowel regimen.  This is not Normal for her.  GI involved for the iron def. Anemia.  May get diagnostic studies next week.   Peripheral neuropathy  Stable.    UGIB (upper gastrointestinal bleed)  Unclear but hgb stable after transfusion of 2uprbc   DVT, LLE  On asa   And on sq heparin   Depression  On cymbalta   Hypothyroidism  Cont. Treatment   Sepsis  None now.   Blood poisoning  Not now.   LOS: 3 days   Jerlyn Ly, MD 04/13/2014, 2:04 PM

## 2014-04-14 LAB — OCCULT BLOOD X 1 CARD TO LAB, STOOL: Fecal Occult Bld: NEGATIVE

## 2014-04-14 MED ORDER — HYDRALAZINE HCL 20 MG/ML IJ SOLN
10.0000 mg | INTRAMUSCULAR | Status: DC | PRN
  Administered 2014-04-14: 10 mg via INTRAVENOUS
  Filled 2014-04-14: qty 1

## 2014-04-14 MED ORDER — ONDANSETRON HCL 4 MG PO TABS
4.0000 mg | ORAL_TABLET | Freq: Four times a day (QID) | ORAL | Status: DC | PRN
  Administered 2014-04-14 – 2014-04-16 (×2): 4 mg via ORAL
  Filled 2014-04-14 (×2): qty 1

## 2014-04-14 MED ORDER — LEVOFLOXACIN 500 MG PO TABS
500.0000 mg | ORAL_TABLET | Freq: Every day | ORAL | Status: DC
  Administered 2014-04-14 – 2014-04-17 (×4): 500 mg via ORAL
  Filled 2014-04-14 (×4): qty 1

## 2014-04-14 MED ORDER — PEG 3350-KCL-NA BICARB-NACL 420 G PO SOLR
4000.0000 mL | Freq: Once | ORAL | Status: AC
  Administered 2014-04-14: 4000 mL via ORAL

## 2014-04-14 NOTE — Progress Notes (Addendum)
Subjective: She feels better after two bm's.  Objective: Vital signs in last 24 hours: Temp:  [98 F (36.7 C)-98.1 F (36.7 C)] 98.1 F (36.7 C) (02/14 0619) Pulse Rate:  [84-85] 85 (02/14 0619) Resp:  [20] 20 (02/14 0619) BP: (121-161)/(73-76) 149/75 mmHg (02/14 0619) SpO2:  [92 %-98 %] 92 % (02/14 0619) Weight change:  Last BM Date: 04/13/14  Intake/Output from previous day: 02/13 0701 - 02/14 0700 In: 570 [P.O.:120; I.V.:350; IV Piggyback:100] Out: 400 [Urine:400] Intake/Output this shift:    General appearance: alert, cooperative and appears stated age Resp: clear to auscultation bilaterally Cardio: regular rate and rhythm, S1, S2 normal, no murmur, click, rub or gallop GI: soft, non-tender; bowel sounds normal; no masses,  no organomegaly Extremities: extremities normal, atraumatic, no cyanosis or edema Neurologic: Grossly normal   Lab Results:  Recent Labs  04/12/14 0805 04/13/14 0531  WBC 8.4 7.8  HGB 9.2* 9.2*  HCT 31.3* 31.7*  PLT 228 263   BMET  Recent Labs  04/12/14 0805 04/13/14 0531  NA 135 139  K 3.7 3.8  CL 105 104  CO2 25 28  GLUCOSE 94 106*  BUN 14 16  CREATININE 0.85 0.99  CALCIUM 7.9* 8.7   CMET CMP     Component Value Date/Time   NA 139 04/13/2014 0531   NA 143 08/01/2013 1057   K 3.8 04/13/2014 0531   K 3.3* 08/01/2013 1057   CL 104 04/13/2014 0531   CL 102 07/04/2012 1456   CO2 28 04/13/2014 0531   CO2 19* 08/01/2013 1057   GLUCOSE 106* 04/13/2014 0531   GLUCOSE 106 08/01/2013 1057   GLUCOSE 92 07/04/2012 1456   BUN 16 04/13/2014 0531   BUN 14.7 08/01/2013 1057   CREATININE 0.99 04/13/2014 0531   CREATININE 0.8 08/01/2013 1057   CALCIUM 8.7 04/13/2014 0531   CALCIUM 9.1 08/01/2013 1057   PROT 6.1 04/13/2014 0531   PROT 6.9 08/01/2013 1057   ALBUMIN 2.6* 04/13/2014 0531   ALBUMIN 2.9* 08/01/2013 1057   AST 21 04/13/2014 0531   AST 12 08/01/2013 1057   ALT 20 04/13/2014 0531   ALT 9 08/01/2013 1057   ALKPHOS 78  04/13/2014 0531   ALKPHOS 111 08/01/2013 1057   BILITOT 0.5 04/13/2014 0531   BILITOT 0.34 08/01/2013 1057   GFRNONAA 54* 04/13/2014 0531   GFRAA 62* 04/13/2014 0531     Studies/Results: Dg Chest 2 View  04/12/2014   CLINICAL DATA:  Community acquired bacterial pneumonia.  EXAM: CHEST  2 VIEW  COMPARISON:  April 09, 2014.  FINDINGS: Stable cardiomediastinal silhouette. Small hiatal hernia is noted. No pneumothorax is noted. Surgical clips are noted in left axillary region. Mild bilateral pleural effusions are noted. Elevated right hemidiaphragm is noted. No acute pulmonary disease is noted. Stable old vertebral body compression fracture is seen in lower thoracic spine.  IMPRESSION: Mild bilateral pleural effusions.  Small hiatal hernia.   Electronically Signed   By: Marijo Conception, M.D.   On: 04/12/2014 13:57   Dg Abd 2 Views  04/13/2014   CLINICAL DATA:  80 year old female with abdominal pain, distention and constipation.  EXAM: ABDOMEN - 2 VIEW  COMPARISON:  Prior lumbar spine radiographs including the abdomen and pelvis 10/30/2012  FINDINGS: No evidence of free air on the lateral decubitus view. The bowel gas pattern is not obstructed. Moderate volume stool present within the rectum and colon. No large rectal stool ball. The bones are diffusely osteopenic. Multiple lumbar spine compression  fractures. L2 and L3 are status post augmentation. There is been some interval progression of height loss at L4. Incompletely imaged ORIF of the proximal left femur. No acute osseous abnormality identified.  IMPRESSION: 1. Moderate volume of retained stool consistent with the clinical history of constipation. No large rectal stool ball. 2. No evidence of bowel obstruction or free air. 3. Slight interval progression of height loss at L4. Otherwise, multifocal compression fractures remain unchanged. 4. Profound osteopenia.   Electronically Signed   By: Jacqulynn Cadet M.D.   On: 04/13/2014 14:49     Medications: I have reviewed the patient's current medications.  Marland Kitchen aspirin EC  81 mg Oral Daily  . DULoxetine  30 mg Oral Daily  . gabapentin  300 mg Oral BID  . heparin  5,000 Units Subcutaneous 3 times per day  . hyoscyamine  0.375 mg Oral Daily  . Influenza vac split quadrivalent PF  0.5 mL Intramuscular Tomorrow-1000  . levETIRAcetam  500 mg Oral BID  . levofloxacin  500 mg Intravenous QHS  . levothyroxine  88 mcg Oral QAC breakfast  . pantoprazole  40 mg Oral Daily  . polyethylene glycol-electrolytes  4,000 mL Oral Once  . predniSONE  10 mg Oral Q breakfast  . saccharomyces boulardii  250 mg Oral BID     Assessment/Plan:  Principal Problem:   CAP (community acquired pneumonia)improved. Finish a course of levaquin. On day 5. Active Problems:   Obstructive sleep apnea  No rx now.   Breast cancer, stage 2 Right   nedz   Seasonal allergies  follow   Seizures  controlled   Irritable bowel syndrome   Now constipated. Continue to treat this   Peripheral neuropathy  From chemo  follow   UGIB (upper gastrointestinal bleed)  For endoscopy soon it seems   DVT, LLE   Cont. Prophylactic dosing for now   Depression   On meds   Hypothyroidism  On medicine   Sepsis  not   Blood poisoning  Not Anemia  Recheck in a.m. Adrenal insufficiency  On higher dose steroids now. Reduce per pcp.   LOS: 4 days   Jerlyn Ly, MD 04/14/2014, 11:29 AM

## 2014-04-14 NOTE — Progress Notes (Signed)
Subjective: Shortness of breath at baseline. Has had a couple bowel movements, pain has improved.  Objective: Vital signs in last 24 hours: Temp:  [98 F (36.7 C)-98.1 F (36.7 C)] 98.1 F (36.7 C) (02/14 0619) Pulse Rate:  [84-85] 85 (02/14 0619) Resp:  [20] 20 (02/14 0619) BP: (121-161)/(73-76) 149/75 mmHg (02/14 0619) SpO2:  [92 %-98 %] 92 % (02/14 0619) Weight change:  Last BM Date: 04/13/14  PE: GEN:  Chronically ill-appearing, but is in NAD ABD:  Soft, mild distended, active bowel sounds, no peritonitis  Lab Results: CBC    Component Value Date/Time   WBC 7.8 04/13/2014 0531   WBC 8.9 08/01/2013 1057   RBC 3.93 04/13/2014 0531   RBC 3.38* 04/10/2014 0900   RBC 4.58 08/01/2013 1057   HGB 9.2* 04/13/2014 0531   HGB 12.6 08/01/2013 1057   HCT 31.7* 04/13/2014 0531   HCT 39.1 08/01/2013 1057   PLT 263 04/13/2014 0531   PLT 331 08/01/2013 1057   MCV 80.7 04/13/2014 0531   MCV 85.5 08/01/2013 1057   MCH 23.4* 04/13/2014 0531   MCH 27.5 08/01/2013 1057   MCHC 29.0* 04/13/2014 0531   MCHC 32.1 08/01/2013 1057   RDW 18.2* 04/13/2014 0531   RDW 16.1* 08/01/2013 1057   LYMPHSABS 2.6 04/09/2014 2127   LYMPHSABS 2.1 08/01/2013 1057   MONOABS 1.8* 04/09/2014 2127   MONOABS 0.5 08/01/2013 1057   EOSABS 0.1 04/09/2014 2127   EOSABS 0.0 08/01/2013 1057   BASOSABS 0.0 04/09/2014 2127   BASOSABS 0.1 08/01/2013 1057   CMP     Component Value Date/Time   NA 139 04/13/2014 0531   NA 143 08/01/2013 1057   K 3.8 04/13/2014 0531   K 3.3* 08/01/2013 1057   CL 104 04/13/2014 0531   CL 102 07/04/2012 1456   CO2 28 04/13/2014 0531   CO2 19* 08/01/2013 1057   GLUCOSE 106* 04/13/2014 0531   GLUCOSE 106 08/01/2013 1057   GLUCOSE 92 07/04/2012 1456   BUN 16 04/13/2014 0531   BUN 14.7 08/01/2013 1057   CREATININE 0.99 04/13/2014 0531   CREATININE 0.8 08/01/2013 1057   CALCIUM 8.7 04/13/2014 0531   CALCIUM 9.1 08/01/2013 1057   PROT 6.1 04/13/2014 0531   PROT 6.9  08/01/2013 1057   ALBUMIN 2.6* 04/13/2014 0531   ALBUMIN 2.9* 08/01/2013 1057   AST 21 04/13/2014 0531   AST 12 08/01/2013 1057   ALT 20 04/13/2014 0531   ALT 9 08/01/2013 1057   ALKPHOS 78 04/13/2014 0531   ALKPHOS 111 08/01/2013 1057   BILITOT 0.5 04/13/2014 0531   BILITOT 0.34 08/01/2013 1057   GFRNONAA 54* 04/13/2014 0531   GFRAA 62* 04/13/2014 0531   Studies/Results: Dg Chest 2 View  04/12/2014   CLINICAL DATA:  Community acquired bacterial pneumonia.  EXAM: CHEST  2 VIEW  COMPARISON:  April 09, 2014.  FINDINGS: Stable cardiomediastinal silhouette. Small hiatal hernia is noted. No pneumothorax is noted. Surgical clips are noted in left axillary region. Mild bilateral pleural effusions are noted. Elevated right hemidiaphragm is noted. No acute pulmonary disease is noted. Stable old vertebral body compression fracture is seen in lower thoracic spine.  IMPRESSION: Mild bilateral pleural effusions.  Small hiatal hernia.   Electronically Signed   By: Marijo Conception, M.D.   On: 04/12/2014 13:57   Dg Abd 2 Views  04/13/2014   CLINICAL DATA:  80 year old female with abdominal pain, distention and constipation.  EXAM: ABDOMEN - 2 VIEW  COMPARISON:  Prior lumbar spine radiographs including the abdomen and pelvis 10/30/2012  FINDINGS: No evidence of free air on the lateral decubitus view. The bowel gas pattern is not obstructed. Moderate volume stool present within the rectum and colon. No large rectal stool ball. The bones are diffusely osteopenic. Multiple lumbar spine compression fractures. L2 and L3 are status post augmentation. There is been some interval progression of height loss at L4. Incompletely imaged ORIF of the proximal left femur. No acute osseous abnormality identified.  IMPRESSION: 1. Moderate volume of retained stool consistent with the clinical history of constipation. No large rectal stool ball. 2. No evidence of bowel obstruction or free air. 3. Slight interval progression of  height loss at L4. Otherwise, multifocal compression fractures remain unchanged. 4. Profound osteopenia.   Electronically Signed   By: Jacqulynn Cadet M.D.   On: 04/13/2014 14:49   Assessment:  1.  Abdominal pain.  Possibly constipation-related. 2.  Constipation, confirmed on abdominal xray. 3.  Anemia, new onset, with iron-deficiency. 4.  Dark stools.  Plan:  1.  Patient's respiratory status has improved and I feel she's ok to proceed with endoscopy and colonoscopy.  However, both patient and I think she is significantly constipated and, as such, will need protracted 2-day bowel prep. 2.  Will do full liquids today, clear liquids tomorrow. 3.  Start slow bowel prep today. Anticipation for endoscopy and colonoscopy with propofol on Tuesday, pending anesthesia availability. 4.  Will follow.   Landry Dyke 04/14/2014, 10:55 AM

## 2014-04-15 LAB — COMPREHENSIVE METABOLIC PANEL
ALT: 35 U/L (ref 0–35)
AST: 29 U/L (ref 0–37)
Albumin: 2.6 g/dL — ABNORMAL LOW (ref 3.5–5.2)
Alkaline Phosphatase: 84 U/L (ref 39–117)
Anion gap: 4 — ABNORMAL LOW (ref 5–15)
BUN: 14 mg/dL (ref 6–23)
CO2: 33 mmol/L — AB (ref 19–32)
Calcium: 8.4 mg/dL (ref 8.4–10.5)
Chloride: 104 mmol/L (ref 96–112)
Creatinine, Ser: 0.9 mg/dL (ref 0.50–1.10)
GFR, EST AFRICAN AMERICAN: 70 mL/min — AB (ref >90)
GFR, EST NON AFRICAN AMERICAN: 60 mL/min — AB (ref >90)
GLUCOSE: 92 mg/dL (ref 70–99)
POTASSIUM: 4.3 mmol/L (ref 3.5–5.1)
Sodium: 141 mmol/L (ref 135–145)
TOTAL PROTEIN: 5.8 g/dL — AB (ref 6.0–8.3)
Total Bilirubin: 0.4 mg/dL (ref 0.3–1.2)

## 2014-04-15 LAB — CBC WITH DIFFERENTIAL/PLATELET
BASOS PCT: 0 % (ref 0–1)
Basophils Absolute: 0 10*3/uL (ref 0.0–0.1)
Eosinophils Absolute: 0.2 10*3/uL (ref 0.0–0.7)
Eosinophils Relative: 3 % (ref 0–5)
HEMATOCRIT: 36.1 % (ref 36.0–46.0)
Hemoglobin: 10.3 g/dL — ABNORMAL LOW (ref 12.0–15.0)
LYMPHS ABS: 1.9 10*3/uL (ref 0.7–4.0)
Lymphocytes Relative: 28 % (ref 12–46)
MCH: 23.3 pg — ABNORMAL LOW (ref 26.0–34.0)
MCHC: 28.5 g/dL — ABNORMAL LOW (ref 30.0–36.0)
MCV: 81.7 fL (ref 78.0–100.0)
Monocytes Absolute: 0.7 10*3/uL (ref 0.1–1.0)
Monocytes Relative: 11 % (ref 3–12)
Neutro Abs: 3.9 10*3/uL (ref 1.7–7.7)
Neutrophils Relative %: 58 % (ref 43–77)
PLATELETS: 258 10*3/uL (ref 150–400)
RBC: 4.42 MIL/uL (ref 3.87–5.11)
RDW: 19.6 % — AB (ref 11.5–15.5)
WBC: 6.7 10*3/uL (ref 4.0–10.5)

## 2014-04-15 MED ORDER — BISACODYL 5 MG PO TBEC
5.0000 mg | DELAYED_RELEASE_TABLET | Freq: Once | ORAL | Status: AC
  Administered 2014-04-15: 5 mg via ORAL
  Filled 2014-04-15: qty 1

## 2014-04-15 MED ORDER — PEG 3350-KCL-NA BICARB-NACL 420 G PO SOLR
2000.0000 mL | ORAL | Status: AC
  Administered 2014-04-15 – 2014-04-16 (×2): 2000 mL via ORAL

## 2014-04-15 NOTE — Progress Notes (Signed)
Subjective: Abdominal pain improving.   Only took one-half of Golytely. Respiratory status baseline.  Objective: Vital signs in last 24 hours: Temp:  [97.6 F (36.4 C)-98.6 F (37 C)] 98.6 F (37 C) (02/15 0540) Pulse Rate:  [61-97] 61 (02/15 0540) Resp:  [20] 20 (02/15 0540) BP: (96-197)/(50-103) 96/51 mmHg (02/15 0540) SpO2:  [97 %-98 %] 98 % (02/15 0540) Weight change:  Last BM Date: 04/14/14  PE: GEN:  Chronically ill-appearing, NAD ABD:  Soft  Lab Results: CBC    Component Value Date/Time   WBC 6.7 04/15/2014 0450   WBC 8.9 08/01/2013 1057   RBC 4.42 04/15/2014 0450   RBC 3.38* 04/10/2014 0900   RBC 4.58 08/01/2013 1057   HGB 10.3* 04/15/2014 0450   HGB 12.6 08/01/2013 1057   HCT 36.1 04/15/2014 0450   HCT 39.1 08/01/2013 1057   PLT 258 04/15/2014 0450   PLT 331 08/01/2013 1057   MCV 81.7 04/15/2014 0450   MCV 85.5 08/01/2013 1057   MCH 23.3* 04/15/2014 0450   MCH 27.5 08/01/2013 1057   MCHC 28.5* 04/15/2014 0450   MCHC 32.1 08/01/2013 1057   RDW 19.6* 04/15/2014 0450   RDW 16.1* 08/01/2013 1057   LYMPHSABS 1.9 04/15/2014 0450   LYMPHSABS 2.1 08/01/2013 1057   MONOABS 0.7 04/15/2014 0450   MONOABS 0.5 08/01/2013 1057   EOSABS 0.2 04/15/2014 0450   EOSABS 0.0 08/01/2013 1057   BASOSABS 0.0 04/15/2014 0450   BASOSABS 0.1 08/01/2013 1057   CMP     Component Value Date/Time   NA 141 04/15/2014 0450   NA 143 08/01/2013 1057   K 4.3 04/15/2014 0450   K 3.3* 08/01/2013 1057   CL 104 04/15/2014 0450   CL 102 07/04/2012 1456   CO2 33* 04/15/2014 0450   CO2 19* 08/01/2013 1057   GLUCOSE 92 04/15/2014 0450   GLUCOSE 106 08/01/2013 1057   GLUCOSE 92 07/04/2012 1456   BUN 14 04/15/2014 0450   BUN 14.7 08/01/2013 1057   CREATININE 0.90 04/15/2014 0450   CREATININE 0.8 08/01/2013 1057   CALCIUM 8.4 04/15/2014 0450   CALCIUM 9.1 08/01/2013 1057   PROT 5.8* 04/15/2014 0450   PROT 6.9 08/01/2013 1057   ALBUMIN 2.6* 04/15/2014 0450   ALBUMIN 2.9*  08/01/2013 1057   AST 29 04/15/2014 0450   AST 12 08/01/2013 1057   ALT 35 04/15/2014 0450   ALT 9 08/01/2013 1057   ALKPHOS 84 04/15/2014 0450   ALKPHOS 111 08/01/2013 1057   BILITOT 0.4 04/15/2014 0450   BILITOT 0.34 08/01/2013 1057   GFRNONAA 60* 04/15/2014 0450   GFRAA 70* 04/15/2014 0450   Assessment:  1.  Iron-deficiency anemia. 2.  Abdominal pain, ? Constipation-related, improving. 3.  Constipation.  Plan:  1.  Endoscopy and colonoscopy tomorrow, if she can tolerate rest of prep. 2.  Will follow.   Landry Dyke 04/15/2014, 9:06 AM

## 2014-04-15 NOTE — Progress Notes (Signed)
PT Cancellation Note  Patient Details Name: [name] MRN: [id number] DOB: 06/30/1934   Cancelled Treatment:    Reason Eval/Treat Not Completed: Other (comment) (pt on bed pan, drinking golytely in prep for  procedure tomorrow; will attempt later this wk)   [facility] 04/15/2014, 3:15 PM

## 2014-04-15 NOTE — Progress Notes (Signed)
Subjective: Still has pain around back and ribs. Taking bowel prep fairly well. resp status is improving   Objective: Vital signs in last 24 hours: Temp:  [97.6 F (36.4 C)-98.6 F (37 C)] 98.6 F (37 C) (02/15 0540) Pulse Rate:  [61-97] 61 (02/15 0540) Resp:  [20] 20 (02/15 0540) BP: (96-197)/(50-103) 96/51 mmHg (02/15 0540) SpO2:  [97 %-98 %] 98 % (02/15 0540)  Intake/Output from previous day: 02/14 0701 - 02/15 0700 In: 1370 [P.O.:960; I.V.:410] Out: -  Intake/Output this shift:    Lying on right side in no distress. Round face. Some scattered rhonchi. Ht regular abd min distended good BS's, awake, mentating OK  Lab Results   Recent Labs  05-May-2014 0531 04/15/14 0450  WBC 7.8 6.7  RBC 3.93 4.42  HGB 9.2* 10.3*  HCT 31.7* 36.1  MCV 80.7 81.7  MCH 23.4* 23.3*  RDW 18.2* 19.6*  PLT 263 258    Recent Labs  05-05-2014 0531 04/15/14 0450  NA 139 141  K 3.8 4.3  CL 104 104  CO2 28 33*  GLUCOSE 106* 92  BUN 16 14  CREATININE 0.99 0.90  CALCIUM 8.7 8.4    Studies/Results: Dg Abd 2 Views  May 05, 2014   CLINICAL DATA:  80 year old female with abdominal pain, distention and constipation.  EXAM: ABDOMEN - 2 VIEW  COMPARISON:  Prior lumbar spine radiographs including the abdomen and pelvis 10/30/2012  FINDINGS: No evidence of free air on the lateral decubitus view. The bowel gas pattern is not obstructed. Moderate volume stool present within the rectum and colon. No large rectal stool ball. The bones are diffusely osteopenic. Multiple lumbar spine compression fractures. L2 and L3 are status post augmentation. There is been some interval progression of height loss at L4. Incompletely imaged ORIF of the proximal left femur. No acute osseous abnormality identified.  IMPRESSION: 1. Moderate volume of retained stool consistent with the clinical history of constipation. No large rectal stool ball. 2. No evidence of bowel obstruction or free air. 3. Slight interval progression of  height loss at L4. Otherwise, multifocal compression fractures remain unchanged. 4. Profound osteopenia.   Electronically Signed   By: Jacqulynn Cadet M.D.   On: 2014/05/05 14:49    Scheduled Meds: . aspirin EC  81 mg Oral Daily  . DULoxetine  30 mg Oral Daily  . gabapentin  300 mg Oral BID  . heparin  5,000 Units Subcutaneous 3 times per day  . hyoscyamine  0.375 mg Oral Daily  . Influenza vac split quadrivalent PF  0.5 mL Intramuscular Tomorrow-1000  . levETIRAcetam  500 mg Oral BID  . levofloxacin  500 mg Oral QHS  . levothyroxine  88 mcg Oral QAC breakfast  . pantoprazole  40 mg Oral Daily  . predniSONE  10 mg Oral Q breakfast  . saccharomyces boulardii  250 mg Oral BID   Continuous Infusions: . sodium chloride 10 mL/hr at 04/14/14 2247   PRN Meds:fentaNYL, hydrALAZINE, LORazepam, LORazepam, ondansetron, oxyCODONE-acetaminophen  Assessment/Plan:  PNEUMONIA: finishing levaquin. No fever IRON DEFICIENT ANEMIA, SYMPTOMATIC: up over 10, to have investigations tomorrow ADRENAL INSUFFIC: needs to be on stress steroids, now on 10 mg HYPOTHYROID: on Rx DEPRESSION: has been profound, better back on cymbalta GERD; on Rx PROTEIN CALORIE MALNUTRITION: has been increasingly an issue IMPAIRED FASTING GLUCOSE: minor elevations OSTEOPOROSIS WITH FRACTURES: check vit D BREAST CANCER: since 2011, NED HX PULMONARY EMBOLUS: on heparin now NEUROPATHY FROM CHEMO; restart neurontin SEIZURE DISORDER: on Rx   LOS: 5  days   Yoland Scherr [name] 04/15/2014, 9:00 AM

## 2014-04-16 ENCOUNTER — Encounter (HOSPITAL_COMMUNITY): Payer: Self-pay | Admitting: *Deleted

## 2014-04-16 ENCOUNTER — Inpatient Hospital Stay (HOSPITAL_COMMUNITY): Payer: Medicare Other | Admitting: Anesthesiology

## 2014-04-16 ENCOUNTER — Encounter (HOSPITAL_COMMUNITY)
Admission: EM | Disposition: A | Discharge status: Skilled Nursing Facility | Payer: Self-pay | Source: Home / Self Care | Attending: Endocrinology

## 2014-04-16 HISTORY — PX: ESOPHAGOGASTRODUODENOSCOPY (EGD) WITH PROPOFOL: SHX5813

## 2014-04-16 HISTORY — PX: COLONOSCOPY WITH PROPOFOL: SHX5780

## 2014-04-16 LAB — CULTURE, BLOOD (ROUTINE X 2)
Culture: NO GROWTH
Culture: NO GROWTH

## 2014-04-16 SURGERY — ESOPHAGOGASTRODUODENOSCOPY (EGD) WITH PROPOFOL
Anesthesia: Monitor Anesthesia Care | Laterality: Left

## 2014-04-16 MED ORDER — SODIUM CHLORIDE 0.9 % IV SOLN
INTRAVENOUS | Status: DC | PRN
  Administered 2014-04-16: 13:00:00 via INTRAVENOUS

## 2014-04-16 MED ORDER — SODIUM CHLORIDE 0.9 % IV SOLN: INTRAVENOUS | Status: DC

## 2014-04-16 MED ORDER — PROPOFOL INFUSION 10 MG/ML OPTIME
INTRAVENOUS | Status: DC | PRN
  Administered 2014-04-16: 25 ug/kg/min via INTRAVENOUS

## 2014-04-16 NOTE — Anesthesia Preprocedure Evaluation (Addendum)
Anesthesia Evaluation  Patient identified by MRN, date of birth, ID band Patient awake    Reviewed: Allergy & Precautions, NPO status , Patient's Chart, lab work & pertinent test results  History of Anesthesia Complications (+) PONV and history of anesthetic complications  Airway Mallampati: II  TM Distance: >3 FB Neck ROM: Full    Dental  (+) Teeth Intact, Dental Advisory Given   Pulmonary asthma , former smoker,  breath sounds clear to auscultation        Cardiovascular - anginaDVT Rhythm:Regular Rate:Normal  '15 ECHO: EF 55-60%, mild TR, mild AS   Neuro/Psych Seizures -, Well Controlled,  Chronic back pain    GI/Hepatic Neg liver ROS, GERD-  Medicated and Controlled,  Endo/Other  Hypothyroidism   Renal/GU negative Renal ROS     Musculoskeletal  (+) Fibromyalgia -  Abdominal (+) + obese,   Peds  Hematology  (+) Blood dyscrasia (Hb 10.3), ,   Anesthesia Other Findings Breast cancer  Reproductive/Obstetrics                           Anesthesia Physical Anesthesia Plan  ASA: III  Anesthesia Plan: MAC   Post-op Pain Management:    Induction: Intravenous  Airway Management Planned: Natural Airway  Additional Equipment:   Intra-op Plan:   Post-operative Plan:   Informed Consent: I have reviewed the patients History and Physical, chart, labs and discussed the procedure including the risks, benefits and alternatives for the proposed anesthesia with the patient or authorized representative who has indicated his/her understanding and acceptance.   Dental advisory given  Plan Discussed with: CRNA and Surgeon  Anesthesia Plan Comments: (Plan routine monitors, MAC)        Anesthesia Quick Evaluation

## 2014-04-16 NOTE — Op Note (Signed)
[facility] [place], [address]   COLONOSCOPY PROCEDURE REPORT  PATIENT: Kelsey, [name]  MR#: [id number] BIRTHDATE: 24-Feb-1935 , 33  yrs. old GENDER: female ENDOSCOPIST: Laurence Spates, MD REFERRED BY:  Reynold Bowen, M.D. PROCEDURE DATE:  05/13/14 PROCEDURE:   Colonoscopy with biopsy ASA CLASS:   Class III INDICATIONS:iron deficiency anemia in woman who has prior history of microscopic colitis. MEDICATIONS: Monitored anesthesia care and Per Anesthesia  DESCRIPTION OF PROCEDURE:   After the risks and benefits and of the procedure were explained, informed consent was obtained.  revealed no abnormalities of the rectum.    The Pentax Adult Colon [id number] endoscope was introduced through the anus and advanced to the terminal ileum which was intubated for a short distance .  The quality of the prep was good. .  The instrument was then slowly withdrawn as the colon was fully examined.     COLON FINDINGS: The examined terminal ileum appeared to be normal. A normal appearing cecum, ileocecal valve, and appendiceal orifice were identified.  The ascending, transverse, descending, sigmoid colon, and rectum appeared unremarkable.  Multiple biopsies were performed using cold forceps.   A smooth sessile polyp measuring 5 mm in size was found in the descending colon.  A biopsy was performed using cold forceps.  This had the appearance of a lipoma and appeared to be submucosal.  It was quite soft single biopsy was taken.   This  was done to evaluate for microscopic colitis. Retroflexion was not performed.     The scope was then withdrawn from the patient and the procedure completed.  WITHDRAWAL TIME: 11 minutes 0 seconds  COMPLICATIONS: There were no immediate complications. ENDOSCOPIC IMPRESSION: 1.   The examined terminal ileum appeared to be normal 2.   Normal colonoscopy; multiple biopsies were performed using cold forceps 3.   Sessile  polyp was found in the descending colon; biopsy was performed using cold forceps 4.   No active bleeding seen RECOMMENDATIONS: Resume diet and follow-up on the patient clinically.  REPEAT EXAM:  [id number] [place], MD  _______________________________ eSignedLaurence Spates, MD May 13, 2014 2:34 PM   CPT CODES: ICD CODES:  The ICD and CPT codes recommended by this software are interpretations from the data that the clinical staff has captured with the software.  The verification of the translation of this report to the ICD and CPT codes and modifiers is the sole responsibility of the health care institution and practicing physician where this report was generated.  [facility]. will not be held responsible for the validity of the ICD and CPT codes included on this report.  AMA assumes no liability for data contained or not contained herein. CPT is a Designer, television/film set of the [employer].

## 2014-04-16 NOTE — Interval H&P Note (Signed)
History and Physical Interval Note:  04/16/2014 1:10 PM  [place]  has presented today for surgery, with the diagnosis of Dark stools, iron-deficiency anemia, abdominal pain  The various methods of treatment have been discussed with the patient and family. After consideration of risks, benefits and other options for treatment, the patient has consented to  Procedure(s): ESOPHAGOGASTRODUODENOSCOPY (EGD) WITH PROPOFOL (Left) COLONOSCOPY WITH PROPOFOL (Left) as a surgical intervention .  The patient's history has been reviewed, patient examined, no change in status, stable for surgery.  I have reviewed the patient's chart and labs.  Questions were answered to the patient's satisfaction.     Cosette Prindle JR,Kaylei Frink L

## 2014-04-16 NOTE — Progress Notes (Signed)
OT Cancellation Note  Patient Details Name: [name] MRN: [id number] DOB: 1934/05/30   Cancelled Treatment:    Reason Eval/Treat Not Completed: Other (comment). Pt had EGD this pm.  Will try to check back tomorrow.  Issaiah Seabrooks 04/16/2014, 3:14 PM  Lesle Chris, OTR/L [phone] 04/16/2014

## 2014-04-16 NOTE — Transfer of Care (Signed)
Immediate Anesthesia Transfer of Care Note  Patient: [place]  Procedure(s) Performed: Procedure(s): ESOPHAGOGASTRODUODENOSCOPY (EGD) WITH PROPOFOL (Left) COLONOSCOPY WITH PROPOFOL (Left)  Patient Location: PACU  Anesthesia Type:MAC  Level of Consciousness: awake, alert  and patient cooperative  Airway & Oxygen Therapy: Patient Spontanous Breathing and Patient connected to nasal cannula oxygen  Post-op Assessment: Report given to RN and Post -op Vital signs reviewed and stable  Post vital signs: Reviewed and stable  Last Vitals:  Filed Vitals:   04/16/14 1420  BP: 135/106  Pulse: 80  Temp:   Resp: 12    Complications: No apparent anesthesia complications

## 2014-04-16 NOTE — Progress Notes (Signed)
Patient had an 11 beat run of v-tach around 23:50.  Patient denied any chest pain or shortness of breath.  Notified MD on call.  No new orders a this time.  Will continue to monitor patient.  [place], Kanika Bungert Cherie

## 2014-04-16 NOTE — Anesthesia Postprocedure Evaluation (Signed)
  Anesthesia Post-op Note  Patient: [place]  Procedure(s) Performed: Procedure(s): ESOPHAGOGASTRODUODENOSCOPY (EGD) WITH PROPOFOL (Left) COLONOSCOPY WITH PROPOFOL (Left)  Patient Location: Endoscopy Unit  Anesthesia Type:MAC  Level of Consciousness: sedated, patient cooperative and responds to stimulation  Airway and Oxygen Therapy: Patient Spontanous Breathing and Patient connected to nasal cannula oxygen  Post-op Pain: none  Post-op Assessment: Post-op Vital signs reviewed, Patient's Cardiovascular Status Stable, Respiratory Function Stable, Patent Airway, No signs of Nausea or vomiting and Pain level controlled  Post-op Vital Signs: Reviewed and stable  Last Vitals:  Filed Vitals:   04/16/14 1420  BP: 135/106  Pulse: 80  Temp:   Resp: 12    Complications: No apparent anesthesia complications

## 2014-04-16 NOTE — Progress Notes (Signed)
Subjective: Unable to finish oral prep. Has been on liquid diet. For EGD And colon today. Slept fair. Chronic back pain continues. Coughing less   Objective: Vital signs in last 24 hours: Temp:  [98.1 F (36.7 C)-98.6 F (37 C)] 98.1 F (36.7 C) (02/16 0454) Pulse Rate:  [84-96] 91 (02/16 0454) Resp:  [16-18] 16 (02/16 0454) BP: (113-158)/(61-71) 158/66 mmHg (02/16 0454) SpO2:  [90 %-99 %] 97 % (02/16 0454)  Intake/Output from previous day: 02/15 0701 - 02/16 0700 In: 240 [I.V.:240] Out: -  Intake/Output this shift:    Lying flat in no distress. Anicteric. Lungs fairly clear. Ht regular. abd min distention, good BS's. Extensive thin skin with tears and brusing. Awake.mentating well  Lab Results   Recent Labs  04/15/14 0450  WBC 6.7  RBC 4.42  HGB 10.3*  HCT 36.1  MCV 81.7  MCH 23.3*  RDW 19.6*  PLT 258    Recent Labs  04/15/14 0450  NA 141  K 4.3  CL 104  CO2 33*  GLUCOSE 92  BUN 14  CREATININE 0.90  CALCIUM 8.4    Studies/Results: No results found.  Scheduled Meds: . aspirin EC  81 mg Oral Daily  . DULoxetine  30 mg Oral Daily  . gabapentin  300 mg Oral BID  . heparin  5,000 Units Subcutaneous 3 times per day  . hyoscyamine  0.375 mg Oral Daily  . Influenza vac split quadrivalent PF  0.5 mL Intramuscular Tomorrow-1000  . levETIRAcetam  500 mg Oral BID  . levofloxacin  500 mg Oral QHS  . levothyroxine  88 mcg Oral QAC breakfast  . pantoprazole  40 mg Oral Daily  . predniSONE  10 mg Oral Q breakfast  . saccharomyces boulardii  250 mg Oral BID   Continuous Infusions: . sodium chloride 10 mL/hr at 04/14/14 2247   PRN Meds:fentaNYL, hydrALAZINE, LORazepam, LORazepam, ondansetron, oxyCODONE-acetaminophen  Assessment/Plan:   PNEUMONIA: finishing levaquin. 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

## 2014-04-16 NOTE — H&P (View-Only) (Signed)
Subjective:   HPI  The patient is a 80 year old female who was admitted to the hospital 2 days ago with pneumonia. She is feeling better in regards to that. We are asked to see her in regards to iron deficiency anemia. Her lowest hemoglobin here in the hospital was 6.4. The patient states that she has not seen any bleeding. She states however that she will occasionally see some dark colored stool. Her stool when checked here in the hospital was heme negative. She denies abdominal pain hematemesis or hematochezia. She had a GI bleed in January 2015. An EGD showed H. pylori gastritis. She had a pyloric channel ulcer. There was also a hiatal hernia and a duodenal AVM. She had a colonoscopy in December 2013 which was negative except for hemorrhoids. She does have a history of microscopic colitis. She used to be on anticoagulants but not anymore because of a history of GI bleed.  Review of Systems No current complaints of chest pain. A little short of breath  Past Medical History  Diagnosis Date  . IBS (irritable bowel syndrome)   . Fibromyalgia   . History of DVT (deep vein thrombosis)   . History of pulmonary embolism   . Osteoporosis   . History of breast cancer LEFT BREAST DCIS  1996  . Hyperlipidemia   . 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 Breast cancer RIGHT SIDE-- DX OCT 2011    CHEMORADIATION THERAPY-- COMPLETED   . PONV (postoperative nausea and vomiting)   . Skin tear LEFT ARM COVERED W/ TEGADERM    PT STATES HX MULTIPLE SKIN TEARS -- THIN  . Thin skin SECONARDY AGE AND HX CHEMO  . Ecchymosis   . Seasonal allergies   . Frequency of urination   . Nocturia   . Anemia   . Lung mass PER DR CLANCE NOTE UNCLEAR IF LYMPHOMA FROM BX    FOLLOWED BY DR RUBIN  . Seizures   . Unspecified hereditary and idiopathic peripheral neuropathy   . Asthma   . Fibromyalgia   . Vertebral compression fracture 5/14    L4  . CHF (congestive heart failure)     Past Surgical History  Procedure Laterality Date  . Total abdominal hysterectomy w/ bilateral salpingoophorectomy  1977  (APPROX)  . Cholecystectomy    . Appendectomy    . Femur fracture surgery  12-18-2010  DR BEANE    INTRAMEDULLARY NAILING LEFT INTERTROCHANTERIC -SUBTROCHANTERIC FX  . Right breast lumpectomy / removal lymph nodes x2/ removal pac  10-01-2010    CARCINOMA RIGHT BREAST  . Placement port- a- cath  02-18-2010  . Bronchoscopy  01-29-2010    W/ BX  . Tvt vaginal tape  suburethral sling   06-08-2005    SUI  . Bilateral laminectomy/ foraminotomy  01-14-2004    L4 - L5  . Breast lumpectomy  1996     LEFT BREAST DCIS   . Cataract extraction w/ intraocular lens  implant, bilateral    . Bilateral carpal tunnel release    . Tonsillectomy    . Transthoracic echocardiogram  02-25-2010    LVSF NORMAL/ EF 65-03%/ GRADE I DIASTOLIC DYSFUNCTION/ MILDLY DILATED LEFT ATRIUM  . Hardware removal  10/11/2011    Procedure: HARDWARE REMOVAL;  Surgeon: Johnn Hai, MD;  Location: [facility];  Service: Orthopedics;  Laterality: Left;  LEFT THIGH REMOVAL OF DISTAL LOCKING SCREW NEEDS: FLORO, RADIO LUSCENT TABLE AND SCREWDRIVER FOR BIOMET ROD   .  Colonoscopy    . Esophagogastroduodenoscopy N/A 03/11/2013    Procedure: ESOPHAGOGASTRODUODENOSCOPY (EGD);  Surgeon: Lear Ng, MD;  Location: [facility];  Service: Endoscopy;  Laterality: N/A;   History   Social History  . Marital Status: Married    Spouse Name: N/A  . Number of Children: N/A  . Years of Education: N/A   Occupational History  . retired    Social History Main Topics  . Smoking status: Former Smoker -- 1.00 packs/day for 30 years    Types: Cigarettes    Quit date: 03/01/1978  . Smokeless tobacco: Never Used  . Alcohol Use: 4.2 - 6.0 oz/week    7-10 Glasses of wine per week  . Drug Use: No  . Sexual Activity: No   Other Topics Concern  . Not on file   Social History Narrative    family history includes Alzheimer's disease in her mother; Breast cancer in her maternal grandmother; CAD in her father; Cancer in her mother and sister; Emphysema in her father; Hyperlipidemia in her mother; Ovarian cancer in her mother.  Current facility-administered medications:  .  0.9 %  sodium chloride infusion, , Intravenous, Continuous, Sheela Stack, MD, Last Rate: 50 mL/hr at 04/12/14 1157 .  0.9 %  sodium chloride infusion, , Intravenous, Once, Jeryl Columbia, NP .  0.9 %  sodium chloride infusion, , Intravenous, Once, Sheela Stack, MD .  aspirin EC tablet 81 mg, 81 mg, Oral, Daily, Ivor Costa, MD, 81 mg at 04/12/14 1005 .  DULoxetine (CYMBALTA) DR capsule 30 mg, 30 mg, Oral, Daily, Sheela Stack, MD, 30 mg at 04/12/14 1005 .  fentaNYL (SUBLIMAZE) injection 50 mcg, 50 mcg, Intravenous, Q2H PRN, Rhetta Mura Schorr, NP, 50 mcg at 04/11/14 1042 .  gabapentin (NEURONTIN) capsule 300 mg, 300 mg, Oral, BID, Sheela Stack, MD, 300 mg at 04/12/14 1005 .  heparin injection 5,000 Units, 5,000 Units, Subcutaneous, 3 times per day, Ivor Costa, MD, 5,000 Units at 04/12/14 1410 .  hyoscyamine (LEVBID) 0.375 MG 12 hr tablet 0.375 mg, 0.375 mg, Oral, Daily, Ivor Costa, MD, 0.375 mg at 04/12/14 1005 .  Influenza vac split quadrivalent PF (FLUARIX) injection 0.5 mL, 0.5 mL, Intramuscular, Tomorrow-1000, Ivor Costa, MD .  iron polysaccharides (NIFEREX) capsule 150 mg, 150 mg, Oral, Q lunch, Sheela Stack, MD, 150 mg at 04/12/14 1220 .  levETIRAcetam (KEPPRA) tablet 500 mg, 500 mg, Oral, BID, Ivor Costa, MD, 500 mg at 04/12/14 1005 .  levofloxacin (LEVAQUIN) IVPB 500 mg, 500 mg, Intravenous, QHS, Ivor Costa, MD, 500 mg at 04/11/14 2157 .  levothyroxine (SYNTHROID, LEVOTHROID) tablet 88 mcg, 88 mcg, Oral, QAC breakfast, Ivor Costa, MD, 88 mcg at 04/12/14 0727 .  LORazepam (ATIVAN) tablet 0.5 mg, 0.5 mg, Oral, Daily PRN, Ivor Costa, MD .  oxyCODONE-acetaminophen (PERCOCET/ROXICET)  5-325 MG per tablet 1 tablet, 1 tablet, Oral, Q6H PRN, Ivor Costa, MD, 1 tablet at 04/12/14 1523 .  pantoprazole (PROTONIX) EC tablet 40 mg, 40 mg, Oral, Daily, Ivor Costa, MD, 40 mg at 04/12/14 1005 .  polyethylene glycol (MIRALAX / GLYCOLAX) packet 17 g, 17 g, Oral, Daily PRN, Sheela Stack, MD .  Derrill Memo ON 04/13/2014] predniSONE (DELTASONE) tablet 10 mg, 10 mg, Oral, Q breakfast, Sheela Stack, MD  Facility-Administered Medications Ordered in Other Encounters:  .  fentaNYL (SUBLIMAZE) injection 25-50 mcg, 25-50 mcg, Intravenous, Q5 min PRN, Peyton Najjar, MD .  lactated ringers infusion, , Intravenous, Continuous, Peyton Najjar, MD  Allergies  Allergen Reactions  . Penicillins Anaphylaxis  . Adhesive [Tape] Other (See Comments)    Blisters   . Doxycycline Nausea And Vomiting  . Morphine And Related Other (See Comments)    HALLUCINATIONS     Objective:     BP 153/87 mmHg  Pulse 87  Temp(Src) 98.7 F (37.1 C) (Oral)  Resp 20  Ht 5\' 6"  (1.676 m)  Wt 74.39 kg (164 lb)  BMI 26.48 kg/m2  SpO2 96%  She is in no distress  Nonicteric  Heart regular rhythm no murmurs  Lungs clear  Abdomen: Bowel sounds normal, soft, nontender  Laboratory No components found for: D1    Assessment:     Pneumonia  Multiple other medical problems  Iron deficiency anemia      Plan:     Given the low hemoglobin and hematocrit I think it would be reasonable to reevaluate the patient. We can set her up for EGD and colonoscopy on Monday. This will give her a little more time to improve from her pneumonia. Lab Results  Component Value Date   HGB 9.2* 04/12/2014   HGB 6.4* 04/11/2014   HGB 7.9* 04/09/2014   HGB 12.6 08/01/2013   HGB 11.8 01/04/2013   HGB 12.3 07/04/2012   HCT 31.3* 04/12/2014   HCT 23.3* 04/11/2014   HCT 28.8* 04/09/2014   HCT 39.1 08/01/2013   HCT 36.6 01/04/2013   HCT 36.2 07/04/2012   ALKPHOS 69 04/12/2014   ALKPHOS 88 04/09/2014   ALKPHOS 111  08/01/2013   ALKPHOS 77 04/17/2013   ALKPHOS 139 01/04/2013   ALKPHOS 72 07/04/2012   AST 14 04/12/2014   AST 22 04/09/2014   AST 12 08/01/2013   AST 20 04/17/2013   AST 17 01/04/2013   AST 20 07/04/2012   ALT 16 04/12/2014   ALT 20 04/09/2014   ALT 9 08/01/2013   ALT 24 04/17/2013   ALT 30 01/04/2013   ALT 22 07/04/2012

## 2014-04-16 NOTE — Op Note (Signed)
[facility] [place], [address]   ENDOSCOPY PROCEDURE REPORT  PATIENT: Kelsey, [name]  MR#: [id number] BIRTHDATE: 15-Oct-1934 , 89  yrs. old GENDER: female ENDOSCOPIST:Friedrich Harriott Oletta Lamas, MD REFERRED BY: Reynold Bowen, M.D. PROCEDURE DATE:  04/16/2014 PROCEDURE:   EGD, diagnostic ASA CLASS:    Class III INDICATIONS: Chronic iron deficiency anemia and positive stool. MEDICATION: Monitored anesthesia care and Per Anesthesia TOPICAL ANESTHETIC:   Cetacaine Spray  DESCRIPTION OF PROCEDURE:   After the risks and benefits of the procedure were explained, informed consent was obtained.  The Pentax Gastroscope [id number]  endoscope was introduced through the mouth  and advanced to the second portion of the duodenum .  The instrument was slowly withdrawn as the mucosa was fully examined.      No varices or esophagitis seen.  patient had a very large hiatal hernia.  Diaphragmatic hiatus was seen in about 38 cm in the GE junction was seen 31 cm from the teeth.  This was a large hiatal hernia without any ulcerations or erosions.  The stomach was carefully examined in no other lesions were seen in the forward or retro flex view.   No ulceration or signs of bleeding.  DUODENUM: The duodenal mucosa showed no abnormalities. Retroflexed views revealed a hiatal hernia.    The scope was then withdrawn from the patient and the procedure completed.  COMPLICATIONS: There were no immediate complications.  ENDOSCOPIC IMPRESSION: 1.   No varices or esophagitis seen.  patient had a very large hiatal hernia.  Diaphragmatic hiatus was seen in about 38 cm in the GE junction was seen 31 cm from the teeth.  This was a large hiatal hernia without any ulcerations or erosions.  The stomach was carefully examined in no other lesions were seen in the forward or retro flex view 2.   No ulceration or signs of bleeding 3.   The duodenal mucosa showed no abnormalities 4.      No bleeding site seen. RECOMMENDATIONS: Colonoscopy today   _______________________________ eSignedLaurence Spates, MD 04/16/2014 2:28 PM     cc: Reynold Bowen, MD  CPT CODES: ICD CODES:  The ICD and CPT codes recommended by this software are interpretations from the data that the clinical staff has captured with the software.  The verification of the translation of this report to the ICD and CPT codes and modifiers is the sole responsibility of the health care institution and practicing physician where this report was generated.  [place]. will not be held responsible for the validity of the ICD and CPT codes included on this report.  AMA assumes no liability for data contained or not contained herein. CPT is a Designer, television/film set of the [employer].  PATIENT NAME:  Kelsey, [name] MR#: [id number]

## 2014-04-16 NOTE — Progress Notes (Signed)
CARE MANAGEMENT NOTE 04/16/2014  Patient:  [name], [name]   Account Number:  [id number]  Date Initiated:  04/10/2014  Documentation initiated by:  Karl Bales  Subjective/Objective Assessment:   Pt admitted with cco SOB, HCAP     Action/Plan:   from home   Anticipated DC Date:  04/13/2014   Anticipated DC Plan:  [place]  CM consult      [facility] Choice  HOME HEALTH   Choice offered to / List presented to:  C-1 Patient        [place] arranged  [facility]      [place].   Status of service:  In process, will continue to follow Medicare Important Message given?  YES (If response is "NO", the following Medicare IM given date fields will be blank) Date Medicare IM given:  04/16/2014 Medicare IM given by:  Karl Bales Date Additional Medicare IM given:   Additional Medicare IM given by:    Discharge Disposition:    Per UR Regulation:  Reviewed for med. necessity/level of care/duration of stay  If discussed at [place] of Stay Meetings, dates discussed:    Comments:  04/16/14 MMcGibboney, RN, BSN Pt selected [place]. Referral given to in house rep.  04/15/14 MMcGibboney, RN, BSN Chart reviewed.  04/10/14 MMcGibboney, RN, BSN Chart reviewed.

## 2014-04-17 ENCOUNTER — Inpatient Hospital Stay (HOSPITAL_COMMUNITY): Payer: Medicare Other

## 2014-04-17 ENCOUNTER — Encounter (HOSPITAL_COMMUNITY): Payer: Self-pay | Admitting: Gastroenterology

## 2014-04-17 LAB — CBC
HEMATOCRIT: 31.9 % — AB (ref 36.0–46.0)
Hemoglobin: 9.2 g/dL — ABNORMAL LOW (ref 12.0–15.0)
MCH: 23.7 pg — ABNORMAL LOW (ref 26.0–34.0)
MCHC: 28.8 g/dL — AB (ref 30.0–36.0)
MCV: 82.2 fL (ref 78.0–100.0)
PLATELETS: 245 10*3/uL (ref 150–400)
RBC: 3.88 MIL/uL (ref 3.87–5.11)
RDW: 19.5 % — ABNORMAL HIGH (ref 11.5–15.5)
WBC: 6 10*3/uL (ref 4.0–10.5)

## 2014-04-17 LAB — COMPREHENSIVE METABOLIC PANEL
ALT: 25 U/L (ref 0–35)
AST: 22 U/L (ref 0–37)
Albumin: 2.7 g/dL — ABNORMAL LOW (ref 3.5–5.2)
Alkaline Phosphatase: 86 U/L (ref 39–117)
Anion gap: 5 (ref 5–15)
BUN: 20 mg/dL (ref 6–23)
CHLORIDE: 105 mmol/L (ref 96–112)
CO2: 29 mmol/L (ref 19–32)
Calcium: 8.3 mg/dL — ABNORMAL LOW (ref 8.4–10.5)
Creatinine, Ser: 0.84 mg/dL (ref 0.50–1.10)
GFR calc Af Amer: 75 mL/min — ABNORMAL LOW (ref >90)
GFR, EST NON AFRICAN AMERICAN: 65 mL/min — AB (ref >90)
Glucose, Bld: 95 mg/dL (ref 70–99)
Potassium: 3.7 mmol/L (ref 3.5–5.1)
Sodium: 139 mmol/L (ref 135–145)
Total Bilirubin: 0.2 mg/dL — ABNORMAL LOW (ref 0.3–1.2)
Total Protein: 5.4 g/dL — ABNORMAL LOW (ref 6.0–8.3)

## 2014-04-17 MED ORDER — PREDNISONE 5 MG PO TABS
5.0000 mg | ORAL_TABLET | Freq: Every day | ORAL | Status: DC
  Administered 2014-04-17: 5 mg via ORAL
  Filled 2014-04-17 (×2): qty 1

## 2014-04-17 MED ORDER — SODIUM CHLORIDE 0.9 % IV SOLN
1000.0000 mg | Freq: Once | INTRAVENOUS | Status: AC
  Administered 2014-04-17: 1000 mg via INTRAVENOUS
  Filled 2014-04-17: qty 20

## 2014-04-17 MED ORDER — CALCITONIN (SALMON) 200 UNIT/ACT NA SOLN
1.0000 | Freq: Every day | NASAL | Status: DC
  Administered 2014-04-17 – 2014-04-18 (×2): 1 via NASAL
  Filled 2014-04-17: qty 3.7

## 2014-04-17 MED ORDER — SODIUM CHLORIDE 0.9 % IV SOLN
25.0000 mg | Freq: Once | INTRAVENOUS | Status: AC
  Administered 2014-04-17: 25 mg via INTRAVENOUS
  Filled 2014-04-17: qty 0.5

## 2014-04-17 NOTE — Progress Notes (Signed)
Physical Therapy Treatment Patient Details Name: [name] MRN: [id number] DOB: 14-Sep-1934 Today's Date: 2014-04-29    History of Present Illness 80 y.o. female with past medical history of DVT, history of pulmonary embolism on aspirin (patient is not a good candidate for anticoagulation due to history of GI bleeding), congestive heart failure, hyperlipidemia, history of breast cancer (s/p surgery, chemotherapy and radiation therapy), history of seizure, asthma and admitted 04/09/14 with SOB.    PT Comments    Pt tolerated ambulation well and now plans to d/c to SNF likely tomorrow.  Follow Up Recommendations  Supervision for mobility/OOB;SNF     Equipment Recommendations  None recommended by PT    Recommendations for Other Services       Precautions / Restrictions Precautions Precautions: Fall Precaution Comments: monitor sats    Mobility  Bed Mobility Overal bed mobility: Modified Independent             General bed mobility comments: increased time and effort  Transfers Overall transfer level: Needs assistance Equipment used: Rolling walker (2 wheeled) Transfers: Sit to/from Stand Sit to Stand: Min guard         General transfer comment: verbal cues for hand placement  Ambulation/Gait Ambulation/Gait assistance: Min guard Ambulation Distance (Feet): 200 Feet Assistive device: Rolling walker (2 wheeled) Gait Pattern/deviations: Step-through pattern;Decreased stride length Gait velocity: decr   General Gait Details: improved steadiness observed compared to previous visits, verbal cues for RW positioning, SpO2 95% room air during ambulation   Stairs            Wheelchair Mobility    Modified Rankin (Stroke Patients Only)       Balance                                    Cognition Arousal/Alertness: Awake/alert Behavior During Therapy: WFL for tasks assessed/performed Overall Cognitive Status: Within Functional Limits  for tasks assessed                      Exercises      General Comments        Pertinent Vitals/Pain Pain Assessment: 0-10 Pain Score: 4  Pain Location: back Pain Descriptors / Indicators: Sore;Aching Pain Intervention(s): Limited activity within patient's tolerance;Monitored during session;Repositioned    Home Living                      Prior Function            PT Goals (current goals can now be found in the care plan section) Progress towards PT goals: Progressing toward goals    Frequency  Min 3X/week    PT Plan Discharge plan needs to be updated    Co-evaluation             End of Session   Activity Tolerance: Patient tolerated treatment well Patient left: with call bell/phone within reach;in bed;with nursing/sitter in room     Time: 1349-1402 PT Time Calculation (min) (ACUTE ONLY): 13 min  Charges:  $Gait Training: 8-22 mins                    G Codes:      Atianna Haidar,KATHrine E April 29, 2014, 3:02 PM Carmelia Bake, PT, DPT April 29, 2014 Pager: [phone]

## 2014-04-17 NOTE — Progress Notes (Signed)
   04/17/14 0949  Clinical Encounter Type  Visited With Patient  Visit Type Spiritual support  Referral From Nurse  Stress Factors  Patient Stress Factors Loss  Chaplain responded to spiritual care consult. Chaplain engaged pt regarding recent loss of her husband; pt shared what it has been like for her and some of the ways she has taken care of herself through the grief process. Chaplain listened empathically, explored emotional and relational needs and resources. Chaplain provided prayer. Pt expressed gratitude for chaplain visit. Vanetta Mulders 04/17/2014 9:51 AM

## 2014-04-17 NOTE — Progress Notes (Signed)
Clinical Social Work Department CLINICAL SOCIAL WORK PLACEMENT NOTE 04/17/2014  Patient:  [name], [name]  Account Number:  [id number] Admit date:  04/09/2014  Clinical Social Worker:  Renold Genta  Date/time:  04/17/2014 11:20 AM  Clinical Social Work is seeking post-discharge placement for this patient at the following level of care:   SKILLED NURSING   (*CSW will update this form in Epic as items are completed)   04/17/2014  Patient/family provided with [place] Department of Clinical Social Work's list of facilities offering this level of care within the geographic area requested by the patient (or if unable, by the patient's family).  04/17/2014  Patient/family informed of their freedom to choose among providers that offer the needed level of care, that participate in Medicare, Medicaid or managed care program needed by the patient, have an available bed and are willing to accept the patient.  04/17/2014  Patient/family informed of [facility]' ownership interest in [facility], as well as of the fact that they are under no obligation to receive care at this facility.  PASARR submitted to EDS on 04/17/2014 PASARR number received on 04/17/2014  FL2 transmitted to all facilities in geographic area requested by pt/family on  04/17/2014 FL2 transmitted to all facilities within larger geographic area on   Patient informed that his/her managed care company has contracts with or will negotiate with  certain facilities, including the following:     Patient/family informed of bed offers received:  04/17/2014 Patient chooses bed at [place] Physician recommends and patient chooses bed at    Patient to be transferred to [place] on   Patient to be transferred to facility by  Patient and family notified of transfer on  Name of family member notified:    The following physician request were entered in Epic:   Additional Comments:    Raynaldo Opitz,  [place] Social Worker cell #: [phone]

## 2014-04-17 NOTE — Care Management Note (Signed)
    Page 1 of 1   04/17/2014     2:48:04 PM CARE MANAGEMENT NOTE 04/17/2014  Patient:  [name], [name]   Account Number:  [id number]  Date Initiated:  04/10/2014  Documentation initiated by:  Karl Bales  Subjective/Objective Assessment:   Pt admitted with cco SOB, HCAP     Action/Plan:   from home   Anticipated DC Date:  04/18/2014   Anticipated DC Plan:  [place]  CM consult      [facility] Choice  HOME HEALTH   Choice offered to / List presented to:  C-1 Patient           Status of service:  In process, will continue to follow Medicare Important Message given?  YES (If response is "NO", the following Medicare IM given date fields will be blank) Date Medicare IM given:  04/16/2014 Medicare IM given by:  Karl Bales Date Additional Medicare IM given:  04/17/2014 Additional Medicare IM given by:  [facility]  Discharge Disposition:    Per UR Regulation:  Reviewed for med. necessity/level of care/duration of stay  If discussed at [place] of Stay Meetings, dates discussed:    Comments:  04/17/14 Dessa Phi RN BSN CM [phone] PT-SNf.CSW -SNF in am.  04/16/14 MMcGibboney, RN, BSN Pt selected [facility]. Referral given to in house rep.  04/15/14 MMcGibboney, RN, BSN Chart reviewed.  04/10/14 MMcGibboney, RN, BSN Chart reviewed.

## 2014-04-17 NOTE — Progress Notes (Signed)
Subjective: Still having some back pain. No further overt blood in stool.  Objective: Vital signs in last 24 hours: Temp:  [97.7 F (36.5 C)-99.1 F (37.3 C)] 99.1 F (37.3 C) (02/16 2205) Pulse Rate:  [72-90] 90 (02/16 2205) Resp:  [11-23] 18 (02/16 2205) BP: (135-180)/(62-106) 145/65 mmHg (02/16 2205) SpO2:  [94 %-100 %] 94 % (02/16 2205) Weight change:  Last BM Date: 04/16/14  PE: GEN:  NAD ABD:  Soft  Lab Results: CBC    Component Value Date/Time   WBC 6.0 04/17/2014 0445   WBC 8.9 08/01/2013 1057   RBC 3.88 04/17/2014 0445   RBC 3.38* 04/10/2014 0900   RBC 4.58 08/01/2013 1057   HGB 9.2* 04/17/2014 0445   HGB 12.6 08/01/2013 1057   HCT 31.9* 04/17/2014 0445   HCT 39.1 08/01/2013 1057   PLT 245 04/17/2014 0445   PLT 331 08/01/2013 1057   MCV 82.2 04/17/2014 0445   MCV 85.5 08/01/2013 1057   MCH 23.7* 04/17/2014 0445   MCH 27.5 08/01/2013 1057   MCHC 28.8* 04/17/2014 0445   MCHC 32.1 08/01/2013 1057   RDW 19.5* 04/17/2014 0445   RDW 16.1* 08/01/2013 1057   LYMPHSABS 1.9 04/15/2014 0450   LYMPHSABS 2.1 08/01/2013 1057   MONOABS 0.7 04/15/2014 0450   MONOABS 0.5 08/01/2013 1057   EOSABS 0.2 04/15/2014 0450   EOSABS 0.0 08/01/2013 1057   BASOSABS 0.0 04/15/2014 0450   BASOSABS 0.1 08/01/2013 1057   CMP     Component Value Date/Time   NA 139 04/17/2014 0445   NA 143 08/01/2013 1057   K 3.7 04/17/2014 0445   K 3.3* 08/01/2013 1057   CL 105 04/17/2014 0445   CL 102 07/04/2012 1456   CO2 29 04/17/2014 0445   CO2 19* 08/01/2013 1057   GLUCOSE 95 04/17/2014 0445   GLUCOSE 106 08/01/2013 1057   GLUCOSE 92 07/04/2012 1456   BUN 20 04/17/2014 0445   BUN 14.7 08/01/2013 1057   CREATININE 0.84 04/17/2014 0445   CREATININE 0.8 08/01/2013 1057   CALCIUM 8.3* 04/17/2014 0445   CALCIUM 9.1 08/01/2013 1057   PROT 5.4* 04/17/2014 0445   PROT 6.9 08/01/2013 1057   ALBUMIN 2.7* 04/17/2014 0445   ALBUMIN 2.9* 08/01/2013 1057   AST 22 04/17/2014 0445   AST 12  08/01/2013 1057   ALT 25 04/17/2014 0445   ALT 9 08/01/2013 1057   ALKPHOS 86 04/17/2014 0445   ALKPHOS 111 08/01/2013 1057   BILITOT 0.2* 04/17/2014 0445   BILITOT 0.34 08/01/2013 1057   GFRNONAA 65* 04/17/2014 0445   GFRAA 75* 04/17/2014 0445   Assessment:  1. Iron-deficiency anemia.  Suspect patient's large hiatal hernia could be playing a role. 2. Abdominal pain, likely multifactorial, improving. 3. Constipation.  Plan:  1.  Advance diet as tolerated. 2.  No need for any more GI tract evaluation at the present time. 3.  Will sign-off; thank you for the consult; patient can follow-up with Dr. Watt Climes, her gastroenterologist, upon discharge.     Landry Dyke 04/17/2014, 9:47 AM

## 2014-04-17 NOTE — Progress Notes (Signed)
Clinical Social Work Department BRIEF PSYCHOSOCIAL ASSESSMENT 04/17/2014  Patient:  [name], [name]     Account Number:  [id number]     Admit date:  04/09/2014  Clinical Social Worker:  Renold Genta  Date/Time:  04/17/2014 11:14 AM  Referred by:  Physician  Date Referred:  04/17/2014 Referred for  SNF Placement   Other Referral:   Interview type:  Patient Other interview type:   and daughter, Gerald Stabs via phone    PSYCHOSOCIAL DATA Living Status:  WITH ADULT CHILDREN Admitted from facility:   Level of care:   Primary support name:  Ronney Lion (daughter) c#: [phone] Primary support relationship to patient:  CHILD, ADULT Degree of support available:   good    CURRENT CONCERNS Current Concerns  Post-Acute Placement   Other Concerns:    SOCIAL WORK ASSESSMENT / PLAN CSW received consult for SNF placement.   Assessment/plan status:  Information/Referral to [employer] Other assessment/ plan:   Information/referral to community resources:   CSW completed FL2 and faxed information to [place] [place] - provided SNF list.    PATIENT'S/FAMILY'S RESPONSE TO PLAN OF CARE: CSW had received call from 4th Floor Charge Nurse that Dr. Forde Dandy had talked with patient and came to the resolution that patient will go to SNF tomorrow. Patient's daughter, Gerald Stabs requested [facility] as she has been there in the past. CSW informed daughter that Dr. Forde Dandy would be able to follow her at [facility] but doesn't go to [facility]. Daughter adamant about wanting her to go to [place], [place] confirmed with patient and she spoke with her daughter as well and is agreeable with [facility]. CSW confimed with Ivin Booty at [facility] that they would be able to take patient when ready for discharge - anticipating tomorrow.          Raynaldo Opitz, [facility] Clinical Social Worker cell #: [phone]

## 2014-04-17 NOTE — Evaluation (Signed)
Occupational Therapy Evaluation Patient Details Name: [name] MRN: [id number] DOB: 14-Sep-1934 Today's Date: 04/17/2014    History of Present Illness 80 y.o. female with past medical history of DVT, history of pulmonary embolism on aspirin (patient is not a good candidate for anticoagulation due to history of GI bleeding), congestive heart failure, hyperlipidemia, history of breast cancer (s/p surgery, chemotherapy and radiation therapy), history of seizure, asthma and admitted 04/09/14 with SOB.   Clinical Impression   Focus of session on toilet transfer and grooming.  Pt progressing in activity tolerance and mobility.  Agreeable to ST rehab in SNF after conversation earlier with Dr. Forde Dandy.  Reinforced this decision, educating pt on the benefits.    Follow Up Recommendations  SNF    Equipment Recommendations       Recommendations for Other Services       Precautions / Restrictions Precautions Precautions: Fall Restrictions Weight Bearing Restrictions: No      Mobility Bed Mobility Overal bed mobility: Needs Assistance Bed Mobility: Supine to Sit;Sit to Supine     Supine to sit: Supervision Sit to supine: Supervision   General bed mobility comments: no physical assist, HOB up 20 degrees, use of rail  Transfers Overall transfer level: Needs assistance Equipment used: 1 person hand held assist Transfers: Stand Pivot Transfers   Stand pivot transfers: Min assist            Balance                                            ADL Overall ADL's : Needs assistance/impaired     Grooming: Wash/dry hands;Wash/dry face;Oral care;Set up;Sitting;Brushing hair                   Toilet Transfer: Minimal assistance;Stand-pivot;BSC, one hand assist for stability   Toileting- Clothing Manipulation and Hygiene: Minimal assistance;Sit to/from stand         General ADL Comments: Instructed in energy conservation strategies and pacing.      Vision     Perception     Praxis      Pertinent Vitals/Pain Pain Assessment: No/denies pain     Hand Dominance     Extremity/Trunk Assessment             Communication     Cognition Arousal/Alertness: Awake/alert Behavior During Therapy: WFL for tasks assessed/performed Overall Cognitive Status: Within Functional Limits for tasks assessed                     General Comments       Exercises       Shoulder Instructions      Home Living                                          Prior Functioning/Environment               OT Diagnosis:     OT Problem List:     OT Treatment/Interventions:      OT Goals(Current goals can be found in the care plan section)    OT Frequency: Min 2X/week   Barriers to D/C:            Co-evaluation  End of Session Equipment Utilized During Treatment: Gait belt  Activity Tolerance: Patient tolerated treatment well Patient left: in bed (going for xray)   Time: 7564-3329 OT Time Calculation (min): 24 min Charges:  OT General Charges $OT Visit: 1 Procedure OT Treatments $Self Care/Home Management : 23-37 mins G-Codes:    Malka So 04/17/2014, 9:17 AM [phone]

## 2014-04-17 NOTE — Progress Notes (Signed)
Patient's IV had occluded.  IV team came by and tried to restart, but was unsuccessful.  IV team expressed need for PICC.  Dr. Forde Dandy notified, and requested that we try again, and if we weren't able to get anything, cancel the order.  This nurse tried, and was able to get an IV.  IV test dose of Infed given, without any difficulties.  Durwin Nora RN

## 2014-04-17 NOTE — Progress Notes (Signed)
Subjective: DId well with procedures. Only one small polyp. Still has back pain with radiation.. Min cough, quite weak   Objective: Vital signs in last 24 hours: Temp:  [97.7 F (36.5 C)-99.1 F (37.3 C)] 99.1 F (37.3 C) (02/16 2205) Pulse Rate:  [72-90] 90 (02/16 2205) Resp:  [11-23] 18 (02/16 2205) BP: (135-180)/(62-106) 145/65 mmHg (02/16 2205) SpO2:  [94 %-100 %] 94 % (02/16 2205)  Intake/Output from previous day: 02/16 0701 - 02/17 0700 In: 1160 [P.O.:360; I.V.:800] Out: -  Intake/Output this shift:    Non toxic Cushingoid. Lungs clearer. Ht regular abd softer with good BS, 1+ edema. Awake,. mentating well, weak  Lab Results   Recent Labs  04/15/14 0450 04/17/14 0445  WBC 6.7 6.0  RBC 4.42 3.88  HGB 10.3* 9.2*  HCT 36.1 31.9*  MCV 81.7 82.2  MCH 23.3* 23.7*  RDW 19.6* 19.5*  PLT 258 245    Recent Labs  04/15/14 0450  NA 141  K 4.3  CL 104  CO2 33*  GLUCOSE 92  BUN 14  CREATININE 0.90  CALCIUM 8.4    Studies/Results: No results found.  Scheduled Meds: . aspirin EC  81 mg Oral Daily  . calcitonin (salmon)  1 spray Alternating Nares Daily  . DULoxetine  30 mg Oral Daily  . gabapentin  300 mg Oral BID  . heparin  5,000 Units Subcutaneous 3 times per day  . hyoscyamine  0.375 mg Oral Daily  . Influenza vac split quadrivalent PF  0.5 mL Intramuscular Tomorrow-1000  . levETIRAcetam  500 mg Oral BID  . levofloxacin  500 mg Oral QHS  . levothyroxine  88 mcg Oral QAC breakfast  . pantoprazole  40 mg Oral Daily  . [START ON 04/18/2014] predniSONE  5 mg Oral Q breakfast  . saccharomyces boulardii  250 mg Oral BID   Continuous Infusions: . sodium chloride 10 mL/hr at 04/14/14 2247   PRN Meds:fentaNYL, hydrALAZINE, LORazepam, LORazepam, ondansetron, oxyCODONE-acetaminophen  Assessment/Plan:  PNEUMONIA: will make today the last day of Abx. Recheck XR and effusions today IRON DEFICIENT ANEMIA, SYMPTOMATIC: sl worse at 9, investigations fairly  unremarkable. 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

## 2014-04-18 DIAGNOSIS — G629 Polyneuropathy, unspecified: Secondary | ICD-10-CM | POA: Diagnosis not present

## 2014-04-18 DIAGNOSIS — M810 Age-related osteoporosis without current pathological fracture: Secondary | ICD-10-CM | POA: Diagnosis not present

## 2014-04-18 DIAGNOSIS — R569 Unspecified convulsions: Secondary | ICD-10-CM | POA: Diagnosis not present

## 2014-04-18 DIAGNOSIS — E43 Unspecified severe protein-calorie malnutrition: Secondary | ICD-10-CM | POA: Diagnosis not present

## 2014-04-18 DIAGNOSIS — E039 Hypothyroidism, unspecified: Secondary | ICD-10-CM | POA: Diagnosis not present

## 2014-04-18 DIAGNOSIS — E559 Vitamin D deficiency, unspecified: Secondary | ICD-10-CM | POA: Diagnosis not present

## 2014-04-18 DIAGNOSIS — E2740 Unspecified adrenocortical insufficiency: Secondary | ICD-10-CM | POA: Diagnosis not present

## 2014-04-18 DIAGNOSIS — E785 Hyperlipidemia, unspecified: Secondary | ICD-10-CM | POA: Diagnosis not present

## 2014-04-18 DIAGNOSIS — F419 Anxiety disorder, unspecified: Secondary | ICD-10-CM | POA: Diagnosis not present

## 2014-04-18 DIAGNOSIS — G622 Polyneuropathy due to other toxic agents: Secondary | ICD-10-CM | POA: Diagnosis not present

## 2014-04-18 DIAGNOSIS — R2681 Unsteadiness on feet: Secondary | ICD-10-CM | POA: Diagnosis not present

## 2014-04-18 DIAGNOSIS — M6281 Muscle weakness (generalized): Secondary | ICD-10-CM | POA: Diagnosis not present

## 2014-04-18 DIAGNOSIS — R0602 Shortness of breath: Secondary | ICD-10-CM | POA: Diagnosis not present

## 2014-04-18 DIAGNOSIS — M4850XS Collapsed vertebra, not elsewhere classified, site unspecified, sequela of fracture: Secondary | ICD-10-CM | POA: Diagnosis not present

## 2014-04-18 DIAGNOSIS — K589 Irritable bowel syndrome without diarrhea: Secondary | ICD-10-CM | POA: Diagnosis not present

## 2014-04-18 DIAGNOSIS — F418 Other specified anxiety disorders: Secondary | ICD-10-CM | POA: Diagnosis not present

## 2014-04-18 DIAGNOSIS — I509 Heart failure, unspecified: Secondary | ICD-10-CM | POA: Diagnosis not present

## 2014-04-18 DIAGNOSIS — Z86711 Personal history of pulmonary embolism: Secondary | ICD-10-CM | POA: Diagnosis not present

## 2014-04-18 DIAGNOSIS — R531 Weakness: Secondary | ICD-10-CM | POA: Diagnosis not present

## 2014-04-18 DIAGNOSIS — E46 Unspecified protein-calorie malnutrition: Secondary | ICD-10-CM | POA: Diagnosis not present

## 2014-04-18 DIAGNOSIS — J189 Pneumonia, unspecified organism: Secondary | ICD-10-CM | POA: Diagnosis not present

## 2014-04-18 DIAGNOSIS — M8080XS Other osteoporosis with current pathological fracture, unspecified site, sequela: Secondary | ICD-10-CM | POA: Diagnosis not present

## 2014-04-18 DIAGNOSIS — G40909 Epilepsy, unspecified, not intractable, without status epilepticus: Secondary | ICD-10-CM | POA: Diagnosis not present

## 2014-04-18 DIAGNOSIS — F329 Major depressive disorder, single episode, unspecified: Secondary | ICD-10-CM | POA: Diagnosis not present

## 2014-04-18 DIAGNOSIS — C50919 Malignant neoplasm of unspecified site of unspecified female breast: Secondary | ICD-10-CM | POA: Diagnosis not present

## 2014-04-18 DIAGNOSIS — G9009 Other idiopathic peripheral autonomic neuropathy: Secondary | ICD-10-CM | POA: Diagnosis not present

## 2014-04-18 DIAGNOSIS — K2971 Gastritis, unspecified, with bleeding: Secondary | ICD-10-CM | POA: Diagnosis not present

## 2014-04-18 DIAGNOSIS — I4519 Other right bundle-branch block: Secondary | ICD-10-CM | POA: Diagnosis not present

## 2014-04-18 DIAGNOSIS — K219 Gastro-esophageal reflux disease without esophagitis: Secondary | ICD-10-CM | POA: Diagnosis not present

## 2014-04-18 DIAGNOSIS — E440 Moderate protein-calorie malnutrition: Secondary | ICD-10-CM | POA: Diagnosis not present

## 2014-04-18 DIAGNOSIS — K5289 Other specified noninfective gastroenteritis and colitis: Secondary | ICD-10-CM | POA: Diagnosis not present

## 2014-04-18 DIAGNOSIS — D509 Iron deficiency anemia, unspecified: Secondary | ICD-10-CM | POA: Diagnosis not present

## 2014-04-18 DIAGNOSIS — R5381 Other malaise: Secondary | ICD-10-CM | POA: Diagnosis not present

## 2014-04-18 DIAGNOSIS — R0789 Other chest pain: Secondary | ICD-10-CM | POA: Diagnosis not present

## 2014-04-18 MED ORDER — HEPARIN SODIUM (PORCINE) 5000 UNIT/ML IJ SOLN: 5000.0000 [IU] | Freq: Three times a day (TID) | INTRAMUSCULAR | Status: DC

## 2014-04-18 MED ORDER — OXYCODONE-ACETAMINOPHEN 5-325 MG PO TABS: 1.0000 | ORAL_TABLET | Freq: Four times a day (QID) | ORAL | Status: DC | PRN

## 2014-04-18 MED ORDER — CALCITONIN (SALMON) 200 UNIT/ACT NA SOLN: 1.0000 | Freq: Every day | NASAL | Status: DC

## 2014-04-18 NOTE — Progress Notes (Signed)
Patient is set to discharge to [place] today. Patient & daughter, Gerald Stabs aware. Discharge packet given to RN, Manuela Schwartz. [facility] called for transport.   Clinical Social Work Department CLINICAL SOCIAL WORK PLACEMENT NOTE 04/18/2014  Patient:  SHELLSEA, BORUNDA  Account Number:  [id number] Admit date:  04/09/2014  Clinical Social Worker:  Renold Genta  Date/time:  04/17/2014 11:20 AM  Clinical Social Work is seeking post-discharge placement for this patient at the following level of care:   SKILLED NURSING   (*CSW will update this form in Epic as items are completed)   04/17/2014  Patient/family provided with [place] Department of Clinical Social Work's list of facilities offering this level of care within the geographic area requested by the patient (or if unable, by the patient's family).  04/17/2014  Patient/family informed of their freedom to choose among providers that offer the needed level of care, that participate in Medicare, Medicaid or managed care program needed by the patient, have an available bed and are willing to accept the patient.  04/17/2014  Patient/family informed of [facility]' ownership interest in [facility], as well as of the fact that they are under no obligation to receive care at this facility.  PASARR submitted to EDS on 04/17/2014 PASARR number received on 04/17/2014  FL2 transmitted to all facilities in geographic area requested by pt/family on  04/17/2014 FL2 transmitted to all facilities within larger geographic area on   Patient informed that his/her managed care company has contracts with or will negotiate with  certain facilities, including the following:     Patient/family informed of bed offers received:  04/17/2014 Patient chooses bed at [place] Physician recommends and patient chooses bed at    Patient to be transferred to [place] on  04/18/2014 Patient to be transferred to facility by [facility] Patient and family  notified of transfer on 04/18/2014 Name of family member notified:  patient's daughter, Gerald Stabs via phone  The following physician request were entered in Epic:   Additional Comments:   Raynaldo Opitz, [place] Social Worker cell #: [phone]

## 2014-04-18 NOTE — Progress Notes (Signed)
Report called to [place] at [facility].

## 2014-04-18 NOTE — Discharge Summary (Signed)
Nambe  MR#: [id number]  DOB:Sep 01, 1934  Date of Admission: 04/09/2014 Date of Discharge: 04/18/2014  Attending Physician:Jazelle Achey [name]  Patient's NTI:[id number] [name], MD  Consults:Treatment Team:  Wonda Horner, MD   Discharge Diagnoses: Principal Problem:   CAP (community acquired pneumonia), clinically improved, finished with Levaquin Active Problems:   Obstructive sleep apnea   Breast cancer, stage 2 Right, stable with no evidence of disease   Seasonal allergies   Seizures, stable on Keppra   Irritable bowel syndrome, on medications   Peripheral neuropathy, on gabapentin, from chemotherapy   UGIB (upper gastrointestinal bleed), with profound iron deficiency anemia. Now status post endoscopy and colonoscopy, with 2 units of transfusions required   DVT, LLE, no recurrence, prior pulmonary embolus, now on heparin subcutaneously   Depression, fair   Hypothyroidism, on therapy Osteoporosis with compression fracture, on Miacalcin Glucocorticoid deficiency, on long-term therapy Vitamin D deficiency, on therapy History of microscopic colitis Incomplete right bundle branch block Brief SVT, without symptoms Impaired glucose tolerance Moderate protein calorie malnutrition Weakness and unstable gait Small pleural effusions Significant constipation, improved   Discharge Medications:   Medication List    STOP taking these medications        potassium chloride SA 20 MEQ tablet  Commonly known as:  K-DUR,KLOR-CON      TAKE these medications        aspirin EC 81 MG tablet  Take 81 mg by mouth daily.     calcitonin (salmon) 200 UNIT/ACT nasal spray  Commonly known as:  MIACALCIN/FORTICAL  Place 1 spray into alternate nostrils daily.     DEXILANT 60 MG capsule  Generic drug:  dexlansoprazole  Take 60 mg by mouth daily.     DULoxetine 60 MG capsule  Commonly known as:  CYMBALTA  Take 60 mg by mouth daily after lunch.      ergocalciferol 50000 UNITS capsule  Commonly known as:  VITAMIN D2  Take 50,000 Units by mouth once a week. Every Tuesday.     ezetimibe-simvastatin 10-80 MG per tablet  Commonly known as:  VYTORIN  Take 1 tablet by mouth at bedtime. For hyperlipidemia     gabapentin 100 MG capsule  Commonly known as:  NEURONTIN  Take 100 mg by mouth daily.     heparin 5000 UNIT/ML injection  Inject 1 mL (5,000 Units total) into the skin every 8 (eight) hours.     hyoscyamine 0.375 MG 12 hr tablet  Commonly known as:  LEVBID  Take 1 tablet by mouth daily. For bowel spasms.     levETIRAcetam 500 MG tablet  Commonly known as:  KEPPRA  TAKE 1 TABLET TWICE A DAY     levothyroxine 88 MCG tablet  Commonly known as:  SYNTHROID, LEVOTHROID  Take 88 mcg by mouth daily before breakfast. For hypothyroid     LORazepam 0.5 MG tablet  Commonly known as:  ATIVAN  Take 0.5 mg by mouth daily. Anxiety     oxyCODONE-acetaminophen 5-325 MG per tablet  Commonly known as:  PERCOCET/ROXICET  Take 1 tablet by mouth every 6 (six) hours as needed for moderate pain.     predniSONE 5 MG tablet  Commonly known as:  DELTASONE  Take 5 mg by mouth daily.     UCERIS 9 MG Tb24  Generic drug:  Budesonide  Take 3 capsules by mouth daily.        Hospital Procedures: Dg Chest 2 View  04/17/2014   CLINICAL DATA:  Follow-up  of pleural effusion.  EXAM: CHEST  2 VIEW  COMPARISON:  PA and lateral chest x-ray April 02, 2014  FINDINGS: The lungs are reasonably well inflated allowing for chronic elevation of the right hemidiaphragm. The small bilateral pleural effusions layering posteriorly have decreased further in size and are now quite small. The cardiac silhouette is top-normal in size but stable. The pulmonary interstitial markings are mildly increased but have improved as well. There is a hiatal hernia. There is tortuosity of the descending thoracic aorta. There is multilevel degenerative disc disease of the thoracic  spine. There is stable loss of height of lower thoracic vertebral bodies.  IMPRESSION: There has been further interval decrease in the bilateral pleural effusions. Only small amounts of fluid persist which blunt the posterior costophrenic angles.   Electronically Signed   By: David  Martinique   On: 04/17/2014 09:25   Dg Chest 2 View  04/12/2014   CLINICAL DATA:  Community acquired bacterial pneumonia.  EXAM: CHEST  2 VIEW  COMPARISON:  April 09, 2014.  FINDINGS: Stable cardiomediastinal silhouette. Small hiatal hernia is noted. No pneumothorax is noted. Surgical clips are noted in left axillary region. Mild bilateral pleural effusions are noted. Elevated right hemidiaphragm is noted. No acute pulmonary disease is noted. Stable old vertebral body compression fracture is seen in lower thoracic spine.  IMPRESSION: Mild bilateral pleural effusions.  Small hiatal hernia.   Electronically Signed   By: Marijo Conception, M.D.   On: 04/12/2014 13:57   Dg Chest 2 View  04/09/2014   CLINICAL DATA:  Shortness of breath since 04/08/2014. Patient bent over today and felt a pop in the chest. Increasing sternal pain and shortness of breath.  EXAM: CHEST  2 VIEW  COMPARISON:  04/17/2013  FINDINGS: Shallow inspiration with elevated right hemidiaphragm. Heart size and pulmonary vascularity appear normal. No focal consolidation or airspace disease. No blunting of costophrenic angles. Esophageal hiatal hernia behind the heart. Surgical clips in the left breast and axilla. Degenerative changes in the spine with post kyphoplasty densities in the upper lumbar region. Multiple endplate compression deformities consistent with osteoporosis.  IMPRESSION: Shallow inspiration with elevation of right hemidiaphragm. No focal consolidation in the lungs.   Electronically Signed   By: Lucienne Capers M.D.   On: 04/09/2014 21:47   Ct Angio Chest Pe W/cm &/or Wo Cm  04/10/2014   CLINICAL DATA:  Shortness of breath and back pain. Elevated  D-dimer. History of bilateral breast cancer.  EXAM: CT ANGIOGRAPHY CHEST WITH CONTRAST  TECHNIQUE: Multidetector CT imaging of the chest was performed using the standard protocol during bolus administration of intravenous contrast. Multiplanar CT image reconstructions and MIPs were obtained to evaluate the vascular anatomy.  CONTRAST:  184mL OMNIPAQUE IOHEXOL 350 MG/ML SOLN  COMPARISON:  07/31/2010  FINDINGS: Technically adequate study with good opacification of the central and segmental pulmonary arteries. No focal filling defects demonstrated. No evidence of significant pulmonary embolus.  Normal heart size. Normal caliber thoracic aorta. No evidence of aortic dissection. Coronary artery and aortic calcification. Great vessel origins are patent. Esophagus is decompressed. Moderate-sized esophageal hiatal hernia. No significant lymphadenopathy in the chest.  Evaluation of lungs is limited due to respiratory motion artifact. There are diffuse emphysematous changes throughout the lungs with interstitial and alveolar infiltrates most likely to represent edema or diffuse pneumonitis. No pleural effusions. No pneumothorax.  Included portions of the upper abdomen demonstrate a small low-attenuation lesion in the liver, probably representing a cyst and stable since prior  study. Degenerative changes in the spine. Normal No destructive bone lesions. Multiple thoracic vertebral compression fractures, likely indicating osteoporosis. These appear to have been present on a previous chest radiograph from 04/17/2013, but progression is suggested.  Review of the MIP images confirms the above findings.  IMPRESSION: No evidence of significant pulmonary embolus. Diffuse airspace and interstitial disease throughout the lungs suggesting edema or infiltration. Diffuse emphysema. Multiple thoracic vertebral compression fractures demonstrating progression since previous study.   Electronically Signed   By: Lucienne Capers M.D.   On:  04/10/2014 00:12   Dg Abd 2 Views  04/13/2014   CLINICAL DATA:  80 year old female with abdominal pain, distention and constipation.  EXAM: ABDOMEN - 2 VIEW  COMPARISON:  Prior lumbar spine radiographs including the abdomen and pelvis 10/30/2012  FINDINGS: No evidence of free air on the lateral decubitus view. The bowel gas pattern is not obstructed. Moderate volume stool present within the rectum and colon. No large rectal stool ball. The bones are diffusely osteopenic. Multiple lumbar spine compression fractures. L2 and L3 are status post augmentation. There is been some interval progression of height loss at L4. Incompletely imaged ORIF of the proximal left femur. No acute osseous abnormality identified.  IMPRESSION: 1. Moderate volume of retained stool consistent with the clinical history of constipation. No large rectal stool ball. 2. No evidence of bowel obstruction or free air. 3. Slight interval progression of height loss at L4. Otherwise, multifocal compression fractures remain unchanged. 4. Profound osteopenia.   Electronically Signed   By: Jacqulynn Cadet M.D.   On: 04/13/2014 14:49   COLONOSCOPY findings: COMPLICATIONS: There were no immediate complications. ENDOSCOPIC IMPRESSION: 1. The examined terminal ileum appeared to be normal 2. Normal colonoscopy; multiple biopsies were performed using cold forceps 3. Sessile polyp was found in the descending colon; biopsy was performed using cold forceps 4. No active bleeding seen RECOMMENDATIONS: Resume diet and follow-up on the patient clinically.  ENDOSCOPY findings: ENDOSCOPIC IMPRESSION: 1. No varices or esophagitis seen. patient had a very large hiatal hernia. Diaphragmatic hiatus was seen in about 38 cm in the GE junction was seen 31 cm from the teeth. This was a large hiatal hernia without any ulcerations or erosions. The stomach was carefully examined in no other lesions were seen in the forward or retro flex view 2. No ulceration or  signs of bleeding 3. The duodenal mucosa showed no abnormalities 4. No bleeding site seen.    Colon, biopsy, Random - COLLAGENOUS MICROSCOPIC COLITIS. 2. Colon, polyp(s), descending - TUBULAR ADENOMA (X1); NEGATIVE FOR HIGH GRADE DYSPLASIA OR MALIGNANCY. Mali RUND DO Pathologist, Electronic Signature (Case signed 04/17/2014)   History of Present Illness: Cough and weakness   Hospital Course: This is a 80 year old white female long-standing patient of my practice. She was admitted by my partner with weakness and pneumonia. She was mildly hypoxic but not septic. D-dimer was mildly elevated at 1.24 but a CT scan showed no pulmonary embolus. She was treated with the IV Levaquin for a full seven-day course. Blood cultures were negative. With hydration and illness, her hemoglobin dropped quite low to 6.4 and she required 3 units of transfusion. She had both an upper and lower investigation by gastroenterology. No serious findings were found. She received IV iron yesterday and will need to be on oral iron if her hemoglobin drops again. Prior to her procedure she had quite a bit of constipation this is now resolved. 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 She is on  medications for this. Tubular adenoma was also noted.  She is quite weak and has worked a bit with therapy but as unstable gait and high risk for falling. Her cardiac status is done fairly well. She did have a brief run of SVT that was asymptomatic and EKG was nonischemic. She has osteoporosis with compression fractures of prior kyphoplasty. Her L4 vertebra appear to be a bit worse and Miacalcin is been started due to pain. She has glucocorticoid deficiency for uncertain reasons and is on chronic long-term steroids and should remain on these. She's had prior syncope this been blamed on a possible seizure disorders remains on Keppra for this. Her oral intake is improving. She has moderate protein calorie malnutrition however.  Her blood sugars are mildly elevated but come back into range. Her blood pressure is been stable. Her oxygen saturations are doing well. A repeat x-ray showed clearing of the infiltrate and reduction in pleural effusions. We restarted her neuropathy and depression medicine a few days ago and she is doing better with this. She still has some back pain today.  Day of Discharge Exam BP 163/76 mmHg  Pulse 78  Temp(Src) 97.5 F (36.4 C) (Oral)  Resp 16  Ht 5\' 6"  (1.676 m)  Wt 74.39 kg (164 lb)  BMI 26.48 kg/m2  SpO2 97%  Physical Exam: General appearance: Nontoxic but chronically ill-appearing white female lying flat in bed in no distress. Cushingoid features are present. Eyes: no scleral icterus, gaze is conjugate and there is no nystagmus Throat: oropharynx moist without erythema Resp: Slightly diminished but no wheezes or rhonchi are heard. No accessory muscles are in use Cardio: Regular systolic murmur GI: soft, non-tender; bowel sounds are good no masses,  no organomegaly Extremities: Poor musculature, mild edema with preserved pulses Skin is very thin with multiple bruising areas especially over the arms and legs Neuro: She is awake alert and mentating well. Speech is clear and fluent. She has a slight tremor due to weakness. No dysphoria is noted  Discharge Labs:  Recent Labs  04/17/14 0445  NA 139  K 3.7  CL 105  CO2 29  GLUCOSE 95  BUN 20  CREATININE 0.84  CALCIUM 8.3*    Recent Labs  04/17/14 0445  AST 22  ALT 25  ALKPHOS 86  BILITOT 0.2*  PROT 5.4*  ALBUMIN 2.7*    Recent Labs  04/17/14 0445  WBC 6.0  HGB 9.2*  HCT 31.9*  MCV 82.2  PLT 245  Results for DUSTI, TETRO (MRN [id number]) as of 04/18/2014 11:15  Ref. Range 04/11/2014 05:30 04/12/2014 08:05  WBC Latest Range: 4.0-10.5 K/uL 8.4 8.4  RBC Latest Range: 3.87-5.11 MIL/uL 2.97 (L) 3.91  Hemoglobin Latest Range: 12.0-15.0 g/dL 6.4 (LL) 9.2 (L)  HCT Latest Range: 36.0-46.0 % 23.3 (L) 31.3 (L)   MCV Latest Range: 78.0-100.0 fL 78.5 80.1  MCH Latest Range: 26.0-34.0 pg 21.5 (L) 23.5 (L)  MCHC Latest Range: 30.0-36.0 g/dL 27.5 (L) 29.4 (L)  RDW Latest Range: 11.5-15.5 % 17.4 (H) 17.3 (H)  Platelets Latest Range: 150-400 K/uL 232 228        Discharge instructions: Diet is regular. No wound care is necessary. She needs physical and occupational therapy. CODE STATUS is been full   Disposition: To skilled care  Follow-up Appts: Follow-up with Dr. Forde Dandy at [facility] post skilled discharge  Call for appointment.  Condition on Discharge: Improved  Tests Needing Follow-up: None  Signed: Etosha Wetherell [name] 04/18/2014, 11:13 AM

## 2014-04-23 ENCOUNTER — Non-Acute Institutional Stay (SKILLED_NURSING_FACILITY): Payer: Medicare Other | Admitting: Adult Health

## 2014-04-23 ENCOUNTER — Encounter: Payer: Self-pay | Admitting: Adult Health

## 2014-04-23 DIAGNOSIS — F329 Major depressive disorder, single episode, unspecified: Secondary | ICD-10-CM

## 2014-04-23 DIAGNOSIS — F419 Anxiety disorder, unspecified: Secondary | ICD-10-CM

## 2014-04-23 DIAGNOSIS — J189 Pneumonia, unspecified organism: Secondary | ICD-10-CM

## 2014-04-23 DIAGNOSIS — E2740 Unspecified adrenocortical insufficiency: Secondary | ICD-10-CM | POA: Diagnosis not present

## 2014-04-23 DIAGNOSIS — E039 Hypothyroidism, unspecified: Secondary | ICD-10-CM | POA: Diagnosis not present

## 2014-04-23 DIAGNOSIS — K589 Irritable bowel syndrome without diarrhea: Secondary | ICD-10-CM

## 2014-04-23 DIAGNOSIS — IMO0001 Reserved for inherently not codable concepts without codable children: Secondary | ICD-10-CM

## 2014-04-23 DIAGNOSIS — M810 Age-related osteoporosis without current pathological fracture: Secondary | ICD-10-CM

## 2014-04-23 DIAGNOSIS — R569 Unspecified convulsions: Secondary | ICD-10-CM | POA: Diagnosis not present

## 2014-04-23 DIAGNOSIS — R531 Weakness: Secondary | ICD-10-CM

## 2014-04-23 DIAGNOSIS — E785 Hyperlipidemia, unspecified: Secondary | ICD-10-CM

## 2014-04-23 DIAGNOSIS — D62 Acute posthemorrhagic anemia: Secondary | ICD-10-CM

## 2014-04-23 DIAGNOSIS — K922 Gastrointestinal hemorrhage, unspecified: Secondary | ICD-10-CM

## 2014-04-23 DIAGNOSIS — F32A Depression, unspecified: Secondary | ICD-10-CM

## 2014-04-23 DIAGNOSIS — I82402 Acute embolism and thrombosis of unspecified deep veins of left lower extremity: Secondary | ICD-10-CM

## 2014-04-23 DIAGNOSIS — M4850XS Collapsed vertebra, not elsewhere classified, site unspecified, sequela of fracture: Secondary | ICD-10-CM

## 2014-04-23 DIAGNOSIS — E43 Unspecified severe protein-calorie malnutrition: Secondary | ICD-10-CM

## 2014-04-23 DIAGNOSIS — G629 Polyneuropathy, unspecified: Secondary | ICD-10-CM

## 2014-04-23 NOTE — Progress Notes (Signed)
Patient ID: [name], female   DOB: 08/09/1934, 80 y.o.   MRN: [id number]   04/23/2014  Facility:  Nursing Home Location:  [place] Room Number: 888-9 LEVEL OF CARE:  SNF (31)   Chief Complaint  Patient presents with  . Hospitalization Follow-up    Generalized weakness, Osteoporosis, hypothyroidism, adrenal insufficiency, seizures, anxiety, peripheral neuropathy, depression, hyperlipidemia, vertebral compression fracture, anemia, UGIB, irritable bowel syndrome, DVT and protein calorie malnutrition    HISTORY OF PRESENT ILLNESS:  This is a 80 year old female who was being admitted to [facility] on 04/18/14 from [facility]. She has past medical history of colitis, DVT, breast cancer, hyperlipidemia, CHF and asthma. She was admitted to the hospital with weakness and pneumonia. She was treated with IV Levaquin 7 days. Her hemoglobin dropped to 6.4 and required 3 units of blood transfusion and received IV iron. Upper and lower investigation by gastroenterology was done and showed collagenous microscopic colitis is and tubular adenoma was noted on biopsy. She has been admitted for a short-term rehabilitation.  PAST MEDICAL HISTORY:  Past Medical History  Diagnosis Date  . IBS (irritable bowel syndrome)   . Fibromyalgia   . History of DVT (deep vein thrombosis)   . History of pulmonary embolism   . Osteoporosis   . History of breast cancer LEFT BREAST DCIS  1996  . Hyperlipidemia   . 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 Breast cancer RIGHT SIDE-- DX OCT 2011    CHEMORADIATION THERAPY-- COMPLETED   . PONV (postoperative nausea and vomiting)   . Skin tear LEFT ARM COVERED W/ TEGADERM    PT STATES HX MULTIPLE SKIN TEARS -- THIN  . Thin skin SECONARDY AGE AND HX CHEMO  . Ecchymosis   . Seasonal allergies   . Frequency of urination   . Nocturia   . Anemia   . Lung mass PER DR CLANCE NOTE UNCLEAR  IF LYMPHOMA FROM BX    FOLLOWED BY DR RUBIN  . Seizures   . Unspecified hereditary and idiopathic peripheral neuropathy   . Asthma   . Fibromyalgia   . Vertebral compression fracture 5/14    L4  . CHF (congestive heart failure)     CURRENT MEDICATIONS: Reviewed per MAR/see medication list  Allergies  Allergen Reactions  . Penicillins Anaphylaxis  . Adhesive [Tape] Other (See Comments)    Blisters   . Doxycycline Nausea And Vomiting  . Morphine And Related Other (See Comments)    HALLUCINATIONS     REVIEW OF SYSTEMS:  GENERAL: no change in appetite, no fatigue, no weight changes, no fever, chills or weakness RESPIRATORY: no cough, SOB, DOE, wheezing, hemoptysis CARDIAC: no chest pain, edema or palpitations GI: no abdominal pain, diarrhea, constipation, heart burn, nausea or vomiting  PHYSICAL EXAMINATION  GENERAL: no acute distress, normal body habitus EYES: conjunctivae normal, sclerae normal, normal eye lids NECK: supple, trachea midline, no neck masses, no thyroid tenderness, no thyromegaly LYMPHATICS: no LAN in the neck, no supraclavicular LAN RESPIRATORY: breathing is even & unlabored, BS CTAB CARDIAC: RRR, no murmur,no extra heart sounds, no edema GI: abdomen soft, normal BS, no masses, no tenderness, no hepatomegaly, no splenomegaly EXTREMITIES: Able to move 4 extremities PSYCHIATRIC: the patient is alert & oriented to person, affect & behavior appropriate  LABS/RADIOLOGY: Labs reviewed: Basic Metabolic Panel:  Recent Labs  04/13/14 0531 04/15/14 0450 04/17/14 0445  NA 139 141 139  K  3.8 4.3 3.7  CL 104 104 105  CO2 28 33* 29  GLUCOSE 106* 92 95  BUN 16 14 20   CREATININE 0.99 0.90 0.84  CALCIUM 8.7 8.4 8.3*   Liver Function Tests:  Recent Labs  04/13/14 0531 04/15/14 0450 04/17/14 0445  AST 21 29 22   ALT 20 35 25  ALKPHOS 78 84 86  BILITOT 0.5 0.4 0.2*  PROT 6.1 5.8* 5.4*  ALBUMIN 2.6* 2.6* 2.7*    Recent Labs  04/09/14 2127    LIPASE 25    CBC:  Recent Labs  08/01/13 1057 04/09/14 2127  04/13/14 0531 04/15/14 0450 04/17/14 0445  WBC 8.9 15.7*  < > 7.8 6.7 6.0  NEUTROABS 6.2 11.3*  --   --  3.9  --   HGB 12.6 7.9*  < > 9.2* 10.3* 9.2*  HCT 39.1 28.8*  < > 31.7* 36.1 31.9*  MCV 85.5 78.3  < > 80.7 81.7 82.2  PLT 331 328  < > 263 258 245  < > = values in this interval not displayed.  Cardiac Enzymes:  Recent Labs  04/09/14 2127  TROPONINI <0.03    Dg Chest 2 View  04/17/2014   CLINICAL DATA:  Follow-up of pleural effusion.  EXAM: CHEST  2 VIEW  COMPARISON:  PA and lateral chest x-ray April 02, 2014  FINDINGS: The lungs are reasonably well inflated allowing for chronic elevation of the right hemidiaphragm. The small bilateral pleural effusions layering posteriorly have decreased further in size and are now quite small. The cardiac silhouette is top-normal in size but stable. The pulmonary interstitial markings are mildly increased but have improved as well. There is a hiatal hernia. There is tortuosity of the descending thoracic aorta. There is multilevel degenerative disc disease of the thoracic spine. There is stable loss of height of lower thoracic vertebral bodies.  IMPRESSION: There has been further interval decrease in the bilateral pleural effusions. Only small amounts of fluid persist which blunt the posterior costophrenic angles.   Electronically Signed   By: David  Martinique   On: 04/17/2014 09:25   Dg Chest 2 View  04/12/2014   CLINICAL DATA:  Community acquired bacterial pneumonia.  EXAM: CHEST  2 VIEW  COMPARISON:  April 09, 2014.  FINDINGS: Stable cardiomediastinal silhouette. Small hiatal hernia is noted. No pneumothorax is noted. Surgical clips are noted in left axillary region. Mild bilateral pleural effusions are noted. Elevated right hemidiaphragm is noted. No acute pulmonary disease is noted. Stable old vertebral body compression fracture is seen in lower thoracic spine.  IMPRESSION: Mild  bilateral pleural effusions.  Small hiatal hernia.   Electronically Signed   By: Marijo Conception, M.D.   On: 04/12/2014 13:57   Dg Chest 2 View  04/09/2014   CLINICAL DATA:  Shortness of breath since 04/08/2014. Patient bent over today and felt a pop in the chest. Increasing sternal pain and shortness of breath.  EXAM: CHEST  2 VIEW  COMPARISON:  04/17/2013  FINDINGS: Shallow inspiration with elevated right hemidiaphragm. Heart size and pulmonary vascularity appear normal. No focal consolidation or airspace disease. No blunting of costophrenic angles. Esophageal hiatal hernia behind the heart. Surgical clips in the left breast and axilla. Degenerative changes in the spine with post kyphoplasty densities in the upper lumbar region. Multiple endplate compression deformities consistent with osteoporosis.  IMPRESSION: Shallow inspiration with elevation of right hemidiaphragm. No focal consolidation in the lungs.   Electronically Signed   By: Oren Beckmann.D.  On: 04/09/2014 21:47   Ct Angio Chest Pe W/cm &/or Wo Cm  04/10/2014   CLINICAL DATA:  Shortness of breath and back pain. Elevated D-dimer. History of bilateral breast cancer.  EXAM: CT ANGIOGRAPHY CHEST WITH CONTRAST  TECHNIQUE: Multidetector CT imaging of the chest was performed using the standard protocol during bolus administration of intravenous contrast. Multiplanar CT image reconstructions and MIPs were obtained to evaluate the vascular anatomy.  CONTRAST:  158mL OMNIPAQUE IOHEXOL 350 MG/ML SOLN  COMPARISON:  07/31/2010  FINDINGS: Technically adequate study with good opacification of the central and segmental pulmonary arteries. No focal filling defects demonstrated. No evidence of significant pulmonary embolus.  Normal heart size. Normal caliber thoracic aorta. No evidence of aortic dissection. Coronary artery and aortic calcification. Great vessel origins are patent. Esophagus is decompressed. Moderate-sized esophageal hiatal hernia. No  significant lymphadenopathy in the chest.  Evaluation of lungs is limited due to respiratory motion artifact. There are diffuse emphysematous changes throughout the lungs with interstitial and alveolar infiltrates most likely to represent edema or diffuse pneumonitis. No pleural effusions. No pneumothorax.  Included portions of the upper abdomen demonstrate a small low-attenuation lesion in the liver, probably representing a cyst and stable since prior study. Degenerative changes in the spine. Normal No destructive bone lesions. Multiple thoracic vertebral compression fractures, likely indicating osteoporosis. These appear to have been present on a previous chest radiograph from 04/17/2013, but progression is suggested.  Review of the MIP images confirms the above findings.  IMPRESSION: No evidence of significant pulmonary embolus. Diffuse airspace and interstitial disease throughout the lungs suggesting edema or infiltration. Diffuse emphysema. Multiple thoracic vertebral compression fractures demonstrating progression since previous study.   Electronically Signed   By: Lucienne Capers M.D.   On: 04/10/2014 00:12   Dg Abd 2 Views  04/13/2014   CLINICAL DATA:  80 year old female with abdominal pain, distention and constipation.  EXAM: ABDOMEN - 2 VIEW  COMPARISON:  Prior lumbar spine radiographs including the abdomen and pelvis 10/30/2012  FINDINGS: No evidence of free air on the lateral decubitus view. The bowel gas pattern is not obstructed. Moderate volume stool present within the rectum and colon. No large rectal stool ball. The bones are diffusely osteopenic. Multiple lumbar spine compression fractures. L2 and L3 are status post augmentation. There is been some interval progression of height loss at L4. Incompletely imaged ORIF of the proximal left femur. No acute osseous abnormality identified.  IMPRESSION: 1. Moderate volume of retained stool consistent with the clinical history of constipation. No large  rectal stool ball. 2. No evidence of bowel obstruction or free air. 3. Slight interval progression of height loss at L4. Otherwise, multifocal compression fractures remain unchanged. 4. Profound osteopenia.   Electronically Signed   By: Jacqulynn Cadet M.D.   On: 04/13/2014 14:49    ASSESSMENT/PLAN:  Generalized weakness - for rehabilitation CAP - recently finished Levaquin IV x 7 days Osteoporosis - continue calcitonin 200 units/ACT 1 nasal spray into alternate nostril daily Vertebral compression fracture - continue Percocet 5/325 mg 1 tab by mouth every 6 hours when necessary for pain Hypothyroidism - continue Synthroid 88 g 1 tab by mouth daily Adrenal insufficiency - continue prednisone 10 mg by mouth daily Seizures - continue Keppra 500 mg by mouth twice a day Anxiety - mood is is stable; continue Ativan 0.5 mg by mouth daily Peripheral neuropathy - continue Neurontin 100 mg by mouth daily Depression - continue Cymbalta 60 mg by mouth daily Hyperlipidemia - continue Vytorin  10-80 mg 1 tab by mouth daily at bedtime Anemia, acute blood loss - hemoglobin 9.2; stable UGIB - continue omeprazole 20 mg 1 by mouth every morning Irritable bowel syndrome - continue Levbid 0.375 mg 12 hour 1 tab by mouth daily  DVT LLE - continue heparin 5000 units subcutaneous every 8 hours Protein calorie malnutrition, severe - Albumin 2.7; continue supplementation   Goals of care:  Short-term rehabilitation   Labs/test ordered:  none  Spent 50 minutes in patient care.    [facility], NP [employer] [phone]

## 2014-04-24 ENCOUNTER — Non-Acute Institutional Stay (SKILLED_NURSING_FACILITY): Payer: Medicare Other | Admitting: Internal Medicine

## 2014-04-24 DIAGNOSIS — R0789 Other chest pain: Secondary | ICD-10-CM

## 2014-04-24 DIAGNOSIS — D509 Iron deficiency anemia, unspecified: Secondary | ICD-10-CM | POA: Diagnosis not present

## 2014-04-24 DIAGNOSIS — R5381 Other malaise: Secondary | ICD-10-CM | POA: Diagnosis not present

## 2014-04-24 DIAGNOSIS — G620 Drug-induced polyneuropathy: Secondary | ICD-10-CM

## 2014-04-24 DIAGNOSIS — G622 Polyneuropathy due to other toxic agents: Secondary | ICD-10-CM | POA: Diagnosis not present

## 2014-04-24 DIAGNOSIS — E2740 Unspecified adrenocortical insufficiency: Secondary | ICD-10-CM

## 2014-04-24 DIAGNOSIS — K52839 Microscopic colitis, unspecified: Secondary | ICD-10-CM

## 2014-04-24 DIAGNOSIS — E46 Unspecified protein-calorie malnutrition: Secondary | ICD-10-CM

## 2014-04-24 DIAGNOSIS — IMO0001 Reserved for inherently not codable concepts without codable children: Secondary | ICD-10-CM

## 2014-04-24 DIAGNOSIS — E039 Hypothyroidism, unspecified: Secondary | ICD-10-CM

## 2014-04-24 DIAGNOSIS — T451X5A Adverse effect of antineoplastic and immunosuppressive drugs, initial encounter: Secondary | ICD-10-CM

## 2014-04-24 DIAGNOSIS — M4850XS Collapsed vertebra, not elsewhere classified, site unspecified, sequela of fracture: Secondary | ICD-10-CM

## 2014-04-24 DIAGNOSIS — G40909 Epilepsy, unspecified, not intractable, without status epilepticus: Secondary | ICD-10-CM

## 2014-04-24 DIAGNOSIS — K5289 Other specified noninfective gastroenteritis and colitis: Secondary | ICD-10-CM

## 2014-04-24 DIAGNOSIS — J189 Pneumonia, unspecified organism: Secondary | ICD-10-CM | POA: Diagnosis not present

## 2014-04-24 DIAGNOSIS — F418 Other specified anxiety disorders: Secondary | ICD-10-CM

## 2014-04-24 DIAGNOSIS — K589 Irritable bowel syndrome without diarrhea: Secondary | ICD-10-CM

## 2014-05-03 ENCOUNTER — Non-Acute Institutional Stay (SKILLED_NURSING_FACILITY): Payer: Medicare Other | Admitting: Adult Health

## 2014-05-03 ENCOUNTER — Encounter: Payer: Self-pay | Admitting: Adult Health

## 2014-05-03 DIAGNOSIS — E43 Unspecified severe protein-calorie malnutrition: Secondary | ICD-10-CM | POA: Diagnosis not present

## 2014-05-03 DIAGNOSIS — F419 Anxiety disorder, unspecified: Secondary | ICD-10-CM

## 2014-05-03 DIAGNOSIS — R531 Weakness: Secondary | ICD-10-CM

## 2014-05-03 DIAGNOSIS — IMO0001 Reserved for inherently not codable concepts without codable children: Secondary | ICD-10-CM

## 2014-05-03 DIAGNOSIS — D62 Acute posthemorrhagic anemia: Secondary | ICD-10-CM

## 2014-05-03 DIAGNOSIS — J189 Pneumonia, unspecified organism: Secondary | ICD-10-CM

## 2014-05-03 DIAGNOSIS — I82402 Acute embolism and thrombosis of unspecified deep veins of left lower extremity: Secondary | ICD-10-CM

## 2014-05-03 DIAGNOSIS — M810 Age-related osteoporosis without current pathological fracture: Secondary | ICD-10-CM

## 2014-05-03 DIAGNOSIS — K589 Irritable bowel syndrome without diarrhea: Secondary | ICD-10-CM

## 2014-05-03 DIAGNOSIS — E785 Hyperlipidemia, unspecified: Secondary | ICD-10-CM

## 2014-05-03 DIAGNOSIS — K922 Gastrointestinal hemorrhage, unspecified: Secondary | ICD-10-CM

## 2014-05-03 DIAGNOSIS — R569 Unspecified convulsions: Secondary | ICD-10-CM

## 2014-05-03 DIAGNOSIS — G629 Polyneuropathy, unspecified: Secondary | ICD-10-CM

## 2014-05-03 DIAGNOSIS — E039 Hypothyroidism, unspecified: Secondary | ICD-10-CM | POA: Diagnosis not present

## 2014-05-03 DIAGNOSIS — E2740 Unspecified adrenocortical insufficiency: Secondary | ICD-10-CM

## 2014-05-03 DIAGNOSIS — F329 Major depressive disorder, single episode, unspecified: Secondary | ICD-10-CM

## 2014-05-03 DIAGNOSIS — M4850XS Collapsed vertebra, not elsewhere classified, site unspecified, sequela of fracture: Secondary | ICD-10-CM

## 2014-05-03 DIAGNOSIS — F32A Depression, unspecified: Secondary | ICD-10-CM

## 2014-05-03 NOTE — Progress Notes (Signed)
Patient ID: [name], female   DOB: [birth year], 80 y.o.   MRN: [id number]   05/03/2014  Facility:  Nursing Home Location:  [place] Room Number: 010-2 LEVEL OF CARE:  SNF (31)   Chief Complaint  Patient presents with  . Discharge Note    Generalized weakness, Osteoporosis, hypothyroidism, adrenal insufficiency, seizures, anxiety, peripheral neuropathy, depression, hyperlipidemia, vertebral compression fracture, anemia, UGIB, irritable bowel syndrome, DVT and protein calorie malnutrition    HISTORY OF PRESENT ILLNESS:  This is a 80 year old female who is for discharge home with Home health PT, OT, Nursing and CNA. She has been admitted to [facility] on 04/18/14 from [facility]. She has past medical history of colitis, DVT, breast cancer, hyperlipidemia, CHF and asthma. She was admitted to the hospital with weakness and pneumonia. She was treated with IV Levaquin 7 days. Her hemoglobin dropped to 6.4 and required 3 units of blood transfusion and received IV iron. Upper and lower investigation by gastroenterology was done and showed collagenous microscopic colitis and tubular adenoma was noted on biopsy.   Patient was admitted to this facility for short-term rehabilitation after the patient's recent hospitalization.  Patient has completed SNF rehabilitation and therapy has cleared the patient for discharge.   PAST MEDICAL HISTORY:  Past Medical History  Diagnosis Date  . IBS (irritable bowel syndrome)   . Fibromyalgia   . History of DVT (deep vein thrombosis)   . History of pulmonary embolism   . Osteoporosis   . History of breast cancer LEFT BREAST DCIS  1996  . Hyperlipidemia   . 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 Breast cancer RIGHT SIDE-- DX OCT 2011    CHEMORADIATION THERAPY-- COMPLETED   . PONV (postoperative nausea and vomiting)   . Skin tear LEFT ARM COVERED W/ TEGADERM    PT STATES  HX MULTIPLE SKIN TEARS -- THIN  . Thin skin SECONARDY AGE AND HX CHEMO  . Ecchymosis   . Seasonal allergies   . Frequency of urination   . Nocturia   . Anemia   . Lung mass PER DR CLANCE NOTE UNCLEAR IF LYMPHOMA FROM BX    FOLLOWED BY DR RUBIN  . Seizures   . Unspecified hereditary and idiopathic peripheral neuropathy   . Asthma   . Fibromyalgia   . Vertebral compression fracture 5/14    L4  . CHF (congestive heart failure)     CURRENT MEDICATIONS: Reviewed per MAR/see medication list  Allergies  Allergen Reactions  . Penicillins Anaphylaxis  . Adhesive [Tape] Other (See Comments)    Blisters   . Doxycycline Nausea And Vomiting  . Morphine And Related Other (See Comments)    HALLUCINATIONS     REVIEW OF SYSTEMS:  GENERAL: no change in appetite, no fatigue, no weight changes, no fever, chills or weakness RESPIRATORY: no cough, SOB, DOE, wheezing, hemoptysis CARDIAC: no chest pain, or palpitations GI: no abdominal pain, diarrhea, constipation, heart burn, nausea or vomiting  PHYSICAL EXAMINATION  GENERAL: no acute distress, normal body habitus NECK: supple, trachea midline, no neck masses, no thyroid tenderness, no thyromegaly LYMPHATICS: no LAN in the neck, no supraclavicular LAN RESPIRATORY: breathing is even & unlabored, BS CTAB CARDIAC: RRR, no murmur,no extra heart sounds, BLE edema 2+ GI: abdomen soft, normal BS, no masses, no tenderness, no hepatomegaly, no splenomegaly EXTREMITIES: Able to move 4 extremities PSYCHIATRIC: the patient is alert & oriented to person, affect &  behavior appropriate  LABS/RADIOLOGY: 04/26/14  WBC 8.5 hemoglobin 11.0 hematocrit 36.3 MCV 80.1 sodium 141 potassium 3.8 glucose 8 the BUN 22 creatinine 0.77 calcium 8.9 Labs reviewed: Basic Metabolic Panel:  Recent Labs  04/13/14 0531 04/15/14 0450 04/17/14 0445  NA 139 141 139  K 3.8 4.3 3.7  CL 104 104 105  CO2 28 33* 29  GLUCOSE 106* 92 95  BUN 16 14 20   CREATININE 0.99  0.90 0.84  CALCIUM 8.7 8.4 8.3*   Liver Function Tests:  Recent Labs  04/13/14 0531 04/15/14 0450 04/17/14 0445  AST 21 29 22   ALT 20 35 25  ALKPHOS 78 84 86  BILITOT 0.5 0.4 0.2*  PROT 6.1 5.8* 5.4*  ALBUMIN 2.6* 2.6* 2.7*    Recent Labs  04/09/14 2127  LIPASE 25    CBC:  Recent Labs  08/01/13 1057 04/09/14 2127  04/13/14 0531 04/15/14 0450 04/17/14 0445  WBC 8.9 15.7*  < > 7.8 6.7 6.0  NEUTROABS 6.2 11.3*  --   --  3.9  --   HGB 12.6 7.9*  < > 9.2* 10.3* 9.2*  HCT 39.1 28.8*  < > 31.7* 36.1 31.9*  MCV 85.5 78.3  < > 80.7 81.7 82.2  PLT 331 328  < > 263 258 245  < > = values in this interval not displayed.  Cardiac Enzymes:  Recent Labs  04/09/14 2127  TROPONINI <0.03    Dg Chest 2 View  04/17/2014   CLINICAL DATA:  Follow-up of pleural effusion.  EXAM: CHEST  2 VIEW  COMPARISON:  PA and lateral chest x-ray April 02, 2014  FINDINGS: The lungs are reasonably well inflated allowing for chronic elevation of the right hemidiaphragm. The small bilateral pleural effusions layering posteriorly have decreased further in size and are now quite small. The cardiac silhouette is top-normal in size but stable. The pulmonary interstitial markings are mildly increased but have improved as well. There is a hiatal hernia. There is tortuosity of the descending thoracic aorta. There is multilevel degenerative disc disease of the thoracic spine. There is stable loss of height of lower thoracic vertebral bodies.  IMPRESSION: There has been further interval decrease in the bilateral pleural effusions. Only small amounts of fluid persist which blunt the posterior costophrenic angles.   Electronically Signed   By: David  Martinique   On: 04/17/2014 09:25   Dg Chest 2 View  04/12/2014   CLINICAL DATA:  Community acquired bacterial pneumonia.  EXAM: CHEST  2 VIEW  COMPARISON:  April 09, 2014.  FINDINGS: Stable cardiomediastinal silhouette. Small hiatal hernia is noted. No pneumothorax is  noted. Surgical clips are noted in left axillary region. Mild bilateral pleural effusions are noted. Elevated right hemidiaphragm is noted. No acute pulmonary disease is noted. Stable old vertebral body compression fracture is seen in lower thoracic spine.  IMPRESSION: Mild bilateral pleural effusions.  Small hiatal hernia.   Electronically Signed   By: Marijo Conception, M.D.   On: 04/12/2014 13:57   Dg Chest 2 View  04/09/2014   CLINICAL DATA:  Shortness of breath since 04/08/2014. Patient bent over today and felt a pop in the chest. Increasing sternal pain and shortness of breath.  EXAM: CHEST  2 VIEW  COMPARISON:  04/17/2013  FINDINGS: Shallow inspiration with elevated right hemidiaphragm. Heart size and pulmonary vascularity appear normal. No focal consolidation or airspace disease. No blunting of costophrenic angles. Esophageal hiatal hernia behind the heart. Surgical clips in the left breast and  axilla. Degenerative changes in the spine with post kyphoplasty densities in the upper lumbar region. Multiple endplate compression deformities consistent with osteoporosis.  IMPRESSION: Shallow inspiration with elevation of right hemidiaphragm. No focal consolidation in the lungs.   Electronically Signed   By: Lucienne Capers M.D.   On: 04/09/2014 21:47   Ct Angio Chest Pe W/cm &/or Wo Cm  04/10/2014   CLINICAL DATA:  Shortness of breath and back pain. Elevated D-dimer. History of bilateral breast cancer.  EXAM: CT ANGIOGRAPHY CHEST WITH CONTRAST  TECHNIQUE: Multidetector CT imaging of the chest was performed using the standard protocol during bolus administration of intravenous contrast. Multiplanar CT image reconstructions and MIPs were obtained to evaluate the vascular anatomy.  CONTRAST:  177mL OMNIPAQUE IOHEXOL 350 MG/ML SOLN  COMPARISON:  07/31/2010  FINDINGS: Technically adequate study with good opacification of the central and segmental pulmonary arteries. No focal filling defects demonstrated. No  evidence of significant pulmonary embolus.  Normal heart size. Normal caliber thoracic aorta. No evidence of aortic dissection. Coronary artery and aortic calcification. Great vessel origins are patent. Esophagus is decompressed. Moderate-sized esophageal hiatal hernia. No significant lymphadenopathy in the chest.  Evaluation of lungs is limited due to respiratory motion artifact. There are diffuse emphysematous changes throughout the lungs with interstitial and alveolar infiltrates most likely to represent edema or diffuse pneumonitis. No pleural effusions. No pneumothorax.  Included portions of the upper abdomen demonstrate a small low-attenuation lesion in the liver, probably representing a cyst and stable since prior study. Degenerative changes in the spine. Normal No destructive bone lesions. Multiple thoracic vertebral compression fractures, likely indicating osteoporosis. These appear to have been present on a previous chest radiograph from 04/17/2013, but progression is suggested.  Review of the MIP images confirms the above findings.  IMPRESSION: No evidence of significant pulmonary embolus. Diffuse airspace and interstitial disease throughout the lungs suggesting edema or infiltration. Diffuse emphysema. Multiple thoracic vertebral compression fractures demonstrating progression since previous study.   Electronically Signed   By: Lucienne Capers M.D.   On: 04/10/2014 00:12   Dg Abd 2 Views  04/13/2014   CLINICAL DATA:  80 year old female with abdominal pain, distention and constipation.  EXAM: ABDOMEN - 2 VIEW  COMPARISON:  Prior lumbar spine radiographs including the abdomen and pelvis 10/30/2012  FINDINGS: No evidence of free air on the lateral decubitus view. The bowel gas pattern is not obstructed. Moderate volume stool present within the rectum and colon. No large rectal stool ball. The bones are diffusely osteopenic. Multiple lumbar spine compression fractures. L2 and L3 are status post  augmentation. There is been some interval progression of height loss at L4. Incompletely imaged ORIF of the proximal left femur. No acute osseous abnormality identified.  IMPRESSION: 1. Moderate volume of retained stool consistent with the clinical history of constipation. No large rectal stool ball. 2. No evidence of bowel obstruction or free air. 3. Slight interval progression of height loss at L4. Otherwise, multifocal compression fractures remain unchanged. 4. Profound osteopenia.   Electronically Signed   By: Jacqulynn Cadet M.D.   On: 04/13/2014 14:49    ASSESSMENT/PLAN:  Generalized weakness - for home health PT, OT, nursing and CNA  CAP - Resolved Osteoporosis - continue calcitonin 200 units/ACT 1 nasal spray into alternate nostril daily Vertebral compression fracture - continue Percocet 5/325 mg 1 tab by mouth every 6 hours when necessary for pain Hypothyroidism - continue Synthroid 88 g 1 tab by mouth daily Adrenal insufficiency - continue prednisone 10  mg by mouth daily Seizures - continue Keppra 500 mg by mouth twice a day Anxiety - mood is is stable; continue Ativan 0.5 mg by mouth daily Peripheral neuropathy - continue Neurontin 100 mg by mouth daily Depression - continue Cymbalta 60 mg by mouth daily Hyperlipidemia - continue Vytorin 10-80 mg 1 tab by mouth daily at bedtime Anemia, acute blood loss - hemoglobin 9.2; stable UGIB - continue omeprazole 20 mg 1 by mouth every morning Irritable bowel syndrome - continue Levbid 0.375 mg 12 hour 1 tab by mouth daily  DVT LLE - continue heparin 5000 units subcutaneous every 8 hours Protein calorie malnutrition, severe - Albumin 2.7; continue supplementation    I have filled out patient's discharge paperwork and written prescriptions.  Patient will receive home health PT, OT, Nursing and CNA.  Total discharge time: Greater than 30 minutes  Discharge time involved coordination of the discharge process with social worker, nursing  staff and therapy department. Medical justification for home health services verified.   [facility], NP [employer] [phone]

## 2014-05-09 ENCOUNTER — Other Ambulatory Visit (HOSPITAL_COMMUNITY): Payer: Self-pay | Admitting: Endocrinology

## 2014-05-09 DIAGNOSIS — R0781 Pleurodynia: Secondary | ICD-10-CM

## 2014-05-09 DIAGNOSIS — E46 Unspecified protein-calorie malnutrition: Secondary | ICD-10-CM | POA: Diagnosis not present

## 2014-05-09 DIAGNOSIS — J189 Pneumonia, unspecified organism: Secondary | ICD-10-CM | POA: Diagnosis not present

## 2014-05-09 DIAGNOSIS — E785 Hyperlipidemia, unspecified: Secondary | ICD-10-CM | POA: Diagnosis not present

## 2014-05-09 DIAGNOSIS — K529 Noninfective gastroenteritis and colitis, unspecified: Secondary | ICD-10-CM | POA: Diagnosis not present

## 2014-05-09 DIAGNOSIS — E559 Vitamin D deficiency, unspecified: Secondary | ICD-10-CM | POA: Diagnosis not present

## 2014-05-09 DIAGNOSIS — I10 Essential (primary) hypertension: Secondary | ICD-10-CM | POA: Diagnosis not present

## 2014-05-09 DIAGNOSIS — E2749 Other adrenocortical insufficiency: Secondary | ICD-10-CM | POA: Diagnosis not present

## 2014-05-09 DIAGNOSIS — R0789 Other chest pain: Secondary | ICD-10-CM

## 2014-05-09 DIAGNOSIS — R269 Unspecified abnormalities of gait and mobility: Secondary | ICD-10-CM | POA: Diagnosis not present

## 2014-05-11 ENCOUNTER — Emergency Department (HOSPITAL_COMMUNITY)
Admission: EM | Admit: 2014-05-11 | Discharge: 2014-05-12 | Disposition: A | Discharge status: Home / Self Care | Payer: Medicare Other | Attending: Emergency Medicine | Admitting: Emergency Medicine

## 2014-05-11 ENCOUNTER — Emergency Department (HOSPITAL_COMMUNITY): Payer: Medicare Other

## 2014-05-11 ENCOUNTER — Encounter (HOSPITAL_COMMUNITY): Payer: Self-pay | Admitting: Emergency Medicine

## 2014-05-11 DIAGNOSIS — S51812A Laceration without foreign body of left forearm, initial encounter: Secondary | ICD-10-CM | POA: Insufficient documentation

## 2014-05-11 DIAGNOSIS — Z7952 Long term (current) use of systemic steroids: Secondary | ICD-10-CM | POA: Insufficient documentation

## 2014-05-11 DIAGNOSIS — Z794 Long term (current) use of insulin: Secondary | ICD-10-CM | POA: Diagnosis not present

## 2014-05-11 DIAGNOSIS — Z853 Personal history of malignant neoplasm of breast: Secondary | ICD-10-CM | POA: Insufficient documentation

## 2014-05-11 DIAGNOSIS — Z79899 Other long term (current) drug therapy: Secondary | ICD-10-CM | POA: Diagnosis not present

## 2014-05-11 DIAGNOSIS — Z86711 Personal history of pulmonary embolism: Secondary | ICD-10-CM | POA: Insufficient documentation

## 2014-05-11 DIAGNOSIS — K589 Irritable bowel syndrome without diarrhea: Secondary | ICD-10-CM | POA: Diagnosis not present

## 2014-05-11 DIAGNOSIS — S0101XA Laceration without foreign body of scalp, initial encounter: Secondary | ICD-10-CM | POA: Diagnosis not present

## 2014-05-11 DIAGNOSIS — Z862 Personal history of diseases of the blood and blood-forming organs and certain disorders involving the immune mechanism: Secondary | ICD-10-CM | POA: Diagnosis not present

## 2014-05-11 DIAGNOSIS — Z880 Allergy status to penicillin: Secondary | ICD-10-CM | POA: Diagnosis not present

## 2014-05-11 DIAGNOSIS — J45909 Unspecified asthma, uncomplicated: Secondary | ICD-10-CM | POA: Diagnosis not present

## 2014-05-11 DIAGNOSIS — Z87891 Personal history of nicotine dependence: Secondary | ICD-10-CM | POA: Diagnosis not present

## 2014-05-11 DIAGNOSIS — W010XXA Fall on same level from slipping, tripping and stumbling without subsequent striking against object, initial encounter: Secondary | ICD-10-CM | POA: Diagnosis not present

## 2014-05-11 DIAGNOSIS — E785 Hyperlipidemia, unspecified: Secondary | ICD-10-CM | POA: Insufficient documentation

## 2014-05-11 DIAGNOSIS — Z7982 Long term (current) use of aspirin: Secondary | ICD-10-CM | POA: Insufficient documentation

## 2014-05-11 DIAGNOSIS — I509 Heart failure, unspecified: Secondary | ICD-10-CM | POA: Insufficient documentation

## 2014-05-11 DIAGNOSIS — G40909 Epilepsy, unspecified, not intractable, without status epilepticus: Secondary | ICD-10-CM | POA: Diagnosis not present

## 2014-05-11 DIAGNOSIS — Y9389 Activity, other specified: Secondary | ICD-10-CM | POA: Diagnosis not present

## 2014-05-11 DIAGNOSIS — M797 Fibromyalgia: Secondary | ICD-10-CM | POA: Insufficient documentation

## 2014-05-11 DIAGNOSIS — Z86718 Personal history of other venous thrombosis and embolism: Secondary | ICD-10-CM | POA: Diagnosis not present

## 2014-05-11 DIAGNOSIS — Y998 Other external cause status: Secondary | ICD-10-CM | POA: Diagnosis not present

## 2014-05-11 DIAGNOSIS — G609 Hereditary and idiopathic neuropathy, unspecified: Secondary | ICD-10-CM | POA: Diagnosis not present

## 2014-05-11 DIAGNOSIS — W19XXXA Unspecified fall, initial encounter: Secondary | ICD-10-CM

## 2014-05-11 DIAGNOSIS — S0990XA Unspecified injury of head, initial encounter: Secondary | ICD-10-CM | POA: Diagnosis present

## 2014-05-11 DIAGNOSIS — Y92003 Bedroom of unspecified non-institutional (private) residence as the place of occurrence of the external cause: Secondary | ICD-10-CM | POA: Diagnosis not present

## 2014-05-11 DIAGNOSIS — S069X9A Unspecified intracranial injury with loss of consciousness of unspecified duration, initial encounter: Secondary | ICD-10-CM | POA: Diagnosis not present

## 2014-05-11 MED ORDER — LIDOCAINE HCL (PF) 1 % IJ SOLN
5.0000 mL | Freq: Once | INTRAMUSCULAR | Status: AC
  Administered 2014-05-12: 5 mL via INTRADERMAL
  Filled 2014-05-11: qty 5

## 2014-05-11 NOTE — ED Notes (Addendum)
Pt fell today (hx multiple falls) but is unable to tell me events leading up to fall. Unsure if she was dizzy or if she tripped, or lost balance. Was recently at [facility] on Heparin therapy (Coumadin does not work for patient). Patient is neurologically intact, speaking full/clear sentences. No asymmetry noted. A&O4.  Left side of face noticeably swollen. Patient is on 0.5 mg Prednisone/day. Hx multiple skin tears with skin grafts. PCP appointment soon to test for Paget's Disease. Has gross, deep laceration to left anterior arm and has small laceration on posterior head. Neither are actively bleeding. Still takes 81 mg Aspirin. Has had Tetanus shot within the year. Hx breast CA, lumpectomy. Unable to use the right arm per patient and family. No other questions/concerns. Family at bedside.

## 2014-05-11 NOTE — ED Notes (Signed)
Pt comes to ED with daughter and son and reports that she fell tonight and has a large laceration to L anterior forearm.  Pt has hx of many skin tears as skin is very fragile.  Pt alert but confused, has apparently had multiple falls recently per son and daughter.  Saline gauze covering skin tear at this time. Bleeding controlled.

## 2014-05-12 DIAGNOSIS — S0101XA Laceration without foreign body of scalp, initial encounter: Secondary | ICD-10-CM | POA: Diagnosis not present

## 2014-05-12 MED ORDER — LIDOCAINE HCL (PF) 1 % IJ SOLN
5.0000 mL | Freq: Once | INTRAMUSCULAR | Status: AC
  Administered 2014-05-12: 5 mL via INTRADERMAL
  Filled 2014-05-12: qty 5

## 2014-05-12 MED ORDER — LIDOCAINE HCL (PF) 1 % IJ SOLN
INTRAMUSCULAR | Status: AC
  Filled 2014-05-12: qty 5

## 2014-05-12 MED ORDER — BACITRACIN ZINC 500 UNIT/GM EX OINT
TOPICAL_OINTMENT | Freq: Every day | CUTANEOUS | Status: DC
  Administered 2014-05-12: 1 via TOPICAL
  Filled 2014-05-12: qty 4.5

## 2014-05-12 MED ORDER — LIDOCAINE HCL (PF) 1 % IJ SOLN
5.0000 mL | Freq: Once | INTRAMUSCULAR | Status: AC
  Administered 2014-05-12: 5 mL via INTRADERMAL

## 2014-05-12 NOTE — Discharge Instructions (Signed)
Keep the running to warm and dry. You can use triple antibiotic on the lacerations. Do not use peroxide. The staples need to be removed in one week. The sutures in your arm need to be removed in 10-12 days. Have it rechecked sooner if it appears to be getting infected with increased pain, redness, swelling, or drainage of pus. Return to the ED for any problems listed on the head injury sheet.

## 2014-05-12 NOTE — ED Provider Notes (Signed)
CSN: [id number]     Arrival date & time 05/11/14  2240 History   First MD Initiated Contact with Patient 05/11/14 2258     Chief Complaint  Patient presents with  . Fall  . Head Injury  . Arm Injury     (Consider location/radiation/quality/duration/timing/severity/associated sxs/prior Treatment) HPI  Patient reports she was in her bedroom and she was trying to call her son and then she states she doesn't know what happened. Her son states he was gone about 10 minutes going to the store. When he walked into the house she called him and just was saying his name. He found her on the floor of her bedroom. She does not recall how she fell. She was not postictal. She denies any headache. When I initially asked her where she is hurting she states "all over" which is chronic. She denies any new injury tonight except for skin tears on her left forearm and a laceration in her left posterior scalp. Patient was on heparin until one week ago. Reports she was in the hospital for 2 weeks and then was in rehabilitation for 2 weeks. She has been home for 1 week. Patient has a history of seizures. Her last seizure was about a year ago. Also about a year ago she was having episodes of syncope from orthostatic hypotension.   PCP Dr. Forde Dandy, saw him 2 days ago   Past Medical History  Diagnosis Date  . IBS (irritable bowel syndrome)   . Fibromyalgia   . History of DVT (deep vein thrombosis)   . History of pulmonary embolism   . Osteoporosis   . History of breast cancer LEFT BREAST DCIS  1996  . Hyperlipidemia   . 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 Breast cancer RIGHT SIDE-- DX OCT 2011    CHEMORADIATION THERAPY-- COMPLETED   . PONV (postoperative nausea and vomiting)   . Skin tear LEFT ARM COVERED W/ TEGADERM    PT STATES HX MULTIPLE SKIN TEARS -- THIN  . Thin skin SECONARDY AGE AND HX CHEMO  . Ecchymosis   . Seasonal allergies   . Frequency of urination   .  Nocturia   . Anemia   . Lung mass PER DR CLANCE NOTE UNCLEAR IF LYMPHOMA FROM BX    FOLLOWED BY DR RUBIN  . Seizures   . Unspecified hereditary and idiopathic peripheral neuropathy   . Asthma   . Fibromyalgia   . Vertebral compression fracture 5/14    L4  . CHF (congestive heart failure)    Past Surgical History  Procedure Laterality Date  . Total abdominal hysterectomy w/ bilateral salpingoophorectomy  1977  (APPROX)  . Cholecystectomy    . Appendectomy    . Femur fracture surgery  12-18-2010  DR BEANE    INTRAMEDULLARY NAILING LEFT INTERTROCHANTERIC -SUBTROCHANTERIC FX  . Right breast lumpectomy / removal lymph nodes x2/ removal pac  10-01-2010    CARCINOMA RIGHT BREAST  . Placement port- a- cath  02-18-2010  . Bronchoscopy  01-29-2010    W/ BX  . Tvt vaginal tape  suburethral sling   06-08-2005    SUI  . Bilateral laminectomy/ foraminotomy  01-14-2004    L4 - L5  . Breast lumpectomy  1996     LEFT BREAST DCIS   . Cataract extraction w/ intraocular lens  implant, bilateral    . Bilateral carpal tunnel release    . Tonsillectomy    . Transthoracic echocardiogram  02-25-2010    LVSF NORMAL/ EF 06-00%/ GRADE I DIASTOLIC DYSFUNCTION/ MILDLY DILATED LEFT ATRIUM  . Hardware removal  10/11/2011    Procedure: HARDWARE REMOVAL;  Surgeon: Johnn Hai, MD;  [facility];  Service: Orthopedics;  Laterality: Left;  LEFT THIGH REMOVAL OF DISTAL LOCKING SCREW NEEDS: FLORO, RADIO LUSCENT TABLE AND SCREWDRIVER FOR BIOMET ROD   . Colonoscopy    . Esophagogastroduodenoscopy N/A 03/11/2013    Procedure: ESOPHAGOGASTRODUODENOSCOPY (EGD);  Surgeon: Lear Ng, MD;  Location: [facility];  Service: Endoscopy;  Laterality: N/A;  . Esophagogastroduodenoscopy (egd) with propofol Left 04/16/2014    Procedure: ESOPHAGOGASTRODUODENOSCOPY (EGD) WITH PROPOFOL;  Surgeon: Winfield Cunas., MD;  Location[facility] ENDOSCOPY;  Service: Endoscopy;  Laterality: Left;  .  Colonoscopy with propofol Left 04/16/2014    Procedure: COLONOSCOPY WITH PROPOFOL;  Surgeon: Winfield Cunas., MD;  Location: [facility] ENDOSCOPY;  Service: Endoscopy;  Laterality: Left;   Family History  Problem Relation Age of Onset  . Emphysema Father   . CAD Father   . Ovarian cancer Mother   . Cancer Mother     cervical  . Alzheimer's disease Mother   . Hyperlipidemia Mother   . Breast cancer Maternal Grandmother   . Cancer Sister     cervical   History  Substance Use Topics  . Smoking status: Former Smoker -- 1.00 packs/day for 30 years    Types: Cigarettes    Quit date: 03/01/1978  . Smokeless tobacco: Never Used  . Alcohol Use: 4.2 - 6.0 oz/week    7-10 Glasses of wine per week   Lives at home Lives with her son Uses a walker  OB History    No data available     Review of Systems  All other systems reviewed and are negative.     Allergies  Penicillins; Adhesive; Doxycycline; and Morphine and related  Home Medications   Prior to Admission medications   Medication Sig Start Date End Date Taking? Authorizing Provider  aspirin EC 81 MG tablet Take 81 mg by mouth daily.   Yes Historical Provider, MD  dexlansoprazole (DEXILANT) 60 MG capsule Take 60 mg by mouth daily.    Yes Historical Provider, MD  DULoxetine (CYMBALTA) 60 MG capsule Take 60 mg by mouth daily after lunch.    Yes Historical Provider, MD  ergocalciferol (VITAMIN D2) 50000 UNITS capsule Take 50,000 Units by mouth once a week. Every Tuesday.   Yes Historical Provider, MD  ezetimibe-simvastatin (VYTORIN) 10-80 MG per tablet Take 1 tablet by mouth at bedtime. For hyperlipidemia   Yes Historical Provider, MD  gabapentin (NEURONTIN) 100 MG capsule Take 100 mg by mouth 3 (three) times daily.    Yes Historical Provider, MD  hyoscyamine (LEVBID) 0.375 MG 12 hr tablet Take 0.375 mg by mouth daily. For bowel spasms. 07/10/12  Yes Historical Provider, MD  levETIRAcetam (KEPPRA) 500 MG tablet TAKE 1 TABLET TWICE A  DAY 03/15/14  Yes Marcial Pacas, MD  levothyroxine (SYNTHROID, LEVOTHROID) 88 MCG tablet Take 88 mcg by mouth daily before breakfast. For hypothyroid   Yes Historical Provider, MD  LORazepam (ATIVAN) 0.5 MG tablet Take 0.5 mg by mouth every 8 (eight) hours as needed for anxiety. Anxiety 07/04/12  Yes Vivien Rota, NP  potassium chloride SA (K-DUR,KLOR-CON) 20 MEQ tablet Take 20 mEq by mouth daily.   Yes Historical Provider, MD  predniSONE (DELTASONE) 5 MG tablet Take 5 mg by mouth daily.   Yes Historical Provider,  MD  UCERIS 9 MG TB24 Take 3 capsules by mouth daily. 07/24/13  Yes Historical Provider, MD  calcitonin, salmon, (MIACALCIN/FORTICAL) 200 UNIT/ACT nasal spray Place 1 spray into alternate nostrils daily. Patient not taking: Reported on 05/11/2014 04/18/14   Reynold Bowen, MD  heparin 5000 UNIT/ML injection Inject 1 mL (5,000 Units total) into the skin every 8 (eight) hours. Patient not taking: Reported on 05/11/2014 04/18/14   Reynold Bowen, MD  oxyCODONE-acetaminophen (PERCOCET/ROXICET) 5-325 MG per tablet Take 1 tablet by mouth every 6 (six) hours as needed for moderate pain. Patient not taking: Reported on 05/11/2014 04/18/14   Reynold Bowen, MD   BP 121/66 mmHg  Pulse 91  Temp(Src) 97.7 F (36.5 C) (Oral)  Resp 17  SpO2 100%  Vital signs normal   Physical Exam  Constitutional: She is oriented to person, place, and time. She appears well-developed and well-nourished.  Non-toxic appearance. She does not appear ill. No distress.  HENT:  Head: Normocephalic and atraumatic.  Right Ear: External ear normal.  Left Ear: External ear normal.  Nose: Nose normal. No mucosal edema or rhinorrhea.  Mouth/Throat: Oropharynx is clear and moist and mucous membranes are normal. No dental abscesses or uvula swelling.  Patient is noted to have redness of her left cheek possibly from a rug burn. Her face is nontender to palpation. She has a small bruise in her mid forehead from a injury earlier this  week when she walked into a wall. She has a 2.5 cm laceration in her left posterior scalp.  Eyes: Conjunctivae and EOM are normal. Pupils are equal, round, and reactive to light.  Mild bilateral injection of her conjunctiva  Neck: Normal range of motion and full passive range of motion without pain. Neck supple.  Moves head freely without difficulty, nontender to palpation  Cardiovascular: Normal rate, regular rhythm and normal heart sounds.  Exam reveals no gallop and no friction rub.   No murmur heard. Pulmonary/Chest: Effort normal and breath sounds normal. No respiratory distress. She has no wheezes. She has no rhonchi. She has no rales. She exhibits no tenderness and no crepitus.  Abdominal: Soft. Normal appearance and bowel sounds are normal. She exhibits no distension. There is no tenderness. There is no rebound and no guarding.  Musculoskeletal: Normal range of motion. She exhibits no edema or tenderness.  Moves all extremities well. Patient has several abrasions with skin loss that is epidermal on her left forearm. She also has a linear laceration approximately  8 cm on the volar aspect of her left forearm without active bleeding. She has good range of motion of her joints.  Neurological: She is alert and oriented to person, place, and time. She has normal strength. No cranial nerve deficit.  Skin: Skin is warm, dry and intact. No rash noted. No erythema. No pallor.  Psychiatric: She has a normal mood and affect. Her speech is normal and behavior is normal. Her mood appears not anxious.  Nursing note and vitals reviewed.   ED Course  Procedures (including critical care time)  Medications  bacitracin ointment (1 application Topical Given 05/12/14 0322)  lidocaine (PF) (XYLOCAINE) 1 % injection (not administered)  lidocaine (PF) (XYLOCAINE) 1 % injection 5 mL (5 mLs Intradermal Given 05/12/14 0321)  lidocaine (PF) (XYLOCAINE) 1 % injection 5 mL (5 mLs Intradermal Given 05/12/14 0320)   lidocaine (PF) (XYLOCAINE) 1 % injection 5 mL (5 mLs Intradermal Given 05/12/14 0318)  lidocaine (PF) (XYLOCAINE) 1 % injection 5 mL (5 mLs Intradermal  Given 05/12/14 0318)    Patient was sutured by my nurse practitioner, Tysinger.  Labs Review Labs Reviewed - No data to display  Imaging Review Ct Head Wo Contrast  05/12/2014   CLINICAL DATA:  Fall with head injury and loss of consciousness. Multiple falls. Patient on blood thinners.  EXAM: CT HEAD WITHOUT CONTRAST  TECHNIQUE: Contiguous axial images were obtained from the base of the skull through the vertex without intravenous contrast.  COMPARISON:  04/14/2013  FINDINGS: No intracranial hemorrhage, mass effect, or midline shift. No hydrocephalus. The basilar cisterns are patent. No evidence of territorial infarct. No intracranial fluid collection. Generalized atrophy, unchanged. Calvarium is intact. The is improved opacification of left maxillary sinus. Remaining paranasal sinuses are well-aerated. The mastoid air cells are clear.  IMPRESSION: Mild generalized atrophy, unchanged from prior. No acute intracranial abnormality.   Electronically Signed   By: Jeb Levering M.D.   On: 05/12/2014 00:14     EKG Interpretation None      MDM   Final diagnoses:  Laceration of scalp, initial encounter  Laceration of forearm, left, initial encounter  Fall, initial encounter    Plan discharge  Rolland Porter, MD, Barbette Or, MD 05/12/14 [phone]

## 2014-05-12 NOTE — ED Provider Notes (Signed)
01:10 AM: LACERATION REPAIR Performed by: Britt Bottom Authorized by: Britt Bottom Consent: Verbal consent obtained. Risks and benefits: risks, benefits and alternatives were discussed Consent given by: patient Patient identity confirmed: provided demographic data Prepped and Draped in normal sterile fashion Wound explored  Laceration Location: left forearm  Laceration Length: 16 cm  No Foreign Bodies seen or palpated  Anesthesia: local infiltration  Local anesthetic: lidocaine 1% without epinephrine  Anesthetic total: 15 ml  Irrigation method: syringe Amount of cleaning: standard  Skin closure: 5-0 prolene, 5-0 vicryl  Number of sutures: 5 prolene, 5 vicryl  Technique: prolene: simple interrupted, Vicryl: horizontal mattress   Patient tolerance: Patient tolerated the procedure well with no immediate complications.  LACERATION REPAIR Performed by: Britt Bottom Authorized by: Britt Bottom Consent: Verbal consent obtained. Risks and benefits: risks, benefits and alternatives were discussed Consent given by: patient Patient identity confirmed: provided demographic data Prepped and Draped in normal sterile fashion Wound explored  Laceration Location: left occipital scalp  Laceration Length: 2.5 cm  No Foreign Bodies seen or palpated  Anesthesia: local infiltration  Local anesthetic: lidocaine 1% without epinephrine  Anesthetic total: 2 ml  Irrigation method: syringe Amount of cleaning: standard  Skin closure: staples  Number of staples: 2  Technique: N/A  Patient tolerance: Patient tolerated the procedure well with no immediate complications.    Britt Bottom, NP 05/12/14 [place], MD 05/13/14 [phone]

## 2014-05-24 ENCOUNTER — Encounter (HOSPITAL_COMMUNITY)
Admission: RE | Admit: 2014-05-24 | Discharge: 2014-05-24 | Disposition: A | Discharge status: Home / Self Care | Payer: Medicare Other | Source: Ambulatory Visit | Attending: Endocrinology | Admitting: Endocrinology

## 2014-05-24 ENCOUNTER — Ambulatory Visit (HOSPITAL_COMMUNITY)
Admission: RE | Admit: 2014-05-24 | Discharge: 2014-05-24 | Disposition: A | Discharge status: Home / Self Care | Payer: Medicare Other | Source: Ambulatory Visit | Attending: Endocrinology | Admitting: Endocrinology

## 2014-05-24 DIAGNOSIS — R937 Abnormal findings on diagnostic imaging of other parts of musculoskeletal system: Secondary | ICD-10-CM | POA: Diagnosis not present

## 2014-05-24 DIAGNOSIS — R0789 Other chest pain: Secondary | ICD-10-CM

## 2014-05-24 DIAGNOSIS — Z853 Personal history of malignant neoplasm of breast: Secondary | ICD-10-CM | POA: Diagnosis not present

## 2014-05-24 DIAGNOSIS — Z9181 History of falling: Secondary | ICD-10-CM | POA: Insufficient documentation

## 2014-05-24 DIAGNOSIS — R0781 Pleurodynia: Secondary | ICD-10-CM | POA: Insufficient documentation

## 2014-05-24 DIAGNOSIS — R079 Chest pain, unspecified: Secondary | ICD-10-CM | POA: Insufficient documentation

## 2014-05-24 MED ORDER — TECHNETIUM TC 99M MEDRONATE IV KIT
25.0000 | PACK | Freq: Once | INTRAVENOUS | Status: AC | PRN
  Administered 2014-05-24: 25 via INTRAVENOUS

## 2014-05-26 NOTE — Progress Notes (Signed)
Patient ID: [name], female   DOB: 1934-12-19, 80 y.o.   MRN: [id number]     [facility]   PCP: Sheela Stack, MD  Code Status: full code  Allergies  Allergen Reactions  . Penicillins Anaphylaxis  . Adhesive [Tape] Other (See Comments)    Blisters   . Doxycycline Nausea And Vomiting  . Morphine And Related Other (See Comments)    HALLUCINATIONS    Chief Complaint  Patient presents with  . New Admit To SNF     HPI:  80 year old patient is here for short term rehabilitation post hospital admission from 04/09/14-04/18/14 with generalized weakness and dyspnea. CTA was negative for pulmonary embolism. She was diagnosed with  pneumonia. She received 3 u prbc transfusion due to drop in her hemoglobin level. She also received iv iron. She was treated with antibiotics. She underwent endoscopy and colonoscopy which showed collagenous microscopic colitis and tubular adenoma.   She has past medical history of colitis, DVT,hyperlipidemia, adrenal insufficiency, osteoporosis, CHF and asthma.  She is seen in her room today. Her breathing is stable. Had a bowel movement this am. Denies any blood in it. She feels somewhat better in terms of her energy.   Review of Systems:  Constitutional: Negative for fever, chills, diaphoresis. Positive for malaise HENT: Negative for headache, congestion, nasal discharge Eyes: Negative for eye pain, blurred vision, double vision and discharge.  Respiratory: Negative for cough, shortness of breath and wheezing.   Cardiovascular: Negative for chest pain, palpitations, leg swelling.  Gastrointestinal: Negative for heartburn, nausea, vomiting, abdominal pain Genitourinary: Negative for dysuria Musculoskeletal: Negative for back pain, falls Skin: Negative for itching, sores and rash.  Neurological: Negative for dizziness, tingling, focal weakness Psychiatric/Behavioral: Negative for depression. Positive for chronic  insomnia  Past Medical History  Diagnosis Date  . IBS (irritable bowel syndrome)   . Fibromyalgia   . History of DVT (deep vein thrombosis)   . History of pulmonary embolism   . Osteoporosis   . History of breast cancer LEFT BREAST DCIS  1996  . Hyperlipidemia   . 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 Breast cancer RIGHT SIDE-- DX OCT 2011    CHEMORADIATION THERAPY-- COMPLETED   . PONV (postoperative nausea and vomiting)   . Skin tear LEFT ARM COVERED W/ TEGADERM    PT STATES HX MULTIPLE SKIN TEARS -- THIN  . Thin skin SECONARDY AGE AND HX CHEMO  . Ecchymosis   . Seasonal allergies   . Frequency of urination   . Nocturia   . Anemia   . Lung mass PER DR CLANCE NOTE UNCLEAR IF LYMPHOMA FROM BX    FOLLOWED BY DR RUBIN  . Seizures   . Unspecified hereditary and idiopathic peripheral neuropathy   . Asthma   . Fibromyalgia   . Vertebral compression fracture 5/14    L4  . CHF (congestive heart failure)    Past Surgical History  Procedure Laterality Date  . Total abdominal hysterectomy w/ bilateral salpingoophorectomy  1977  (APPROX)  . Cholecystectomy    . Appendectomy    . Femur fracture surgery  12-18-2010  DR BEANE    INTRAMEDULLARY NAILING LEFT INTERTROCHANTERIC -SUBTROCHANTERIC FX  . Right breast lumpectomy / removal lymph nodes x2/ removal pac  10-01-2010    CARCINOMA RIGHT BREAST  . Placement port- a- cath  02-18-2010  . Bronchoscopy  01-29-2010    W/ BX  . Tvt  vaginal tape  suburethral sling   06-08-2005    SUI  . Bilateral laminectomy/ foraminotomy  01-14-2004    L4 - L5  . Breast lumpectomy  1996     LEFT BREAST DCIS   . Cataract extraction w/ intraocular lens  implant, bilateral    . Bilateral carpal tunnel release    . Tonsillectomy    . Transthoracic echocardiogram  02-25-2010    LVSF NORMAL/ EF 79-89%/ GRADE I DIASTOLIC DYSFUNCTION/ MILDLY DILATED LEFT ATRIUM  . Hardware removal  10/11/2011    Procedure: HARDWARE  REMOVAL;  Surgeon: Johnn Hai, MD;  Location: [facility];  Service: Orthopedics;  Laterality: Left;  LEFT THIGH REMOVAL OF DISTAL LOCKING SCREW NEEDS: FLORO, RADIO LUSCENT TABLE AND SCREWDRIVER FOR BIOMET ROD   . Colonoscopy    . Esophagogastroduodenoscopy N/A 03/11/2013    Procedure: ESOPHAGOGASTRODUODENOSCOPY (EGD);  Surgeon: Lear Ng, MD;  Location: [facility];  Service: Endoscopy;  Laterality: N/A;  . Esophagogastroduodenoscopy (egd) with propofol Left 04/16/2014    Procedure: ESOPHAGOGASTRODUODENOSCOPY (EGD) WITH PROPOFOL;  Surgeon: Winfield Cunas., MD;  Location: [facility] ENDOSCOPY;  Service: Endoscopy;  Laterality: Left;  . Colonoscopy with propofol Left 04/16/2014    Procedure: COLONOSCOPY WITH PROPOFOL;  Surgeon: Winfield Cunas., MD;  Location: [facility] ENDOSCOPY;  Service: Endoscopy;  Laterality: Left;   Social History:   reports that she quit smoking about 36 years ago. Her smoking use included Cigarettes. She has a 30 pack-year smoking history. She has never used smokeless tobacco. She reports that she drinks about 4.2 - 6.0 oz of alcohol per week. She reports that she does not use illicit drugs.  Family History  Problem Relation Age of Onset  . Emphysema Father   . CAD Father   . Ovarian cancer Mother   . Cancer Mother     cervical  . Alzheimer's disease Mother   . Hyperlipidemia Mother   . Breast cancer Maternal Grandmother   . Cancer Sister     cervical    Medications: Patient's Medications  New Prescriptions   No medications on file  Previous Medications   ASPIRIN EC 81 MG TABLET    Take 81 mg by mouth daily.   CALCITONIN, SALMON, (MIACALCIN/FORTICAL) 200 UNIT/ACT NASAL SPRAY    Place 1 spray into alternate nostrils daily.   DEXLANSOPRAZOLE (DEXILANT) 60 MG CAPSULE    Take 60 mg by mouth daily.    DULOXETINE (CYMBALTA) 60 MG CAPSULE    Take 60 mg by mouth daily after lunch.    ERGOCALCIFEROL (VITAMIN D2) 50000 UNITS CAPSULE    Take  50,000 Units by mouth once a week. Every Tuesday.   EZETIMIBE-SIMVASTATIN (VYTORIN) 10-80 MG PER TABLET    Take 1 tablet by mouth at bedtime. For hyperlipidemia   GABAPENTIN (NEURONTIN) 100 MG CAPSULE    Take 100 mg by mouth 3 (three) times daily.    HEPARIN 5000 UNIT/ML INJECTION    Inject 1 mL (5,000 Units total) into the skin every 8 (eight) hours.   HYOSCYAMINE (LEVBID) 0.375 MG 12 HR TABLET    Take 0.375 mg by mouth daily. For bowel spasms.   LEVETIRACETAM (KEPPRA) 500 MG TABLET    TAKE 1 TABLET TWICE A DAY   LEVOTHYROXINE (SYNTHROID, LEVOTHROID) 88 MCG TABLET    Take 88 mcg by mouth daily before breakfast. For hypothyroid   LORAZEPAM (ATIVAN) 0.5 MG TABLET    Take 0.5 mg by mouth every 8 (eight) hours as needed  for anxiety. Anxiety   OXYCODONE-ACETAMINOPHEN (PERCOCET/ROXICET) 5-325 MG PER TABLET    Take 1 tablet by mouth every 6 (six) hours as needed for moderate pain.   POTASSIUM CHLORIDE SA (K-DUR,KLOR-CON) 20 MEQ TABLET    Take 20 mEq by mouth daily.   PREDNISONE (DELTASONE) 5 MG TABLET    Take 5 mg by mouth daily.   UCERIS 9 MG TB24    Take 3 capsules by mouth daily.  Modified Medications   No medications on file  Discontinued Medications   No medications on file     Physical Exam: Filed Vitals:   04/24/14 1649  BP: 146/84  Pulse: 88  Temp: 97 F (36.1 C)  Resp: 16  SpO2: 94%    General- elderly female, in no acute distress Head- normocephalic, atraumatic Throat- moist mucus membrane Eyes- PERRLA, EOMI, no pallor, no icterus, no discharge, normal conjunctiva, normal sclera Neck- no cervical lymphadenopathy Chest- nleft sided reproducible chest pain Cardiovascular- normal B1,D1, systolic murmur present, palpable dorsalis pedis, trace leg edema Respiratory- bilateral clear to auscultation, no wheeze, no rhonchi, no crackles, no use of accessory muscles Abdomen- bowel sounds present, soft, non tender Musculoskeletal- able to move all 4 extremities, generalized  weakness Neurological- no focal deficit Skin- warm and dry, multiple skin tears Psychiatry- alert and oriented with normal mood and affect    Labs reviewed: Basic Metabolic Panel:  Recent Labs  04/13/14 0531 04/15/14 0450 04/17/14 0445  NA 139 141 139  K 3.8 4.3 3.7  CL 104 104 105  CO2 28 33* 29  GLUCOSE 106* 92 95  BUN 16 14 20   CREATININE 0.99 0.90 0.84  CALCIUM 8.7 8.4 8.3*   Liver Function Tests:  Recent Labs  04/13/14 0531 04/15/14 0450 04/17/14 0445  AST 21 29 22   ALT 20 35 25  ALKPHOS 78 84 86  BILITOT 0.5 0.4 0.2*  PROT 6.1 5.8* 5.4*  ALBUMIN 2.6* 2.6* 2.7*    Recent Labs  04/09/14 2127  LIPASE 25   No results for input(s): AMMONIA in the last 8760 hours. CBC:  Recent Labs  08/01/13 1057 04/09/14 2127  04/13/14 0531 04/15/14 0450 04/17/14 0445  WBC 8.9 15.7*  < > 7.8 6.7 6.0  NEUTROABS 6.2 11.3*  --   --  3.9  --   HGB 12.6 7.9*  < > 9.2* 10.3* 9.2*  HCT 39.1 28.8*  < > 31.7* 36.1 31.9*  MCV 85.5 78.3  < > 80.7 81.7 82.2  PLT 331 328  < > 263 258 245  < > = values in this interval not displayed. Cardiac Enzymes:  Recent Labs  04/09/14 2127  TROPONINI <0.03    Assessment/Plan  physical deconditioning Will have her work with physical therapy and occupational therapy team to help with gait training and muscle strengthening exercises.fall precautions. Skin care. Encourage to be out of bed.   CAP Finished her course of levaquin, clinically better, breathing stable. Monitor clinically  Iron def anemia S/p 3 u prbc transfusion and iv iron infusion. Monitor h&h  Microscopic colitis Stable, no abdominal pain, continue PPI, monitor clinically.  Protein calorie malnutrition Monitor weight, encourage po intake. Continue procel supplement  Musculoskeletal chest pain Reproducible in nature. Continue prednisone for now and advised to take prn tylenol  Adrenal insufficiency On chronic prednisone 10 mg daily, continue this  Vertebral  compression fracture Pain under control. continue Percocet 5/325 mg 1 tab q6h prn for pain. On sq heparin for dvt prophylaxis. Continue calcitonin and  vitamin d  IBS Stable, continue levbid  Hypothyroidism continue Synthroid 88 mcg daily  Depression with anxiety Stable. Continue cymbalta 60 mg daily and ativan 0.5 mg daily for now    Seizures continue Keppra 500 mg bid  Peripheral neuropathy from chemotherapy Stable on current regimen of gabapentin    Goals of care: short term rehabilitation   Family/ staff Communication: reviewed care plan with patient and nursing supervisor    Blanchie Serve, MD  [place] [phone] (Monday-Friday 8 am - 5 pm) [phone] (afterhours)

## 2014-05-31 ENCOUNTER — Encounter (HOSPITAL_BASED_OUTPATIENT_CLINIC_OR_DEPARTMENT_OTHER): Payer: Medicare Other | Attending: Internal Medicine

## 2014-05-31 DIAGNOSIS — S51812A Laceration without foreign body of left forearm, initial encounter: Secondary | ICD-10-CM | POA: Diagnosis present

## 2014-05-31 DIAGNOSIS — W19XXXA Unspecified fall, initial encounter: Secondary | ICD-10-CM | POA: Insufficient documentation

## 2014-06-07 ENCOUNTER — Other Ambulatory Visit: Payer: Self-pay | Admitting: Endocrinology

## 2014-06-07 ENCOUNTER — Ambulatory Visit
Admission: RE | Admit: 2014-06-07 | Discharge: 2014-06-07 | Disposition: A | Discharge status: Home / Self Care | Payer: Medicare Other | Source: Ambulatory Visit | Attending: Endocrinology | Admitting: Endocrinology

## 2014-06-07 DIAGNOSIS — R0789 Other chest pain: Secondary | ICD-10-CM

## 2014-06-07 DIAGNOSIS — C801 Malignant (primary) neoplasm, unspecified: Secondary | ICD-10-CM

## 2014-06-24 DIAGNOSIS — R072 Precordial pain: Secondary | ICD-10-CM | POA: Diagnosis not present

## 2014-06-24 DIAGNOSIS — R609 Edema, unspecified: Secondary | ICD-10-CM | POA: Diagnosis not present

## 2014-06-24 DIAGNOSIS — Z8672 Personal history of thrombophlebitis: Secondary | ICD-10-CM | POA: Diagnosis not present

## 2014-06-24 DIAGNOSIS — E559 Vitamin D deficiency, unspecified: Secondary | ICD-10-CM | POA: Diagnosis not present

## 2014-06-24 DIAGNOSIS — D649 Anemia, unspecified: Secondary | ICD-10-CM | POA: Diagnosis not present

## 2014-06-24 DIAGNOSIS — Z6825 Body mass index (BMI) 25.0-25.9, adult: Secondary | ICD-10-CM | POA: Diagnosis not present

## 2014-06-24 DIAGNOSIS — R0781 Pleurodynia: Secondary | ICD-10-CM | POA: Diagnosis not present

## 2014-08-22 ENCOUNTER — Ambulatory Visit: Payer: Medicare Other | Admitting: Oncology

## 2014-08-22 ENCOUNTER — Other Ambulatory Visit: Payer: Medicare Other

## 2014-10-23 ENCOUNTER — Encounter (HOSPITAL_BASED_OUTPATIENT_CLINIC_OR_DEPARTMENT_OTHER): Payer: Medicare Other | Attending: Surgery

## 2014-10-23 DIAGNOSIS — Z79899 Other long term (current) drug therapy: Secondary | ICD-10-CM | POA: Insufficient documentation

## 2014-10-23 DIAGNOSIS — E785 Hyperlipidemia, unspecified: Secondary | ICD-10-CM | POA: Insufficient documentation

## 2014-10-23 DIAGNOSIS — G629 Polyneuropathy, unspecified: Secondary | ICD-10-CM | POA: Insufficient documentation

## 2014-10-23 DIAGNOSIS — Z853 Personal history of malignant neoplasm of breast: Secondary | ICD-10-CM | POA: Insufficient documentation

## 2014-10-23 DIAGNOSIS — E2740 Unspecified adrenocortical insufficiency: Secondary | ICD-10-CM | POA: Insufficient documentation

## 2014-10-23 DIAGNOSIS — J449 Chronic obstructive pulmonary disease, unspecified: Secondary | ICD-10-CM | POA: Diagnosis not present

## 2014-10-23 DIAGNOSIS — M1990 Unspecified osteoarthritis, unspecified site: Secondary | ICD-10-CM | POA: Insufficient documentation

## 2014-10-23 DIAGNOSIS — Z7952 Long term (current) use of systemic steroids: Secondary | ICD-10-CM | POA: Diagnosis not present

## 2014-10-23 DIAGNOSIS — I10 Essential (primary) hypertension: Secondary | ICD-10-CM | POA: Diagnosis not present

## 2014-10-23 DIAGNOSIS — Z7982 Long term (current) use of aspirin: Secondary | ICD-10-CM | POA: Insufficient documentation

## 2014-10-23 DIAGNOSIS — L97821 Non-pressure chronic ulcer of other part of left lower leg limited to breakdown of skin: Secondary | ICD-10-CM | POA: Insufficient documentation

## 2014-10-23 DIAGNOSIS — L97221 Non-pressure chronic ulcer of left calf limited to breakdown of skin: Secondary | ICD-10-CM | POA: Diagnosis present

## 2014-10-23 DIAGNOSIS — Z86718 Personal history of other venous thrombosis and embolism: Secondary | ICD-10-CM | POA: Diagnosis not present

## 2014-10-23 DIAGNOSIS — J45909 Unspecified asthma, uncomplicated: Secondary | ICD-10-CM | POA: Diagnosis not present

## 2014-10-23 DIAGNOSIS — I4891 Unspecified atrial fibrillation: Secondary | ICD-10-CM | POA: Diagnosis not present

## 2014-10-23 DIAGNOSIS — I739 Peripheral vascular disease, unspecified: Secondary | ICD-10-CM | POA: Diagnosis not present

## 2014-10-30 DIAGNOSIS — L97221 Non-pressure chronic ulcer of left calf limited to breakdown of skin: Secondary | ICD-10-CM | POA: Diagnosis not present

## 2014-11-05 ENCOUNTER — Ambulatory Visit (HOSPITAL_BASED_OUTPATIENT_CLINIC_OR_DEPARTMENT_OTHER): Payer: Medicare Other | Admitting: Oncology

## 2014-11-05 ENCOUNTER — Telehealth: Payer: Self-pay | Admitting: Oncology

## 2014-11-05 ENCOUNTER — Other Ambulatory Visit (HOSPITAL_BASED_OUTPATIENT_CLINIC_OR_DEPARTMENT_OTHER): Payer: Medicare Other

## 2014-11-05 VITALS — BP 135/76 | HR 86 | Temp 97.8°F | Resp 18 | Ht 66.0 in | Wt 155.2 lb

## 2014-11-05 DIAGNOSIS — G629 Polyneuropathy, unspecified: Secondary | ICD-10-CM

## 2014-11-05 DIAGNOSIS — R197 Diarrhea, unspecified: Secondary | ICD-10-CM

## 2014-11-05 DIAGNOSIS — K589 Irritable bowel syndrome without diarrhea: Secondary | ICD-10-CM

## 2014-11-05 DIAGNOSIS — F411 Generalized anxiety disorder: Secondary | ICD-10-CM

## 2014-11-05 DIAGNOSIS — D0512 Intraductal carcinoma in situ of left breast: Secondary | ICD-10-CM

## 2014-11-05 DIAGNOSIS — Z853 Personal history of malignant neoplasm of breast: Secondary | ICD-10-CM

## 2014-11-05 DIAGNOSIS — I82403 Acute embolism and thrombosis of unspecified deep veins of lower extremity, bilateral: Secondary | ICD-10-CM

## 2014-11-05 DIAGNOSIS — K922 Gastrointestinal hemorrhage, unspecified: Secondary | ICD-10-CM

## 2014-11-05 DIAGNOSIS — C50411 Malignant neoplasm of upper-outer quadrant of right female breast: Secondary | ICD-10-CM

## 2014-11-05 DIAGNOSIS — M8088XA Other osteoporosis with current pathological fracture, vertebra(e), initial encounter for fracture: Secondary | ICD-10-CM

## 2014-11-05 LAB — CBC & DIFF AND RETIC
BASO%: 0.3 % (ref 0.0–2.0)
Basophils Absolute: 0 10*3/uL (ref 0.0–0.1)
EOS%: 1.5 % (ref 0.0–7.0)
Eosinophils Absolute: 0.1 10*3/uL (ref 0.0–0.5)
HCT: 45.8 % (ref 34.8–46.6)
HGB: 14.8 g/dL (ref 11.6–15.9)
Immature Retic Fract: 11.3 % — ABNORMAL HIGH (ref 1.60–10.00)
LYMPH%: 28.2 % (ref 14.0–49.7)
MCH: 31 pg (ref 25.1–34.0)
MCHC: 32.3 g/dL (ref 31.5–36.0)
MCV: 95.8 fL (ref 79.5–101.0)
MONO#: 0.8 10*3/uL (ref 0.1–0.9)
MONO%: 8.3 % (ref 0.0–14.0)
NEUT%: 61.7 % (ref 38.4–76.8)
NEUTROS ABS: 5.6 10*3/uL (ref 1.5–6.5)
Platelets: 249 10*3/uL (ref 145–400)
RBC: 4.78 10*6/uL (ref 3.70–5.45)
RDW: 14.5 % (ref 11.2–14.5)
Retic %: 1.5 % (ref 0.70–2.10)
Retic Ct Abs: 71.7 10*3/uL (ref 33.70–90.70)
WBC: 9 10*3/uL (ref 3.9–10.3)
lymph#: 2.6 10*3/uL (ref 0.9–3.3)
nRBC: 0 % (ref 0–0)

## 2014-11-05 NOTE — Telephone Encounter (Signed)
Gave avs & calendar for August 2017 °

## 2014-11-05 NOTE — Progress Notes (Signed)
[place]  [phone] [phone]  OFFICE PROGRESS NOTE   ID: Chyanna Flock [place]   DOB: 1935-02-04  MR#: [id number]  PJK#:[id number]   PCP: Sheela Stack, MD SU: Neldon Mc, M.D. RAD ONC: Arloa Koh, M.D. NEURO:  Marcial Pacas, M.D., Lizbeth Bark M.D., Doreene Nest MD   HISTORY OF PRESENT ILLNESS: From Dr. Collier Salina Rubin's new patient evaluation note dated 01/05/2010:  "This woman has been in excellent health.  She has undergone annual screening mammography.  She underwent a routine screening mammogram on 12/11/2009, this showed a possible mass, right breast.  The patient was contacted and underwent a diagnostic mammogram and right breast ultrasound on 12/19/2009 at the time of the imaging tests, physical exam did not reveal any abnormalities.  There is focal tenderness at the 10 o' clock, 2 cm from the right nipple.  Ultrasound showed a hypoechoic mass with a microlobulated border at 10 o' clock, 2 cm from the nipple measuring 2 x 1.2 x 1.8 cm.  Ultrasound of that axilla did not reveal abnormal lymph nodes.  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  (The case [id number]).  The estrogen receptors were negative, progesterone receptor was negative, proliferative index 96% and the HER-2 had a ratio of 1.31.  An MRI scan was done 12/29/2009, which showed enhancing 1.8 x 2.3 x 2.4 cm mass in the left and upper quadrant.  No enlarged lymph nodes were seen.  Some sub-centimeter lesions were seen in the liver, likely representing cyst.  The patient has elected to undergo genetic testing and is awaiting those results prior to making a final decision regarding surgical therapy.  In addition, she is here to discuss neoadjuvant therapy.  This patient has a history of DCIS involving the left breast.  This is associated with microinvasion.  This was diagnosed in 1996.  She had left  central lumpectomy and axillary node dissection that was negative.  She was seen by Dr. Noreene Filbert, and underwent radiation therapy to the breast.  She has not received any adjuvant hormonal therapy.  We do not have any other details regarding the specific pathological diagnosis."    Her subsequent history is as detailed below.   INTERVAL HISTORY: Pleas Koch returns today for followup of her breast cancer. She is just "getting through the crack", but breathing better , completing a course of antibiotics. Marland Kitchen REVIEW OF SYSTEMS: She Has significant back pain, and had a bone scan in March which showed some compression fractures (further identified through plain films that followed in April). She is now on zolendronate. She still has a lot of back pain. She has diarrhea secondary to IBS. Sometimes she has blurred vision. She can have palpitations, ankle swelling, significant problems with shortness of breath, a dry cough, stress urinary incontinence, and difficulty walking. She has some anxiety and depression but overall she is feeling "better than last year". A detailed review of systems today was otherwise stablee.   PAST MEDICAL HISTORY: Past Medical History  Diagnosis Date  . IBS (irritable bowel syndrome)   . Fibromyalgia   . History of DVT (deep vein thrombosis)   . History of pulmonary embolism   . Osteoporosis   . History of breast cancer LEFT BREAST DCIS  1996  . Hyperlipidemia   . 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 Breast cancer RIGHT SIDE-- DX OCT 2011    CHEMORADIATION THERAPY--  COMPLETED   . PONV (postoperative nausea and vomiting)   . Skin tear LEFT ARM COVERED W/ TEGADERM    PT STATES HX MULTIPLE SKIN TEARS -- THIN  . Thin skin SECONARDY AGE AND HX CHEMO  . Ecchymosis   . Seasonal allergies   . Frequency of urination   . Nocturia   . Anemia   . Lung mass PER DR CLANCE NOTE UNCLEAR IF LYMPHOMA FROM BX    FOLLOWED BY DR RUBIN  .  Seizures   . Unspecified hereditary and idiopathic peripheral neuropathy   . Asthma   . Fibromyalgia   . Vertebral compression fracture 5/14    L4  . CHF (congestive heart failure)     PAST SURGICAL HISTORY: Past Surgical History  Procedure Laterality Date  . Total abdominal hysterectomy w/ bilateral salpingoophorectomy  1977  (APPROX)  . Cholecystectomy    . Appendectomy    . Femur fracture surgery  12-18-2010  DR BEANE    INTRAMEDULLARY NAILING LEFT INTERTROCHANTERIC -SUBTROCHANTERIC FX  . Right breast lumpectomy / removal lymph nodes x2/ removal pac  10-01-2010    CARCINOMA RIGHT BREAST  . Placement port- a- cath  02-18-2010  . Bronchoscopy  01-29-2010    W/ BX  . Tvt vaginal tape  suburethral sling   06-08-2005    SUI  . Bilateral laminectomy/ foraminotomy  01-14-2004    L4 - L5  . Breast lumpectomy  1996     LEFT BREAST DCIS   . Cataract extraction w/ intraocular lens  implant, bilateral    . Bilateral carpal tunnel release    . Tonsillectomy    . Transthoracic echocardiogram  02-25-2010    LVSF NORMAL/ EF 70-96%/ GRADE I DIASTOLIC DYSFUNCTION/ MILDLY DILATED LEFT ATRIUM  . Hardware removal  10/11/2011    Procedure: HARDWARE REMOVAL;  Surgeon: Johnn Hai, MD;  Location: [facility];  Service: Orthopedics;  Laterality: Left;  LEFT THIGH REMOVAL OF DISTAL LOCKING SCREW NEEDS: FLORO, RADIO LUSCENT TABLE AND SCREWDRIVER FOR BIOMET ROD   . Colonoscopy    . Esophagogastroduodenoscopy N/A 03/11/2013    Procedure: ESOPHAGOGASTRODUODENOSCOPY (EGD);  Surgeon: Lear Ng, MD;  Location: [facility];  Service: Endoscopy;  Laterality: N/A;  . Esophagogastroduodenoscopy (egd) with propofol Left 04/16/2014    Procedure: ESOPHAGOGASTRODUODENOSCOPY (EGD) WITH PROPOFOL;  Surgeon: Winfield Cunas., MD;  Location: [facility] END[facility]COPY;  Service: Endoscopy;  Laterality: Left;  . Colonoscopy with propofol Left 04/16/2014    Procedure: COLONOSCOPY WITH PROPOFOL;   Surgeon: Winfield Cunas., MD;  Location: [facility] ENDOSCOPY;  Service: Endoscopy;  Laterality: Left;  Includes history of hysterectomy 30 years ago with oophorectomy.  This is done by Dr. Maryruth Eve by her report.  She had a large ovarian mass, which was diagnosed as possibly a borderline tumor.  She had a cholecystectomy 20 years ago, appendectomy 50 years ago.  FAMILY HISTORY Family History  Problem Relation Age of Onset  . Emphysema Father   . CAD Father   . Ovarian cancer Mother   . Cancer Mother     cervical  . Alzheimer's disease Mother   . Hyperlipidemia Mother   . Breast cancer Maternal Grandmother   . Cancer Sister     cervical  Both parents deceased.  Father died about age 38 of unknown causes.  Mother died over 5 years ago, the patient reports she had ovarian cancer as did the patient's sister with both ovarian and breast cancer.  The patient has a  maternal aunt who had ovarian cancer.  Five maternal aunts, one had ovarian cancer and one had breast cancer, a niece had breast cancer.  GYNECOLOGIC HISTORY: Gravida 2, para 2, menarche age 23, menopause at the time of hysterectomy.  She does not recall her last bone density test.  SOCIAL HISTORY: The patient has been married for over 55 years.  She  and her  husband [name] have two adult sons, 5 grandsons, and 2 great grandchildren.  The patient and her husband are both retired. 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  She has family that lives in [place], [place].  Her daughter is a Marine scientist who works for [employer].  In her spare time the patient enjoys gardening, swimming, being with her family, and going to church.   ADVANCED DIRECTIVES: Not on file  HEALTH MAINTENANCE: Social History  Substance Use Topics  . Smoking status: Former Smoker -- 1.00 packs/day for 30 years    Types: Cigarettes    Quit date: 03/01/1978  . Smokeless tobacco: Never Used  . Alcohol Use: 4.2  - 6.0 oz/week    7-10 Glasses of wine per week    Colonoscopy:  PAP:  Bone density:  (osteoporosis). Lipid panel: Not on file  Allergies  Allergen Reactions  . Penicillins Anaphylaxis  . Adhesive [Tape] Other (See Comments)    Blisters   . Doxycycline Nausea And Vomiting  . Morphine And Related Other (See Comments)    HALLUCINATIONS    Current Outpatient Prescriptions  Medication Sig Dispense Refill  . aspirin EC 81 MG tablet Take 81 mg by mouth daily.    . calcitonin, salmon, (MIACALCIN/FORTICAL) 200 UNIT/ACT nasal spray Place 1 spray into alternate nostrils daily. (Patient not taking: Reported on 05/11/2014) 3.7 mL 12  . dexlansoprazole (DEXILANT) 60 MG capsule Take 60 mg by mouth daily.     . DULoxetine (CYMBALTA) 60 MG capsule Take 60 mg by mouth daily after lunch.     . ergocalciferol (VITAMIN D2) 50000 UNITS capsule Take 50,000 Units by mouth once a week. Every Tuesday.    . ezetimibe-simvastatin (VYTORIN) 10-80 MG per tablet Take 1 tablet by mouth at bedtime. For hyperlipidemia    . gabapentin (NEURONTIN) 100 MG capsule Take 100 mg by mouth 3 (three) times daily.     . heparin 5000 UNIT/ML injection Inject 1 mL (5,000 Units total) into the skin every 8 (eight) hours. (Patient not taking: Reported on 05/11/2014) 1 mL 0  . hyoscyamine (LEVBID) 0.375 MG 12 hr tablet Take 0.375 mg by mouth daily. For bowel spasms.    Marland Kitchen levETIRAcetam (KEPPRA) 500 MG tablet TAKE 1 TABLET TWICE A DAY 60 tablet 0  . levothyroxine (SYNTHROID, LEVOTHROID) 88 MCG tablet Take 88 mcg by mouth daily before breakfast. For hypothyroid    . LORazepam (ATIVAN) 0.5 MG tablet Take 0.5 mg by mouth every 8 (eight) hours as needed for anxiety. Anxiety    . oxyCODONE-acetaminophen (PERCOCET/ROXICET) 5-325 MG per tablet Take 1 tablet by mouth every 6 (six) hours as needed for moderate pain. (Patient not taking: Reported on 05/11/2014) 30 tablet 0  . potassium chloride SA (K-DUR,KLOR-CON) 20 MEQ tablet Take 20 mEq by  mouth daily.    . predniSONE (DELTASONE) 5 MG tablet Take 5 mg by mouth daily.    Marland Kitchen UCERIS 9 MG TB24 Take 3 capsules by mouth daily.     No current facility-administered medications for this visit.   Facility-Administered Medications Ordered  in Other Visits  Medication Dose Route Frequency Provider Last Rate Last Dose  . fentaNYL (SUBLIMAZE) injection 25-50 mcg  25-50 mcg Intravenous Q5 min PRN Rod Mae, MD      . lactated ringers infusion   Intravenous Continuous Rod Mae, MD         Objective: older white woman walking with a cane Filed Vitals:   11/05/14 1035  BP: 135/76  Pulse: 86  Temp: 97.8 F (36.6 C)  Resp: 18     Body mass index is 25.06 kg/(m^2).      ECOG FS:  2           Sclerae unicteric, EOMs intact Oropharynx clear,  Slightly dry No cervical or supraclavicular adenopathy Lungs no rales or rhonchi Heart regular rate and rhythm Abd soft, nontender, positive bowel sounds MSK  Diffuse but mild spinal tenderness, no upper extremity lymphedema Neuro: nonfocal, well oriented, appropriate affect Breasts:  The right breast is status post central lumpectomy. There is no evidence of local recurrence. The right axilla is benign. The left breast is also status post lumpectomy. There is no evidence of local recurrence. The left axilla is benign.   LAB RESULTS: Lab Results  Component Value Date   WBC 9.0 11/05/2014   NEUTROABS 5.6 11/05/2014   HGB 14.8 11/05/2014   HCT 45.8 11/05/2014   MCV 95.8 11/05/2014   PLT 249 11/05/2014      Chemistry      Component Value Date/Time   NA 139 04/17/2014 0445   NA 143 08/01/2013 1057   K 3.7 04/17/2014 0445   K 3.3* 08/01/2013 1057   CL 105 04/17/2014 0445   CL 102 07/04/2012 1456   CO2 29 04/17/2014 0445   CO2 19* 08/01/2013 1057   BUN 20 04/17/2014 0445   BUN 14.7 08/01/2013 1057   CREATININE 0.84 04/17/2014 0445   CREATININE 0.8 08/01/2013 1057      Component Value Date/Time   CALCIUM 8.3* 04/17/2014  0445   CALCIUM 9.1 08/01/2013 1057   ALKPHOS 86 04/17/2014 0445   ALKPHOS 111 08/01/2013 1057   AST 22 04/17/2014 0445   AST 12 08/01/2013 1057   ALT 25 04/17/2014 0445   ALT 9 08/01/2013 1057   BILITOT 0.2* 04/17/2014 0445   BILITOT 0.34 08/01/2013 1057       Lab Results  Component Value Date   LABCA2 35 01/05/2010    Urinalysis    Component Value Date/Time   COLORURINE YELLOW 04/10/2014 2045   APPEARANCEUR CLOUDY* 04/10/2014 2045   LABSPEC 1.021 04/10/2014 2045   PHURINE 6.0 04/10/2014 2045   GLUCOSEU NEGATIVE 04/10/2014 2045   HGBUR TRACE* 04/10/2014 2045   BILIRUBINUR NEGATIVE 04/10/2014 2045   KETONESUR NEGATIVE 04/10/2014 2045   PROTEINUR NEGATIVE 04/10/2014 2045   UROBILINOGEN 0.2 04/10/2014 2045   NITRITE POSITIVE* 04/10/2014 2045   LEUKOCYTESUR MODERATE* 04/10/2014 2045    STUDIES: No results found.  ASSESSMENT: 80 y.o. BRCA negative [place] woman:  1.  History of left breast DCIS in 1996  2.  Status post right needle core biopsy on 12/19/2009 which showed a high-grade invasive ductal carcinoma with papillary features and high-grade DCIS, ER 0%, PR 0%, Ki-67 96%, HER-2/neu negative by CISH with a ratio of 1.31.  3.  Status post bilateral breast MRI on 12/29/2009 which showed an enhancing 1.8 x 2.3 x 2.4 cm mass in the upper outer quadrant of the right breast.  No additional masses were seen in the right breast.  There were no enlarged axillary or internal mammary adenopathy.  No abnormal enhancement was seen in the left breast.  4.  The patient underwent genetic counseling and testing and had a negative BRCA1 and BRCA2 gene mutation letter sent to her dated 01/09/2010.  5.  Status post right lower lobe pulmonary biopsy on 02/10/2010 that was negative for malignancy.  6.  Status post 2-D echocardiogram on 02/25/2010 which showed an ejection fraction of 55% to 60%.  7.  Status post neoadjuvant chemotherapy with dose dense FEC x 3 cycles from 03/12/2010  through 04/17/2010 with Neulasta support.  8.  Status post neoadjuvant chemotherapy with FEC x 1 dose on 05/08/2010 with Neulasta support.  9.  Status post neoadjuvant chemotherapy with Abraxane x 9 doses from 06/03/2010 through 08/12/2010.  10.  Status post right lumpectomy with sentinel node biopsy 10/01/2010 which showed a stage 0, ypTis ypN0, high-grade 0.45 cm DCIS, ER 0%, PR 0%, margins were negative for carcinoma, 0/2 positive lymph nodes.  12.  Status post radiation therapy from 11/18/2010 through 01/12/2011.  13.  The patient had a PET scan on 03/14/2012 which showed no specific features identified to suggest residual or recurrence of breast cancer, right anterior rib fractures, left maxillary sinus inflammation.  14.  History of hypokalemia  15.  Anxiety  16.  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 GIB/ IBS/ chronic diarrhea  19. Neuropathy  20. Bilateral DVTs   PLAN: Arvie is now 4 years out from her definitive surgery , which showed no residual invasive disease after her neoadjuvant chemotherapy. DAT predicts for a good long-term prognosis.  [facility]  She does have multiple other medical problems which are being addressed through Dr. Forde Dandy.   She will be 5 years out when she sees [place] in August 2017 and she will "graduate" at that point. She knows to call for any other issues that may develop before that visit. Chauncey Cruel, MD  11/05/2014, 10:41 AM

## 2014-11-06 ENCOUNTER — Encounter (HOSPITAL_BASED_OUTPATIENT_CLINIC_OR_DEPARTMENT_OTHER): Payer: Medicare Other | Attending: Surgery

## 2014-11-06 DIAGNOSIS — D649 Anemia, unspecified: Secondary | ICD-10-CM | POA: Diagnosis not present

## 2014-11-06 DIAGNOSIS — G629 Polyneuropathy, unspecified: Secondary | ICD-10-CM | POA: Diagnosis not present

## 2014-11-06 DIAGNOSIS — I739 Peripheral vascular disease, unspecified: Secondary | ICD-10-CM | POA: Insufficient documentation

## 2014-11-06 DIAGNOSIS — Z86718 Personal history of other venous thrombosis and embolism: Secondary | ICD-10-CM | POA: Diagnosis not present

## 2014-11-06 DIAGNOSIS — G40909 Epilepsy, unspecified, not intractable, without status epilepticus: Secondary | ICD-10-CM | POA: Insufficient documentation

## 2014-11-06 DIAGNOSIS — I10 Essential (primary) hypertension: Secondary | ICD-10-CM | POA: Insufficient documentation

## 2014-11-06 DIAGNOSIS — M1990 Unspecified osteoarthritis, unspecified site: Secondary | ICD-10-CM | POA: Diagnosis not present

## 2014-11-06 DIAGNOSIS — S81812A Laceration without foreign body, left lower leg, initial encounter: Secondary | ICD-10-CM | POA: Insufficient documentation

## 2014-11-06 DIAGNOSIS — Z7952 Long term (current) use of systemic steroids: Secondary | ICD-10-CM | POA: Diagnosis not present

## 2014-11-06 DIAGNOSIS — G4730 Sleep apnea, unspecified: Secondary | ICD-10-CM | POA: Diagnosis not present

## 2014-11-06 DIAGNOSIS — J449 Chronic obstructive pulmonary disease, unspecified: Secondary | ICD-10-CM | POA: Insufficient documentation

## 2014-11-06 DIAGNOSIS — X58XXXA Exposure to other specified factors, initial encounter: Secondary | ICD-10-CM | POA: Insufficient documentation

## 2014-11-06 DIAGNOSIS — E2740 Unspecified adrenocortical insufficiency: Secondary | ICD-10-CM | POA: Diagnosis not present

## 2014-11-06 DIAGNOSIS — Z853 Personal history of malignant neoplasm of breast: Secondary | ICD-10-CM | POA: Insufficient documentation

## 2014-11-06 DIAGNOSIS — F419 Anxiety disorder, unspecified: Secondary | ICD-10-CM | POA: Insufficient documentation

## 2014-11-13 DIAGNOSIS — S81812A Laceration without foreign body, left lower leg, initial encounter: Secondary | ICD-10-CM | POA: Diagnosis not present

## 2014-11-20 DIAGNOSIS — S81812A Laceration without foreign body, left lower leg, initial encounter: Secondary | ICD-10-CM | POA: Diagnosis not present

## 2014-11-27 DIAGNOSIS — S81812A Laceration without foreign body, left lower leg, initial encounter: Secondary | ICD-10-CM | POA: Diagnosis not present

## 2014-11-28 ENCOUNTER — Other Ambulatory Visit: Payer: Self-pay | Admitting: Oncology

## 2014-11-28 ENCOUNTER — Other Ambulatory Visit: Payer: Self-pay

## 2014-11-28 DIAGNOSIS — Z853 Personal history of malignant neoplasm of breast: Secondary | ICD-10-CM

## 2014-12-04 ENCOUNTER — Encounter (HOSPITAL_BASED_OUTPATIENT_CLINIC_OR_DEPARTMENT_OTHER): Payer: Medicare Other | Attending: Surgery

## 2014-12-04 DIAGNOSIS — M797 Fibromyalgia: Secondary | ICD-10-CM | POA: Insufficient documentation

## 2014-12-04 DIAGNOSIS — G629 Polyneuropathy, unspecified: Secondary | ICD-10-CM | POA: Diagnosis not present

## 2014-12-04 DIAGNOSIS — J449 Chronic obstructive pulmonary disease, unspecified: Secondary | ICD-10-CM | POA: Diagnosis not present

## 2014-12-04 DIAGNOSIS — Y9389 Activity, other specified: Secondary | ICD-10-CM | POA: Diagnosis not present

## 2014-12-04 DIAGNOSIS — Z923 Personal history of irradiation: Secondary | ICD-10-CM | POA: Insufficient documentation

## 2014-12-04 DIAGNOSIS — T451X5A Adverse effect of antineoplastic and immunosuppressive drugs, initial encounter: Secondary | ICD-10-CM | POA: Diagnosis not present

## 2014-12-04 DIAGNOSIS — Z853 Personal history of malignant neoplasm of breast: Secondary | ICD-10-CM | POA: Insufficient documentation

## 2014-12-04 DIAGNOSIS — Z86711 Personal history of pulmonary embolism: Secondary | ICD-10-CM | POA: Diagnosis not present

## 2014-12-04 DIAGNOSIS — S80812D Abrasion, left lower leg, subsequent encounter: Secondary | ICD-10-CM | POA: Diagnosis not present

## 2014-12-04 DIAGNOSIS — Z86718 Personal history of other venous thrombosis and embolism: Secondary | ICD-10-CM | POA: Diagnosis not present

## 2014-12-04 DIAGNOSIS — I739 Peripheral vascular disease, unspecified: Secondary | ICD-10-CM | POA: Insufficient documentation

## 2014-12-04 DIAGNOSIS — G40909 Epilepsy, unspecified, not intractable, without status epilepticus: Secondary | ICD-10-CM | POA: Insufficient documentation

## 2014-12-04 DIAGNOSIS — E785 Hyperlipidemia, unspecified: Secondary | ICD-10-CM | POA: Insufficient documentation

## 2014-12-04 DIAGNOSIS — Z7952 Long term (current) use of systemic steroids: Secondary | ICD-10-CM | POA: Insufficient documentation

## 2014-12-04 DIAGNOSIS — E2740 Unspecified adrenocortical insufficiency: Secondary | ICD-10-CM | POA: Insufficient documentation

## 2014-12-04 DIAGNOSIS — J45909 Unspecified asthma, uncomplicated: Secondary | ICD-10-CM | POA: Diagnosis not present

## 2014-12-04 DIAGNOSIS — M1990 Unspecified osteoarthritis, unspecified site: Secondary | ICD-10-CM | POA: Diagnosis not present

## 2014-12-04 DIAGNOSIS — G4730 Sleep apnea, unspecified: Secondary | ICD-10-CM | POA: Diagnosis not present

## 2014-12-04 DIAGNOSIS — S81812D Laceration without foreign body, left lower leg, subsequent encounter: Secondary | ICD-10-CM | POA: Insufficient documentation

## 2014-12-04 DIAGNOSIS — X58XXXA Exposure to other specified factors, initial encounter: Secondary | ICD-10-CM | POA: Diagnosis not present

## 2014-12-04 DIAGNOSIS — I10 Essential (primary) hypertension: Secondary | ICD-10-CM | POA: Diagnosis not present

## 2014-12-05 ENCOUNTER — Ambulatory Visit
Admission: RE | Admit: 2014-12-05 | Discharge: 2014-12-05 | Disposition: A | Discharge status: Home / Self Care | Payer: Medicare Other | Source: Ambulatory Visit | Attending: Oncology | Admitting: Oncology

## 2014-12-05 DIAGNOSIS — Z853 Personal history of malignant neoplasm of breast: Secondary | ICD-10-CM

## 2014-12-11 DIAGNOSIS — S81812D Laceration without foreign body, left lower leg, subsequent encounter: Secondary | ICD-10-CM | POA: Diagnosis not present

## 2015-03-18 DIAGNOSIS — R05 Cough: Secondary | ICD-10-CM | POA: Diagnosis not present

## 2015-03-18 DIAGNOSIS — Z6824 Body mass index (BMI) 24.0-24.9, adult: Secondary | ICD-10-CM | POA: Diagnosis not present

## 2015-03-18 DIAGNOSIS — M810 Age-related osteoporosis without current pathological fracture: Secondary | ICD-10-CM | POA: Diagnosis not present

## 2015-03-18 DIAGNOSIS — Z79899 Other long term (current) drug therapy: Secondary | ICD-10-CM | POA: Diagnosis not present

## 2015-03-26 ENCOUNTER — Encounter: Payer: Self-pay | Admitting: Family

## 2015-03-26 ENCOUNTER — Other Ambulatory Visit: Payer: Self-pay | Admitting: Endocrinology

## 2015-03-26 ENCOUNTER — Telehealth: Payer: Self-pay

## 2015-03-26 ENCOUNTER — Ambulatory Visit (INDEPENDENT_AMBULATORY_CARE_PROVIDER_SITE_OTHER): Payer: PPO | Admitting: Family

## 2015-03-26 ENCOUNTER — Ambulatory Visit (HOSPITAL_COMMUNITY)
Admission: RE | Admit: 2015-03-26 | Discharge: 2015-03-26 | Disposition: A | Discharge status: Home / Self Care | Payer: PPO | Source: Ambulatory Visit | Attending: Family | Admitting: Family

## 2015-03-26 VITALS — BP 153/81 | HR 80 | Temp 97.2°F | Resp 16 | Ht 66.5 in | Wt 152.0 lb

## 2015-03-26 DIAGNOSIS — I82512 Chronic embolism and thrombosis of left femoral vein: Secondary | ICD-10-CM | POA: Diagnosis not present

## 2015-03-26 DIAGNOSIS — M79605 Pain in left leg: Secondary | ICD-10-CM

## 2015-03-26 DIAGNOSIS — E785 Hyperlipidemia, unspecified: Secondary | ICD-10-CM | POA: Insufficient documentation

## 2015-03-26 DIAGNOSIS — R0600 Dyspnea, unspecified: Secondary | ICD-10-CM

## 2015-03-26 DIAGNOSIS — I872 Venous insufficiency (chronic) (peripheral): Secondary | ICD-10-CM | POA: Diagnosis not present

## 2015-03-26 DIAGNOSIS — M7989 Other specified soft tissue disorders: Secondary | ICD-10-CM

## 2015-03-26 NOTE — Progress Notes (Signed)
Filed Vitals:   03/26/15 1504 03/26/15 1514  BP: 149/89 153/81  Pulse: 80 80  Temp: 97.2 F (36.2 C)   Resp: 16   Height: 5' 6.5" (1.689 m)   Weight: 152 lb (68.947 kg)   SpO2: 95%

## 2015-03-26 NOTE — Telephone Encounter (Signed)
Pt. called to report bilateral swelling in feet and lower legs.  Stated for the past 3 mos. Reported the left foot and lower leg, up to the calf, are swollen much worse than the right.  Stated she has warmth and tenderness in the left calf.  Reported there is a little improvement in swelling overnight, but throughout the day, her left lower leg increases to approx. 3 times the size.  Also c/o pain in both feet with walking. Stated she has Neuropathy.  Discussed with Dr. Scot Dock.  Recommended to schedule a venous duplex of left LE to r/o DVT, and an office appt. with NP.  Notified pt. of Dr. Nicole Cella recommendations and of appt. @ 2:00 PM today.  Agreed.

## 2015-03-26 NOTE — Progress Notes (Signed)
History of Present Illness  [name] is a 81 y.o. ([birth year]) female patient of Dr. Scot Dock who has known chronic venous insufficiency. She returns today with c/o left foot swelling for a month with increasing warmth.  She has a hx of DVT.  She last saw Dr. Scot Dock on 02/15/14. At that time the patient had a wound on her anterior lateral aspect of her left leg which appeared to be improving with Unna boots. She had evidence of significant deep vein reflux on the left and Dr. Scot Dock had discussed the importance of compression therapy and intermittent leg elevation and the proper positioning for this. Based on her exam, she had biphasic Doppler signals in both feet; therefore she likely had adequate arterial flow to heal these wounds. If she developed further wounds then we could obtain a formal arterial Doppler study but at that point given that the wound was healing and she had biphasic signals in her feet Dr. Scot Dock did not think that was necessary at that point. If she develops recurrent wounds or the current wound does not heal then we would proceed with a more aggressive arterial workup. In the meantime the pt was to continue with Unna boots and intermittent leg elevation.   Past Medical History  Diagnosis Date  . IBS (irritable bowel syndrome)   . Fibromyalgia   . History of DVT (deep vein thrombosis)   . History of pulmonary embolism   . Osteoporosis   . History of breast cancer LEFT BREAST DCIS  1996  . Hyperlipidemia   . 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 Breast cancer RIGHT SIDE-- DX OCT 2011    CHEMORADIATION THERAPY-- COMPLETED   . PONV (postoperative nausea and vomiting)   . Skin tear LEFT ARM COVERED W/ TEGADERM    PT STATES HX MULTIPLE SKIN TEARS -- THIN  . Thin skin SECONARDY AGE AND HX CHEMO  . Ecchymosis   . Seasonal allergies   . Frequency of urination   . Nocturia   . Anemia   . Lung mass PER DR CLANCE  NOTE UNCLEAR IF LYMPHOMA FROM BX    FOLLOWED BY DR RUBIN  . Seizures   . Unspecified hereditary and idiopathic peripheral neuropathy   . Asthma   . Fibromyalgia   . Vertebral compression fracture 5/14    L4  . CHF (congestive heart failure)     Social History Social History  Substance Use Topics  . Smoking status: Former Smoker -- 1.00 packs/day for 30 years    Types: Cigarettes    Quit date: 03/01/1978  . Smokeless tobacco: Never Used  . Alcohol Use: 4.2 - 6.0 oz/week    7-10 Glasses of wine per week    Family History Family History  Problem Relation Age of Onset  . Emphysema Father   . CAD Father   . Ovarian cancer Mother   . Cancer Mother     cervical  . Alzheimer's disease Mother   . Hyperlipidemia Mother   . Breast cancer Maternal Grandmother   . Cancer Sister     cervical    Surgical History Past Surgical History  Procedure Laterality Date  . Total abdominal hysterectomy w/ bilateral salpingoophorectomy  1977  (APPROX)  . Cholecystectomy    . Appendectomy    . Femur fracture surgery  12-18-2010  DR BEANE    INTRAMEDULLARY NAILING LEFT INTERTROCHANTERIC -SUBTROCHANTERIC FX  . Right breast lumpectomy / removal  lymph nodes x2/ removal pac  10-01-2010    CARCINOMA RIGHT BREAST  . Placement port- a- cath  02-18-2010  . Bronchoscopy  01-29-2010    W/ BX  . Tvt vaginal tape  suburethral sling   06-08-2005    SUI  . Bilateral laminectomy/ foraminotomy  01-14-2004    L4 - L5  . Breast lumpectomy  1996     LEFT BREAST DCIS   . Cataract extraction w/ intraocular lens  implant, bilateral    . Bilateral carpal tunnel release    . Tonsillectomy    . Transthoracic echocardiogram  02-25-2010    LVSF NORMAL/ EF [id number] GRADE I DIASTOLIC DYSFUNCTION/ MILDLY DILATED LEFT ATRIUM  . Hardware removal  10/11/2011    Procedure: HARDWARE REMOVAL;  Surgeon: Johnn Hai, MD;  Location: [facility];  Service: Orthopedics;  Laterality: Left;  LEFT THIGH  REMOVAL OF DISTAL LOCKING SCREW NEEDS: FLORO, RADIO LUSCENT TABLE AND SCREWDRIVER FOR BIOMET ROD   . Colonoscopy    . Esophagogastroduodenoscopy N/A 03/11/2013    Procedure: ESOPHAGOGASTRODUODENOSCOPY (EGD);  Surgeon: Lear Ng, MD;  Location: [facility];  Service: Endoscopy;  Laterality: N/A;  . Esophagogastroduodenoscopy (egd) with propofol Left 04/16/2014    Procedure: ESOPHAGOGASTRODUODENOSCOPY (EGD) WITH PROPOFOL;  Surgeon: Winfield Cunas., MD;  Location: [facility] ENDOSCOPY;  Service: Endoscopy;  Laterality: Left;  . Colonoscopy with propofol Left 04/16/2014    Procedure: COLONOSCOPY WITH PROPOFOL;  Surgeon: Winfield Cunas., MD;  Location: [facility] ENDOSCOPY;  Service: Endoscopy;  Laterality: Left;    Allergies  Allergen Reactions  . Penicillins Anaphylaxis  . Adhesive [Tape] Other (See Comments)    Blisters   . Doxycycline Nausea And Vomiting  . Morphine And Related Other (See Comments)    HALLUCINATIONS    Current Outpatient Prescriptions  Medication Sig Dispense Refill  . aspirin EC 81 MG tablet Take 81 mg by mouth daily.    Marland Kitchen dexlansoprazole (DEXILANT) 60 MG capsule Take 60 mg by mouth daily.     . DULoxetine (CYMBALTA) 60 MG capsule Take 60 mg by mouth daily after lunch.     . ergocalciferol (VITAMIN D2) 50000 UNITS capsule Take 50,000 Units by mouth once a week. Every Tuesday.    . ezetimibe-simvastatin (VYTORIN) 10-80 MG per tablet Take 1 tablet by mouth at bedtime. For hyperlipidemia    . gabapentin (NEURONTIN) 100 MG capsule Take 100 mg by mouth 3 (three) times daily.     Marland Kitchen levothyroxine (SYNTHROID, LEVOTHROID) 88 MCG tablet Take 88 mcg by mouth daily before breakfast. For hypothyroid    . LORazepam (ATIVAN) 0.5 MG tablet Take 0.5 mg by mouth every 8 (eight) hours as needed for anxiety. Anxiety    . oxyCODONE-acetaminophen (PERCOCET/ROXICET) 5-325 MG per tablet Take 1 tablet by mouth every 6 (six) hours as needed for moderate pain. (Patient not taking: Reported on  05/11/2014) 30 tablet 0  . potassium chloride SA (K-DUR,KLOR-CON) 20 MEQ tablet Take 20 mEq by mouth daily.    . predniSONE (DELTASONE) 5 MG tablet Take 5 mg by mouth daily.     No current facility-administered medications for this visit.   Facility-Administered Medications Ordered in Other Visits  Medication Dose Route Frequency Provider Last Rate Last Dose  . fentaNYL (SUBLIMAZE) injection 25-50 mcg  25-50 mcg Intravenous Q5 min PRN Rod Mae, MD        REVIEW OF SYSTEMS: see HPI for pertinent positives and negatives   Physical Examination  Filed Vitals:  03/26/15 1504 03/26/15 1514  BP: 149/89 153/81  Pulse: 80 80  Temp: 97.2 F (36.2 C)   Resp: 16   Height: 5' 6.5" (1.689 m)   Weight: 152 lb (68.947 kg)   SpO2: 95%    Body mass index is 24.17 kg/(m^2).  General: The patient appears their stated age.   HEENT:  No gross abnormalities Pulmonary: Respirations are non-labored Abdomen: Soft and non-tender. Musculoskeletal: There are no major deformities.   Neurologic: No focal weakness or paresthesias are detected, Skin: There are no ulcer or rashes noted. Friable appearing and thin skin. Pitting edema in both pre-tibial areas: trace in right, 1+ left Psychiatric: The patient has normal affect. Cardiovascular: There is a regular rate and rhythm without significant murmur appreciated.    Vascular: Vessel Right Left  Radial 2+Palpable 2+Palpable  Aorta Not palpable N/A  Popliteal Not palpable Not palpable  PT notPalpable notPalpable  DP 1+Palpable 2+Palpable   Non-Invasive Vascular Imaging  LLE Venous Duplex (Date: 03/26/2015):  LOWER EXTREMITY VENOUS DUPLEX EVALUATION    INDICATION: Left lower extremity edema    PREVIOUS INTERVENTION(S): History of left lower extremity deep vein thrombosis    DUPLEX EXAM:      Common Femoral Vein  Femoral Vein Popliteal Vein Posterior Tibial Vein  Peroneal Vein Great Saphenous Vein   Right Left Right Left Right Left Right  Left Right Left Right Left  Spontaneous + +  -  -    -  +  Phasic + +  -  -    -  +  Compressible + +  P  +    +  +  Augmentation + +  +  +    +  +  Competent - -  -      -  NE     Legend:  + = Yes, -  = No; P = Partial, D = Decreased, NV = Not Visualized, NA = Not Examined    Thrombosis - -  C - -  -  -  -                                                        Legend:  A = Acute, C = Chronic, O = Obstructive, P = Partially Obstructive     ADDITIONAL FINDINGS:     IMPRESSION: 1. No evidence of acute left lower extremity deep vein thrombosis, small contracted vein with residual thrombus with slight flow in the mid to distal femoral vein 2. The peroneal vein not visualized in its entirety, no obvious thrombus.      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 female who presents with: bilateral leg chronic venous insufficiency, no open wounds, no venous stasis ulcers. She has palpable pedal pulses. No erythema. No DVT of the left leg on venous duplex today. Dr. Scot Dock reviewed today's lower extremity venous duplex result, spoke with and examined pt.  Based on the patient's vascular studies and examination, I have offered the patient: script for 15-20 mm Hg knee high graduated compression hose.  Ace wrap to left lower leg for graduated compression, to be rewrapped by pt daily; pt states her son can also help her do this.  Elevation of feet legs 15-20 minutes, 3-4x/day, as discussed with pt.  Return  as needed.     Quavion Boule, Sharmon Leyden, RN, MSN, FNP-C [facility] Office: [phone]  03/26/2015, 2:40 PM  Clinic MD: Scot Dock

## 2015-03-26 NOTE — Patient Instructions (Signed)

## 2015-04-01 ENCOUNTER — Ambulatory Visit
Admission: RE | Admit: 2015-04-01 | Discharge: 2015-04-01 | Disposition: A | Discharge status: Home / Self Care | Payer: PPO | Source: Ambulatory Visit | Attending: Endocrinology | Admitting: Endocrinology

## 2015-04-01 DIAGNOSIS — R0600 Dyspnea, unspecified: Secondary | ICD-10-CM

## 2015-04-01 DIAGNOSIS — R918 Other nonspecific abnormal finding of lung field: Secondary | ICD-10-CM | POA: Diagnosis not present

## 2015-04-01 MED ORDER — IOPAMIDOL (ISOVUE-300) INJECTION 61%
75.0000 mL | Freq: Once | INTRAVENOUS | Status: AC | PRN
  Administered 2015-04-01: 75 mL via INTRAVENOUS

## 2015-04-09 ENCOUNTER — Ambulatory Visit (HOSPITAL_COMMUNITY)
Admission: RE | Admit: 2015-04-09 | Discharge: 2015-04-09 | Disposition: A | Discharge status: Home / Self Care | Payer: PPO | Source: Ambulatory Visit | Attending: Endocrinology | Admitting: Endocrinology

## 2015-04-16 ENCOUNTER — Encounter (HOSPITAL_COMMUNITY)
Admission: RE | Admit: 2015-04-16 | Discharge: 2015-04-16 | Disposition: A | Discharge status: Home / Self Care | Payer: PPO | Source: Ambulatory Visit | Attending: Endocrinology | Admitting: Endocrinology

## 2015-04-16 ENCOUNTER — Encounter (HOSPITAL_COMMUNITY): Payer: Self-pay

## 2015-04-16 DIAGNOSIS — M810 Age-related osteoporosis without current pathological fracture: Secondary | ICD-10-CM | POA: Diagnosis not present

## 2015-04-16 MED ORDER — SODIUM CHLORIDE 0.9 % IV SOLN
Freq: Once | INTRAVENOUS | Status: AC
  Administered 2015-04-16: 15:00:00 via INTRAVENOUS

## 2015-04-16 MED ORDER — ZOLEDRONIC ACID 5 MG/100ML IV SOLN
5.0000 mg | Freq: Once | INTRAVENOUS | Status: AC
  Administered 2015-04-16: 5 mg via INTRAVENOUS
  Filled 2015-04-16: qty 100

## 2015-04-16 NOTE — Progress Notes (Signed)
Pt arrives today for her RECLAST infusion. She states she is not taking a calcium supplement but she does take Vitamin D and dietary calcium. Calcium lab work is WNL and pt will discuss with her PCP about whether to take additional calcium or not

## 2015-04-16 NOTE — Discharge Instructions (Signed)
RECLAST °Zoledronic Acid injection (Paget's Disease, Osteoporosis) °What is this medicine? °ZOLEDRONIC ACID (ZOE le dron ik AS id) lowers the amount of calcium loss from bone. It is used to treat Paget's disease and osteoporosis in women. °This medicine may be used for other purposes; ask your health care provider or pharmacist if you have questions. °What should I tell my health care provider before I take this medicine? °They need to know if you have any of these conditions: °-aspirin-sensitive asthma °-cancer, especially if you are receiving medicines used to treat cancer °-dental disease or wear dentures °-infection °-kidney disease °-low levels of calcium in the blood °-past surgery on the parathyroid gland or intestines °-receiving corticosteroids like dexamethasone or prednisone °-an unusual or allergic reaction to zoledronic acid, other medicines, foods, dyes, or preservatives °-pregnant or trying to get pregnant °-breast-feeding °How should I use this medicine? °This medicine is for infusion into a vein. It is given by a health care professional in a hospital or clinic setting. °Talk to your pediatrician regarding the use of this medicine in children. This medicine is not approved for use in children. °Overdosage: If you think you have taken too much of this medicine contact a poison control center or emergency room at once. °NOTE: This medicine is only for you. Do not share this medicine with others. °What if I miss a dose? °It is important not to miss your dose. Call your doctor or health care professional if you are unable to keep an appointment. °What may interact with this medicine? °-certain antibiotics given by injection °-NSAIDs, medicines for pain and inflammation, like ibuprofen or naproxen °-some diuretics like bumetanide, furosemide °-teriparatide °This list may not describe all possible interactions. Give your health care provider a list of all the medicines, herbs, non-prescription drugs, or  dietary supplements you use. Also tell them if you smoke, drink alcohol, or use illegal drugs. Some items may interact with your medicine. °What should I watch for while using this medicine? °Visit your doctor or health care professional for regular checkups. It may be some time before you see the benefit from this medicine. Do not stop taking your medicine unless your doctor tells you to. Your doctor may order blood tests or other tests to see how you are doing. °Women should inform their doctor if they wish to become pregnant or think they might be pregnant. There is a potential for serious side effects to an unborn child. Talk to your health care professional or pharmacist for more information. °You should make sure that you get enough calcium and vitamin D while you are taking this medicine. Discuss the foods you eat and the vitamins you take with your health care professional. °Some people who take this medicine have severe bone, joint, and/or muscle pain. This medicine may also increase your risk for jaw problems or a broken thigh bone. Tell your doctor right away if you have severe pain in your jaw, bones, joints, or muscles. Tell your doctor if you have any pain that does not go away or that gets worse. °Tell your dentist and dental surgeon that you are taking this medicine. You should not have major dental surgery while on this medicine. See your dentist to have a dental exam and fix any dental problems before starting this medicine. Take good care of your teeth while on this medicine. Make sure you see your dentist for regular follow-up appointments. °What side effects may I notice from receiving this medicine? °Side effects that you   should report to your doctor or health care professional as soon as possible: °-allergic reactions like skin rash, itching or hives, swelling of the face, lips, or tongue °-anxiety, confusion, or depression °-breathing problems °-changes in vision °-eye pain °-feeling faint or  lightheaded, falls °-jaw pain, especially after dental work °-mouth sores °-muscle cramps, stiffness, or weakness °-redness, blistering, peeling or loosening of the skin, including inside the mouth °-trouble passing urine or change in the amount of urine °Side effects that usually do not require medical attention (report to your doctor or health care professional if they continue or are bothersome): °-bone, joint, or muscle pain °-constipation °-diarrhea °-fever °-hair loss °-irritation at site where injected °-loss of appetite °-nausea, vomiting °-stomach upset °-trouble sleeping °-trouble swallowing °-weak or tired °This list may not describe all possible side effects. Call your doctor for medical advice about side effects. You may report side effects to FDA at 1-800-FDA-1088. °Where should I keep my medicine? °This drug is given in a hospital or clinic and will not be stored at home. °NOTE: This sheet is a summary. It may not cover all possible information. If you have questions about this medicine, talk to your doctor, pharmacist, or health care provider. °  °© 2016, Elsevier/Gold Standard. (2013-07-14 14:19:57) °Osteoporosis °Osteoporosis is the thinning and loss of density in the bones. Osteoporosis makes the bones more brittle, fragile, and likely to break (fracture). Over time, osteoporosis can cause the bones to become so weak that they fracture after a simple fall. The bones most likely to fracture are the bones in the hip, wrist, and spine. °CAUSES  °The exact cause is not known. °RISK FACTORS °Anyone can develop osteoporosis. You may be at greater risk if you have a family history of the condition or have poor nutrition. You may also have a higher risk if you are:  °· Female.   °· 50 years old or older. °· A smoker. °· Not physically active.   °· White or Asian. °· Slender. °SIGNS AND SYMPTOMS  °A fracture might be the first sign of the disease, especially if it results from a fall or injury that would  not usually cause a bone to break. Other signs and symptoms include:  °· Low back and neck pain. °· Stooped posture. °· Height loss. °DIAGNOSIS  °To make a diagnosis, your health care provider may: °· Take a medical history. °· Perform a physical exam. °· Order tests, such as: °¨ A bone mineral density test. °¨ A dual-energy X-ray absorptiometry test. °TREATMENT  °The goal of osteoporosis treatment is to strengthen your bones to reduce your risk of a fracture. Treatment may involve: °· Making lifestyle changes, such as: °¨ Eating a diet rich in calcium. °¨ Doing weight-bearing and muscle-strengthening exercises. °¨ Stopping tobacco use. °¨ Limiting alcohol intake. °· Taking medicine to slow the process of bone loss or to increase bone density. °· Monitoring your levels of calcium and vitamin D. °HOME CARE INSTRUCTIONS °· Include calcium and vitamin D in your diet. Calcium is important for bone health, and vitamin D helps the body absorb calcium. °· Perform weight-bearing and muscle-strengthening exercises as directed by your health care provider. °· Do not use any tobacco products, including cigarettes, chewing tobacco, and electronic cigarettes. If you need help quitting, ask your health care provider. °· Limit your alcohol intake. °· Take medicines only as directed by your health care provider. °· Keep all follow-up visits as directed by your health care provider. This is important. °· Take   precautions at home to lower your risk of falling, such as: °¨ Keeping rooms well lit and clutter free. °¨ Installing safety rails on stairs. °¨ Using rubber mats in the bathroom and other areas that are often wet or slippery. °SEEK IMMEDIATE MEDICAL CARE IF:  °You fall or injure yourself.  °  °This information is not intended to replace advice given to you by your health care provider. Make sure you discuss any questions you have with your health care provider. °  °Document Released: 11/25/2004 Document Revised: 03/08/2014  Document Reviewed: 07/26/2013 °Elsevier Interactive Patient Education ©2016 [employer]vier Inc. ° ° °

## 2015-04-16 NOTE — Progress Notes (Signed)
Pt here today for her RECLAST. Post infusion bp 160/70 . Pt denies any problems pt states my BP fluctuates on occasions rechecked it on lower arm medium cuff 161/71. Pt rested for 15 minutes then ambulated to BR. Pt has left hip that is very painful and she was limping as walking to BR. State she doesn't take anything for it and the gabapenton doesn't help. Pt ambulated back to recliner. She states" sometimes I just feel myself just tensing up" and "my hip hurts all the time I just live with it". i spoke with patient about [place]. i discussed Lavender for relaxation. Pt is eager to try this. 2 drops Lavendar  on 2x2 in medicine cup was given to patient for inhalation. Pt was very pleased and states she is not sure it is working but feels fine. BP now 150/71. Pt and niece are eager to leave and go to lunch. Offered patient a wheelchair for discharge but she declined. Discharged ambulatory with rolling walker accompanied by niece to elevator

## 2015-06-04 DIAGNOSIS — E46 Unspecified protein-calorie malnutrition: Secondary | ICD-10-CM | POA: Diagnosis not present

## 2015-06-04 DIAGNOSIS — R569 Unspecified convulsions: Secondary | ICD-10-CM | POA: Diagnosis not present

## 2015-06-04 DIAGNOSIS — D126 Benign neoplasm of colon, unspecified: Secondary | ICD-10-CM | POA: Diagnosis not present

## 2015-06-04 DIAGNOSIS — R269 Unspecified abnormalities of gait and mobility: Secondary | ICD-10-CM | POA: Diagnosis not present

## 2015-06-04 DIAGNOSIS — Z8672 Personal history of thrombophlebitis: Secondary | ICD-10-CM | POA: Diagnosis not present

## 2015-06-04 DIAGNOSIS — F418 Other specified anxiety disorders: Secondary | ICD-10-CM | POA: Diagnosis not present

## 2015-06-04 DIAGNOSIS — Z1389 Encounter for screening for other disorder: Secondary | ICD-10-CM | POA: Diagnosis not present

## 2015-06-04 DIAGNOSIS — D649 Anemia, unspecified: Secondary | ICD-10-CM | POA: Diagnosis not present

## 2015-06-04 DIAGNOSIS — J439 Emphysema, unspecified: Secondary | ICD-10-CM | POA: Diagnosis not present

## 2015-06-04 DIAGNOSIS — E785 Hyperlipidemia, unspecified: Secondary | ICD-10-CM | POA: Diagnosis not present

## 2015-06-04 DIAGNOSIS — E2749 Other adrenocortical insufficiency: Secondary | ICD-10-CM | POA: Diagnosis not present

## 2015-06-04 DIAGNOSIS — I10 Essential (primary) hypertension: Secondary | ICD-10-CM | POA: Diagnosis not present

## 2015-06-30 DIAGNOSIS — K5289 Other specified noninfective gastroenteritis and colitis: Secondary | ICD-10-CM | POA: Diagnosis not present

## 2015-07-09 DIAGNOSIS — R05 Cough: Secondary | ICD-10-CM | POA: Diagnosis not present

## 2015-07-09 DIAGNOSIS — Z6822 Body mass index (BMI) 22.0-22.9, adult: Secondary | ICD-10-CM | POA: Diagnosis not present

## 2015-07-09 DIAGNOSIS — K529 Noninfective gastroenteritis and colitis, unspecified: Secondary | ICD-10-CM | POA: Diagnosis not present

## 2015-07-09 DIAGNOSIS — J181 Lobar pneumonia, unspecified organism: Secondary | ICD-10-CM | POA: Diagnosis not present

## 2015-07-09 DIAGNOSIS — D6489 Other specified anemias: Secondary | ICD-10-CM | POA: Diagnosis not present

## 2015-07-09 DIAGNOSIS — J439 Emphysema, unspecified: Secondary | ICD-10-CM | POA: Diagnosis not present

## 2015-07-11 DIAGNOSIS — J181 Lobar pneumonia, unspecified organism: Secondary | ICD-10-CM | POA: Diagnosis not present

## 2015-07-11 DIAGNOSIS — Z6823 Body mass index (BMI) 23.0-23.9, adult: Secondary | ICD-10-CM | POA: Diagnosis not present

## 2015-08-04 DIAGNOSIS — Z961 Presence of intraocular lens: Secondary | ICD-10-CM | POA: Diagnosis not present

## 2015-08-04 DIAGNOSIS — Z0100 Encounter for examination of eyes and vision without abnormal findings: Secondary | ICD-10-CM | POA: Diagnosis not present

## 2015-08-14 DIAGNOSIS — J181 Lobar pneumonia, unspecified organism: Secondary | ICD-10-CM | POA: Diagnosis not present

## 2015-08-18 DIAGNOSIS — K5289 Other specified noninfective gastroenteritis and colitis: Secondary | ICD-10-CM | POA: Diagnosis not present

## 2015-09-17 DIAGNOSIS — J181 Lobar pneumonia, unspecified organism: Secondary | ICD-10-CM | POA: Diagnosis not present

## 2015-10-07 ENCOUNTER — Ambulatory Visit (HOSPITAL_BASED_OUTPATIENT_CLINIC_OR_DEPARTMENT_OTHER): Payer: PPO | Admitting: Oncology

## 2015-10-07 VITALS — BP 135/64 | HR 77 | Resp 18 | Ht 66.5 in | Wt 140.3 lb

## 2015-10-07 DIAGNOSIS — Z853 Personal history of malignant neoplasm of breast: Secondary | ICD-10-CM

## 2015-10-07 DIAGNOSIS — M810 Age-related osteoporosis without current pathological fracture: Secondary | ICD-10-CM

## 2015-10-07 DIAGNOSIS — F411 Generalized anxiety disorder: Secondary | ICD-10-CM | POA: Diagnosis not present

## 2015-10-07 DIAGNOSIS — Z86718 Personal history of other venous thrombosis and embolism: Secondary | ICD-10-CM

## 2015-10-07 DIAGNOSIS — G629 Polyneuropathy, unspecified: Secondary | ICD-10-CM | POA: Diagnosis not present

## 2015-10-07 DIAGNOSIS — C50411 Malignant neoplasm of upper-outer quadrant of right female breast: Secondary | ICD-10-CM

## 2015-10-07 NOTE — Progress Notes (Signed)
[place]  [phone] [phone]) [phone]  OFFICE PROGRESS NOTE   ID: Jagger Beahm Kurek   DOB: 15-Feb-1935  MR#: [id number]  OIT#:[id number]   PCP: Sheela Stack, MD SU: Neldon Mc, M.D. RAD ONC: Arloa Koh, M.D. NEURO:  Marcial Pacas, M.D., Lizbeth Bark M.D., Doreene Nest MD   HISTORY OF PRESENT ILLNESS: From Dr. Collier Salina Rubin's new patient evaluation note dated 01/05/2010:  "This woman has been in excellent health.  She has undergone annual screening mammography.  She underwent a routine screening mammogram on 12/11/2009, this showed a possible mass, right breast.  The patient was contacted and underwent a diagnostic mammogram and right breast ultrasound on 12/19/2009 at the time of the imaging tests, physical exam did not reveal any abnormalities.  There is focal tenderness at the 10 o' clock, 2 cm from the right nipple.  Ultrasound showed a hypoechoic mass with a microlobulated border at 10 o' clock, 2 cm from the nipple measuring 2 x 1.2 x 1.8 cm.  Ultrasound of that axilla did not reveal abnormal lymph nodes.  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  (The case [id number]).  The estrogen receptors were negative, progesterone receptor was negative, proliferative index 96% and the HER-2 had a ratio of 1.31.  An MRI scan was done 12/29/2009, which showed enhancing 1.8 x 2.3 x 2.4 cm mass in the left and upper quadrant.  No enlarged lymph nodes were seen.  Some sub-centimeter lesions were seen in the liver, likely representing cyst.  The patient has elected to undergo genetic testing and is awaiting those results prior to making a final decision regarding surgical therapy.  In addition, she is here to discuss neoadjuvant therapy.  This patient has a history of DCIS involving the left breast.  This is associated with microinvasion.  This was diagnosed in 1996.  She had left  central lumpectomy and axillary node dissection that was negative.  She was seen by Dr. Noreene Filbert, and underwent radiation therapy to the breast.  She has not received any adjuvant hormonal therapy.  We do not have any other details regarding the specific pathological diagnosis."    Her subsequent history is as detailed below.   INTERVAL HISTORY: Pleas Koch returns today for followup of her bilateral breast cancers. The interval history is generally unremarkable. She continues to have significant other medical problems and she is still actively grieving the loss of her husband 79 years ago . REVIEW OF SYSTEMS: Her worst complaint is fatigue. She really think she can do things but when she starts getting it done she finds she can only get there halfway. She has problems with irritable bowel which Dr. May got this helping her with. Her COPD acts up "all the time". Dr. Forde Dandy helps her with that. She has constant muscle aches, which are however not more intense than before. Her vision is poor and she is thinking of getting prism glasses. She keeps a dry cough. She has stress urinary incontinence. A detailed review of systems today was otherwise stable.  PAST MEDICAL HISTORY: Past Medical History:  Diagnosis Date  . Anemia   . Asthma   . Breast cancer ([place]) RIGHT SIDE-- DX OCT 2011   CHEMORADIATION THERAPY-- COMPLETED   . CHF (congestive heart failure) ([place])   . Ecchymosis   . Fibromyalgia   . Fibromyalgia   . Frequency of urination   . History of breast cancer LEFT BREAST  DCIS  1996  . History of DVT (deep vein thrombosis)   . History of pulmonary embolism   . Hyperlipidemia   . IBS (irritable bowel syndrome)   . Lung mass PER DR CLANCE NOTE UNCLEAR IF LYMPHOMA FROM BX   FOLLOWED BY DR RUBIN  . 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 Nocturia   . Osteoporosis   . PONV (postoperative nausea and vomiting)   . Seasonal allergies   . Seizures  ([place])   . Skin tear LEFT ARM COVERED W/ TEGADERM   PT STATES HX MULTIPLE SKIN TEARS -- THIN  . Thin skin SECONARDY AGE AND HX CHEMO  . Unspecified hereditary and idiopathic peripheral neuropathy   . Vertebral compression fracture ([place]) 5/14   L4    PAST SURGICAL HISTORY: Past Surgical History:  Procedure Laterality Date  . APPENDECTOMY    . BILATERAL CARPAL TUNNEL RELEASE    . BILATERAL LAMINECTOMY/ FORAMINOTOMY  01-14-2004   L4 - L5  . BREAST LUMPECTOMY  1996    LEFT BREAST DCIS   . BRONCHOSCOPY  01-29-2010   W/ BX  . CATARACT EXTRACTION W/ INTRAOCULAR LENS  IMPLANT, BILATERAL    . CHOLECYSTECTOMY    . COLONOSCOPY    . COLONOSCOPY WITH PROPOFOL Left 04/16/2014   Procedure: COLONOSCOPY WITH PROPOFOL;  Surgeon: Winfield Cunas., MD;  Location[facility] ENDOSCOPY;  Service: Endoscopy;  Laterality: Left;  . ESOPHAGOGASTRODUODENOSCOPY N/A 03/11/2013   Procedure: ESOPHAGOGASTRODUODENOSCOPY (EGD);  Surgeon: Lear Ng, MD;  Location: [facility] ENDOSCOPY;  Service: Endoscopy;  Laterality: N/A;  . ESOPHAGOGASTRODUODENOSCOPY (EGD) WITH PROPOFOL Left 04/16/2014   Procedure: ESOPHAGOGASTRODUODENOSCOPY (EGD) WITH PROPOFOL;  Surgeon: Winfield Cunas., MD;  Location[facility] ENDOSCOPY;  Service: Endoscopy;  Laterality: Left;  . FEMUR FRACTURE SURGERY  12-18-2010  DR Tonita Cong   INTRAMEDULLARY NAILING LEFT INTERTROCHANTERIC -SUBTROCHANTERIC FX  . HARDWARE REMOVAL  10/11/2011   Procedure: HARDWARE REMOVAL;  Surgeon: Johnn Hai, MD;  Location: [facility];  Service: Orthopedics;  Laterality: Left;  LEFT THIGH REMOVAL OF DISTAL LOCKING SCREW NEEDS: FLORO, RADIO LUSCENT TABLE AND SCREWDRIVER FOR BIOMET ROD   . PLACEMENT PORT- A- CATH  02-18-2010  . RIGHT BREAST LUMPECTOMY / REMOVAL LYMPH NODES X2/ REMOVAL PAC  10-01-2010   CARCINOMA RIGHT BREAST  . TONSILLECTOMY    . TOTAL ABDOMINAL HYSTERECTOMY W/ BILATERAL SALPINGOOPHORECTOMY  1977  (APPROX)  . TRANSTHORACIC ECHOCARDIOGRAM  02-25-2010    LVSF NORMAL/ EF 05-69%/ GRADE I DIASTOLIC DYSFUNCTION/ MILDLY DILATED LEFT ATRIUM  . TVT VAGINAL TAPE  SUBURETHRAL SLING   06-08-2005   SUI  Includes history of hysterectomy 30 years ago with oophorectomy.  This is done by Dr. Maryruth Eve by her report.  She had a large ovarian mass, which was diagnosed as possibly a borderline tumor.  She had a cholecystectomy 20 years ago, appendectomy 50 years ago.  FAMILY HISTORY Family History  Problem Relation Age of Onset  . Emphysema Father   . CAD Father   . Ovarian cancer Mother   . Cancer Mother     cervical  . Alzheimer's disease Mother   . Hyperlipidemia Mother   . Breast cancer Maternal Grandmother   . Cancer Sister     cervical  Both parents deceased.  Father died about age 42 of unknown causes.  Mother died over 5 years ago, the patient reports she had ovarian cancer as did the patient's sister with both ovarian and breast cancer.  The patient has a maternal aunt who had ovarian cancer.  Five maternal aunts, one had ovarian cancer and one had breast cancer, a niece had breast cancer.  GYNECOLOGIC HISTORY: Gravida 2, para 2, menarche age 77, menopause at the time of hysterectomy.  She does not recall her last bone density test.  SOCIAL HISTORY: The patient had been married for over 55 years. Her husband friend died in 71. They have two adult sons, 5 grandsons, and 2 great grandchildren.  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  One of her sons currently lives with her. She has family that lives in [place], [place].  Her daughter is a Marine scientist who works for [employer].  In her spare time the patient enjoys gardening, swimming, being with her family, and going to church.   ADVANCED DIRECTIVES: Not on file  HEALTH MAINTENANCE: Social History  Substance Use Topics  . Smoking status: Former Smoker    Packs/day: 1.00    Years: 30.00    Types: Cigarettes    Quit date:  03/01/1978  . Smokeless tobacco: Never Used  . Alcohol use 4.2 - 6.0 oz/week    7 - 10 Glasses of wine per week    Colonoscopy:  PAP:  Bone density:  (osteoporosis). Lipid panel: Not on file  Allergies  Allergen Reactions  . Penicillins Anaphylaxis  . Adhesive [Tape] Other (See Comments)    Blisters   . Doxycycline Nausea And Vomiting  . Morphine And Related Other (See Comments)    HALLUCINATIONS    Current Outpatient Prescriptions  Medication Sig Dispense Refill  . aspirin EC 81 MG tablet Take 81 mg by mouth daily.    Marland Kitchen dexlansoprazole (DEXILANT) 60 MG capsule Take 60 mg by mouth daily.     . DULoxetine (CYMBALTA) 60 MG capsule Take 60 mg by mouth daily after lunch.     . ergocalciferol (VITAMIN D2) 50000 UNITS capsule Take 50,000 Units by mouth once a week. Every Tuesday.    . ezetimibe-simvastatin (VYTORIN) 10-80 MG per tablet Take 1 tablet by mouth at bedtime. Reported on 03/26/2015    . gabapentin (NEURONTIN) 100 MG capsule Take 100 mg by mouth 3 (three) times daily.     Marland Kitchen levothyroxine (SYNTHROID, LEVOTHROID) 88 MCG tablet Take 88 mcg by mouth daily before breakfast. For hypothyroid    . LORazepam (ATIVAN) 0.5 MG tablet Take 0.5 mg by mouth every 8 (eight) hours as needed for anxiety. Anxiety    . predniSONE (DELTASONE) 5 MG tablet Take 5 mg by mouth daily. Reported on 03/26/2015     No current facility-administered medications for this visit.    Facility-Administered Medications Ordered in Other Visits  Medication Dose Route Frequency Provider Last Rate Last Dose  . fentaNYL (SUBLIMAZE) injection 25-50 mcg  25-50 mcg Intravenous Q5 min PRN Rod Mae, MD         Objective: older white woman  who appears stated age 60:   10/07/15 1137  BP: 135/64  Pulse: 77  Resp: 18     Body mass index is 22.31 kg/m.      ECOG FS:  2           Sclerae unicteric, pupils round and equal Oropharynx clear and moist-- no thrush or other lesions No cervical or supraclavicular  adenopathy Lungs no rales or rhonchi Heart regular rate and rhythm Abd soft, nontender, positive bowel sounds MSK no focal spinal tenderness, no upper extremity lymphedema Neuro: nonfocal,  well oriented, appropriate affect Breasts: Status post bilateral lumpectomies. There is no evidence of local recurrence. Both axillae are benign.    LAB RESULTS: Lab Results  Component Value Date   WBC 9.0 11/05/2014   NEUTROABS 5.6 11/05/2014   HGB 14.8 11/05/2014   HCT 45.8 11/05/2014   MCV 95.8 11/05/2014   PLT 249 11/05/2014      Chemistry      Component Value Date/Time   NA 139 04/17/2014 0445   NA 143 08/01/2013 1057   K 3.7 04/17/2014 0445   K 3.3 (L) 08/01/2013 1057   CL 105 04/17/2014 0445   CL 102 07/04/2012 1456   CO2 29 04/17/2014 0445   CO2 19 (L) 08/01/2013 1057   BUN 20 04/17/2014 0445   BUN 14.7 08/01/2013 1057   CREATININE 0.84 04/17/2014 0445   CREATININE 0.8 08/01/2013 1057      Component Value Date/Time   CALCIUM 8.3 (L) 04/17/2014 0445   CALCIUM 9.1 08/01/2013 1057   ALKPHOS 86 04/17/2014 0445   ALKPHOS 111 08/01/2013 1057   AST 22 04/17/2014 0445   AST 12 08/01/2013 1057   ALT 25 04/17/2014 0445   ALT 9 08/01/2013 1057   BILITOT 0.2 (L) 04/17/2014 0445   BILITOT 0.34 08/01/2013 1057       Lab Results  Component Value Date   LABCA2 35 01/05/2010    Urinalysis    Component Value Date/Time   COLORURINE YELLOW 04/10/2014 2045   APPEARANCEUR CLOUDY (A) 04/10/2014 2045   LABSPEC 1.021 04/10/2014 2045   PHURINE 6.0 04/10/2014 2045   GLUCOSEU NEGATIVE 04/10/2014 2045   HGBUR TRACE (A) 04/10/2014 2045   BILIRUBINUR NEGATIVE 04/10/2014 2045   KETONESUR NEGATIVE 04/10/2014 2045   PROTEINUR NEGATIVE 04/10/2014 2045   UROBILINOGEN 0.2 04/10/2014 2045   NITRITE POSITIVE (A) 04/10/2014 2045   LEUKOCYTESUR MODERATE (A) 04/10/2014 2045    STUDIES: CLINICAL DATA:  Bilateral breast cancer status post left lumpectomy 20 years ago and right lumpectomy  with chemotherapy and radiation therapy 1 year ago. Creatinine was obtained on site at [facility] at [address]Results: Creatinine 0.7 mg/dL.  EXAM: CT CHEST WITH CONTRAST  TECHNIQUE: Multidetector CT imaging of the chest was performed during intravenous contrast administration.  CONTRAST:  39m ISOVUE-300 IOPAMIDOL (ISOVUE-300) INJECTION 61%  COMPARISON:  Chest CT 04/09/2014. Bone scan 05/24/2014. Abdominal CT 08/07/2012.  FINDINGS: Mediastinum/Nodes: No enlarged mediastinal, hilar, internal mammary or axillary lymph nodes are identified. Right breast dermal thickening and an underlying oil cyst are similar to the prior examination. There are postsurgical changes within the left breast and left axilla. There is a moderate size hiatal hernia. The heart size is normal. There is no pericardial effusion. There is diffuse atherosclerosis of the aorta, great vessels and coronary arteries.  Lungs/Pleura: There is no pleural effusion. There is grossly stable chronic lung disease with emphysema, bronchiectasis and scattered subpleural reticulation. Generalized interstitial prominence has improved compared with the prior study. There is no confluent airspace opacity or suspicious pulmonary nodule.  Upper abdomen: No suspicious findings are seen within the upper abdomen. Small hepatic cysts and mild extrahepatic biliary dilatation status post cholecystectomy are unchanged.  Musculoskeletal/Chest wall: Postsurgical changes within the breasts as described above. There are multiple thoracolumbar compression deformities. The T7 fracture is unchanged from the prior CT. There is a new compression deformity at T8 resulting in approximately 50% loss vertebral body height. A T10 fracture appears unchanged. Spinal augmentation has been performed at L2. No lytic  lesions are identified. There are several right-sided rib fractures, including an incompletely healed  fracture of the right seventh rib posteriorly.  IMPRESSION: 1. No evidence of metastatic disease within the chest. 2. Chronic lung disease with emphysema, bronchiectasis and subpleural reticulation. Probable superimposed edema or pneumonitis on the prior study has improved. 3. Multiple thoracolumbar compression deformities and right rib fractures. Interval T8 fracture compared with chest CT of 1 year ago. 4. Atherosclerosis and moderate size hiatal hernia again noted.   Electronically Signed   By: Richardean Sale M.D.   On: 04/01/2015 09:35 CLINICAL DATA:  Status post right lumpectomy, chemotherapy and radiation therapy for breast cancer in 2011. Status post left lumpectomy and radiation therapy for breast cancer in 1997.  EXAM: DIGITAL DIAGNOSTIC BILATERAL MAMMOGRAM WITH 3D TOMOSYNTHESIS AND CAD  COMPARISON:  Previous exam(s).  ACR Breast Density Category b: There are scattered areas of fibroglandular density.  FINDINGS: Stable bilateral post lumpectomy changes. No new findings suspicious for malignancy in either breast.  Mammographic images were processed with CAD.  IMPRESSION: No evidence of malignancy.  RECOMMENDATION: Bilateral diagnostic mammogram in 1 year.  I have discussed the findings and recommendations with the patient. Results were also provided in writing at the conclusion of the visit. If applicable, a reminder letter will be sent to the patient regarding the next appointment.  BI-RADS CATEGORY  2: Benign.   Electronically Signed   By: Claudie Revering M.D.   On: 12/05/2014 11:42  ASSESSMENT: 81 y.o. BRCA negative [place] woman:  1.  History of left breast DCIS in 1996  2.  Status post right Breast upper outer quadrant biopsy on 12/19/2009 which showed a high-grade invasive ductal carcinoma with papillary features and high-grade DCIS, ER 0%, PR 0%, Ki-67 96%, HER-2/neu negative by CISH with a ratio of 1.31.  3.  Status post  bilateral breast MRI on 12/29/2009 which showed an enhancing 1.8 x 2.3 x 2.4 cm mass in the upper outer quadrant of the right breast.  No additional masses were seen in the right breast.  There were no enlarged axillary or internal mammary adenopathy.  No abnormal enhancement was seen in the left breast.  4.  The patient underwent genetic counseling and testing and had a negative BRCA1 and BRCA2 gene mutation letter sent to her dated 01/09/2010.  5.  Status post right lower lobe pulmonary biopsy on 02/10/2010 that was negative for malignancy.  6.  Status post 2-D echocardiogram on 02/25/2010 which showed an ejection fraction of 55% to 60%.  7.  Status post neoadjuvant chemotherapy with dose dense FEC x 3 cycles from 03/12/2010 through 04/17/2010 with Neulasta support.  8.  Status post neoadjuvant chemotherapy with FEC x 1 dose on 05/08/2010 with Neulasta support.  9.  Status post neoadjuvant chemotherapy with Abraxane x 9 doses from 06/03/2010 through 08/12/2010.  10.  Status post right lumpectomy with sentinel node biopsy 10/01/2010 which showed a stage 0, ypTis ypN0, high-grade 0.45 cm DCIS, ER 0%, PR 0%, margins were negative for carcinoma, 0/2 positive lymph nodes.  12.  Status post radiation therapy from 11/18/2010 through 01/12/2011.  13.  The patient had a PET scan on 03/14/2012 which showed no specific features identified to suggest residual or recurrence of breast cancer, right anterior rib fractures, left maxillary sinus inflammation.  14.  History of hypokalemia  15.  Anxiety  16.  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 GIB/ IBS/  chronic diarrhea  19. Neuropathy  20. Bilateral DVTs   PLAN: Naketa is now 5 years out from definitive surgery for breast cancer. There is no evidence of disease recurrence. Particularly since she recently had a CT scan, this is very favorable.  At this point I feel  comfortable releasing her to her primary care. All she will need as far as breast cancer follow-up is concern is yearly mammography and a yearly physician breast exam.  I will be glad to see her at any point in the future if on when the need arises but as of now were making no further return appointment for her here. Chauncey Cruel, MD  10/07/2015, 11:59 AM

## 2015-10-27 DIAGNOSIS — E038 Other specified hypothyroidism: Secondary | ICD-10-CM | POA: Diagnosis not present

## 2015-10-27 DIAGNOSIS — J438 Other emphysema: Secondary | ICD-10-CM | POA: Diagnosis not present

## 2015-10-27 DIAGNOSIS — E784 Other hyperlipidemia: Secondary | ICD-10-CM | POA: Diagnosis not present

## 2015-10-27 DIAGNOSIS — E2749 Other adrenocortical insufficiency: Secondary | ICD-10-CM | POA: Diagnosis not present

## 2015-10-27 DIAGNOSIS — Z6823 Body mass index (BMI) 23.0-23.9, adult: Secondary | ICD-10-CM | POA: Diagnosis not present

## 2015-10-27 DIAGNOSIS — Z23 Encounter for immunization: Secondary | ICD-10-CM | POA: Diagnosis not present

## 2015-10-27 DIAGNOSIS — F418 Other specified anxiety disorders: Secondary | ICD-10-CM | POA: Diagnosis not present

## 2015-10-27 DIAGNOSIS — D126 Benign neoplasm of colon, unspecified: Secondary | ICD-10-CM | POA: Diagnosis not present

## 2015-10-27 DIAGNOSIS — R2689 Other abnormalities of gait and mobility: Secondary | ICD-10-CM | POA: Diagnosis not present

## 2015-10-27 DIAGNOSIS — D6489 Other specified anemias: Secondary | ICD-10-CM | POA: Diagnosis not present

## 2015-10-27 DIAGNOSIS — E46 Unspecified protein-calorie malnutrition: Secondary | ICD-10-CM | POA: Diagnosis not present

## 2015-10-27 DIAGNOSIS — C50919 Malignant neoplasm of unspecified site of unspecified female breast: Secondary | ICD-10-CM | POA: Diagnosis not present

## 2015-10-27 DIAGNOSIS — E559 Vitamin D deficiency, unspecified: Secondary | ICD-10-CM | POA: Diagnosis not present

## 2015-10-28 DIAGNOSIS — J439 Emphysema, unspecified: Secondary | ICD-10-CM | POA: Diagnosis not present

## 2015-11-28 DIAGNOSIS — J439 Emphysema, unspecified: Secondary | ICD-10-CM | POA: Diagnosis not present

## 2015-12-01 DIAGNOSIS — J439 Emphysema, unspecified: Secondary | ICD-10-CM | POA: Diagnosis not present

## 2015-12-11 ENCOUNTER — Other Ambulatory Visit: Payer: Self-pay | Admitting: Endocrinology

## 2015-12-11 DIAGNOSIS — Z853 Personal history of malignant neoplasm of breast: Secondary | ICD-10-CM

## 2015-12-18 ENCOUNTER — Ambulatory Visit
Admission: RE | Admit: 2015-12-18 | Discharge: 2015-12-18 | Disposition: A | Discharge status: Home / Self Care | Payer: PPO | Source: Ambulatory Visit | Attending: Endocrinology | Admitting: Endocrinology

## 2015-12-18 DIAGNOSIS — R928 Other abnormal and inconclusive findings on diagnostic imaging of breast: Secondary | ICD-10-CM | POA: Diagnosis not present

## 2015-12-18 DIAGNOSIS — Z853 Personal history of malignant neoplasm of breast: Secondary | ICD-10-CM

## 2015-12-24 DIAGNOSIS — J439 Emphysema, unspecified: Secondary | ICD-10-CM | POA: Diagnosis not present

## 2015-12-24 DIAGNOSIS — Z6822 Body mass index (BMI) 22.0-22.9, adult: Secondary | ICD-10-CM | POA: Diagnosis not present

## 2015-12-24 DIAGNOSIS — J441 Chronic obstructive pulmonary disease with (acute) exacerbation: Secondary | ICD-10-CM | POA: Diagnosis not present

## 2015-12-24 DIAGNOSIS — R05 Cough: Secondary | ICD-10-CM | POA: Diagnosis not present

## 2015-12-28 DIAGNOSIS — J439 Emphysema, unspecified: Secondary | ICD-10-CM | POA: Diagnosis not present

## 2015-12-29 DIAGNOSIS — J439 Emphysema, unspecified: Secondary | ICD-10-CM | POA: Diagnosis not present

## 2016-01-28 DIAGNOSIS — J439 Emphysema, unspecified: Secondary | ICD-10-CM | POA: Diagnosis not present

## 2016-02-02 DIAGNOSIS — F418 Other specified anxiety disorders: Secondary | ICD-10-CM | POA: Diagnosis not present

## 2016-02-02 DIAGNOSIS — D6489 Other specified anemias: Secondary | ICD-10-CM | POA: Diagnosis not present

## 2016-02-02 DIAGNOSIS — C50919 Malignant neoplasm of unspecified site of unspecified female breast: Secondary | ICD-10-CM | POA: Diagnosis not present

## 2016-02-02 DIAGNOSIS — J441 Chronic obstructive pulmonary disease with (acute) exacerbation: Secondary | ICD-10-CM | POA: Diagnosis not present

## 2016-02-02 DIAGNOSIS — I10 Essential (primary) hypertension: Secondary | ICD-10-CM | POA: Diagnosis not present

## 2016-02-02 DIAGNOSIS — E784 Other hyperlipidemia: Secondary | ICD-10-CM | POA: Diagnosis not present

## 2016-02-02 DIAGNOSIS — E559 Vitamin D deficiency, unspecified: Secondary | ICD-10-CM | POA: Diagnosis not present

## 2016-02-02 DIAGNOSIS — E038 Other specified hypothyroidism: Secondary | ICD-10-CM | POA: Diagnosis not present

## 2016-02-02 DIAGNOSIS — E2749 Other adrenocortical insufficiency: Secondary | ICD-10-CM | POA: Diagnosis not present

## 2016-02-02 DIAGNOSIS — H5310 Unspecified subjective visual disturbances: Secondary | ICD-10-CM | POA: Diagnosis not present

## 2016-02-02 DIAGNOSIS — Z6821 Body mass index (BMI) 21.0-21.9, adult: Secondary | ICD-10-CM | POA: Diagnosis not present

## 2016-02-02 DIAGNOSIS — J439 Emphysema, unspecified: Secondary | ICD-10-CM | POA: Diagnosis not present

## 2016-02-06 DIAGNOSIS — H5310 Unspecified subjective visual disturbances: Secondary | ICD-10-CM | POA: Diagnosis not present

## 2016-02-27 DIAGNOSIS — J439 Emphysema, unspecified: Secondary | ICD-10-CM | POA: Diagnosis not present

## 2016-03-29 DIAGNOSIS — J439 Emphysema, unspecified: Secondary | ICD-10-CM | POA: Diagnosis not present

## 2016-04-01 DIAGNOSIS — Z6821 Body mass index (BMI) 21.0-21.9, adult: Secondary | ICD-10-CM | POA: Diagnosis not present

## 2016-04-01 DIAGNOSIS — M810 Age-related osteoporosis without current pathological fracture: Secondary | ICD-10-CM | POA: Diagnosis not present

## 2016-04-01 DIAGNOSIS — Z79899 Other long term (current) drug therapy: Secondary | ICD-10-CM | POA: Diagnosis not present

## 2016-04-21 DIAGNOSIS — R634 Abnormal weight loss: Secondary | ICD-10-CM | POA: Diagnosis not present

## 2016-04-21 DIAGNOSIS — Z6820 Body mass index (BMI) 20.0-20.9, adult: Secondary | ICD-10-CM | POA: Diagnosis not present

## 2016-04-21 DIAGNOSIS — R197 Diarrhea, unspecified: Secondary | ICD-10-CM | POA: Diagnosis not present

## 2016-04-21 DIAGNOSIS — M546 Pain in thoracic spine: Secondary | ICD-10-CM | POA: Diagnosis not present

## 2016-04-22 DIAGNOSIS — R197 Diarrhea, unspecified: Secondary | ICD-10-CM | POA: Diagnosis not present

## 2016-04-23 ENCOUNTER — Other Ambulatory Visit: Payer: Self-pay | Admitting: Endocrinology

## 2016-04-23 DIAGNOSIS — M546 Pain in thoracic spine: Secondary | ICD-10-CM

## 2016-04-27 ENCOUNTER — Ambulatory Visit (HOSPITAL_COMMUNITY): Payer: PPO

## 2016-04-28 DIAGNOSIS — J439 Emphysema, unspecified: Secondary | ICD-10-CM | POA: Diagnosis not present

## 2016-05-04 ENCOUNTER — Ambulatory Visit
Admission: RE | Admit: 2016-05-04 | Discharge: 2016-05-04 | Disposition: A | Discharge status: Home / Self Care | Payer: PPO | Source: Ambulatory Visit | Attending: Endocrinology | Admitting: Endocrinology

## 2016-05-04 DIAGNOSIS — M5124 Other intervertebral disc displacement, thoracic region: Secondary | ICD-10-CM | POA: Diagnosis not present

## 2016-05-04 DIAGNOSIS — M546 Pain in thoracic spine: Secondary | ICD-10-CM

## 2016-05-10 ENCOUNTER — Emergency Department (HOSPITAL_COMMUNITY): Payer: PPO

## 2016-05-10 ENCOUNTER — Emergency Department (HOSPITAL_COMMUNITY)
Admission: EM | Admit: 2016-05-10 | Discharge: 2016-05-10 | Disposition: A | Discharge status: Home / Self Care | Payer: PPO | Attending: Emergency Medicine | Admitting: Emergency Medicine

## 2016-05-10 ENCOUNTER — Encounter (HOSPITAL_COMMUNITY): Payer: Self-pay

## 2016-05-10 DIAGNOSIS — Z87891 Personal history of nicotine dependence: Secondary | ICD-10-CM | POA: Insufficient documentation

## 2016-05-10 DIAGNOSIS — M545 Low back pain, unspecified: Secondary | ICD-10-CM

## 2016-05-10 DIAGNOSIS — Y929 Unspecified place or not applicable: Secondary | ICD-10-CM | POA: Insufficient documentation

## 2016-05-10 DIAGNOSIS — Z853 Personal history of malignant neoplasm of breast: Secondary | ICD-10-CM | POA: Insufficient documentation

## 2016-05-10 DIAGNOSIS — Z79899 Other long term (current) drug therapy: Secondary | ICD-10-CM | POA: Insufficient documentation

## 2016-05-10 DIAGNOSIS — S7001XA Contusion of right hip, initial encounter: Secondary | ICD-10-CM | POA: Insufficient documentation

## 2016-05-10 DIAGNOSIS — Y939 Activity, unspecified: Secondary | ICD-10-CM | POA: Diagnosis not present

## 2016-05-10 DIAGNOSIS — S0003XA Contusion of scalp, initial encounter: Secondary | ICD-10-CM | POA: Diagnosis not present

## 2016-05-10 DIAGNOSIS — S0990XA Unspecified injury of head, initial encounter: Secondary | ICD-10-CM | POA: Diagnosis not present

## 2016-05-10 DIAGNOSIS — Y999 Unspecified external cause status: Secondary | ICD-10-CM | POA: Insufficient documentation

## 2016-05-10 DIAGNOSIS — W1830XA Fall on same level, unspecified, initial encounter: Secondary | ICD-10-CM | POA: Insufficient documentation

## 2016-05-10 DIAGNOSIS — E039 Hypothyroidism, unspecified: Secondary | ICD-10-CM | POA: Diagnosis not present

## 2016-05-10 DIAGNOSIS — Z7982 Long term (current) use of aspirin: Secondary | ICD-10-CM | POA: Insufficient documentation

## 2016-05-10 DIAGNOSIS — S069X9A Unspecified intracranial injury with loss of consciousness of unspecified duration, initial encounter: Secondary | ICD-10-CM | POA: Diagnosis not present

## 2016-05-10 DIAGNOSIS — M25551 Pain in right hip: Secondary | ICD-10-CM | POA: Diagnosis not present

## 2016-05-10 DIAGNOSIS — S79911A Unspecified injury of right hip, initial encounter: Secondary | ICD-10-CM | POA: Diagnosis not present

## 2016-05-10 MED ORDER — BUPIVACAINE HCL (PF) 0.5 % IJ SOLN
20.0000 mL | Freq: Once | INTRAMUSCULAR | Status: AC
  Administered 2016-05-10: 20 mL
  Filled 2016-05-10: qty 30

## 2016-05-10 MED ORDER — NAPROXEN 250 MG PO TABS: 500.0000 mg | ORAL_TABLET | Freq: Two times a day (BID) | ORAL | 0 refills | Status: AC

## 2016-05-10 MED ORDER — ACETAMINOPHEN 500 MG PO TABS: 1000.0000 mg | ORAL_TABLET | Freq: Four times a day (QID) | ORAL | 0 refills | Status: DC | PRN

## 2016-05-10 NOTE — ED Provider Notes (Signed)
[facility] DEPT Provider Note   CSN: [id number] Arrival date & time: 05/10/16  1402     History   Chief Complaint Chief Complaint  Patient presents with  . Fall  . Hip Pain    RIGHT    HPI [name] is a 82 y.o. female.  The history is provided by the patient.  Fall  This is a new problem. Episode onset: 10 days ago. The problem occurs constantly. The problem has been gradually worsening. Associated symptoms comments: Right hip pain progressively worsening and brief LOC with fall. Exacerbated by: immobility. Nothing relieves the symptoms. She has tried nothing for the symptoms.    Past Medical History:  Diagnosis Date  . Anemia   . Asthma   . Breast cancer ([place]) RIGHT SIDE-- DX OCT 2011   CHEMORADIATION THERAPY-- COMPLETED   . CHF (congestive heart failure) ([place])   . Ecchymosis   . Fibromyalgia   . Fibromyalgia   . Frequency of urination   . History of breast cancer LEFT BREAST DCIS  1996  . History of DVT (deep vein thrombosis)   . History of pulmonary embolism   . Hyperlipidemia   . IBS (irritable bowel syndrome)   . Lung mass PER DR CLANCE NOTE UNCLEAR IF LYMPHOMA FROM BX   FOLLOWED BY DR RUBIN  . 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 Nocturia   . Osteoporosis   . PONV (postoperative nausea and vomiting)   . Seasonal allergies   . Seizures ([place])   . Skin tear LEFT ARM COVERED W/ TEGADERM   PT STATES HX MULTIPLE SKIN TEARS -- THIN  . Thin skin SECONARDY AGE AND HX CHEMO  . Unspecified hereditary and idiopathic peripheral neuropathy   . Vertebral compression fracture ([place]) 5/14   L4    Patient Active Problem List   Diagnosis Date Noted  . Sepsis ([place]) 04/10/2014  . History of DVT (deep vein thrombosis)   . Blood poisoning ([place])   . Chronic venous insufficiency 02/15/2014  . Paroxysmal SVT (supraventricular tachycardia) ([place]) 09/11/2013  . Palpitations 08/22/2013  . CAP (community acquired  pneumonia) 05/04/2013  . H. pylori infection 05/04/2013  . DVT, LLE 04/18/2013  . Depression 04/18/2013  . Hypothyroidism 04/18/2013  . UTI (urinary tract infection) 04/10/2013  . Hyponatremia 04/10/2013  . Hypokalemia 04/10/2013  . Leg edema, left 04/10/2013  . Nausea with vomiting 04/10/2013  . Adult failure to thrive 04/10/2013  . Protein-calorie malnutrition, severe ([place]) 04/10/2013  . Renal insufficiency 03/10/2013  . GI bleed 03/10/2013  . UGIB (upper gastrointestinal bleed) 03/10/2013  . Breast cancer of upper-outer quadrant of right female breast ([place]) 01/04/2013  . Neoplasm of left breast, primary tumor staging category Tis: ductal carcinoma in situ (DCIS) 01/04/2013  . Vertebral compression fracture ([place]) 10/30/2012  . Adrenal insufficiency ([place]) 10/30/2012  . Osteoporosis 10/30/2012  . Irritable bowel syndrome 10/30/2012  . Peripheral neuropathy ([place]) 10/30/2012  . Unsteady gait 10/30/2012  . Recurrent falls 10/30/2012  . Chronic cough 07/18/2012  . Snoring 06/14/2012  . Thin skin   . Ecchymosis   . Seasonal allergies   . Frequency of urination   . Nocturia   . Anemia   . Seizures ([place])   . Unspecified hereditary and idiopathic peripheral neuropathy   . Acute blood loss anemia 01/02/2011  . Low serum cortisol level ([place]) 01/02/2011  . HLD (hyperlipidemia) 01/23/2010  . Obstructive sleep apnea 01/23/2010  . MASS, LUNG 01/23/2010  Past Surgical History:  Procedure Laterality Date  . APPENDECTOMY    . BILATERAL CARPAL TUNNEL RELEASE    . BILATERAL LAMINECTOMY/ FORAMINOTOMY  01-14-2004   L4 - L5  . BREAST LUMPECTOMY  1996    LEFT BREAST DCIS   . BRONCHOSCOPY  01-29-2010   W/ BX  . CATARACT EXTRACTION W/ INTRAOCULAR LENS  IMPLANT, BILATERAL    . CHOLECYSTECTOMY    . COLONOSCOPY    . COLONOSCOPY WITH PROPOFOL Left 04/16/2014   Procedure: COLONOSCOPY WITH PROPOFOL;  Surgeon: Winfield Cunas., MD;  Location: [facility] ENDOSCOPY;  Service: Endoscopy;  Laterality:  Left;  . ESOPHAGOGASTRODUODENOSCOPY N/A 03/11/2013   Procedure: ESOPHAGOGASTRODUODENOSCOPY (EGD);  Surgeon: Lear Ng, MD;  Location: [facility] ENDOSCOPY;  Service: Endoscopy;  Laterality: N/A;  . ESOPHAGOGASTRODUODENOSCOPY (EGD) WITH PROPOFOL Left 04/16/2014   Procedure: ESOPHAGOGASTRODUODENOSCOPY (EGD) WITH PROPOFOL;  Surgeon: Winfield Cunas., MD;  Location: [facility] ENDOSCOPY;  Service: Endoscopy;  Laterality: Left;  . FEMUR FRACTURE SURGERY  12-18-2010  DR Tonita Cong   INTRAMEDULLARY NAILING LEFT INTERTROCHANTERIC -SUBTROCHANTERIC FX  . HARDWARE REMOVAL  10/11/2011   Procedure: HARDWARE REMOVAL;  Surgeon: Johnn Hai, MD;  Location: [facility];  Service: Orthopedics;  Laterality: Left;  LEFT THIGH REMOVAL OF DISTAL LOCKING SCREW NEEDS: FLORO, RADIO LUSCENT TABLE AND SCREWDRIVER FOR BIOMET ROD   . PLACEMENT PORT- A- CATH  02-18-2010  . RIGHT BREAST LUMPECTOMY / REMOVAL LYMPH NODES X2/ REMOVAL PAC  10-01-2010   CARCINOMA RIGHT BREAST  . TONSILLECTOMY    . TOTAL ABDOMINAL HYSTERECTOMY W/ BILATERAL SALPINGOOPHORECTOMY  1977  (APPROX)  . TRANSTHORACIC ECHOCARDIOGRAM  02-25-2010   LVSF NORMAL/ EF 31-54%/ GRADE I DIASTOLIC DYSFUNCTION/ MILDLY DILATED LEFT ATRIUM  . TVT VAGINAL TAPE  SUBURETHRAL SLING   06-08-2005   SUI    OB History    No data available       Home Medications    Prior to Admission medications   Medication Sig Start Date End Date Taking? Authorizing Provider  aspirin EC 81 MG tablet Take 81 mg by mouth daily.    Historical Provider, MD  dexlansoprazole (DEXILANT) 60 MG capsule Take 60 mg by mouth daily.     Historical Provider, MD  DULoxetine (CYMBALTA) 60 MG capsule Take 60 mg by mouth daily after lunch.     Historical Provider, MD  ergocalciferol (VITAMIN D2) 50000 UNITS capsule Take 50,000 Units by mouth once a week. Every Tuesday.    Historical Provider, MD  ezetimibe-simvastatin (VYTORIN) 10-80 MG per tablet Take 1 tablet by mouth at bedtime.  Reported on 03/26/2015    Historical Provider, MD  gabapentin (NEURONTIN) 100 MG capsule Take 100 mg by mouth 3 (three) times daily.     Historical Provider, MD  levothyroxine (SYNTHROID, LEVOTHROID) 88 MCG tablet Take 88 mcg by mouth daily before breakfast. For hypothyroid    Historical Provider, MD  LORazepam (ATIVAN) 0.5 MG tablet Take 0.5 mg by mouth every 8 (eight) hours as needed for anxiety. Anxiety 07/04/12   Vivien Rota, NP  predniSONE (DELTASONE) 5 MG tablet Take 5 mg by mouth daily. Reported on 03/26/2015    Historical Provider, MD    Family History Family History  Problem Relation Age of Onset  . Emphysema Father   . CAD Father   . Ovarian cancer Mother   . Cancer Mother     cervical  . Alzheimer's disease Mother   . Hyperlipidemia Mother   . Cancer Sister  cervical  . Breast cancer Maternal Grandmother     Social History Social History  Substance Use Topics  . Smoking status: Former Smoker    Packs/day: 1.00    Years: 30.00    Types: Cigarettes    Quit date: 03/01/1978  . Smokeless tobacco: Never Used  . Alcohol use 4.2 - 6.0 oz/week    7 - 10 Glasses of wine per week     Allergies   Penicillins; Adhesive [tape]; Doxycycline; and Morphine and related   Review of Systems Review of Systems  All other systems reviewed and are negative.    Physical Exam Updated Vital Signs BP 153/78 (BP Location: Right Arm)   Pulse 72   Temp 97.4 F (36.3 C) (Oral)   Resp 18   Ht 5\' 6"  (1.676 m)   Wt 120 lb (54.4 kg)   SpO2 100%   BMI 19.37 kg/m   Physical Exam  Constitutional: She is oriented to person, place, and time. She appears well-developed and well-nourished. No distress.  HENT:  Head: Normocephalic and atraumatic.  Nose: Nose normal.  Eyes: Conjunctivae are normal.  Neck: Neck supple. No tracheal deviation present.  Cardiovascular: Normal rate and regular rhythm.   Pulmonary/Chest: Effort normal. No respiratory distress.  Abdominal: Soft. She  exhibits no distension.  Musculoskeletal:       Lumbar back: She exhibits tenderness (over right low back and gluteal muscles).       Back:  Neurological: She is alert and oriented to person, place, and time.  Skin: Skin is warm and dry.  Psychiatric: She has a normal mood and affect.  Vitals reviewed.    ED Treatments / Results  Labs (all labs ordered are listed, but only abnormal results are displayed) Labs Reviewed - No data to display  EKG  EKG Interpretation None       Radiology Ct Head Wo Contrast  Result Date: 05/10/2016 CLINICAL DATA:  82 y/o F; status post fall with loss of consciousness. EXAM: CT HEAD WITHOUT CONTRAST TECHNIQUE: Contiguous axial images were obtained from the base of the skull through the vertex without intravenous contrast. COMPARISON:  05/11/2014 CT of the head. FINDINGS: Brain: No evidence of acute infarction, hemorrhage, hydrocephalus, extra-axial collection or mass lesion/mass effect. Partially empty sella turcica incidentally noted. Vascular: Calcific atherosclerosis of cavernous and paraclinoid internal carotid arteries. Skull: Normal. Negative for fracture or focal lesion. Sinuses/Orbits: Left frontal and ethmoid sinus mucosal thickening. Opacification of the left maxillary sinus with chronic inflammatory changes of the walls of the sinus. Mastoid air cells are normally aerated. Bilateral intra-ocular lens replacement. Other: None. IMPRESSION: 1. No acute intracranial abnormality or displaced calvarial fracture. 2. Unremarkable CT of the brain for age. 3. Left frontal, ethmoid, and maxillary sinus disease. Electronically Signed   By: Kristine Garbe M.D.   On: 05/10/2016 15:01   Dg Hip Unilat  With Pelvis 2-3 Views Right  Result Date: 05/10/2016 CLINICAL DATA:  Right hip pain since a fall 04/30/2016. Initial encounter. EXAM: DG HIP (WITH OR WITHOUT PELVIS) 2-3V RIGHT COMPARISON:  Single-view of the abdomen 04/13/2014. FINDINGS: There is no  acute bony or joint abnormality. Remote healed left intertrochanteric fracture with fixation hardware in place is noted. Large calcified granulomata project over each buttock. Mild to moderate right hip osteoarthritis is seen. There is lower lumbar degenerative disease and a remote L4 compression fracture which is on the comparison exam. IMPRESSION: No acute abnormality. Electronically Signed   By: Inge Rise M.D.  On: 05/10/2016 14:43    Procedures Procedures (including critical care time)  Procedure Note: Trigger Point Injection for Myofascial pain  Performed by Dr. Laneta Simmers Indication: muscle/myofascial pain Muscle body and tendon sheath of the right gluteal and lumbar paraspinal muscle(s) were injected with 0.5% bupivacaine under sterile technique for release of muscle spasm/pain. Patient tolerated well with immediate improvement of symptoms and no immediate complications following procedure.  CPT Code:   1 or 2 muscle bodies: 20552   Medications Ordered in ED Medications  bupivacaine (MARCAINE) 0.5 % injection 20 mL (20 mLs Infiltration Given 05/10/16 1536)     Initial Impression / Assessment and Plan / ED Course  I have reviewed the triage vital signs and the nursing notes.  Pertinent labs & imaging results that were available during my care of the patient were reviewed by me and considered in my medical decision making (see chart for details).     82 year old female presents with fall 10 days ago that initially was painful but has progressively worsened over the last 4-5 days. She states that she has been sitting in her chair for the majority of the day and only gets up to go to the kitchen in the bathroom. She has had difficulty bearing weight on her right hip because of worsening pain. She hasn't taken any medications as she states she does not like taking pills. Plain films of the hip and pelvis are negative, CT of the head was negative. She is well-appearing and she is  point tender over her right buttock and low back. Local anesthesia was applied to the area with some improvement of her symptoms. Patient was recommended to take short course of scheduled NSAIDs and engage in early mobility as definitive treatment. She'll be given a referral to physical therapy at home to work on her mobility and help with her symptoms which appear to be muscular in nature. Plan to follow up with PCP as needed and return precautions discussed for worsening or new concerning symptoms.   Final Clinical Impressions(s) / ED Diagnoses   Final diagnoses:  Contusion of right hip, initial encounter  Contusion of scalp, initial encounter  Acute right-sided low back pain without sciatica    New Prescriptions New Prescriptions   ACETAMINOPHEN (TYLENOL) 500 MG TABLET    Take 2 tablets (1,000 mg total) by mouth every 6 (six) hours as needed for mild pain or moderate pain.   NAPROXEN (NAPROSYN) 250 MG TABLET    Take 2 tablets (500 mg total) by mouth 2 (two) times daily with a meal.     Leo Grosser, MD 05/10/16 [phone]

## 2016-05-10 NOTE — ED Triage Notes (Addendum)
PT C/O CONTINUED RIGHT HIP PAIN AFTER FALLING ON 04/30/16. PT STS SHE HAS HAD MULTIPLE FALLS LATELY, AND IS NOT ABLE TO BEAR WEIGHT ON THE RIGHT LEG. PT STS SHE DID HIT HER HEAD WITH +LOC. PT TAKES AN ASPIRIN 81MG  DAILY.

## 2016-05-11 DIAGNOSIS — H43813 Vitreous degeneration, bilateral: Secondary | ICD-10-CM | POA: Diagnosis not present

## 2016-05-11 DIAGNOSIS — D3131 Benign neoplasm of right choroid: Secondary | ICD-10-CM | POA: Diagnosis not present

## 2016-05-11 DIAGNOSIS — H35372 Puckering of macula, left eye: Secondary | ICD-10-CM | POA: Diagnosis not present

## 2016-05-18 DIAGNOSIS — M6281 Muscle weakness (generalized): Secondary | ICD-10-CM | POA: Diagnosis not present

## 2016-05-18 DIAGNOSIS — M546 Pain in thoracic spine: Secondary | ICD-10-CM | POA: Diagnosis not present

## 2016-05-18 DIAGNOSIS — R2681 Unsteadiness on feet: Secondary | ICD-10-CM | POA: Diagnosis not present

## 2016-05-18 DIAGNOSIS — S22000D Wedge compression fracture of unspecified thoracic vertebra, subsequent encounter for fracture with routine healing: Secondary | ICD-10-CM | POA: Diagnosis not present

## 2016-05-18 DIAGNOSIS — I10 Essential (primary) hypertension: Secondary | ICD-10-CM | POA: Diagnosis not present

## 2016-05-18 DIAGNOSIS — R634 Abnormal weight loss: Secondary | ICD-10-CM | POA: Diagnosis not present

## 2016-05-24 DIAGNOSIS — R634 Abnormal weight loss: Secondary | ICD-10-CM | POA: Diagnosis not present

## 2016-05-24 DIAGNOSIS — M6281 Muscle weakness (generalized): Secondary | ICD-10-CM | POA: Diagnosis not present

## 2016-05-24 DIAGNOSIS — I10 Essential (primary) hypertension: Secondary | ICD-10-CM | POA: Diagnosis not present

## 2016-05-24 DIAGNOSIS — S22000D Wedge compression fracture of unspecified thoracic vertebra, subsequent encounter for fracture with routine healing: Secondary | ICD-10-CM | POA: Diagnosis not present

## 2016-05-24 DIAGNOSIS — M546 Pain in thoracic spine: Secondary | ICD-10-CM | POA: Diagnosis not present

## 2016-05-24 DIAGNOSIS — R2681 Unsteadiness on feet: Secondary | ICD-10-CM | POA: Diagnosis not present

## 2016-05-27 DIAGNOSIS — J439 Emphysema, unspecified: Secondary | ICD-10-CM | POA: Diagnosis not present

## 2016-05-31 DIAGNOSIS — R2681 Unsteadiness on feet: Secondary | ICD-10-CM | POA: Diagnosis not present

## 2016-05-31 DIAGNOSIS — I10 Essential (primary) hypertension: Secondary | ICD-10-CM | POA: Diagnosis not present

## 2016-05-31 DIAGNOSIS — M546 Pain in thoracic spine: Secondary | ICD-10-CM | POA: Diagnosis not present

## 2016-05-31 DIAGNOSIS — S22000D Wedge compression fracture of unspecified thoracic vertebra, subsequent encounter for fracture with routine healing: Secondary | ICD-10-CM | POA: Diagnosis not present

## 2016-05-31 DIAGNOSIS — M6281 Muscle weakness (generalized): Secondary | ICD-10-CM | POA: Diagnosis not present

## 2016-05-31 DIAGNOSIS — R634 Abnormal weight loss: Secondary | ICD-10-CM | POA: Diagnosis not present

## 2016-06-07 DIAGNOSIS — R2681 Unsteadiness on feet: Secondary | ICD-10-CM | POA: Diagnosis not present

## 2016-06-07 DIAGNOSIS — I10 Essential (primary) hypertension: Secondary | ICD-10-CM | POA: Diagnosis not present

## 2016-06-07 DIAGNOSIS — M6281 Muscle weakness (generalized): Secondary | ICD-10-CM | POA: Diagnosis not present

## 2016-06-07 DIAGNOSIS — M546 Pain in thoracic spine: Secondary | ICD-10-CM | POA: Diagnosis not present

## 2016-06-07 DIAGNOSIS — R634 Abnormal weight loss: Secondary | ICD-10-CM | POA: Diagnosis not present

## 2016-06-07 DIAGNOSIS — S22000D Wedge compression fracture of unspecified thoracic vertebra, subsequent encounter for fracture with routine healing: Secondary | ICD-10-CM | POA: Diagnosis not present

## 2016-06-14 DIAGNOSIS — S22000D Wedge compression fracture of unspecified thoracic vertebra, subsequent encounter for fracture with routine healing: Secondary | ICD-10-CM | POA: Diagnosis not present

## 2016-06-14 DIAGNOSIS — M6281 Muscle weakness (generalized): Secondary | ICD-10-CM | POA: Diagnosis not present

## 2016-06-14 DIAGNOSIS — M546 Pain in thoracic spine: Secondary | ICD-10-CM | POA: Diagnosis not present

## 2016-06-14 DIAGNOSIS — R634 Abnormal weight loss: Secondary | ICD-10-CM | POA: Diagnosis not present

## 2016-06-14 DIAGNOSIS — R2681 Unsteadiness on feet: Secondary | ICD-10-CM | POA: Diagnosis not present

## 2016-06-14 DIAGNOSIS — I10 Essential (primary) hypertension: Secondary | ICD-10-CM | POA: Diagnosis not present

## 2016-06-15 DIAGNOSIS — C50919 Malignant neoplasm of unspecified site of unspecified female breast: Secondary | ICD-10-CM | POA: Diagnosis not present

## 2016-06-15 DIAGNOSIS — K529 Noninfective gastroenteritis and colitis, unspecified: Secondary | ICD-10-CM | POA: Diagnosis not present

## 2016-06-15 DIAGNOSIS — Z6821 Body mass index (BMI) 21.0-21.9, adult: Secondary | ICD-10-CM | POA: Diagnosis not present

## 2016-06-15 DIAGNOSIS — E038 Other specified hypothyroidism: Secondary | ICD-10-CM | POA: Diagnosis not present

## 2016-06-15 DIAGNOSIS — M810 Age-related osteoporosis without current pathological fracture: Secondary | ICD-10-CM | POA: Diagnosis not present

## 2016-06-15 DIAGNOSIS — D126 Benign neoplasm of colon, unspecified: Secondary | ICD-10-CM | POA: Diagnosis not present

## 2016-06-15 DIAGNOSIS — E784 Other hyperlipidemia: Secondary | ICD-10-CM | POA: Diagnosis not present

## 2016-06-15 DIAGNOSIS — I10 Essential (primary) hypertension: Secondary | ICD-10-CM | POA: Diagnosis not present

## 2016-06-15 DIAGNOSIS — I739 Peripheral vascular disease, unspecified: Secondary | ICD-10-CM | POA: Diagnosis not present

## 2016-06-15 DIAGNOSIS — E46 Unspecified protein-calorie malnutrition: Secondary | ICD-10-CM | POA: Diagnosis not present

## 2016-06-15 DIAGNOSIS — J441 Chronic obstructive pulmonary disease with (acute) exacerbation: Secondary | ICD-10-CM | POA: Diagnosis not present

## 2016-06-15 DIAGNOSIS — F418 Other specified anxiety disorders: Secondary | ICD-10-CM | POA: Diagnosis not present

## 2016-06-17 DIAGNOSIS — J439 Emphysema, unspecified: Secondary | ICD-10-CM | POA: Diagnosis not present

## 2016-06-23 DIAGNOSIS — R634 Abnormal weight loss: Secondary | ICD-10-CM | POA: Diagnosis not present

## 2016-06-23 DIAGNOSIS — M6281 Muscle weakness (generalized): Secondary | ICD-10-CM | POA: Diagnosis not present

## 2016-06-23 DIAGNOSIS — S22000D Wedge compression fracture of unspecified thoracic vertebra, subsequent encounter for fracture with routine healing: Secondary | ICD-10-CM | POA: Diagnosis not present

## 2016-06-23 DIAGNOSIS — M546 Pain in thoracic spine: Secondary | ICD-10-CM | POA: Diagnosis not present

## 2016-06-23 DIAGNOSIS — I10 Essential (primary) hypertension: Secondary | ICD-10-CM | POA: Diagnosis not present

## 2016-06-23 DIAGNOSIS — R2681 Unsteadiness on feet: Secondary | ICD-10-CM | POA: Diagnosis not present

## 2016-06-27 DIAGNOSIS — J439 Emphysema, unspecified: Secondary | ICD-10-CM | POA: Diagnosis not present

## 2016-06-29 DIAGNOSIS — S22000D Wedge compression fracture of unspecified thoracic vertebra, subsequent encounter for fracture with routine healing: Secondary | ICD-10-CM | POA: Diagnosis not present

## 2016-06-29 DIAGNOSIS — M6281 Muscle weakness (generalized): Secondary | ICD-10-CM | POA: Diagnosis not present

## 2016-06-29 DIAGNOSIS — I10 Essential (primary) hypertension: Secondary | ICD-10-CM | POA: Diagnosis not present

## 2016-06-29 DIAGNOSIS — R2681 Unsteadiness on feet: Secondary | ICD-10-CM | POA: Diagnosis not present

## 2016-06-29 DIAGNOSIS — M546 Pain in thoracic spine: Secondary | ICD-10-CM | POA: Diagnosis not present

## 2016-06-29 DIAGNOSIS — R634 Abnormal weight loss: Secondary | ICD-10-CM | POA: Diagnosis not present

## 2016-07-06 DIAGNOSIS — S22000D Wedge compression fracture of unspecified thoracic vertebra, subsequent encounter for fracture with routine healing: Secondary | ICD-10-CM | POA: Diagnosis not present

## 2016-07-06 DIAGNOSIS — I10 Essential (primary) hypertension: Secondary | ICD-10-CM | POA: Diagnosis not present

## 2016-07-06 DIAGNOSIS — M546 Pain in thoracic spine: Secondary | ICD-10-CM | POA: Diagnosis not present

## 2016-07-06 DIAGNOSIS — R2681 Unsteadiness on feet: Secondary | ICD-10-CM | POA: Diagnosis not present

## 2016-07-06 DIAGNOSIS — M6281 Muscle weakness (generalized): Secondary | ICD-10-CM | POA: Diagnosis not present

## 2016-07-06 DIAGNOSIS — R634 Abnormal weight loss: Secondary | ICD-10-CM | POA: Diagnosis not present

## 2016-07-08 DIAGNOSIS — M546 Pain in thoracic spine: Secondary | ICD-10-CM | POA: Diagnosis not present

## 2016-07-12 ENCOUNTER — Encounter (HOSPITAL_COMMUNITY): Payer: Self-pay

## 2016-07-12 ENCOUNTER — Ambulatory Visit (HOSPITAL_COMMUNITY)
Admission: RE | Admit: 2016-07-12 | Discharge: 2016-07-12 | Disposition: A | Discharge status: Home / Self Care | Payer: PPO | Source: Ambulatory Visit | Attending: Endocrinology | Admitting: Endocrinology

## 2016-07-12 DIAGNOSIS — M810 Age-related osteoporosis without current pathological fracture: Secondary | ICD-10-CM | POA: Insufficient documentation

## 2016-07-12 MED ORDER — ZOLEDRONIC ACID 5 MG/100ML IV SOLN
5.0000 mg | Freq: Once | INTRAVENOUS | Status: AC
  Administered 2016-07-12: 5 mg via INTRAVENOUS
  Filled 2016-07-12: qty 100

## 2016-07-12 MED ORDER — SODIUM CHLORIDE 0.9 % IV SOLN
Freq: Once | INTRAVENOUS | Status: AC
  Administered 2016-07-12: 14:00:00 via INTRAVENOUS

## 2016-07-12 NOTE — Progress Notes (Signed)
reclast given, no reactions noted.  Pt given d/c instructions on reclast.  Pt takes daily vitamin D and calcium.  Pt was d/c ambulatory with rolling walker to lobby.

## 2016-07-12 NOTE — Discharge Instructions (Signed)
Zoledronic Acid injection (Paget's Disease, Osteoporosis) / Reclast infusion  °What is this medicine? °ZOLEDRONIC ACID (ZOE le dron ik AS id) lowers the amount of calcium loss from bone. It is used to treat Paget's disease and osteoporosis in women. °This medicine may be used for other purposes; ask your health care provider or pharmacist if you have questions. °COMMON BRAND NAME(S): Reclast, Zometa °What should I tell my health care provider before I take this medicine? °They need to know if you have any of these conditions: °-aspirin-sensitive asthma °-cancer, especially if you are receiving medicines used to treat cancer °-dental disease or wear dentures °-infection °-kidney disease °-low levels of calcium in the blood °-past surgery on the parathyroid gland or intestines °-receiving corticosteroids like dexamethasone or prednisone °-an unusual or allergic reaction to zoledronic acid, other medicines, foods, dyes, or preservatives °-pregnant or trying to get pregnant °-breast-feeding °How should I use this medicine? °This medicine is for infusion into a vein. It is given by a health care professional in a hospital or clinic setting. °Talk to your pediatrician regarding the use of this medicine in children. This medicine is not approved for use in children. °Overdosage: If you think you have taken too much of this medicine contact a poison control center or emergency room at once. °NOTE: This medicine is only for you. Do not share this medicine with others. °What if I miss a dose? °It is important not to miss your dose. Call your doctor or health care professional if you are unable to keep an appointment. °What may interact with this medicine? °-certain antibiotics given by injection °-NSAIDs, medicines for pain and inflammation, like ibuprofen or naproxen °-some diuretics like bumetanide, furosemide °-teriparatide °This list may not describe all possible interactions. Give your health care provider a list of all  the medicines, herbs, non-prescription drugs, or dietary supplements you use. Also tell them if you smoke, drink alcohol, or use illegal drugs. Some items may interact with your medicine. °What should I watch for while using this medicine? °Visit your doctor or health care professional for regular checkups. It may be some time before you see the benefit from this medicine. Do not stop taking your medicine unless your doctor tells you to. Your doctor may order blood tests or other tests to see how you are doing. °Women should inform their doctor if they wish to become pregnant or think they might be pregnant. There is a potential for serious side effects to an unborn child. Talk to your health care professional or pharmacist for more information. °You should make sure that you get enough calcium and vitamin D while you are taking this medicine. Discuss the foods you eat and the vitamins you take with your health care professional. °Some people who take this medicine have severe bone, joint, and/or muscle pain. This medicine may also increase your risk for jaw problems or a broken thigh bone. Tell your doctor right away if you have severe pain in your jaw, bones, joints, or muscles. Tell your doctor if you have any pain that does not go away or that gets worse. °Tell your dentist and dental surgeon that you are taking this medicine. You should not have major dental surgery while on this medicine. See your dentist to have a dental exam and fix any dental problems before starting this medicine. Take good care of your teeth while on this medicine. Make sure you see your dentist for regular follow-up appointments. °What side effects may I notice   from receiving this medicine? °Side effects that you should report to your doctor or health care professional as soon as possible: °-allergic reactions like skin rash, itching or hives, swelling of the face, lips, or tongue °-anxiety, confusion, or depression °-breathing  problems °-changes in vision °-eye pain °-feeling faint or lightheaded, falls °-jaw pain, especially after dental work °-mouth sores °-muscle cramps, stiffness, or weakness °-redness, blistering, peeling or loosening of the skin, including inside the mouth °-trouble passing urine or change in the amount of urine °Side effects that usually do not require medical attention (report to your doctor or health care professional if they continue or are bothersome): °-bone, joint, or muscle pain °-constipation °-diarrhea °-fever °-hair loss °-irritation at site where injected °-loss of appetite °-nausea, vomiting °-stomach upset °-trouble sleeping °-trouble swallowing °-weak or tired °This list may not describe all possible side effects. Call your doctor for medical advice about side effects. You may report side effects to FDA at 1-800-FDA-1088. °Where should I keep my medicine? °This drug is given in a hospital or clinic and will not be stored at home. °NOTE: This sheet is a summary. It may not cover all possible information. If you have questions about this medicine, talk to your doctor, pharmacist, or health care provider. °© 2018 Elsevier/Gold Standard (2013-07-14 14:19:57) ° °

## 2016-07-13 DIAGNOSIS — R634 Abnormal weight loss: Secondary | ICD-10-CM | POA: Diagnosis not present

## 2016-07-13 DIAGNOSIS — S22000D Wedge compression fracture of unspecified thoracic vertebra, subsequent encounter for fracture with routine healing: Secondary | ICD-10-CM | POA: Diagnosis not present

## 2016-07-13 DIAGNOSIS — M546 Pain in thoracic spine: Secondary | ICD-10-CM | POA: Diagnosis not present

## 2016-07-13 DIAGNOSIS — I10 Essential (primary) hypertension: Secondary | ICD-10-CM | POA: Diagnosis not present

## 2016-07-13 DIAGNOSIS — R2681 Unsteadiness on feet: Secondary | ICD-10-CM | POA: Diagnosis not present

## 2016-07-13 DIAGNOSIS — M6281 Muscle weakness (generalized): Secondary | ICD-10-CM | POA: Diagnosis not present

## 2016-07-23 DIAGNOSIS — M5136 Other intervertebral disc degeneration, lumbar region: Secondary | ICD-10-CM | POA: Diagnosis not present

## 2016-07-23 DIAGNOSIS — M5416 Radiculopathy, lumbar region: Secondary | ICD-10-CM | POA: Diagnosis not present

## 2016-07-23 DIAGNOSIS — M48061 Spinal stenosis, lumbar region without neurogenic claudication: Secondary | ICD-10-CM | POA: Diagnosis not present

## 2016-07-23 DIAGNOSIS — M4726 Other spondylosis with radiculopathy, lumbar region: Secondary | ICD-10-CM | POA: Diagnosis not present

## 2016-07-27 DIAGNOSIS — J439 Emphysema, unspecified: Secondary | ICD-10-CM | POA: Diagnosis not present

## 2016-08-04 DIAGNOSIS — H35372 Puckering of macula, left eye: Secondary | ICD-10-CM | POA: Diagnosis not present

## 2016-08-04 DIAGNOSIS — D3131 Benign neoplasm of right choroid: Secondary | ICD-10-CM | POA: Diagnosis not present

## 2016-08-06 DIAGNOSIS — H5213 Myopia, bilateral: Secondary | ICD-10-CM | POA: Diagnosis not present

## 2016-08-06 DIAGNOSIS — H35372 Puckering of macula, left eye: Secondary | ICD-10-CM | POA: Diagnosis not present

## 2016-08-06 DIAGNOSIS — H5310 Unspecified subjective visual disturbances: Secondary | ICD-10-CM | POA: Diagnosis not present

## 2016-08-10 ENCOUNTER — Encounter (HOSPITAL_BASED_OUTPATIENT_CLINIC_OR_DEPARTMENT_OTHER): Payer: PPO | Attending: Surgery

## 2016-08-10 ENCOUNTER — Other Ambulatory Visit: Payer: Self-pay | Admitting: Ophthalmology

## 2016-08-10 DIAGNOSIS — G4730 Sleep apnea, unspecified: Secondary | ICD-10-CM | POA: Diagnosis not present

## 2016-08-10 DIAGNOSIS — S41111A Laceration without foreign body of right upper arm, initial encounter: Secondary | ICD-10-CM | POA: Diagnosis not present

## 2016-08-10 DIAGNOSIS — S61402A Unspecified open wound of left hand, initial encounter: Secondary | ICD-10-CM | POA: Insufficient documentation

## 2016-08-10 DIAGNOSIS — I739 Peripheral vascular disease, unspecified: Secondary | ICD-10-CM | POA: Insufficient documentation

## 2016-08-10 DIAGNOSIS — S40212A Abrasion of left shoulder, initial encounter: Secondary | ICD-10-CM | POA: Diagnosis not present

## 2016-08-10 DIAGNOSIS — Z86718 Personal history of other venous thrombosis and embolism: Secondary | ICD-10-CM | POA: Insufficient documentation

## 2016-08-10 DIAGNOSIS — I10 Essential (primary) hypertension: Secondary | ICD-10-CM | POA: Insufficient documentation

## 2016-08-10 DIAGNOSIS — W19XXXA Unspecified fall, initial encounter: Secondary | ICD-10-CM | POA: Insufficient documentation

## 2016-08-10 DIAGNOSIS — S0081XA Abrasion of other part of head, initial encounter: Secondary | ICD-10-CM | POA: Diagnosis not present

## 2016-08-10 DIAGNOSIS — Z9221 Personal history of antineoplastic chemotherapy: Secondary | ICD-10-CM | POA: Diagnosis not present

## 2016-08-10 DIAGNOSIS — G629 Polyneuropathy, unspecified: Secondary | ICD-10-CM | POA: Insufficient documentation

## 2016-08-10 DIAGNOSIS — J449 Chronic obstructive pulmonary disease, unspecified: Secondary | ICD-10-CM | POA: Diagnosis not present

## 2016-08-10 DIAGNOSIS — H547 Unspecified visual loss: Secondary | ICD-10-CM

## 2016-08-10 DIAGNOSIS — S51802A Unspecified open wound of left forearm, initial encounter: Secondary | ICD-10-CM | POA: Insufficient documentation

## 2016-08-10 DIAGNOSIS — Z923 Personal history of irradiation: Secondary | ICD-10-CM | POA: Insufficient documentation

## 2016-08-10 DIAGNOSIS — S51801A Unspecified open wound of right forearm, initial encounter: Secondary | ICD-10-CM | POA: Insufficient documentation

## 2016-08-10 DIAGNOSIS — S50312A Abrasion of left elbow, initial encounter: Secondary | ICD-10-CM | POA: Diagnosis not present

## 2016-08-10 DIAGNOSIS — S0180XA Unspecified open wound of other part of head, initial encounter: Secondary | ICD-10-CM | POA: Insufficient documentation

## 2016-08-10 DIAGNOSIS — S41112A Laceration without foreign body of left upper arm, initial encounter: Secondary | ICD-10-CM | POA: Diagnosis not present

## 2016-08-10 DIAGNOSIS — S60811A Abrasion of right wrist, initial encounter: Secondary | ICD-10-CM | POA: Diagnosis not present

## 2016-08-10 DIAGNOSIS — S60512A Abrasion of left hand, initial encounter: Secondary | ICD-10-CM | POA: Diagnosis not present

## 2016-08-11 DIAGNOSIS — S81809A Unspecified open wound, unspecified lower leg, initial encounter: Secondary | ICD-10-CM | POA: Diagnosis not present

## 2016-08-16 DIAGNOSIS — H5310 Unspecified subjective visual disturbances: Secondary | ICD-10-CM | POA: Diagnosis not present

## 2016-08-16 DIAGNOSIS — H35372 Puckering of macula, left eye: Secondary | ICD-10-CM | POA: Diagnosis not present

## 2016-08-17 DIAGNOSIS — S50812A Abrasion of left forearm, initial encounter: Secondary | ICD-10-CM | POA: Diagnosis not present

## 2016-08-17 DIAGNOSIS — S40212A Abrasion of left shoulder, initial encounter: Secondary | ICD-10-CM | POA: Diagnosis not present

## 2016-08-17 DIAGNOSIS — S50312A Abrasion of left elbow, initial encounter: Secondary | ICD-10-CM | POA: Diagnosis not present

## 2016-08-17 DIAGNOSIS — S60512A Abrasion of left hand, initial encounter: Secondary | ICD-10-CM | POA: Diagnosis not present

## 2016-08-17 DIAGNOSIS — S41111A Laceration without foreign body of right upper arm, initial encounter: Secondary | ICD-10-CM | POA: Diagnosis not present

## 2016-08-17 DIAGNOSIS — S60811A Abrasion of right wrist, initial encounter: Secondary | ICD-10-CM | POA: Diagnosis not present

## 2016-08-25 ENCOUNTER — Ambulatory Visit
Admission: RE | Admit: 2016-08-25 | Discharge: 2016-08-25 | Disposition: A | Discharge status: Home / Self Care | Payer: PPO | Source: Ambulatory Visit | Attending: Ophthalmology | Admitting: Ophthalmology

## 2016-08-25 DIAGNOSIS — H547 Unspecified visual loss: Secondary | ICD-10-CM

## 2016-08-25 DIAGNOSIS — J320 Chronic maxillary sinusitis: Secondary | ICD-10-CM | POA: Diagnosis not present

## 2016-08-25 MED ORDER — GADOBENATE DIMEGLUMINE 529 MG/ML IV SOLN
10.0000 mL | Freq: Once | INTRAVENOUS | Status: AC | PRN
  Administered 2016-08-25: 10 mL via INTRAVENOUS

## 2016-08-27 DIAGNOSIS — J439 Emphysema, unspecified: Secondary | ICD-10-CM | POA: Diagnosis not present

## 2016-09-06 DIAGNOSIS — H5452A1 Low vision left eye category 1, normal vision right eye: Secondary | ICD-10-CM | POA: Diagnosis not present

## 2016-09-07 DIAGNOSIS — C50411 Malignant neoplasm of upper-outer quadrant of right female breast: Secondary | ICD-10-CM | POA: Diagnosis not present

## 2016-09-07 DIAGNOSIS — R634 Abnormal weight loss: Secondary | ICD-10-CM | POA: Diagnosis not present

## 2016-09-07 DIAGNOSIS — E2749 Other adrenocortical insufficiency: Secondary | ICD-10-CM | POA: Diagnosis not present

## 2016-09-07 DIAGNOSIS — Z1389 Encounter for screening for other disorder: Secondary | ICD-10-CM | POA: Diagnosis not present

## 2016-09-07 DIAGNOSIS — I480 Paroxysmal atrial fibrillation: Secondary | ICD-10-CM | POA: Diagnosis not present

## 2016-09-07 DIAGNOSIS — I739 Peripheral vascular disease, unspecified: Secondary | ICD-10-CM | POA: Diagnosis not present

## 2016-09-07 DIAGNOSIS — E038 Other specified hypothyroidism: Secondary | ICD-10-CM | POA: Diagnosis not present

## 2016-09-07 DIAGNOSIS — J439 Emphysema, unspecified: Secondary | ICD-10-CM | POA: Diagnosis not present

## 2016-09-07 DIAGNOSIS — F418 Other specified anxiety disorders: Secondary | ICD-10-CM | POA: Diagnosis not present

## 2016-09-07 DIAGNOSIS — E784 Other hyperlipidemia: Secondary | ICD-10-CM | POA: Diagnosis not present

## 2016-09-07 DIAGNOSIS — I10 Essential (primary) hypertension: Secondary | ICD-10-CM | POA: Diagnosis not present

## 2016-09-07 DIAGNOSIS — D126 Benign neoplasm of colon, unspecified: Secondary | ICD-10-CM | POA: Diagnosis not present

## 2016-10-14 DIAGNOSIS — H353124 Nonexudative age-related macular degeneration, left eye, advanced atrophic with subfoveal involvement: Secondary | ICD-10-CM | POA: Diagnosis not present

## 2016-10-14 DIAGNOSIS — H353112 Nonexudative age-related macular degeneration, right eye, intermediate dry stage: Secondary | ICD-10-CM | POA: Diagnosis not present

## 2016-10-14 DIAGNOSIS — Z961 Presence of intraocular lens: Secondary | ICD-10-CM | POA: Diagnosis not present

## 2016-10-14 DIAGNOSIS — H353132 Nonexudative age-related macular degeneration, bilateral, intermediate dry stage: Secondary | ICD-10-CM | POA: Diagnosis not present

## 2016-11-16 ENCOUNTER — Other Ambulatory Visit: Payer: Self-pay | Admitting: Endocrinology

## 2016-11-16 DIAGNOSIS — Z853 Personal history of malignant neoplasm of breast: Secondary | ICD-10-CM

## 2016-12-14 DIAGNOSIS — D126 Benign neoplasm of colon, unspecified: Secondary | ICD-10-CM | POA: Diagnosis not present

## 2016-12-14 DIAGNOSIS — C50411 Malignant neoplasm of upper-outer quadrant of right female breast: Secondary | ICD-10-CM | POA: Diagnosis not present

## 2016-12-14 DIAGNOSIS — E2749 Other adrenocortical insufficiency: Secondary | ICD-10-CM | POA: Diagnosis not present

## 2016-12-14 DIAGNOSIS — E038 Other specified hypothyroidism: Secondary | ICD-10-CM | POA: Diagnosis not present

## 2016-12-14 DIAGNOSIS — M810 Age-related osteoporosis without current pathological fracture: Secondary | ICD-10-CM | POA: Diagnosis not present

## 2016-12-14 DIAGNOSIS — I10 Essential (primary) hypertension: Secondary | ICD-10-CM | POA: Diagnosis not present

## 2016-12-14 DIAGNOSIS — E041 Nontoxic single thyroid nodule: Secondary | ICD-10-CM | POA: Diagnosis not present

## 2016-12-14 DIAGNOSIS — I480 Paroxysmal atrial fibrillation: Secondary | ICD-10-CM | POA: Diagnosis not present

## 2016-12-14 DIAGNOSIS — E559 Vitamin D deficiency, unspecified: Secondary | ICD-10-CM | POA: Diagnosis not present

## 2016-12-14 DIAGNOSIS — J441 Chronic obstructive pulmonary disease with (acute) exacerbation: Secondary | ICD-10-CM | POA: Diagnosis not present

## 2016-12-14 DIAGNOSIS — K529 Noninfective gastroenteritis and colitis, unspecified: Secondary | ICD-10-CM | POA: Diagnosis not present

## 2016-12-14 DIAGNOSIS — F418 Other specified anxiety disorders: Secondary | ICD-10-CM | POA: Diagnosis not present

## 2016-12-20 ENCOUNTER — Ambulatory Visit
Admission: RE | Admit: 2016-12-20 | Discharge: 2016-12-20 | Disposition: A | Discharge status: Home / Self Care | Payer: PPO | Source: Ambulatory Visit | Attending: Endocrinology | Admitting: Endocrinology

## 2016-12-20 DIAGNOSIS — Z853 Personal history of malignant neoplasm of breast: Secondary | ICD-10-CM

## 2016-12-20 DIAGNOSIS — R928 Other abnormal and inconclusive findings on diagnostic imaging of breast: Secondary | ICD-10-CM | POA: Diagnosis not present

## 2017-02-09 ENCOUNTER — Encounter: Payer: Self-pay | Admitting: *Deleted

## 2017-02-09 NOTE — Patient Outreach (Signed)
[facility]) Care Management  02/09/2017  [place] 1935/01/12 [id number]   Telephone Screen  Referral Date: 02/09/17 Referral Source: Epi Source (HTA) Referral Reason: Uncontrolled co-morbidities, COPD, Pain. Member has uncontrolled COPD, chronic pain and is a fall risk with a MAHC score of 8. Insurance: HTA   Outreach attempt # 1 to patient and HIPAA identifiers verified. Patient confirmed having a visit with a Nurse Practitioner (NP). She has been waiting on the results of the assessment. Patient was thankful for the visit from the NP. She voiced having swelling in her lower extremities. She wears support hoses to assist with decreasing the fluid build-up, per patient. Patient stated, she's had respiratory complications with a chronic cough. She is unable to purchase Symbicort and has been receiving samples from her primary MD. Patient was referred to a Pulmonologist and her appointment is scheduled for 02/11/17. Patient discussed having palpitations and elevation in her blood pressure. She doesn't have a Cardiologist and she believes that she needs to be referred to a Cardiologist. Patient explained having a history of cancer and receiving chemotherapy. She believes the chemotherapy negatively affected her holistically. Patient reported being prescribed Prednisone, long term. Patient stated, she doesn't take the Prednisone and other medications as prescribed. She stated, "I don't like taking to many medications". RN CM spoke with patient about the importance of taking medications as prescribed, especially Prednisone due to the rebound effects. Patient's last appointment with primary MD was in November 2018 and her next appointment is 04/13/16. [facility] services and benefits explained to patient. Patient agreed to services.    Plan: RN CM will contact patient within one week.  RN CM advised patient to contact RN CM for any needs or concerns. RN CM advised patient to alert  MD for any changes in conditions.  RM CM will send successful outreach letter to patient with introductory [facility] welcome package. RN CM will notify [facility] Case Management Assistant of case status. RN CM notified [facility] Case Management Assistant: patient agreed to services and case opened. RN CM will send [facility] pharmacy referral for assistance with affording medication.   [place] Bells, RN, BSN, MHA/MSL, [place] Telephonic Care Manager Coordinator [facility] Direct Phone: [phone] Cell Phone: [phone] Toll Free: [phone] Fax: [phone]

## 2017-02-11 ENCOUNTER — Encounter: Payer: Self-pay | Admitting: Emergency Medicine

## 2017-02-11 ENCOUNTER — Other Ambulatory Visit: Payer: Self-pay

## 2017-02-11 ENCOUNTER — Ambulatory Visit: Payer: PPO | Admitting: Emergency Medicine

## 2017-02-11 VITALS — BP 122/80 | HR 89 | Wt 131.0 lb

## 2017-02-11 DIAGNOSIS — Z23 Encounter for immunization: Secondary | ICD-10-CM

## 2017-02-11 DIAGNOSIS — R053 Chronic cough: Secondary | ICD-10-CM

## 2017-02-11 DIAGNOSIS — R05 Cough: Secondary | ICD-10-CM

## 2017-02-11 DIAGNOSIS — J449 Chronic obstructive pulmonary disease, unspecified: Secondary | ICD-10-CM

## 2017-02-11 MED ORDER — FLUTICASONE PROPIONATE 50 MCG/ACT NA SUSP: 2.0000 | Freq: Every day | NASAL | 5 refills | Status: DC

## 2017-02-11 MED ORDER — PANTOPRAZOLE SODIUM 40 MG PO TBEC: 40.0000 mg | DELAYED_RELEASE_TABLET | Freq: Every day | ORAL | 5 refills | Status: DC

## 2017-02-11 MED ORDER — LORATADINE 10 MG PO TABS: 10.0000 mg | ORAL_TABLET | Freq: Every day | ORAL | 5 refills | Status: DC

## 2017-02-11 NOTE — Patient Outreach (Signed)
[facility] [place] [facility]) Care Management  02/11/2017  [place] 10-19-1934 [id number]   RNCM received referral per [place] provider. RNCM called to screen. No answer. HIPPA compliant message left.   RNCM noted client screened per telephonic Care coordinator, [place] Bells, who reports she will continue to follow client.  Plan: writer will  No longer be involved in case.  Thea Silversmith, RN, MSN, [place] Coordinator Cell: [phone]

## 2017-02-11 NOTE — Assessment & Plan Note (Signed)
She has been followed here for chronic cough in the past, influences have been identified as GERD and chronic rhinitis.  Keeps a daily cough but this is been worse over the last month.  She did not improve with antibiotics although her sputum did lighten in color.  I believe at least temporarily she will need to be restarted on therapy for GERD and chronic rhinitis.  She has Dexilant on her medicine list but does not use it reliably.  She also has prednisone on her medicine list, is unable to tell me why she is on this medication.  She does not take it every day only some days.  Stop it today.  She needs pulmonary function testing to assess for any evolving COPD.  She has albuterol available but is not on a schedule bronchodilator.   Please start pantoprazole 40 mg once a day until our next visit.  Remember to take this medication 30-60 minutes before or after eating. Please start loratadine 10 mg daily until our next visit Try using fluticasone nasal spray, 2 sprays each nostril once a day until our next visit. We may be able to stop some of these recommended medications once we get your cough under better control. We will perform full pulmonary function testing Continue to use albuterol 2 puffs or 1 nebulizer treatment up to every 6 hours if needed for shortness of breath, wheezing, chest tightness Depending on your breathing tests we may decide to start an every day inhaler. Stop using prednisone since you have not been taking it every day Continue to use Tessalon Perles as needed for cough suppression Try using Delsym as directed for cough suppression Follow with Dr Lamonte Sakai in 1 month or next available with PFT same day

## 2017-02-11 NOTE — Patient Instructions (Signed)
Please start pantoprazole 40 mg once a day until our next visit.  Remember to take this medication 30-60 minutes before or after eating. Please start loratadine 10 mg daily until our next visit Try using fluticasone nasal spray, 2 sprays each nostril once a day until our next visit. We may be able to stop some of these recommended medications once we get your cough under better control. We will perform full pulmonary function testing Continue to use albuterol 2 puffs or 1 nebulizer treatment up to every 6 hours if needed for shortness of breath, wheezing, chest tightness Depending on your breathing tests we may decide to start an every day inhaler. Stop using prednisone since you have not been taking it every day Continue to use Tessalon Perles as needed for cough suppression Try using Delsym as directed for cough suppression Follow with Dr Lamonte Sakai in 1 month or next available with PFT same day

## 2017-02-11 NOTE — Progress Notes (Signed)
Subjective:    Patient ID: [name], female    DOB: 1935-01-19, 82 y.o.   MRN: [id number]  HPI 82 year old former smoker (30 pack years), with a history of bilateral breast cancer treated with sgy, chemoradiation, DVT/PE, COPD and chronic cough that have been followed in the past by Dr Gwenette Greet.   She is here today with worsening of cough, about 1 month, but she has cough even at baseline. Was treated w abx x 2 for ? Bronchitis. Cough is prod of gray mucous. She hsa persistent clear nasal drainage. Using tessalon perles without much effect. She is on dexilant but uses it prn. She has pred 5mg  on her med list, but does not take reliably, doesn't know why she is on it. She is not on scheduled BD's. Uses albuterol approximately 2x a day. Does a lot of throat clearing.   CT scan of the chest from 04/01/15 was reviewed by me.  This shows bilateral interstitial prominence and some mild traction bronchiectasis.  No evidence of pulmonary mass.  Suspect that this is post radiation change or scarring in the aftermath of prior pneumonias  Spirometry performed 03/25/10 reviewed by me.  FVC 2.98 (91% predicted), FEV1 2.26 .(92% predicted), ratio 75%.  Curves unavailable.   Review of Systems  Constitutional: Negative for fever and unexpected weight change.  HENT: Negative for congestion, dental problem, ear pain, nosebleeds, postnasal drip, rhinorrhea, sinus pressure, sneezing, sore throat and trouble swallowing.   Eyes: Negative for redness and itching.  Respiratory: Positive for cough, chest tightness, shortness of breath and wheezing.   Cardiovascular: Negative for palpitations and leg swelling.  Gastrointestinal: Negative for nausea and vomiting.  Genitourinary: Negative for dysuria.  Musculoskeletal: Negative for joint swelling.  Skin: Negative for rash.  Neurological: Negative for headaches.  Hematological: Does not bruise/bleed easily.  Psychiatric/Behavioral: Negative for dysphoric mood.  The patient is not nervous/anxious.     Past Medical History:  Diagnosis Date  . Anemia   . Asthma   . Breast cancer ([place]) RIGHT SIDE-- DX OCT 2011   CHEMORADIATION THERAPY-- COMPLETED   . CHF (congestive heart failure) ([place])   . Ecchymosis   . Fibromyalgia   . Fibromyalgia   . Frequency of urination   . History of breast cancer LEFT BREAST DCIS  1996  . History of DVT (deep vein thrombosis)   . History of pulmonary embolism   . Hyperlipidemia   . IBS (irritable bowel syndrome)   . Lung mass PER DR CLANCE NOTE UNCLEAR IF LYMPHOMA FROM BX   FOLLOWED BY DR RUBIN  . 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 Nocturia   . Osteoporosis   . PONV (postoperative nausea and vomiting)   . Seasonal allergies   . Seizures ([place])   . Skin tear LEFT ARM COVERED W/ TEGADERM   PT STATES HX MULTIPLE SKIN TEARS -- THIN  . Thin skin SECONARDY AGE AND HX CHEMO  . Unspecified hereditary and idiopathic peripheral neuropathy   . Vertebral compression fracture (HCC) 5/14   L4     Family History  Problem Relation Age of Onset  . Emphysema Father   . CAD Father   . Ovarian cancer Mother   . Cancer Mother        cervical  . Alzheimer's disease Mother   . Hyperlipidemia Mother   . Breast cancer Mother   . Cancer Sister        cervical  .  Breast cancer Sister   . Breast cancer Maternal Grandmother   . Breast cancer Maternal Aunt      Social History   Socioeconomic History  . Marital status: Married    Spouse name: Not on file  . Number of children: Not on file  . Years of education: Not on file  . Highest education level: Not on file  Social Needs  . Financial resource strain: Not on file  . Food insecurity - worry: Not on file  . Food insecurity - inability: Not on file  . Transportation needs - medical: Not on file  . Transportation needs - non-medical: Not on file  Occupational History  . Occupation: retired  Tobacco Use  . Smoking  status: Former Smoker    Packs/day: 1.00    Years: 30.00    Pack years: 30.00    Types: Cigarettes    Last attempt to quit: 03/01/1978    Years since quitting: 38.9  . Smokeless tobacco: Never Used  Substance and Sexual Activity  . Alcohol use: Yes    Alcohol/week: 4.2 - 6.0 oz    Types: 7 - 10 Glasses of wine per week  . Drug use: No  . Sexual activity: No    Birth control/protection: Post-menopausal, Surgical  Other Topics Concern  . Not on file  Social History Narrative  . Not on file     Allergies  Allergen Reactions  . Penicillins Anaphylaxis  . Adhesive [Tape] Other (See Comments)    Blisters   . Doxycycline Nausea And Vomiting  . Morphine And Related Other (See Comments)    HALLUCINATIONS     Outpatient Medications Prior to Visit  Medication Sig Dispense Refill  . acetaminophen (TYLENOL) 500 MG tablet Take 2 tablets (1,000 mg total) by mouth every 6 (six) hours as needed for mild pain or moderate pain. 60 tablet 0  . albuterol (ACCUNEB) 1.25 MG/3ML nebulizer solution Take 1 ampule by nebulization every 6 (six) hours as needed for wheezing.    Marland Kitchen aspirin EC 81 MG tablet Take 81 mg by mouth daily.    . benzonatate (TESSALON) 100 MG capsule Take 100 mg by mouth 3 (three) times daily as needed for cough.    . DULoxetine (CYMBALTA) 60 MG capsule Take 60 mg by mouth daily after lunch.     . ergocalciferol (VITAMIN D2) 50000 UNITS capsule Take 50,000 Units by mouth once a week. Every Tuesday.    . ezetimibe-simvastatin (VYTORIN) 10-80 MG per tablet Take 1 tablet by mouth at bedtime. Reported on 03/26/2015    . gabapentin (NEURONTIN) 100 MG capsule Take 100 mg by mouth 3 (three) times daily.     Marland Kitchen levothyroxine (SYNTHROID, LEVOTHROID) 88 MCG tablet Take 88 mcg by mouth daily before breakfast. For hypothyroid    . LORazepam (ATIVAN) 0.5 MG tablet Take 0.5 mg by mouth every 8 (eight) hours as needed for anxiety. Anxiety    . dexlansoprazole (DEXILANT) 60 MG capsule Take 60 mg by  mouth daily.     . predniSONE (DELTASONE) 5 MG tablet Take 5 mg by mouth daily. Reported on 03/26/2015     Facility-Administered Medications Prior to Visit  Medication Dose Route Frequency Provider Last Rate Last Dose  . fentaNYL (SUBLIMAZE) injection 25-50 mcg  25-50 mcg Intravenous Q5 min PRN Rod Mae, MD            Objective:   Physical Exam Vitals:   02/11/17 1041  BP: 122/80  Pulse: 89  SpO2:  95%  Weight: 131 lb (59.4 kg)   Gen: Pleasant, elderly woman, well-nourished, in no distress,  normal affect  ENT: No lesions,  mouth clear,  oropharynx clear, no postnasal drip  Neck: No JVD, no stridor  Lungs: No use of accessory muscles, coughing with deep breaths, no wheeze, few scattered bibasilar crackles  Cardiovascular: RRR, heart sounds normal, no murmur or gallops, trace pretibial peripheral edema  Musculoskeletal: No deformities, no cyanosis or clubbing  Neuro: alert, non focal  Skin: Warm, no lesions or rash     Assessment & Plan:  Chronic cough She has been followed here for chronic cough in the past, influences have been identified as GERD and chronic rhinitis.  Keeps a daily cough but this is been worse over the last month.  She did not improve with antibiotics although her sputum did lighten in color.  I believe at least temporarily she will need to be restarted on therapy for GERD and chronic rhinitis.  She has Dexilant on her medicine list but does not use it reliably.  She also has prednisone on her medicine list, is unable to tell me why she is on this medication.  She does not take it every day only some days.  Stop it today.  She needs pulmonary function testing to assess for any evolving COPD.  She has albuterol available but is not on a schedule bronchodilator.   Please start pantoprazole 40 mg once a day until our next visit.  Remember to take this medication 30-60 minutes before or after eating. Please start loratadine 10 mg daily until our next  visit Try using fluticasone nasal spray, 2 sprays each nostril once a day until our next visit. We may be able to stop some of these recommended medications once we get your cough under better control. We will perform full pulmonary function testing Continue to use albuterol 2 puffs or 1 nebulizer treatment up to every 6 hours if needed for shortness of breath, wheezing, chest tightness Depending on your breathing tests we may decide to start an every day inhaler. Stop using prednisone since you have not been taking it every day Continue to use Tessalon Perles as needed for cough suppression Try using Delsym as directed for cough suppression Follow with Dr Lamonte Sakai in 1 month or next available with PFT same day  Baltazar Apo, MD, PhD 02/11/2017, 11:14 AM [facility] [phone] or if no answer [phone]

## 2017-02-15 ENCOUNTER — Encounter: Payer: Self-pay | Admitting: *Deleted

## 2017-02-16 ENCOUNTER — Encounter: Payer: Self-pay | Admitting: Cardiovascular Disease

## 2017-02-16 ENCOUNTER — Ambulatory Visit: Payer: PPO | Admitting: Cardiovascular Disease

## 2017-02-16 ENCOUNTER — Other Ambulatory Visit: Payer: Self-pay | Admitting: *Deleted

## 2017-02-16 ENCOUNTER — Ambulatory Visit: Payer: Self-pay | Admitting: *Deleted

## 2017-02-16 DIAGNOSIS — E7800 Pure hypercholesterolemia, unspecified: Secondary | ICD-10-CM

## 2017-02-16 DIAGNOSIS — R002 Palpitations: Secondary | ICD-10-CM | POA: Diagnosis not present

## 2017-02-16 NOTE — Patient Outreach (Signed)
[facility]) Care Management  02/09/17   [place] Apr 23, 1934 [id number]   Telephone Screen  Referral Date:02/09/17 Referral Source: [employer] (HTA) Referral Reason: Uncontrolled co-morbidities, COPD, Pain Member has uncontrolled COPD, chronic pain, arthritis, neuropathy, PVD, IBS, Osteoporsis, Incontinence. Insurance: HTA  Outreach attempt # 1 to patient and HIPAA identifiers verified. Patient confirmed having a visit with a Nurse Practitioner (NP). She has been waiting on the results of the assessment. Patient was thankful for the visit from the NP. She voiced having swelling in her lower extremities. She wears support hoses to assist with decreasing the fluid build-up, per patient. Patient stated, she's had respiratory complications with a chronic cough. She is unable to purchase Symbicort and has been receiving samples from her primary MD. Patient was referred to a Pulmonologist and her appointment is scheduled for 02/11/17. Patient discussed having palpitations and elevation in her blood pressure. She doesn't have a Cardiologist and she believes that she needs to be referred to a Cardiologist. Patient explained having a history of cancer and receiving chemotherapy. She believes the chemotherapy negatively affected her holistically. Patient reported being prescribed Prednisone, long term. Patient stated, she doesn't take the Prednisone and other medications as prescribed. She stated, "I don't like taking to many medications". RN CM spoke with patient about the importance of taking medications as prescribed, especially Prednisone due to the rebound effects. Patient's last appointment with primary MD was in November 2018 and her next appointment is 04/13/16. [facility] services and benefits explained to patient. Patient agreed to services.   Plan: RNCM will notify [facility] Case Management Assistant of case status. RN CM notified [facility] Case Management Assistant: patient agreed to  services and case opened. RN CM advised patient to contact RNCM for any needs or concerns. RN CM advised patient to alert MD for any changes in conditions.  RN CM provided patient with [facility] 24hr Nurse Line contact info. RM CM will send successful outreach letter to patient with introductory [facility] welcome package. RN will send EMMI educational materials to patient.   Lake Bells, RN, BSN, MHA/MSL, [place] Telephonic Care Manager Coordinator [facility] Direct Phone: [phone] Cell Phone: [phone] Toll Free: [phone] Fax: [phone]

## 2017-02-16 NOTE — Patient Instructions (Signed)
Medication Instructions: Your physician recommends that you continue on your current medications as directed. Please refer to the Current Medication list given to you today.   Follow-Up: Your physician recommends that you schedule a follow-up appointment as needed with Dr. Berry.    

## 2017-02-16 NOTE — Progress Notes (Signed)
02/16/2017 [place]   Jul 25, 1934  [id number]  Primary Physician Reynold Bowen, MD Primary Cardiologist: Lorretta Harp MD FACP, [place], [place], [place]  HPI:  [name] is a 82 y.o. is widowed Caucasian female mother of 2, grandmother and 5 grandchildren who is referred for evaluation of hypertension on an isolated blood pressure reading. She has a history of remote tobacco abuse having quit close to 30 years ago. 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 Dr. Carrolyn Meiers is her primary care provider. She has seen Dr. [place] Carbo, pulmonology for COPD. She also has been evaluated by Dr. Lovena Le in the past for palpitations and event monitor apparently showed PACs and PVCs. She gets rare chest pain.   Current Meds  Medication Sig  . acetaminophen (TYLENOL) 500 MG tablet Take 2 tablets (1,000 mg total) by mouth every 6 (six) hours as needed for mild pain or moderate pain.  Marland Kitchen albuterol (ACCUNEB) 1.25 MG/3ML nebulizer solution Take 1 ampule by nebulization every 6 (six) hours as needed for wheezing.  Marland Kitchen aspirin EC 81 MG tablet Take 81 mg by mouth daily.  . benzonatate (TESSALON) 100 MG capsule Take 100 mg by mouth 3 (three) times daily as needed for cough.  . DULoxetine (CYMBALTA) 60 MG capsule Take 60 mg by mouth daily after lunch.   . ergocalciferol (VITAMIN D2) 50000 UNITS capsule Take 50,000 Units by mouth once a week. Every Tuesday.  . ezetimibe-simvastatin (VYTORIN) 10-80 MG per tablet Take 1 tablet by mouth at bedtime. Reported on 03/26/2015  . fluticasone (FLONASE) 50 MCG/ACT nasal spray Place 2 sprays into both nostrils daily.  Marland Kitchen gabapentin (NEURONTIN) 100 MG capsule Take 100 mg by mouth 3 (three) times daily.   Marland Kitchen levothyroxine (SYNTHROID, LEVOTHROID) 88 MCG tablet Take 88 mcg by mouth daily before breakfast. For hypothyroid  . loratadine (CLARITIN) 10 MG tablet Take 1 tablet (10 mg total) by mouth daily.  Marland Kitchen LORazepam (ATIVAN) 0.5 MG tablet  Take 0.5 mg by mouth every 8 (eight) hours as needed for anxiety. Anxiety  . pantoprazole (PROTONIX) 40 MG tablet Take 1 tablet (40 mg total) by mouth daily.     Allergies  Allergen Reactions  . Penicillins Anaphylaxis  . Adhesive [Tape] Other (See Comments)    Blisters   . Doxycycline Nausea And Vomiting  . Morphine And Related Other (See Comments)    HALLUCINATIONS    Social History   Socioeconomic History  . Marital status: Married    Spouse name: Not on file  . Number of children: Not on file  . Years of education: Not on file  . Highest education level: Not on file  Social Needs  . Financial resource strain: Not on file  . Food insecurity - worry: Not on file  . Food insecurity - inability: Not on file  . Transportation needs - medical: Not on file  . Transportation needs - non-medical: Not on file  Occupational History  . Occupation: retired  Tobacco Use  . Smoking status: Former Smoker    Packs/day: 1.00    Years: 30.00    Pack years: 30.00    Types: Cigarettes    Last attempt to quit: 03/01/1978    Years since quitting: 38.9  . Smokeless tobacco: Never Used  Substance and Sexual Activity  . Alcohol use: Yes    Alcohol/week: 4.2 - 6.0 oz    Types: 7 - 10 Glasses of wine per week  .  Drug use: No  . Sexual activity: No    Birth control/protection: Post-menopausal, Surgical  Other Topics Concern  . Not on file  Social History Narrative  . Not on file     Review of Systems: General: negative for chills, fever, night sweats or weight changes.  Cardiovascular: negative for chest pain, dyspnea on exertion, edema, orthopnea, palpitations, paroxysmal nocturnal dyspnea or shortness of breath Dermatological: negative for rash Respiratory: negative for cough or wheezing Urologic: negative for hematuria Abdominal: negative for nausea, vomiting, diarrhea, bright red blood per rectum, melena, or hematemesis Neurologic: negative for visual changes, syncope, or  dizziness All other systems reviewed and are otherwise negative except as noted above.    Blood pressure 116/78, pulse 84, height 5\' 4"  (1.626 m), weight 127 lb 12.8 oz (58 kg).  General appearance: alert and no distress Neck: no adenopathy, no carotid bruit, no JVD, supple, symmetrical, trachea midline and thyroid not enlarged, symmetric, no tenderness/mass/nodules Lungs: clear to auscultation bilaterally Heart: regular rate and rhythm, S1, S2 normal, no murmur, click, rub or gallop Extremities: extremities normal, atraumatic, no cyanosis or edema Pulses: 2+ and symmetric Skin: Skin color, texture, turgor normal. No rashes or lesions Neurologic: Alert and oriented X 3, normal strength and tone. Normal symmetric reflexes. Normal coordination and gait  EKG sinus rhythm at 84 with right bundle branch block and inferior Q waves. I personally reviewed this EKG.  ASSESSMENT AND PLAN:   HLD (hyperlipidemia) History of hyperlipidemia on statin therapy followed by her PCP  Heart palpitations History of palpitations the past with event monitor done by Dr. Lovena Le July 2015 that showed he PACs and PVCs. I do not think he's required further evaluation at this time.      Lorretta Harp MD FACP,FACC,FAHA, [facility] 02/16/2017 11:37 AM

## 2017-02-16 NOTE — Assessment & Plan Note (Signed)
History of palpitations the past with event monitor done by Dr. Lovena Le July 2015 that showed he PACs and PVCs. I do not think he's required further evaluation at this time.

## 2017-02-16 NOTE — Telephone Encounter (Signed)
This encounter was created in error - please disregard.

## 2017-02-16 NOTE — Assessment & Plan Note (Signed)
History of hyperlipidemia on statin therapy followed by her PCP. 

## 2017-02-17 ENCOUNTER — Encounter: Payer: Self-pay | Admitting: *Deleted

## 2017-02-17 NOTE — Patient Outreach (Signed)
[facility]) Care Management  02/16/17  [place] 04-Jun-1934 [id number]   Telephone Assessment  Outreach telephone call to patient. HIPAA identifiers verified with patient. Patient rescheduled her Cardiology appointment for today, 02/16/17. She stated, everything went well at the MD's office, including her blood pressure. She thinks the blood pressure reading by the Nurse Practitioner was incorrect. Patient reported, she is not in Atrial Fibrillation. Per patient, her palpitations are chronic and tolerable. She doesn't have to reschedule an appointment with the Cardiologist. Per EMR, she can make visits on a per diem basis. Patient spoke about her history of having a chronic cough, COPD, and emphysema. She has an upcoming appointment on 03/25/17 with a Pulmonologist. Patient described having swelling in her lower extremities, left greater than right. She believes chemotherapy affected multiple body systems. Patient verbalized wearing stockings to control the swelling.    Plan: RN CM advised patient to contact RN CM for any needs or concerns. RN CM advised patient to alert MD for any changes in conditions.  RN CM will send primary MD barriers letter.  Lake Bells, RN, BSN, MHA/MSL, [place] Telephonic Care Manager Coordinator [facility] Direct Phone: [phone] Cell Phone: [phone] Toll Free: [phone] Fax: [phone]

## 2017-02-17 NOTE — Telephone Encounter (Signed)
This encounter was created in error - please disregard.

## 2017-02-18 ENCOUNTER — Other Ambulatory Visit: Payer: Self-pay | Admitting: Pharmacist

## 2017-02-18 NOTE — Patient Outreach (Signed)
[facility]) Care Management  02/18/2017  [place] 10-09-1934 [id number]   Patient was called per referral regarding medication assistance for Symbicort. Unfortunately, patient did not answer the phone on either number listed on her chart. HIPAA compliant message left on both voicemails.  Although the referral came through about Symbicort, Symbicort is not on the patient's medication list.  Plan: Call patient back in 5-7 business days.  Elayne Guerin, PharmD, [place] Clinical Pharmacist [phone]

## 2017-02-24 ENCOUNTER — Encounter: Payer: Self-pay | Admitting: Pharmacist

## 2017-02-24 ENCOUNTER — Other Ambulatory Visit: Payer: Self-pay | Admitting: *Deleted

## 2017-02-24 ENCOUNTER — Other Ambulatory Visit: Payer: Self-pay | Admitting: Pharmacist

## 2017-02-24 NOTE — Patient Outreach (Addendum)
[facility] [facility]) Care Management  [place]  02/24/2017   [place] 1934/07/08 [id number]  S/O:  Patient lives with her son. She is independent/assist with ADL's. She is dependent upon transportation to medical appointments. Patient had a visit from a Nurse Practitioner (NP) with [employer]. Patient was thankful for the visit from the NP. She voiced having swelling in her lower extremities. She wears support hoses to assist with decreasing the fluid build-up, per patient. Patient stated, she's had respiratory complications with a chronic cough. She is unable to purchase Symbicort and has been receiving samples from her primary MD. Patient was referred to a Pulmonologist and her appointment is scheduled for 02/11/17. Patient discussed having palpitations and elevation in her blood pressure. She spoke about being referred to a Cardiologist. Patient explained having a history of cancer and receiving chemotherapy. She believes the chemotherapy negatively affected her holistically. Patient reported being prescribed Prednisone, long term. Patient stated, she doesn't take the Prednisone and other medications as prescribed. She stated, "I don't like taking to many medications". RN CM spoke with patient about the importance of taking medications as prescribed, especially Prednisone due to the rebound effects. Patient's last appointment with primary MD was in November 2018 and her next appointment is 04/13/16. RN CM scheduled an appointment with a Cardiologist. Patient rescheduled her Cardiology appointment for today, 02/16/17. She stated, everything went well at the MD's office, including her blood pressure. She thinks the blood pressure reading by the Nurse Practitioner was incorrect. Patient reported, she is not in Atrial Fibrillation. Per patient, her palpitations are chronic and tolerable. She doesn't have to reschedule an appointment with the Cardiologist. Per EMR, she can make  visits on a per diem basis. Patient spoke about her history of having a chronic cough, COPD, and emphysema. She changed her Pulmonology appointment from 02/11/17 to 03/25/17.   Encounter Medications:  Outpatient Encounter Medications as of 02/24/2017  Medication Sig Note  . acetaminophen (TYLENOL) 500 MG tablet Take 2 tablets (1,000 mg total) by mouth every 6 (six) hours as needed for mild pain or moderate pain.   Marland Kitchen albuterol (ACCUNEB) 1.25 MG/3ML nebulizer solution Take 1 ampule by nebulization every 6 (six) hours as needed for wheezing.   Marland Kitchen aspirin EC 81 MG tablet Take 81 mg by mouth daily.   . benzonatate (TESSALON) 100 MG capsule Take 100 mg by mouth 3 (three) times daily as needed for cough.   . budesonide-formoterol (SYMBICORT) 160-4.5 MCG/ACT inhaler Inhale 2 puffs into the lungs 2 (two) times daily. (PATIENT WAS GIVEN SAMPLES)--ALTERNATES WITH ADVAIR SAMPLES 02/24/2017: Alternates with Advair Samples  . DULoxetine (CYMBALTA) 60 MG capsule Take 60 mg by mouth daily after lunch.    . ergocalciferol (VITAMIN D2) 50000 UNITS capsule Take 50,000 Units by mouth once a week. Every Tuesday.   . ezetimibe-simvastatin (VYTORIN) 10-80 MG per tablet Take 1 tablet by mouth at bedtime. Reported on 03/26/2015   . fluticasone (FLONASE) 50 MCG/ACT nasal spray Place 2 sprays into both nostrils daily.   . Fluticasone-Salmeterol (ADVAIR DISKUS) 250-50 MCG/DOSE AEPB Inhale 1 puff into the lungs 2 (two) times daily. (PATIENT WAS GIVEN SAMPLES-ALTERNATES WITH SYMBICORT SAMPLES) 02/24/2017: Alternates with Symbicort samples  . gabapentin (NEURONTIN) 100 MG capsule Take 100 mg by mouth 3 (three) times daily.    Marland Kitchen levothyroxine (SYNTHROID, LEVOTHROID) 88 MCG tablet Take 88 mcg by mouth daily before breakfast. For hypothyroid   . loratadine (CLARITIN) 10 MG tablet Take 1 tablet (10 mg total)  by mouth daily.   Marland Kitchen LORazepam (ATIVAN) 0.5 MG tablet Take 0.5 mg by mouth every 8 (eight) hours as needed for anxiety. Anxiety    . pantoprazole (PROTONIX) 40 MG tablet Take 1 tablet (40 mg total) by mouth daily.    Facility-Administered Encounter Medications as of 02/24/2017  Medication  . fentaNYL (SUBLIMAZE) injection 25-50 mcg    Functional Status:  In your present state of health, do you have any difficulty performing the following activities: 02/16/2017 07/12/2016  Hearing? N N  Vision? N N  Difficulty concentrating or making decisions? N N  Walking or climbing stairs? Y Y  Dressing or bathing? Y N  Doing errands, shopping? Y -  Conservation officer, nature and eating ? Y -  Using the Toilet? N -  In the past six months, have you accidently leaked urine? Y -  Do you have problems with loss of bowel control? N -  Managing your Medications? N -  Managing your Finances? N -  Housekeeping or managing your Housekeeping? Y -  Some recent data might be hidden    Fall/Depression Screening: Fall Risk  02/16/2017 02/15/2017  Falls in the past year? - No  Risk for fall due to : Impaired balance/gait;Impaired mobility -   PHQ 2/9 Scores 02/09/2017  PHQ - 2 Score 0    Assessment:  THN CM Care Plan Problem One     Most Recent Value  Care Plan Problem One  Patient needs further evaluation and work-up for elevated blood pressure and medication adherence.  (Pended)   Role Documenting the Problem One  Care Management Telephonic Coordinator  (Pended)   Care Plan for Problem One  Active  (Pended)   THN CM Short Term Goal #1   RN CM will establish and make MD appointment within the next 30 days.  (Pended)   THN CM Short Term Goal #1 Start Date  02/09/17  (Pended)   Interventions for Short Term Goal #1  Discuss with patient upcoming MD appointment.  (Pended)   THN CM Short Term Goal #2   RN CM will send [facility] pharmacist referral to address med issues and patient will engage with pharrmacist within the next 30 days.  (Pended)   THN CM Short Term Goal #2 Start Date  02/09/17  (Pended)   Interventions for Short Term Goal #2  Educate  about disease process and treatment regimen.   (Pended)       Plan:  RN CMwill contact patient within a month.  RN CM will send barriers letters and route encounter to PCP. RN CM advised patient to contact RN CM for any needs or concerns.   Lake Bells, RN, BSN, MHA/MSL, [place] Telephonic Care Manager Coordinator [facility] Direct Phone: [phone] Cell Phone: [phone] Toll Free: [phone] Fax: [phone]

## 2017-02-25 NOTE — Patient Outreach (Addendum)
[place] [facility]) Care Management  [place]   02/25/2017  [place] 30-Mar-1934 [id number]  Subjective: Patient was called regarding medication assistance.  HIPAA identifiers were obtained.Patient is an 82 year old female with multiple medical conditions including but not limited to:  History of breast cancer, DVT, depression, GI bleed, Chronic Venous insufficiency, depression, hyperlipidemia, hypothyroidism, osteoporosis, seizures, and vertebral compression fractures.  Patient said she could not afford Advair and Symbicort. Both medications had been given to her by her PCP and were not on her active medication list.   Objective:   Encounter Medications: Outpatient Encounter Medications as of 02/24/2017  Medication Sig Note  . acetaminophen (TYLENOL) 500 MG tablet Take 2 tablets (1,000 mg total) by mouth every 6 (six) hours as needed for mild pain or moderate pain.   Marland Kitchen albuterol (ACCUNEB) 1.25 MG/3ML nebulizer solution Take 1 ampule by nebulization every 6 (six) hours as needed for wheezing.   Marland Kitchen aspirin EC 81 MG tablet Take 81 mg by mouth daily.   . benzonatate (TESSALON) 100 MG capsule Take 100 mg by mouth 3 (three) times daily as needed for cough.   . budesonide-formoterol (SYMBICORT) 160-4.5 MCG/ACT inhaler Inhale 2 puffs into the lungs 2 (two) times daily. (PATIENT WAS GIVEN SAMPLES)--ALTERNATES WITH ADVAIR SAMPLES 02/24/2017: Alternates with Advair Samples  . DULoxetine (CYMBALTA) 60 MG capsule Take 60 mg by mouth daily after lunch.    . ergocalciferol (VITAMIN D2) 50000 UNITS capsule Take 50,000 Units by mouth once a week. Every Tuesday.   . ezetimibe-simvastatin (VYTORIN) 10-80 MG per tablet Take 1 tablet by mouth at bedtime. Reported on 03/26/2015   . fluticasone (FLONASE) 50 MCG/ACT nasal spray Place 2 sprays into both nostrils daily.   . Fluticasone-Salmeterol (ADVAIR DISKUS) 250-50 MCG/DOSE AEPB Inhale 1 puff into the lungs 2 (two) times daily.  (PATIENT WAS GIVEN SAMPLES-ALTERNATES WITH SYMBICORT SAMPLES) 02/24/2017: Alternates with Symbicort samples  . gabapentin (NEURONTIN) 100 MG capsule Take 100 mg by mouth 3 (three) times daily.    Marland Kitchen levothyroxine (SYNTHROID, LEVOTHROID) 88 MCG tablet Take 88 mcg by mouth daily before breakfast. For hypothyroid   . loratadine (CLARITIN) 10 MG tablet Take 1 tablet (10 mg total) by mouth daily.   Marland Kitchen LORazepam (ATIVAN) 0.5 MG tablet Take 0.5 mg by mouth every 8 (eight) hours as needed for anxiety. Anxiety   . pantoprazole (PROTONIX) 40 MG tablet Take 1 tablet (40 mg total) by mouth daily.    Facility-Administered Encounter Medications as of 02/24/2017  Medication  . fentaNYL (SUBLIMAZE) injection 25-50 mcg    Functional Status: In your present state of health, do you have any difficulty performing the following activities: 02/16/2017 07/12/2016  Hearing? N N  Vision? N N  Difficulty concentrating or making decisions? N N  Walking or climbing stairs? Y Y  Dressing or bathing? Y N  Doing errands, shopping? Y -  Conservation officer, nature and eating ? Y -  Using the Toilet? N -  In the past six months, have you accidently leaked urine? Y -  Do you have problems with loss of bowel control? N -  Managing your Medications? N -  Managing your Finances? N -  Housekeeping or managing your Housekeeping? Y -  Some recent data might be hidden    Fall/Depression Screening: Fall Risk  02/16/2017 02/15/2017  Falls in the past year? - No  Risk for fall due to : Impaired balance/gait;Impaired mobility -   PHQ 2/9 Scores 02/09/2017  PHQ -  2 Score 0      Assessment: Patient's medications were reviewed via telephone.     Drugs sorted by system:  Neurologic/Psychologic: Duloxetine Gabapentin Lorazepam  Cardiovascular: Aspirin Ezetimibe-simvastatin  Pulmonary/Allergy: Albuterol nebulizer  solution Benzonatate Symbicort/Advair Fluticasone  Gastrointestinal: Pantoprazole  Endocrine: Synthroid  Pain: Acetaminophen  Vitamins/Minerals: Ergocalciferol  Medication Review Findings:  Patient said she alternates between Advair and Symbicort depending on what samples she has.  Neither Advair or Symbicort were on her medication list or mentioned during her recent Pulmonology visit.  Both were added to her medication list with notation about alternating between the two.    Adverse Side Effect- Albuterol-patient reported she does not use due to it causing heart palpitations. If deemed therapeutically appropriate, Xopenex could be an option.  Adherence:   Patient reported using Advair and Symbicort PRN-she was educated on how these medications work and that they are controller therapies.  Medication Assistance Findings:  -patient is over income for the  "Extra Help" program offered by Social Security -patient appears to meet the income requirements for [employer] ([place] Patient Assistance Program (Advair) and for [employer] (Symbicort) -both [employer] and [place] programs require patients to spend $600 in medication expenses to qualify for their program -if she were switched to [facility], patient could get Dulera from [employer] patient assistance program. -patient understands that since inhalers are not on her medication list as being prescribed, her provider will need to prescribe her inhalers as deemed therapeutically appropriate and sign the necessary forms. -patient has a pulmonary visit 03/25/17 -patient understand she will need to provide financial documentation as proof of her income.  Plan: Route note to patient's PCP Route note to patient's Pulmonologist Follow up with patient after her Pulmonary Visit  Elayne Guerin, PharmD, [place] Clinical Pharmacist [phone]

## 2017-03-02 ENCOUNTER — Ambulatory Visit: Payer: PPO | Admitting: Cardiovascular Disease

## 2017-03-03 ENCOUNTER — Encounter: Payer: Self-pay | Admitting: *Deleted

## 2017-03-07 ENCOUNTER — Encounter: Payer: Self-pay | Admitting: *Deleted

## 2017-03-07 ENCOUNTER — Other Ambulatory Visit: Payer: Self-pay | Admitting: *Deleted

## 2017-03-07 NOTE — Patient Outreach (Addendum)
[facility]) Care Management  03/07/2017  [place] 22-May-1934 [id number]  Telephone Assessment  Telephone outreach to patient. HIPAA identifiers verified x's 2. Patient reported, she was ambulating from the bathroom to her living area with symptoms of shortness of breath (SOB). She stated, this is her baseline and she had to take breaks in between talking and ambulating. Patient stated, she had a visit with Dr. Lorie Phenix on 03/02/17. She was prescribed medications for SOB and coughing. Patient couldn't remember the name of the medications. She stated, the medicines were in the other room and she didn't want to get them. Patient doesn't believe the medication for the SOB is effective. She stopped taking the medications for her coughing. She wasn't sure if the medicine made her ill/sick. Patient has another Pulmonologist visit scheduled on 03/25/17. She will a have PFT study completed during the visit. Patient stated, overall she is at baseline and she would love to get her SOB and coughing under control. RN CM informed patient, she will be closing her case at this time and Alwyn Ren   [facility] Pharmacist) will be contacting her.    Plan: RN CM advised patient to contact RNCM for any needs or concerns. RN CM advised patient to alert MD for any changes in conditions.  RN CM will notify [facility] Case Management Assistant of case status.   Lake Bells, RN, BSN, MHA/MSL, [place] Telephonic Care Manager Coordinator [facility] Direct Phone: [phone] Cell Phone: [phone] Toll Free: [phone] Fax: [phone]

## 2017-03-07 NOTE — Telephone Encounter (Signed)
This encounter was created in error - please disregard.

## 2017-03-25 ENCOUNTER — Ambulatory Visit: Payer: PPO | Admitting: Emergency Medicine

## 2017-03-25 ENCOUNTER — Ambulatory Visit (INDEPENDENT_AMBULATORY_CARE_PROVIDER_SITE_OTHER): Payer: PPO | Admitting: Emergency Medicine

## 2017-03-25 ENCOUNTER — Encounter: Payer: Self-pay | Admitting: Emergency Medicine

## 2017-03-25 DIAGNOSIS — R053 Chronic cough: Secondary | ICD-10-CM

## 2017-03-25 DIAGNOSIS — R05 Cough: Secondary | ICD-10-CM | POA: Diagnosis not present

## 2017-03-25 DIAGNOSIS — J449 Chronic obstructive pulmonary disease, unspecified: Secondary | ICD-10-CM | POA: Diagnosis not present

## 2017-03-25 LAB — PULMONARY FUNCTION TEST
DL/VA % pred: 71 %
DL/VA: 3.33 ml/min/mmHg/L
DLCO cor % pred: 46 %
DLCO cor: 10.65 ml/min/mmHg
DLCO unc % pred: 42 %
DLCO unc: 9.82 ml/min/mmHg
FEF 25-75 Post: 2.26 L/sec
FEF 25-75 Pre: 1.39 L/sec
FEF2575-%Change-Post: 62 %
FEF2575-%Pred-Post: 173 %
FEF2575-%Pred-Pre: 107 %
FEV1-%Change-Post: 10 %
FEV1-%Pred-Post: 98 %
FEV1-%Pred-Pre: 89 %
FEV1-Post: 1.79 L
FEV1-Pre: 1.62 L
FEV1FVC-%Change-Post: 8 %
FEV1FVC-%Pred-Pre: 105 %
FEV6-%Change-Post: 1 %
FEV6-%Pred-Post: 91 %
FEV6-%Pred-Pre: 90 %
FEV6-Post: 2.12 L
FEV6-Pre: 2.09 L
FEV6FVC-%Pred-Post: 106 %
FEV6FVC-%Pred-Pre: 106 %
FVC-%Change-Post: 1 %
FVC-%Pred-Post: 86 %
FVC-%Pred-Pre: 85 %
FVC-Post: 2.12 L
FVC-Pre: 2.09 L
Post FEV1/FVC ratio: 84 %
Post FEV6/FVC ratio: 100 %
Pre FEV1/FVC ratio: 78 %
Pre FEV6/FVC Ratio: 100 %
RV % pred: 42 %
RV: 0.99 L
TLC % pred: 64 %
TLC: 3.16 L

## 2017-03-25 MED ORDER — ALBUTEROL SULFATE HFA 108 (90 BASE) MCG/ACT IN AERS: 2.0000 | INHALATION_SPRAY | Freq: Four times a day (QID) | RESPIRATORY_TRACT | 6 refills | Status: AC | PRN

## 2017-03-25 NOTE — Assessment & Plan Note (Signed)
She continued to have cough, it may be some better with the improvement in her GERD regimen.  She also believes her allergic rhinitis is improved.  Continue her current allergy regimen.  I will temporarily increase her pantoprazole to twice a day to see if she gets more benefit.  We talked about throat clearing.  She does have some bronchiectasis on CT scan, interstitial disease and she likely needs.  We will plan this next time.  Also note that if her symptoms persist she may require bronchoscopy for airway inspection, culture data.  No evidence for overt obstruction on her pulmonary function testing today.

## 2017-03-25 NOTE — Addendum Note (Signed)
Addended by: Dolores Lory on: 03/25/2017 02:27 PM   Modules accepted: Orders

## 2017-03-25 NOTE — Patient Instructions (Addendum)
Please stop Advair and Symbicort inhalers We will give you an albuterol inhaler.  Please use 1 puff of this if needed for coughing, shortness of breath, chest tightness Increase your pantoprazole to 40mg  twice a day for the next 2 weeks, then decrease back to once a day Continue your loratadine and fluticasone nasal spray as you have been taking them You need to try to stop clearing her throat Try using a sugar-free hard candy.  Avoid mentholated cough drops. We discussed possible bronchoscopy today.  Depending on your progress with your cough we may decide to revisit a possible procedure to inspect your airways and your throat. Follow with Dr Lamonte Sakai in 2 months or sooner if you have any problems.

## 2017-03-25 NOTE — Progress Notes (Signed)
Subjective:    Patient ID: [name], female    DOB: June 26, 1934, 83 y.o.   MRN: [id number]  HPI 83 year old former smoker (30 pack years), with a history of bilateral breast cancer treated with sgy, chemoradiation, DVT/PE, COPD and chronic cough that have been followed in the past by Dr Gwenette Greet.   She is here today with worsening of cough, about 1 month, but she has cough even at baseline. Was treated w abx x 2 for ? Bronchitis. Cough is prod of gray mucous. She hsa persistent clear nasal drainage. Using tessalon perles without much effect. She is on dexilant but uses it prn. She has pred 5mg  on her med list, but does not take reliably, doesn't know why she is on it. She is not on scheduled BD's. Uses albuterol approximately 2x a day. Does a lot of throat clearing.   CT scan of the chest from 04/01/15 was reviewed by me.  This shows bilateral interstitial prominence and some mild traction bronchiectasis.  No evidence of pulmonary mass.  Suspect that this is post radiation change or scarring in the aftermath of prior pneumonias  Spirometry performed 03/25/10 reviewed by me.  FVC 2.98 (91% predicted), FEV1 2.26 .(92% predicted), ratio 75%.  Curves unavailable.  ROV 03/25/17 --this is a follow-up visit for chronic cough, some bilateral interstitial prominence and mild traction bronchiectasis on prior CT scan of the abdomen.  Probable GERD and chronic rhinitis.  At her last visit we restarted pantoprazole, loratadine, fluticasone nasal spray.  She had been on prednisone that she was using as needed, we stopped this. She believes that her nasal congestion is much better. She still has some breakthrough GERD, but better. She uses Advair and Symbicort, uses them prn??  Nebs but does not like them because they make her agitated.  Broached subject of possible bronchoscopy w her today.   Review of Systems  Constitutional: Negative for fever and unexpected weight change.  HENT: Negative for congestion,  dental problem, ear pain, nosebleeds, postnasal drip, rhinorrhea, sinus pressure, sneezing, sore throat and trouble swallowing.   Eyes: Negative for redness and itching.  Respiratory: Positive for cough, chest tightness, shortness of breath and wheezing.   Cardiovascular: Negative for palpitations and leg swelling.  Gastrointestinal: Negative for nausea and vomiting.  Genitourinary: Negative for dysuria.  Musculoskeletal: Negative for joint swelling.  Skin: Negative for rash.  Neurological: Negative for headaches.  Hematological: Does not bruise/bleed easily.  Psychiatric/Behavioral: Negative for dysphoric mood. The patient is not nervous/anxious.     Past Medical History:  Diagnosis Date  . Anemia   . Asthma   . Breast cancer ([place]) RIGHT SIDE-- DX OCT 2011   CHEMORADIATION THERAPY-- COMPLETED   . CHF (congestive heart failure) ([place])   . COPD (chronic obstructive pulmonary disease) ([place])   . Diabetes mellitus without complication ([place])   . Ecchymosis   . Fibromyalgia   . Fibromyalgia   . Frequency of urination   . History of breast cancer LEFT BREAST DCIS  1996  . History of DVT (deep vein thrombosis)   . History of pulmonary embolism   . Hyperlipidemia   . Hypertension   . IBS (irritable bowel syndrome)   . Lung mass PER DR CLANCE NOTE UNCLEAR IF LYMPHOMA FROM BX   FOLLOWED BY DR RUBIN  . 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 Nocturia   . Osteoporosis   . PONV (postoperative nausea and vomiting)   .  Seasonal allergies   . Seizures ([place])   . Skin tear LEFT ARM COVERED W/ TEGADERM   PT STATES HX MULTIPLE SKIN TEARS -- THIN  . Thin skin SECONARDY AGE AND HX CHEMO  . Unspecified hereditary and idiopathic peripheral neuropathy   . Vertebral compression fracture (HCC) 5/14   L4     Family History  Problem Relation Age of Onset  . Emphysema Father   . CAD Father   . Ovarian cancer Mother   . Cancer Mother        cervical    . Alzheimer's disease Mother   . Hyperlipidemia Mother   . Breast cancer Mother   . Cancer Sister        cervical  . Breast cancer Sister   . Breast cancer Maternal Grandmother   . Breast cancer Maternal Aunt      Social History   Socioeconomic History  . Marital status: Married    Spouse name: Not on file  . Number of children: Not on file  . Years of education: Not on file  . Highest education level: Not on file  Social Needs  . Financial resource strain: Not on file  . Food insecurity - worry: Not on file  . Food insecurity - inability: Not on file  . Transportation needs - medical: Not on file  . Transportation needs - non-medical: Not on file  Occupational History  . Occupation: retired  Tobacco Use  . Smoking status: Former Smoker    Packs/day: 1.00    Years: 30.00    Pack years: 30.00    Types: Cigarettes    Last attempt to quit: 03/01/1978    Years since quitting: 39.0  . Smokeless tobacco: Never Used  Substance and Sexual Activity  . Alcohol use: Yes    Alcohol/week: 4.2 - 6.0 oz    Types: 7 - 10 Glasses of wine per week  . Drug use: No  . Sexual activity: No    Birth control/protection: Post-menopausal, Surgical  Other Topics Concern  . Not on file  Social History Narrative  . Not on file     Allergies  Allergen Reactions  . Penicillins Anaphylaxis  . Adhesive [Tape] Other (See Comments)    Blisters   . Doxycycline Nausea And Vomiting  . Morphine And Related Other (See Comments)    HALLUCINATIONS     Outpatient Medications Prior to Visit  Medication Sig Dispense Refill  . acetaminophen (TYLENOL) 500 MG tablet Take 2 tablets (1,000 mg total) by mouth every 6 (six) hours as needed for mild pain or moderate pain. 60 tablet 0  . albuterol (ACCUNEB) 1.25 MG/3ML nebulizer solution Take 1 ampule by nebulization every 6 (six) hours as needed for wheezing.    . benzonatate (TESSALON) 100 MG capsule Take 100 mg by mouth 3 (three) times daily as needed for  cough.    . budesonide-formoterol (SYMBICORT) 160-4.5 MCG/ACT inhaler Inhale 2 puffs into the lungs 2 (two) times daily. (PATIENT WAS GIVEN SAMPLES)--ALTERNATES WITH ADVAIR SAMPLES    . DULoxetine (CYMBALTA) 60 MG capsule Take 60 mg by mouth daily after lunch.     . ergocalciferol (VITAMIN D2) 50000 UNITS capsule Take 50,000 Units by mouth once a week. Every Tuesday.    . ezetimibe-simvastatin (VYTORIN) 10-80 MG per tablet Take 1 tablet by mouth at bedtime. Reported on 03/26/2015    . fluticasone (FLONASE) 50 MCG/ACT nasal spray Place 2 sprays into both nostrils daily. 16 g 5  . Fluticasone-Salmeterol (  ADVAIR DISKUS) 250-50 MCG/DOSE AEPB Inhale 1 puff into the lungs 2 (two) times daily. (PATIENT WAS GIVEN SAMPLES-ALTERNATES WITH SYMBICORT SAMPLES)    . gabapentin (NEURONTIN) 100 MG capsule Take 100 mg by mouth 3 (three) times daily.     Marland Kitchen levothyroxine (SYNTHROID, LEVOTHROID) 88 MCG tablet Take 88 mcg by mouth daily before breakfast. For hypothyroid    . loratadine (CLARITIN) 10 MG tablet Take 1 tablet (10 mg total) by mouth daily. 30 tablet 5  . LORazepam (ATIVAN) 0.5 MG tablet Take 0.5 mg by mouth every 8 (eight) hours as needed for anxiety. Anxiety    . pantoprazole (PROTONIX) 40 MG tablet Take 1 tablet (40 mg total) by mouth daily. 30 tablet 5  . aspirin EC 81 MG tablet Take 81 mg by mouth daily.     Facility-Administered Medications Prior to Visit  Medication Dose Route Frequency Provider Last Rate Last Dose  . fentaNYL (SUBLIMAZE) injection 25-50 mcg  25-50 mcg Intravenous Q5 min PRN Rod Mae, MD            Objective:   Physical Exam Vitals:   03/25/17 1401  BP: 124/70  Pulse: 95  SpO2: 95%  Weight: 131 lb (59.4 kg)  Height: 5\' 3"  (1.6 m)   Gen: Pleasant, elderly woman, well-nourished, in no distress,  normal affect  ENT: No lesions,  mouth clear,  oropharynx clear, no postnasal drip  Neck: No JVD, no stridor  Lungs: No use of accessory muscles, coughing with deep  breaths, no wheeze, few scattered bibasilar crackles  Cardiovascular: RRR, heart sounds normal, no murmur or gallops, trace pretibial peripheral edema  Musculoskeletal: No deformities, no cyanosis or clubbing  Neuro: alert, non focal  Skin: Warm, no lesions or rash     Assessment & Plan:  Chronic cough She continued to have cough, it may be some better with the improvement in her GERD regimen.  She also believes her allergic rhinitis is improved.  Continue her current allergy regimen.  I will temporarily increase her pantoprazole to twice a day to see if she gets more benefit.  We talked about throat clearing.  She does have some bronchiectasis on CT scan, interstitial disease and she likely needs.  We will plan this next time.  Also note that if her symptoms persist she may require bronchoscopy for airway inspection, culture data.  No evidence for overt obstruction on her pulmonary function testing today.  Baltazar Apo, MD, PhD 03/25/2017, 2:25 PM [facility] [phone] or if no answer [phone]

## 2017-03-25 NOTE — Progress Notes (Signed)
PFT done today. 

## 2017-03-28 ENCOUNTER — Other Ambulatory Visit: Payer: Self-pay | Admitting: Pharmacist

## 2017-03-28 ENCOUNTER — Ambulatory Visit: Payer: Self-pay | Admitting: Pharmacist

## 2017-03-28 NOTE — Patient Outreach (Addendum)
[name]) Care Management  03/28/2017  [place] 11-17-1934 [id number]   Patient was called to follow up on medication assistance with inhalers. Unfortunately, she did not answer the phone and her voicemail did not pick up to allow for a message to be left. Review of the notes from her office visit with Dr. Lamonte Sakai state Advair/Symbicort were discontinued.  Patient should no longer need medication assistance if these medications were discontinued.  Plan: Call patient back in 7-10 business days if I do not hear back from her. Consider closing the pharmacy case.  [name], PharmD, [place] Clinical Pharmacist [phone]

## 2017-04-04 ENCOUNTER — Other Ambulatory Visit: Payer: Self-pay | Admitting: Pharmacist

## 2017-04-04 NOTE — Patient Outreach (Signed)
[place] [facility]) Care Management  04/04/2017  [place] 23-Mar-1934 [id number]  Patient was called to follow up on medication assistance. HIPAA identifiers were obtained.  Patient confirmed  Dr. Lamonte Sakai discontinued Advair and Symbicort.  Patient said she was given a prescription for ProAir HFA. She reported the prescription had a copay of $45.    Patient said she did not feel like the [place] worked as well as the Health visitor.  Patient was encouraged to discuss treatment options with Dr. Lamonte Sakai at her 05/16/17 visit.  Plan:  Follow up with patient on 05/17/17 to see if she was started on new therapy and if there are any medication assistance options.  Elayne Guerin, PharmD, [place] Clinical Pharmacist [phone]

## 2017-04-05 DIAGNOSIS — M810 Age-related osteoporosis without current pathological fracture: Secondary | ICD-10-CM | POA: Diagnosis not present

## 2017-04-05 DIAGNOSIS — Z6821 Body mass index (BMI) 21.0-21.9, adult: Secondary | ICD-10-CM | POA: Diagnosis not present

## 2017-04-05 DIAGNOSIS — Z79899 Other long term (current) drug therapy: Secondary | ICD-10-CM | POA: Diagnosis not present

## 2017-04-13 DIAGNOSIS — Z6821 Body mass index (BMI) 21.0-21.9, adult: Secondary | ICD-10-CM | POA: Diagnosis not present

## 2017-04-13 DIAGNOSIS — R002 Palpitations: Secondary | ICD-10-CM | POA: Diagnosis not present

## 2017-04-13 DIAGNOSIS — H3530 Unspecified macular degeneration: Secondary | ICD-10-CM | POA: Diagnosis not present

## 2017-04-13 DIAGNOSIS — E038 Other specified hypothyroidism: Secondary | ICD-10-CM | POA: Diagnosis not present

## 2017-04-13 DIAGNOSIS — D126 Benign neoplasm of colon, unspecified: Secondary | ICD-10-CM | POA: Diagnosis not present

## 2017-04-13 DIAGNOSIS — Z1389 Encounter for screening for other disorder: Secondary | ICD-10-CM | POA: Diagnosis not present

## 2017-04-13 DIAGNOSIS — E7849 Other hyperlipidemia: Secondary | ICD-10-CM | POA: Diagnosis not present

## 2017-04-13 DIAGNOSIS — I10 Essential (primary) hypertension: Secondary | ICD-10-CM | POA: Diagnosis not present

## 2017-04-13 DIAGNOSIS — M5416 Radiculopathy, lumbar region: Secondary | ICD-10-CM | POA: Diagnosis not present

## 2017-04-13 DIAGNOSIS — E559 Vitamin D deficiency, unspecified: Secondary | ICD-10-CM | POA: Diagnosis not present

## 2017-04-13 DIAGNOSIS — R05 Cough: Secondary | ICD-10-CM | POA: Diagnosis not present

## 2017-04-13 DIAGNOSIS — C50919 Malignant neoplasm of unspecified site of unspecified female breast: Secondary | ICD-10-CM | POA: Diagnosis not present

## 2017-05-16 ENCOUNTER — Ambulatory Visit: Payer: PPO | Admitting: Emergency Medicine

## 2017-05-17 ENCOUNTER — Encounter: Payer: Self-pay | Admitting: Pharmacist

## 2017-05-17 ENCOUNTER — Other Ambulatory Visit: Payer: Self-pay | Admitting: Pharmacist

## 2017-05-17 NOTE — Patient Outreach (Signed)
[facility]) Care Management  05/17/2017  [place] 1934/03/30 [id number]   Patient was called to follow up after her appointment with Dr. Lamonte Sakai.  HIPAA identifiers were obtained. Patient confirmed that she canceled her appointment with Dr. Lamonte Sakai because her Aunt fell and has been living with her and she is her caregiver.  In addition, the patient said she did not feel like her medications were working and that she does not feel like she has acid reflux.    Patient reported still using Advair samples at night PRN. Patient was educated on how to use Advair. She does not have an active prescription of Advair and Dr. Lamonte Sakai did not feel as though the patient's symptoms were COPD in nature (GERD).    Plan: Since the patient does not have an active prescription for Advair, her case will be closed.  Patient has our contact information and understands she can call me at any time in the future if her provider starts her on another inhaler or if she has any medication related questions or concerns.  Elayne Guerin, PharmD, [place] Clinical Pharmacist [phone]

## 2017-07-19 DIAGNOSIS — M810 Age-related osteoporosis without current pathological fracture: Secondary | ICD-10-CM | POA: Diagnosis not present

## 2017-07-19 DIAGNOSIS — E559 Vitamin D deficiency, unspecified: Secondary | ICD-10-CM | POA: Diagnosis not present

## 2017-07-19 DIAGNOSIS — Z79899 Other long term (current) drug therapy: Secondary | ICD-10-CM | POA: Diagnosis not present

## 2017-07-19 DIAGNOSIS — Z681 Body mass index (BMI) 19 or less, adult: Secondary | ICD-10-CM | POA: Diagnosis not present

## 2017-07-26 DIAGNOSIS — J209 Acute bronchitis, unspecified: Secondary | ICD-10-CM | POA: Diagnosis not present

## 2017-07-26 DIAGNOSIS — J849 Interstitial pulmonary disease, unspecified: Secondary | ICD-10-CM | POA: Diagnosis not present

## 2017-07-26 DIAGNOSIS — Z681 Body mass index (BMI) 19 or less, adult: Secondary | ICD-10-CM | POA: Diagnosis not present

## 2017-07-26 DIAGNOSIS — R0609 Other forms of dyspnea: Secondary | ICD-10-CM | POA: Diagnosis not present

## 2017-07-26 DIAGNOSIS — R05 Cough: Secondary | ICD-10-CM | POA: Diagnosis not present

## 2017-07-29 ENCOUNTER — Other Ambulatory Visit (HOSPITAL_COMMUNITY): Payer: Self-pay

## 2017-08-01 ENCOUNTER — Ambulatory Visit (HOSPITAL_COMMUNITY)
Admission: RE | Admit: 2017-08-01 | Discharge: 2017-08-01 | Disposition: A | Discharge status: Home / Self Care | Payer: PPO | Source: Ambulatory Visit | Attending: Endocrinology | Admitting: Endocrinology

## 2017-08-01 DIAGNOSIS — M810 Age-related osteoporosis without current pathological fracture: Secondary | ICD-10-CM | POA: Insufficient documentation

## 2017-08-01 MED ORDER — ZOLEDRONIC ACID 5 MG/100ML IV SOLN
5.0000 mg | Freq: Once | INTRAVENOUS | Status: AC
  Administered 2017-08-01: 5 mg via INTRAVENOUS

## 2017-08-01 MED ORDER — ZOLEDRONIC ACID 5 MG/100ML IV SOLN
INTRAVENOUS | Status: AC
  Administered 2017-08-01: 12:00:00 5 mg via INTRAVENOUS
  Filled 2017-08-01: qty 100

## 2017-08-17 DIAGNOSIS — E038 Other specified hypothyroidism: Secondary | ICD-10-CM | POA: Diagnosis not present

## 2017-08-17 DIAGNOSIS — Z681 Body mass index (BMI) 19 or less, adult: Secondary | ICD-10-CM | POA: Diagnosis not present

## 2017-08-17 DIAGNOSIS — I480 Paroxysmal atrial fibrillation: Secondary | ICD-10-CM | POA: Diagnosis not present

## 2017-08-17 DIAGNOSIS — C50919 Malignant neoplasm of unspecified site of unspecified female breast: Secondary | ICD-10-CM | POA: Diagnosis not present

## 2017-08-17 DIAGNOSIS — E46 Unspecified protein-calorie malnutrition: Secondary | ICD-10-CM | POA: Diagnosis not present

## 2017-08-17 DIAGNOSIS — D6489 Other specified anemias: Secondary | ICD-10-CM | POA: Diagnosis not present

## 2017-08-17 DIAGNOSIS — E559 Vitamin D deficiency, unspecified: Secondary | ICD-10-CM | POA: Diagnosis not present

## 2017-08-17 DIAGNOSIS — E7849 Other hyperlipidemia: Secondary | ICD-10-CM | POA: Diagnosis not present

## 2017-08-17 DIAGNOSIS — J849 Interstitial pulmonary disease, unspecified: Secondary | ICD-10-CM | POA: Diagnosis not present

## 2017-08-17 DIAGNOSIS — S32009S Unspecified fracture of unspecified lumbar vertebra, sequela: Secondary | ICD-10-CM | POA: Diagnosis not present

## 2017-08-17 DIAGNOSIS — H3530 Unspecified macular degeneration: Secondary | ICD-10-CM | POA: Diagnosis not present

## 2017-08-17 DIAGNOSIS — E2749 Other adrenocortical insufficiency: Secondary | ICD-10-CM | POA: Diagnosis not present

## 2017-08-17 DIAGNOSIS — R569 Unspecified convulsions: Secondary | ICD-10-CM | POA: Diagnosis not present

## 2017-08-22 ENCOUNTER — Other Ambulatory Visit: Payer: Self-pay | Admitting: Emergency Medicine

## 2017-10-07 DIAGNOSIS — H5310 Unspecified subjective visual disturbances: Secondary | ICD-10-CM | POA: Diagnosis not present

## 2017-11-08 ENCOUNTER — Other Ambulatory Visit: Payer: Self-pay | Admitting: Endocrinology

## 2017-11-08 DIAGNOSIS — Z9889 Other specified postprocedural states: Secondary | ICD-10-CM

## 2017-11-08 DIAGNOSIS — N6310 Unspecified lump in the right breast, unspecified quadrant: Secondary | ICD-10-CM

## 2017-12-21 ENCOUNTER — Ambulatory Visit: Payer: PPO

## 2017-12-21 ENCOUNTER — Ambulatory Visit
Admission: RE | Admit: 2017-12-21 | Discharge: 2017-12-21 | Disposition: A | Discharge status: Home / Self Care | Payer: PPO | Source: Ambulatory Visit | Attending: Endocrinology | Admitting: Endocrinology

## 2017-12-21 DIAGNOSIS — R928 Other abnormal and inconclusive findings on diagnostic imaging of breast: Secondary | ICD-10-CM | POA: Diagnosis not present

## 2017-12-21 DIAGNOSIS — Z9889 Other specified postprocedural states: Secondary | ICD-10-CM

## 2017-12-21 DIAGNOSIS — Z853 Personal history of malignant neoplasm of breast: Secondary | ICD-10-CM | POA: Diagnosis not present

## 2017-12-21 DIAGNOSIS — N6310 Unspecified lump in the right breast, unspecified quadrant: Secondary | ICD-10-CM

## 2017-12-28 DIAGNOSIS — I480 Paroxysmal atrial fibrillation: Secondary | ICD-10-CM | POA: Diagnosis not present

## 2017-12-28 DIAGNOSIS — M810 Age-related osteoporosis without current pathological fracture: Secondary | ICD-10-CM | POA: Diagnosis not present

## 2017-12-28 DIAGNOSIS — E559 Vitamin D deficiency, unspecified: Secondary | ICD-10-CM | POA: Diagnosis not present

## 2017-12-28 DIAGNOSIS — I10 Essential (primary) hypertension: Secondary | ICD-10-CM | POA: Diagnosis not present

## 2017-12-28 DIAGNOSIS — E7849 Other hyperlipidemia: Secondary | ICD-10-CM | POA: Diagnosis not present

## 2017-12-28 DIAGNOSIS — E46 Unspecified protein-calorie malnutrition: Secondary | ICD-10-CM | POA: Diagnosis not present

## 2017-12-28 DIAGNOSIS — J849 Interstitial pulmonary disease, unspecified: Secondary | ICD-10-CM | POA: Diagnosis not present

## 2017-12-28 DIAGNOSIS — R569 Unspecified convulsions: Secondary | ICD-10-CM | POA: Diagnosis not present

## 2017-12-28 DIAGNOSIS — C50919 Malignant neoplasm of unspecified site of unspecified female breast: Secondary | ICD-10-CM | POA: Diagnosis not present

## 2017-12-28 DIAGNOSIS — H3530 Unspecified macular degeneration: Secondary | ICD-10-CM | POA: Diagnosis not present

## 2017-12-28 DIAGNOSIS — Z681 Body mass index (BMI) 19 or less, adult: Secondary | ICD-10-CM | POA: Diagnosis not present

## 2017-12-28 DIAGNOSIS — J438 Other emphysema: Secondary | ICD-10-CM | POA: Diagnosis not present

## 2018-01-04 DIAGNOSIS — J439 Emphysema, unspecified: Secondary | ICD-10-CM | POA: Diagnosis not present

## 2018-01-04 DIAGNOSIS — J449 Chronic obstructive pulmonary disease, unspecified: Secondary | ICD-10-CM | POA: Diagnosis not present

## 2018-01-05 DIAGNOSIS — I480 Paroxysmal atrial fibrillation: Secondary | ICD-10-CM | POA: Diagnosis not present

## 2018-01-05 DIAGNOSIS — J439 Emphysema, unspecified: Secondary | ICD-10-CM | POA: Diagnosis not present

## 2018-01-05 DIAGNOSIS — J449 Chronic obstructive pulmonary disease, unspecified: Secondary | ICD-10-CM | POA: Diagnosis not present

## 2018-01-31 DIAGNOSIS — J449 Chronic obstructive pulmonary disease, unspecified: Secondary | ICD-10-CM | POA: Diagnosis not present

## 2018-01-31 DIAGNOSIS — J439 Emphysema, unspecified: Secondary | ICD-10-CM | POA: Diagnosis not present

## 2018-02-04 DIAGNOSIS — J439 Emphysema, unspecified: Secondary | ICD-10-CM | POA: Diagnosis not present

## 2018-02-04 DIAGNOSIS — J449 Chronic obstructive pulmonary disease, unspecified: Secondary | ICD-10-CM | POA: Diagnosis not present

## 2018-02-04 DIAGNOSIS — I480 Paroxysmal atrial fibrillation: Secondary | ICD-10-CM | POA: Diagnosis not present

## 2018-02-28 DIAGNOSIS — J439 Emphysema, unspecified: Secondary | ICD-10-CM | POA: Diagnosis not present

## 2018-02-28 DIAGNOSIS — J449 Chronic obstructive pulmonary disease, unspecified: Secondary | ICD-10-CM | POA: Diagnosis not present

## 2018-03-07 DIAGNOSIS — I480 Paroxysmal atrial fibrillation: Secondary | ICD-10-CM | POA: Diagnosis not present

## 2018-03-07 DIAGNOSIS — J439 Emphysema, unspecified: Secondary | ICD-10-CM | POA: Diagnosis not present

## 2018-03-07 DIAGNOSIS — J449 Chronic obstructive pulmonary disease, unspecified: Secondary | ICD-10-CM | POA: Diagnosis not present

## 2018-04-07 DIAGNOSIS — J449 Chronic obstructive pulmonary disease, unspecified: Secondary | ICD-10-CM | POA: Diagnosis not present

## 2018-04-07 DIAGNOSIS — J439 Emphysema, unspecified: Secondary | ICD-10-CM | POA: Diagnosis not present

## 2018-04-07 DIAGNOSIS — I480 Paroxysmal atrial fibrillation: Secondary | ICD-10-CM | POA: Diagnosis not present

## 2018-04-11 DIAGNOSIS — H353132 Nonexudative age-related macular degeneration, bilateral, intermediate dry stage: Secondary | ICD-10-CM | POA: Diagnosis not present

## 2018-04-26 DIAGNOSIS — C50919 Malignant neoplasm of unspecified site of unspecified female breast: Secondary | ICD-10-CM | POA: Diagnosis not present

## 2018-04-26 DIAGNOSIS — E038 Other specified hypothyroidism: Secondary | ICD-10-CM | POA: Diagnosis not present

## 2018-04-26 DIAGNOSIS — I480 Paroxysmal atrial fibrillation: Secondary | ICD-10-CM | POA: Diagnosis not present

## 2018-04-26 DIAGNOSIS — I10 Essential (primary) hypertension: Secondary | ICD-10-CM | POA: Diagnosis not present

## 2018-04-26 DIAGNOSIS — E559 Vitamin D deficiency, unspecified: Secondary | ICD-10-CM | POA: Diagnosis not present

## 2018-04-26 DIAGNOSIS — I739 Peripheral vascular disease, unspecified: Secondary | ICD-10-CM | POA: Diagnosis not present

## 2018-04-26 DIAGNOSIS — H3530 Unspecified macular degeneration: Secondary | ICD-10-CM | POA: Diagnosis not present

## 2018-04-26 DIAGNOSIS — J849 Interstitial pulmonary disease, unspecified: Secondary | ICD-10-CM | POA: Diagnosis not present

## 2018-04-26 DIAGNOSIS — E7849 Other hyperlipidemia: Secondary | ICD-10-CM | POA: Diagnosis not present

## 2018-04-26 DIAGNOSIS — D126 Benign neoplasm of colon, unspecified: Secondary | ICD-10-CM | POA: Diagnosis not present

## 2018-04-26 DIAGNOSIS — M810 Age-related osteoporosis without current pathological fracture: Secondary | ICD-10-CM | POA: Diagnosis not present

## 2018-04-26 DIAGNOSIS — F418 Other specified anxiety disorders: Secondary | ICD-10-CM | POA: Diagnosis not present

## 2018-05-06 DIAGNOSIS — J449 Chronic obstructive pulmonary disease, unspecified: Secondary | ICD-10-CM | POA: Diagnosis not present

## 2018-05-06 DIAGNOSIS — I480 Paroxysmal atrial fibrillation: Secondary | ICD-10-CM | POA: Diagnosis not present

## 2018-05-06 DIAGNOSIS — J439 Emphysema, unspecified: Secondary | ICD-10-CM | POA: Diagnosis not present

## 2018-05-15 ENCOUNTER — Other Ambulatory Visit: Payer: Self-pay | Admitting: Pharmacist

## 2018-05-15 NOTE — Patient Outreach (Signed)
[facility]) Care Management  [place] - Medication Adherence   05/15/2018  [place] 1935/02/20 [id number]  [facility] Medication: simvastatin 80 mg Date & Supply of last refill: 03/26/2018, 90 day supply Current insurance:[employer]   Outreach:  Incoming call from [facility] in response to the [facility] Medication Adherence Campaign. Speak with patient. HIPAA identifiers verified.  Subjective:   Medication Adherence  Ms. Mattie reports that she is taking her simvastatin 80 mg each night as directed. Denies any current missed doses or barriers to adherence. Reports that she did miss doses several months ago when she was frustrated with her pill burden. Counsel patient on the importance of medication adherence. Patient verbalizes understanding. Counsel patient on use of a weekly pillbox as an adherence tool. Ms. Reitman states that she is going to start using one.  Medication Assistance  Ms. Salada reports difficulty with affording her Advair inhaler. Counsel patient about Part D benefit Also counsel patient about importance of adherence to inhalers and about rinsing mouth out after each use of Advair. Ms. Gasparyan reports that she no longer sees a Pulmonologist and that her PCP manages her inhalers and that she is now on oxygen. Reports that she has been just using samples of the medication that she receives from her PCP because she was not sure how expensive it would be through her plan. Reports that she will call her PCP today to get her inhalers called into her pharmacy for refill.   Objective: Lab Results  Component Value Date   CREATININE 0.84 04/17/2014   CREATININE 0.90 04/15/2014   CREATININE 0.99 04/13/2014    Lab Results  Component Value Date   HGBA1C 5.3 11/04/2010    Lipid Panel     Component Value Date/Time   CHOL 147 11/04/2010 0646   TRIG 62 11/04/2010 0646   HDL 54 11/04/2010 0646   CHOLHDL 2.7 11/04/2010 0646   VLDL 12 11/04/2010 0646   LDLCALC 81 11/04/2010 0646    BP Readings from Last 3 Encounters:  08/01/17 140/69  03/25/17 124/70  02/16/17 116/78    Allergies  Allergen Reactions  . Penicillins Anaphylaxis  . Adhesive [Tape] Other (See Comments)    Blisters   . Doxycycline Nausea And Vomiting  . Morphine And Related Other (See Comments)    HALLUCINATIONS     Assessment:  Barriers identified: . Pill burden - counseled on importance of adherence . Medication affordability (Advair and Ventolin inhalers)   Medication Assistance Findings:  Extra Help:   [] Already receiving Full Extra Help  [] Already receiving Partial Extra Help  [] Eligible based on reported income and assets  [x] Not Eligible based on reported income and assets  Patient Assistance Programs: 1) Advair and Ventolin made by [place] o Income requirement met: [x] Yes [] No [] Unknown o Out-of-pocket prescription expenditure met:    [] Yes [] No  [x] Unknown  [] Not applicable - $559   Plan:  1) Ms. Purdon to call her PCP to request prescriptions of her inhalers be called into her pharmacy  2) Patient to obtain and start using a weekly pillbox to organize her medications/aid with adherence.  3) Will contact [employer] to determine how much Ms. Galen has spent out of pocket so far for the calendar year.   Harlow Asa, PharmD, [place] Management [phone]

## 2018-05-19 ENCOUNTER — Other Ambulatory Visit: Payer: Self-pay | Admitting: Pharmacist

## 2018-05-22 NOTE — Patient Outreach (Signed)
[name] [name]) Care Management  05/22/2018  [place] 08/29/1934 [id number]   Call to follow up with [name] regarding her inhalers and regarding medication assistance. HIPAA identifiers verified.  [name] reports that she forgot to call her PCP office regarding her need for a refill of her Advair inhaler earlier in the week. Patient reports that she is now completely out of the medication.  I call to patient's PCP office now and leave a message with patient's nurse requesting that a prescription for the patient's Advair inhaler be called into her pharmacy and let provider know that she is out of the medication.  Receive a call back from [name] later in the afternoon letting me know that her Advair inhaler prescription was called in and that she have this medication picked up. Patient confirms that she will use this medication twice daily on a scheduled basis as directed, rinsing her mouth out after each use.   Patient expresses appreciation for my help. States that she will call me when she is getting close to meeting the out of pocket spend requirement for the [place] patient assistance program. Provide patient with my phone number. She denies any further medication questions/concerns at this time.  PLAN  Will close pharmacy episode.  Harlow Asa, PharmD, [place] Management [phone]

## 2018-06-06 DIAGNOSIS — I480 Paroxysmal atrial fibrillation: Secondary | ICD-10-CM | POA: Diagnosis not present

## 2018-06-06 DIAGNOSIS — J439 Emphysema, unspecified: Secondary | ICD-10-CM | POA: Diagnosis not present

## 2018-06-06 DIAGNOSIS — J449 Chronic obstructive pulmonary disease, unspecified: Secondary | ICD-10-CM | POA: Diagnosis not present

## 2018-06-23 DIAGNOSIS — J439 Emphysema, unspecified: Secondary | ICD-10-CM | POA: Diagnosis not present

## 2018-06-23 DIAGNOSIS — J449 Chronic obstructive pulmonary disease, unspecified: Secondary | ICD-10-CM | POA: Diagnosis not present

## 2018-07-06 DIAGNOSIS — I480 Paroxysmal atrial fibrillation: Secondary | ICD-10-CM | POA: Diagnosis not present

## 2018-07-06 DIAGNOSIS — J449 Chronic obstructive pulmonary disease, unspecified: Secondary | ICD-10-CM | POA: Diagnosis not present

## 2018-07-06 DIAGNOSIS — J439 Emphysema, unspecified: Secondary | ICD-10-CM | POA: Diagnosis not present

## 2018-08-06 DIAGNOSIS — J449 Chronic obstructive pulmonary disease, unspecified: Secondary | ICD-10-CM | POA: Diagnosis not present

## 2018-08-06 DIAGNOSIS — I480 Paroxysmal atrial fibrillation: Secondary | ICD-10-CM | POA: Diagnosis not present

## 2018-08-06 DIAGNOSIS — J439 Emphysema, unspecified: Secondary | ICD-10-CM | POA: Diagnosis not present

## 2018-08-24 DIAGNOSIS — Z79899 Other long term (current) drug therapy: Secondary | ICD-10-CM | POA: Diagnosis not present

## 2018-08-24 DIAGNOSIS — M810 Age-related osteoporosis without current pathological fracture: Secondary | ICD-10-CM | POA: Diagnosis not present

## 2018-09-05 DIAGNOSIS — J449 Chronic obstructive pulmonary disease, unspecified: Secondary | ICD-10-CM | POA: Diagnosis not present

## 2018-09-05 DIAGNOSIS — J439 Emphysema, unspecified: Secondary | ICD-10-CM | POA: Diagnosis not present

## 2018-09-05 DIAGNOSIS — I480 Paroxysmal atrial fibrillation: Secondary | ICD-10-CM | POA: Diagnosis not present

## 2018-09-06 ENCOUNTER — Other Ambulatory Visit (HOSPITAL_COMMUNITY): Payer: Self-pay | Admitting: *Deleted

## 2018-09-07 ENCOUNTER — Other Ambulatory Visit: Payer: Self-pay

## 2018-09-07 ENCOUNTER — Ambulatory Visit (HOSPITAL_COMMUNITY)
Admission: RE | Admit: 2018-09-07 | Discharge: 2018-09-07 | Disposition: A | Discharge status: Home / Self Care | Payer: PPO | Source: Ambulatory Visit | Attending: Endocrinology | Admitting: Endocrinology

## 2018-09-07 DIAGNOSIS — M810 Age-related osteoporosis without current pathological fracture: Secondary | ICD-10-CM | POA: Insufficient documentation

## 2018-09-07 MED ORDER — ZOLEDRONIC ACID 5 MG/100ML IV SOLN
INTRAVENOUS | Status: AC
  Administered 2018-09-07: 5 mg via INTRAVENOUS
  Filled 2018-09-07: qty 100

## 2018-09-07 MED ORDER — ZOLEDRONIC ACID 5 MG/100ML IV SOLN
5.0000 mg | Freq: Once | INTRAVENOUS | Status: AC
  Administered 2018-09-07: 13:00:00 5 mg via INTRAVENOUS

## 2018-10-06 DIAGNOSIS — J449 Chronic obstructive pulmonary disease, unspecified: Secondary | ICD-10-CM | POA: Diagnosis not present

## 2018-10-06 DIAGNOSIS — J439 Emphysema, unspecified: Secondary | ICD-10-CM | POA: Diagnosis not present

## 2018-10-06 DIAGNOSIS — I480 Paroxysmal atrial fibrillation: Secondary | ICD-10-CM | POA: Diagnosis not present

## 2018-10-10 DIAGNOSIS — Z961 Presence of intraocular lens: Secondary | ICD-10-CM | POA: Diagnosis not present

## 2018-10-10 DIAGNOSIS — H353132 Nonexudative age-related macular degeneration, bilateral, intermediate dry stage: Secondary | ICD-10-CM | POA: Diagnosis not present

## 2018-10-23 DIAGNOSIS — I480 Paroxysmal atrial fibrillation: Secondary | ICD-10-CM | POA: Diagnosis not present

## 2018-10-23 DIAGNOSIS — J439 Emphysema, unspecified: Secondary | ICD-10-CM | POA: Diagnosis not present

## 2018-10-23 DIAGNOSIS — R269 Unspecified abnormalities of gait and mobility: Secondary | ICD-10-CM | POA: Diagnosis not present

## 2018-10-23 DIAGNOSIS — Z1331 Encounter for screening for depression: Secondary | ICD-10-CM | POA: Diagnosis not present

## 2018-10-23 DIAGNOSIS — C50919 Malignant neoplasm of unspecified site of unspecified female breast: Secondary | ICD-10-CM | POA: Diagnosis not present

## 2018-10-23 DIAGNOSIS — I739 Peripheral vascular disease, unspecified: Secondary | ICD-10-CM | POA: Diagnosis not present

## 2018-10-23 DIAGNOSIS — H3530 Unspecified macular degeneration: Secondary | ICD-10-CM | POA: Diagnosis not present

## 2018-10-23 DIAGNOSIS — M810 Age-related osteoporosis without current pathological fracture: Secondary | ICD-10-CM | POA: Diagnosis not present

## 2018-10-23 DIAGNOSIS — J849 Interstitial pulmonary disease, unspecified: Secondary | ICD-10-CM | POA: Diagnosis not present

## 2018-10-23 DIAGNOSIS — E2749 Other adrenocortical insufficiency: Secondary | ICD-10-CM | POA: Diagnosis not present

## 2018-10-23 DIAGNOSIS — E785 Hyperlipidemia, unspecified: Secondary | ICD-10-CM | POA: Diagnosis not present

## 2018-10-23 DIAGNOSIS — E039 Hypothyroidism, unspecified: Secondary | ICD-10-CM | POA: Diagnosis not present

## 2018-10-27 DIAGNOSIS — Z23 Encounter for immunization: Secondary | ICD-10-CM | POA: Diagnosis not present

## 2018-11-06 DIAGNOSIS — J449 Chronic obstructive pulmonary disease, unspecified: Secondary | ICD-10-CM | POA: Diagnosis not present

## 2018-11-06 DIAGNOSIS — J439 Emphysema, unspecified: Secondary | ICD-10-CM | POA: Diagnosis not present

## 2018-11-06 DIAGNOSIS — I480 Paroxysmal atrial fibrillation: Secondary | ICD-10-CM | POA: Diagnosis not present

## 2018-12-06 DIAGNOSIS — J449 Chronic obstructive pulmonary disease, unspecified: Secondary | ICD-10-CM | POA: Diagnosis not present

## 2018-12-06 DIAGNOSIS — J439 Emphysema, unspecified: Secondary | ICD-10-CM | POA: Diagnosis not present

## 2018-12-06 DIAGNOSIS — I480 Paroxysmal atrial fibrillation: Secondary | ICD-10-CM | POA: Diagnosis not present

## 2018-12-26 DIAGNOSIS — J449 Chronic obstructive pulmonary disease, unspecified: Secondary | ICD-10-CM | POA: Diagnosis not present

## 2018-12-26 DIAGNOSIS — J439 Emphysema, unspecified: Secondary | ICD-10-CM | POA: Diagnosis not present

## 2019-01-06 DIAGNOSIS — J449 Chronic obstructive pulmonary disease, unspecified: Secondary | ICD-10-CM | POA: Diagnosis not present

## 2019-01-06 DIAGNOSIS — J439 Emphysema, unspecified: Secondary | ICD-10-CM | POA: Diagnosis not present

## 2019-01-06 DIAGNOSIS — I480 Paroxysmal atrial fibrillation: Secondary | ICD-10-CM | POA: Diagnosis not present

## 2019-02-05 DIAGNOSIS — J439 Emphysema, unspecified: Secondary | ICD-10-CM | POA: Diagnosis not present

## 2019-02-05 DIAGNOSIS — J449 Chronic obstructive pulmonary disease, unspecified: Secondary | ICD-10-CM | POA: Diagnosis not present

## 2019-02-07 DIAGNOSIS — E785 Hyperlipidemia, unspecified: Secondary | ICD-10-CM | POA: Diagnosis not present

## 2019-02-07 DIAGNOSIS — G629 Polyneuropathy, unspecified: Secondary | ICD-10-CM | POA: Diagnosis not present

## 2019-02-07 DIAGNOSIS — J439 Emphysema, unspecified: Secondary | ICD-10-CM | POA: Diagnosis not present

## 2019-02-07 DIAGNOSIS — E46 Unspecified protein-calorie malnutrition: Secondary | ICD-10-CM | POA: Diagnosis not present

## 2019-02-07 DIAGNOSIS — E039 Hypothyroidism, unspecified: Secondary | ICD-10-CM | POA: Diagnosis not present

## 2019-02-07 DIAGNOSIS — C50919 Malignant neoplasm of unspecified site of unspecified female breast: Secondary | ICD-10-CM | POA: Diagnosis not present

## 2019-02-07 DIAGNOSIS — I10 Essential (primary) hypertension: Secondary | ICD-10-CM | POA: Diagnosis not present

## 2019-02-07 DIAGNOSIS — I480 Paroxysmal atrial fibrillation: Secondary | ICD-10-CM | POA: Diagnosis not present

## 2019-02-07 DIAGNOSIS — M810 Age-related osteoporosis without current pathological fracture: Secondary | ICD-10-CM | POA: Diagnosis not present

## 2019-02-07 DIAGNOSIS — F418 Other specified anxiety disorders: Secondary | ICD-10-CM | POA: Diagnosis not present

## 2019-02-07 DIAGNOSIS — R569 Unspecified convulsions: Secondary | ICD-10-CM | POA: Diagnosis not present

## 2019-03-08 DIAGNOSIS — J449 Chronic obstructive pulmonary disease, unspecified: Secondary | ICD-10-CM | POA: Diagnosis not present

## 2019-03-08 DIAGNOSIS — J439 Emphysema, unspecified: Secondary | ICD-10-CM | POA: Diagnosis not present

## 2019-04-01 ENCOUNTER — Ambulatory Visit: Payer: PPO

## 2019-04-06 ENCOUNTER — Ambulatory Visit: Payer: PPO

## 2019-04-07 ENCOUNTER — Ambulatory Visit: Payer: PPO

## 2019-04-08 DIAGNOSIS — J439 Emphysema, unspecified: Secondary | ICD-10-CM | POA: Diagnosis not present

## 2019-04-08 DIAGNOSIS — J449 Chronic obstructive pulmonary disease, unspecified: Secondary | ICD-10-CM | POA: Diagnosis not present

## 2019-04-12 DIAGNOSIS — H5310 Unspecified subjective visual disturbances: Secondary | ICD-10-CM | POA: Diagnosis not present

## 2019-04-12 DIAGNOSIS — H524 Presbyopia: Secondary | ICD-10-CM | POA: Diagnosis not present

## 2019-04-12 DIAGNOSIS — H353132 Nonexudative age-related macular degeneration, bilateral, intermediate dry stage: Secondary | ICD-10-CM | POA: Diagnosis not present

## 2019-05-06 DIAGNOSIS — J439 Emphysema, unspecified: Secondary | ICD-10-CM | POA: Diagnosis not present

## 2019-05-06 DIAGNOSIS — J449 Chronic obstructive pulmonary disease, unspecified: Secondary | ICD-10-CM | POA: Diagnosis not present

## 2019-06-06 DIAGNOSIS — E039 Hypothyroidism, unspecified: Secondary | ICD-10-CM | POA: Diagnosis not present

## 2019-06-06 DIAGNOSIS — H3530 Unspecified macular degeneration: Secondary | ICD-10-CM | POA: Diagnosis not present

## 2019-06-06 DIAGNOSIS — I480 Paroxysmal atrial fibrillation: Secondary | ICD-10-CM | POA: Diagnosis not present

## 2019-06-06 DIAGNOSIS — J849 Interstitial pulmonary disease, unspecified: Secondary | ICD-10-CM | POA: Diagnosis not present

## 2019-06-06 DIAGNOSIS — I10 Essential (primary) hypertension: Secondary | ICD-10-CM | POA: Diagnosis not present

## 2019-06-06 DIAGNOSIS — F418 Other specified anxiety disorders: Secondary | ICD-10-CM | POA: Diagnosis not present

## 2019-06-06 DIAGNOSIS — I700 Atherosclerosis of aorta: Secondary | ICD-10-CM | POA: Diagnosis not present

## 2019-06-06 DIAGNOSIS — M810 Age-related osteoporosis without current pathological fracture: Secondary | ICD-10-CM | POA: Diagnosis not present

## 2019-06-06 DIAGNOSIS — R569 Unspecified convulsions: Secondary | ICD-10-CM | POA: Diagnosis not present

## 2019-06-06 DIAGNOSIS — C50919 Malignant neoplasm of unspecified site of unspecified female breast: Secondary | ICD-10-CM | POA: Diagnosis not present

## 2019-06-06 DIAGNOSIS — E7849 Other hyperlipidemia: Secondary | ICD-10-CM | POA: Diagnosis not present

## 2019-06-06 DIAGNOSIS — J449 Chronic obstructive pulmonary disease, unspecified: Secondary | ICD-10-CM | POA: Diagnosis not present

## 2019-06-06 DIAGNOSIS — J439 Emphysema, unspecified: Secondary | ICD-10-CM | POA: Diagnosis not present

## 2019-06-06 DIAGNOSIS — E559 Vitamin D deficiency, unspecified: Secondary | ICD-10-CM | POA: Diagnosis not present

## 2019-07-06 DIAGNOSIS — J439 Emphysema, unspecified: Secondary | ICD-10-CM | POA: Diagnosis not present

## 2019-07-06 DIAGNOSIS — J449 Chronic obstructive pulmonary disease, unspecified: Secondary | ICD-10-CM | POA: Diagnosis not present

## 2019-08-06 DIAGNOSIS — J439 Emphysema, unspecified: Secondary | ICD-10-CM | POA: Diagnosis not present

## 2019-08-06 DIAGNOSIS — J449 Chronic obstructive pulmonary disease, unspecified: Secondary | ICD-10-CM | POA: Diagnosis not present

## 2019-09-05 DIAGNOSIS — J439 Emphysema, unspecified: Secondary | ICD-10-CM | POA: Diagnosis not present

## 2019-09-05 DIAGNOSIS — J449 Chronic obstructive pulmonary disease, unspecified: Secondary | ICD-10-CM | POA: Diagnosis not present

## 2019-10-06 DIAGNOSIS — J449 Chronic obstructive pulmonary disease, unspecified: Secondary | ICD-10-CM | POA: Diagnosis not present

## 2019-10-06 DIAGNOSIS — J439 Emphysema, unspecified: Secondary | ICD-10-CM | POA: Diagnosis not present

## 2019-10-08 DIAGNOSIS — J441 Chronic obstructive pulmonary disease with (acute) exacerbation: Secondary | ICD-10-CM | POA: Diagnosis not present

## 2019-10-08 DIAGNOSIS — E785 Hyperlipidemia, unspecified: Secondary | ICD-10-CM | POA: Diagnosis not present

## 2019-10-08 DIAGNOSIS — E46 Unspecified protein-calorie malnutrition: Secondary | ICD-10-CM | POA: Diagnosis not present

## 2019-10-08 DIAGNOSIS — E039 Hypothyroidism, unspecified: Secondary | ICD-10-CM | POA: Diagnosis not present

## 2019-10-08 DIAGNOSIS — I10 Essential (primary) hypertension: Secondary | ICD-10-CM | POA: Diagnosis not present

## 2019-10-08 DIAGNOSIS — E041 Nontoxic single thyroid nodule: Secondary | ICD-10-CM | POA: Diagnosis not present

## 2019-10-08 DIAGNOSIS — E559 Vitamin D deficiency, unspecified: Secondary | ICD-10-CM | POA: Diagnosis not present

## 2019-10-08 DIAGNOSIS — I739 Peripheral vascular disease, unspecified: Secondary | ICD-10-CM | POA: Diagnosis not present

## 2019-10-08 DIAGNOSIS — C50919 Malignant neoplasm of unspecified site of unspecified female breast: Secondary | ICD-10-CM | POA: Diagnosis not present

## 2019-10-08 DIAGNOSIS — I700 Atherosclerosis of aorta: Secondary | ICD-10-CM | POA: Diagnosis not present

## 2019-10-08 DIAGNOSIS — F332 Major depressive disorder, recurrent severe without psychotic features: Secondary | ICD-10-CM | POA: Diagnosis not present

## 2019-10-08 DIAGNOSIS — I480 Paroxysmal atrial fibrillation: Secondary | ICD-10-CM | POA: Diagnosis not present

## 2019-11-06 DIAGNOSIS — J449 Chronic obstructive pulmonary disease, unspecified: Secondary | ICD-10-CM | POA: Diagnosis not present

## 2019-11-06 DIAGNOSIS — J439 Emphysema, unspecified: Secondary | ICD-10-CM | POA: Diagnosis not present

## 2019-12-06 DIAGNOSIS — J439 Emphysema, unspecified: Secondary | ICD-10-CM | POA: Diagnosis not present

## 2019-12-06 DIAGNOSIS — J449 Chronic obstructive pulmonary disease, unspecified: Secondary | ICD-10-CM | POA: Diagnosis not present

## 2019-12-07 DIAGNOSIS — H353132 Nonexudative age-related macular degeneration, bilateral, intermediate dry stage: Secondary | ICD-10-CM | POA: Diagnosis not present

## 2019-12-12 ENCOUNTER — Other Ambulatory Visit: Payer: Self-pay | Admitting: Nurse Practitioner

## 2020-01-06 DIAGNOSIS — J439 Emphysema, unspecified: Secondary | ICD-10-CM | POA: Diagnosis not present

## 2020-01-06 DIAGNOSIS — J449 Chronic obstructive pulmonary disease, unspecified: Secondary | ICD-10-CM | POA: Diagnosis not present

## 2020-02-05 DIAGNOSIS — J449 Chronic obstructive pulmonary disease, unspecified: Secondary | ICD-10-CM | POA: Diagnosis not present

## 2020-02-05 DIAGNOSIS — J439 Emphysema, unspecified: Secondary | ICD-10-CM | POA: Diagnosis not present

## 2020-02-11 DIAGNOSIS — I499 Cardiac arrhythmia, unspecified: Secondary | ICD-10-CM | POA: Diagnosis not present

## 2020-02-11 DIAGNOSIS — F332 Major depressive disorder, recurrent severe without psychotic features: Secondary | ICD-10-CM | POA: Diagnosis not present

## 2020-02-11 DIAGNOSIS — I739 Peripheral vascular disease, unspecified: Secondary | ICD-10-CM | POA: Diagnosis not present

## 2020-02-11 DIAGNOSIS — M810 Age-related osteoporosis without current pathological fracture: Secondary | ICD-10-CM | POA: Diagnosis not present

## 2020-02-11 DIAGNOSIS — R269 Unspecified abnormalities of gait and mobility: Secondary | ICD-10-CM | POA: Diagnosis not present

## 2020-02-11 DIAGNOSIS — J849 Interstitial pulmonary disease, unspecified: Secondary | ICD-10-CM | POA: Diagnosis not present

## 2020-02-11 DIAGNOSIS — I10 Essential (primary) hypertension: Secondary | ICD-10-CM | POA: Diagnosis not present

## 2020-02-11 DIAGNOSIS — E785 Hyperlipidemia, unspecified: Secondary | ICD-10-CM | POA: Diagnosis not present

## 2020-02-11 DIAGNOSIS — E46 Unspecified protein-calorie malnutrition: Secondary | ICD-10-CM | POA: Diagnosis not present

## 2020-02-11 DIAGNOSIS — E039 Hypothyroidism, unspecified: Secondary | ICD-10-CM | POA: Diagnosis not present

## 2020-02-11 DIAGNOSIS — G629 Polyneuropathy, unspecified: Secondary | ICD-10-CM | POA: Diagnosis not present

## 2020-02-11 DIAGNOSIS — I480 Paroxysmal atrial fibrillation: Secondary | ICD-10-CM | POA: Diagnosis not present

## 2020-03-07 DIAGNOSIS — J439 Emphysema, unspecified: Secondary | ICD-10-CM | POA: Diagnosis not present

## 2020-03-07 DIAGNOSIS — J449 Chronic obstructive pulmonary disease, unspecified: Secondary | ICD-10-CM | POA: Diagnosis not present

## 2020-04-07 DIAGNOSIS — J449 Chronic obstructive pulmonary disease, unspecified: Secondary | ICD-10-CM | POA: Diagnosis not present

## 2020-04-07 DIAGNOSIS — J439 Emphysema, unspecified: Secondary | ICD-10-CM | POA: Diagnosis not present

## 2020-05-05 DIAGNOSIS — J449 Chronic obstructive pulmonary disease, unspecified: Secondary | ICD-10-CM | POA: Diagnosis not present

## 2020-05-05 DIAGNOSIS — J439 Emphysema, unspecified: Secondary | ICD-10-CM | POA: Diagnosis not present

## 2020-06-05 DIAGNOSIS — J439 Emphysema, unspecified: Secondary | ICD-10-CM | POA: Diagnosis not present

## 2020-06-05 DIAGNOSIS — J449 Chronic obstructive pulmonary disease, unspecified: Secondary | ICD-10-CM | POA: Diagnosis not present

## 2020-06-09 DIAGNOSIS — H353132 Nonexudative age-related macular degeneration, bilateral, intermediate dry stage: Secondary | ICD-10-CM | POA: Diagnosis not present

## 2020-06-17 DIAGNOSIS — F332 Major depressive disorder, recurrent severe without psychotic features: Secondary | ICD-10-CM | POA: Diagnosis not present

## 2020-06-17 DIAGNOSIS — F418 Other specified anxiety disorders: Secondary | ICD-10-CM | POA: Diagnosis not present

## 2020-06-17 DIAGNOSIS — E46 Unspecified protein-calorie malnutrition: Secondary | ICD-10-CM | POA: Diagnosis not present

## 2020-06-17 DIAGNOSIS — M810 Age-related osteoporosis without current pathological fracture: Secondary | ICD-10-CM | POA: Diagnosis not present

## 2020-06-17 DIAGNOSIS — G629 Polyneuropathy, unspecified: Secondary | ICD-10-CM | POA: Diagnosis not present

## 2020-06-17 DIAGNOSIS — E785 Hyperlipidemia, unspecified: Secondary | ICD-10-CM | POA: Diagnosis not present

## 2020-06-17 DIAGNOSIS — J849 Interstitial pulmonary disease, unspecified: Secondary | ICD-10-CM | POA: Diagnosis not present

## 2020-06-17 DIAGNOSIS — I10 Essential (primary) hypertension: Secondary | ICD-10-CM | POA: Diagnosis not present

## 2020-06-17 DIAGNOSIS — I739 Peripheral vascular disease, unspecified: Secondary | ICD-10-CM | POA: Diagnosis not present

## 2020-06-17 DIAGNOSIS — I700 Atherosclerosis of aorta: Secondary | ICD-10-CM | POA: Diagnosis not present

## 2020-07-05 DIAGNOSIS — J439 Emphysema, unspecified: Secondary | ICD-10-CM | POA: Diagnosis not present

## 2020-07-05 DIAGNOSIS — J449 Chronic obstructive pulmonary disease, unspecified: Secondary | ICD-10-CM | POA: Diagnosis not present

## 2020-08-05 DIAGNOSIS — J439 Emphysema, unspecified: Secondary | ICD-10-CM | POA: Diagnosis not present

## 2020-08-05 DIAGNOSIS — J449 Chronic obstructive pulmonary disease, unspecified: Secondary | ICD-10-CM | POA: Diagnosis not present

## 2020-09-04 DIAGNOSIS — J439 Emphysema, unspecified: Secondary | ICD-10-CM | POA: Diagnosis not present

## 2020-09-04 DIAGNOSIS — J449 Chronic obstructive pulmonary disease, unspecified: Secondary | ICD-10-CM | POA: Diagnosis not present

## 2020-10-05 DIAGNOSIS — J439 Emphysema, unspecified: Secondary | ICD-10-CM | POA: Diagnosis not present

## 2020-10-05 DIAGNOSIS — J449 Chronic obstructive pulmonary disease, unspecified: Secondary | ICD-10-CM | POA: Diagnosis not present

## 2020-11-05 DIAGNOSIS — J439 Emphysema, unspecified: Secondary | ICD-10-CM | POA: Diagnosis not present

## 2020-11-05 DIAGNOSIS — J449 Chronic obstructive pulmonary disease, unspecified: Secondary | ICD-10-CM | POA: Diagnosis not present

## 2020-12-03 ENCOUNTER — Other Ambulatory Visit (HOSPITAL_COMMUNITY): Payer: Self-pay | Admitting: Endocrinology

## 2020-12-03 DIAGNOSIS — D649 Anemia, unspecified: Secondary | ICD-10-CM | POA: Diagnosis not present

## 2020-12-03 DIAGNOSIS — I10 Essential (primary) hypertension: Secondary | ICD-10-CM | POA: Diagnosis not present

## 2020-12-03 DIAGNOSIS — E785 Hyperlipidemia, unspecified: Secondary | ICD-10-CM | POA: Diagnosis not present

## 2020-12-03 DIAGNOSIS — M79662 Pain in left lower leg: Secondary | ICD-10-CM

## 2020-12-03 DIAGNOSIS — J849 Interstitial pulmonary disease, unspecified: Secondary | ICD-10-CM | POA: Diagnosis not present

## 2020-12-03 DIAGNOSIS — R600 Localized edema: Secondary | ICD-10-CM | POA: Diagnosis not present

## 2020-12-03 DIAGNOSIS — I700 Atherosclerosis of aorta: Secondary | ICD-10-CM | POA: Diagnosis not present

## 2020-12-03 DIAGNOSIS — M7989 Other specified soft tissue disorders: Secondary | ICD-10-CM

## 2020-12-03 DIAGNOSIS — R269 Unspecified abnormalities of gait and mobility: Secondary | ICD-10-CM | POA: Diagnosis not present

## 2020-12-03 DIAGNOSIS — E039 Hypothyroidism, unspecified: Secondary | ICD-10-CM | POA: Diagnosis not present

## 2020-12-03 DIAGNOSIS — I739 Peripheral vascular disease, unspecified: Secondary | ICD-10-CM | POA: Diagnosis not present

## 2020-12-03 DIAGNOSIS — I480 Paroxysmal atrial fibrillation: Secondary | ICD-10-CM | POA: Diagnosis not present

## 2020-12-03 DIAGNOSIS — E46 Unspecified protein-calorie malnutrition: Secondary | ICD-10-CM | POA: Diagnosis not present

## 2020-12-03 DIAGNOSIS — F332 Major depressive disorder, recurrent severe without psychotic features: Secondary | ICD-10-CM | POA: Diagnosis not present

## 2020-12-04 ENCOUNTER — Other Ambulatory Visit: Payer: Self-pay

## 2020-12-04 ENCOUNTER — Ambulatory Visit (HOSPITAL_COMMUNITY)
Admission: RE | Admit: 2020-12-04 | Discharge: 2020-12-04 | Disposition: A | Discharge status: Home / Self Care | Payer: PPO | Source: Ambulatory Visit | Attending: Neonatology | Admitting: Neonatology

## 2020-12-04 DIAGNOSIS — M7989 Other specified soft tissue disorders: Secondary | ICD-10-CM | POA: Diagnosis not present

## 2020-12-04 DIAGNOSIS — M79662 Pain in left lower leg: Secondary | ICD-10-CM | POA: Insufficient documentation

## 2020-12-04 NOTE — Progress Notes (Signed)
Lower extremity venous left study completed.   Please see CV Proc for preliminary results.   Arik Husmann, RDMS, RVT  

## 2020-12-05 DIAGNOSIS — J449 Chronic obstructive pulmonary disease, unspecified: Secondary | ICD-10-CM | POA: Diagnosis not present

## 2020-12-05 DIAGNOSIS — J439 Emphysema, unspecified: Secondary | ICD-10-CM | POA: Diagnosis not present

## 2021-01-05 DIAGNOSIS — J449 Chronic obstructive pulmonary disease, unspecified: Secondary | ICD-10-CM | POA: Diagnosis not present

## 2021-01-05 DIAGNOSIS — J439 Emphysema, unspecified: Secondary | ICD-10-CM | POA: Diagnosis not present

## 2021-01-09 DIAGNOSIS — H353132 Nonexudative age-related macular degeneration, bilateral, intermediate dry stage: Secondary | ICD-10-CM | POA: Diagnosis not present

## 2021-01-09 DIAGNOSIS — H5213 Myopia, bilateral: Secondary | ICD-10-CM | POA: Diagnosis not present

## 2021-02-04 DIAGNOSIS — J449 Chronic obstructive pulmonary disease, unspecified: Secondary | ICD-10-CM | POA: Diagnosis not present

## 2021-02-04 DIAGNOSIS — J439 Emphysema, unspecified: Secondary | ICD-10-CM | POA: Diagnosis not present

## 2021-03-07 DIAGNOSIS — J439 Emphysema, unspecified: Secondary | ICD-10-CM | POA: Diagnosis not present

## 2021-03-07 DIAGNOSIS — J449 Chronic obstructive pulmonary disease, unspecified: Secondary | ICD-10-CM | POA: Diagnosis not present

## 2021-03-12 ENCOUNTER — Emergency Department (HOSPITAL_BASED_OUTPATIENT_CLINIC_OR_DEPARTMENT_OTHER): Payer: PPO | Admitting: Radiology

## 2021-03-12 ENCOUNTER — Emergency Department (HOSPITAL_BASED_OUTPATIENT_CLINIC_OR_DEPARTMENT_OTHER): Payer: PPO

## 2021-03-12 ENCOUNTER — Other Ambulatory Visit: Payer: Self-pay

## 2021-03-12 ENCOUNTER — Encounter (HOSPITAL_BASED_OUTPATIENT_CLINIC_OR_DEPARTMENT_OTHER): Payer: Self-pay

## 2021-03-12 ENCOUNTER — Inpatient Hospital Stay (HOSPITAL_BASED_OUTPATIENT_CLINIC_OR_DEPARTMENT_OTHER)
Admission: EM | Admit: 2021-03-12 | Discharge: 2021-03-18 | DRG: 175 | Disposition: A | Discharge status: Skilled Nursing Facility | Payer: PPO | Attending: Internal Medicine | Admitting: Internal Medicine

## 2021-03-12 DIAGNOSIS — E871 Hypo-osmolality and hyponatremia: Secondary | ICD-10-CM | POA: Diagnosis not present

## 2021-03-12 DIAGNOSIS — Z9981 Dependence on supplemental oxygen: Secondary | ICD-10-CM

## 2021-03-12 DIAGNOSIS — M4854XA Collapsed vertebra, not elsewhere classified, thoracic region, initial encounter for fracture: Secondary | ICD-10-CM | POA: Diagnosis present

## 2021-03-12 DIAGNOSIS — I517 Cardiomegaly: Secondary | ICD-10-CM | POA: Diagnosis not present

## 2021-03-12 DIAGNOSIS — Z9049 Acquired absence of other specified parts of digestive tract: Secondary | ICD-10-CM

## 2021-03-12 DIAGNOSIS — G620 Drug-induced polyneuropathy: Secondary | ICD-10-CM | POA: Diagnosis present

## 2021-03-12 DIAGNOSIS — N179 Acute kidney failure, unspecified: Secondary | ICD-10-CM | POA: Diagnosis present

## 2021-03-12 DIAGNOSIS — Z803 Family history of malignant neoplasm of breast: Secondary | ICD-10-CM

## 2021-03-12 DIAGNOSIS — J9811 Atelectasis: Secondary | ICD-10-CM | POA: Diagnosis not present

## 2021-03-12 DIAGNOSIS — Z825 Family history of asthma and other chronic lower respiratory diseases: Secondary | ICD-10-CM

## 2021-03-12 DIAGNOSIS — R279 Unspecified lack of coordination: Secondary | ICD-10-CM | POA: Diagnosis not present

## 2021-03-12 DIAGNOSIS — S2242XA Multiple fractures of ribs, left side, initial encounter for closed fracture: Secondary | ICD-10-CM | POA: Diagnosis present

## 2021-03-12 DIAGNOSIS — Z20822 Contact with and (suspected) exposure to covid-19: Secondary | ICD-10-CM | POA: Diagnosis present

## 2021-03-12 DIAGNOSIS — E785 Hyperlipidemia, unspecified: Secondary | ICD-10-CM | POA: Diagnosis present

## 2021-03-12 DIAGNOSIS — Z7982 Long term (current) use of aspirin: Secondary | ICD-10-CM

## 2021-03-12 DIAGNOSIS — S2249XA Multiple fractures of ribs, unspecified side, initial encounter for closed fracture: Secondary | ICD-10-CM

## 2021-03-12 DIAGNOSIS — Z043 Encounter for examination and observation following other accident: Secondary | ICD-10-CM | POA: Diagnosis not present

## 2021-03-12 DIAGNOSIS — I10 Essential (primary) hypertension: Secondary | ICD-10-CM | POA: Diagnosis not present

## 2021-03-12 DIAGNOSIS — Z743 Need for continuous supervision: Secondary | ICD-10-CM | POA: Diagnosis not present

## 2021-03-12 DIAGNOSIS — I471 Supraventricular tachycardia: Secondary | ICD-10-CM | POA: Diagnosis not present

## 2021-03-12 DIAGNOSIS — W1830XA Fall on same level, unspecified, initial encounter: Secondary | ICD-10-CM | POA: Diagnosis present

## 2021-03-12 DIAGNOSIS — R0602 Shortness of breath: Secondary | ICD-10-CM | POA: Diagnosis not present

## 2021-03-12 DIAGNOSIS — R41841 Cognitive communication deficit: Secondary | ICD-10-CM | POA: Diagnosis not present

## 2021-03-12 DIAGNOSIS — E538 Deficiency of other specified B group vitamins: Secondary | ICD-10-CM | POA: Diagnosis present

## 2021-03-12 DIAGNOSIS — J439 Emphysema, unspecified: Secondary | ICD-10-CM | POA: Diagnosis present

## 2021-03-12 DIAGNOSIS — Z79899 Other long term (current) drug therapy: Secondary | ICD-10-CM

## 2021-03-12 DIAGNOSIS — Z880 Allergy status to penicillin: Secondary | ICD-10-CM

## 2021-03-12 DIAGNOSIS — Z90722 Acquired absence of ovaries, bilateral: Secondary | ICD-10-CM

## 2021-03-12 DIAGNOSIS — M4850XA Collapsed vertebra, not elsewhere classified, site unspecified, initial encounter for fracture: Secondary | ICD-10-CM | POA: Diagnosis present

## 2021-03-12 DIAGNOSIS — J9621 Acute and chronic respiratory failure with hypoxia: Secondary | ICD-10-CM | POA: Diagnosis present

## 2021-03-12 DIAGNOSIS — R0902 Hypoxemia: Secondary | ICD-10-CM | POA: Diagnosis present

## 2021-03-12 DIAGNOSIS — I2699 Other pulmonary embolism without acute cor pulmonale: Secondary | ICD-10-CM | POA: Diagnosis present

## 2021-03-12 DIAGNOSIS — J90 Pleural effusion, not elsewhere classified: Secondary | ICD-10-CM | POA: Diagnosis not present

## 2021-03-12 DIAGNOSIS — E876 Hypokalemia: Secondary | ICD-10-CM | POA: Diagnosis not present

## 2021-03-12 DIAGNOSIS — E039 Hypothyroidism, unspecified: Secondary | ICD-10-CM | POA: Diagnosis present

## 2021-03-12 DIAGNOSIS — F419 Anxiety disorder, unspecified: Secondary | ICD-10-CM | POA: Diagnosis present

## 2021-03-12 DIAGNOSIS — M797 Fibromyalgia: Secondary | ICD-10-CM | POA: Diagnosis present

## 2021-03-12 DIAGNOSIS — R296 Repeated falls: Secondary | ICD-10-CM | POA: Diagnosis present

## 2021-03-12 DIAGNOSIS — G8929 Other chronic pain: Secondary | ICD-10-CM | POA: Diagnosis present

## 2021-03-12 DIAGNOSIS — R1312 Dysphagia, oropharyngeal phase: Secondary | ICD-10-CM | POA: Diagnosis not present

## 2021-03-12 DIAGNOSIS — Z923 Personal history of irradiation: Secondary | ICD-10-CM

## 2021-03-12 DIAGNOSIS — I214 Non-ST elevation (NSTEMI) myocardial infarction: Secondary | ICD-10-CM

## 2021-03-12 DIAGNOSIS — R112 Nausea with vomiting, unspecified: Secondary | ICD-10-CM | POA: Diagnosis not present

## 2021-03-12 DIAGNOSIS — I110 Hypertensive heart disease with heart failure: Secondary | ICD-10-CM | POA: Diagnosis present

## 2021-03-12 DIAGNOSIS — Z6823 Body mass index (BMI) 23.0-23.9, adult: Secondary | ICD-10-CM

## 2021-03-12 DIAGNOSIS — Z87891 Personal history of nicotine dependence: Secondary | ICD-10-CM

## 2021-03-12 DIAGNOSIS — Z83438 Family history of other disorder of lipoprotein metabolism and other lipidemia: Secondary | ICD-10-CM

## 2021-03-12 DIAGNOSIS — I5032 Chronic diastolic (congestive) heart failure: Secondary | ICD-10-CM | POA: Diagnosis present

## 2021-03-12 DIAGNOSIS — S0990XA Unspecified injury of head, initial encounter: Secondary | ICD-10-CM | POA: Diagnosis not present

## 2021-03-12 DIAGNOSIS — J918 Pleural effusion in other conditions classified elsewhere: Secondary | ICD-10-CM | POA: Diagnosis not present

## 2021-03-12 DIAGNOSIS — G4733 Obstructive sleep apnea (adult) (pediatric): Secondary | ICD-10-CM | POA: Diagnosis present

## 2021-03-12 DIAGNOSIS — Z79890 Hormone replacement therapy (postmenopausal): Secondary | ICD-10-CM

## 2021-03-12 DIAGNOSIS — Z7951 Long term (current) use of inhaled steroids: Secondary | ICD-10-CM

## 2021-03-12 DIAGNOSIS — Z9079 Acquired absence of other genital organ(s): Secondary | ICD-10-CM

## 2021-03-12 DIAGNOSIS — Z853 Personal history of malignant neoplasm of breast: Secondary | ICD-10-CM

## 2021-03-12 DIAGNOSIS — F32A Depression, unspecified: Secondary | ICD-10-CM | POA: Diagnosis present

## 2021-03-12 DIAGNOSIS — S2249XD Multiple fractures of ribs, unspecified side, subsequent encounter for fracture with routine healing: Secondary | ICD-10-CM | POA: Diagnosis not present

## 2021-03-12 DIAGNOSIS — R2689 Other abnormalities of gait and mobility: Secondary | ICD-10-CM | POA: Diagnosis not present

## 2021-03-12 DIAGNOSIS — I2609 Other pulmonary embolism with acute cor pulmonale: Secondary | ICD-10-CM | POA: Diagnosis not present

## 2021-03-12 DIAGNOSIS — R262 Difficulty in walking, not elsewhere classified: Secondary | ICD-10-CM | POA: Diagnosis not present

## 2021-03-12 DIAGNOSIS — E119 Type 2 diabetes mellitus without complications: Secondary | ICD-10-CM

## 2021-03-12 DIAGNOSIS — J449 Chronic obstructive pulmonary disease, unspecified: Secondary | ICD-10-CM | POA: Diagnosis not present

## 2021-03-12 DIAGNOSIS — Z86718 Personal history of other venous thrombosis and embolism: Secondary | ICD-10-CM

## 2021-03-12 DIAGNOSIS — W19XXXA Unspecified fall, initial encounter: Secondary | ICD-10-CM | POA: Diagnosis not present

## 2021-03-12 DIAGNOSIS — Z91048 Other nonmedicinal substance allergy status: Secondary | ICD-10-CM

## 2021-03-12 DIAGNOSIS — M549 Dorsalgia, unspecified: Secondary | ICD-10-CM | POA: Diagnosis present

## 2021-03-12 DIAGNOSIS — E43 Unspecified severe protein-calorie malnutrition: Secondary | ICD-10-CM | POA: Diagnosis present

## 2021-03-12 DIAGNOSIS — Z885 Allergy status to narcotic agent status: Secondary | ICD-10-CM

## 2021-03-12 DIAGNOSIS — Z66 Do not resuscitate: Secondary | ICD-10-CM | POA: Diagnosis present

## 2021-03-12 DIAGNOSIS — Z961 Presence of intraocular lens: Secondary | ICD-10-CM | POA: Diagnosis present

## 2021-03-12 DIAGNOSIS — S2242XD Multiple fractures of ribs, left side, subsequent encounter for fracture with routine healing: Secondary | ICD-10-CM | POA: Diagnosis not present

## 2021-03-12 DIAGNOSIS — I872 Venous insufficiency (chronic) (peripheral): Secondary | ICD-10-CM | POA: Diagnosis present

## 2021-03-12 DIAGNOSIS — Z86711 Personal history of pulmonary embolism: Secondary | ICD-10-CM

## 2021-03-12 DIAGNOSIS — Z888 Allergy status to other drugs, medicaments and biological substances status: Secondary | ICD-10-CM

## 2021-03-12 DIAGNOSIS — T451X5A Adverse effect of antineoplastic and immunosuppressive drugs, initial encounter: Secondary | ICD-10-CM | POA: Diagnosis present

## 2021-03-12 DIAGNOSIS — R531 Weakness: Secondary | ICD-10-CM | POA: Diagnosis not present

## 2021-03-12 DIAGNOSIS — Z90710 Acquired absence of both cervix and uterus: Secondary | ICD-10-CM

## 2021-03-12 DIAGNOSIS — M5030 Other cervical disc degeneration, unspecified cervical region: Secondary | ICD-10-CM | POA: Diagnosis not present

## 2021-03-12 DIAGNOSIS — R079 Chest pain, unspecified: Secondary | ICD-10-CM | POA: Diagnosis not present

## 2021-03-12 DIAGNOSIS — M6281 Muscle weakness (generalized): Secondary | ICD-10-CM | POA: Diagnosis not present

## 2021-03-12 DIAGNOSIS — Z881 Allergy status to other antibiotic agents status: Secondary | ICD-10-CM

## 2021-03-12 DIAGNOSIS — S22039A Unspecified fracture of third thoracic vertebra, initial encounter for closed fracture: Secondary | ICD-10-CM | POA: Diagnosis not present

## 2021-03-12 LAB — CBC WITH DIFFERENTIAL/PLATELET
Abs Immature Granulocytes: 0.06 10*3/uL (ref 0.00–0.07)
Basophils Absolute: 0 10*3/uL (ref 0.0–0.1)
Basophils Relative: 0 %
Eosinophils Absolute: 0 10*3/uL (ref 0.0–0.5)
Eosinophils Relative: 0 %
HCT: 45.5 % (ref 36.0–46.0)
Hemoglobin: 14.3 g/dL (ref 12.0–15.0)
Immature Granulocytes: 0 %
Lymphocytes Relative: 7 %
Lymphs Abs: 1 10*3/uL (ref 0.7–4.0)
MCH: 27.7 pg (ref 26.0–34.0)
MCHC: 31.4 g/dL (ref 30.0–36.0)
MCV: 88 fL (ref 80.0–100.0)
Monocytes Absolute: 1.1 10*3/uL — ABNORMAL HIGH (ref 0.1–1.0)
Monocytes Relative: 7 %
Neutro Abs: 13.5 10*3/uL — ABNORMAL HIGH (ref 1.7–7.7)
Neutrophils Relative %: 86 %
Platelets: 441 10*3/uL — ABNORMAL HIGH (ref 150–400)
RBC: 5.17 MIL/uL — ABNORMAL HIGH (ref 3.87–5.11)
RDW: 18 % — ABNORMAL HIGH (ref 11.5–15.5)
WBC: 15.7 10*3/uL — ABNORMAL HIGH (ref 4.0–10.5)
nRBC: 0 % (ref 0.0–0.2)

## 2021-03-12 LAB — URINALYSIS, ROUTINE W REFLEX MICROSCOPIC
Glucose, UA: NEGATIVE mg/dL
Ketones, ur: 40 mg/dL — AB
Leukocytes,Ua: NEGATIVE
Nitrite: NEGATIVE
Protein, ur: 30 mg/dL — AB
Specific Gravity, Urine: 1.029 (ref 1.005–1.030)
pH: 5.5 (ref 5.0–8.0)

## 2021-03-12 LAB — I-STAT VENOUS BLOOD GAS, ED
Acid-Base Excess: 4 mmol/L — ABNORMAL HIGH (ref 0.0–2.0)
Bicarbonate: 27.5 mmol/L (ref 20.0–28.0)
Calcium, Ion: 1.12 mmol/L — ABNORMAL LOW (ref 1.15–1.40)
HCT: 45 % (ref 36.0–46.0)
Hemoglobin: 15.3 g/dL — ABNORMAL HIGH (ref 12.0–15.0)
O2 Saturation: 79 %
Patient temperature: 98.6
Potassium: 3.7 mmol/L (ref 3.5–5.1)
Sodium: 135 mmol/L (ref 135–145)
TCO2: 29 mmol/L (ref 22–32)
pCO2, Ven: 35.2 mmHg — ABNORMAL LOW (ref 44.0–60.0)
pH, Ven: 7.501 — ABNORMAL HIGH (ref 7.250–7.430)
pO2, Ven: 39 mmHg (ref 32.0–45.0)

## 2021-03-12 LAB — COMPREHENSIVE METABOLIC PANEL
ALT: 5 U/L (ref 0–44)
AST: 14 U/L — ABNORMAL LOW (ref 15–41)
Albumin: 3.1 g/dL — ABNORMAL LOW (ref 3.5–5.0)
Alkaline Phosphatase: 68 U/L (ref 38–126)
Anion gap: 13 (ref 5–15)
BUN: 25 mg/dL — ABNORMAL HIGH (ref 8–23)
CO2: 26 mmol/L (ref 22–32)
Calcium: 8.7 mg/dL — ABNORMAL LOW (ref 8.9–10.3)
Chloride: 96 mmol/L — ABNORMAL LOW (ref 98–111)
Creatinine, Ser: 0.81 mg/dL (ref 0.44–1.00)
GFR, Estimated: >60 mL/min (ref >60)
Glucose, Bld: 105 mg/dL — ABNORMAL HIGH (ref 70–99)
Potassium: 3.9 mmol/L (ref 3.5–5.1)
Sodium: 135 mmol/L (ref 135–145)
Total Bilirubin: 2 mg/dL — ABNORMAL HIGH (ref 0.3–1.2)
Total Protein: 7.1 g/dL (ref 6.5–8.1)

## 2021-03-12 LAB — RESP PANEL BY RT-PCR (FLU A&B, COVID) ARPGX2
Influenza A by PCR: NEGATIVE
Influenza B by PCR: NEGATIVE
SARS Coronavirus 2 by RT PCR: NEGATIVE

## 2021-03-12 LAB — BRAIN NATRIURETIC PEPTIDE: B Natriuretic Peptide: 746 pg/mL — ABNORMAL HIGH (ref 0.0–100.0)

## 2021-03-12 LAB — TROPONIN I (HIGH SENSITIVITY)
Troponin I (High Sensitivity): 30 ng/L — ABNORMAL HIGH (ref <18)
Troponin I (High Sensitivity): 37 ng/L — ABNORMAL HIGH (ref <18)

## 2021-03-12 MED ORDER — ASPIRIN 325 MG PO TABS
325.0000 mg | ORAL_TABLET | Freq: Every day | ORAL | Status: DC
  Administered 2021-03-12: 325 mg via ORAL

## 2021-03-12 MED ORDER — IOHEXOL 350 MG/ML SOLN
80.0000 mL | Freq: Once | INTRAVENOUS | Status: AC | PRN
  Administered 2021-03-12: 80 mL via INTRAVENOUS

## 2021-03-12 MED ORDER — KETOROLAC TROMETHAMINE 30 MG/ML IJ SOLN
30.0000 mg | Freq: Once | INTRAMUSCULAR | Status: AC
  Administered 2021-03-12: 30 mg via INTRAVENOUS
  Filled 2021-03-12: qty 1

## 2021-03-12 MED ORDER — ACETAMINOPHEN 325 MG PO TABS
650.0000 mg | ORAL_TABLET | Freq: Once | ORAL | Status: AC
  Administered 2021-03-12: 650 mg via ORAL
  Filled 2021-03-12: qty 2

## 2021-03-12 MED ORDER — ASPIRIN 325 MG PO TABS
325.0000 mg | ORAL_TABLET | Freq: Every day | ORAL | Status: DC
  Filled 2021-03-12: qty 1

## 2021-03-12 MED ORDER — LIDOCAINE 5 % EX PTCH
1.0000 | MEDICATED_PATCH | CUTANEOUS | Status: DC
  Administered 2021-03-12 – 2021-03-17 (×6): 1 via TRANSDERMAL
  Filled 2021-03-12 (×6): qty 1

## 2021-03-12 NOTE — ED Notes (Signed)
Pt educated on the benefits of sitting up in bed in order to ease her breathing and increase her O2 saturation. Pt declining repositioning at this time.

## 2021-03-12 NOTE — ED Triage Notes (Signed)
Patient here POV from Home.  Patient presents for Fall. Patient fell Tuesday on her Left Side after falling tripping over Oxygen. Patient complaining of Pain to Left Torso. Possible Head Injury. No History of Blood-Thinning Medications.  Son states Fall may have been also due to Left Leg which has been bothering her for approximately 1.5 months.  NAD Noted during Triage. A&Ox4. GCS 15. BIB Wheelchair.

## 2021-03-12 NOTE — ED Provider Notes (Signed)
°  Provider Note MRN:  [id number]  Arrival date & time: 03/13/21    ED Course and Medical Decision Making  Assumed care from Dr. Pearline Cables at shift change.  Fall, rib fractures, increased oxygen requirement will need admission, awaiting CT.  CT scan reveals small PE, rib fractures, diffuse emphysema.  Case discussed with Dr. Redmond Pulling of trauma surgery, who recommends hospitalist admission.  Trauma will follow in consultation.  Accepted for admission by the hospitalist service.  On my assessment patient is sitting comfortably 6 L nasal cannula.  .Critical Care Performed by: Maudie Flakes, MD Authorized by: Maudie Flakes, MD   Critical care provider statement:    Critical care time (minutes):  35   Critical care was necessary to treat or prevent imminent or life-threatening deterioration of the following conditions:  Respiratory failure   Critical care was time spent personally by me on the following activities:  Development of treatment plan with patient or surrogate, discussions with consultants, evaluation of patient's response to treatment, examination of patient, ordering and review of laboratory studies, ordering and review of radiographic studies, ordering and performing treatments and interventions, pulse oximetry, re-evaluation of patient's condition and review of old charts  Final Clinical Impressions(s) / ED Diagnoses     ICD-10-CM   1. NSTEMI (non-ST elevated myocardial infarction) ([place])  I21.4     2. Hypoxia  R09.02     3. Closed fracture of multiple ribs of left side, initial encounter  S22.42XA     4. Fall, initial encounter  W19.[facility]       ED Discharge Orders     None       Discharge Instructions   None     Barth Kirks. [place] Small, [place] [email or web address]    Maudie Flakes, MD 03/13/21 [phone]

## 2021-03-12 NOTE — ED Notes (Signed)
Dr. Pearline Cables made aware of O2 saturation. O2 increased to 6L [place]

## 2021-03-12 NOTE — ED Notes (Signed)
Patient transported to CT 

## 2021-03-12 NOTE — ED Provider Notes (Signed)
[facility] EMERGENCY DEPT Provider Note   CSN: [id number] Arrival date & time: 03/12/21  1917     History  Chief Complaint  Patient presents with   [name] is a 87 y.o. female.  This is a 87 y.o. female with significant medical history as below, including DVT, hypothyroidism, recurrent falls, home oxygen use who presents to the ED with complaint of fall, dyspnea.  Patient reports multiple falls over the past 2 weeks.  She typically ambulates with a walker but occasionally does not like to use her walker.  Patient had a fall few days ago while ambulating from the kitchen to the living room.  She fell onto her left side, striking her left chest on the floor, right separate face.  Patient also fell again earlier today from her couch, rolled off of her couch accidentally.  No LOC.  No thinners.  Patient has been ambulatory since these falls.  She has been having some worsening left-sided chest pain after the initial fall making difficult for her to take a deep inspiration.  Has been using her home oxygen, 4 L at baseline.  She feels as though her breathing is roughly at her baseline when she is at rest but when she exerts herself she becomes very short of breath.  No coughing.  No fevers or chills.  No nausea or vomiting.  She has some swelling to her legs which is chronic, she has been scratching her left lower extremity and pulling off pieces of skin over the past few weeks per the son at bedside.  No fevers or chills.  No recent medication or diet changes.  Family also reports poor oral intake over the past few days.  The history is provided by the patient and a relative. No language interpreter was used.  Fall Associated symptoms include chest pain and shortness of breath. Pertinent negatives include no abdominal pain and no headaches.  Patient Active Problem List   Diagnosis Date Noted   Sepsis ([place]) 04/10/2014   History of DVT (deep vein thrombosis)     Blood poisoning    Chronic venous insufficiency 02/15/2014   Paroxysmal SVT (supraventricular tachycardia) ([place]) 09/11/2013   Heart palpitations 08/22/2013   H. pylori infection 05/04/2013   DVT, LLE 04/18/2013   Depression 04/18/2013   Hypothyroidism 04/18/2013   UTI (urinary tract infection) 04/10/2013   Hyponatremia 04/10/2013   Hypokalemia 04/10/2013   Leg edema, left 04/10/2013   Nausea with vomiting 04/10/2013   Adult failure to thrive 04/10/2013   Protein-calorie malnutrition, severe ([place]) 04/10/2013   Renal insufficiency 03/10/2013   GI bleed 03/10/2013   UGIB (upper gastrointestinal bleed) 03/10/2013   Breast cancer of upper-outer quadrant of right female breast ([place]) 01/04/2013   Neoplasm of left breast, primary tumor staging category Tis: ductal carcinoma in situ (DCIS) 01/04/2013   Vertebral compression fracture ([place]) 10/30/2012   Adrenal insufficiency ([place]) 10/30/2012   Osteoporosis 10/30/2012   Irritable bowel syndrome 10/30/2012   Peripheral neuropathy 10/30/2012   Unsteady gait 10/30/2012   Recurrent falls 10/30/2012   Chronic cough 07/18/2012   Snoring 06/14/2012   Thin skin    Ecchymosis    Seasonal allergies    Frequency of urination    Nocturia    Anemia    Seizures ([place])    Unspecified hereditary and idiopathic peripheral neuropathy    Acute blood loss anemia 01/02/2011   Low serum cortisol level 01/02/2011   HLD (hyperlipidemia) 01/23/2010  Obstructive sleep apnea 01/23/2010   MASS, LUNG 01/23/2010      Home Medications Prior to Admission medications   Medication Sig Start Date End Date Taking? Authorizing Provider  acetaminophen (TYLENOL) 500 MG tablet Take 2 tablets (1,000 mg total) by mouth every 6 (six) hours as needed for mild pain or moderate pain. 05/10/16   Leo Grosser, MD  albuterol (ACCUNEB) 1.25 MG/3ML nebulizer solution Take 1 ampule by nebulization every 6 (six) hours as needed for wheezing.    [provider]  albuterol  (PROVENTIL HFA;VENTOLIN HFA) 108 (90 Base) MCG/ACT inhaler Inhale 2 puffs into the lungs every 6 (six) hours as needed for wheezing or shortness of breath. 03/25/17   Collene Gobble, MD  aspirin EC 81 MG tablet Take 81 mg by mouth daily.    [provider]  benzonatate (TESSALON) 100 MG capsule Take 100 mg by mouth 3 (three) times daily as needed for cough.    [provider]  DULoxetine (CYMBALTA) 60 MG capsule Take 60 mg by mouth daily after lunch.     [provider]  ergocalciferol (VITAMIN D2) 50000 UNITS capsule Take 50,000 Units by mouth once a week. Every Tuesday.    [provider]  ezetimibe (ZETIA) 10 MG tablet Take 1 tablet by mouth daily. 03/13/18   [provider]  fluticasone (FLONASE) 50 MCG/ACT nasal spray Place 2 sprays into both nostrils daily. 02/11/17   Collene Gobble, MD  Fluticasone-Salmeterol (ADVAIR DISKUS) 250-50 MCG/DOSE AEPB Inhale 1 puff into the lungs 2 (two) times daily. (PATIENT WAS GIVEN SAMPLES-ALTERNATES WITH SYMBICORT SAMPLES)    [provider]  gabapentin (NEURONTIN) 100 MG capsule Take 100 mg by mouth 3 (three) times daily.     [provider]  levothyroxine (SYNTHROID, LEVOTHROID) 88 MCG tablet Take 88 mcg by mouth daily before breakfast. For hypothyroid    [provider]  loratadine (CLARITIN) 10 MG tablet Take 1 tablet (10 mg total) by mouth daily. 02/11/17   Collene Gobble, MD  LORazepam (ATIVAN) 0.5 MG tablet Take 0.5 mg by mouth every 8 (eight) hours as needed for anxiety. Anxiety 07/04/12   Vivien Rota, NP  pantoprazole (PROTONIX) 40 MG tablet Take 1 tablet (40 mg total) by mouth daily. 02/11/17   Collene Gobble, MD  simvastatin (ZOCOR) 80 MG tablet Take 1 tablet by mouth daily. 03/26/18   [provider]      Allergies    Penicillins, Adhesive [tape], Doxycycline, and Morphine and related    Review of Systems   Review of Systems  Constitutional:  Negative for  chills and fever.  HENT:  Negative for facial swelling and trouble swallowing.   Eyes:  Negative for photophobia and visual disturbance.  Respiratory:  Positive for chest tightness and shortness of breath. Negative for cough.   Cardiovascular:  Positive for chest pain. Negative for palpitations.  Gastrointestinal:  Negative for abdominal pain, nausea and vomiting.  Endocrine: Negative for polydipsia and polyuria.  Genitourinary:  Negative for difficulty urinating and hematuria.  Musculoskeletal:  Positive for arthralgias. Negative for gait problem and joint swelling.  Skin:  Negative for pallor and rash.  Neurological:  Negative for syncope and headaches.  Psychiatric/Behavioral:  Negative for agitation and confusion.    Physical Exam Updated Vital Signs BP 124/74    Pulse 86    Temp 97.8 F (36.6 C)    Resp (!) 26    Ht 5\' 3"  (1.6 m)  Wt 59.4 kg    SpO2 91%    BMI 23.20 kg/m  Physical Exam Vitals and nursing note reviewed.  Constitutional:      General: She is not in acute distress.    Appearance: Normal appearance.  HENT:     Head: Normocephalic. No raccoon eyes, Battle's sign, right periorbital erythema or left periorbital erythema.      Right Ear: External ear normal.     Left Ear: External ear normal.     Nose: Nose normal.     Mouth/Throat:     Mouth: Mucous membranes are moist.  Eyes:     General: No scleral icterus.       Right eye: No discharge.        Left eye: No discharge.  Cardiovascular:     Rate and Rhythm: Normal rate and regular rhythm.     Pulses: Normal pulses.     Heart sounds: Normal heart sounds.  Pulmonary:     Effort: Tachypnea and retractions present. No respiratory distress.     Breath sounds: Decreased air movement present. Decreased breath sounds present.  Chest:    Abdominal:     General: Abdomen is flat.     Tenderness: There is no abdominal tenderness.  Musculoskeletal:        General: Normal range of motion.     Cervical back: Normal  range of motion.     Right lower leg: No edema.     Left lower leg: No edema.  Skin:    General: Skin is warm and dry.     Capillary Refill: Capillary refill takes less than 2 seconds.          Comments: Lower extremities neurovascular intact bilateral.  Neurological:     Mental Status: She is alert.  Psychiatric:        Mood and Affect: Mood normal.        Behavior: Behavior normal.    ED Results / Procedures / Treatments   Labs (all labs ordered are listed, but only abnormal results are displayed) Labs Reviewed  CBC WITH DIFFERENTIAL/PLATELET - Abnormal; Notable for the following components:      Result Value   WBC 15.7 (*)    RBC 5.17 (*)    RDW 18.0 (*)    Platelets 441 (*)    Neutro Abs 13.5 (*)    Monocytes Absolute 1.1 (*)    All other components within normal limits  COMPREHENSIVE METABOLIC PANEL - Abnormal; Notable for the following components:   Chloride 96 (*)    Glucose, Bld 105 (*)    BUN 25 (*)    Calcium 8.7 (*)    Albumin 3.1 (*)    AST 14 (*)    Total Bilirubin 2.0 (*)    All other components within normal limits  BRAIN NATRIURETIC PEPTIDE - Abnormal; Notable for the following components:   B Natriuretic Peptide 746.0 (*)    All other components within normal limits  URINALYSIS, ROUTINE W REFLEX MICROSCOPIC - Abnormal; Notable for the following components:   Color, Urine ORANGE (*)    Hgb urine dipstick TRACE (*)    Bilirubin Urine SMALL (*)    Ketones, ur 40 (*)    Protein, ur 30 (*)    Bacteria, UA RARE (*)    All other components within normal limits  I-STAT VENOUS BLOOD GAS, ED - Abnormal; Notable for the following components:   pH, Ven 7.501 (*)    pCO2, Ven 35.2 (*)  Acid-Base Excess 4.0 (*)    Calcium, Ion 1.12 (*)    Hemoglobin 15.3 (*)    All other components within normal limits  TROPONIN I (HIGH SENSITIVITY) - Abnormal; Notable for the following components:   Troponin I (High Sensitivity) 30 (*)    All other components within  normal limits  RESP PANEL BY RT-PCR (FLU A&B, COVID) ARPGX2  TROPONIN I (HIGH SENSITIVITY)    EKG None  Radiology DG Ribs Unilateral W/Chest Left  Result Date: 03/12/2021 CLINICAL DATA:  Fall with left tarsal pain EXAM: LEFT RIBS AND CHEST - 3+ VIEW COMPARISON:  Chest CT 04/01/2015, chest x-ray 04/17/2014 FINDINGS: Single view chest demonstrates diffuse coarse reticular opacity consistent with emphysema and chronic interstitial lung disease, findings appear worse as compared with prior radiograph. Possible small left-sided pleural effusion. Moderate hiatal hernia. Clips from in the left axilla and at the chest wall. Left rib series demonstrates acute left fourth fifth and sixth rib fractures. IMPRESSION: 1. Acute left fourth through sixth rib fractures. No visible pneumothorax. 2. Diffuse coarse interstitial reticular opacity consistent with emphysema and probable chronic lung disease though difficult to exclude acute superimposed interstitial process. Small left effusion. 3. Hiatal hernia Electronically Signed   By: Donavan Foil M.D.   On: 03/12/2021 20:18   CT Head Wo Contrast  Result Date: 03/12/2021 CLINICAL DATA:  Head trauma, minor (Age >= 65y).  Fall. EXAM: CT HEAD WITHOUT CONTRAST TECHNIQUE: Contiguous axial images were obtained from the base of the skull through the vertex without intravenous contrast. RADIATION DOSE REDUCTION: This exam was performed according to the departmental dose-optimization program which includes automated exposure control, adjustment of the mA and/or kV according to patient size and/or use of iterative reconstruction technique. COMPARISON:  05/10/2016 FINDINGS: Brain: Mild age related volume loss. No acute intracranial abnormality. Specifically, no hemorrhage, hydrocephalus, mass lesion, acute infarction, or significant intracranial injury. Vascular: No hyperdense vessel or unexpected calcification. Skull: No acute calvarial abnormality. Sinuses/Orbits: No acute  findings Other: None IMPRESSION: No acute intracranial abnormality. Electronically Signed   By: Rolm Baptise M.D.   On: 03/12/2021 21:27   CT Cervical Spine Wo Contrast  Result Date: 03/12/2021 CLINICAL DATA:  fall w/ head injury EXAM: CT CERVICAL SPINE WITHOUT CONTRAST TECHNIQUE: Multidetector CT imaging of the cervical spine was performed without intravenous contrast. Multiplanar CT image reconstructions were also generated. RADIATION DOSE REDUCTION: This exam was performed according to the departmental dose-optimization program which includes automated exposure control, adjustment of the mA and/or kV according to patient size and/or use of iterative reconstruction technique. COMPARISON:  None. FINDINGS: Alignment: No subluxation. Skull base and vertebrae: No acute fracture. No primary bone lesion or focal pathologic process. Soft tissues and spinal canal: No prevertebral fluid or swelling. No visible canal hematoma. Disc levels:  Diffuse degenerative disc disease and facet disease. Upper chest: Cystic spaces within the upper lobes, progressing since prior study, presumably progressive bronchiectasis and emphysema. No acute findings. Other: None IMPRESSION: Diffuse degenerative disc and facet disease. No acute bony abnormality. Electronically Signed   By: Rolm Baptise M.D.   On: 03/12/2021 21:29    Procedures .Critical Care Performed by: Jeanell Sparrow, DO Authorized by: Jeanell Sparrow, DO   Critical care provider statement:    Critical care time (minutes):  48   Critical care time was exclusive of:  Separately billable procedures and treating other patients   Critical care was necessary to treat or prevent imminent or life-threatening deterioration of the following conditions:  Respiratory failure   Critical care was time spent personally by me on the following activities:  Development of treatment plan with patient or surrogate, discussions with consultants, evaluation of patient's response to  treatment, examination of patient, ordering and review of laboratory studies, ordering and review of radiographic studies, ordering and performing treatments and interventions, pulse oximetry, re-evaluation of patient's condition and review of old charts    Cardiac monitoring reviewed by myself demonstrates normal sinus rhythm.  Medications Ordered in ED Medications  lidocaine (LIDODERM) 5 % 1 patch (1 patch Transdermal Patch Applied 03/12/21 2134)  aspirin tablet 325 mg (325 mg Oral Given 03/12/21 2247)  iohexol (OMNIPAQUE) 350 MG/ML injection 80 mL (has no administration in time range)  acetaminophen (TYLENOL) tablet 650 mg (650 mg Oral Given 03/12/21 2040)  ketorolac (TORADOL) 30 MG/ML injection 30 mg (30 mg Intravenous Given 03/12/21 2248)    ED Course/ Medical Decision Making/ A&P                           Medical Decision Making   CC: Fall, chest pain  This patient complains of above; this involves an extensive number of treatment options and is a complaint that carries with it a high risk of complications and morbidity. Vital signs were reviewed. Serious etiologies considered.  Record review:  Previous records obtained and reviewed   Additional history obtained from son at bedside  Work up as above, notable for:  Labs & imaging results that were available during my care of the patient were reviewed by me and considered in my medical decision making.   I ordered imaging studies which included rib series left   CT head, cervical spine.  CT PE.  And I independently visualized and interpreted imaging which showed left fourth through sixth rib fractures.  CT head, cervical spine was nonacute.  Management: Patient given analgesics, incentive spirometer.  Patient with ongoing pain to her left side chest wall.  She will desaturate on her 4 L nasal cannula.  Nasal cannula increased to 6 L.  Given further medications for her chest wall discomfort.  Troponin was obtained and does show  elevation.  EKG without evidence of acute ischemia. She has chest pain but also multiple rib fx's. ASA was given.   Given patient's hypoxia, chest wall discomfort appears concerning for demand ischemia secondary to hypoxia. BNP is also acutely elevated, she has 2D echo from 5 yrs ago with EF 55-60%. Concern today also for worsening CHF.   Patient with history of DVT, lung mass, hypoxia, tachypneic.  Will obtain CT PE.   CT PE is pending at this time.  Patient with acute respiratory failure with hypoxia.  Demand ischemia/NSTEMI.  She was given aspirin.  She has required increased oxygen from her baseline.  Recommend admission. Care signed out to incoming EDP at this time pending CT and admission.     This chart was dictated using voice recognition software.  Despite best efforts to proofread,  errors can occur which can change the documentation meaning. Final Clinical Impression(s) / ED Diagnoses Final diagnoses:  NSTEMI (non-ST elevated myocardial infarction) ([place])  Hypoxia  Closed fracture of multiple ribs of left side, initial encounter  Fall, initial encounter    Rx / DC Orders ED Discharge Orders     None         Jeanell Sparrow, DO 03/12/21 2316

## 2021-03-13 ENCOUNTER — Encounter (HOSPITAL_COMMUNITY): Payer: Self-pay | Admitting: Internal Medicine

## 2021-03-13 DIAGNOSIS — R0602 Shortness of breath: Secondary | ICD-10-CM | POA: Diagnosis not present

## 2021-03-13 DIAGNOSIS — M4854XA Collapsed vertebra, not elsewhere classified, thoracic region, initial encounter for fracture: Secondary | ICD-10-CM | POA: Diagnosis present

## 2021-03-13 DIAGNOSIS — M549 Dorsalgia, unspecified: Secondary | ICD-10-CM | POA: Diagnosis present

## 2021-03-13 DIAGNOSIS — J449 Chronic obstructive pulmonary disease, unspecified: Secondary | ICD-10-CM | POA: Diagnosis not present

## 2021-03-13 DIAGNOSIS — E039 Hypothyroidism, unspecified: Secondary | ICD-10-CM | POA: Diagnosis present

## 2021-03-13 DIAGNOSIS — I110 Hypertensive heart disease with heart failure: Secondary | ICD-10-CM | POA: Diagnosis present

## 2021-03-13 DIAGNOSIS — Z66 Do not resuscitate: Secondary | ICD-10-CM | POA: Diagnosis present

## 2021-03-13 DIAGNOSIS — J9621 Acute and chronic respiratory failure with hypoxia: Secondary | ICD-10-CM | POA: Diagnosis not present

## 2021-03-13 DIAGNOSIS — Z9981 Dependence on supplemental oxygen: Secondary | ICD-10-CM | POA: Diagnosis not present

## 2021-03-13 DIAGNOSIS — R112 Nausea with vomiting, unspecified: Secondary | ICD-10-CM | POA: Diagnosis not present

## 2021-03-13 DIAGNOSIS — E43 Unspecified severe protein-calorie malnutrition: Secondary | ICD-10-CM | POA: Diagnosis present

## 2021-03-13 DIAGNOSIS — G620 Drug-induced polyneuropathy: Secondary | ICD-10-CM | POA: Diagnosis present

## 2021-03-13 DIAGNOSIS — S22039A Unspecified fracture of third thoracic vertebra, initial encounter for closed fracture: Secondary | ICD-10-CM | POA: Diagnosis not present

## 2021-03-13 DIAGNOSIS — R279 Unspecified lack of coordination: Secondary | ICD-10-CM | POA: Diagnosis not present

## 2021-03-13 DIAGNOSIS — E119 Type 2 diabetes mellitus without complications: Secondary | ICD-10-CM

## 2021-03-13 DIAGNOSIS — S2249XA Multiple fractures of ribs, unspecified side, initial encounter for closed fracture: Secondary | ICD-10-CM | POA: Diagnosis present

## 2021-03-13 DIAGNOSIS — F32A Depression, unspecified: Secondary | ICD-10-CM | POA: Diagnosis present

## 2021-03-13 DIAGNOSIS — E785 Hyperlipidemia, unspecified: Secondary | ICD-10-CM | POA: Diagnosis present

## 2021-03-13 DIAGNOSIS — W19XXXA Unspecified fall, initial encounter: Secondary | ICD-10-CM | POA: Diagnosis not present

## 2021-03-13 DIAGNOSIS — N179 Acute kidney failure, unspecified: Secondary | ICD-10-CM | POA: Diagnosis present

## 2021-03-13 DIAGNOSIS — I471 Supraventricular tachycardia: Secondary | ICD-10-CM | POA: Diagnosis not present

## 2021-03-13 DIAGNOSIS — G8929 Other chronic pain: Secondary | ICD-10-CM | POA: Diagnosis present

## 2021-03-13 DIAGNOSIS — F419 Anxiety disorder, unspecified: Secondary | ICD-10-CM | POA: Diagnosis present

## 2021-03-13 DIAGNOSIS — S2242XD Multiple fractures of ribs, left side, subsequent encounter for fracture with routine healing: Secondary | ICD-10-CM | POA: Diagnosis not present

## 2021-03-13 DIAGNOSIS — J439 Emphysema, unspecified: Secondary | ICD-10-CM | POA: Diagnosis present

## 2021-03-13 DIAGNOSIS — E871 Hypo-osmolality and hyponatremia: Secondary | ICD-10-CM | POA: Diagnosis not present

## 2021-03-13 DIAGNOSIS — I5032 Chronic diastolic (congestive) heart failure: Secondary | ICD-10-CM | POA: Diagnosis present

## 2021-03-13 DIAGNOSIS — S2242XA Multiple fractures of ribs, left side, initial encounter for closed fracture: Secondary | ICD-10-CM | POA: Diagnosis not present

## 2021-03-13 DIAGNOSIS — R079 Chest pain, unspecified: Secondary | ICD-10-CM | POA: Diagnosis not present

## 2021-03-13 DIAGNOSIS — Z743 Need for continuous supervision: Secondary | ICD-10-CM | POA: Diagnosis not present

## 2021-03-13 DIAGNOSIS — E876 Hypokalemia: Secondary | ICD-10-CM | POA: Diagnosis not present

## 2021-03-13 DIAGNOSIS — I2699 Other pulmonary embolism without acute cor pulmonale: Secondary | ICD-10-CM | POA: Diagnosis present

## 2021-03-13 DIAGNOSIS — R531 Weakness: Secondary | ICD-10-CM | POA: Diagnosis not present

## 2021-03-13 DIAGNOSIS — R0902 Hypoxemia: Secondary | ICD-10-CM | POA: Diagnosis present

## 2021-03-13 DIAGNOSIS — W1830XA Fall on same level, unspecified, initial encounter: Secondary | ICD-10-CM | POA: Diagnosis present

## 2021-03-13 DIAGNOSIS — J90 Pleural effusion, not elsewhere classified: Secondary | ICD-10-CM | POA: Diagnosis not present

## 2021-03-13 DIAGNOSIS — I10 Essential (primary) hypertension: Secondary | ICD-10-CM | POA: Diagnosis not present

## 2021-03-13 DIAGNOSIS — J918 Pleural effusion in other conditions classified elsewhere: Secondary | ICD-10-CM | POA: Diagnosis not present

## 2021-03-13 DIAGNOSIS — Z20822 Contact with and (suspected) exposure to covid-19: Secondary | ICD-10-CM | POA: Diagnosis present

## 2021-03-13 LAB — CBC
HCT: 44.8 % (ref 36.0–46.0)
Hemoglobin: 14.5 g/dL (ref 12.0–15.0)
MCH: 28.5 pg (ref 26.0–34.0)
MCHC: 32.4 g/dL (ref 30.0–36.0)
MCV: 88 fL (ref 80.0–100.0)
Platelets: 392 10*3/uL (ref 150–400)
RBC: 5.09 MIL/uL (ref 3.87–5.11)
RDW: 17.5 % — ABNORMAL HIGH (ref 11.5–15.5)
WBC: 14.5 10*3/uL — ABNORMAL HIGH (ref 4.0–10.5)
nRBC: 0 % (ref 0.0–0.2)

## 2021-03-13 LAB — HEPARIN LEVEL (UNFRACTIONATED): Heparin Unfractionated: <0.1 IU/mL — ABNORMAL LOW (ref 0.30–0.70)

## 2021-03-13 MED ORDER — ONDANSETRON HCL 4 MG/2ML IJ SOLN
4.0000 mg | Freq: Four times a day (QID) | INTRAMUSCULAR | Status: DC | PRN
  Administered 2021-03-14 – 2021-03-17 (×4): 4 mg via INTRAVENOUS
  Filled 2021-03-13 (×4): qty 2

## 2021-03-13 MED ORDER — HYDRALAZINE HCL 20 MG/ML IJ SOLN: 5.0000 mg | INTRAMUSCULAR | Status: DC | PRN

## 2021-03-13 MED ORDER — SODIUM CHLORIDE 0.9% FLUSH
3.0000 mL | Freq: Two times a day (BID) | INTRAVENOUS | Status: DC
  Administered 2021-03-13 – 2021-03-17 (×9): 3 mL via INTRAVENOUS

## 2021-03-13 MED ORDER — EZETIMIBE 10 MG PO TABS
10.0000 mg | ORAL_TABLET | Freq: Every day | ORAL | Status: DC
  Administered 2021-03-13: 10 mg via ORAL
  Filled 2021-03-13: qty 1

## 2021-03-13 MED ORDER — ACETAMINOPHEN 325 MG PO TABS
650.0000 mg | ORAL_TABLET | Freq: Four times a day (QID) | ORAL | Status: DC
  Administered 2021-03-13 – 2021-03-14 (×5): 650 mg via ORAL
  Filled 2021-03-13 (×5): qty 2

## 2021-03-13 MED ORDER — DULOXETINE HCL 60 MG PO CPEP
60.0000 mg | ORAL_CAPSULE | Freq: Every day | ORAL | Status: DC
  Administered 2021-03-13: 60 mg via ORAL
  Filled 2021-03-13: qty 1

## 2021-03-13 MED ORDER — ASPIRIN EC 81 MG PO TBEC
81.0000 mg | DELAYED_RELEASE_TABLET | Freq: Every day | ORAL | Status: DC
  Administered 2021-03-13 – 2021-03-18 (×6): 81 mg via ORAL
  Filled 2021-03-13 (×6): qty 1

## 2021-03-13 MED ORDER — ENOXAPARIN SODIUM 60 MG/0.6ML IJ SOSY
60.0000 mg | PREFILLED_SYRINGE | Freq: Once | INTRAMUSCULAR | Status: AC
  Administered 2021-03-13: 60 mg via SUBCUTANEOUS
  Filled 2021-03-13: qty 0.6

## 2021-03-13 MED ORDER — HEPARIN (PORCINE) 25000 UT/250ML-% IV SOLN
1000.0000 [IU]/h | INTRAVENOUS | Status: DC
  Administered 2021-03-13: 750 [IU]/h via INTRAVENOUS
  Filled 2021-03-13: qty 250

## 2021-03-13 MED ORDER — ALBUTEROL SULFATE (2.5 MG/3ML) 0.083% IN NEBU
2.5000 mg | INHALATION_SOLUTION | RESPIRATORY_TRACT | Status: DC | PRN
  Administered 2021-03-15: 2.5 mg via RESPIRATORY_TRACT
  Filled 2021-03-13: qty 3

## 2021-03-13 MED ORDER — ADULT MULTIVITAMIN W/MINERALS CH
1.0000 | ORAL_TABLET | Freq: Every day | ORAL | Status: DC
  Administered 2021-03-13 – 2021-03-18 (×6): 1 via ORAL
  Filled 2021-03-13 (×6): qty 1

## 2021-03-13 MED ORDER — PROSOURCE PLUS PO LIQD
30.0000 mL | Freq: Three times a day (TID) | ORAL | Status: DC
  Administered 2021-03-13 – 2021-03-18 (×12): 30 mL via ORAL
  Filled 2021-03-13 (×12): qty 30

## 2021-03-13 MED ORDER — HYDROCODONE BIT-HOMATROP MBR 5-1.5 MG/5ML PO SOLN: 5.0000 mL | Freq: Four times a day (QID) | ORAL | Status: DC | PRN

## 2021-03-13 MED ORDER — HYDROCHLOROTHIAZIDE 25 MG PO TABS: 25.0000 mg | ORAL_TABLET | Freq: Every morning | ORAL | Status: DC

## 2021-03-13 MED ORDER — GABAPENTIN 100 MG PO CAPS
100.0000 mg | ORAL_CAPSULE | Freq: Three times a day (TID) | ORAL | Status: DC
  Administered 2021-03-13 – 2021-03-18 (×13): 100 mg via ORAL
  Filled 2021-03-13 (×13): qty 1

## 2021-03-13 MED ORDER — MOMETASONE FURO-FORMOTEROL FUM 200-5 MCG/ACT IN AERO
2.0000 | INHALATION_SPRAY | Freq: Two times a day (BID) | RESPIRATORY_TRACT | Status: DC
  Administered 2021-03-14 – 2021-03-17 (×8): 2 via RESPIRATORY_TRACT
  Filled 2021-03-13: qty 8.8

## 2021-03-13 MED ORDER — LORATADINE 10 MG PO TABS
10.0000 mg | ORAL_TABLET | Freq: Every day | ORAL | Status: DC
  Administered 2021-03-13 – 2021-03-18 (×5): 10 mg via ORAL
  Filled 2021-03-13 (×6): qty 1

## 2021-03-13 MED ORDER — HEPARIN BOLUS VIA INFUSION
2000.0000 [IU] | Freq: Once | INTRAVENOUS | Status: AC
  Administered 2021-03-13: 2000 [IU] via INTRAVENOUS
  Filled 2021-03-13: qty 2000

## 2021-03-13 MED ORDER — LORAZEPAM 0.5 MG PO TABS
0.5000 mg | ORAL_TABLET | Freq: Three times a day (TID) | ORAL | Status: DC | PRN
  Administered 2021-03-15 – 2021-03-16 (×2): 0.5 mg via ORAL
  Filled 2021-03-13 (×2): qty 1

## 2021-03-13 MED ORDER — POLYETHYLENE GLYCOL 3350 17 G PO PACK: 17.0000 g | PACK | Freq: Every day | ORAL | Status: DC | PRN

## 2021-03-13 MED ORDER — OXYCODONE HCL 5 MG PO TABS
5.0000 mg | ORAL_TABLET | ORAL | Status: DC | PRN
  Administered 2021-03-13 – 2021-03-14 (×3): 5 mg via ORAL
  Filled 2021-03-13 (×4): qty 1

## 2021-03-13 MED ORDER — BENZONATATE 100 MG PO CAPS: 100.0000 mg | ORAL_CAPSULE | Freq: Three times a day (TID) | ORAL | Status: DC | PRN

## 2021-03-13 MED ORDER — HEPARIN BOLUS VIA INFUSION
3000.0000 [IU] | Freq: Once | INTRAVENOUS | Status: AC
  Administered 2021-03-13: 3000 [IU] via INTRAVENOUS

## 2021-03-13 MED ORDER — DOCUSATE SODIUM 100 MG PO CAPS
100.0000 mg | ORAL_CAPSULE | Freq: Two times a day (BID) | ORAL | Status: DC
  Administered 2021-03-13 – 2021-03-16 (×5): 100 mg via ORAL
  Filled 2021-03-13 (×7): qty 1

## 2021-03-13 MED ORDER — PANTOPRAZOLE SODIUM 40 MG PO TBEC
40.0000 mg | DELAYED_RELEASE_TABLET | Freq: Every day | ORAL | Status: DC
  Administered 2021-03-13 – 2021-03-18 (×6): 40 mg via ORAL
  Filled 2021-03-13 (×6): qty 1

## 2021-03-13 MED ORDER — BOOST / RESOURCE BREEZE PO LIQD CUSTOM
1.0000 | Freq: Three times a day (TID) | ORAL | Status: DC
  Administered 2021-03-13 – 2021-03-18 (×9): 1 via ORAL

## 2021-03-13 MED ORDER — FENTANYL CITRATE PF 50 MCG/ML IJ SOSY: 12.5000 ug | PREFILLED_SYRINGE | INTRAMUSCULAR | Status: DC | PRN

## 2021-03-13 MED ORDER — ATORVASTATIN CALCIUM 40 MG PO TABS
40.0000 mg | ORAL_TABLET | Freq: Every day | ORAL | Status: DC
  Administered 2021-03-13 – 2021-03-18 (×6): 40 mg via ORAL
  Filled 2021-03-13 (×6): qty 1

## 2021-03-13 MED ORDER — ONDANSETRON HCL 4 MG PO TABS
4.0000 mg | ORAL_TABLET | Freq: Four times a day (QID) | ORAL | Status: DC | PRN
  Administered 2021-03-18: 4 mg via ORAL
  Filled 2021-03-13: qty 1

## 2021-03-13 MED ORDER — FENTANYL CITRATE PF 50 MCG/ML IJ SOSY
12.5000 ug | PREFILLED_SYRINGE | INTRAMUSCULAR | Status: DC | PRN
  Administered 2021-03-13: 12.5 ug via INTRAVENOUS
  Filled 2021-03-13: qty 1

## 2021-03-13 MED ORDER — BISACODYL 5 MG PO TBEC: 5.0000 mg | DELAYED_RELEASE_TABLET | Freq: Every day | ORAL | Status: DC | PRN

## 2021-03-13 MED ORDER — LEVOTHYROXINE SODIUM 88 MCG PO TABS
88.0000 ug | ORAL_TABLET | Freq: Every day | ORAL | Status: DC
  Administered 2021-03-14 – 2021-03-15 (×2): 88 ug via ORAL
  Filled 2021-03-13 (×2): qty 1

## 2021-03-13 MED ORDER — FLUTICASONE PROPIONATE 50 MCG/ACT NA SUSP
2.0000 | Freq: Every day | NASAL | Status: DC
  Administered 2021-03-13 – 2021-03-18 (×5): 2 via NASAL
  Filled 2021-03-13: qty 16

## 2021-03-13 MED ORDER — ENOXAPARIN SODIUM 60 MG/0.6ML IJ SOSY
60.0000 mg | PREFILLED_SYRINGE | Freq: Two times a day (BID) | INTRAMUSCULAR | Status: DC
  Administered 2021-03-14 (×2): 60 mg via SUBCUTANEOUS
  Filled 2021-03-13 (×2): qty 0.6

## 2021-03-13 NOTE — Progress Notes (Signed)
ANTICOAGULATION CONSULT NOTE - Initial Consult  Pharmacy Consult for Heparin  Indication: pulmonary embolus  Allergies  Allergen Reactions   Penicillins Anaphylaxis   Adhesive [Tape] Other (See Comments)    Blisters    Doxycycline Nausea And Vomiting   Morphine And Related Other (See Comments)    HALLUCINATIONS    Patient Measurements: Height: 5\' 3"  (160 cm) Weight: 59.4 kg (130 lb 15.3 oz) IBW/kg (Calculated) : 52.4   Vital Signs: Temp: 97.8 F (36.6 C) (01/12 1928) BP: 140/75 (01/13 0130) Pulse Rate: 76 (01/13 0130)  Labs: Recent Labs    03/12/21 2047 03/12/21 2236 03/12/21 2252  HGB 14.3  --  15.3*  HCT 45.5  --  45.0  PLT 441*  --   --   CREATININE 0.81  --   --   TROPONINIHS 30* 37*  --     Estimated Creatinine Clearance: 42.8 mL/min (by C-G formula based on SCr of 0.81 mg/dL).   Medical History: Past Medical History:  Diagnosis Date   Anemia    Asthma    Breast cancer ([place]) RIGHT SIDE-- DX OCT 2011   CHEMORADIATION THERAPY-- COMPLETED    CHF (congestive heart failure) ([facility])    COPD (chronic obstructive pulmonary disease) ([facility])    Diabetes mellitus without complication ([facility])    Ecchymosis    Fibromyalgia    Fibromyalgia    Frequency of urination    History of breast cancer LEFT BREAST DCIS  1996   History of DVT (deep vein thrombosis)    History of pulmonary embolism    Hyperlipidemia    Hypertension    IBS (irritable bowel syndrome)    Lung mass PER DR CLANCE NOTE UNCLEAR IF LYMPHOMA FROM BX   FOLLOWED BY DR RUBIN   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   Nocturia    Osteoporosis    PONV (postoperative nausea and vomiting)    Seasonal allergies    Seizures ([facility])    Skin tear LEFT ARM COVERED W/ TEGADERM   PT STATES HX MULTIPLE SKIN TEARS -- THIN   Thin skin SECONARDY AGE AND HX CHEMO   Unspecified hereditary and idiopathic peripheral neuropathy    Vertebral compression fracture ([facility]) 5/14   L4      Assessment: 87 y/o F with subsegmental PE, pt has history of VTE no longer on anti-coagulants, CBC/renal function is good.   Goal of Therapy:  Heparin level 0.3-0.7 units/ml Monitor platelets by anticoagulation protocol: Yes   Plan:  Heparin 3000 units BOLUS Start heparin drip at 750 units/hr 0900 Heparin level Daily CBC/Heparin level Monitor for bleeding  Narda Bonds, PharmD, BCPS Clinical Pharmacist Phone: [phone]

## 2021-03-13 NOTE — Assessment & Plan Note (Signed)
-  Patient with h/o O2-dependent COPD presenting with hypoxia to 75% despite home O2 -She was found to have a PE which likely developed from being increasingly sedentary after a recent fall which led to rib fractures -Will admit to telemetry for ongoing monitoring and treatment

## 2021-03-13 NOTE — Progress Notes (Signed)
Initial Nutrition Assessment  DOCUMENTATION CODES:   Severe malnutrition in context of chronic illness  INTERVENTION:  -[employer] Instant Breakfast TID w/ meals -Boost Breeze po TID, each supplement provides 250 kcal and 9 grams of protein -PROSource PLUS PO 10mls TID, each supplement provides 100 kcals and 15 grams of protein -MVI with minerals daily -Recommend initiation of imodium  -Provided education on improving nutrition status upon discharge and nutrition management of/intervention for diarrhea  Recommend c/s to GI and checking the following labs due to concern for deficiency 2/2 findings from nutrition-focused physical exam, severe protein-calorie malnutrition, poor/inadequate PO intake, chronic diarrhea, and concern for malabsorption: Vitamin/MIneral Profile:  Thiamine B1: Vitamin B12: Folate B9: Biotin B7: Iron/Ferritin: Vitamin A: Vitamin D: Vitamin E: Vitamin K: Vitamin C: Copper:  Selenium: Zinc:  NUTRITION DIAGNOSIS:   Severe Malnutrition related to chronic illness (CHF, COPD, IBS) as evidenced by severe fat depletion, severe muscle depletion, energy intake < or equal to 75% for > or equal to 1 month.  GOAL:   Patient will meet greater than or equal to 90% of their needs  MONITOR:   PO intake, Supplement acceptance, Weight trends, Skin, Labs, I & O's  REASON FOR ASSESSMENT:   Consult Other (Comment) ("nutritional goals")  ASSESSMENT:   Pt with PMH significant for breast cancer (2011), CHF, COPD on 4L O2 at baseline, hypothyroidism, DM, fibromyalgia, DVT/PE, HTN, and HLD presenting with pain 2/2 rib fx s/p mechanical fall -- admitted with acute on chronic respiratory failure with hypoxia and pulmonary embolism  RN in room at time of visit.   Pt's family at bedside assisting with history. Pt reports severe chronic pain, very poor appetite/po intake, and chronic diarrhea. States she has a BM within 15 minutes after eating and that this has been  consistently happening for many years. Pt cannot remember when this began occurring. Pt not actively seeing GI, but reports having previously worked with Dr. Watt Climes. Pt/family unable to provide more information regarding this. Pt states diarrhea is due to IBS-D. Does not identify any triggers. Pt states she doesn't eat typical meals (primarily snacks on salty crackers/chips) and even when offered a meal, she endorses very early satiety. Pt does not like Ensure or similar supplements, but is agreeable to use of Boost Breeze, [name], and prosource. Discussed options for improving nutrition status at home in detail. Also discussed nutrition management of diarrhea at length.   Recommend c/s to GI and checking the following labs due to concern for deficiency 2/2 findings from nutrition-focused physical exam, severe protein-calorie malnutrition, poor/inadequate PO intake, chronic diarrhea, and concern for malabsorption: Vitamin/MIneral Profile:  Thiamine B1: Vitamin B12: Folate B9: Biotin B7: Iron/Ferritin: Vitamin A: Vitamin D: Vitamin E: Vitamin K: Vitamin C: Copper:  Selenium: Zinc:  PO Intake: none documented  RD observed untouched meal tray   No UOP documented x24 hours I/O: +136ml since admit  Medications: Scheduled Meds:  (feeding supplement) PROSource Plus  30 mL Oral TID BM   acetaminophen  650 mg Oral Q6H   aspirin EC  81 mg Oral Daily   atorvastatin  40 mg Oral Daily   docusate sodium  100 mg Oral BID   [START ON 03/14/2021] enoxaparin (LOVENOX) injection  60 mg Subcutaneous BID   feeding supplement  1 Container Oral TID BM   fluticasone  2 spray Each Nare Daily   gabapentin  100 mg Oral TID   [START ON 03/14/2021] hydrochlorothiazide  25 mg Oral q morning   levothyroxine  88 mcg Oral QAC breakfast   lidocaine  1 patch Transdermal Q24H   loratadine  10 mg Oral Daily   mometasone-formoterol  2 puff Inhalation BID   multivitamin with minerals  1 tablet  Oral Daily   pantoprazole  40 mg Oral Daily   sodium chloride flush  3 mL Intravenous Q12H   Labs: Recent Labs  Lab 03/12/21 2047 03/12/21 2252  NA 135 135  K 3.9 3.7  CL 96*  --   CO2 26  --   BUN 25*  --   CREATININE 0.81  --   CALCIUM 8.7*  --   GLUCOSE 105*  --    Ionized Calcium 1.12 (L)  NUTRITION - FOCUSED PHYSICAL EXAM:  Flowsheet Row Most Recent Value  Orbital Region Severe depletion  Upper Arm Region Severe depletion  Thoracic and Lumbar Region Severe depletion  Buccal Region Severe depletion  Temple Region Severe depletion  Clavicle Bone Region Severe depletion  Clavicle and Acromion Bone Region Severe depletion  Scapular Bone Region Severe depletion  Dorsal Hand Severe depletion  Patellar Region Severe depletion  Anterior Thigh Region Severe depletion  Posterior Calf Region Severe depletion  Edema (RD Assessment) None  Hair Other (Comment)  [hairloss/thinning]  Eyes Reviewed  Mouth Reviewed  Skin Other (Comment)  [dehydrated]  Nails Other (Comment)  [pointed/clubbing?]       Diet Order:   Diet Order             Diet regular Room service appropriate? Yes; Fluid consistency: Thin  Diet effective now                   EDUCATION NEEDS:   Education needs have been addressed  Skin:  Skin Assessment: Reviewed RN Assessment  Last BM:  1/13  Height:   Ht Readings from Last 1 Encounters:  03/12/21 5\' 3"  (1.6 m)    Weight:  Wt Readings from Last 10 Encounters:  03/12/21 59.4 kg  03/25/17 59.4 kg  02/16/17 58 kg  02/11/17 59.4 kg  07/12/16 60.4 kg  05/10/16 54.4 kg  10/07/15 63.6 kg  04/16/15 67.4 kg  03/26/15 68.9 kg  11/05/14 70.4 kg    BMI:  Body mass index is 23.2 kg/m.  Estimated Nutritional Needs:   Kcal:  1700-1900  Protein:  75-85 grams  Fluid:  >1.7L/d     Theone Stanley., MS, RD, LDN (she/her/hers) RD pager number and weekend/on-call pager number located in [place].

## 2021-03-13 NOTE — Assessment & Plan Note (Signed)
-  Patient is on almost 4L home O2 -No apparent exacerbation at this time -Will continue home meds - Advair (Dulera per formulary), Hycodan, and Albuterol as well as Flonase and Claritin

## 2021-03-13 NOTE — Care Management (Signed)
Opyd, MD  at bedside for initial assessment.

## 2021-03-13 NOTE — TOC Benefit Eligibility Note (Signed)
Transition of Care [facility]) Benefit Eligibility Note    Patient Details  Name: ALEXYA MCDARIS MRN: [id number] Date of Birth: Mar 15, 1934   Medication/Dose: Arne Cleveland  5MG  BID  Covered?: Yes  Tier: 3 Drug  Prescription Coverage Preferred Pharmacy: [facility] with Person/Company/Phone Number:: [employer]  @ [employer] #  [phone]  Co-Pay: $415.00  Prior Approval: No  Deductible:  (NO DEDUCTIBLE WITH PLAN  /  OUT-OF-POCKET:UNMET)  Additional Notes: XARELTO 20 MG DAILY : COVER- YES  , CO-PAY- $45.00  , [place] ,  PRIOR APPROVAL- NO    Memory Argue Phone Number: 03/13/2021, 3:09 PM

## 2021-03-13 NOTE — Assessment & Plan Note (Signed)
-  Continue Synthroid at current dose

## 2021-03-13 NOTE — ED Notes (Signed)
Paged hospitalist to Dr. Sedonia Small

## 2021-03-13 NOTE — Assessment & Plan Note (Signed)
-  Patient denies prior episodes of VTE but has these listed in her chart; now presenting with new PE -Likely related to decreased mobility in the setting of multiple ribs fractures -Will admit on telemetry -No R heart strain on CT -Initiate anticoagulation - for now, will start treatment-dose heparin -She is likely able to transition to alternative [facility] agent tomorrow -Will request TOC team consultation to assist with cost analysis of the various DOAC options based on her insurance -The patient understands that thromboembolic disease can be catastrophic and even deadly and that he must be complaint with physician appointments and recommended anticoagulation.

## 2021-03-13 NOTE — Assessment & Plan Note (Signed)
-  Multiple left-sided rib fractures from a mechanical fall -Pain control with lidoderm and opiates as needed -Aggressive pulmonary toilet is encouraged, particularly in the setting of advanced COPD

## 2021-03-13 NOTE — ED Notes (Signed)
Dressing applied to skin tear on left elbow

## 2021-03-13 NOTE — Progress Notes (Addendum)
ANTICOAGULATION CONSULT NOTE - Initial Consult  Pharmacy Consult for Heparin>lovenox Indication: pulmonary embolus  Allergies  Allergen Reactions   Penicillins Anaphylaxis   Adhesive [Tape] Other (See Comments)    Blisters    Morphine     Other reaction(s): Other (See Comments) HALLUCINATIONS   Doxycycline Nausea And Vomiting   Morphine And Related Other (See Comments)    HALLUCINATIONS    Patient Measurements: Height: 5\' 3"  (160 cm) Weight: 59.4 kg (130 lb 15.3 oz) IBW/kg (Calculated) : 52.4   Vital Signs: Temp: 98.9 F (37.2 C) (01/13 1024) Temp Source: Oral (01/13 1024) BP: 129/64 (01/13 1024) Pulse Rate: 70 (01/13 1024)  Labs: Recent Labs    03/12/21 2047 03/12/21 2236 03/12/21 2252 03/13/21 0618 03/13/21 0842  HGB 14.3  --  15.3* 14.5  --   HCT 45.5  --  45.0 44.8  --   PLT 441*  --   --  392  --   HEPARINUNFRC  --   --   --   --  <0.10*  CREATININE 0.81  --   --   --   --   TROPONINIHS 30* 37*  --   --   --      Estimated Creatinine Clearance: 42.8 mL/min (by C-G formula based on SCr of 0.81 mg/dL).   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   Heparin level  came back undetectable this AM. We will rebolus her and increase rate.  Addendum:  Ok to change to SQ Lovenox for anticoagulation per Dr. Lorin Mercy.  Goal of Therapy:  Heparin level 0.3-0.7 units/ml Monitor platelets by anticoagulation protocol: Yes   Plan:  Dc heparin Lovenox 60mg  SQ BID F/u oral [facility] plan  Onnie Boer, PharmD, BCIDP, AAHIVP, CPP Infectious Disease Pharmacist 03/13/2021 11:41 AM

## 2021-03-13 NOTE — ED Notes (Signed)
Called [facility] to transport patient to [facility] 5N room 23

## 2021-03-13 NOTE — Plan of Care (Signed)
°  Problem: Education: Goal: Knowledge of General Education information will improve Description: Including pain rating scale, medication(s)/side effects and non-pharmacologic comfort measures Outcome: Not Progressing   Problem: Health Behavior/Discharge Planning: Goal: Ability to manage health-related needs will improve Outcome: Not Progressing   Problem: Clinical Measurements: Goal: Ability to maintain clinical measurements within normal limits will improve Outcome: Not Progressing Goal: Will remain free from infection Outcome: Not Progressing Goal: Diagnostic test results will improve Outcome: Not Progressing Goal: Respiratory complications will improve Outcome: Not Progressing Goal: Cardiovascular complication will be avoided Outcome: Not Progressing   Problem: Activity: Goal: Risk for activity intolerance will decrease Outcome: Not Progressing   Problem: Nutrition: Goal: Adequate nutrition will be maintained Outcome: Not Progressing   

## 2021-03-13 NOTE — Progress Notes (Signed)
Benefits check in process for Eliquis 5 mg twice a daily and Xaralto 20 mg daily. Whitman Hero RN,BSN,CM

## 2021-03-13 NOTE — Assessment & Plan Note (Addendum)
-  Zocor changed to Lipitor given high dose per formulary

## 2021-03-13 NOTE — Assessment & Plan Note (Addendum)
-  Uncertain duration -She has chronic back pain with known chronic compression fractures -She reports that pain medications didn't help so she just doesn't take them -As a result, her mobility is quite impaired and she is sedentary -PT/OT consults -Continue Neurontin

## 2021-03-13 NOTE — ED Notes (Incomplete)
Called [facility] to transport patient to [facility]

## 2021-03-13 NOTE — Assessment & Plan Note (Signed)
-  Continue Ativan -She is no longer taking Cymbalta

## 2021-03-13 NOTE — H&P (Signed)
History and Physical    PatientMarland Kitchen [name] ZLD:[id number] DOB: Aug 21, 1934 DOA: 03/12/2021 DOS: the patient was seen and examined on 03/13/2021 PCP: Reynold Bowen, MD  Patient coming from: Home - lives with son; Donald Prose: Pandora Leiter, [phone]   Chief Complaint: Fall  HPI: [name] is a 87 y.o. female with medical history significant of breast cancer (2011); chronic diastolic CHF; COPD on home O2; hypothyroidism; DM; fibromyalgia; DVT/PE; HTN; and HLD presenting with a fall.  She fell Tuesday; she was feeling well prior.  She did not lose her balance.  She was walking from the kitchen to the living room and slid.  She did not lose consciousness.  She is always SOB, wears just under 4L home O2   She did her usual after the fall - sitting or lying on the couch.  She called Dr. Forde Dandy and talked with the nurse and she suggested she go in because she was hurting badly on hier left chest.  No more back pain than usual.  She is always in pain.      ER Course:  [facility] transfer, per Dr. Cyd Silence:  Hx CHF, DVT/PE admitted w/ fall with 3 rib fractures.  CTA chest showed LUL PE, on heparin gtt. Acute on chronic resp failure, on 6L O2.      Review of Systems: As mentioned in the history of present illness. All other systems reviewed and are negative. Past Medical History:  Diagnosis Date   Anemia    Asthma    CHF (congestive heart failure) (HCC)    COPD (chronic obstructive pulmonary disease) (HCC)    Diabetes mellitus without complication (HCC)    Fibromyalgia    History of breast cancer LEFT BREAST DCIS  1996   History of DVT (deep vein thrombosis)    History of pulmonary embolism    Hyperlipidemia    Hypertension    IBS (irritable bowel syndrome)    Lung mass PER DR CLANCE NOTE UNCLEAR IF LYMPHOMA FROM BX   FOLLOWED BY DR RUBIN   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   Osteoporosis    PONV (postoperative nausea and vomiting)     Vertebral compression fracture ([place]) 06/2012   L4   Past Surgical History:  Procedure Laterality Date   APPENDECTOMY     BILATERAL CARPAL TUNNEL RELEASE     BILATERAL LAMINECTOMY/ FORAMINOTOMY  01-14-2004   L4 - L5   BREAST LUMPECTOMY  1996    LEFT BREAST DCIS    BREAST LUMPECTOMY Right 2012   radiation and chemo   BRONCHOSCOPY  01-29-2010   W/ BX   CATARACT EXTRACTION W/ INTRAOCULAR LENS  IMPLANT, BILATERAL     CHOLECYSTECTOMY     COLONOSCOPY     COLONOSCOPY WITH PROPOFOL Left 04/16/2014   Procedure: COLONOSCOPY WITH PROPOFOL;  Surgeon: Winfield Cunas., MD;  Location: [facility] ENDOSCOPY;  Service: Endoscopy;  Laterality: Left;   ESOPHAGOGASTRODUODENOSCOPY N/A 03/11/2013   Procedure: ESOPHAGOGASTRODUODENOSCOPY (EGD);  Surgeon: Lear Ng, MD;  Location: [facility] ENDOSCOPY;  Service: Endoscopy;  Laterality: N/A;   ESOPHAGOGASTRODUODENOSCOPY (EGD) WITH PROPOFOL Left 04/16/2014   Procedure: ESOPHAGOGASTRODUODENOSCOPY (EGD) WITH PROPOFOL;  Surgeon: Winfield Cunas., MD;  Location: [facility] ENDOSCOPY;  Service: Endoscopy;  Laterality: Left;   FEMUR FRACTURE SURGERY  12-18-2010  DR [facility]   INTRAMEDULLARY NAILING LEFT INTERTROCHANTERIC -SUBTROCHANTERIC FX   HARDWARE REMOVAL  10/11/2011   Procedure: HARDWARE REMOVAL;  Surgeon: Johnn Hai, MD;  Location: [place];  Service: Orthopedics;  Laterality: Left;  LEFT THIGH REMOVAL OF DISTAL LOCKING SCREW NEEDS: FLORO, RADIO LUSCENT TABLE AND SCREWDRIVER FOR BIOMET ROD    PLACEMENT PORT- A- CATH  02-18-2010   RIGHT BREAST LUMPECTOMY / REMOVAL LYMPH NODES X2/ REMOVAL PAC  10-01-2010   CARCINOMA RIGHT BREAST   TONSILLECTOMY     TOTAL ABDOMINAL HYSTERECTOMY W/ BILATERAL SALPINGOOPHORECTOMY  1977  (APPROX)   TRANSTHORACIC ECHOCARDIOGRAM  02-25-2010   LVSF NORMAL/ EF 47-42%/ GRADE I DIASTOLIC DYSFUNCTION/ MILDLY DILATED LEFT ATRIUM   TVT VAGINAL TAPE  SUBURETHRAL SLING   06-08-2005   SUI   Social History:  reports that she quit  smoking about 43 years ago. Her smoking use included cigarettes. She has a 30.00 pack-year smoking history. She has never used smokeless tobacco. She reports current alcohol use of about 7.0 - 10.0 standard drinks per week. She reports that she does not use drugs.  Allergies  Allergen Reactions   Penicillins Anaphylaxis   Adhesive [Tape] Other (See Comments)    Blisters    Morphine     Other reaction(s): Other (See Comments) HALLUCINATIONS   Doxycycline Nausea And Vomiting   Morphine And Related Other (See Comments)    HALLUCINATIONS    Family History  Problem Relation Age of Onset   Emphysema Father    CAD Father    Ovarian cancer Mother    Cancer Mother        cervical   Alzheimer's disease Mother    Hyperlipidemia Mother    Breast cancer Mother    Cancer Sister        cervical   Breast cancer Sister    Breast cancer Maternal Grandmother    Breast cancer Maternal Aunt     Prior to Admission medications   Medication Sig Start Date End Date Taking? Authorizing Provider  acetaminophen (TYLENOL) 500 MG tablet Take 2 tablets (1,000 mg total) by mouth every 6 (six) hours as needed for mild pain or moderate pain. 05/10/16   Leo Grosser, MD  albuterol (ACCUNEB) 1.25 MG/3ML nebulizer solution Take 1 ampule by nebulization every 6 (six) hours as needed for wheezing.    [provider]  albuterol (PROVENTIL HFA;VENTOLIN HFA) 108 (90 Base) MCG/ACT inhaler Inhale 2 puffs into the lungs every 6 (six) hours as needed for wheezing or shortness of breath. 03/25/17   Collene Gobble, MD  aspirin EC 81 MG tablet Take 81 mg by mouth daily.    [provider]  benzonatate (TESSALON) 100 MG capsule Take 100 mg by mouth 3 (three) times daily as needed for cough.    [provider]  DULoxetine (CYMBALTA) 60 MG capsule Take 60 mg by mouth daily after lunch.     [provider]  ergocalciferol (VITAMIN D2) 50000 UNITS capsule Take 50,000 Units by mouth once a week.  Every Tuesday.    [provider]  ezetimibe (ZETIA) 10 MG tablet Take 1 tablet by mouth daily. 03/13/18   [provider]  fluticasone (FLONASE) 50 MCG/ACT nasal spray Place 2 sprays into both nostrils daily. 02/11/17   Collene Gobble, MD  Fluticasone-Salmeterol (ADVAIR DISKUS) 250-50 MCG/DOSE AEPB Inhale 1 puff into the lungs 2 (two) times daily. (PATIENT WAS GIVEN SAMPLES-ALTERNATES WITH SYMBICORT SAMPLES)    [provider]  gabapentin (NEURONTIN) 100 MG capsule Take 100 mg by mouth 3 (three) times daily.     [provider]  levothyroxine (SYNTHROID, LEVOTHROID) 88 MCG tablet  Take 88 mcg by mouth daily before breakfast. For hypothyroid    [provider]  loratadine (CLARITIN) 10 MG tablet Take 1 tablet (10 mg total) by mouth daily. 02/11/17   Collene Gobble, MD  LORazepam (ATIVAN) 0.5 MG tablet Take 0.5 mg by mouth every 8 (eight) hours as needed for anxiety. Anxiety 07/04/12   Vivien Rota, NP  pantoprazole (PROTONIX) 40 MG tablet Take 1 tablet (40 mg total) by mouth daily. 02/11/17   Collene Gobble, MD  simvastatin (ZOCOR) 80 MG tablet Take 1 tablet by mouth daily. 03/26/18   [provider]    Physical Exam: Vitals:   03/13/21 0345 03/13/21 0540 03/13/21 1024 03/13/21 1122  BP:  134/68 129/64 134/73  Pulse: 75 75 70   Resp: 18 16 19 18   Temp:  98.5 F (36.9 C) 98.9 F (37.2 C) 98.5 F (36.9 C)  TempSrc:  Oral Oral Oral  SpO2: 94% 96% 92% 95%  Weight:      Height:       General:  Appears calm and comfortable and is in NAD, on 5L  O2 Eyes:  PERRL, EOMI, normal lids, iris ENT:  grossly normal hearing, lips & tongue, mmm Neck:  no LAD, masses or thyromegaly Cardiovascular:  RRR, no m/r/g. No LE edema.  Respiratory:   CTA bilaterally with no wheezes/rales/rhonchi.  Normal to mildly increased respiratory effort. Abdomen:  soft, NT, ND Skin:  no rash or induration seen on limited exam Musculoskeletal:  grossly normal  tone BUE/BLE, good ROM, no bony abnormality Psychiatric:  grossly normal mood and affect, speech fluent and appropriate, AOx3 Neurologic:  CN 2-12 grossly intact, moves all extremities in coordinated fashion   Radiological Exams on Admission: Independently reviewed - see discussion in A/P where applicable  DG Ribs Unilateral W/Chest Left  Result Date: 03/12/2021 CLINICAL DATA:  Fall with left tarsal pain EXAM: LEFT RIBS AND CHEST - 3+ VIEW COMPARISON:  Chest CT 04/01/2015, chest x-ray 04/17/2014 FINDINGS: Single view chest demonstrates diffuse coarse reticular opacity consistent with emphysema and chronic interstitial lung disease, findings appear worse as compared with prior radiograph. Possible small left-sided pleural effusion. Moderate hiatal hernia. Clips from in the left axilla and at the chest wall. Left rib series demonstrates acute left fourth fifth and sixth rib fractures. IMPRESSION: 1. Acute left fourth through sixth rib fractures. No visible pneumothorax. 2. Diffuse coarse interstitial reticular opacity consistent with emphysema and probable chronic lung disease though difficult to exclude acute superimposed interstitial process. Small left effusion. 3. Hiatal hernia Electronically Signed   By: Donavan Foil M.D.   On: 03/12/2021 20:18   CT Head Wo Contrast  Result Date: 03/12/2021 CLINICAL DATA:  Head trauma, minor (Age >= 65y).  Fall. EXAM: CT HEAD WITHOUT CONTRAST TECHNIQUE: Contiguous axial images were obtained from the base of the skull through the vertex without intravenous contrast. RADIATION DOSE REDUCTION: This exam was performed according to the departmental dose-optimization program which includes automated exposure control, adjustment of the mA and/or kV according to patient size and/or use of iterative reconstruction technique. COMPARISON:  05/10/2016 FINDINGS: Brain: Mild age related volume loss. No acute intracranial abnormality. Specifically, no hemorrhage, hydrocephalus,  mass lesion, acute infarction, or significant intracranial injury. Vascular: No hyperdense vessel or unexpected calcification. Skull: No acute calvarial abnormality. Sinuses/Orbits: No acute findings Other: None IMPRESSION: No acute intracranial abnormality. Electronically Signed   By: Rolm Baptise M.D.   On: 03/12/2021 21:27   CT Angio Chest PE W  and/or Wo Contrast  Result Date: 03/12/2021 CLINICAL DATA:  Pulmonary embolism (PE) suspected, high prob Fall on left side after tripping over oxygen.  Left-sided pain. EXAM: CT ANGIOGRAPHY CHEST WITH CONTRAST TECHNIQUE: Multidetector CT imaging of the chest was performed using the standard protocol during bolus administration of intravenous contrast. Multiplanar CT image reconstructions and MIPs were obtained to evaluate the vascular anatomy. RADIATION DOSE REDUCTION: This exam was performed according to the departmental dose-optimization program which includes automated exposure control, adjustment of the mA and/or kV according to patient size and/or use of iterative reconstruction technique. CONTRAST:  66mL OMNIPAQUE IOHEXOL 350 MG/ML SOLN COMPARISON:  Rib radiographs earlier today. FINDINGS: Cardiovascular: Subsegmental embolus in the anterior left upper lobe, series 6, image 99. No additional pulmonary emboli. Dilated main pulmonary artery at 3.4 cm. Atherosclerotic tortuous thoracic aorta. There are coronary artery calcifications. Cardiomegaly with right heart dilatation. Contrast refluxes into the hepatic veins and IVC. No pericardial effusion. Mediastinum/Nodes: No mediastinal hemorrhage or hematoma. Scattered mediastinal lymph nodes, likely reactive. Prominent right hilar node. Moderate-sized hiatal hernia. Lungs/Pleura: Advanced emphysema and cystic lung disease. Significant progression from 2017 exam. Moderate left pleural effusion with adjacent atelectasis. No pneumothorax. Upper Abdomen: No free fluid. Contrast refluxes into the hepatic veins and IVC.  Musculoskeletal: Left lateral fifth, 6, and seventh rib fractures, mildly displaced. There are multiple posterior rib fractures that have callus formation and are remote. Chronic and unchanged compression fractures of T7, T8, T10, and T12. T3 superior endplate compression fracture is new from 2017 exam, but age indeterminate. Surgical clips in both breasts. No confluent chest wall soft tissue hematoma. Review of the MIP images confirms the above findings. IMPRESSION: 1. Subsegmental embolus in the anterior left upper lobe. 2. Left lateral fifth, sixth, and seventh rib fractures, mildly displaced. 3. Moderate left pleural effusion with adjacent atelectasis. No pneumothorax. 4. Advanced chronic lung disease with emphysema and diffuse pulmonary cystic change. Definite progression from 2017 CT. As clinically indicated after resolution of acute event, consider high-resolution chest CT. 5. Dilated main pulmonary artery consistent with pulmonary arterial hypertension. 6. Contrast refluxes into the hepatic veins and IVC, consistent with elevated right heart pressures. 7. T3 superior endplate compression fracture is new from 2017 exam, but age indeterminate. Multiple additional chronic compression fractures throughout the thoracic spine are unchanged. Aortic Atherosclerosis (ICD10-I70.0) and Emphysema (ICD10-J43.9). These results were called by telephone at the time of interpretation on 03/12/2021 at 11:37 pm to provider Bero, who verbally acknowledged these results. Electronically Signed   By: Keith Rake M.D.   On: 03/12/2021 23:39   CT Cervical Spine Wo Contrast  Result Date: 03/12/2021 CLINICAL DATA:  fall w/ head injury EXAM: CT CERVICAL SPINE WITHOUT CONTRAST TECHNIQUE: Multidetector CT imaging of the cervical spine was performed without intravenous contrast. Multiplanar CT image reconstructions were also generated. RADIATION DOSE REDUCTION: This exam was performed according to the departmental  dose-optimization program which includes automated exposure control, adjustment of the mA and/or kV according to patient size and/or use of iterative reconstruction technique. COMPARISON:  None. FINDINGS: Alignment: No subluxation. Skull base and vertebrae: No acute fracture. No primary bone lesion or focal pathologic process. Soft tissues and spinal canal: No prevertebral fluid or swelling. No visible canal hematoma. Disc levels:  Diffuse degenerative disc disease and facet disease. Upper chest: Cystic spaces within the upper lobes, progressing since prior study, presumably progressive bronchiectasis and emphysema. No acute findings. Other: None IMPRESSION: Diffuse degenerative disc and facet disease. No acute bony abnormality. Electronically Signed  By: Rolm Baptise M.D.   On: 03/12/2021 21:29    EKG: Independently reviewed.  NSR with rate 98; RBBB with no evidence of acute ischemia   Labs on Admission: I have personally reviewed the available labs and imaging studies at the time of the admission.  Pertinent labs:    VBG: 7.501/35.2/27.5 Albumin 3.1 Bili 2.0 BNP 746 HS troponin 30, 37 WBC 15.7 -> 14.5 COVID/flu negative UA: trace Hgb, 40 ketones, 30 protein   Assessment/Plan * Acute on chronic respiratory failure with hypoxia (HCC)- (present on admission) -Patient with h/o O2-dependent COPD presenting with hypoxia to 75% despite home O2 -She was found to have a PE which likely developed from being increasingly sedentary after a recent fall which led to rib fractures -Will admit to telemetry for ongoing monitoring and treatment  Pulmonary embolism (HCC)- (present on admission) -Patient denies prior episodes of VTE but has these listed in her chart; now presenting with new PE -Likely related to decreased mobility in the setting of multiple ribs fractures -Will admit on telemetry -No R heart strain on CT -Initiate anticoagulation - for now, will start treatment-dose heparin -She is  likely able to transition to alternative [facility] agent tomorrow -Will request TOC team consultation to assist with cost analysis of the various DOAC options based on her insurance -The patient understands that thromboembolic disease can be catastrophic and even deadly and that he must be complaint with physician appointments and recommended anticoagulation.  Rib fractures- (present on admission) -Multiple left-sided rib fractures from a mechanical fall -Pain control with lidoderm and opiates as needed -Aggressive pulmonary toilet is encouraged, particularly in the setting of advanced COPD  COPD, severe ([place])- (present on admission) -Patient is on almost 4L home O2 -No apparent exacerbation at this time -Will continue home meds - Advair (Dulera per formulary), Hycodan, and Albuterol as well as Flonase and Claritin  Vertebral compression fracture ([place])- (present on admission) -Uncertain duration -She has chronic back pain with known chronic compression fractures -She reports that pain medications didn't help so she just doesn't take them -As a result, her mobility is quite impaired and she is sedentary -PT/OT consults -Continue Neurontin  Essential hypertension- (present on admission) -Continue HCTZ and ASA -Will cover with prn IV hydralazine  Diabetes mellitus type 2 in nonobese (HCC) -Recent A1c was 5.3 -Not on medications -Will not monitor at this time  Hypothyroidism- (present on admission) -Continue Synthroid at current dose  Anxiety and depression- (present on admission) -Continue Ativan -She is no longer taking Cymbalta  Dyslipidemia- (present on admission) -Zocor changed to Lipitor given high dose per formulary    Advance Care Planning:   Code Status: DNR   Consults: Trauma surgery; TOC team; Nutrition; PT/OT  Family Communication: None present; the patient asked me not to call her son at the time of admission  Severity of Illness: The appropriate patient status for  this patient is INPATIENT. Inpatient status is judged to be reasonable and necessary in order to provide the required intensity of service to ensure the patient's safety. The patient's presenting symptoms, physical exam findings, and initial radiographic and laboratory data in the context of their chronic comorbidities is felt to place them at high risk for further clinical deterioration. Furthermore, it is not anticipated that the patient will be medically stable for discharge from the hospital within 2 midnights of admission.   * I certify that at the point of admission it is my clinical judgment that the patient will require inpatient  hospital care spanning beyond 2 midnights from the point of admission due to high intensity of service, high risk for further deterioration and high frequency of surveillance required.*  Author: Karmen Bongo, MD 03/13/2021 3:07 PM  For on call review [email or web address].

## 2021-03-13 NOTE — Assessment & Plan Note (Signed)
-  Recent A1c was 5.3 -Not on medications -Will not monitor at this time

## 2021-03-13 NOTE — Consult Note (Addendum)
[place] 19-Feb-1935  [id number].    Requesting MD: Dr. Karmen Bongo Chief Complaint/Reason for Consult: Rib fx's, GLF  HPI: [name] is a 87 y.o. female with a hx of COPD on home o2 (4L per patient report), HTN, HLD, hypothyroidism, DM2, prior PE/DVT (denies home anticoagulation) fibromyalgia, and remote hx of BC s/p lumpectomy/chemo/radiation who presented after a fall.  Patient reports she normally walks with a walker at baseline.  On Tuesday, 1/10, she was ambulating from the kitchen to her living room without her walker and fell onto her left side.  She reports hitting her head.  No LOC.  She was able to ambulate after the event.  After the fall she had left-sided chest/rib pain with increasing short of breath which prompted a visit to the ED. She also complains of pain in her mid back and L shoulder pain which is new along with generalized achiness.  She denies any headache, new neck pain, abdominal pain, or new extremity pain.  She underwent work-up in the ED was found to have left 5-7 rib fractures with moderate left pleural effusion, T3 superior endplate fracture and LUL PE.  BNP elevated at 746.  Troponin 30 -> 37. She was requiring increased oxygen demand at 6L. She was admitted to [facility] and started on Heparin gtt. We were asked to see.   Patient reports she lives in a one-story house with her son. There are 3 stairs to enter her home. She is a former smoker with 30 pack year hx. She drinks 7-10 etoh beverages per week. Patient has a history of prior appendectomy, cholecystectomy and total abdominal hysterectomy.   ROS: Review of Systems  Constitutional:  Negative for chills and fever.  Eyes:  Negative for blurred vision and double vision.  Respiratory:  Positive for shortness of breath. Negative for cough.   Cardiovascular:  Positive for chest pain and leg swelling.  Gastrointestinal:  Negative for abdominal pain, nausea and vomiting.  Musculoskeletal:  Positive  for back pain and falls.  Neurological:  Negative for loss of consciousness and headaches.  All other systems reviewed and are negative.  Family History  Problem Relation Age of Onset   Emphysema Father    CAD Father    Ovarian cancer Mother    Cancer Mother        cervical   Alzheimer's disease Mother    Hyperlipidemia Mother    Breast cancer Mother    Cancer Sister        cervical   Breast cancer Sister    Breast cancer Maternal Grandmother    Breast cancer Maternal Aunt     Past Medical History:  Diagnosis Date   Anemia    Asthma    CHF (congestive heart failure) ([place])    COPD (chronic obstructive pulmonary disease) ([place])    Diabetes mellitus without complication ([place])    Fibromyalgia    History of breast cancer LEFT BREAST DCIS  1996   History of DVT (deep vein thrombosis)    History of pulmonary embolism    Hyperlipidemia    Hypertension    IBS (irritable bowel syndrome)    Lung mass PER DR CLANCE NOTE UNCLEAR IF LYMPHOMA FROM BX   FOLLOWED BY DR RUBIN   Neuropathy due to chemotherapeutic drug ([place]) NUMBNESS / TINGLING FEET AND HANDS   Osteoporosis    PONV (postoperative nausea and vomiting)    Seizures ([place])    Vertebral compression fracture ([place]) 06/2012  L4    Past Surgical History:  Procedure Laterality Date   APPENDECTOMY     BILATERAL CARPAL TUNNEL RELEASE     BILATERAL LAMINECTOMY/ FORAMINOTOMY  01-14-2004   L4 - L5   BREAST LUMPECTOMY  1996    LEFT BREAST DCIS    BREAST LUMPECTOMY Right 2012   radiation and chemo   BRONCHOSCOPY  01-29-2010   W/ BX   CATARACT EXTRACTION W/ INTRAOCULAR LENS  IMPLANT, BILATERAL     CHOLECYSTECTOMY     COLONOSCOPY     COLONOSCOPY WITH PROPOFOL Left 04/16/2014   Procedure: COLONOSCOPY WITH PROPOFOL;  Surgeon: Winfield Cunas., MD;  Location: [facility] ENDOSCOPY;  Service: Endoscopy;  Laterality: Left;   ESOPHAGOGASTRODUODENOSCOPY N/A 03/11/2013   Procedure: ESOPHAGOGASTRODUODENOSCOPY (EGD);  Surgeon: Lear Ng, MD;  Location: [facility] ENDOSCOPY;  Service: Endoscopy;  Laterality: N/A;   ESOPHAGOGASTRODUODENOSCOPY (EGD) WITH PROPOFOL Left 04/16/2014   Procedure: ESOPHAGOGASTRODUODENOSCOPY (EGD) WITH PROPOFOL;  Surgeon: Winfield Cunas., MD;  Location: [facility] ENDOSCOPY;  Service: Endoscopy;  Laterality: Left;   FEMUR FRACTURE SURGERY  12-18-2010  DR [facility]   INTRAMEDULLARY NAILING LEFT INTERTROCHANTERIC -SUBTROCHANTERIC FX   HARDWARE REMOVAL  10/11/2011   Procedure: HARDWARE REMOVAL;  Surgeon: Johnn Hai, MD;  Location: [facility];  Service: Orthopedics;  Laterality: Left;  LEFT THIGH REMOVAL OF DISTAL LOCKING SCREW NEEDS: FLORO, RADIO LUSCENT TABLE AND SCREWDRIVER FOR BIOMET ROD    PLACEMENT PORT- A- CATH  02-18-2010   RIGHT BREAST LUMPECTOMY / REMOVAL LYMPH NODES X2/ REMOVAL PAC  10-01-2010   CARCINOMA RIGHT BREAST   TONSILLECTOMY     TOTAL ABDOMINAL HYSTERECTOMY W/ BILATERAL SALPINGOOPHORECTOMY  1977  (APPROX)   TRANSTHORACIC ECHOCARDIOGRAM  02-25-2010   LVSF NORMAL/ EF 16-10%/ GRADE I DIASTOLIC DYSFUNCTION/ MILDLY DILATED LEFT ATRIUM   TVT VAGINAL TAPE  SUBURETHRAL SLING   06-08-2005   SUI    Social History:  reports that she quit smoking about 43 years ago. Her smoking use included cigarettes. She has a 30.00 pack-year smoking history. She has never used smokeless tobacco. She reports current alcohol use of about 7.0 - 10.0 standard drinks per week. She reports that she does not use drugs.  Allergies:  Allergies  Allergen Reactions   Penicillins Anaphylaxis   Adhesive [Tape] Other (See Comments)    Blisters    Doxycycline Nausea And Vomiting   Morphine And Related Other (See Comments)    HALLUCINATIONS    Medications Prior to Admission  Medication Sig Dispense Refill   acetaminophen (TYLENOL) 500 MG tablet Take 2 tablets (1,000 mg total) by mouth every 6 (six) hours as needed for mild pain or moderate pain. 60 tablet 0   albuterol (ACCUNEB) 1.25 MG/3ML nebulizer  solution Take 1 ampule by nebulization every 6 (six) hours as needed for wheezing.     albuterol (PROVENTIL HFA;VENTOLIN HFA) 108 (90 Base) MCG/ACT inhaler Inhale 2 puffs into the lungs every 6 (six) hours as needed for wheezing or shortness of breath. 1 Inhaler 6   aspirin EC 81 MG tablet Take 81 mg by mouth daily.     benzonatate (TESSALON) 100 MG capsule Take 100 mg by mouth 3 (three) times daily as needed for cough.     DULoxetine (CYMBALTA) 60 MG capsule Take 60 mg by mouth daily after lunch.      ergocalciferol (VITAMIN D2) 50000 UNITS capsule Take 50,000 Units by mouth once a week. Every Tuesday.     ezetimibe (ZETIA) 10 MG tablet  Take 1 tablet by mouth daily.     fluticasone (FLONASE) 50 MCG/ACT nasal spray Place 2 sprays into both nostrils daily. 16 g 5   Fluticasone-Salmeterol (ADVAIR DISKUS) 250-50 MCG/DOSE AEPB Inhale 1 puff into the lungs 2 (two) times daily. (PATIENT WAS GIVEN SAMPLES-ALTERNATES WITH SYMBICORT SAMPLES)     gabapentin (NEURONTIN) 100 MG capsule Take 100 mg by mouth 3 (three) times daily.      levothyroxine (SYNTHROID, LEVOTHROID) 88 MCG tablet Take 88 mcg by mouth daily before breakfast. For hypothyroid     loratadine (CLARITIN) 10 MG tablet Take 1 tablet (10 mg total) by mouth daily. 30 tablet 5   LORazepam (ATIVAN) 0.5 MG tablet Take 0.5 mg by mouth every 8 (eight) hours as needed for anxiety. Anxiety     pantoprazole (PROTONIX) 40 MG tablet Take 1 tablet (40 mg total) by mouth daily. 30 tablet 5   simvastatin (ZOCOR) 80 MG tablet Take 1 tablet by mouth daily.       Physical Exam: Blood pressure 134/68, pulse 75, temperature 98.5 F (36.9 C), temperature source Oral, resp. rate 16, height 5\' 3"  (1.6 m), weight 59.4 kg, SpO2 96 %. General: pleasant, elderly, frail appearing white female who is laying in bed in NAD HEENT: head is normocephalic, atraumatic.  Sclera are noninjected.  PERRL.  Ears and nose without any masses or lesions.  Mouth is pink and moist.  Dentition fair Neck: No midline c-spine ttp or step offs. Trachea midline. No stridor. Supple.  Heart: regular, rate, and rhythm. No obvious murmurs noted.  Palpable radial and pedal pulses bilaterally  Lungs: CTA b/l. No wheezes, rhonchi, or rales noted.  Respiratory effort nonlabored. On 5L o2 Abd: Soft, ND, very mild tenderness on the left side of her abdomen subjectively that she cannot tell if is coming from her back. No rigidity or guarding. +BS, no masses, hernias, or organomegaly MS: Able rom of major joints to BUE and BLE without notable deficiencies or pain noted. No obvious joint swelling or bony tenderness over major joints of the BUE and BLE's. She does have some LE edema with LLE > RLE slightly.  Skin: She has a few areas of mepitel dressing to the LE's. Otherwise warm and dry with no masses, lesions, or rashes Psych: A&Ox3 with an appropriate affect Neuro: cranial nerves grossly intact, MAE's, normal speech, thought process intact, moves all extremities, gait not assessed   Results for orders placed or performed during the hospital encounter of 03/12/21 (from the past 48 hour(s))  Brain natriuretic peptide     Status: Abnormal   Collection Time: 03/12/21  8:33 PM  Result Value Ref Range   B Natriuretic Peptide 746.0 (H) 0.0 - 100.0 pg/mL    Comment: Performed at [facility], [address], [place], [place] [address]  CBC with Differential     Status: Abnormal   Collection Time: 03/12/21  8:47 PM  Result Value Ref Range   WBC 15.7 (H) 4.0 - 10.5 K/uL   RBC 5.17 (H) 3.87 - 5.11 MIL/uL   Hemoglobin 14.3 12.0 - 15.0 g/dL   HCT 45.5 36.0 - 46.0 %   MCV 88.0 80.0 - 100.0 fL   MCH 27.7 26.0 - 34.0 pg   MCHC 31.4 30.0 - 36.0 g/dL   RDW 18.0 (H) 11.5 - 15.5 %   Platelets 441 (H) 150 - 400 K/uL   nRBC 0.0 0.0 - 0.2 %   Neutrophils Relative % 86 %   Neutro Abs  13.5 (H) 1.7 - 7.7 K/uL   Lymphocytes Relative 7 %   Lymphs Abs 1.0 0.7 - 4.0 K/uL   Monocytes  Relative 7 %   Monocytes Absolute 1.1 (H) 0.1 - 1.0 K/uL   Eosinophils Relative 0 %   Eosinophils Absolute 0.0 0.0 - 0.5 K/uL   Basophils Relative 0 %   Basophils Absolute 0.0 0.0 - 0.1 K/uL   Immature Granulocytes 0 %   Abs Immature Granulocytes 0.06 0.00 - 0.07 K/uL    Comment: Performed at [facility], [address], [place], [place] [address]  Comprehensive metabolic panel     Status: Abnormal   Collection Time: 03/12/21  8:47 PM  Result Value Ref Range   Sodium 135 135 - 145 mmol/L   Potassium 3.9 3.5 - 5.1 mmol/L   Chloride 96 (L) 98 - 111 mmol/L   CO2 26 22 - 32 mmol/L   Glucose, Bld 105 (H) 70 - 99 mg/dL    Comment: Glucose reference range applies only to samples taken after fasting for at least 8 hours.   BUN 25 (H) 8 - 23 mg/dL   Creatinine, Ser 0.81 0.44 - 1.00 mg/dL   Calcium 8.7 (L) 8.9 - 10.3 mg/dL   Total Protein 7.1 6.5 - 8.1 g/dL   Albumin 3.1 (L) 3.5 - 5.0 g/dL   AST 14 (L) 15 - 41 U/L   ALT 5 0 - 44 U/L   Alkaline Phosphatase 68 38 - 126 U/L   Total Bilirubin 2.0 (H) 0.3 - 1.2 mg/dL   GFR, Estimated >60 >60 mL/min    Comment: (NOTE) Calculated using the CKD-EPI Creatinine Equation (2021)    Anion gap 13 5 - 15    Comment: Performed at [facility], [place], [place] [address]  Troponin I (High Sensitivity)     Status: Abnormal   Collection Time: 03/12/21  8:47 PM  Result Value Ref Range   Troponin I (High Sensitivity) 30 (H) <18 ng/L    Comment: (NOTE) Elevated high sensitivity troponin I (hsTnI) values and significant  changes across serial measurements may suggest ACS but many other  chronic and acute conditions are known to elevate hsTnI results.  Refer to the "Links" section for chest pain algorithms and additional  guidance. Performed at [facility], [address], [place], [place] [address]   Resp Panel by RT-PCR (Flu A&B, Covid) Nasopharyngeal Swab     Status: None    Collection Time: 03/12/21  8:56 PM   Specimen: Nasopharyngeal Swab; Nasopharyngeal(NP) swabs in vial transport medium  Result Value Ref Range   SARS Coronavirus 2 by RT PCR NEGATIVE NEGATIVE    Comment: (NOTE) SARS-CoV-2 target nucleic acids are NOT DETECTED.  The SARS-CoV-2 RNA is generally detectable in upper respiratory specimens during the acute phase of infection. The lowest concentration of SARS-CoV-2 viral copies this assay can detect is 138 copies/mL. A negative result does not preclude SARS-Cov-2 infection and should not be used as the sole basis for treatment or other patient management decisions. A negative result may occur with  improper specimen collection/handling, submission of specimen other than nasopharyngeal swab, presence of viral mutation(s) within the areas targeted by this assay, and inadequate number of viral copies(<138 copies/mL). A negative result must be combined with clinical observations, patient history, and epidemiological information. The expected result is Negative.  Fact Sheet for Patients:  EntrepreneurPulse.com.au  Fact Sheet for Healthcare Providers:  IncredibleEmployment.be  This test is no t  yet approved or cleared by the [place] and  has been authorized for detection and/or diagnosis of SARS-CoV-2 by FDA under an Emergency Use Authorization (EUA). This EUA will remain  in effect (meaning this test can be used) for the duration of the COVID-19 declaration under Section 564(b)(1) of the Act, 21 U.S.C.section 360bbb-3(b)(1), unless the authorization is terminated  or revoked sooner.       Influenza A by PCR NEGATIVE NEGATIVE   Influenza B by PCR NEGATIVE NEGATIVE    Comment: (NOTE) The Xpert Xpress SARS-CoV-2/FLU/RSV plus assay is intended as an aid in the diagnosis of influenza from Nasopharyngeal swab specimens and should not be used as a sole basis for treatment. Nasal washings  and aspirates are unacceptable for Xpert Xpress SARS-CoV-2/FLU/RSV testing.  Fact Sheet for Patients: EntrepreneurPulse.com.au  Fact Sheet for Healthcare Providers: IncredibleEmployment.be  This test is not yet approved or cleared by the [place] FDA and has been authorized for detection and/or diagnosis of SARS-CoV-2 by FDA under an Emergency Use Authorization (EUA). This EUA will remain in effect (meaning this test can be used) for the duration of the COVID-19 declaration under Section 564(b)(1) of the Act, 21 U.S.C. section 360bbb-3(b)(1), unless the authorization is terminated or revoked.  Performed at [facility], [address], [place], [place] [address]   Urinalysis, Routine w reflex microscopic Urine, In & Out Cath     Status: Abnormal   Collection Time: 03/12/21  8:57 PM  Result Value Ref Range   Color, Urine ORANGE (A) YELLOW    Comment: BIOCHEMICALS MAY BE AFFECTED BY COLOR   APPearance CLEAR CLEAR   Specific Gravity, Urine 1.029 1.005 - 1.030   pH 5.5 5.0 - 8.0   Glucose, UA NEGATIVE NEGATIVE mg/dL   Hgb urine dipstick TRACE (A) NEGATIVE   Bilirubin Urine SMALL (A) NEGATIVE   Ketones, ur 40 (A) NEGATIVE mg/dL   Protein, ur 30 (A) NEGATIVE mg/dL   Nitrite NEGATIVE NEGATIVE   Leukocytes,Ua NEGATIVE NEGATIVE   RBC / HPF 0-5 0 - 5 RBC/hpf   WBC, UA 0-5 0 - 5 WBC/hpf   Bacteria, UA RARE (A) NONE SEEN   Squamous Epithelial / LPF 0-5 0 - 5   Mucus PRESENT     Comment: Performed at [facility], [address], [place], [place] [address]  Troponin I (High Sensitivity)     Status: Abnormal   Collection Time: 03/12/21 10:36 PM  Result Value Ref Range   Troponin I (High Sensitivity) 37 (H) <18 ng/L    Comment: (NOTE) Elevated high sensitivity troponin I (hsTnI) values and significant  changes across serial measurements may suggest ACS but many other  chronic and acute conditions are  known to elevate hsTnI results.  Refer to the "Links" section for chest pain algorithms and additional  guidance. Performed at [facility], [address] [place], [place] [address]   I-Stat venous blood gas, [facility] ED)     Status: Abnormal   Collection Time: 03/12/21 10:52 PM  Result Value Ref Range   pH, Ven 7.501 (H) 7.250 - 7.430   pCO2, Ven 35.2 (L) 44.0 - 60.0 mmHg   pO2, Ven 39.0 32.0 - 45.0 mmHg   Bicarbonate 27.5 20.0 - 28.0 mmol/L   TCO2 29 22 - 32 mmol/L   O2 Saturation 79.0 %   Acid-Base Excess 4.0 (H) 0.0 - 2.0 mmol/L   Sodium 135 135 - 145 mmol/L   Potassium 3.7 3.5 - 5.1  mmol/L   Calcium, Ion 1.12 (L) 1.15 - 1.40 mmol/L   HCT 45.0 36.0 - 46.0 %   Hemoglobin 15.3 (H) 12.0 - 15.0 g/dL   Patient temperature 98.6 F    Collection site IV start    Drawn by Nurse    Sample type VENOUS    Comment NOTIFIED PHYSICIAN   CBC     Status: Abnormal   Collection Time: 03/13/21  6:18 AM  Result Value Ref Range   WBC 14.5 (H) 4.0 - 10.5 K/uL   RBC 5.09 3.87 - 5.11 MIL/uL   Hemoglobin 14.5 12.0 - 15.0 g/dL   HCT 44.8 36.0 - 46.0 %   MCV 88.0 80.0 - 100.0 fL   MCH 28.5 26.0 - 34.0 pg   MCHC 32.4 30.0 - 36.0 g/dL   RDW 17.5 (H) 11.5 - 15.5 %   Platelets 392 150 - 400 K/uL   nRBC 0.0 0.0 - 0.2 %    Comment: Performed at [facility], [place] [address] [place], [place] [address]   DG Ribs Unilateral W/Chest Left  Result Date: 03/12/2021 CLINICAL DATA:  Fall with left tarsal pain EXAM: LEFT RIBS AND CHEST - 3+ VIEW COMPARISON:  Chest CT 04/01/2015, chest x-ray 04/17/2014 FINDINGS: Single view chest demonstrates diffuse coarse reticular opacity consistent with emphysema and chronic interstitial lung disease, findings appear worse as compared with prior radiograph. Possible small left-sided pleural effusion. Moderate hiatal hernia. Clips from in the left axilla and at the chest wall. Left rib series demonstrates acute left fourth fifth and sixth rib fractures.  IMPRESSION: 1. Acute left fourth through sixth rib fractures. No visible pneumothorax. 2. Diffuse coarse interstitial reticular opacity consistent with emphysema and probable chronic lung disease though difficult to exclude acute superimposed interstitial process. Small left effusion. 3. Hiatal hernia Electronically Signed   By: Donavan Foil M.D.   On: 03/12/2021 20:18   CT Head Wo Contrast  Result Date: 03/12/2021 CLINICAL DATA:  Head trauma, minor (Age >= 65y).  Fall. EXAM: CT HEAD WITHOUT CONTRAST TECHNIQUE: Contiguous axial images were obtained from the base of the skull through the vertex without intravenous contrast. RADIATION DOSE REDUCTION: This exam was performed according to the departmental dose-optimization program which includes automated exposure control, adjustment of the mA and/or kV according to patient size and/or use of iterative reconstruction technique. COMPARISON:  05/10/2016 FINDINGS: Brain: Mild age related volume loss. No acute intracranial abnormality. Specifically, no hemorrhage, hydrocephalus, mass lesion, acute infarction, or significant intracranial injury. Vascular: No hyperdense vessel or unexpected calcification. Skull: No acute calvarial abnormality. Sinuses/Orbits: No acute findings Other: None IMPRESSION: No acute intracranial abnormality. Electronically Signed   By: Rolm Baptise M.D.   On: 03/12/2021 21:27   CT Angio Chest PE W and/or Wo Contrast  Result Date: 03/12/2021 CLINICAL DATA:  Pulmonary embolism (PE) suspected, high prob Fall on left side after tripping over oxygen.  Left-sided pain. EXAM: CT ANGIOGRAPHY CHEST WITH CONTRAST TECHNIQUE: Multidetector CT imaging of the chest was performed using the standard protocol during bolus administration of intravenous contrast. Multiplanar CT image reconstructions and MIPs were obtained to evaluate the vascular anatomy. RADIATION DOSE REDUCTION: This exam was performed according to the departmental dose-optimization program  which includes automated exposure control, adjustment of the mA and/or kV according to patient size and/or use of iterative reconstruction technique. CONTRAST:  39mL OMNIPAQUE IOHEXOL 350 MG/ML SOLN COMPARISON:  Rib radiographs earlier today. FINDINGS: Cardiovascular: Subsegmental embolus in the anterior left upper lobe, series 6,  image 99. No additional pulmonary emboli. Dilated main pulmonary artery at 3.4 cm. Atherosclerotic tortuous thoracic aorta. There are coronary artery calcifications. Cardiomegaly with right heart dilatation. Contrast refluxes into the hepatic veins and IVC. No pericardial effusion. Mediastinum/Nodes: No mediastinal hemorrhage or hematoma. Scattered mediastinal lymph nodes, likely reactive. Prominent right hilar node. Moderate-sized hiatal hernia. Lungs/Pleura: Advanced emphysema and cystic lung disease. Significant progression from 2017 exam. Moderate left pleural effusion with adjacent atelectasis. No pneumothorax. Upper Abdomen: No free fluid. Contrast refluxes into the hepatic veins and IVC. Musculoskeletal: Left lateral fifth, 6, and seventh rib fractures, mildly displaced. There are multiple posterior rib fractures that have callus formation and are remote. Chronic and unchanged compression fractures of T7, T8, T10, and T12. T3 superior endplate compression fracture is new from 2017 exam, but age indeterminate. Surgical clips in both breasts. No confluent chest wall soft tissue hematoma. Review of the MIP images confirms the above findings. IMPRESSION: 1. Subsegmental embolus in the anterior left upper lobe. 2. Left lateral fifth, sixth, and seventh rib fractures, mildly displaced. 3. Moderate left pleural effusion with adjacent atelectasis. No pneumothorax. 4. Advanced chronic lung disease with emphysema and diffuse pulmonary cystic change. Definite progression from 2017 CT. As clinically indicated after resolution of acute event, consider high-resolution chest CT. 5. Dilated main  pulmonary artery consistent with pulmonary arterial hypertension. 6. Contrast refluxes into the hepatic veins and IVC, consistent with elevated right heart pressures. 7. T3 superior endplate compression fracture is new from 2017 exam, but age indeterminate. Multiple additional chronic compression fractures throughout the thoracic spine are unchanged. Aortic Atherosclerosis (ICD10-I70.0) and Emphysema (ICD10-J43.9). These results were called by telephone at the time of interpretation on 03/12/2021 at 11:37 pm to provider Bero, who verbally acknowledged these results. Electronically Signed   By: Keith Rake M.D.   On: 03/12/2021 23:39   CT Cervical Spine Wo Contrast  Result Date: 03/12/2021 CLINICAL DATA:  fall w/ head injury EXAM: CT CERVICAL SPINE WITHOUT CONTRAST TECHNIQUE: Multidetector CT imaging of the cervical spine was performed without intravenous contrast. Multiplanar CT image reconstructions were also generated. RADIATION DOSE REDUCTION: This exam was performed according to the departmental dose-optimization program which includes automated exposure control, adjustment of the mA and/or kV according to patient size and/or use of iterative reconstruction technique. COMPARISON:  None. FINDINGS: Alignment: No subluxation. Skull base and vertebrae: No acute fracture. No primary bone lesion or focal pathologic process. Soft tissues and spinal canal: No prevertebral fluid or swelling. No visible canal hematoma. Disc levels:  Diffuse degenerative disc disease and facet disease. Upper chest: Cystic spaces within the upper lobes, progressing since prior study, presumably progressive bronchiectasis and emphysema. No acute findings. Other: None IMPRESSION: Diffuse degenerative disc and facet disease. No acute bony abnormality. Electronically Signed   By: Rolm Baptise M.D.   On: 03/12/2021 21:29    Anti-infectives (From admission, onward)    None       Assessment/Plan GLF Left 5-7 rib fractures with  moderate left pleural effusion - multimodal pain control, pulm toilet, therapies, am cxr T3 superior endplate fracture - NSGY consulted, Dr. Kathyrn Sheriff. Recommends no bracing.  She does have some very mild subjective left sided abdominal tenderness that she cannot tell if is coming from her back. No peritonitis. She has been tolerating po at home. Discussed with MD. No indication for further imaging at this time. Will follow exam.   - Per [facility] -  LUL PE - on heparin gtt.  Elevated Tn  COPD on  home o2  HTN HLD Hypothyroidism DM2 Fibromyalgia Remote hx of BC s/p lumpectomy/chemo/radiation  FEN - Reg, IVF per [facility] VTE - SCDs, heparin gtt  ID - None Foley - None  Moderate Medical Decision Making  Jillyn Ledger, [facility] 03/13/2021, 8:25 AM Please see Amion for pager number during day hours 7:00am-4:30pm

## 2021-03-13 NOTE — Assessment & Plan Note (Signed)
-  Continue HCTZ and ASA -Will cover with prn IV hydralazine

## 2021-03-13 NOTE — TOC CAGE-AID Note (Signed)
Transition of Care [facility]) - CAGE-AID Screening   Patient Details  Name: [name] MRN: [id number] Date of Birth: 1934-10-02  Transition of Care [facility]) CM/SW Contact:    Otho Michalik C Tarpley-Carter, [place] Phone Number: 03/13/2021, 11:40 AM   Clinical Narrative: Pt participated in [place].  Pt stated she does not use substance or ETOH.  Pt has hx of smoking and a social drinker.  Pt was not offered resources, due to no usage of substance or ETOH.     [name], MSW, LCSW-A Pronouns:  She/Her/Hers [place] Transitions of Care Clinical Social Worker Direct Number:  [phone] [name]  CAGE-AID Screening:    Have You Ever Felt You Ought to Cut Down on Your Drinking or Drug Use?: No Have People Annoyed You By [employer] Your Drinking Or Drug Use?: No Have You Felt Bad Or Guilty About Your Drinking Or Drug Use?: No Have You Ever Had a Drink or Used Drugs First Thing In The Morning to Steady Your Nerves or to Get Rid of a Hangover?: No CAGE-AID Score: 0  Substance Abuse Education Offered: No (Smoking hx and occassional drink of wine.)

## 2021-03-13 NOTE — Plan of Care (Signed)
°  Problem: Education: Goal: Knowledge of General Education information will improve Description: Including pain rating scale, medication(s)/side effects and non-pharmacologic comfort measures Outcome: Progressing   Problem: Health Behavior/Discharge Planning: Goal: Ability to manage health-related needs will improve Outcome: Progressing   Problem: Clinical Measurements: Goal: Ability to maintain clinical measurements within normal limits will improve Outcome: Progressing Goal: Will remain free from infection Outcome: Progressing Goal: Diagnostic test results will improve Outcome: Progressing   Problem: Nutrition: Goal: Adequate nutrition will be maintained Outcome: Progressing   

## 2021-03-13 NOTE — Discharge Instructions (Addendum)
RD Recommendations: Imodium to help treat diarrhea, Bananatrol as a daily supplement, Unjury unflavored protein powder to help boost protein status  Need to replenish electrolytes when having long-lasting diarrhea spells at home by using products such as pedialyte  Suggestions For Increasing Calories And Protein Several small meals a day are easier to eat and digest than three large ones. Space meals about 2 to 3 hours apart to maximize comfort. Stop eating 2 to 3 hours before bed and sleep with your head elevated if gastric reflux (GERD) and heartburn are problems. Do not eat your favorite foods if you are feeling bad. Save them for when you feel good! Eat breakfast-type foods at any meal. Eggs are usually easy to eat and are great any time of the day. (The same goes for pancakes and waffles.) Eat when you feel hungry. Most people have the greatest appetite in the morning because they have not eaten all night. If this is the best meal for you, then pile on those calories and other nutrients in the morning and at lunch. Then you can have a smaller dinner without losing total calories for the day. Eat leftovers or nutritious snacks in the afternoon and early evening to round out your day. Try homemade or commercially prepared nutrition bars and puddings, as well as calorie- and protein-rich liquid nutritional supplements. Benefits of Physical Activity Talk to your doctor about physical activity. Light or moderate physical activity can help maintain muscle and promote an appetite. Walking in the neighborhood or the local mall is a great way to get up, get out, and get moving. If you are unsteady on your feet, try walking around the dining room table. Save Room for [employer]! Drink most fluids between meals instead of with meals. (It is fine to have a sip to help swallow food at meal time.) Fluids (which usually have fewer calories and nutrients than solid food) can take up valuable space in your  stomach.  Foods Recommended High-Protein Foods Milk products Add cheese to toast, crackers, sandwiches, baked potatoes, vegetables, soups, noodles, meat, and fruit. Use reduced-fat (2%) or whole milk in place of water when cooking cereal and cream soups. Include cream sauces on vegetables and pasta. Add powdered milk to cream soups and mashed potatoes.  Eggs Have hard-cooked eggs readily available in the refrigerator. Chop and add to salads, casseroles, soups, and vegetables. Make a quick egg salad. All eggs should be well cooked to avoid the risk of harmful bacteria.  Meats, poultry, and fish Add leftover cooked meats to soups, casseroles, salads, and omelets. Make dip by mixing diced, chopped, or shredded meat with sour cream and spices.  Beans, legumes, nuts, and seeds Sprinkle nuts and seeds on cereals, fruit, and desserts such as ice cream, pudding, and custard. Also serve nuts and seeds on vegetables, salads, and pasta. Spread peanut butter on toast, bread, English muffins, and fruit, or blend it in a milk shake. Add beans and peas to salads, soups, casseroles, and vegetable dishes.  High-Calorie Foods Butter, margarine, and  oils Melt butter or margarine over potatoes, rice, pasta, and cooked vegetables. Add melted butter or margarine into soups and casseroles and spread on bread for sandwiches before spreading sandwich spread or peanut butter. Saut or stir-fry vegetables, meats, chicken and fish such as shrimp/scallops in olive or canola oil. A variety of oils add calories and can be used to [employer], chicken, or fish.  Milk products Add whipping cream to desserts, pancakes, waffles, fruit, and  hot chocolate, and fold it into soups and casseroles. Add sour cream to baked potatoes and vegetables.  Salad dressing Use regular (not low-fat or diet) mayonnaise and salad dressing on sandwiches and in dips with vegetables and fruit.   Sweets Add jelly and honey to bread and crackers. Add jam to  fruit and ice cream and as a topping over cake.   Copyright 2020  Academy of [employer] Dietetics. All rights reserved.   Additional discharge instructions  Please get your medications reviewed and adjusted by your Primary MD.  Please request your Primary MD to go over all Hospital Tests and Procedure/Radiological results at the follow up, please get all Hospital records sent to your Prim MD by signing hospital release before you go home.  If you had Pneumonia of Lung problems at the Hospital: Please get a 2 view Chest X ray done in 6-8 weeks after hospital discharge or sooner if instructed by your Primary MD.  If you have Congestive Heart Failure: Please call your Cardiologist or Primary MD anytime you have any of the following symptoms:  1) 3 pound weight gain in 24 hours or 5 pounds in 1 week  2) shortness of breath, with or without a dry hacking cough  3) swelling in the hands, feet or stomach  4) if you have to sleep on extra pillows at night in order to breathe  Follow cardiac low salt diet and 1.5 lit/day fluid restriction.  If you have diabetes Accuchecks 4 times/day, Once in AM empty stomach and then before each meal. Log in all results and show them to your primary doctor at your next visit. If any glucose reading is under 80 or above 300 call your primary MD immediately.  If you have Seizure/Convulsions/Epilepsy: Please do not drive, operate heavy machinery, participate in activities at heights or participate in high speed sports until you have seen by Primary MD or a Neurologist and advised to do so again.  If you had Gastrointestinal Bleeding: Please ask your Primary MD to check a complete blood count within one week of discharge or at your next visit. Your endoscopic/colonoscopic biopsies that are pending at the time of discharge, will also need to followed by your Primary MD.  Get Medicines reviewed and adjusted. Please take all your medications with you for your  next visit with your Primary MD  Please request your Primary MD to go over all hospital tests and procedure/radiological results at the follow up, please ask your Primary MD to get all Hospital records sent to his/her office.  If you experience worsening of your admission symptoms, develop shortness of breath, life threatening emergency, suicidal or homicidal thoughts you must seek medical attention immediately by calling 911 or calling your MD immediately  if symptoms less severe.  You must read complete instructions/literature along with all the possible adverse reactions/side effects for all the Medicines you take and that have been prescribed to you. Take any new Medicines after you have completely understood and accpet all the possible adverse reactions/side effects.   Do not drive or operate heavy machinery when taking Pain medications.   Do not take more than prescribed Pain, Sleep and Anxiety Medications  Special Instructions: If you have smoked or chewed Tobacco  in the last 2 yrs please stop smoking, stop any regular Alcohol  and or any Recreational drug use.  Wear Seat belts while driving.  Please note You were cared for by a hospitalist during your hospital stay. If you  have any questions about your discharge medications or the care you received while you were in the hospital after you are discharged, you can call the unit and asked to speak with the hospitalist on call if the hospitalist that took care of you is not available. Once you are discharged, your primary care physician will handle any further medical issues. Please note that NO REFILLS for any discharge medications will be authorized once you are discharged, as it is imperative that you return to your primary care physician (or establish a relationship with a primary care physician if you do not have one) for your aftercare needs so that they can reassess your need for medications and monitor your lab values.  You can reach  the hospitalist office at phone [phone] or fax [phone]   If you do not have a primary care physician, you can call [phone] for a physician referral.

## 2021-03-14 ENCOUNTER — Encounter (HOSPITAL_COMMUNITY): Payer: Self-pay | Admitting: Internal Medicine

## 2021-03-14 ENCOUNTER — Inpatient Hospital Stay (HOSPITAL_COMMUNITY): Payer: PPO

## 2021-03-14 DIAGNOSIS — J449 Chronic obstructive pulmonary disease, unspecified: Secondary | ICD-10-CM

## 2021-03-14 DIAGNOSIS — I10 Essential (primary) hypertension: Secondary | ICD-10-CM

## 2021-03-14 DIAGNOSIS — E785 Hyperlipidemia, unspecified: Secondary | ICD-10-CM

## 2021-03-14 DIAGNOSIS — N179 Acute kidney failure, unspecified: Secondary | ICD-10-CM

## 2021-03-14 LAB — MAGNESIUM: Magnesium: 1.7 mg/dL (ref 1.7–2.4)

## 2021-03-14 LAB — FERRITIN: Ferritin: 138 ng/mL (ref 11–307)

## 2021-03-14 LAB — CBC
HCT: 43.9 % (ref 36.0–46.0)
Hemoglobin: 14.2 g/dL (ref 12.0–15.0)
MCH: 28.2 pg (ref 26.0–34.0)
MCHC: 32.3 g/dL (ref 30.0–36.0)
MCV: 87.3 fL (ref 80.0–100.0)
Platelets: 453 10*3/uL — ABNORMAL HIGH (ref 150–400)
RBC: 5.03 MIL/uL (ref 3.87–5.11)
RDW: 17.5 % — ABNORMAL HIGH (ref 11.5–15.5)
WBC: 12.3 10*3/uL — ABNORMAL HIGH (ref 4.0–10.5)
nRBC: 0 % (ref 0.0–0.2)

## 2021-03-14 LAB — BASIC METABOLIC PANEL
Anion gap: 14 (ref 5–15)
BUN: 36 mg/dL — ABNORMAL HIGH (ref 8–23)
CO2: 27 mmol/L (ref 22–32)
Calcium: 8.8 mg/dL — ABNORMAL LOW (ref 8.9–10.3)
Chloride: 93 mmol/L — ABNORMAL LOW (ref 98–111)
Creatinine, Ser: 1.25 mg/dL — ABNORMAL HIGH (ref 0.44–1.00)
GFR, Estimated: 43 mL/min — ABNORMAL LOW (ref >60)
Glucose, Bld: 111 mg/dL — ABNORMAL HIGH (ref 70–99)
Potassium: 3.2 mmol/L — ABNORMAL LOW (ref 3.5–5.1)
Sodium: 134 mmol/L — ABNORMAL LOW (ref 135–145)

## 2021-03-14 LAB — VITAMIN D 25 HYDROXY (VIT D DEFICIENCY, FRACTURES): Vit D, 25-Hydroxy: 30.94 ng/mL (ref 30–100)

## 2021-03-14 LAB — VITAMIN B12: Vitamin B-12: 173 pg/mL — ABNORMAL LOW (ref 180–914)

## 2021-03-14 LAB — FOLATE: Folate: 16.1 ng/mL (ref >5.9)

## 2021-03-14 MED ORDER — POTASSIUM CHLORIDE CRYS ER 20 MEQ PO TBCR
40.0000 meq | EXTENDED_RELEASE_TABLET | Freq: Once | ORAL | Status: AC
  Administered 2021-03-14: 40 meq via ORAL
  Filled 2021-03-14: qty 2

## 2021-03-14 MED ORDER — RIVAROXABAN 15 MG PO TABS
15.0000 mg | ORAL_TABLET | Freq: Two times a day (BID) | ORAL | Status: DC
  Filled 2021-03-14 (×2): qty 1

## 2021-03-14 MED ORDER — ACETAMINOPHEN 500 MG PO TABS
1000.0000 mg | ORAL_TABLET | Freq: Three times a day (TID) | ORAL | Status: DC
  Administered 2021-03-14 – 2021-03-18 (×10): 1000 mg via ORAL
  Filled 2021-03-14 (×10): qty 2

## 2021-03-14 MED ORDER — ENOXAPARIN SODIUM 60 MG/0.6ML IJ SOSY: 60.0000 mg | PREFILLED_SYRINGE | INTRAMUSCULAR | Status: DC

## 2021-03-14 MED ORDER — RIVAROXABAN 20 MG PO TABS: 20.0000 mg | ORAL_TABLET | Freq: Every day | ORAL | Status: DC

## 2021-03-14 MED ORDER — MAGNESIUM SULFATE 2 GM/50ML IV SOLN
2.0000 g | Freq: Once | INTRAVENOUS | Status: AC
  Administered 2021-03-14: 2 g via INTRAVENOUS
  Filled 2021-03-14: qty 50

## 2021-03-14 MED ORDER — POTASSIUM CHLORIDE CRYS ER 20 MEQ PO TBCR
40.0000 meq | EXTENDED_RELEASE_TABLET | Freq: Once | ORAL | Status: AC
Start: 2021-03-14 — End: 2021-03-14
  Administered 2021-03-14: 40 meq via ORAL
  Filled 2021-03-14: qty 2

## 2021-03-14 MED ORDER — OXYCODONE HCL 5 MG PO TABS
2.5000 mg | ORAL_TABLET | Freq: Three times a day (TID) | ORAL | Status: DC | PRN
  Administered 2021-03-15 – 2021-03-16 (×3): 2.5 mg via ORAL
  Filled 2021-03-14 (×3): qty 1

## 2021-03-14 NOTE — Progress Notes (Signed)
ANTICOAGULATION CONSULT NOTE - Initial Consult  Pharmacy Consult for Heparin>lovenox Indication: pulmonary embolus  Allergies  Allergen Reactions   Penicillins Anaphylaxis   Adhesive [Tape] Other (See Comments)    Blisters    Morphine     Other reaction(s): Other (See Comments) HALLUCINATIONS   Doxycycline Nausea And Vomiting   Morphine And Related Other (See Comments)    HALLUCINATIONS    Patient Measurements: Height: 5\' 3"  (160 cm) Weight: 59.4 kg (130 lb 15.3 oz) IBW/kg (Calculated) : 52.4   Vital Signs: Temp: 97.4 F (36.3 C) (01/14 0743) Temp Source: Oral (01/14 0743) BP: 105/54 (01/14 0743) Pulse Rate: 73 (01/14 0743)  Labs: Recent Labs    03/12/21 2047 03/12/21 2236 03/12/21 2252 03/13/21 0618 03/13/21 0842 03/14/21 0219  HGB 14.3  --  15.3* 14.5  --  14.2  HCT 45.5  --  45.0 44.8  --  43.9  PLT 441*  --   --  392  --  453*  HEPARINUNFRC  --   --   --   --  <0.10*  --   CREATININE 0.81  --   --   --   --  1.25*  TROPONINIHS 30* 37*  --   --   --   --      Estimated Creatinine Clearance: 27.7 mL/min (A) (by C-G formula based on SCr of 1.25 mg/dL (H)).   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   Scr bumped to 1.25 today. CrCl~27.7 ml/min. Will adjust lovenox dose today.   Goal of Therapy:  Heparin level 0.3-0.7 units/ml Monitor platelets by anticoagulation protocol: Yes   Plan:  Change Lovenox 60mg  SQ qday F/u oral [facility] plan  Onnie Boer, PharmD, BCIDP, AAHIVP, CPP Infectious Disease Pharmacist 03/14/2021 1:37 PM

## 2021-03-14 NOTE — Progress Notes (Signed)
Patient ID: [name], female   DOB: 04-Aug-1934, 87 y.o.   MRN: [id number]      Subjective: Feels a little better ROS negative except as listed above. Objective: Vital signs in last 24 hours: Temp:  [97.4 F (36.3 C)-98.5 F (36.9 C)] 97.4 F (36.3 C) (01/14 0743) Pulse Rate:  [70-81] 73 (01/14 0743) Resp:  [12-19] 12 (01/14 0743) BP: (95-134)/(54-95) 105/54 (01/14 0743) SpO2:  [92 %-99 %] 98 % (01/14 0841) Last BM Date: 03/13/21  Intake/Output from previous day: 01/13 0701 - 01/14 0700 In: 65.9 [I.V.:65.9] Out: 1 [Stool:1] Intake/Output this shift: No intake/output data recorded.  General appearance: alert and cooperative Resp: few rales Chest wall: left sided chest wall tenderness Cardio: regular rate and rhythm GI: soft, NT  Lab Results: CBC  Recent Labs    03/13/21 0618 03/14/21 0219  WBC 14.5* 12.3*  HGB 14.5 14.2  HCT 44.8 43.9  PLT 392 453*   BMET Recent Labs    03/12/21 2047 03/12/21 2252 03/14/21 0219  NA 135 135 134*  K 3.9 3.7 3.2*  CL 96*  --  93*  CO2 26  --  27  GLUCOSE 105*  --  111*  BUN 25*  --  36*  CREATININE 0.81  --  1.25*  CALCIUM 8.7*  --  8.8*   PT/INR No results for input(s): LABPROT, INR in the last 72 hours. ABG Recent Labs    03/12/21 2252  HCO3 27.5    Studies/Results: DG Ribs Unilateral W/Chest Left  Result Date: 03/12/2021 CLINICAL DATA:  Fall with left tarsal pain EXAM: LEFT RIBS AND CHEST - 3+ VIEW COMPARISON:  Chest CT 04/01/2015, chest x-ray 04/17/2014 FINDINGS: Single view chest demonstrates diffuse coarse reticular opacity consistent with emphysema and chronic interstitial lung disease, findings appear worse as compared with prior radiograph. Possible small left-sided pleural effusion. Moderate hiatal hernia. Clips from in the left axilla and at the chest wall. Left rib series demonstrates acute left fourth fifth and sixth rib fractures. IMPRESSION: 1. Acute left fourth through sixth rib fractures. No  visible pneumothorax. 2. Diffuse coarse interstitial reticular opacity consistent with emphysema and probable chronic lung disease though difficult to exclude acute superimposed interstitial process. Small left effusion. 3. Hiatal hernia Electronically Signed   By: Donavan Foil M.D.   On: 03/12/2021 20:18   CT Head Wo Contrast  Result Date: 03/12/2021 CLINICAL DATA:  Head trauma, minor (Age >= 65y).  Fall. EXAM: CT HEAD WITHOUT CONTRAST TECHNIQUE: Contiguous axial images were obtained from the base of the skull through the vertex without intravenous contrast. RADIATION DOSE REDUCTION: This exam was performed according to the departmental dose-optimization program which includes automated exposure control, adjustment of the mA and/or kV according to patient size and/or use of iterative reconstruction technique. COMPARISON:  05/10/2016 FINDINGS: Brain: Mild age related volume loss. No acute intracranial abnormality. Specifically, no hemorrhage, hydrocephalus, mass lesion, acute infarction, or significant intracranial injury. Vascular: No hyperdense vessel or unexpected calcification. Skull: No acute calvarial abnormality. Sinuses/Orbits: No acute findings Other: None IMPRESSION: No acute intracranial abnormality. Electronically Signed   By: Rolm Baptise M.D.   On: 03/12/2021 21:27   CT Angio Chest PE W and/or Wo Contrast  Result Date: 03/12/2021 CLINICAL DATA:  Pulmonary embolism (PE) suspected, high prob Fall on left side after tripping over oxygen.  Left-sided pain. EXAM: CT ANGIOGRAPHY CHEST WITH CONTRAST TECHNIQUE: Multidetector CT imaging of the chest was performed using the standard protocol during bolus administration of intravenous  contrast. Multiplanar CT image reconstructions and MIPs were obtained to evaluate the vascular anatomy. RADIATION DOSE REDUCTION: This exam was performed according to the departmental dose-optimization program which includes automated exposure control, adjustment of the mA  and/or kV according to patient size and/or use of iterative reconstruction technique. CONTRAST:  13mL OMNIPAQUE IOHEXOL 350 MG/ML SOLN COMPARISON:  Rib radiographs earlier today. FINDINGS: Cardiovascular: Subsegmental embolus in the anterior left upper lobe, series 6, image 99. No additional pulmonary emboli. Dilated main pulmonary artery at 3.4 cm. Atherosclerotic tortuous thoracic aorta. There are coronary artery calcifications. Cardiomegaly with right heart dilatation. Contrast refluxes into the hepatic veins and IVC. No pericardial effusion. Mediastinum/Nodes: No mediastinal hemorrhage or hematoma. Scattered mediastinal lymph nodes, likely reactive. Prominent right hilar node. Moderate-sized hiatal hernia. Lungs/Pleura: Advanced emphysema and cystic lung disease. Significant progression from 2017 exam. Moderate left pleural effusion with adjacent atelectasis. No pneumothorax. Upper Abdomen: No free fluid. Contrast refluxes into the hepatic veins and IVC. Musculoskeletal: Left lateral fifth, 6, and seventh rib fractures, mildly displaced. There are multiple posterior rib fractures that have callus formation and are remote. Chronic and unchanged compression fractures of T7, T8, T10, and T12. T3 superior endplate compression fracture is new from 2017 exam, but age indeterminate. Surgical clips in both breasts. No confluent chest wall soft tissue hematoma. Review of the MIP images confirms the above findings. IMPRESSION: 1. Subsegmental embolus in the anterior left upper lobe. 2. Left lateral fifth, sixth, and seventh rib fractures, mildly displaced. 3. Moderate left pleural effusion with adjacent atelectasis. No pneumothorax. 4. Advanced chronic lung disease with emphysema and diffuse pulmonary cystic change. Definite progression from 2017 CT. As clinically indicated after resolution of acute event, consider high-resolution chest CT. 5. Dilated main pulmonary artery consistent with pulmonary arterial hypertension.  6. Contrast refluxes into the hepatic veins and IVC, consistent with elevated right heart pressures. 7. T3 superior endplate compression fracture is new from 2017 exam, but age indeterminate. Multiple additional chronic compression fractures throughout the thoracic spine are unchanged. Aortic Atherosclerosis (ICD10-I70.0) and Emphysema (ICD10-J43.9). These results were called by telephone at the time of interpretation on 03/12/2021 at 11:37 pm to provider Bero, who verbally acknowledged these results. Electronically Signed   By: Keith Rake M.D.   On: 03/12/2021 23:39   CT Cervical Spine Wo Contrast  Result Date: 03/12/2021 CLINICAL DATA:  fall w/ head injury EXAM: CT CERVICAL SPINE WITHOUT CONTRAST TECHNIQUE: Multidetector CT imaging of the cervical spine was performed without intravenous contrast. Multiplanar CT image reconstructions were also generated. RADIATION DOSE REDUCTION: This exam was performed according to the departmental dose-optimization program which includes automated exposure control, adjustment of the mA and/or kV according to patient size and/or use of iterative reconstruction technique. COMPARISON:  None. FINDINGS: Alignment: No subluxation. Skull base and vertebrae: No acute fracture. No primary bone lesion or focal pathologic process. Soft tissues and spinal canal: No prevertebral fluid or swelling. No visible canal hematoma. Disc levels:  Diffuse degenerative disc disease and facet disease. Upper chest: Cystic spaces within the upper lobes, progressing since prior study, presumably progressive bronchiectasis and emphysema. No acute findings. Other: None IMPRESSION: Diffuse degenerative disc and facet disease. No acute bony abnormality. Electronically Signed   By: Rolm Baptise M.D.   On: 03/12/2021 21:29   DG CHEST PORT 1 VIEW  Result Date: 03/14/2021 CLINICAL DATA:  Shortness of breath EXAM: PORTABLE CHEST 1 VIEW COMPARISON:  03/12/2021 FINDINGS: Cardiac size is within normal  limits. Diffuse increase in interstitial markings are  seen in both lungs. There is possible interval worsening. There is no focal consolidation. There is blunting of both lateral CP angles. There is no pneumothorax. Surgical clips are seen in the left chest wall. There is deformity in the the lateral aspect of left ribs consistent with fractures. IMPRESSION: There is diffuse increase in interstitial markings in both lungs suggesting pulmonary fibrosis. Interstitial markings in both lungs appear more prominent which may be due to differences in the techniques or suggest superimposed pulmonary edema or superimposed pneumonia. Small bilateral pleural effusions. Electronically Signed   By: Kelsey Picker M.D.   On: 03/14/2021 10:22    Anti-infectives: Anti-infectives (From admission, onward)    None       Assessment/Plan: GLF Left 5-7 rib fractures with moderate left pleural effusion - multimodal pain control, pulm toilet, therapies, CXR today very small B effusions T3 superior endplate fracture - NSGY consulted, Dr. Kathyrn Sheriff. Recommends no bracing.    - Per [facility] -  LUL PE - on heparin gtt.  Elevated Tn  COPD on home o2  HTN HLD Hypothyroidism DM2 Fibromyalgia Remote hx of BC s/p lumpectomy/chemo/radiation   FEN - Reg, IVF per [facility] VTE - SCDs, heparin gtt  ID - None Foley - None   Moderate Medical Decision Making   LOS: 1 day    Georganna Skeans, MD, MPH, FACS Trauma & General Surgery Use [email or web address] to contact on call provider  03/14/2021

## 2021-03-14 NOTE — Evaluation (Signed)
Physical Therapy Evaluation Patient Details Name: [name] MRN: [id number] DOB: 01-26-1935 Today's Date: 03/14/2021  History of Present Illness  Pt. is 87 yr old F admitted on 03/12/21 s/p fall with c/o L sided chest pain.  Imaging (+) for PE, L 5-7 rib fx, T3 compression fx. PMH: anemia, asthma, CHF, COPD, DM, FM, breast CA, DVT, PE, HTN, neuropathy, osteoporosis  Clinical Impression  Pt was previously mod I with use of 4WW prior to PE and fall resulting in L sided rib fx.  Currently, pt is requiring mod A to sit up EOB and is unable to maintain sitting balance.  Pt is primarily limited by generalized weakness and lethargy.  Pt demos dec activity tolerance, transfer difficulty, dec balance and dec LE strength and would benefit from skilled PT to address deficits indicated in initial eval.  Discussed potential need for rehab prior to return home and pt is agreeable.       Recommendations for follow up therapy are one component of a multi-disciplinary discharge planning process, led by the attending physician.  Recommendations may be updated based on patient status, additional functional criteria and insurance authorization.  Follow Up Recommendations Skilled nursing-short term rehab (<3 hours/day)    Assistance Recommended at Discharge Frequent or constant Supervision/Assistance  Patient can return home with the following  A lot of help with walking and/or transfers;A lot of help with bathing/dressing/bathroom;Assistance with cooking/housework;Assist for transportation;Direct supervision/assist for medications management;Help with stairs or ramp for entrance    Equipment Recommendations  (Need further assessment of OOB activity to determine AD needs.  Pt too lethargic at this time.)  Recommendations for Other Services       Functional Status Assessment Patient has had a recent decline in their functional status and demonstrates the ability to make significant improvements in function  in a reasonable and predictable amount of time.     Precautions / Restrictions Precautions Precautions: Fall Restrictions Weight Bearing Restrictions: No Other Position/Activity Restrictions: Age indeterminate compression fx on imaging, per ortho no brace needed      Mobility  Bed Mobility Overal bed mobility: Needs Assistance Bed Mobility: Supine to Sit     Supine to sit: Mod assist     General bed mobility comments: Pt. req's mod A for B LE negotiation, use of chuck pad to scoot pelvis and HHA with R UE to transition to EOB.  Demos poor sitting balance with lateral lean.  Unable to correct and req's physical assist to maintain sitting. States she is tired and wants to lay back down.  Mod A to pivot LEs BTB and max A with chuck pad to scoot up in bed. Patient Response: Flat affect  Transfers                        Ambulation/Gait                  Stairs            Wheelchair Mobility    Modified Rankin (Stroke Patients Only)       Balance Overall balance assessment: Needs assistance;History of Falls Sitting-balance support: Feet supported;Bilateral upper extremity supported Sitting balance-Leahy Scale: Poor   Postural control: Posterior lean                                   Pertinent Vitals/Pain Pain Assessment: Faces Faces Pain Scale: Hurts  a little bit Pain Location: L side Pain Descriptors / Indicators: Grimacing;Guarding;Tender Pain Intervention(s): Limited activity within patient's tolerance;Monitored during session    Home Living Family/patient expects to be discharged to:: Private residence Living Arrangements: Children Available Help at Discharge: Family;Available PRN/intermittently Type of Home: House Home Access: Stairs to enter Entrance Stairs-Rails: None Entrance Stairs-Number of Steps: 3   Home Layout: One level Home Equipment: Rollator (4 wheels)      Prior Function Prior Level of Function :  Independent/Modified Independent                     Hand Dominance        Extremity/Trunk Assessment   Upper Extremity Assessment Upper Extremity Assessment: Defer to OT evaluation    Lower Extremity Assessment Lower Extremity Assessment: Generalized weakness       Communication   Communication:  (Lethargic, often falls asleep during questioning and responds only with head shakes yes and no)  Cognition Arousal/Alertness: Lethargic Behavior During Therapy: Flat affect Overall Cognitive Status: Impaired/Different from baseline Area of Impairment: Orientation                 Orientation Level: Place;Time             General Comments: Supine in bed and very lethargic. Friend is present in room.  Pt. arouses intermittently but easily drifts back to sleep.  Thinks she's at home.  Agreeable to try sitting up EOB with inc encouragement.        General Comments      Exercises     Assessment/Plan    PT Assessment Patient needs continued PT services  PT Problem List Decreased strength;Decreased mobility;Decreased activity tolerance;Decreased balance;Pain       PT Treatment Interventions DME instruction;Therapeutic exercise;Gait training;Balance training;Stair training;Functional mobility training;Therapeutic activities;Patient/family education    PT Goals (Current goals can be found in the Care Plan section)  Acute Rehab PT Goals Patient Stated Goal: Unable to state goal at this time. PT Goal Formulation: With patient Time For Goal Achievement: 03/28/21    Frequency Min 2X/week     Co-evaluation               AM-PAC PT "6 Clicks" Mobility  Outcome Measure Help needed turning from your back to your side while in a flat bed without using bedrails?: A Lot Help needed moving from lying on your back to sitting on the side of a flat bed without using bedrails?: A Lot Help needed moving to and from a bed to a chair (including a wheelchair)?: A  Lot Help needed standing up from a chair using your arms (e.g., wheelchair or bedside chair)?: A Lot Help needed to walk in hospital room?: A Lot Help needed climbing 3-5 steps with a railing? : Total 6 Click Score: 11    End of Session Equipment Utilized During Treatment: Gait belt Activity Tolerance: Patient limited by fatigue Patient left: in bed;with bed alarm set;with call bell/phone within reach;with family/visitor present Nurse Communication: Mobility status PT Visit Diagnosis: Other abnormalities of gait and mobility (R26.89);Muscle weakness (generalized) (M62.81);History of falling (Z91.81)    Time: 7616-0737 PT Time Calculation (min) (ACUTE ONLY): 14 min   Charges:   PT Evaluation $PT Eval Low Complexity: 1 Low          Montgomery Rothlisberger A. Jaman Aro, PT, DPT Acute Rehabilitation Services Office: [place] 03/14/2021, 2:18 PM

## 2021-03-14 NOTE — Progress Notes (Signed)
Notified by CCMD, pt had 5 beat run non sustained Vtach. VS stable, pt asymptomatic.  Potassium 3.2. [place], NP notified. No new orders at this time

## 2021-03-14 NOTE — Progress Notes (Addendum)
M PROGRESS NOTE   [name] [facility]  MLY:[id number]    DOB: 1934-07-06    DOA: 03/12/2021  PCP: Reynold Bowen, MD   I have briefly reviewed patients previous medical records in [facility].  Chief Complaint  Patient presents with   Fall    Brief Narrative:  87 year old female, son lives with her, ambulates with the help of a walker, medical history significant for breast cancer, chronic diastolic CHF, COPD on home oxygen 4 L/min, chronic respiratory failure with hypoxia, hypothyroidism, DM, fibromyalgia, DVT/PE, HTN and HLD presented to ED following a mechanical fall 3 days prior to admission and left-sided chest pain.  She slid and fell.  No LOC.  In ED, CTA chest showed left upper lobe PE and left-sided 3 rib fractures.  Admitted for acute on chronic respiratory failure with hypoxia, acute pulmonary embolism, fall with left-sided rib fractures.   Assessment & Plan:  Principal Problem:   Acute on chronic respiratory failure with hypoxia (HCC) Active Problems:   Dyslipidemia   Vertebral compression fracture (HCC)   Anxiety and depression   Hypothyroidism   Rib fractures   COPD, severe (HCC)   Diabetes mellitus type 2 in nonobese ([place])   Pulmonary embolism (HCC)   Essential hypertension   Acute on chronic respiratory failure with hypoxia: At baseline, on home oxygen 4 L/min.  Presented with oxygen saturation of 75% despite home oxygen.  Acute respiratory failure likely related to newly diagnosed PE and rib fracture related chest pain and poor inspiratory effort.  Treat as below.  Wean oxygen down to her baseline as tolerated.  Incentive spirometry and mobilize.  Acute pulmonary embolism: Patient denies prior history of VTE but these were listed on her chart.  Now with new PE.  Likely related to decreased mobility in the setting of fall and rib fractures.  No right heart strain on CT.  Initially initiated on IV heparin.  As per TOC inquiry, Eliquis with very high co-pay and hence  will start Xarelto, patient agreeable.  Left fifth sixth and seventh rib fractures: S/p fall.  Multimodality pain control with scheduled Tylenol for few days, Lidoderm patch and as needed opioids.  Incentive spirometry.  Trauma team on board.  Acute kidney injury: Creatinine up from 1.81-1.25.  Likely related to contrast.  HCTZ discontinued.  Follow BMP in a.m.  NSSVT: noted on tele 1/13. Check Echo, TSH. Replace low K and Mg. Continue tele  Severe COPD with chronic respiratory failure with hypoxia on 4 L/min home oxygen: No clinical exacerbation.  Continue Dulera, as needed albuterol, Flonase and Claritin.  T3 superior endplate compression fracture: New since 2017.  Per trauma team, neurosurgery consulted and recommended no bracing.  HTN: Discontinued HCTZ due to hyponatremia, hypokalemia, AKI.  As needed hydralazine.  Type II DM in nonobese: Recent A1c 5.3.  Hypothyroidism: Synthroid  Anxiety and depression: Continue Ativan.  No longer on Cymbalta.  Dyslipidemia: Continue statins.  S/p mechanical fall: PT and OT evaluation  Body mass index is 23.2 kg/m.  Nutritional Status Nutrition Problem: Severe Malnutrition Etiology: chronic illness (CHF, COPD, IBS) Signs/Symptoms: severe fat depletion, severe muscle depletion, energy intake < or equal to 75% for > or equal to 1 month Interventions: MVI, Prostat, Education, [employer], [name], Refer to RD note for recommendations   DVT prophylaxis:   Currently on IV heparin.  Will switch to Xarelto.   Code Status: DNR Family Communication: Discussed with daughter, updated care and answered all questions. Disposition:  Status is: Inpatient  Remains inpatient appropriate because: Severity of illness.        Consultants:   General surgery/trauma  Procedures:   None  Antimicrobials:   None   Subjective:  Ongoing left-sided chest pain, worse with movement.  Reports that her breathing is at her  baseline.  Decreased appetite.  Denies any other complaints.  Objective:   Vitals:   03/14/21 0613 03/14/21 0742 03/14/21 0743 03/14/21 0841  BP: (!) 98/54  (!) 105/54   Pulse: 70  73   Resp: 14  12   Temp:  (!) 97.4 F (36.3 C) (!) 97.4 F (36.3 C)   TempSrc:  Oral Oral   SpO2: 97%  96% 98%  Weight:      Height:        General exam: Elderly female, moderately built and frail, chronically ill looking, sitting up comfortably in bed without distress. Respiratory system: Poor inspiratory effort.  Distant breath sounds but clear to auscultation without wheezing, rhonchi or crackles.  Intermittent pursed lip breathing.  Reproducible left anterior lateral chest wall tenderness. Cardiovascular system: S1 & S2 heard, RRR. No JVD, murmurs, rubs, gallops or clicks. No pedal edema.  Telemetry personally reviewed: Sinus rhythm.  15 beat nonsustained SVT noted on 1/13 at around 12:45 PM. Gastrointestinal system: Abdomen is nondistended, soft and nontender. No organomegaly or masses felt. Normal bowel sounds heard. Central nervous system: Alert and oriented. No focal neurological deficits. Extremities: Symmetric 5 x 5 power. Skin: Bilateral mid anterior shin dressings, clean and dry. Psychiatry: Judgement and insight appear normal. Mood & affect appropriate.     Data Reviewed:   I have personally reviewed following labs and imaging studies   CBC: Recent Labs  Lab 03/12/21 2047 03/12/21 2252 03/13/21 0618 03/14/21 0219  WBC 15.7*  --  14.5* 12.3*  NEUTROABS 13.5*  --   --   --   HGB 14.3 15.3* 14.5 14.2  HCT 45.5 45.0 44.8 43.9  MCV 88.0  --  88.0 87.3  PLT 441*  --  392 453*    Basic Metabolic Panel: Recent Labs  Lab 03/12/21 2047 03/12/21 2252 03/14/21 0219 03/14/21 1212  NA 135 135 134*  --   K 3.9 3.7 3.2*  --   CL 96*  --  93*  --   CO2 26  --  27  --   GLUCOSE 105*  --  111*  --   BUN 25*  --  36*  --   CREATININE 0.81  --  1.25*  --   CALCIUM 8.7*  --  8.8*  --    MG  --   --   --  1.7    Liver Function Tests: Recent Labs  Lab 03/12/21 2047  AST 14*  ALT 5  ALKPHOS 68  BILITOT 2.0*  PROT 7.1  ALBUMIN 3.1*    CBG: No results for input(s): GLUCAP in the last 168 hours.  Microbiology Studies:   Recent Results (from the past 240 hour(s))  Resp Panel by RT-PCR (Flu A&B, Covid) Nasopharyngeal Swab     Status: None   Collection Time: 03/12/21  8:56 PM   Specimen: Nasopharyngeal Swab; Nasopharyngeal(NP) swabs in vial transport medium  Result Value Ref Range Status   SARS Coronavirus 2 by RT PCR NEGATIVE NEGATIVE Final    Comment: (NOTE) SARS-CoV-2 target nucleic acids are NOT DETECTED.  The SARS-CoV-2 RNA is generally detectable in upper respiratory specimens during the acute phase of infection. The lowest  concentration of SARS-CoV-2 viral copies this assay can detect is 138 copies/mL. A negative result does not preclude SARS-Cov-2 infection and should not be used as the sole basis for treatment or other patient management decisions. A negative result may occur with  improper specimen collection/handling, submission of specimen other than nasopharyngeal swab, presence of viral mutation(s) within the areas targeted by this assay, and inadequate number of viral copies(<138 copies/mL). A negative result must be combined with clinical observations, patient history, and epidemiological information. The expected result is Negative.  Fact Sheet for Patients:  EntrepreneurPulse.com.au  Fact Sheet for Healthcare Providers:  IncredibleEmployment.be  This test is no t yet approved or cleared by the [place] FDA and  has been authorized for detection and/or diagnosis of SARS-CoV-2 by FDA under an Emergency Use Authorization (EUA). This EUA will remain  in effect (meaning this test can be used) for the duration of the COVID-19 declaration under Section 564(b)(1) of the Act, 21 U.S.C.section  360bbb-3(b)(1), unless the authorization is terminated  or revoked sooner.       Influenza A by PCR NEGATIVE NEGATIVE Final   Influenza B by PCR NEGATIVE NEGATIVE Final    Comment: (NOTE) The Xpert Xpress SARS-CoV-2/FLU/RSV plus assay is intended as an aid in the diagnosis of influenza from Nasopharyngeal swab specimens and should not be used as a sole basis for treatment. Nasal washings and aspirates are unacceptable for Xpert Xpress SARS-CoV-2/FLU/RSV testing.  Fact Sheet for Patients: EntrepreneurPulse.com.au  Fact Sheet for Healthcare Providers: IncredibleEmployment.be  This test is not yet approved or cleared by the [place] FDA and has been authorized for detection and/or diagnosis of SARS-CoV-2 by FDA under an Emergency Use Authorization (EUA). This EUA will remain in effect (meaning this test can be used) for the duration of the COVID-19 declaration under Section 564(b)(1) of the Act, 21 U.S.C. section 360bbb-3(b)(1), unless the authorization is terminated or revoked.  Performed at [facility], [address] [place], [place] [address]     Radiology Studies:  DG Ribs Unilateral W/Chest Left  Result Date: 03/12/2021 CLINICAL DATA:  Fall with left tarsal pain EXAM: LEFT RIBS AND CHEST - 3+ VIEW COMPARISON:  Chest CT 04/01/2015, chest x-ray 04/17/2014 FINDINGS: Single view chest demonstrates diffuse coarse reticular opacity consistent with emphysema and chronic interstitial lung disease, findings appear worse as compared with prior radiograph. Possible small left-sided pleural effusion. Moderate hiatal hernia. Clips from in the left axilla and at the chest wall. Left rib series demonstrates acute left fourth fifth and sixth rib fractures. IMPRESSION: 1. Acute left fourth through sixth rib fractures. No visible pneumothorax. 2. Diffuse coarse interstitial reticular opacity consistent with emphysema and probable  chronic lung disease though difficult to exclude acute superimposed interstitial process. Small left effusion. 3. Hiatal hernia Electronically Signed   By: Donavan Foil M.D.   On: 03/12/2021 20:18   CT Head Wo Contrast  Result Date: 03/12/2021 CLINICAL DATA:  Head trauma, minor (Age >= 65y).  Fall. EXAM: CT HEAD WITHOUT CONTRAST TECHNIQUE: Contiguous axial images were obtained from the base of the skull through the vertex without intravenous contrast. RADIATION DOSE REDUCTION: This exam was performed according to the departmental dose-optimization program which includes automated exposure control, adjustment of the mA and/or kV according to patient size and/or use of iterative reconstruction technique. COMPARISON:  05/10/2016 FINDINGS: Brain: Mild age related volume loss. No acute intracranial abnormality. Specifically, no hemorrhage, hydrocephalus, mass lesion, acute infarction, or significant intracranial injury. Vascular: No hyperdense vessel  or unexpected calcification. Skull: No acute calvarial abnormality. Sinuses/Orbits: No acute findings Other: None IMPRESSION: No acute intracranial abnormality. Electronically Signed   By: Rolm Baptise M.D.   On: 03/12/2021 21:27   CT Angio Chest PE W and/or Wo Contrast  Result Date: 03/12/2021 CLINICAL DATA:  Pulmonary embolism (PE) suspected, high prob Fall on left side after tripping over oxygen.  Left-sided pain. EXAM: CT ANGIOGRAPHY CHEST WITH CONTRAST TECHNIQUE: Multidetector CT imaging of the chest was performed using the standard protocol during bolus administration of intravenous contrast. Multiplanar CT image reconstructions and MIPs were obtained to evaluate the vascular anatomy. RADIATION DOSE REDUCTION: This exam was performed according to the departmental dose-optimization program which includes automated exposure control, adjustment of the mA and/or kV according to patient size and/or use of iterative reconstruction technique. CONTRAST:  33mL  OMNIPAQUE IOHEXOL 350 MG/ML SOLN COMPARISON:  Rib radiographs earlier today. FINDINGS: Cardiovascular: Subsegmental embolus in the anterior left upper lobe, series 6, image 99. No additional pulmonary emboli. Dilated main pulmonary artery at 3.4 cm. Atherosclerotic tortuous thoracic aorta. There are coronary artery calcifications. Cardiomegaly with right heart dilatation. Contrast refluxes into the hepatic veins and IVC. No pericardial effusion. Mediastinum/Nodes: No mediastinal hemorrhage or hematoma. Scattered mediastinal lymph nodes, likely reactive. Prominent right hilar node. Moderate-sized hiatal hernia. Lungs/Pleura: Advanced emphysema and cystic lung disease. Significant progression from 2017 exam. Moderate left pleural effusion with adjacent atelectasis. No pneumothorax. Upper Abdomen: No free fluid. Contrast refluxes into the hepatic veins and IVC. Musculoskeletal: Left lateral fifth, 6, and seventh rib fractures, mildly displaced. There are multiple posterior rib fractures that have callus formation and are remote. Chronic and unchanged compression fractures of T7, T8, T10, and T12. T3 superior endplate compression fracture is new from 2017 exam, but age indeterminate. Surgical clips in both breasts. No confluent chest wall soft tissue hematoma. Review of the MIP images confirms the above findings. IMPRESSION: 1. Subsegmental embolus in the anterior left upper lobe. 2. Left lateral fifth, sixth, and seventh rib fractures, mildly displaced. 3. Moderate left pleural effusion with adjacent atelectasis. No pneumothorax. 4. Advanced chronic lung disease with emphysema and diffuse pulmonary cystic change. Definite progression from 2017 CT. As clinically indicated after resolution of acute event, consider high-resolution chest CT. 5. Dilated main pulmonary artery consistent with pulmonary arterial hypertension. 6. Contrast refluxes into the hepatic veins and IVC, consistent with elevated right heart pressures.  7. T3 superior endplate compression fracture is new from 2017 exam, but age indeterminate. Multiple additional chronic compression fractures throughout the thoracic spine are unchanged. Aortic Atherosclerosis (ICD10-I70.0) and Emphysema (ICD10-J43.9). These results were called by telephone at the time of interpretation on 03/12/2021 at 11:37 pm to provider Bero, who verbally acknowledged these results. Electronically Signed   By: Keith Rake M.D.   On: 03/12/2021 23:39   CT Cervical Spine Wo Contrast  Result Date: 03/12/2021 CLINICAL DATA:  fall w/ head injury EXAM: CT CERVICAL SPINE WITHOUT CONTRAST TECHNIQUE: Multidetector CT imaging of the cervical spine was performed without intravenous contrast. Multiplanar CT image reconstructions were also generated. RADIATION DOSE REDUCTION: This exam was performed according to the departmental dose-optimization program which includes automated exposure control, adjustment of the mA and/or kV according to patient size and/or use of iterative reconstruction technique. COMPARISON:  None. FINDINGS: Alignment: No subluxation. Skull base and vertebrae: No acute fracture. No primary bone lesion or focal pathologic process. Soft tissues and spinal canal: No prevertebral fluid or swelling. No visible canal hematoma. Disc levels:  Diffuse degenerative  disc disease and facet disease. Upper chest: Cystic spaces within the upper lobes, progressing since prior study, presumably progressive bronchiectasis and emphysema. No acute findings. Other: None IMPRESSION: Diffuse degenerative disc and facet disease. No acute bony abnormality. Electronically Signed   By: Rolm Baptise M.D.   On: 03/12/2021 21:29   DG CHEST PORT 1 VIEW  Result Date: 03/14/2021 CLINICAL DATA:  Shortness of breath EXAM: PORTABLE CHEST 1 VIEW COMPARISON:  03/12/2021 FINDINGS: Cardiac size is within normal limits. Diffuse increase in interstitial markings are seen in both lungs. There is possible interval  worsening. There is no focal consolidation. There is blunting of both lateral CP angles. There is no pneumothorax. Surgical clips are seen in the left chest wall. There is deformity in the the lateral aspect of left ribs consistent with fractures. IMPRESSION: There is diffuse increase in interstitial markings in both lungs suggesting pulmonary fibrosis. Interstitial markings in both lungs appear more prominent which may be due to differences in the techniques or suggest superimposed pulmonary edema or superimposed pneumonia. Small bilateral pleural effusions. Electronically Signed   By: Elmer Picker M.D.   On: 03/14/2021 10:22    Scheduled Meds:    (feeding supplement) PROSource Plus  30 mL Oral TID BM   acetaminophen  650 mg Oral Q6H   aspirin EC  81 mg Oral Daily   atorvastatin  40 mg Oral Daily   docusate sodium  100 mg Oral BID   [START ON 03/15/2021] enoxaparin (LOVENOX) injection  60 mg Subcutaneous Q24H   feeding supplement  1 Container Oral TID BM   fluticasone  2 spray Each Nare Daily   gabapentin  100 mg Oral TID   levothyroxine  88 mcg Oral QAC breakfast   lidocaine  1 patch Transdermal Q24H   loratadine  10 mg Oral Daily   mometasone-formoterol  2 puff Inhalation BID   multivitamin with minerals  1 tablet Oral Daily   pantoprazole  40 mg Oral Daily   sodium chloride flush  3 mL Intravenous Q12H    Continuous Infusions:     LOS: 1 day     Vernell Leep, MD,  FACP, [facility], [facility], [facility] (Care Management Physician Certified) [place]  To contact the attending provider between 7A-7P or the covering provider during after hours 7P-7A, please log into the web site [email or web address] and access using universal [place] password for that web site. If you do not have the password, please call the hospital operator.  03/14/2021, 1:49 PM

## 2021-03-14 NOTE — Progress Notes (Signed)
[place] for Heparin>lovenox > rivaroxaban Indication: pulmonary embolus  Allergies  Allergen Reactions   Penicillins Anaphylaxis   Adhesive [Tape] Other (See Comments)    Blisters    Morphine     Other reaction(s): Other (See Comments) HALLUCINATIONS   Doxycycline Nausea And Vomiting   Morphine And Related Other (See Comments)    HALLUCINATIONS    Patient Measurements: Height: 5\' 3"  (160 cm) Weight: 59.4 kg (130 lb 15.3 oz) IBW/kg (Calculated) : 52.4   Vital Signs: Temp: 97.4 F (36.3 C) (01/14 0743) Temp Source: Oral (01/14 0743) BP: 105/54 (01/14 0743) Pulse Rate: 73 (01/14 0743)  Labs: Recent Labs    03/12/21 2047 03/12/21 2236 03/12/21 2252 03/13/21 0618 03/13/21 0842 03/14/21 0219  HGB 14.3  --  15.3* 14.5  --  14.2  HCT 45.5  --  45.0 44.8  --  43.9  PLT 441*  --   --  392  --  453*  HEPARINUNFRC  --   --   --   --  <0.10*  --   CREATININE 0.81  --   --   --   --  1.25*  TROPONINIHS 30* 37*  --   --   --   --      Estimated Creatinine Clearance: 27.7 mL/min (A) (by C-G formula based on SCr of 1.25 mg/dL (H)).   Medical History: Past Medical History:  Diagnosis Date   Anemia    Asthma    CHF (congestive heart failure) (HCC)    COPD (chronic obstructive pulmonary disease) (HCC)    Diabetes mellitus without complication (HCC)    Fibromyalgia    History of breast cancer LEFT BREAST DCIS  1996   History of DVT (deep vein thrombosis)    History of pulmonary embolism    Hyperlipidemia    Hypertension    IBS (irritable bowel syndrome)    Lung mass PER DR CLANCE NOTE UNCLEAR IF LYMPHOMA FROM BX   FOLLOWED BY DR RUBIN   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   Osteoporosis    PONV (postoperative nausea and vomiting)    Vertebral compression fracture ([place]) 06/2012   L4     Assessment: 87 y/o F with subsegmental PE, pt has history of VTE no longer on anti-coagulants,  CBC/renal function is good. Pharmacy asked to transition to rivaroxaban. CrCl ~27, last dose of enoxaparin was today ~1200 so now rivaroxaban necessary today.   Goal of Therapy:  Heparin level 0.3-0.7 units/ml Monitor platelets by anticoagulation protocol: Yes   Plan:  Stop enoxaparin Xarelto 15mg  BID x21 days starting tomorrow ~1200 then 20mg  daily  Arrie Senate, PharmD, BCPS, [facility] Clinical Pharmacist [phone] Please check AMION for all [facility] Pharmacy numbers 03/14/2021

## 2021-03-14 NOTE — Progress Notes (Signed)
OT Cancellation Note  Patient Details Name: [name] MRN: [id number] DOB: 07-11-1934   Cancelled Treatment:    Reason Eval/Treat Not Completed: Fatigue/lethargy limiting ability to participate. RN states pt's medication has made her very lethargic and drowsy, requests therapy follow up later. Will reattempt as schedule allows.  Lynnda Child, OTD, OTR/L Acute Rehab [phone]   Kaylyn Lim 03/14/2021, 3:45 PM

## 2021-03-15 ENCOUNTER — Encounter (HOSPITAL_COMMUNITY): Payer: PPO

## 2021-03-15 DIAGNOSIS — S2242XD Multiple fractures of ribs, left side, subsequent encounter for fracture with routine healing: Secondary | ICD-10-CM

## 2021-03-15 LAB — BASIC METABOLIC PANEL
Anion gap: 9 (ref 5–15)
BUN: 44 mg/dL — ABNORMAL HIGH (ref 8–23)
CO2: 28 mmol/L (ref 22–32)
Calcium: 8.5 mg/dL — ABNORMAL LOW (ref 8.9–10.3)
Chloride: 96 mmol/L — ABNORMAL LOW (ref 98–111)
Creatinine, Ser: 1.3 mg/dL — ABNORMAL HIGH (ref 0.44–1.00)
GFR, Estimated: 41 mL/min — ABNORMAL LOW (ref >60)
Glucose, Bld: 98 mg/dL (ref 70–99)
Potassium: 4.7 mmol/L (ref 3.5–5.1)
Sodium: 133 mmol/L — ABNORMAL LOW (ref 135–145)

## 2021-03-15 LAB — CBC
HCT: 42.7 % (ref 36.0–46.0)
Hemoglobin: 13.5 g/dL (ref 12.0–15.0)
MCH: 28.7 pg (ref 26.0–34.0)
MCHC: 31.6 g/dL (ref 30.0–36.0)
MCV: 90.9 fL (ref 80.0–100.0)
Platelets: 524 10*3/uL — ABNORMAL HIGH (ref 150–400)
RBC: 4.7 MIL/uL (ref 3.87–5.11)
RDW: 17.6 % — ABNORMAL HIGH (ref 11.5–15.5)
WBC: 12.5 10*3/uL — ABNORMAL HIGH (ref 4.0–10.5)
nRBC: 0 % (ref 0.0–0.2)

## 2021-03-15 LAB — TSH: TSH: 16.457 u[IU]/mL — ABNORMAL HIGH (ref 0.350–4.500)

## 2021-03-15 LAB — MAGNESIUM: Magnesium: 2.4 mg/dL (ref 1.7–2.4)

## 2021-03-15 MED ORDER — LEVOTHYROXINE SODIUM 100 MCG PO TABS
100.0000 ug | ORAL_TABLET | Freq: Every day | ORAL | Status: DC
  Administered 2021-03-16 – 2021-03-18 (×3): 100 ug via ORAL
  Filled 2021-03-15 (×2): qty 1

## 2021-03-15 MED ORDER — APIXABAN 5 MG PO TABS
10.0000 mg | ORAL_TABLET | Freq: Two times a day (BID) | ORAL | Status: DC
  Administered 2021-03-15 – 2021-03-18 (×7): 10 mg via ORAL
  Filled 2021-03-15 (×7): qty 2

## 2021-03-15 MED ORDER — VITAMIN B-12 1000 MCG PO TABS
1000.0000 ug | ORAL_TABLET | Freq: Every day | ORAL | Status: DC
  Administered 2021-03-15 – 2021-03-18 (×4): 1000 ug via ORAL
  Filled 2021-03-15 (×4): qty 1

## 2021-03-15 MED ORDER — APIXABAN 5 MG PO TABS: 5.0000 mg | ORAL_TABLET | Freq: Two times a day (BID) | ORAL | Status: DC

## 2021-03-15 NOTE — Progress Notes (Signed)
M PROGRESS NOTE   [name] [name]  WGY:[id number]    DOB: 1934/12/11    DOA: 03/12/2021  PCP: Kelsey Bowen, MD   I have briefly reviewed patients previous medical records in [facility].  Chief Complaint  Patient presents with   Fall    Brief Narrative:  87 year old female, son lives with her, ambulates with the help of a walker, medical history significant for breast cancer, chronic diastolic CHF, COPD on home oxygen 4 L/min, chronic respiratory failure with hypoxia, hypothyroidism, DM, fibromyalgia, DVT/PE, HTN and HLD presented to ED following a mechanical fall 3 days prior to admission and left-sided chest pain.  She slid and fell.  No LOC.  In ED, CTA chest showed left upper lobe PE and left-sided 3 rib fractures.  Admitted for acute on chronic respiratory failure with hypoxia, acute pulmonary embolism, fall with left-sided rib fractures.   Assessment & Plan:  Principal Problem:   Acute on chronic respiratory failure with hypoxia (HCC) Active Problems:   Dyslipidemia   Vertebral compression fracture (HCC)   Anxiety and depression   Hypothyroidism   Rib fractures   COPD, severe (HCC)   Diabetes mellitus type 2 in nonobese ([place])   Pulmonary embolism (HCC)   Essential hypertension   Acute on chronic respiratory failure with hypoxia: At baseline, on home oxygen 4 L/min.  Presented with oxygen saturation of 75% despite home oxygen.  Acute respiratory failure likely related to newly diagnosed PE and rib fracture related chest pain and poor inspiratory effort.  Treat as below.  Incentive spirometry and mobilize.  Has been weaned down to her home level of oxygen.  Pulling 750 mL on IS.  Acute pulmonary embolism: Patient denies prior history of VTE but these were listed on her chart.  Now with new PE.  Likely related to decreased mobility in the setting of fall and rib fractures.  No right heart strain on CT.  Initially initiated on IV heparin.  As per TOC inquiry, Eliquis with  very high co-pay and hence initially ordered Xarelto.  However as per pharmacy, due to poor renal function recommends transitioning to apixaban instead and since patient going to SNF, co-pay should not matter.  Thereby switched over to apixaban.  Left fifth sixth and seventh rib fractures: S/p fall.  Multimodality pain control with scheduled Tylenol for few days, Lidoderm patch and as needed opioids.  Incentive spirometry.  Trauma team on board.  Yesterday had excessive sedation with [place] IR 5 Mg, reduced to 2.5 Mg every 8 hours as needed.  Monitor.  Acute kidney injury: Creatinine up from 1.81-1.25 >1.3.  Likely related to contrast.  HCTZ discontinued.  Stable.  Follow BMP in a.m.  NSSVT: noted on tele 1/13. Check Echo-yet to be done, TSH markedly elevated.  Replaced low K and magnesium.  B12 deficiency: B12: 173.  Hemoglobin normal.  Initiated oral B12 supplements.  Severe COPD with chronic respiratory failure with hypoxia on 4 L/min home oxygen: No clinical exacerbation.  Continue Dulera, as needed albuterol, Flonase and Claritin.  Stable.  T3 superior endplate compression fracture: New since 2017.  Per trauma team, neurosurgery consulted and recommended no bracing.  HTN: Discontinued HCTZ due to hyponatremia, hypokalemia, AKI.  As needed hydralazine.  Type II DM in nonobese: Recent A1c 5.3.  Hypothyroidism: TSH 16.457 suggests suboptimal supplementation.  Increased levothyroxine from 88 to 100 mcg daily.  Recommend repeating TSH in 4 to 6 weeks.  Anxiety and depression: Continue Ativan.  No longer on  Cymbalta.  Dyslipidemia: Continue statins.  S/p mechanical fall: PT and OT evaluation, recommend SNF.  TOC on board.  Body mass index is 23.2 kg/m.  Nutritional Status Nutrition Problem: Severe Malnutrition Etiology: chronic illness (CHF, COPD, IBS) Signs/Symptoms: severe fat depletion, severe muscle depletion, energy intake < or equal to 75% for > or equal to 1 month Interventions:  MVI, Prostat, Education, [place]ze, [name], Refer to RD note for recommendations   DVT prophylaxis:   Eliquis anticoagulation.   Code Status: DNR Family Communication: Discussed with daughter 1/14.  None at bedside today. Disposition:  Status is: Inpatient  Remains inpatient appropriate because: Severity of illness.        Consultants:   General surgery/trauma  Procedures:   None  Antimicrobials:   None   Subjective:  Reports generalized pain.  Has not gotten Oxy IR since yesterday morning.  Only received a Tylenol dose since then up to this morning.  No other complaints reported.  Objective:   Vitals:   03/14/21 2024 03/14/21 2220 03/15/21 0757 03/15/21 0850  BP: 98/62  (!) 97/53   Pulse: 80  78   Resp: 18 14 16    Temp: 97.9 F (36.6 C)  98 F (36.7 C)   TempSrc: Oral  Oral   SpO2: 95% 95% 95% 100%  Weight:      Height:        General exam: Elderly female, moderately built and frail, chronically ill looking, sitting up uncomfortably in bed due to pain. Respiratory system: Distant breath sounds but clear to auscultation without wheezing, rhonchi or crackles.  No increased work of breathing.  Lidocaine patch on left anterior chest Cardiovascular system: S1 and S2 heard, RRR.  No JVD, murmurs or pedal edema.  Telemetry personally reviewed: 26 beat NSVT at 7:19 AM on 1/15. Gastrointestinal system: Abdomen is nondistended, soft and nontender. No organomegaly or masses felt. Normal bowel sounds heard. Central nervous system: Alert and oriented. No focal neurological deficits. Extremities: Symmetric 5 x 5 power. Skin: Bilateral mid anterior shin dressings, clean and dry. Psychiatry: Judgement and insight appear normal. Mood & affect appropriate.     Data Reviewed:   I have personally reviewed following labs and imaging studies   CBC: Recent Labs  Lab 03/12/21 2047 03/12/21 2252 03/13/21 0618 03/14/21 0219 03/15/21 0218  WBC 15.7*   --  14.5* 12.3* 12.5*  NEUTROABS 13.5*  --   --   --   --   HGB 14.3   < > 14.5 14.2 13.5  HCT 45.5   < > 44.8 43.9 42.7  MCV 88.0  --  88.0 87.3 90.9  PLT 441*  --  392 453* 524*   < > = values in this interval not displayed.    Basic Metabolic Panel: Recent Labs  Lab 03/12/21 2047 03/12/21 2252 03/14/21 0219 03/14/21 1212 03/15/21 0218  NA 135 135 134*  --  133*  K 3.9 3.7 3.2*  --  4.7  CL 96*  --  93*  --  96*  CO2 26  --  27  --  28  GLUCOSE 105*  --  111*  --  98  BUN 25*  --  36*  --  44*  CREATININE 0.81  --  1.25*  --  1.30*  CALCIUM 8.7*  --  8.8*  --  8.5*  MG  --   --   --  1.7 2.4    Liver Function Tests: Recent Labs  Lab  03/12/21 2047  AST 14*  ALT 5  ALKPHOS 68  BILITOT 2.0*  PROT 7.1  ALBUMIN 3.1*    CBG: No results for input(s): GLUCAP in the last 168 hours.  Microbiology Studies:   Recent Results (from the past 240 hour(s))  Resp Panel by RT-PCR (Flu A&B, Covid) Nasopharyngeal Swab     Status: None   Collection Time: 03/12/21  8:56 PM   Specimen: Nasopharyngeal Swab; Nasopharyngeal(NP) swabs in vial transport medium  Result Value Ref Range Status   SARS Coronavirus 2 by RT PCR NEGATIVE NEGATIVE Final    Comment: (NOTE) SARS-CoV-2 target nucleic acids are NOT DETECTED.  The SARS-CoV-2 RNA is generally detectable in upper respiratory specimens during the acute phase of infection. The lowest concentration of SARS-CoV-2 viral copies this assay can detect is 138 copies/mL. A negative result does not preclude SARS-Cov-2 infection and should not be used as the sole basis for treatment or other patient management decisions. A negative result may occur with  improper specimen collection/handling, submission of specimen other than nasopharyngeal swab, presence of viral mutation(s) within the areas targeted by this assay, and inadequate number of viral copies(<138 copies/mL). A negative result must be combined with clinical observations, patient  history, and epidemiological information. The expected result is Negative.  Fact Sheet for Patients:  EntrepreneurPulse.com.au  Fact Sheet for Healthcare Providers:  IncredibleEmployment.be  This test is no t yet approved or cleared by the [place] FDA and  has been authorized for detection and/or diagnosis of SARS-CoV-2 by FDA under an Emergency Use Authorization (EUA). This EUA will remain  in effect (meaning this test can be used) for the duration of the COVID-19 declaration under Section 564(b)(1) of the Act, 21 U.S.C.section 360bbb-3(b)(1), unless the authorization is terminated  or revoked sooner.       Influenza A by PCR NEGATIVE NEGATIVE Final   Influenza B by PCR NEGATIVE NEGATIVE Final    Comment: (NOTE) The Xpert Xpress SARS-CoV-2/FLU/RSV plus assay is intended as an aid in the diagnosis of influenza from Nasopharyngeal swab specimens and should not be used as a sole basis for treatment. Nasal washings and aspirates are unacceptable for Xpert Xpress SARS-CoV-2/FLU/RSV testing.  Fact Sheet for Patients: EntrepreneurPulse.com.au  Fact Sheet for Healthcare Providers: IncredibleEmployment.be  This test is not yet approved or cleared by the [place] FDA and has been authorized for detection and/or diagnosis of SARS-CoV-2 by FDA under an Emergency Use Authorization (EUA). This EUA will remain in effect (meaning this test can be used) for the duration of the COVID-19 declaration under Section 564(b)(1) of the Act, 21 U.S.C. section 360bbb-3(b)(1), unless the authorization is terminated or revoked.  Performed at [facility], [address], [place], [place] [address]     Radiology Studies:  DG CHEST PORT 1 VIEW  Result Date: 03/14/2021 CLINICAL DATA:  Shortness of breath EXAM: PORTABLE CHEST 1 VIEW COMPARISON:  03/12/2021 FINDINGS: Cardiac size is within normal  limits. Diffuse increase in interstitial markings are seen in both lungs. There is possible interval worsening. There is no focal consolidation. There is blunting of both lateral CP angles. There is no pneumothorax. Surgical clips are seen in the left chest wall. There is deformity in the the lateral aspect of left ribs consistent with fractures. IMPRESSION: There is diffuse increase in interstitial markings in both lungs suggesting pulmonary fibrosis. Interstitial markings in both lungs appear more prominent which may be due to differences in the techniques or suggest superimposed pulmonary edema or  superimposed pneumonia. Small bilateral pleural effusions. Electronically Signed   By: Elmer Picker M.D.   On: 03/14/2021 10:22    Scheduled Meds:    (feeding supplement) PROSource Plus  30 mL Oral TID BM   acetaminophen  1,000 mg Oral TID   apixaban  10 mg Oral BID   Followed by   Derrill Memo ON 03/22/2021] apixaban  5 mg Oral BID   aspirin EC  81 mg Oral Daily   atorvastatin  40 mg Oral Daily   docusate sodium  100 mg Oral BID   feeding supplement  1 Container Oral TID BM   fluticasone  2 spray Each Nare Daily   gabapentin  100 mg Oral TID   levothyroxine  88 mcg Oral QAC breakfast   lidocaine  1 patch Transdermal Q24H   loratadine  10 mg Oral Daily   mometasone-formoterol  2 puff Inhalation BID   multivitamin with minerals  1 tablet Oral Daily   pantoprazole  40 mg Oral Daily   sodium chloride flush  3 mL Intravenous Q12H   vitamin B-12  1,000 mcg Oral Daily    Continuous Infusions:     LOS: 2 days     Vernell Leep, MD,  FACP, [facility], [facility], [facility] (Care Management Physician Certified) [place]  To contact the attending provider between 7A-7P or the covering provider during after hours 7P-7A, please log into the web site [email or web address] and access using universal [place] password for that web site. If you do not have the password, please  call the hospital operator.  03/15/2021, 2:08 PM

## 2021-03-15 NOTE — TOC Initial Note (Signed)
Transition of Care (TOC) - Initial/Assessment Note  ° ° °Patient Details  °Name: [name] °MRN: [id number] °Date of Birth: 11/10/1934 ° °Transition of Care (TOC) CM/SW Contact:    °,  Jon, LCSW °Phone Number: °03/15/2021, 11:08 AM ° °Clinical Narrative:     CSW met with pt regarding recommendation for SNF. Pt agreeable, choice document given, first choice would be [facility], permission given to send out referral in hub.  Pt lives with son [name], permission given to speak with him.    Pt is on home O2 with  [facility], no other services.  Pt is vaccinated for covid x2.   ° °Referral sent out in hub for SNF.       ° ° °Expected Discharge Plan: Skilled Nursing Facility °Barriers to Discharge: Continued Medical Work up, SNF Pending bed offer ° ° °Patient Goals and CMS Choice °Patient states their goals for this hospitalization and ongoing recovery are:: "100%" °CMS Medicare.gov Compare Post Acute Care list provided to:: Patient °Choice offered to / list presented to : Patient ° °Expected Discharge Plan and Services °Expected Discharge Plan: Skilled Nursing Facility °In-house Referral: Clinical Social Work °  °Post Acute Care Choice: Skilled Nursing Facility °Living arrangements for the past 2 months: Single Family Home °                °  °  °  °  °  °  °  °  °  °  ° °Prior Living Arrangements/Services °Living arrangements for the past 2 months: Single Family Home °Lives with:: Adult Children (lives with son [name]) °Patient language and need for interpreter reviewed:: Yes °Do you feel safe going back to the place where you live?: Yes      °Need for Family Participation in Patient Care: Yes (Comment) °Care giver support system in place?: Yes (comment) °Current home services: DME (home O2 through [facility]) °Criminal Activity/Legal Involvement Pertinent to Current Situation/Hospitalization: No - Comment as needed ° °Activities of Daily Living °Home Assistive Devices/Equipment: None °ADL Screening (condition  at time of admission) °Patient's cognitive ability adequate to safely complete daily activities?: Yes °Is the patient deaf or have difficulty hearing?: No °Does the patient have difficulty seeing, even when wearing glasses/contacts?: No °Does the patient have difficulty concentrating, remembering, or making decisions?: No °Patient able to express need for assistance with ADLs?: No °Does the patient have difficulty dressing or bathing?: Yes °Independently performs ADLs?: No °Communication: Independent °Dressing (OT): Dependent °Is this a change from baseline?: Change from baseline, expected to last >3 days °Grooming: Dependent °Is this a change from baseline?: Change from baseline, expected to last >3 days °Feeding: Independent °Bathing: Independent °Toileting: Independent °In/Out Bed: Independent °Walks in Home: Needs assistance °Is this a change from baseline?: Pre-admission baseline °Does the patient have difficulty walking or climbing stairs?: Yes °Weakness of Legs: Both °Weakness of Arms/Hands: Both ° °Permission Sought/Granted °Permission sought to share information with : Family Supports °Permission granted to share information with : Yes, Verbal Permission Granted ° Share Information with NAME: son [name] ° Permission granted to share info w AGENCY: SNF °   °   ° °Emotional Assessment °Appearance:: Appears stated age °Attitude/Demeanor/Rapport: Engaged °Affect (typically observed): Appropriate, Pleasant, Other (comment) (tired) °Orientation: : Oriented to Self, Oriented to Place, Oriented to  Time, Oriented to Situation °Alcohol / Substance Use: Not Applicable °Psych Involvement: No (comment) ° °Admission diagnosis:  Hypoxia [R09.02] °NSTEMI (non-ST elevated myocardial infarction) (HCC) [I21.4] °Fall, initial encounter [W19.XXXA] °Acute on   chronic respiratory failure with hypoxia (HCC) [J96.21] °Closed fracture of multiple ribs of left side, initial encounter [S22.42XA] °Patient Active Problem List  ° Diagnosis  Date Noted  ° Acute on chronic respiratory failure with hypoxia (HCC) 03/13/2021  ° Rib fractures 03/13/2021  ° COPD, severe (HCC) 03/13/2021  ° Diabetes mellitus type 2 in nonobese (HCC) 03/13/2021  ° Pulmonary embolism (HCC) 03/13/2021  ° Essential hypertension 03/13/2021  ° Sepsis (HCC) 04/10/2014  ° History of DVT (deep vein thrombosis)   ° Blood poisoning   ° Chronic venous insufficiency 02/15/2014  ° Paroxysmal SVT (supraventricular tachycardia) (HCC) 09/11/2013  ° Heart palpitations 08/22/2013  ° H. pylori infection 05/04/2013  ° DVT, LLE 04/18/2013  ° Anxiety and depression 04/18/2013  ° Hypothyroidism 04/18/2013  ° UTI (urinary tract infection) 04/10/2013  ° Hyponatremia 04/10/2013  ° Hypokalemia 04/10/2013  ° Leg edema, left 04/10/2013  ° Nausea with vomiting 04/10/2013  ° Adult failure to thrive 04/10/2013  ° Protein-calorie malnutrition, severe (HCC) 04/10/2013  ° Renal insufficiency 03/10/2013  ° GI bleed 03/10/2013  ° UGIB (upper gastrointestinal bleed) 03/10/2013  ° Breast cancer of upper-outer quadrant of right female breast (HCC) 01/04/2013  ° Neoplasm of left breast, primary tumor staging category Tis: ductal carcinoma in situ (DCIS) 01/04/2013  ° Vertebral compression fracture (HCC) 10/30/2012  ° Adrenal insufficiency (HCC) 10/30/2012  ° Osteoporosis 10/30/2012  ° Irritable bowel syndrome 10/30/2012  ° Peripheral neuropathy 10/30/2012  ° Unsteady gait 10/30/2012  ° Recurrent falls 10/30/2012  ° Chronic cough 07/18/2012  ° Snoring 06/14/2012  ° Thin skin   ° Ecchymosis   ° Seasonal allergies   ° Frequency of urination   ° Nocturia   ° Anemia   ° Seizures (HCC)   ° Unspecified hereditary and idiopathic peripheral neuropathy   ° Acute blood loss anemia 01/02/2011  ° Low serum cortisol level 01/02/2011  ° Dyslipidemia 01/23/2010  ° Obstructive sleep apnea 01/23/2010  ° MASS, LUNG 01/23/2010  ° °PCP:  South, Stephen, MD °Pharmacy:   °[id number] - [place], [place] - [address] AT  [address] °[address] °[place] [address] °Phone: [phone] Fax: [phone] ° ° ° ° °Social Determinants of Health (SDOH) Interventions °  ° °Readmission Risk Interventions °No flowsheet data found. ° ° °

## 2021-03-15 NOTE — Progress Notes (Addendum)
[place] for Heparin>lovenox > Apixaban Indication: pulmonary embolus  Allergies  Allergen Reactions   Penicillins Anaphylaxis   Adhesive [Tape] Other (See Comments)    Blisters    Morphine     Other reaction(s): Other (See Comments) HALLUCINATIONS   Doxycycline Nausea And Vomiting   Morphine And Related Other (See Comments)    HALLUCINATIONS    Patient Measurements: Height: 5\' 3"  (160 cm) Weight: 59.4 kg (130 lb 15.3 oz) IBW/kg (Calculated) : 52.4   Vital Signs: Temp: 98 F (36.7 C) (01/15 0757) Temp Source: Oral (01/15 0757) BP: 97/53 (01/15 0757) Pulse Rate: 78 (01/15 0757)  Labs: Recent Labs    03/12/21 2047 03/12/21 2236 03/12/21 2252 03/13/21 0618 03/13/21 0842 03/14/21 0219 03/15/21 0218  HGB 14.3  --    < > 14.5  --  14.2 13.5  HCT 45.5  --    < > 44.8  --  43.9 42.7  PLT 441*  --   --  392  --  453* 524*  HEPARINUNFRC  --   --   --   --  <0.10*  --   --   CREATININE 0.81  --   --   --   --  1.25* 1.30*  TROPONINIHS 30* 37*  --   --   --   --   --    < > = values in this interval not displayed.     Estimated Creatinine Clearance: 26.6 mL/min (A) (by C-G formula based on SCr of 1.3 mg/dL (H)).   Medical History: Past Medical History:  Diagnosis Date   Anemia    Asthma    CHF (congestive heart failure) (HCC)    COPD (chronic obstructive pulmonary disease) (HCC)    Diabetes mellitus without complication (HCC)    Fibromyalgia    History of breast cancer LEFT BREAST DCIS  1996   History of DVT (deep vein thrombosis)    History of pulmonary embolism    Hyperlipidemia    Hypertension    IBS (irritable bowel syndrome)    Lung mass PER DR CLANCE NOTE UNCLEAR IF LYMPHOMA FROM BX   FOLLOWED BY DR RUBIN   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   Osteoporosis    PONV (postoperative nausea and vomiting)    Vertebral compression fracture ([place]) 06/2012   L4      Assessment: 87 y/o F with subsegmental PE, pt has history of VTE no longer on anti-coagulants, CBC/renal function is good.  We will transition her to apixaban instead due to her poor renal function. Since she is going to SNF, copay should not matters as much at this point. Dr. Algis Liming is aware.   Goal of Therapy:  Monitor platelets by anticoagulation protocol: Yes   Plan:  Dc xarelto Apixaban 10mg  PO BID x7 days then 5mg  BID Rx will follow peripherally  Onnie Boer, PharmD, BCIDP, AAHIVP, CPP Infectious Disease Pharmacist 03/15/2021 1:43 PM

## 2021-03-15 NOTE — Progress Notes (Signed)
Patient ID: [name], female   DOB: 1934/10/03, 87 y.o.   MRN: [id number]      Subjective: Feels better. Getting 750+ on IS ROS negative except as listed above.  Objective: Vital signs in last 24 hours: Temp:  [97.9 F (36.6 C)-98 F (36.7 C)] 98 F (36.7 C) (01/15 0757) Pulse Rate:  [78-80] 78 (01/15 0757) Resp:  [13-18] 16 (01/15 0757) BP: (97-100)/(53-77) 97/53 (01/15 0757) SpO2:  [89 %-98 %] 95 % (01/15 0757) Last BM Date: 03/13/21  Intake/Output from previous day: 01/14 0701 - 01/15 0700 In: 240 [P.O.:240] Out: -  Intake/Output this shift: No intake/output data recorded.  General appearance: alert and cooperative Resp: few rales Chest wall: left sided chest wall tenderness Cardio: regular rate and rhythm GI: soft, NT, ND  Lab Results: CBC  Recent Labs    03/14/21 0219 03/15/21 0218  WBC 12.3* 12.5*  HGB 14.2 13.5  HCT 43.9 42.7  PLT 453* 524*   BMET Recent Labs    03/14/21 0219 03/15/21 0218  NA 134* 133*  K 3.2* 4.7  CL 93* 96*  CO2 27 28  GLUCOSE 111* 98  BUN 36* 44*  CREATININE 1.25* 1.30*  CALCIUM 8.8* 8.5*   PT/INR No results for input(s): LABPROT, INR in the last 72 hours. ABG Recent Labs    03/12/21 2252  HCO3 27.5    Studies/Results: DG CHEST PORT 1 VIEW  Result Date: 03/14/2021 CLINICAL DATA:  Shortness of breath EXAM: PORTABLE CHEST 1 VIEW COMPARISON:  03/12/2021 FINDINGS: Cardiac size is within normal limits. Diffuse increase in interstitial markings are seen in both lungs. There is possible interval worsening. There is no focal consolidation. There is blunting of both lateral CP angles. There is no pneumothorax. Surgical clips are seen in the left chest wall. There is deformity in the the lateral aspect of left ribs consistent with fractures. IMPRESSION: There is diffuse increase in interstitial markings in both lungs suggesting pulmonary fibrosis. Interstitial markings in both lungs appear more prominent which may be due to  differences in the techniques or suggest superimposed pulmonary edema or superimposed pneumonia. Small bilateral pleural effusions. Electronically Signed   By: Elmer Picker M.D.   On: 03/14/2021 10:22    Anti-infectives: Anti-infectives (From admission, onward)    None       Assessment/Plan: GLF Left 5-7 rib fractures with moderate left pleural effusion - multimodal pain control, pulm toilet, therapies - stable/improved. T3 superior endplate fracture - NSGY consulted, Dr. Kathyrn Sheriff. Recommends no bracing.    - Per TRH -  LUL PE - on heparin gtt - ok to transition to PO regimen as per TRH Elevated Tn  COPD on home o2  HTN HLD Hypothyroidism DM2 Fibromyalgia Remote hx of BC s/p lumpectomy/chemo/radiation   FEN - Reg, IVF per TRH VTE - SCDs, heparin gtt - ok to transition to PO regimen as per TRH ID - None Foley - None  On Home O2 reqs. No further needs anticipated from our perspective and we will remain available to assist in her care as needed moving forward   Moderate Medical Decision Making   LOS: 2 days   Nadeen Landau, MD [facility]  03/15/2021

## 2021-03-15 NOTE — Plan of Care (Signed)
  Problem: Clinical Measurements: Goal: Will remain free from infection Outcome: Progressing   Problem: Activity: Goal: Risk for activity intolerance will decrease Outcome: Progressing   Problem: Nutrition: Goal: Adequate nutrition will be maintained Outcome: Progressing   Problem: Coping: Goal: Level of anxiety will decrease Outcome: Progressing   

## 2021-03-15 NOTE — Evaluation (Signed)
Occupational Therapy Evaluation Patient Details Name: [name] MRN: [id number] DOB: 1934/04/23 Today's Date: 03/15/2021   History of Present Illness Pt. is 87 yr old F admitted on 03/12/21 s/p fall with c/o L sided chest pain.  Imaging (+) for PE, L 5-7 rib fx, T3 compression fx. PMH: anemia, asthma, CHF, COPD, DM, FM, breast CA, DVT, PE, HTN, neuropathy, osteoporosis   Clinical Impression   Pt reports independence with ADLs and uses rollator for mobility, wears O2 at home. Pt currently rating L rib pain 7/10, grimacing and guarding L side with movement. Currently, pt agreeable to sit EOB, min -max A for ADLs, min A for bed mobility, and min guard for lateral scoot transfer. Pt declining to attempt standing/OOB mobility at this time due to pain and fatigue. Pt presenting with impairments listed below, will continue to follow acutely. Recommend SNF vs. HHOT pending pt progress.     Recommendations for follow up therapy are one component of a multi-disciplinary discharge planning process, led by the attending physician.  Recommendations may be updated based on patient status, additional functional criteria and insurance authorization.   Follow Up Recommendations  Skilled nursing-short term rehab (<3 hours/day)    Assistance Recommended at Discharge Intermittent Supervision/Assistance  Patient can return home with the following A lot of help with walking and/or transfers;A lot of help with bathing/dressing/bathroom;Help with stairs or ramp for entrance;Assist for transportation;Assistance with cooking/housework    Functional Status Assessment  Patient has had a recent decline in their functional status and demonstrates the ability to make significant improvements in function in a reasonable and predictable amount of time.  Equipment Recommendations  Other (comment);Tub/shower seat (TBD next venue of care)    Recommendations for Other Services PT consult     Precautions /  Restrictions Precautions Precautions: Fall Restrictions Weight Bearing Restrictions: No Other Position/Activity Restrictions: Age indeterminate compression fx on imaging, per ortho no brace needed      Mobility Bed Mobility Overal bed mobility: Needs Assistance Bed Mobility: Supine to Sit     Supine to sit: Min assist     General bed mobility comments: assist for trunk elevation    Transfers Overall transfer level: Needs assistance   Transfers: Bed to chair/wheelchair/BSC            Lateral/Scoot Transfers: Min guard General transfer comment: able to bear weight through legs and bring hips off of bed to scoot laterally toward [facility]      Balance Overall balance assessment: Needs assistance;History of Falls Sitting-balance support: Feet supported;Bilateral upper extremity supported Sitting balance-Leahy Scale: Fair                                     ADL either performed or assessed with clinical judgement   ADL Overall ADL's : Needs assistance/impaired Eating/Feeding: Set up;Sitting   Grooming: Set up;Sitting;Oral care;Wash/dry face;Wash/dry hands Grooming Details (indicate cue type and reason): performs sitting EOB Upper Body Bathing: Minimal assistance;Sitting   Lower Body Bathing: Moderate assistance;Sitting/lateral leans   Upper Body Dressing : Sitting;Moderate assistance   Lower Body Dressing: Sit to/from stand;Maximal assistance   Toilet Transfer: Maximal assistance;BSC/3in1;Stand-pivot   Toileting- Clothing Manipulation and Hygiene: Maximal assistance;Sitting/lateral lean       Functional mobility during ADLs: Min guard General ADL Comments: min guard for seated ADLs, pt in too much pain to attempt standing     Vision   Vision Assessment?: No  apparent visual deficits     Perception     Praxis      Pertinent Vitals/Pain Pain Assessment: Faces Pain Score: 7  Faces Pain Scale: Hurts whole lot Pain Location: L side Pain  Descriptors / Indicators: Grimacing;Guarding;Tender Pain Intervention(s): Limited activity within patient's tolerance     Hand Dominance     Extremity/Trunk Assessment Upper Extremity Assessment Upper Extremity Assessment: Overall WFL for tasks assessed;Generalized weakness   Lower Extremity Assessment Lower Extremity Assessment: Defer to PT evaluation   Cervical / Trunk Assessment Cervical / Trunk Assessment: Kyphotic   Communication     Cognition Arousal/Alertness: Awake/alert Behavior During Therapy: Agitated;Flat affect;WFL for tasks assessed/performed                                   General Comments: a & o x4 this session     General Comments  Pt remained on O2 throughout session, pt purse lip breathing throughout due to L sided rib pain    Exercises     Shoulder Instructions      Home Living Family/patient expects to be discharged to:: Private residence Living Arrangements: Children (son lives with her) Available Help at Discharge: Family;Available PRN/intermittently Type of Home: House Home Access: Stairs to enter [employer] of Steps: 3 Entrance Stairs-Rails: None Home Layout: One level     Bathroom Shower/Tub: Teacher, early years/pre: Handicapped height Bathroom Accessibility: Yes How Accessible: Accessible via walker Home Equipment: Rollator (4 wheels)          Prior Functioning/Environment Prior Level of Function : Independent/Modified Independent             Mobility Comments: Rollator for mobility ADLs Comments: does not do IADLs , wears O2 at home        OT Problem List: Decreased strength;Decreased range of motion;Impaired balance (sitting and/or standing);Decreased activity tolerance;Pain;Decreased knowledge of use of DME or AE;Decreased safety awareness      OT Treatment/Interventions: Self-care/ADL training;Therapeutic exercise;DME and/or AE instruction;Therapeutic activities;Patient/family  education;Balance training    OT Goals(Current goals can be found in the care plan section) Acute Rehab OT Goals Patient Stated Goal: decrease pain OT Goal Formulation: With patient Time For Goal Achievement: 03/29/21 Potential to Achieve Goals: Good ADL Goals Pt Will Perform Upper Body Dressing: with min assist;sitting Pt Will Perform Lower Body Dressing: sit to/from stand;with mod assist Pt Will Transfer to Toilet: with mod assist;stand pivot transfer;bedside commode  OT Frequency: Min 2X/week    Co-evaluation              AM-PAC OT "6 Clicks" Daily Activity     Outcome Measure Help from another person eating meals?: None Help from another person taking care of personal grooming?: A Little Help from another person toileting, which includes using toliet, bedpan, or urinal?: A Lot Help from another person bathing (including washing, rinsing, drying)?: A Lot Help from another person to put on and taking off regular upper body clothing?: A Little Help from another person to put on and taking off regular lower body clothing?: A Lot 6 Click Score: 16   End of Session Equipment Utilized During Treatment: Oxygen Nurse Communication: Mobility status;Patient requests pain meds  Activity Tolerance: Patient limited by pain Patient left: in bed;with call bell/phone within reach;with bed alarm set;with nursing/sitter in room  OT Visit Diagnosis: Unsteadiness on feet (R26.81);Other abnormalities of gait and mobility (R26.89);Repeated falls (R29.6);Muscle weakness (generalized) (  M62.81);History of falling (Z91.81);Pain Pain - Right/Left: Left Pain - part of body:  (ribs)                Time: 1411-1440 OT Time Calculation (min): 29 min Charges:  OT General Charges $OT Visit: 1 Visit OT Evaluation $OT Eval Low Complexity: 1 Low OT Treatments $Self Care/Home Management : 8-22 mins  Lynnda Child, OTD, OTR/L Acute Rehab [phone]) [phone] - Avis 03/15/2021, 3:49 PM

## 2021-03-15 NOTE — NC FL2 (Signed)
[place] LEVEL OF CARE SCREENING TOOL     IDENTIFICATION  Patient Name: [name] Birthdate: [birth year] Sex: female Admission Date (Current Location): 03/12/2021  [facility] [place] Number:  Herbalist and Address:  [facility], [place] [address], [place], [place] [address]      Provider Number: [id number]  Attending Physician Name and Address:  Modena Jansky, MD  Relative Name and Phone Number:  Rouse,Chris Daughter [phone]    Current Level of Care: Hospital Recommended Level of Care: [place] Prior Approval Number:    Date Approved/Denied:   PASRR Number: [id number]  Discharge Plan: SNF    Current Diagnoses: Patient Active Problem List   Diagnosis Date Noted   Acute on chronic respiratory failure with hypoxia ([place]) 03/13/2021   Rib fractures 03/13/2021   COPD, severe ([place]) 03/13/2021   Diabetes mellitus type 2 in nonobese ([place]) 03/13/2021   Pulmonary embolism ([place]) 03/13/2021   Essential hypertension 03/13/2021   Sepsis ([place]) 04/10/2014   History of DVT (deep vein thrombosis)    Blood poisoning    Chronic venous insufficiency 02/15/2014   Paroxysmal SVT (supraventricular tachycardia) ([place]) 09/11/2013   Heart palpitations 08/22/2013   H. pylori infection 05/04/2013   DVT, LLE 04/18/2013   Anxiety and depression 04/18/2013   Hypothyroidism 04/18/2013   UTI (urinary tract infection) 04/10/2013   Hyponatremia 04/10/2013   Hypokalemia 04/10/2013   Leg edema, left 04/10/2013   Nausea with vomiting 04/10/2013   Adult failure to thrive 04/10/2013   Protein-calorie malnutrition, severe ([place]) 04/10/2013   Renal insufficiency 03/10/2013   GI bleed 03/10/2013   UGIB (upper gastrointestinal bleed) 03/10/2013   Breast cancer of upper-outer quadrant of right female breast ([place]) 01/04/2013   Neoplasm of left breast, primary tumor staging category Tis: ductal carcinoma in situ (DCIS)  01/04/2013   Vertebral compression fracture ([place]) 10/30/2012   Adrenal insufficiency (HCC) 10/30/2012   Osteoporosis 10/30/2012   Irritable bowel syndrome 10/30/2012   Peripheral neuropathy 10/30/2012   Unsteady gait 10/30/2012   Recurrent falls 10/30/2012   Chronic cough 07/18/2012   Snoring 06/14/2012   Thin skin    Ecchymosis    Seasonal allergies    Frequency of urination    Nocturia    Anemia    Seizures (HCC)    Unspecified hereditary and idiopathic peripheral neuropathy    Acute blood loss anemia 01/02/2011   Low serum cortisol level 01/02/2011   Dyslipidemia 01/23/2010   Obstructive sleep apnea 01/23/2010   MASS, LUNG 01/23/2010    Orientation RESPIRATION BLADDER Height & Weight     Self, Time, Situation, Place  O2 Incontinent Weight: 130 lb 15.3 oz (59.4 kg) Height:  5\' 3"  (160 cm)  BEHAVIORAL SYMPTOMS/MOOD NEUROLOGICAL BOWEL NUTRITION STATUS    Convulsions/Seizures Incontinent Diet (see discharge summary)  AMBULATORY STATUS COMMUNICATION OF NEEDS Skin   Total Care Verbally Skin abrasions                       Personal Care Assistance Level of Assistance  Bathing, Feeding, Dressing Bathing Assistance: Limited assistance Feeding assistance: Limited assistance Dressing Assistance: Maximum assistance     Functional Limitations Info  Sight, Hearing, Speech Sight Info: Adequate Hearing Info: Adequate Speech Info: Adequate    SPECIAL CARE FACTORS FREQUENCY  PT (By licensed PT), OT (By licensed OT)     PT Frequency: 5x week OT Frequency: 5x week  Contractures Contractures Info: Not present    Additional Factors Info  Code Status, Allergies Code Status Info: DNR Allergies Info: Penicillins, Adhesive (Tape), Morphine, Doxycycline, Morphine And Related           Current Medications (03/15/2021):  This is the current hospital active medication list Current Facility-Administered Medications  Medication Dose Route Frequency Provider  Last Rate Last Admin   (feeding supplement) PROSource Plus liquid 30 mL  30 mL Oral TID BM Karmen Bongo, MD   30 mL at 03/15/21 0948   acetaminophen (TYLENOL) tablet 1,000 mg  1,000 mg Oral TID Vernell Leep D, MD   1,000 mg at 03/15/21 0946   albuterol (PROVENTIL) (2.5 MG/3ML) 0.083% nebulizer solution 2.5 mg  2.5 mg Nebulization Q2H PRN Karmen Bongo, MD   2.5 mg at 03/15/21 0856   aspirin EC tablet 81 mg  81 mg Oral Daily Karmen Bongo, MD   81 mg at 03/15/21 0949   atorvastatin (LIPITOR) tablet 40 mg  40 mg Oral Daily Karmen Bongo, MD   40 mg at 03/15/21 1610   benzonatate (TESSALON) capsule 100 mg  100 mg Oral TID PRN Karmen Bongo, MD       bisacodyl (DULCOLAX) EC tablet 5 mg  5 mg Oral Daily PRN Karmen Bongo, MD       docusate sodium (COLACE) capsule 100 mg  100 mg Oral BID Karmen Bongo, MD   100 mg at 03/15/21 0951   feeding supplement (BOOST / RESOURCE BREEZE) liquid 1 Container  1 Container Oral TID BM Karmen Bongo, MD   1 Container at 03/14/21 1940   fentaNYL (SUBLIMAZE) injection 12.5-50 mcg  12.5-50 mcg Intravenous Q2H PRN Karmen Bongo, MD       fluticasone Asencion Islam) 50 MCG/ACT nasal spray 2 spray  2 spray Each Nare Daily Karmen Bongo, MD   2 spray at 03/15/21 0954   gabapentin (NEURONTIN) capsule 100 mg  100 mg Oral TID Karmen Bongo, MD   100 mg at 03/15/21 0949   hydrALAZINE (APRESOLINE) injection 5 mg  5 mg Intravenous Q4H PRN Karmen Bongo, MD       HYDROcodone bit-homatropine (HYCODAN) 5-1.5 MG/5ML syrup 5 mL  5 mL Oral Q6H PRN Karmen Bongo, MD       levothyroxine (SYNTHROID) tablet 88 mcg  88 mcg Oral QAC breakfast Karmen Bongo, MD   88 mcg at 03/15/21 0640   lidocaine (LIDODERM) 5 % 1 patch  1 patch Transdermal Q24H Karmen Bongo, MD   1 patch at 03/14/21 2117   loratadine (CLARITIN) tablet 10 mg  10 mg Oral Daily Karmen Bongo, MD   10 mg at 03/15/21 0950   LORazepam (ATIVAN) tablet 0.5 mg  0.5 mg Oral Q8H PRN Karmen Bongo, MD        mometasone-formoterol [facility]) 200-5 MCG/ACT inhaler 2 puff  2 puff Inhalation BID Karmen Bongo, MD   2 puff at 03/15/21 0849   multivitamin with minerals tablet 1 tablet  1 tablet Oral Daily Karmen Bongo, MD   1 tablet at 03/15/21 0947   ondansetron (ZOFRAN) tablet 4 mg  4 mg Oral Q6H PRN Karmen Bongo, MD       Or   ondansetron [facility]) injection 4 mg  4 mg Intravenous Q6H PRN Karmen Bongo, MD   4 mg at 03/14/21 1051   oxyCODONE (Oxy IR/ROXICODONE) immediate release tablet 2.5 mg  2.5 mg Oral Q8H PRN Hongalgi, Lenis Dickinson, MD       pantoprazole (PROTONIX) EC tablet 40 mg  40 mg Oral Daily Karmen Bongo, MD   40 mg at 03/15/21 0947   polyethylene glycol (MIRALAX / GLYCOLAX) packet 17 g  17 g Oral Daily PRN Karmen Bongo, MD       Rivaroxaban Alveda Reasons) tablet 15 mg  15 mg Oral BID WC Einar Grad, RPH       Followed by   Derrill Memo ON 04/05/2021] rivaroxaban (XARELTO) tablet 20 mg  20 mg Oral Q supper Einar Grad, [facility]       sodium chloride flush (NS) 0.9 % injection 3 mL  3 mL Intravenous Q12H Karmen Bongo, MD   3 mL at 03/15/21 6387   vitamin B-12 (CYANOCOBALAMIN) tablet 1,000 mcg  1,000 mcg Oral Daily Modena Jansky, MD   1,000 mcg at 03/15/21 5643   Facility-Administered Medications Ordered in Other Encounters  Medication Dose Route Frequency Provider Last Rate Last Admin   fentaNYL (SUBLIMAZE) injection 25-50 mcg  25-50 mcg Intravenous Q5 min PRN Rod Mae, MD         Discharge Medications: Please see discharge summary for a list of discharge medications.  Relevant Imaging Results:  Relevant Lab Results:   Additional Information SSN: [id number].  Pt is vaccinated for covid x2.  Joanne Chars, LCSW

## 2021-03-16 ENCOUNTER — Inpatient Hospital Stay (HOSPITAL_COMMUNITY): Payer: PPO

## 2021-03-16 DIAGNOSIS — I2699 Other pulmonary embolism without acute cor pulmonale: Secondary | ICD-10-CM

## 2021-03-16 DIAGNOSIS — I471 Supraventricular tachycardia: Secondary | ICD-10-CM | POA: Diagnosis not present

## 2021-03-16 LAB — CBC
HCT: 40.7 % (ref 36.0–46.0)
Hemoglobin: 12.9 g/dL (ref 12.0–15.0)
MCH: 28.7 pg (ref 26.0–34.0)
MCHC: 31.7 g/dL (ref 30.0–36.0)
MCV: 90.6 fL (ref 80.0–100.0)
Platelets: 512 10*3/uL — ABNORMAL HIGH (ref 150–400)
RBC: 4.49 MIL/uL (ref 3.87–5.11)
RDW: 17.7 % — ABNORMAL HIGH (ref 11.5–15.5)
WBC: 10.1 10*3/uL (ref 4.0–10.5)
nRBC: 0 % (ref 0.0–0.2)

## 2021-03-16 LAB — RESP PANEL BY RT-PCR (FLU A&B, COVID) ARPGX2
Influenza A by PCR: NEGATIVE
Influenza B by PCR: NEGATIVE
SARS Coronavirus 2 by RT PCR: NEGATIVE

## 2021-03-16 LAB — ECHOCARDIOGRAM COMPLETE
AR max vel: 1.8 cm2
AV Area VTI: 1.83 cm2
AV Area mean vel: 1.65 cm2
AV Mean grad: 4 mmHg
AV Peak grad: 6.7 mmHg
Ao pk vel: 1.29 m/s
Area-P 1/2: 4.06 cm2
Height: 63 in
S' Lateral: 2.3 cm
Weight: 2095.25 oz

## 2021-03-16 LAB — BASIC METABOLIC PANEL
Anion gap: 7 (ref 5–15)
BUN: 47 mg/dL — ABNORMAL HIGH (ref 8–23)
CO2: 28 mmol/L (ref 22–32)
Calcium: 8.3 mg/dL — ABNORMAL LOW (ref 8.9–10.3)
Chloride: 96 mmol/L — ABNORMAL LOW (ref 98–111)
Creatinine, Ser: 1.07 mg/dL — ABNORMAL HIGH (ref 0.44–1.00)
GFR, Estimated: 51 mL/min — ABNORMAL LOW (ref >60)
Glucose, Bld: 97 mg/dL (ref 70–99)
Potassium: 5.1 mmol/L (ref 3.5–5.1)
Sodium: 131 mmol/L — ABNORMAL LOW (ref 135–145)

## 2021-03-16 LAB — MISC LABCORP TEST (SEND OUT): Labcorp test code: 716910

## 2021-03-16 MED ORDER — METHOCARBAMOL 500 MG PO TABS
500.0000 mg | ORAL_TABLET | Freq: Three times a day (TID) | ORAL | Status: DC
  Administered 2021-03-16 – 2021-03-18 (×6): 500 mg via ORAL
  Filled 2021-03-16 (×6): qty 1

## 2021-03-16 NOTE — Progress Notes (Addendum)
M PROGRESS NOTE   [name] [name]  YNW:[id number]    DOB: [birth year]    DOA: 03/12/2021  PCP: Kelsey Bowen, MD   I have briefly reviewed patients previous medical records in [name].  Chief Complaint  Patient presents with   Fall    Brief Narrative:  87 year old female, son lives with her, ambulates with the help of a walker, medical history significant for breast cancer, chronic diastolic CHF, COPD on home oxygen 4 L/min, chronic respiratory failure with hypoxia, hypothyroidism, DM, fibromyalgia, DVT/PE, HTN and HLD presented to ED following a mechanical fall 3 days prior to admission and left-sided chest pain.  She slid and fell.  No LOC.  In ED, CTA chest showed left upper lobe PE and left-sided 3 rib fractures.  Admitted for acute on chronic respiratory failure with hypoxia, acute pulmonary embolism, fall with left-sided rib fractures.   Assessment & Plan:  Principal Problem:   Acute on chronic respiratory failure with hypoxia (HCC) Active Problems:   Dyslipidemia   Vertebral compression fracture (HCC)   Anxiety and depression   Hypothyroidism   Rib fractures   COPD, severe (HCC)   Diabetes mellitus type 2 in nonobese ([name])   Pulmonary embolism (HCC)   Essential hypertension   Acute on chronic respiratory failure with hypoxia: At baseline, on home oxygen 4 L/min.  Presented with oxygen saturation of 75% despite home oxygen.  Acute respiratory failure likely related to newly diagnosed PE and rib fracture related chest pain and poor inspiratory effort.  Treat as below.  Incentive spirometry and mobilize.  Has been weaned down to her home level of oxygen.  Pulling 750 mL on IS.  Acute pulmonary embolism: Patient denies prior history of VTE but these were listed on her chart.  Now with new PE.  Likely related to decreased mobility in the setting of fall and rib fractures.  No right heart strain on CT.  Initially initiated on IV heparin.  Now on apixaban.  Daughter  expressed that patient has had history of asymmetric significant left lower extremity and sedentary lifestyle.  She is interested in getting a LEV Doppler, ordered.  2D echo results as below.  Left fifth sixth and seventh rib fractures: S/p fall.  Multimodality pain control with scheduled Tylenol for few days, Lidoderm patch to left anterior chest and as needed opioids.  Incentive spirometry.  Oxy IR dose was reduced couple days ago due to oversedation.  Trauma team signed off 1/15.  Added Robaxin.  Acute kidney injury: Creatinine up from 1.81-1.25 >1.3.  Likely related to contrast.  HCTZ discontinued.  Resolved.  Creatinine down to 1.07.  NSSVT: noted on tele 1/13. TSH markedly elevated-see management below.  2D echo: LVEF 65-78%, grade 1 diastolic dysfunction, severe PAH.  Replaced low K and magnesium.  Infrequent episodes and asymptomatic.  Could consider cardioselective beta-blockers if becomes symptomatic or severe-trying to avoid due to severe COPD  Severe PAH: Noted on 2D echo.  Outpatient follow-up.  B12 deficiency: B12: 173.  Hemoglobin normal.  Initiated oral B12 supplements.  Severe COPD with chronic respiratory failure with hypoxia on 4 L/min home oxygen: No clinical exacerbation.  Continue Dulera, as needed albuterol, Flonase and Claritin.  Stable.  T3 superior endplate compression fracture: New since 2017.  Per trauma team, neurosurgery consulted and recommended no bracing.  HTN: Discontinued HCTZ due to hyponatremia, hypokalemia, AKI.  As needed hydralazine.  Type II DM in nonobese: Recent A1c 5.3.  Hypothyroidism: TSH 16.457 suggests suboptimal  supplementation.  Increased levothyroxine from 88 to 100 mcg daily.  Recommend repeating TSH in 4 to 6 weeks.  Anxiety and depression: Continue Ativan.  No longer on Cymbalta.  Dyslipidemia: Continue statins.  S/p mechanical fall: PT and OT evaluation, recommend SNF.  TOC on board.  Medically optimized for DC to SNF pending bed and  insurance authorization.  Body mass index is 23.2 kg/m.  Nutritional Status Nutrition Problem: Severe Malnutrition Etiology: chronic illness (CHF, COPD, IBS) Signs/Symptoms: severe fat depletion, severe muscle depletion, energy intake < or equal to 75% for > or equal to 1 month Interventions: MVI, Prostat, Education, [place], [name], Refer to RD note for recommendations   DVT prophylaxis:   Eliquis anticoagulation.   Code Status: DNR Family Communication: Discussed with daughter via phone. Disposition:  Status is: Inpatient  Remains inpatient appropriate because: Severity of illness.        Consultants:   General surgery/trauma  Procedures:   None  Antimicrobials:   None   Subjective:  Reports generalized pain and more so anterolateral chest wall.  Controlled on pain meds.  No dyspnea.  Objective:   Vitals:   03/16/21 0100 03/16/21 0425 03/16/21 0748 03/16/21 0755  BP:  (!) 102/52  (!) 99/53  Pulse:  82  74  Resp: 12 12  14   Temp:  97.9 F (36.6 C)  97.6 F (36.4 C)  TempSrc:  Oral  Oral  SpO2: 98% 98% 98% 96%  Weight:      Height:        General exam: Elderly female, moderately built and frail, chronically ill looking, sitting up comfortably in bed getting ready to eat breakfast this morning.  Looks much improved and not in pain this morning Respiratory system: Distant breath sounds but clear to auscultation without wheezing, rhonchi or crackles.  No increased work of breathing.  Lidocaine patch on left anterior chest.  Stable without change. Cardiovascular system: S1 and S2 heard, RRR.  No JVD, murmurs or pedal edema.  Telemetry personally reviewed: 7 bt NSSVT noted on 1/15 at 5:37 PM. Gastrointestinal system: Abdomen is nondistended, soft and nontender. No organomegaly or masses felt. Normal bowel sounds heard. Central nervous system: Alert and oriented. No focal neurological deficits. Extremities: Symmetric 5 x 5 power. Skin:  Bilateral mid anterior shin dressings, clean and dry. Psychiatry: Judgement and insight appear normal. Mood & affect appropriate.     Data Reviewed:   I have personally reviewed following labs and imaging studies   CBC: Recent Labs  Lab 03/12/21 2047 03/12/21 2252 03/14/21 0219 03/15/21 0218 03/16/21 0211  WBC 15.7*   < > 12.3* 12.5* 10.1  NEUTROABS 13.5*  --   --   --   --   HGB 14.3   < > 14.2 13.5 12.9  HCT 45.5   < > 43.9 42.7 40.7  MCV 88.0   < > 87.3 90.9 90.6  PLT 441*   < > 453* 524* 512*   < > = values in this interval not displayed.    Basic Metabolic Panel: Recent Labs  Lab 03/12/21 2047 03/12/21 2252 03/14/21 0219 03/14/21 1212 03/15/21 0218 03/16/21 0211  NA 135 135 134*  --  133* 131*  K 3.9 3.7 3.2*  --  4.7 5.1  CL 96*  --  93*  --  96* 96*  CO2 26  --  27  --  28 28  GLUCOSE 105*  --  111*  --  98 97  BUN 25*  --  36*  --  44* 47*  CREATININE 0.81  --  1.25*  --  1.30* 1.07*  CALCIUM 8.7*  --  8.8*  --  8.5* 8.3*  MG  --   --   --  1.7 2.4  --     Liver Function Tests: Recent Labs  Lab 03/12/21 2047  AST 14*  ALT 5  ALKPHOS 68  BILITOT 2.0*  PROT 7.1  ALBUMIN 3.1*    CBG: No results for input(s): GLUCAP in the last 168 hours.  Microbiology Studies:   Recent Results (from the past 240 hour(s))  Resp Panel by RT-PCR (Flu A&B, Covid) Nasopharyngeal Swab     Status: None   Collection Time: 03/12/21  8:56 PM   Specimen: Nasopharyngeal Swab; Nasopharyngeal(NP) swabs in vial transport medium  Result Value Ref Range Status   SARS Coronavirus 2 by RT PCR NEGATIVE NEGATIVE Final    Comment: (NOTE) SARS-CoV-2 target nucleic acids are NOT DETECTED.  The SARS-CoV-2 RNA is generally detectable in upper respiratory specimens during the acute phase of infection. The lowest concentration of SARS-CoV-2 viral copies this assay can detect is 138 copies/mL. A negative result does not preclude SARS-Cov-2 infection and should not be used as the  sole basis for treatment or other patient management decisions. A negative result may occur with  improper specimen collection/handling, submission of specimen other than nasopharyngeal swab, presence of viral mutation(s) within the areas targeted by this assay, and inadequate number of viral copies(<138 copies/mL). A negative result must be combined with clinical observations, patient history, and epidemiological information. The expected result is Negative.  Fact Sheet for Patients:  EntrepreneurPulse.com.au  Fact Sheet for Healthcare Providers:  IncredibleEmployment.be  This test is no t yet approved or cleared by the [place] FDA and  has been authorized for detection and/or diagnosis of SARS-CoV-2 by FDA under an Emergency Use Authorization (EUA). This EUA will remain  in effect (meaning this test can be used) for the duration of the COVID-19 declaration under Section 564(b)(1) of the Act, 21 U.S.C.section 360bbb-3(b)(1), unless the authorization is terminated  or revoked sooner.       Influenza A by PCR NEGATIVE NEGATIVE Final   Influenza B by PCR NEGATIVE NEGATIVE Final    Comment: (NOTE) The Xpert Xpress SARS-CoV-2/FLU/RSV plus assay is intended as an aid in the diagnosis of influenza from Nasopharyngeal swab specimens and should not be used as a sole basis for treatment. Nasal washings and aspirates are unacceptable for Xpert Xpress SARS-CoV-2/FLU/RSV testing.  Fact Sheet for Patients: EntrepreneurPulse.com.au  Fact Sheet for Healthcare Providers: IncredibleEmployment.be  This test is not yet approved or cleared by the [place] FDA and has been authorized for detection and/or diagnosis of SARS-CoV-2 by FDA under an Emergency Use Authorization (EUA). This EUA will remain in effect (meaning this test can be used) for the duration of the COVID-19 declaration under Section 564(b)(1) of the  Act, 21 U.S.C. section 360bbb-3(b)(1), unless the authorization is terminated or revoked.  Performed at [facility], [address] [place], [place] [address]   Resp Panel by RT-PCR (Flu A&B, Covid) Nasopharyngeal Swab     Status: None   Collection Time: 03/16/21 10:37 AM   Specimen: Nasopharyngeal Swab; Nasopharyngeal(NP) swabs in vial transport medium  Result Value Ref Range Status   SARS Coronavirus 2 by RT PCR NEGATIVE NEGATIVE Final    Comment: (NOTE) SARS-CoV-2 target nucleic acids are NOT DETECTED.  The SARS-CoV-2 RNA  is generally detectable in upper respiratory specimens during the acute phase of infection. The lowest concentration of SARS-CoV-2 viral copies this assay can detect is 138 copies/mL. A negative result does not preclude SARS-Cov-2 infection and should not be used as the sole basis for treatment or other patient management decisions. A negative result may occur with  improper specimen collection/handling, submission of specimen other than nasopharyngeal swab, presence of viral mutation(s) within the areas targeted by this assay, and inadequate number of viral copies(<138 copies/mL). A negative result must be combined with clinical observations, patient history, and epidemiological information. The expected result is Negative.  Fact Sheet for Patients:  EntrepreneurPulse.com.au  Fact Sheet for Healthcare Providers:  IncredibleEmployment.be  This test is no t yet approved or cleared by the [place] FDA and  has been authorized for detection and/or diagnosis of SARS-CoV-2 by FDA under an Emergency Use Authorization (EUA). This EUA will remain  in effect (meaning this test can be used) for the duration of the COVID-19 declaration under Section 564(b)(1) of the Act, 21 U.S.C.section 360bbb-3(b)(1), unless the authorization is terminated  or revoked sooner.       Influenza A by PCR NEGATIVE  NEGATIVE Final   Influenza B by PCR NEGATIVE NEGATIVE Final    Comment: (NOTE) The Xpert Xpress SARS-CoV-2/FLU/RSV plus assay is intended as an aid in the diagnosis of influenza from Nasopharyngeal swab specimens and should not be used as a sole basis for treatment. Nasal washings and aspirates are unacceptable for Xpert Xpress SARS-CoV-2/FLU/RSV testing.  Fact Sheet for Patients: EntrepreneurPulse.com.au  Fact Sheet for Healthcare Providers: IncredibleEmployment.be  This test is not yet approved or cleared by the [place] FDA and has been authorized for detection and/or diagnosis of SARS-CoV-2 by FDA under an Emergency Use Authorization (EUA). This EUA will remain in effect (meaning this test can be used) for the duration of the COVID-19 declaration under Section 564(b)(1) of the Act, 21 U.S.C. section 360bbb-3(b)(1), unless the authorization is terminated or revoked.  Performed at [facility], [place] [address] [place], [place] [address]     Radiology Studies:  ECHOCARDIOGRAM COMPLETE  Result Date: 03/16/2021    ECHOCARDIOGRAM REPORT   Patient Name:   [name] Date of Exam: 03/16/2021 Medical Rec #:  [id number]         Height:       63.0 in Accession #:    [id number]        Weight:       131.0 lb Date of Birth:  November 04, 1934         BSA:          1.615 m Patient Age:    20 years          BP:           102/52 mmHg Patient Gender: F                 HR:           74 bpm. Exam Location:  Inpatient Procedure: 2D Echo, Cardiac Doppler and Color Doppler Indications:    SVT  History:        Patient has prior history of Echocardiogram examinations, most                 recent 04/10/2013. COPD, Signs/Symptoms:Edema; Risk                 Factors:Diabetes, Hypertension and Dyslipidemia.  Sonographer:    Glo Herring  Referring Phys: [place]  1. Left ventricular ejection fraction, by estimation, is 60 to 65%. The left  ventricle has normal function. The left ventricle has no regional wall motion abnormalities. Left ventricular diastolic parameters are consistent with Grade I diastolic dysfunction (impaired relaxation). There is the interventricular septum is flattened in systole, consistent with right ventricular pressure overload.  2. Right ventricular systolic function is mildly reduced. The right ventricular size is moderately enlarged. There is severely elevated pulmonary artery systolic pressure. The estimated right ventricular systolic pressure is 20.2 mmHg.  3. Right atrial size was moderately dilated.  4. The mitral valve is grossly normal. Trivial mitral valve regurgitation.  5. The aortic valve was not well visualized. Aortic valve regurgitation is not visualized. Aortic valve sclerosis/calcification is present, without any evidence of aortic stenosis.  6. The inferior vena cava is dilated in size with <50% respiratory variability, suggesting right atrial pressure of 15 mmHg. Comparison(s): Prior images unable to be directly viewed, comparison made by report only. Changes from prior study are noted. 04/10/2013: LVEF 55-60%, RVSP 31 mmHg. FINDINGS  Left Ventricle: Left ventricular ejection fraction, by estimation, is 60 to 65%. The left ventricle has normal function. The left ventricle has no regional wall motion abnormalities. The left ventricular internal cavity size was normal in size. There is  no left ventricular hypertrophy. The interventricular septum is flattened in systole, consistent with right ventricular pressure overload. Left ventricular diastolic parameters are consistent with Grade I diastolic dysfunction (impaired relaxation). Indeterminate filling pressures. Right Ventricle: The right ventricular size is moderately enlarged. No increase in right ventricular wall thickness. Right ventricular systolic function is mildly reduced. There is severely elevated pulmonary artery systolic pressure. The tricuspid  regurgitant velocity is 4.04 m/s, and with an assumed right atrial pressure of 15 mmHg, the estimated right ventricular systolic pressure is 54.2 mmHg. Left Atrium: Left atrial size was normal in size. Right Atrium: Right atrial size was moderately dilated. Pericardium: There is no evidence of pericardial effusion. Mitral Valve: The mitral valve is grossly normal. Trivial mitral valve regurgitation. Tricuspid Valve: The tricuspid valve is grossly normal. Tricuspid valve regurgitation is mild. Aortic Valve: The aortic valve was not well visualized. Aortic valve regurgitation is not visualized. Aortic valve sclerosis/calcification is present, without any evidence of aortic stenosis. Aortic valve mean gradient measures 4.0 mmHg. Aortic valve peak gradient measures 6.7 mmHg. Aortic valve area, by VTI measures 1.83 cm. Pulmonic Valve: The pulmonic valve was not well visualized. Pulmonic valve regurgitation is not visualized. Aorta: The aortic root and ascending aorta are structurally normal, with no evidence of dilitation. Venous: The inferior vena cava is dilated in size with less than 50% respiratory variability, suggesting right atrial pressure of 15 mmHg. IAS/Shunts: No atrial level shunt detected by color flow Doppler.  LEFT VENTRICLE PLAX 2D LVIDd:         3.30 cm   Diastology LVIDs:         2.30 cm   LV e' medial:    4.79 cm/s LV PW:         0.90 cm   LV E/e' medial:  10.9 LV IVS:        0.90 cm   LV e' lateral:   4.35 cm/s LVOT diam:     1.90 cm   LV E/e' lateral: 12.0 LV SV:         48 LV SV Index:   30 LVOT Area:     2.84 cm  RIGHT VENTRICLE            IVC RV Basal diam:  5.40 cm    IVC diam: 2.40 cm RV S prime:     8.81 cm/s LEFT ATRIUM           Index        RIGHT ATRIUM           Index LA diam:      2.60 cm 1.61 cm/m   RA Area:     20.90 cm LA Vol (A4C): 49.4 ml 30.59 ml/m  RA Volume:   65.60 ml  40.62 ml/m  AORTIC VALVE AV Area (Vmax):    1.80 cm AV Area (Vmean):   1.65 cm AV Area (VTI):     1.83  cm AV Vmax:           129.00 cm/s AV Vmean:          93.100 cm/s AV VTI:            0.262 m AV Peak Grad:      6.7 mmHg AV Mean Grad:      4.0 mmHg LVOT Vmax:         81.80 cm/s LVOT Vmean:        54.100 cm/s LVOT VTI:          0.169 m LVOT/AV VTI ratio: 0.65  AORTA Ao Root diam: 3.00 cm Ao Asc diam:  2.90 cm MITRAL VALVE                TRICUSPID VALVE MV Area (PHT): 4.06 cm     TR Peak grad:   65.3 mmHg MV Decel Time: 187 msec     TR Vmax:        404.00 cm/s MV E velocity: 52.20 cm/s MV A velocity: 101.00 cm/s  SHUNTS MV E/A ratio:  0.52         Systemic VTI:  0.17 m                             Systemic Diam: 1.90 cm Lyman Bishop MD Electronically signed by Lyman Bishop MD Signature Date/Time: 03/16/2021/11:48:11 AM    Final     Scheduled Meds:    (feeding supplement) PROSource Plus  30 mL Oral TID BM   acetaminophen  1,000 mg Oral TID   apixaban  10 mg Oral BID   Followed by   Derrill Memo ON 03/22/2021] apixaban  5 mg Oral BID   aspirin EC  81 mg Oral Daily   atorvastatin  40 mg Oral Daily   docusate sodium  100 mg Oral BID   feeding supplement  1 Container Oral TID BM   fluticasone  2 spray Each Nare Daily   gabapentin  100 mg Oral TID   levothyroxine  100 mcg Oral QAC breakfast   lidocaine  1 patch Transdermal Q24H   loratadine  10 mg Oral Daily   mometasone-formoterol  2 puff Inhalation BID   multivitamin with minerals  1 tablet Oral Daily   pantoprazole  40 mg Oral Daily   sodium chloride flush  3 mL Intravenous Q12H   vitamin B-12  1,000 mcg Oral Daily    Continuous Infusions:     LOS: 3 days     Vernell Leep, MD,  FACP, [facility], [facility], [facility] (Care Management Physician Certified) [place]  To contact the attending provider between 7A-7P  or the covering provider during after hours 7P-7A, please log into the web site [email or web address] and access using universal [place] password for that web site. If you do not have the password, please call  the hospital operator.  03/16/2021, 2:45 PM

## 2021-03-16 NOTE — Plan of Care (Signed)

## 2021-03-16 NOTE — Progress Notes (Signed)
Occupational Therapy Treatment Patient Details Name: [name] MRN: [id number] DOB: 1934-09-12 Today's Date: 03/16/2021   History of present illness Pt. is 87 yr old F admitted on 03/12/21 s/p fall with c/o L sided chest pain.  Imaging (+) for PE, L 5-7 rib fx, T3 compression fx. PMH: anemia, asthma, CHF, COPD, DM, FM, breast CA, DVT, PE, HTN, neuropathy, osteoporosis   OT comments  Pt progressing towards goals this session, able to perform seated ADLs and bed mobility with min A. Min A +2 (mod A +1) for stand pivot transfer to recliner. Pt requires increased cuing and benefits from elevated surface when standing. Pt demonstrates fair sitting balance with occasional posterior lean, observed pt reaching for RN/bedrails while sitting EOB for balance. Pt presenting with impairments listed below, will continue to follow acutely. Recommend d/c to SNF.   Recommendations for follow up therapy are one component of a multi-disciplinary discharge planning process, led by the attending physician.  Recommendations may be updated based on patient status, additional functional criteria and insurance authorization.    Follow Up Recommendations  Skilled nursing-short term rehab (<3 hours/day)    Assistance Recommended at Discharge Intermittent Supervision/Assistance  Patient can return home with the following  A lot of help with walking and/or transfers;A lot of help with bathing/dressing/bathroom;Help with stairs or ramp for entrance;Assist for transportation;Assistance with cooking/housework   Equipment Recommendations  None recommended by OT;Other (comment) (defer to next venue of care)    Recommendations for Other Services PT consult    Precautions / Restrictions Precautions Precautions: Fall Restrictions Weight Bearing Restrictions: No Other Position/Activity Restrictions: Age indeterminate compression fx on imaging, per ortho no brace needed       Mobility Bed Mobility Overal bed  mobility: Needs Assistance Bed Mobility: Supine to Sit     Supine to sit: Min assist     General bed mobility comments: assist for trunk elevation, benefits from use of rails to remain sitting upright when unsupported    Transfers Overall transfer level: Needs assistance Equipment used: 2 person hand held assist Transfers: Bed to chair/wheelchair/BSC            Lateral/Scoot Transfers: Min assist       Balance Overall balance assessment: Needs assistance;History of Falls Sitting-balance support: Feet supported;Bilateral upper extremity supported Sitting balance-Leahy Scale: Fair     Standing balance support: During functional activity;Bilateral upper extremity supported Standing balance-Leahy Scale: Poor                             ADL either performed or assessed with clinical judgement   ADL Overall ADL's : Needs assistance/impaired                 Upper Body Dressing : Minimal assistance;Sitting Upper Body Dressing Details (indicate cue type and reason): donned clean gown sitting EOB     Toilet Transfer: Minimal assistance;Moderate assistance;+2 for physical assistance;Stand-pivot;BSC/3in1 Toilet Transfer Details (indicate cue type and reason): simulatd to recliner                Extremity/Trunk Assessment Upper Extremity Assessment Upper Extremity Assessment: Overall WFL for tasks assessed   Lower Extremity Assessment Lower Extremity Assessment: Defer to PT evaluation        Vision   Vision Assessment?: No apparent visual deficits   Perception Perception Perception: Not tested   Praxis Praxis Praxis: Not tested    Cognition Arousal/Alertness: Awake/alert Behavior During Therapy: [facility] for  tasks assessed/performed;Flat affect                                              Exercises     Shoulder Instructions       General Comments Pt on O2 throughout session, visitor present, VSS    Pertinent  Vitals/ Pain       Pain Assessment: Faces Pain Score: 5  Faces Pain Scale: Hurts even more Pain Location: L side Pain Descriptors / Indicators: Grimacing;Guarding;Tender Pain Intervention(s): Limited activity within patient's tolerance;Monitored during session;Premedicated before session  Home Living                                          Prior Functioning/Environment              Frequency  Min 2X/week        Progress Toward Goals  OT Goals(current goals can now be found in the care plan section)  Progress towards OT goals: Progressing toward goals  Acute Rehab OT Goals Patient Stated Goal: decrease pain OT Goal Formulation: With patient Time For Goal Achievement: 03/29/21 Potential to Achieve Goals: Good ADL Goals Pt Will Perform Upper Body Dressing: with min assist;sitting Pt Will Perform Lower Body Dressing: sit to/from stand;with mod assist Pt Will Transfer to Toilet: with mod assist;stand pivot transfer;bedside commode  Plan Discharge plan remains appropriate;Frequency remains appropriate    Co-evaluation                 AM-PAC OT "6 Clicks" Daily Activity     Outcome Measure   Help from another person eating meals?: None Help from another person taking care of personal grooming?: None Help from another person toileting, which includes using toliet, bedpan, or urinal?: A Lot Help from another person bathing (including washing, rinsing, drying)?: A Lot Help from another person to put on and taking off regular upper body clothing?: A Little Help from another person to put on and taking off regular lower body clothing?: A Lot 6 Click Score: 17    End of Session Equipment Utilized During Treatment: Oxygen  OT Visit Diagnosis: Unsteadiness on feet (R26.81);Other abnormalities of gait and mobility (R26.89);Repeated falls (R29.6);Muscle weakness (generalized) (M62.81);History of falling (Z91.81);Pain Pain - Right/Left: Left Pain -  part of body:  (ribs)   Activity Tolerance Patient limited by pain   Patient Left in chair;with call bell/phone within reach;with family/visitor present   Nurse Communication Mobility status        Time: 1534-1550 OT Time Calculation (min): 16 min  Charges: OT General Charges $OT Visit: 1 Visit OT Treatments $Self Care/Home Management : 8-22 mins  Lynnda Child, OTD, OTR/L Acute Rehab [phone] - [place] 03/16/2021, 5:11 PM

## 2021-03-16 NOTE — TOC Progression Note (Signed)
Transition of Care [facility]) - Progression Note    Patient Details  Name: [name] MRN: [id number] Date of Birth: 1934-03-16  [facility]) CM/SW Contact  Joanne Chars, LCSW Phone Number: 03/16/2021, 10:31 AM  Clinical Narrative:  CSW met with pt and provided bed offers.  Pt has spoken with her children and they do not want her to go to [facility], discussed other options, pt would like to go to [facility], who has not yet responded in hub.  CSW spoke with Star at [facility], she can make bed offer.  18: CSW again spoke with pt, her son [name] now in the room, they are in agreement with accepting bed offer from [facility].  CSW spoke with Jackelyn Poling at [facility] and requesting insurance auth and ambulance.       Expected Discharge Plan: [place] Barriers to Discharge: Continued Medical Work up, SNF Pending bed offer  Expected Discharge Plan and Services Expected Discharge Plan: [place] In-house Referral: Clinical Social Work   Post Acute Care Choice: [place] Living arrangements for the past 2 months: Single Family Home                                       Social Determinants of Health (SDOH) Interventions    Readmission Risk Interventions No flowsheet data found.

## 2021-03-16 NOTE — Progress Notes (Signed)
Lower extremity venous has been completed.   Preliminary results in CV Proc.   [name] 03/16/2021 3:49 PM

## 2021-03-17 DIAGNOSIS — I2699 Other pulmonary embolism without acute cor pulmonale: Principal | ICD-10-CM

## 2021-03-17 DIAGNOSIS — E039 Hypothyroidism, unspecified: Secondary | ICD-10-CM

## 2021-03-17 LAB — BASIC METABOLIC PANEL
Anion gap: 10 (ref 5–15)
BUN: 45 mg/dL — ABNORMAL HIGH (ref 8–23)
CO2: 22 mmol/L (ref 22–32)
Calcium: 8.1 mg/dL — ABNORMAL LOW (ref 8.9–10.3)
Chloride: 100 mmol/L (ref 98–111)
Creatinine, Ser: 0.98 mg/dL (ref 0.44–1.00)
GFR, Estimated: 57 mL/min — ABNORMAL LOW (ref >60)
Glucose, Bld: 119 mg/dL — ABNORMAL HIGH (ref 70–99)
Potassium: 4.5 mmol/L (ref 3.5–5.1)
Sodium: 132 mmol/L — ABNORMAL LOW (ref 135–145)

## 2021-03-17 LAB — VITAMIN B1: Vitamin B1 (Thiamine): 178.4 nmol/L (ref 66.5–200.0)

## 2021-03-17 LAB — COPPER, SERUM: Copper: 132 ug/dL (ref 80–158)

## 2021-03-17 LAB — ZINC: Zinc: 52 ug/dL (ref 44–115)

## 2021-03-17 MED ORDER — POLYETHYLENE GLYCOL 3350 17 G PO PACK: 17.0000 g | PACK | Freq: Every day | ORAL | Status: DC | PRN

## 2021-03-17 MED ORDER — APIXABAN 5 MG PO TABS: 10.0000 mg | ORAL_TABLET | Freq: Two times a day (BID) | ORAL | Status: DC

## 2021-03-17 MED ORDER — ONDANSETRON HCL 4 MG PO TABS: 4.0000 mg | ORAL_TABLET | Freq: Three times a day (TID) | ORAL | Status: AC | PRN

## 2021-03-17 MED ORDER — OXYCODONE HCL 5 MG PO TABS: 2.5000 mg | ORAL_TABLET | Freq: Three times a day (TID) | ORAL | 0 refills | Status: DC | PRN

## 2021-03-17 MED ORDER — LIDOCAINE 5 % EX PTCH: 1.0000 | MEDICATED_PATCH | CUTANEOUS | 0 refills | Status: DC

## 2021-03-17 MED ORDER — LEVOTHYROXINE SODIUM 100 MCG PO TABS: 100.0000 ug | ORAL_TABLET | Freq: Every day | ORAL | Status: AC

## 2021-03-17 MED ORDER — PROSOURCE PLUS PO LIQD: 30.0000 mL | Freq: Three times a day (TID) | ORAL | Status: AC

## 2021-03-17 MED ORDER — ACETAMINOPHEN 500 MG PO TABS: 1000.0000 mg | ORAL_TABLET | Freq: Four times a day (QID) | ORAL | Status: DC | PRN

## 2021-03-17 MED ORDER — CYANOCOBALAMIN 1000 MCG PO TABS: 1000.0000 ug | ORAL_TABLET | Freq: Every day | ORAL | Status: DC

## 2021-03-17 MED ORDER — HYDROCODONE BIT-HOMATROP MBR 5-1.5 MG/5ML PO SOLN: 5.0000 mL | Freq: Four times a day (QID) | ORAL | 0 refills | Status: DC | PRN

## 2021-03-17 MED ORDER — LORAZEPAM 0.5 MG PO TABS: 0.5000 mg | ORAL_TABLET | Freq: Three times a day (TID) | ORAL | 0 refills | Status: AC | PRN

## 2021-03-17 MED ORDER — APIXABAN 5 MG PO TABS: 5.0000 mg | ORAL_TABLET | Freq: Two times a day (BID) | ORAL | Status: AC

## 2021-03-17 MED ORDER — DOCUSATE SODIUM 100 MG PO CAPS: 100.0000 mg | ORAL_CAPSULE | Freq: Two times a day (BID) | ORAL | Status: DC

## 2021-03-17 MED ORDER — ADULT MULTIVITAMIN W/MINERALS CH: 1.0000 | ORAL_TABLET | Freq: Every day | ORAL | Status: DC

## 2021-03-17 MED ORDER — PANTOPRAZOLE SODIUM 40 MG PO TBEC: 40.0000 mg | DELAYED_RELEASE_TABLET | Freq: Every day | ORAL | Status: AC

## 2021-03-17 NOTE — Plan of Care (Signed)

## 2021-03-17 NOTE — Progress Notes (Signed)
Addendum  Patient was DC'ed earlier today. However RN reported an episode of non bloody vomiting and ongoing nausea.  Patient and family do not feel comfortable with patient discharging to SNF today.  That sounds reasonable.  Canceled discharge.  Monitor closely.  Continue PPI and as needed antiemetics.  Will reassess in a.m. regarding stability for DC to SNF on 1/18.  Vernell Leep, MD,  FACP, [facility], [facility], [facility] (Care Management Physician Certified) [facility] [place]  To contact the attending provider between 7A-7P or the covering provider during after hours 7P-7A, please log into the web site [email or web address] and access using universal [place] password for that web site. If you do not have the password, please call the hospital operator.

## 2021-03-17 NOTE — TOC Transition Note (Signed)
Transition of Care [facility]) - CM/SW Discharge Note   Patient Details  Name: FIDENCIA MCCLOUD MRN: [id number] Date of Birth: 06-09-1934  Transition of Care [facility]) CM/SW Contact:  Joanne Chars, LCSW Phone Number: 03/17/2021, 11:25 AM   Clinical Narrative:   Pt discharging to [facility] room 507P.  RN call [phone] for report.  Facility is asking for transportation after 130pm.     Final next level of care: [place] Barriers to Discharge: Barriers Resolved   Patient Goals and CMS Choice Patient states their goals for this hospitalization and ongoing recovery are:: "100%" CMS Medicare.gov Compare Post Acute Care list provided to:: Patient Choice offered to / list presented to : Patient  Discharge Placement              Patient chooses bed at:  [facility]) Patient to be transferred to facility by: [place] Name of family member notified: son Cherlynn Kaiser in room Patient and family notified of of transfer: 03/17/21  Discharge Plan and Services In-house Referral: Clinical Social Work   Post Acute Care Choice: [place]                               Social Determinants of Health ([place]) Interventions     Readmission Risk Interventions No flowsheet data found.

## 2021-03-17 NOTE — Progress Notes (Signed)
Patient vomitting on bedside commode. Given zofran IV PRN. Provider made aware.

## 2021-03-17 NOTE — TOC Progression Note (Signed)
Transition of Care [facility]) - Progression Note    Patient Details  Name: [name] MRN: [id number] Date of Birth: 24-Jun-1934  Transition of Care [facility]) CM/SW Contact  Joanne Chars, LCSW Phone Number: 03/17/2021, 9:37 AM  Clinical Narrative:    Pt HTA auth approved last night at 1816:  SNF 7 days, [phone].  Ambulance transport [phone].      Expected Discharge Plan: [place] Barriers to Discharge: Continued Medical Work up, SNF Pending bed offer  Expected Discharge Plan and Services Expected Discharge Plan: [place] In-house Referral: Clinical Social Work   Post Acute Care Choice: [place] Living arrangements for the past 2 months: Single Family Home                                       Social Determinants of Health (SDOH) Interventions    Readmission Risk Interventions No flowsheet data found.

## 2021-03-17 NOTE — Progress Notes (Signed)
Physical Therapy Treatment Patient Details Name: [name] MRN: [id number] DOB: 04-Oct-1934 Today's Date: 03/17/2021   History of Present Illness Pt. is 87 yr old F admitted on 03/12/21 s/p fall with c/o L sided chest pain.  Imaging (+) for PE, L 5-7 rib fx, T3 compression fx. PMH: anemia, asthma, CHF, COPD, DM, FM, breast CA, DVT, PE, HTN, neuropathy, osteoporosis    PT Comments    Pt demos significant improvement today from initial evaluation and is able to transfer from bed > chair with min A and use of RW.  Continues to demo balance deficits placing her at inc risk for falling during transfers.  AMPAC score is indicating home with HH, however, family support is only available intermittently and pt would be unsafe to attempt a transfer to a [facility] by herself at this point in time.  Due to those reasons, PT is continuing to recommend SNF.  However, is CM speaks with family and they are able to temporarily inc supervision/frequency of support, pt has potential to transfer home with [facility] and avoid SNF stay.   Recommendations for follow up therapy are one component of a multi-disciplinary discharge planning process, led by the attending physician.  Recommendations may be updated based on patient status, additional functional criteria and insurance authorization.  Follow Up Recommendations  Skilled nursing-short term rehab (<3 hours/day)     Assistance Recommended at Discharge Intermittent Supervision/Assistance  Patient can return home with the following A little help with walking and/or transfers;A little help with bathing/dressing/bathroom;Assistance with cooking/housework;Assist for transportation;Help with stairs or ramp for entrance   Equipment Recommendations       Recommendations for Other Services       Precautions / Restrictions Precautions Precautions: Fall Restrictions Weight Bearing Restrictions: No     Mobility  Bed Mobility Overal bed mobility: Needs Assistance Bed  Mobility: Supine to Sit     Supine to sit: Min assist     General bed mobility comments: Pt is able to transfer from sup > sitting with mod I, but req's min A with use of chuck pad to scoot foreward to EOB.  Fair sitting balance noted with inc posterior lean.  Able to correct with VCs. Patient Response: Cooperative  Transfers Overall transfer level: Needs assistance Equipment used: Rolling walker (2 wheels) Transfers: Sit to/from Stand, Bed to chair/wheelchair/BSC Sit to Stand: Min assist   Step pivot transfers: Min assist       General transfer comment: Pt completes sit > stand transfer and step pivot to chair with min A.  VCs for safety with hand placement.  Demos inc posterior lean in standing requiring TC to correct and min A to prevent LOB.    Ambulation/Gait                   Stairs             Wheelchair Mobility    Modified [place] (Stroke Patients Only)       Balance Overall balance assessment: Needs assistance Sitting-balance support: Bilateral upper extremity supported, Feet supported Sitting balance-Leahy Scale: Fair   Postural control: Posterior lean Standing balance support: Bilateral upper extremity supported, During functional activity, Reliant on assistive device for balance Standing balance-Leahy Scale: Poor                              Cognition Arousal/Alertness: Awake/alert Behavior During Therapy: WFL for tasks assessed/performed Overall Cognitive Status: Within  Functional Limits for tasks assessed                                 General Comments: Pt. is supine in bed when PT arrives.  Reluctant to work with PT and needs inc encouragement.  Takes several attempts but with education on the importance of OOB pt is agreeable to transfer to chair with assist so she can sit up and eat breakfast.        Exercises      General Comments        Pertinent Vitals/Pain Pain Assessment Pain Assessment:  0-10 Pain Score: 0-No pain    Home Living                          Prior Function            PT Goals (current goals can now be found in the care plan section) Progress towards PT goals: Progressing toward goals    Frequency    Min 2X/week      PT Plan Current plan remains appropriate    Co-evaluation              AM-PAC PT "6 Clicks" Mobility   Outcome Measure  Help needed turning from your back to your side while in a flat bed without using bedrails?: A Little Help needed moving from lying on your back to sitting on the side of a flat bed without using bedrails?: A Little Help needed moving to and from a bed to a chair (including a wheelchair)?: A Little Help needed standing up from a chair using your arms (e.g., wheelchair or bedside chair)?: A Little Help needed to walk in hospital room?: A Little Help needed climbing 3-5 steps with a railing? : A Lot 6 Click Score: 17    End of Session Equipment Utilized During Treatment: Gait belt Activity Tolerance: Patient tolerated treatment well Patient left: in chair;with call bell/phone within reach;with chair alarm set Nurse Communication: Mobility status       Time: 4196-2229 PT Time Calculation (min) (ACUTE ONLY): 16 min  Charges:  $Therapeutic Activity: 8-22 mins                    Bert Ptacek A. Xzayvion Vaeth, PT, DPT Acute Rehabilitation Services Office: [place] 03/17/2021, 10:05 AM

## 2021-03-17 NOTE — Discharge Summary (Addendum)
Physician Discharge Summary  [place] ZTI:[id number] DOB: [birth year]  PCP: Reynold Bowen, MD  Admitted from: Home Discharged to: SNF  Admit date: 03/12/2021 Discharge date: 03/18/21  Recommendations for Outpatient Follow-up:    Contact information for follow-up providers     MD at SNF. Schedule an appointment as soon as possible for a visit.   Why: To be seen in 2 to 3 days with repeat labs (CBC & BMP).        Reynold Bowen, MD. Schedule an appointment as soon as possible for a visit.   Specialty: Endocrinology Why: To be seen after discharge from SNF. Contact information: [address] [place] [address] [phone]              Contact information for after-discharge care     Destination     [facility] .   Service: Skilled Nursing Contact information: [place]: None    Equipment/Devices: TBD at SNF    Discharge Condition: Improved and stable   Code Status: DNR Diet recommendation:  Discharge Diet Orders (From admission, onward)     Start     Ordered   03/17/21 0000  Diet general        03/17/21 1038             Discharge Diagnoses:  Principal Problem:   Acute on chronic respiratory failure with hypoxia ([place]) Active Problems:   Dyslipidemia   Vertebral compression fracture ([facility])   Anxiety and depression   Hypothyroidism   Rib fractures   COPD, severe ([place])   Diabetes mellitus type 2 in nonobese ([place])   Pulmonary embolism ([place])   Essential hypertension   Brief Summary: 87 year old female, son lives with her, ambulates with the help of a walker, medical history significant for breast cancer, chronic diastolic CHF, COPD on home oxygen 4 L/min, chronic respiratory failure with hypoxia, hypothyroidism, DM, fibromyalgia, DVT/PE, HTN and HLD presented to ED following a mechanical fall 3 days prior to  admission and left-sided chest pain.  She slid and fell.  No LOC.  In ED, CTA chest showed left upper lobe PE and left-sided 3 rib fractures.  Admitted for acute on chronic respiratory failure with hypoxia, acute pulmonary embolism, fall with left-sided rib fractures.   1/18 addendum: Yesterday while patient had nausea vomiting and was not discharged.  Today she has had no GI symptoms.  Her blood pressure readings have been on the lower side by noticed cuff was too large for patient.  A small cuff of the reading was higher at 101/50.  She is asymptomatic.  She is stable for discharge.   Assessment & Plan:   Acute on chronic respiratory failure with hypoxia: At baseline, on home oxygen 4 L/min.  Presented with oxygen saturation of 75% despite home oxygen.  Acute respiratory failure likely related to newly diagnosed PE and rib fracture related chest pain and poor inspiratory effort.  Treat as below.  Incentive spirometry and mobilize.  Has been weaned down to her home level of oxygen.  Pulling 750 mL on IS.  Acute pulmonary embolism: Patient denies prior history of VTE but these were listed on her chart.  Now with new PE.  Likely related to decreased mobility in the setting of fall and rib fractures.  No right heart strain on CT.  Initially initiated on IV heparin.  Now on apixaban.  Daughter expressed that patient has had history of asymmetric significant left lower extremity and sedentary lifestyle. LEV Dopplers without DVT.  2D echo results as below.   Left fifth sixth and seventh rib fractures: S/p fall.  Multimodality pain control with scheduled Tylenol for few days, Lidoderm patch to left anterior chest and as needed opioids.  Incentive spirometry.  Oxy IR dose was reduced couple days ago due to oversedation.  Trauma team signed off 1/15.  Continues to report generalized pain, more so than left anterolateral chest wall but improved compared to prior.  Carefully adjust meds at SNF as deemed necessary  with close attention to sedation from opioids.   Acute kidney injury: Creatinine up from 1.81-1.25 >1.3.  Likely related to contrast.  HCTZ discontinued indefinitely.  Resolved.  Creatinine down to 0.98 on day of discharge.  SBP soft ranging in the 90s-100s.   Nonsustained SVT: Noted intermittently and infrequently on telemetry. TSH markedly elevated-see management below.  2D echo: LVEF 83-41%, grade 1 diastolic dysfunction, severe PAH.  Replaced low K and magnesium.  Infrequent episodes and asymptomatic.  Could consider cardioselective beta-blockers if becomes symptomatic or severe-trying to avoid due to severe COPD   Severe PAH: Noted on 2D echo.  Outpatient follow-up.   B12 deficiency: B12: 173.  Hemoglobin normal.  Initiated oral B12 supplements.  May repeat B12 levels in 4 to 6 weeks.  Severe COPD with chronic respiratory failure with hypoxia on 4 L/min home oxygen: No clinical exacerbation.  Continue Dulera, as needed albuterol, Flonase and Claritin.  Continue prior home meds.  Radiology suggested high-resolution CT for evaluation of progressive chronic lung disease, this can be pursued as outpatient  T3 superior endplate compression fracture: New since 2017.  Per trauma team, neurosurgery consulted and recommended no bracing.  HTN: Discontinued HCTZ due to hyponatremia, hypokalemia, AKI.  Blood pressure soft.  Type II DM in nonobese: Recent A1c 5.3.  Hypothyroidism: TSH 16.457 suggests suboptimal supplementation.  Increased levothyroxine from 88 to 100 mcg daily.  Recommend repeating TSH in 4 to 6 weeks.  Anxiety and depression: Continue Ativan as needed.  No longer on Cymbalta.  Dyslipidemia: Continue statins.   S/p mechanical fall: PT and OT evaluation, recommend SNF.  TOC on board.  Medically optimized for DC to SNF for short-term rehab   Body mass index is 23.2 kg/m.   Nutritional Status Nutrition Problem: Severe Malnutrition Etiology: chronic illness (CHF, COPD,  IBS) Signs/Symptoms: severe fat depletion, severe muscle depletion, energy intake < or equal to 75% for > or equal to 1 month Interventions: MVI, Prostat, Education, Boost Breeze, [name], Refer to RD note for recommendations    Consultants:   General surgery/trauma   Procedures:   None   Discharge Instructions  Discharge Instructions     Call MD for:  difficulty breathing, headache or visual disturbances   Complete by: As directed    Call MD for:  extreme fatigue   Complete by: As directed    Call MD for:  persistant dizziness or light-headedness   Complete by: As directed    Call MD for:  persistant nausea and vomiting   Complete by: As directed    Call MD for:  severe uncontrolled pain   Complete by: As directed    Call MD for:  temperature >100.4   Complete by: As directed    Diet general   Complete by: As directed    Discharge  instructions   Complete by: As directed    feeding supplement (BOOST / RESOURCE BREEZE) liquid 1 Container 1 Container, Oral, 3 times daily between meals   Increase activity slowly   Complete by: As directed         Medication List     STOP taking these medications    hydrochlorothiazide 25 MG tablet Commonly known as: HYDRODIURIL       TAKE these medications    (feeding supplement) PROSource Plus liquid Take 30 mLs by mouth 3 (three) times daily between meals.   acetaminophen 500 MG tablet Commonly known as: TYLENOL Take 2 tablets (1,000 mg total) by mouth every 6 (six) hours as needed for mild pain. What changed: reasons to take this   albuterol 1.25 MG/3ML nebulizer solution Commonly known as: ACCUNEB Take 1 ampule by nebulization every 6 (six) hours as needed for wheezing.   albuterol 108 (90 Base) MCG/ACT inhaler Commonly known as: VENTOLIN HFA Inhale 2 puffs into the lungs every 6 (six) hours as needed for wheezing or shortness of breath.   apixaban 5 MG Tabs tablet Commonly known as:  ELIQUIS Take 2 tablets (10 mg total) by mouth 2 (two) times daily for 4 days. Completes 03/21/2021 10 PM dose.  From 03/22/2021, start reduced dosing as per different prescription   apixaban 5 MG Tabs tablet Commonly known as: ELIQUIS Take 1 tablet (5 mg total) by mouth 2 (two) times daily. Start taking on: March 22, 2021   cyanocobalamin 1000 MCG tablet Take 1 tablet (1,000 mcg total) by mouth daily.   docusate sodium 100 MG capsule Commonly known as: COLACE Take 1 capsule (100 mg total) by mouth 2 (two) times daily.   fluticasone 50 MCG/ACT nasal spray Commonly known as: FLONASE Place 2 sprays into both nostrils daily.   Fluticasone-Salmeterol 250-50 MCG/DOSE Aepb Commonly known as: ADVAIR Inhale 1 puff into the lungs 2 (two) times daily as needed (shortness of breath).   gabapentin 100 MG capsule Commonly known as: NEURONTIN Take 100 mg by mouth 3 (three) times daily.   HYDROcodone bit-homatropine 5-1.5 MG/5ML syrup Commonly known as: HYCODAN Take 5 mLs by mouth every 6 (six) hours as needed for cough.   levothyroxine 100 MCG tablet Commonly known as: SYNTHROID Take 1 tablet (100 mcg total) by mouth daily before breakfast. For hypothyroid What changed:  medication strength how much to take   lidocaine 5 % Commonly known as: LIDODERM Place 1 patch onto the skin daily. Remove & Discard patch within 12 hours or as directed by MD. Apply to left upper lateral chest wall at site of maximum pain.   LORazepam 0.5 MG tablet Commonly known as: ATIVAN Take 1 tablet (0.5 mg total) by mouth every 8 (eight) hours as needed for anxiety. Anxiety   multivitamin with minerals Tabs tablet Take 1 tablet by mouth daily.   ondansetron 4 MG tablet Commonly known as: ZOFRAN Take 1 tablet (4 mg total) by mouth every 8 (eight) hours as needed for up to 2 days for nausea.   oxyCODONE 5 MG immediate release tablet Commonly known as: Oxy IR/ROXICODONE Take 0.5 tablets (2.5 mg total) by  mouth every 8 (eight) hours as needed for moderate pain or severe pain.   pantoprazole 40 MG tablet Commonly known as: Protonix Take 1 tablet (40 mg total) by mouth daily.   polyethylene glycol 17 g packet Commonly known as: MIRALAX / GLYCOLAX Take 17 g by mouth daily as needed for mild constipation.  simvastatin 80 MG tablet Commonly known as: ZOCOR Take 80 mg by mouth daily.       Allergies  Allergen Reactions   Penicillins Anaphylaxis   Adhesive [Tape] Other (See Comments)    Blisters    Morphine     Other reaction(s): Other (See Comments) HALLUCINATIONS   Doxycycline Nausea And Vomiting   Morphine And Related Other (See Comments)    HALLUCINATIONS      Procedures/Studies: DG Ribs Unilateral W/Chest Left  Result Date: 03/12/2021 CLINICAL DATA:  Fall with left tarsal pain EXAM: LEFT RIBS AND CHEST - 3+ VIEW COMPARISON:  Chest CT 04/01/2015, chest x-ray 04/17/2014 FINDINGS: Single view chest demonstrates diffuse coarse reticular opacity consistent with emphysema and chronic interstitial lung disease, findings appear worse as compared with prior radiograph. Possible small left-sided pleural effusion. Moderate hiatal hernia. Clips from in the left axilla and at the chest wall. Left rib series demonstrates acute left fourth fifth and sixth rib fractures. IMPRESSION: 1. Acute left fourth through sixth rib fractures. No visible pneumothorax. 2. Diffuse coarse interstitial reticular opacity consistent with emphysema and probable chronic lung disease though difficult to exclude acute superimposed interstitial process. Small left effusion. 3. Hiatal hernia Electronically Signed   By: Donavan Foil M.D.   On: 03/12/2021 20:18   CT Head Wo Contrast  Result Date: 03/12/2021 CLINICAL DATA:  Head trauma, minor (Age >= 65y).  Fall. EXAM: CT HEAD WITHOUT CONTRAST TECHNIQUE: Contiguous axial images were obtained from the base of the skull through the vertex without intravenous contrast.  RADIATION DOSE REDUCTION: This exam was performed according to the departmental dose-optimization program which includes automated exposure control, adjustment of the mA and/or kV according to patient size and/or use of iterative reconstruction technique. COMPARISON:  05/10/2016 FINDINGS: Brain: Mild age related volume loss. No acute intracranial abnormality. Specifically, no hemorrhage, hydrocephalus, mass lesion, acute infarction, or significant intracranial injury. Vascular: No hyperdense vessel or unexpected calcification. Skull: No acute calvarial abnormality. Sinuses/Orbits: No acute findings Other: None IMPRESSION: No acute intracranial abnormality. Electronically Signed   By: Rolm Baptise M.D.   On: 03/12/2021 21:27   CT Angio Chest PE W and/or Wo Contrast  Result Date: 03/12/2021 CLINICAL DATA:  Pulmonary embolism (PE) suspected, high prob Fall on left side after tripping over oxygen.  Left-sided pain. EXAM: CT ANGIOGRAPHY CHEST WITH CONTRAST TECHNIQUE: Multidetector CT imaging of the chest was performed using the standard protocol during bolus administration of intravenous contrast. Multiplanar CT image reconstructions and MIPs were obtained to evaluate the vascular anatomy. RADIATION DOSE REDUCTION: This exam was performed according to the departmental dose-optimization program which includes automated exposure control, adjustment of the mA and/or kV according to patient size and/or use of iterative reconstruction technique. CONTRAST:  73mL OMNIPAQUE IOHEXOL 350 MG/ML SOLN COMPARISON:  Rib radiographs earlier today. FINDINGS: Cardiovascular: Subsegmental embolus in the anterior left upper lobe, series 6, image 99. No additional pulmonary emboli. Dilated main pulmonary artery at 3.4 cm. Atherosclerotic tortuous thoracic aorta. There are coronary artery calcifications. Cardiomegaly with right heart dilatation. Contrast refluxes into the hepatic veins and IVC. No pericardial effusion. Mediastinum/Nodes:  No mediastinal hemorrhage or hematoma. Scattered mediastinal lymph nodes, likely reactive. Prominent right hilar node. Moderate-sized hiatal hernia. Lungs/Pleura: Advanced emphysema and cystic lung disease. Significant progression from 2017 exam. Moderate left pleural effusion with adjacent atelectasis. No pneumothorax. Upper Abdomen: No free fluid. Contrast refluxes into the hepatic veins and IVC. Musculoskeletal: Left lateral fifth, 6, and seventh rib fractures, mildly displaced. There are multiple  posterior rib fractures that have callus formation and are remote. Chronic and unchanged compression fractures of T7, T8, T10, and T12. T3 superior endplate compression fracture is new from 2017 exam, but age indeterminate. Surgical clips in both breasts. No confluent chest wall soft tissue hematoma. Review of the MIP images confirms the above findings. IMPRESSION: 1. Subsegmental embolus in the anterior left upper lobe. 2. Left lateral fifth, sixth, and seventh rib fractures, mildly displaced. 3. Moderate left pleural effusion with adjacent atelectasis. No pneumothorax. 4. Advanced chronic lung disease with emphysema and diffuse pulmonary cystic change. Definite progression from 2017 CT. As clinically indicated after resolution of acute event, consider high-resolution chest CT. 5. Dilated main pulmonary artery consistent with pulmonary arterial hypertension. 6. Contrast refluxes into the hepatic veins and IVC, consistent with elevated right heart pressures. 7. T3 superior endplate compression fracture is new from 2017 exam, but age indeterminate. Multiple additional chronic compression fractures throughout the thoracic spine are unchanged. Aortic Atherosclerosis (ICD10-I70.0) and Emphysema (ICD10-J43.9). These results were called by telephone at the time of interpretation on 03/12/2021 at 11:37 pm to provider Bero, who verbally acknowledged these results. Electronically Signed   By: Keith Rake M.D.   On:  03/12/2021 23:39   CT Cervical Spine Wo Contrast  Result Date: 03/12/2021 CLINICAL DATA:  fall w/ head injury EXAM: CT CERVICAL SPINE WITHOUT CONTRAST TECHNIQUE: Multidetector CT imaging of the cervical spine was performed without intravenous contrast. Multiplanar CT image reconstructions were also generated. RADIATION DOSE REDUCTION: This exam was performed according to the departmental dose-optimization program which includes automated exposure control, adjustment of the mA and/or kV according to patient size and/or use of iterative reconstruction technique. COMPARISON:  None. FINDINGS: Alignment: No subluxation. Skull base and vertebrae: No acute fracture. No primary bone lesion or focal pathologic process. Soft tissues and spinal canal: No prevertebral fluid or swelling. No visible canal hematoma. Disc levels:  Diffuse degenerative disc disease and facet disease. Upper chest: Cystic spaces within the upper lobes, progressing since prior study, presumably progressive bronchiectasis and emphysema. No acute findings. Other: None IMPRESSION: Diffuse degenerative disc and facet disease. No acute bony abnormality. Electronically Signed   By: Rolm Baptise M.D.   On: 03/12/2021 21:29   DG CHEST PORT 1 VIEW  Result Date: 03/14/2021 CLINICAL DATA:  Shortness of breath EXAM: PORTABLE CHEST 1 VIEW COMPARISON:  03/12/2021 FINDINGS: Cardiac size is within normal limits. Diffuse increase in interstitial markings are seen in both lungs. There is possible interval worsening. There is no focal consolidation. There is blunting of both lateral CP angles. There is no pneumothorax. Surgical clips are seen in the left chest wall. There is deformity in the the lateral aspect of left ribs consistent with fractures. IMPRESSION: There is diffuse increase in interstitial markings in both lungs suggesting pulmonary fibrosis. Interstitial markings in both lungs appear more prominent which may be due to differences in the techniques  or suggest superimposed pulmonary edema or superimposed pneumonia. Small bilateral pleural effusions. Electronically Signed   By: Elmer Picker M.D.   On: 03/14/2021 10:22   ECHOCARDIOGRAM COMPLETE  Result Date: 03/16/2021    ECHOCARDIOGRAM REPORT   Patient Name:   EVELYNNE SPIERS Date of Exam: 03/16/2021 Medical Rec #:  [id number]         Height:       63.0 in Accession #:    [id number]        Weight:       131.0 lb Date of Birth:  1934-06-21         BSA:          1.615 m Patient Age:    87 years          BP:           102/52 mmHg Patient Gender: F                 HR:           74 bpm. Exam Location:  Inpatient Procedure: 2D Echo, Cardiac Doppler and Color Doppler Indications:    SVT  History:        Patient has prior history of Echocardiogram examinations, most                 recent 04/10/2013. COPD, Signs/Symptoms:Edema; Risk                 Factors:Diabetes, Hypertension and Dyslipidemia.  Sonographer:    Glo Herring Referring Phys: [phone] ANAND D HONGALGI IMPRESSIONS  1. Left ventricular ejection fraction, by estimation, is 60 to 65%. The left ventricle has normal function. The left ventricle has no regional wall motion abnormalities. Left ventricular diastolic parameters are consistent with Grade I diastolic dysfunction (impaired relaxation). There is the interventricular septum is flattened in systole, consistent with right ventricular pressure overload.  2. Right ventricular systolic function is mildly reduced. The right ventricular size is moderately enlarged. There is severely elevated pulmonary artery systolic pressure. The estimated right ventricular systolic pressure is 38.2 mmHg.  3. Right atrial size was moderately dilated.  4. The mitral valve is grossly normal. Trivial mitral valve regurgitation.  5. The aortic valve was not well visualized. Aortic valve regurgitation is not visualized. Aortic valve sclerosis/calcification is present, without any evidence of aortic stenosis.  6. The  inferior vena cava is dilated in size with <50% respiratory variability, suggesting right atrial pressure of 15 mmHg. Comparison(s): Prior images unable to be directly viewed, comparison made by report only. Changes from prior study are noted. 04/10/2013: LVEF 55-60%, RVSP 31 mmHg. FINDINGS  Left Ventricle: Left ventricular ejection fraction, by estimation, is 60 to 65%. The left ventricle has normal function. The left ventricle has no regional wall motion abnormalities. The left ventricular internal cavity size was normal in size. There is  no left ventricular hypertrophy. The interventricular septum is flattened in systole, consistent with right ventricular pressure overload. Left ventricular diastolic parameters are consistent with Grade I diastolic dysfunction (impaired relaxation). Indeterminate filling pressures. Right Ventricle: The right ventricular size is moderately enlarged. No increase in right ventricular wall thickness. Right ventricular systolic function is mildly reduced. There is severely elevated pulmonary artery systolic pressure. The tricuspid regurgitant velocity is 4.04 m/s, and with an assumed right atrial pressure of 15 mmHg, the estimated right ventricular systolic pressure is 50.5 mmHg. Left Atrium: Left atrial size was normal in size. Right Atrium: Right atrial size was moderately dilated. Pericardium: There is no evidence of pericardial effusion. Mitral Valve: The mitral valve is grossly normal. Trivial mitral valve regurgitation. Tricuspid Valve: The tricuspid valve is grossly normal. Tricuspid valve regurgitation is mild. Aortic Valve: The aortic valve was not well visualized. Aortic valve regurgitation is not visualized. Aortic valve sclerosis/calcification is present, without any evidence of aortic stenosis. Aortic valve mean gradient measures 4.0 mmHg. Aortic valve peak gradient measures 6.7 mmHg. Aortic valve area, by VTI measures 1.83 cm. Pulmonic Valve: The pulmonic valve was not  well visualized. Pulmonic valve regurgitation is not visualized. Aorta: The  aortic root and ascending aorta are structurally normal, with no evidence of dilitation. Venous: The inferior vena cava is dilated in size with less than 50% respiratory variability, suggesting right atrial pressure of 15 mmHg. IAS/Shunts: No atrial level shunt detected by color flow Doppler.  LEFT VENTRICLE PLAX 2D LVIDd:         3.30 cm   Diastology LVIDs:         2.30 cm   LV e' medial:    4.79 cm/s LV PW:         0.90 cm   LV E/e' medial:  10.9 LV IVS:        0.90 cm   LV e' lateral:   4.35 cm/s LVOT diam:     1.90 cm   LV E/e' lateral: 12.0 LV SV:         48 LV SV Index:   30 LVOT Area:     2.84 cm  RIGHT VENTRICLE            IVC RV Basal diam:  5.40 cm    IVC diam: 2.40 cm RV S prime:     8.81 cm/s LEFT ATRIUM           Index        RIGHT ATRIUM           Index LA diam:      2.60 cm 1.61 cm/m   RA Area:     20.90 cm LA Vol (A4C): 49.4 ml 30.59 ml/m  RA Volume:   65.60 ml  40.62 ml/m  AORTIC VALVE AV Area (Vmax):    1.80 cm AV Area (Vmean):   1.65 cm AV Area (VTI):     1.83 cm AV Vmax:           129.00 cm/s AV Vmean:          93.100 cm/s AV VTI:            0.262 m AV Peak Grad:      6.7 mmHg AV Mean Grad:      4.0 mmHg LVOT Vmax:         81.80 cm/s LVOT Vmean:        54.100 cm/s LVOT VTI:          0.169 m LVOT/AV VTI ratio: 0.65  AORTA Ao Root diam: 3.00 cm Ao Asc diam:  2.90 cm MITRAL VALVE                TRICUSPID VALVE MV Area (PHT): 4.06 cm     TR Peak grad:   65.3 mmHg MV Decel Time: 187 msec     TR Vmax:        404.00 cm/s MV E velocity: 52.20 cm/s MV A velocity: 101.00 cm/s  SHUNTS MV E/A ratio:  0.52         Systemic VTI:  0.17 m                             Systemic Diam: 1.90 cm Lyman Bishop MD Electronically signed by Lyman Bishop MD Signature Date/Time: 03/16/2021/11:48:11 AM    Final    VAS [place] LOWER EXTREMITY VENOUS (DVT)  Result Date: 03/16/2021  Lower Venous DVT Study Patient Name:  MANHATTAN MCCUEN  Date of  Exam:   03/16/2021 Medical Rec #: [id number]          Accession #:    [id number] Date of  Birth: 1934/10/30          Patient Gender: F Patient Age:   89 years Exam Location[facility]  [facility] Procedure:      VAS [place] LOWER EXTREMITY VENOUS (DVT) Referring Phys: ANAND HONGALGI --------------------------------------------------------------------------------  Indications: Pulmonary embolism.  Comparison Study: 12/04/20 Performing Technologist: Archie Patten RVS  Examination Guidelines: A complete evaluation includes B-mode imaging, spectral Doppler, color Doppler, and power Doppler as needed of all accessible portions of each vessel. Bilateral testing is considered an integral part of a complete examination. Limited examinations for reoccurring indications may be performed as noted. The reflux portion of the exam is performed with the patient in reverse Trendelenburg.  +---------+---------------+---------+-----------+----------+--------------+  RIGHT     Compressibility Phasicity Spontaneity Properties Thrombus Aging  +---------+---------------+---------+-----------+----------+--------------+  CFV       Full            Yes       Yes                                    +---------+---------------+---------+-----------+----------+--------------+  SFJ       Full                                                             +---------+---------------+---------+-----------+----------+--------------+  FV Prox   Full                                                             +---------+---------------+---------+-----------+----------+--------------+  FV Mid    Full                                                             +---------+---------------+---------+-----------+----------+--------------+  FV Distal Full                                                             +---------+---------------+---------+-----------+----------+--------------+  PFV       Full                                                              +---------+---------------+---------+-----------+----------+--------------+  POP       Full            Yes       Yes                                    +---------+---------------+---------+-----------+----------+--------------+  PTV       Full                                                             +---------+---------------+---------+-----------+----------+--------------+  PERO      Full                                                             +---------+---------------+---------+-----------+----------+--------------+   +---------+---------------+---------+-----------+----------+--------------+  LEFT      Compressibility Phasicity Spontaneity Properties Thrombus Aging  +---------+---------------+---------+-----------+----------+--------------+  CFV       Full            Yes       Yes                                    +---------+---------------+---------+-----------+----------+--------------+  SFJ       Full                                                             +---------+---------------+---------+-----------+----------+--------------+  FV Prox   Full                                                             +---------+---------------+---------+-----------+----------+--------------+  FV Mid    Full                                                             +---------+---------------+---------+-----------+----------+--------------+  FV Distal Full                                                             +---------+---------------+---------+-----------+----------+--------------+  PFV       Full                                                             +---------+---------------+---------+-----------+----------+--------------+  POP       Full            Yes       Yes                                    +---------+---------------+---------+-----------+----------+--------------+  PTV       Full                                                              +---------+---------------+---------+-----------+----------+--------------+  PERO      Full                                                             +---------+---------------+---------+-----------+----------+--------------+     Summary: BILATERAL: - No evidence of deep vein thrombosis seen in the lower extremities, bilaterally. -No evidence of popliteal cyst, bilaterally.   *See table(s) above for measurements and observations. Electronically signed by Servando Snare MD on 03/16/2021 at 6:00:30 PM.    Final       Subjective: NO n/v. Ha all over body generalize ache. No sob or cp. Has chest wall pain from rib fx.  Discharge Exam:  Vitals:   03/17/21 1555 03/17/21 2018 03/17/21 2053 03/18/21 0605  BP: (!) 92/50 (!) 101/56  (!) 95/52  Pulse: 89 93  86  Resp: 18 (!) 22  16  Temp:    98 F (36.7 C)  TempSrc:    Axillary  SpO2: 98% 97% 99% 97%  Weight:      Height:        General: Pt lying comfortably in bed & appears in no obvious distress. Cardiovascular: S1 & S2 heard, RRR, S1/S2 +. No murmurs, rubs, gallops or clicks. No JVD or pedal edema.  Telemetry personally reviewed: Sinus rhythm.   Respiratory: Clear to auscultation without wheezing, rhonchi or crackles. No increased work of breathing. Abdominal:  Non distended, non tender & soft. No organomegaly or masses appreciated. Normal bowel sounds heard. CNS: Alert and oriented. No focal deficits. Extremities: no edema, no cyanosis    The results of significant diagnostics from this hospitalization (including imaging, microbiology, ancillary and laboratory) are listed below for reference.     Microbiology: Recent Results (from the past 240 hour(s))  Resp Panel by RT-PCR (Flu A&B, Covid) Nasopharyngeal Swab     Status: None   Collection Time: 03/12/21  8:56 PM   Specimen: Nasopharyngeal Swab; Nasopharyngeal(NP) swabs in vial transport medium  Result Value Ref Range Status   SARS Coronavirus 2 by RT PCR NEGATIVE NEGATIVE Final     Comment: (NOTE) SARS-CoV-2 target nucleic acids are NOT DETECTED.  The SARS-CoV-2 RNA is generally detectable in upper respiratory specimens during the acute phase of infection. The lowest concentration of SARS-CoV-2 viral copies this assay can detect is 138 copies/mL. A negative result does not preclude SARS-Cov-2 infection and should not be used as the sole basis for treatment or other patient management decisions. A negative result may occur with  improper specimen collection/handling, submission of specimen other than nasopharyngeal swab, presence of viral mutation(s) within the areas targeted by this assay, and inadequate number of viral copies(<138 copies/mL). A negative result must be combined with clinical observations, patient history, and epidemiological information. The expected result is Negative.  Fact Sheet for Patients:  EntrepreneurPulse.com.au  Fact Sheet for Healthcare Providers:  IncredibleEmployment.be  This test is no t yet approved  or cleared by the [place] and  has been authorized for detection and/or diagnosis of SARS-CoV-2 by FDA under an Emergency Use Authorization (EUA). This EUA will remain  in effect (meaning this test can be used) for the duration of the COVID-19 declaration under Section 564(b)(1) of the Act, 21 U.S.C.section 360bbb-3(b)(1), unless the authorization is terminated  or revoked sooner.       Influenza A by PCR NEGATIVE NEGATIVE Final   Influenza B by PCR NEGATIVE NEGATIVE Final    Comment: (NOTE) The Xpert Xpress SARS-CoV-2/FLU/RSV plus assay is intended as an aid in the diagnosis of influenza from Nasopharyngeal swab specimens and should not be used as a sole basis for treatment. Nasal washings and aspirates are unacceptable for Xpert Xpress SARS-CoV-2/FLU/RSV testing.  Fact Sheet for Patients: EntrepreneurPulse.com.au  Fact Sheet for Healthcare  Providers: IncredibleEmployment.be  This test is not yet approved or cleared by the [place] FDA and has been authorized for detection and/or diagnosis of SARS-CoV-2 by FDA under an Emergency Use Authorization (EUA). This EUA will remain in effect (meaning this test can be used) for the duration of the COVID-19 declaration under Section 564(b)(1) of the Act, 21 U.S.C. section 360bbb-3(b)(1), unless the authorization is terminated or revoked.  Performed at [facility], [address] [place], [place] [address]   Resp Panel by RT-PCR (Flu A&B, Covid) Nasopharyngeal Swab     Status: None   Collection Time: 03/16/21 10:37 AM   Specimen: Nasopharyngeal Swab; Nasopharyngeal(NP) swabs in vial transport medium  Result Value Ref Range Status   SARS Coronavirus 2 by RT PCR NEGATIVE NEGATIVE Final    Comment: (NOTE) SARS-CoV-2 target nucleic acids are NOT DETECTED.  The SARS-CoV-2 RNA is generally detectable in upper respiratory specimens during the acute phase of infection. The lowest concentration of SARS-CoV-2 viral copies this assay can detect is 138 copies/mL. A negative result does not preclude SARS-Cov-2 infection and should not be used as the sole basis for treatment or other patient management decisions. A negative result may occur with  improper specimen collection/handling, submission of specimen other than nasopharyngeal swab, presence of viral mutation(s) within the areas targeted by this assay, and inadequate number of viral copies(<138 copies/mL). A negative result must be combined with clinical observations, patient history, and epidemiological information. The expected result is Negative.  Fact Sheet for Patients:  EntrepreneurPulse.com.au  Fact Sheet for Healthcare Providers:  IncredibleEmployment.be  This test is no t yet approved or cleared by the [place] FDA and  has been  authorized for detection and/or diagnosis of SARS-CoV-2 by FDA under an Emergency Use Authorization (EUA). This EUA will remain  in effect (meaning this test can be used) for the duration of the COVID-19 declaration under Section 564(b)(1) of the Act, 21 U.S.C.section 360bbb-3(b)(1), unless the authorization is terminated  or revoked sooner.       Influenza A by PCR NEGATIVE NEGATIVE Final   Influenza B by PCR NEGATIVE NEGATIVE Final    Comment: (NOTE) The Xpert Xpress SARS-CoV-2/FLU/RSV plus assay is intended as an aid in the diagnosis of influenza from Nasopharyngeal swab specimens and should not be used as a sole basis for treatment. Nasal washings and aspirates are unacceptable for Xpert Xpress SARS-CoV-2/FLU/RSV testing.  Fact Sheet for Patients: EntrepreneurPulse.com.au  Fact Sheet for Healthcare Providers: IncredibleEmployment.be  This test is not yet approved or cleared by the [place] FDA and has been authorized for detection and/or diagnosis of SARS-CoV-2 by FDA under an Emergency  Use Authorization (EUA). This EUA will remain in effect (meaning this test can be used) for the duration of the COVID-19 declaration under Section 564(b)(1) of the Act, 21 U.S.C. section 360bbb-3(b)(1), unless the authorization is terminated or revoked.  Performed at [facility], [place] [address], [place], [place] [address]      Labs: CBC: Recent Labs  Lab 03/12/21 2047 03/12/21 2252 03/13/21 0618 03/14/21 0219 03/15/21 0218 03/16/21 0211  WBC 15.7*  --  14.5* 12.3* 12.5* 10.1  NEUTROABS 13.5*  --   --   --   --   --   HGB 14.3 15.3* 14.5 14.2 13.5 12.9  HCT 45.5 45.0 44.8 43.9 42.7 40.7  MCV 88.0  --  88.0 87.3 90.9 90.6  PLT 441*  --  392 453* 524* 512*    Basic Metabolic Panel: Recent Labs  Lab 03/12/21 2047 03/12/21 2252 03/14/21 0219 03/14/21 1212 03/15/21 0218 03/16/21 0211 03/17/21 0339  NA 135 135 134*  --  133*  131* 132*  K 3.9 3.7 3.2*  --  4.7 5.1 4.5  CL 96*  --  93*  --  96* 96* 100  CO2 26  --  27  --  28 28 22   GLUCOSE 105*  --  111*  --  98 97 119*  BUN 25*  --  36*  --  44* 47* 45*  CREATININE 0.81  --  1.25*  --  1.30* 1.07* 0.98  CALCIUM 8.7*  --  8.8*  --  8.5* 8.3* 8.1*  MG  --   --   --  1.7 2.4  --   --     Liver Function Tests: Recent Labs  Lab 03/12/21 2047  AST 14*  ALT 5  ALKPHOS 68  BILITOT 2.0*  PROT 7.1  ALBUMIN 3.1*     Urinalysis    Component Value Date/Time   COLORURINE ORANGE (A) 03/12/2021 2057   APPEARANCEUR CLEAR 03/12/2021 2057   LABSPEC 1.029 03/12/2021 2057   PHURINE 5.5 03/12/2021 2057   GLUCOSEU NEGATIVE 03/12/2021 2057   HGBUR TRACE (A) 03/12/2021 2057   BILIRUBINUR SMALL (A) 03/12/2021 2057   KETONESUR 40 (A) 03/12/2021 2057   PROTEINUR 30 (A) 03/12/2021 2057   UROBILINOGEN 0.2 04/10/2014 2045   NITRITE NEGATIVE 03/12/2021 2057   LEUKOCYTESUR NEGATIVE 03/12/2021 2057      Time coordinating discharge: 40 minutes  SIGNED:  Nolberto Hanlon, MD,  FACP, [facility], [facility], [facility] (Care Management Physician Certified). [facility]  To contact the attending provider between 7A-7P or the covering provider during after hours 7P-7A, please log into the web site [email or web address] and access using universal [place] password for that web site. If you do not have the password, please call the hospital operator.

## 2021-03-18 DIAGNOSIS — I959 Hypotension, unspecified: Secondary | ICD-10-CM | POA: Diagnosis not present

## 2021-03-18 DIAGNOSIS — G119 Hereditary ataxia, unspecified: Secondary | ICD-10-CM | POA: Diagnosis not present

## 2021-03-18 DIAGNOSIS — J9621 Acute and chronic respiratory failure with hypoxia: Secondary | ICD-10-CM | POA: Diagnosis not present

## 2021-03-18 DIAGNOSIS — I2699 Other pulmonary embolism without acute cor pulmonale: Secondary | ICD-10-CM | POA: Diagnosis not present

## 2021-03-18 DIAGNOSIS — J9610 Chronic respiratory failure, unspecified whether with hypoxia or hypercapnia: Secondary | ICD-10-CM | POA: Diagnosis not present

## 2021-03-18 DIAGNOSIS — R2689 Other abnormalities of gait and mobility: Secondary | ICD-10-CM | POA: Diagnosis not present

## 2021-03-18 DIAGNOSIS — S2249XD Multiple fractures of ribs, unspecified side, subsequent encounter for fracture with routine healing: Secondary | ICD-10-CM | POA: Diagnosis not present

## 2021-03-18 DIAGNOSIS — I2609 Other pulmonary embolism with acute cor pulmonale: Secondary | ICD-10-CM | POA: Diagnosis not present

## 2021-03-18 DIAGNOSIS — I872 Venous insufficiency (chronic) (peripheral): Secondary | ICD-10-CM | POA: Diagnosis not present

## 2021-03-18 DIAGNOSIS — E871 Hypo-osmolality and hyponatremia: Secondary | ICD-10-CM | POA: Diagnosis not present

## 2021-03-18 DIAGNOSIS — I10 Essential (primary) hypertension: Secondary | ICD-10-CM | POA: Diagnosis not present

## 2021-03-18 DIAGNOSIS — W19XXXA Unspecified fall, initial encounter: Secondary | ICD-10-CM | POA: Diagnosis not present

## 2021-03-18 DIAGNOSIS — S2249XA Multiple fractures of ribs, unspecified side, initial encounter for closed fracture: Secondary | ICD-10-CM | POA: Diagnosis not present

## 2021-03-18 DIAGNOSIS — I152 Hypertension secondary to endocrine disorders: Secondary | ICD-10-CM | POA: Diagnosis not present

## 2021-03-18 DIAGNOSIS — U071 COVID-19: Secondary | ICD-10-CM | POA: Diagnosis not present

## 2021-03-18 DIAGNOSIS — R279 Unspecified lack of coordination: Secondary | ICD-10-CM | POA: Diagnosis not present

## 2021-03-18 DIAGNOSIS — G039 Meningitis, unspecified: Secondary | ICD-10-CM | POA: Diagnosis not present

## 2021-03-18 DIAGNOSIS — E43 Unspecified severe protein-calorie malnutrition: Secondary | ICD-10-CM | POA: Diagnosis not present

## 2021-03-18 DIAGNOSIS — R41841 Cognitive communication deficit: Secondary | ICD-10-CM | POA: Diagnosis not present

## 2021-03-18 DIAGNOSIS — Z743 Need for continuous supervision: Secondary | ICD-10-CM | POA: Diagnosis not present

## 2021-03-18 DIAGNOSIS — J449 Chronic obstructive pulmonary disease, unspecified: Secondary | ICD-10-CM | POA: Diagnosis not present

## 2021-03-18 DIAGNOSIS — E039 Hypothyroidism, unspecified: Secondary | ICD-10-CM | POA: Diagnosis not present

## 2021-03-18 DIAGNOSIS — E785 Hyperlipidemia, unspecified: Secondary | ICD-10-CM | POA: Diagnosis not present

## 2021-03-18 DIAGNOSIS — R1312 Dysphagia, oropharyngeal phase: Secondary | ICD-10-CM | POA: Diagnosis not present

## 2021-03-18 DIAGNOSIS — M6281 Muscle weakness (generalized): Secondary | ICD-10-CM | POA: Diagnosis not present

## 2021-03-18 DIAGNOSIS — R262 Difficulty in walking, not elsewhere classified: Secondary | ICD-10-CM | POA: Diagnosis not present

## 2021-03-18 DIAGNOSIS — J439 Emphysema, unspecified: Secondary | ICD-10-CM | POA: Diagnosis not present

## 2021-03-18 DIAGNOSIS — R531 Weakness: Secondary | ICD-10-CM | POA: Diagnosis not present

## 2021-03-18 NOTE — Progress Notes (Signed)
Report called to Elberta Fortis, nurse at [facility].

## 2021-03-18 NOTE — Progress Notes (Signed)
Occupational Therapy Treatment Patient Details Name: [name] MRN: [id number] DOB: [birth year] Today's Date: 03/18/2021   History of present illness Pt. is 87 yr old F admitted on 03/12/21 s/p fall with c/o L sided chest pain.  Imaging (+) for PE, L 5-7 rib fx, T3 compression fx. PMH: anemia, asthma, CHF, COPD, DM, FM, breast CA, DVT, PE, HTN, neuropathy, osteoporosis   OT comments  Pt with increased nausea limiting ability to participate in therapy. Pt able to roll R/L for pericare and bed linen change. Pt min A for UB dressing, able to don gown in supine position. Pt continues to present with impairments listed below, will follow acutely. Recommend SNF at d/c.   Recommendations for follow up therapy are one component of a multi-disciplinary discharge planning process, led by the attending physician.  Recommendations may be updated based on patient status, additional functional criteria and insurance authorization.    Follow Up Recommendations  Skilled nursing-short term rehab (<3 hours/day)    Assistance Recommended at Discharge Intermittent Supervision/Assistance  Patient can return home with the following  A lot of help with walking and/or transfers;A lot of help with bathing/dressing/bathroom;Help with stairs or ramp for entrance;Assist for transportation;Assistance with cooking/housework   Equipment Recommendations  Other (comment);None recommended by OT (defer to next venue of care)    Recommendations for Other Services PT consult    Precautions / Restrictions Precautions Precautions: Fall Restrictions Weight Bearing Restrictions: No Other Position/Activity Restrictions: Age indeterminate compression fx on imaging, per ortho no brace needed       Mobility Bed Mobility Overal bed mobility: Needs Assistance Bed Mobility: Rolling Rolling: Supervision         General bed mobility comments: increased time needed due to nausea    Transfers                    General transfer comment: deferred due to nausea     Balance                                           ADL either performed or assessed with clinical judgement   ADL                   Upper Body Dressing : Minimal assistance;Bed level Upper Body Dressing Details (indicate cue type and reason): min A for sleeve mgmt         Toileting- Clothing Manipulation and Hygiene: Maximal assistance;Bed level Toileting - Clothing Manipulation Details (indicate cue type and reason): pericare completed in sidelying       General ADL Comments: pt with increased nausea inhibiting ability to sti EOB this session    Extremity/Trunk Assessment Upper Extremity Assessment Upper Extremity Assessment: Generalized weakness   Lower Extremity Assessment Lower Extremity Assessment: Defer to PT evaluation        Vision   Vision Assessment?: No apparent visual deficits   Perception Perception Perception: Not tested   Praxis Praxis Praxis: Not tested    Cognition Arousal/Alertness: Lethargic Behavior During Therapy: [facility] for tasks assessed/performed                                            Exercises      Shoulder Instructions  General Comments pt VSS throughout session    Pertinent Vitals/ Pain       Pain Assessment Pain Assessment: No/denies pain  Home Living                                          Prior Functioning/Environment              Frequency  Min 2X/week        Progress Toward Goals  OT Goals(current goals can now be found in the care plan section)  Progress towards OT goals: Progressing toward goals  Acute Rehab OT Goals Patient Stated Goal: decrease nausea OT Goal Formulation: With patient Time For Goal Achievement: 03/29/21 Potential to Achieve Goals: Good ADL Goals Pt Will Perform Upper Body Dressing: with min assist;sitting Pt Will Perform Lower Body Dressing: sit to/from  stand;with mod assist Pt Will Transfer to Toilet: with mod assist;stand pivot transfer;bedside commode  Plan Discharge plan remains appropriate;Frequency remains appropriate    Co-evaluation                 AM-PAC OT "6 Clicks" Daily Activity     Outcome Measure   Help from another person eating meals?: None Help from another person taking care of personal grooming?: None Help from another person toileting, which includes using toliet, bedpan, or urinal?: A Lot Help from another person bathing (including washing, rinsing, drying)?: A Lot Help from another person to put on and taking off regular upper body clothing?: A Little Help from another person to put on and taking off regular lower body clothing?: A Lot 6 Click Score: 17    End of Session Equipment Utilized During Treatment: Oxygen  OT Visit Diagnosis: Unsteadiness on feet (R26.81);Other abnormalities of gait and mobility (R26.89);Repeated falls (R29.6);Muscle weakness (generalized) (M62.81);History of falling (Z91.81);Pain   Activity Tolerance Other (comment) (limited by nausea)   Patient Left in bed;with call bell/phone within reach;with bed alarm set;with nursing/sitter in room   Nurse Communication Mobility status;Patient requests pain meds        Time: 8675-4492 OT Time Calculation (min): 20 min  Charges: OT General Charges $OT Visit: 1 Visit OT Treatments $Self Care/Home Management : 8-22 mins  Lynnda Child, OTD, OTR/L Acute Rehab [phone] - [place] 03/18/2021, 9:54 AM

## 2021-03-18 NOTE — TOC Transition Note (Signed)
Transition of Care [facility]) - CM/SW Discharge Note   Patient Details  Name: ALEESE KAMPS MRN: [id number] Date of Birth: [birth year]  [facility]) CM/SW Contact:  Joanne Chars, LCSW Phone Number: 03/18/2021, 10:03 AM   Clinical Narrative:   Pt discharging to [facility]  RN call report to [phone],    Final next level of care: Skilled Nursing Facility Barriers to Discharge: Barriers Resolved   Patient Goals and CMS Choice Patient states their goals for this hospitalization and ongoing recovery are:: "100%" CMS Medicare.gov Compare Post Acute Care list provided to:: Patient Choice offered to / list presented to : Patient  Discharge Placement              Patient chooses bed at:  [facility]) Patient to be transferred to facility by: [place] Name of family member notified: unable to reach son Cherlynn Kaiser, unable to leave message today Patient and family notified of of transfer: 03/17/21  Discharge Plan and Services In-house Referral: Clinical Social Work   Post Acute Care Choice: [place]                               Social Determinants of Health (SDOH) Interventions     Readmission Risk Interventions No flowsheet data found.

## 2021-03-19 DIAGNOSIS — I2699 Other pulmonary embolism without acute cor pulmonale: Secondary | ICD-10-CM | POA: Diagnosis not present

## 2021-03-19 DIAGNOSIS — S2249XD Multiple fractures of ribs, unspecified side, subsequent encounter for fracture with routine healing: Secondary | ICD-10-CM | POA: Diagnosis not present

## 2021-03-19 DIAGNOSIS — J9621 Acute and chronic respiratory failure with hypoxia: Secondary | ICD-10-CM | POA: Diagnosis not present

## 2021-03-19 DIAGNOSIS — I959 Hypotension, unspecified: Secondary | ICD-10-CM | POA: Diagnosis not present

## 2021-03-19 LAB — VITAMIN K1, SERUM: VITAMIN K1: 0.93 ng/mL (ref 0.10–2.20)

## 2021-03-20 LAB — VITAMIN E
Vitamin E (Alpha Tocopherol): 9.1 mg/L (ref 9.0–29.0)
Vitamin E(Gamma Tocopherol): 1.8 mg/L (ref 0.5–4.9)

## 2021-03-20 LAB — VITAMIN C: Vitamin C: 0.3 mg/dL — ABNORMAL LOW (ref 0.4–2.0)

## 2021-03-20 LAB — VITAMIN A: Vitamin A (Retinoic Acid): 10.1 ug/dL — ABNORMAL LOW (ref 22.0–69.5)

## 2021-03-23 LAB — MISC LABCORP TEST (SEND OUT): Labcorp test code: 70097

## 2021-03-25 DIAGNOSIS — G039 Meningitis, unspecified: Secondary | ICD-10-CM | POA: Diagnosis not present

## 2021-03-25 DIAGNOSIS — G119 Hereditary ataxia, unspecified: Secondary | ICD-10-CM | POA: Diagnosis not present

## 2021-03-25 DIAGNOSIS — I10 Essential (primary) hypertension: Secondary | ICD-10-CM | POA: Diagnosis not present

## 2021-03-25 DIAGNOSIS — J449 Chronic obstructive pulmonary disease, unspecified: Secondary | ICD-10-CM | POA: Diagnosis not present

## 2021-03-26 DIAGNOSIS — E039 Hypothyroidism, unspecified: Secondary | ICD-10-CM | POA: Diagnosis not present

## 2021-03-26 DIAGNOSIS — E785 Hyperlipidemia, unspecified: Secondary | ICD-10-CM | POA: Diagnosis not present

## 2021-03-26 DIAGNOSIS — I872 Venous insufficiency (chronic) (peripheral): Secondary | ICD-10-CM | POA: Diagnosis not present

## 2021-03-26 DIAGNOSIS — E43 Unspecified severe protein-calorie malnutrition: Secondary | ICD-10-CM | POA: Diagnosis not present

## 2021-03-30 DIAGNOSIS — J449 Chronic obstructive pulmonary disease, unspecified: Secondary | ICD-10-CM | POA: Diagnosis not present

## 2021-03-30 DIAGNOSIS — E871 Hypo-osmolality and hyponatremia: Secondary | ICD-10-CM | POA: Diagnosis not present

## 2021-03-30 DIAGNOSIS — I2699 Other pulmonary embolism without acute cor pulmonale: Secondary | ICD-10-CM | POA: Diagnosis not present

## 2021-03-30 DIAGNOSIS — J9610 Chronic respiratory failure, unspecified whether with hypoxia or hypercapnia: Secondary | ICD-10-CM | POA: Diagnosis not present

## 2021-04-02 DIAGNOSIS — J9610 Chronic respiratory failure, unspecified whether with hypoxia or hypercapnia: Secondary | ICD-10-CM | POA: Diagnosis not present

## 2021-04-02 DIAGNOSIS — U071 COVID-19: Secondary | ICD-10-CM | POA: Diagnosis not present

## 2021-04-02 DIAGNOSIS — I152 Hypertension secondary to endocrine disorders: Secondary | ICD-10-CM | POA: Diagnosis not present

## 2021-04-06 DIAGNOSIS — S2249XA Multiple fractures of ribs, unspecified side, initial encounter for closed fracture: Secondary | ICD-10-CM | POA: Diagnosis not present

## 2021-04-06 DIAGNOSIS — J449 Chronic obstructive pulmonary disease, unspecified: Secondary | ICD-10-CM | POA: Diagnosis not present

## 2021-04-06 DIAGNOSIS — I2699 Other pulmonary embolism without acute cor pulmonale: Secondary | ICD-10-CM | POA: Diagnosis not present

## 2021-04-06 DIAGNOSIS — J9610 Chronic respiratory failure, unspecified whether with hypoxia or hypercapnia: Secondary | ICD-10-CM | POA: Diagnosis not present

## 2021-04-10 DIAGNOSIS — J449 Chronic obstructive pulmonary disease, unspecified: Secondary | ICD-10-CM | POA: Diagnosis not present

## 2021-04-10 DIAGNOSIS — Z86711 Personal history of pulmonary embolism: Secondary | ICD-10-CM | POA: Diagnosis not present

## 2021-04-10 DIAGNOSIS — Z7951 Long term (current) use of inhaled steroids: Secondary | ICD-10-CM | POA: Diagnosis not present

## 2021-04-10 DIAGNOSIS — S2242XD Multiple fractures of ribs, left side, subsequent encounter for fracture with routine healing: Secondary | ICD-10-CM | POA: Diagnosis not present

## 2021-04-10 DIAGNOSIS — W19XXXD Unspecified fall, subsequent encounter: Secondary | ICD-10-CM | POA: Diagnosis not present

## 2021-04-10 DIAGNOSIS — Z9981 Dependence on supplemental oxygen: Secondary | ICD-10-CM | POA: Diagnosis not present

## 2021-04-10 DIAGNOSIS — Z7901 Long term (current) use of anticoagulants: Secondary | ICD-10-CM | POA: Diagnosis not present

## 2021-04-13 DIAGNOSIS — I959 Hypotension, unspecified: Secondary | ICD-10-CM | POA: Diagnosis not present

## 2021-04-13 DIAGNOSIS — J9610 Chronic respiratory failure, unspecified whether with hypoxia or hypercapnia: Secondary | ICD-10-CM | POA: Diagnosis not present

## 2021-04-13 DIAGNOSIS — I4729 Other ventricular tachycardia: Secondary | ICD-10-CM | POA: Diagnosis not present

## 2021-04-13 DIAGNOSIS — S2242XD Multiple fractures of ribs, left side, subsequent encounter for fracture with routine healing: Secondary | ICD-10-CM | POA: Diagnosis not present

## 2021-04-13 DIAGNOSIS — E039 Hypothyroidism, unspecified: Secondary | ICD-10-CM | POA: Diagnosis not present

## 2021-04-13 DIAGNOSIS — D649 Anemia, unspecified: Secondary | ICD-10-CM | POA: Diagnosis not present

## 2021-04-13 DIAGNOSIS — M170 Bilateral primary osteoarthritis of knee: Secondary | ICD-10-CM | POA: Diagnosis not present

## 2021-04-13 DIAGNOSIS — E1159 Type 2 diabetes mellitus with other circulatory complications: Secondary | ICD-10-CM | POA: Diagnosis not present

## 2021-04-13 DIAGNOSIS — I2721 Secondary pulmonary arterial hypertension: Secondary | ICD-10-CM | POA: Diagnosis not present

## 2021-04-13 DIAGNOSIS — M810 Age-related osteoporosis without current pathological fracture: Secondary | ICD-10-CM | POA: Diagnosis not present

## 2021-04-13 DIAGNOSIS — M797 Fibromyalgia: Secondary | ICD-10-CM | POA: Diagnosis not present

## 2021-04-13 DIAGNOSIS — Z9981 Dependence on supplemental oxygen: Secondary | ICD-10-CM | POA: Diagnosis not present

## 2021-04-13 DIAGNOSIS — I5032 Chronic diastolic (congestive) heart failure: Secondary | ICD-10-CM | POA: Diagnosis not present

## 2021-04-13 DIAGNOSIS — I4891 Unspecified atrial fibrillation: Secondary | ICD-10-CM | POA: Diagnosis not present

## 2021-04-13 DIAGNOSIS — E46 Unspecified protein-calorie malnutrition: Secondary | ICD-10-CM | POA: Diagnosis not present

## 2021-04-13 DIAGNOSIS — I152 Hypertension secondary to endocrine disorders: Secondary | ICD-10-CM | POA: Diagnosis not present

## 2021-04-13 DIAGNOSIS — E538 Deficiency of other specified B group vitamins: Secondary | ICD-10-CM | POA: Diagnosis not present

## 2021-04-13 DIAGNOSIS — E1140 Type 2 diabetes mellitus with diabetic neuropathy, unspecified: Secondary | ICD-10-CM | POA: Diagnosis not present

## 2021-04-13 DIAGNOSIS — G8929 Other chronic pain: Secondary | ICD-10-CM | POA: Diagnosis not present

## 2021-04-13 DIAGNOSIS — I2699 Other pulmonary embolism without acute cor pulmonale: Secondary | ICD-10-CM | POA: Diagnosis not present

## 2021-04-13 DIAGNOSIS — Z7901 Long term (current) use of anticoagulants: Secondary | ICD-10-CM | POA: Diagnosis not present

## 2021-04-13 DIAGNOSIS — Z79899 Other long term (current) drug therapy: Secondary | ICD-10-CM | POA: Diagnosis not present

## 2021-04-13 DIAGNOSIS — Z7951 Long term (current) use of inhaled steroids: Secondary | ICD-10-CM | POA: Diagnosis not present

## 2021-04-13 DIAGNOSIS — J449 Chronic obstructive pulmonary disease, unspecified: Secondary | ICD-10-CM | POA: Diagnosis not present

## 2021-04-19 ENCOUNTER — Inpatient Hospital Stay (HOSPITAL_COMMUNITY)
Admission: EM | Admit: 2021-04-19 | Discharge: 2021-04-22 | DRG: 190 | Disposition: A | Discharge status: Home / Self Care | Payer: PPO | Attending: Internal Medicine | Admitting: Internal Medicine

## 2021-04-19 ENCOUNTER — Other Ambulatory Visit: Payer: Self-pay

## 2021-04-19 ENCOUNTER — Emergency Department (HOSPITAL_COMMUNITY): Payer: PPO

## 2021-04-19 DIAGNOSIS — I82442 Acute embolism and thrombosis of left tibial vein: Secondary | ICD-10-CM | POA: Diagnosis present

## 2021-04-19 DIAGNOSIS — Z66 Do not resuscitate: Secondary | ICD-10-CM | POA: Diagnosis present

## 2021-04-19 DIAGNOSIS — R778 Other specified abnormalities of plasma proteins: Secondary | ICD-10-CM | POA: Diagnosis present

## 2021-04-19 DIAGNOSIS — E1142 Type 2 diabetes mellitus with diabetic polyneuropathy: Secondary | ICD-10-CM | POA: Diagnosis present

## 2021-04-19 DIAGNOSIS — S2242XA Multiple fractures of ribs, left side, initial encounter for closed fracture: Secondary | ICD-10-CM | POA: Diagnosis present

## 2021-04-19 DIAGNOSIS — M797 Fibromyalgia: Secondary | ICD-10-CM | POA: Diagnosis not present

## 2021-04-19 DIAGNOSIS — Z9842 Cataract extraction status, left eye: Secondary | ICD-10-CM

## 2021-04-19 DIAGNOSIS — Z79899 Other long term (current) drug therapy: Secondary | ICD-10-CM

## 2021-04-19 DIAGNOSIS — Z7984 Long term (current) use of oral hypoglycemic drugs: Secondary | ICD-10-CM | POA: Diagnosis not present

## 2021-04-19 DIAGNOSIS — I5032 Chronic diastolic (congestive) heart failure: Secondary | ICD-10-CM | POA: Diagnosis present

## 2021-04-19 DIAGNOSIS — J441 Chronic obstructive pulmonary disease with (acute) exacerbation: Secondary | ICD-10-CM | POA: Diagnosis not present

## 2021-04-19 DIAGNOSIS — G894 Chronic pain syndrome: Secondary | ICD-10-CM | POA: Diagnosis present

## 2021-04-19 DIAGNOSIS — R079 Chest pain, unspecified: Secondary | ICD-10-CM | POA: Diagnosis not present

## 2021-04-19 DIAGNOSIS — Z9221 Personal history of antineoplastic chemotherapy: Secondary | ICD-10-CM

## 2021-04-19 DIAGNOSIS — M2578 Osteophyte, vertebrae: Secondary | ICD-10-CM | POA: Diagnosis not present

## 2021-04-19 DIAGNOSIS — I9589 Other hypotension: Secondary | ICD-10-CM | POA: Diagnosis not present

## 2021-04-19 DIAGNOSIS — Z7951 Long term (current) use of inhaled steroids: Secondary | ICD-10-CM | POA: Diagnosis not present

## 2021-04-19 DIAGNOSIS — Z880 Allergy status to penicillin: Secondary | ICD-10-CM

## 2021-04-19 DIAGNOSIS — E59 Dietary selenium deficiency: Secondary | ICD-10-CM | POA: Diagnosis present

## 2021-04-19 DIAGNOSIS — I110 Hypertensive heart disease with heart failure: Secondary | ICD-10-CM | POA: Diagnosis not present

## 2021-04-19 DIAGNOSIS — Z9841 Cataract extraction status, right eye: Secondary | ICD-10-CM

## 2021-04-19 DIAGNOSIS — Z803 Family history of malignant neoplasm of breast: Secondary | ICD-10-CM | POA: Diagnosis not present

## 2021-04-19 DIAGNOSIS — L0390 Cellulitis, unspecified: Secondary | ICD-10-CM

## 2021-04-19 DIAGNOSIS — Z86718 Personal history of other venous thrombosis and embolism: Secondary | ICD-10-CM

## 2021-04-19 DIAGNOSIS — Z8249 Family history of ischemic heart disease and other diseases of the circulatory system: Secondary | ICD-10-CM

## 2021-04-19 DIAGNOSIS — Z86711 Personal history of pulmonary embolism: Secondary | ICD-10-CM | POA: Diagnosis not present

## 2021-04-19 DIAGNOSIS — E1140 Type 2 diabetes mellitus with diabetic neuropathy, unspecified: Secondary | ICD-10-CM

## 2021-04-19 DIAGNOSIS — R609 Edema, unspecified: Secondary | ICD-10-CM | POA: Diagnosis not present

## 2021-04-19 DIAGNOSIS — J90 Pleural effusion, not elsewhere classified: Secondary | ICD-10-CM | POA: Diagnosis not present

## 2021-04-19 DIAGNOSIS — R7989 Other specified abnormal findings of blood chemistry: Secondary | ICD-10-CM | POA: Diagnosis not present

## 2021-04-19 DIAGNOSIS — J849 Interstitial pulmonary disease, unspecified: Secondary | ICD-10-CM | POA: Diagnosis present

## 2021-04-19 DIAGNOSIS — E785 Hyperlipidemia, unspecified: Secondary | ICD-10-CM | POA: Diagnosis not present

## 2021-04-19 DIAGNOSIS — R0902 Hypoxemia: Secondary | ICD-10-CM | POA: Diagnosis not present

## 2021-04-19 DIAGNOSIS — Z885 Allergy status to narcotic agent status: Secondary | ICD-10-CM

## 2021-04-19 DIAGNOSIS — R5381 Other malaise: Secondary | ICD-10-CM | POA: Diagnosis not present

## 2021-04-19 DIAGNOSIS — Z961 Presence of intraocular lens: Secondary | ICD-10-CM | POA: Diagnosis present

## 2021-04-19 DIAGNOSIS — K589 Irritable bowel syndrome without diarrhea: Secondary | ICD-10-CM | POA: Diagnosis present

## 2021-04-19 DIAGNOSIS — R0602 Shortness of breath: Secondary | ICD-10-CM | POA: Diagnosis not present

## 2021-04-19 DIAGNOSIS — I959 Hypotension, unspecified: Secondary | ICD-10-CM

## 2021-04-19 DIAGNOSIS — E039 Hypothyroidism, unspecified: Secondary | ICD-10-CM | POA: Diagnosis present

## 2021-04-19 DIAGNOSIS — J9621 Acute and chronic respiratory failure with hypoxia: Secondary | ICD-10-CM | POA: Diagnosis not present

## 2021-04-19 DIAGNOSIS — L03116 Cellulitis of left lower limb: Secondary | ICD-10-CM | POA: Diagnosis present

## 2021-04-19 DIAGNOSIS — Z83438 Family history of other disorder of lipoprotein metabolism and other lipidemia: Secondary | ICD-10-CM

## 2021-04-19 DIAGNOSIS — M4316 Spondylolisthesis, lumbar region: Secondary | ICD-10-CM | POA: Diagnosis not present

## 2021-04-19 DIAGNOSIS — E43 Unspecified severe protein-calorie malnutrition: Secondary | ICD-10-CM | POA: Diagnosis not present

## 2021-04-19 DIAGNOSIS — Z825 Family history of asthma and other chronic lower respiratory diseases: Secondary | ICD-10-CM

## 2021-04-19 DIAGNOSIS — R000 Tachycardia, unspecified: Secondary | ICD-10-CM | POA: Diagnosis not present

## 2021-04-19 DIAGNOSIS — M40204 Unspecified kyphosis, thoracic region: Secondary | ICD-10-CM | POA: Diagnosis not present

## 2021-04-19 DIAGNOSIS — Z923 Personal history of irradiation: Secondary | ICD-10-CM

## 2021-04-19 DIAGNOSIS — Z743 Need for continuous supervision: Secondary | ICD-10-CM | POA: Diagnosis not present

## 2021-04-19 DIAGNOSIS — I272 Pulmonary hypertension, unspecified: Secondary | ICD-10-CM | POA: Diagnosis not present

## 2021-04-19 DIAGNOSIS — E119 Type 2 diabetes mellitus without complications: Secondary | ICD-10-CM | POA: Diagnosis not present

## 2021-04-19 DIAGNOSIS — Z20822 Contact with and (suspected) exposure to covid-19: Secondary | ICD-10-CM | POA: Diagnosis not present

## 2021-04-19 DIAGNOSIS — Z7901 Long term (current) use of anticoagulants: Secondary | ICD-10-CM | POA: Diagnosis not present

## 2021-04-19 DIAGNOSIS — M810 Age-related osteoporosis without current pathological fracture: Secondary | ICD-10-CM | POA: Diagnosis not present

## 2021-04-19 DIAGNOSIS — Z820 Family history of epilepsy and other diseases of the nervous system: Secondary | ICD-10-CM

## 2021-04-19 DIAGNOSIS — Z853 Personal history of malignant neoplasm of breast: Secondary | ICD-10-CM

## 2021-04-19 DIAGNOSIS — Z9049 Acquired absence of other specified parts of digestive tract: Secondary | ICD-10-CM

## 2021-04-19 DIAGNOSIS — Z881 Allergy status to other antibiotic agents status: Secondary | ICD-10-CM

## 2021-04-19 DIAGNOSIS — M533 Sacrococcygeal disorders, not elsewhere classified: Secondary | ICD-10-CM | POA: Diagnosis not present

## 2021-04-19 DIAGNOSIS — Z79890 Hormone replacement therapy (postmenopausal): Secondary | ICD-10-CM

## 2021-04-19 DIAGNOSIS — S0990XA Unspecified injury of head, initial encounter: Secondary | ICD-10-CM | POA: Diagnosis not present

## 2021-04-19 DIAGNOSIS — R0789 Other chest pain: Secondary | ICD-10-CM | POA: Diagnosis not present

## 2021-04-19 DIAGNOSIS — Z8041 Family history of malignant neoplasm of ovary: Secondary | ICD-10-CM

## 2021-04-19 DIAGNOSIS — Z6823 Body mass index (BMI) 23.0-23.9, adult: Secondary | ICD-10-CM

## 2021-04-19 DIAGNOSIS — R0603 Acute respiratory distress: Secondary | ICD-10-CM | POA: Diagnosis present

## 2021-04-19 LAB — CBC WITH DIFFERENTIAL/PLATELET
Abs Immature Granulocytes: 0.02 10*3/uL (ref 0.00–0.07)
Basophils Absolute: 0.1 10*3/uL (ref 0.0–0.1)
Basophils Relative: 1 %
Eosinophils Absolute: 0.1 10*3/uL (ref 0.0–0.5)
Eosinophils Relative: 1 %
HCT: 44.2 % (ref 36.0–46.0)
Hemoglobin: 13.4 g/dL (ref 12.0–15.0)
Immature Granulocytes: 0 %
Lymphocytes Relative: 11 %
Lymphs Abs: 1.1 10*3/uL (ref 0.7–4.0)
MCH: 26.9 pg (ref 26.0–34.0)
MCHC: 30.3 g/dL (ref 30.0–36.0)
MCV: 88.6 fL (ref 80.0–100.0)
Monocytes Absolute: 0.6 10*3/uL (ref 0.1–1.0)
Monocytes Relative: 6 %
Neutro Abs: 8.1 10*3/uL — ABNORMAL HIGH (ref 1.7–7.7)
Neutrophils Relative %: 81 %
Platelets: 455 10*3/uL — ABNORMAL HIGH (ref 150–400)
RBC: 4.99 MIL/uL (ref 3.87–5.11)
RDW: 19 % — ABNORMAL HIGH (ref 11.5–15.5)
WBC: 9.9 10*3/uL (ref 4.0–10.5)
nRBC: 0 % (ref 0.0–0.2)

## 2021-04-19 LAB — TROPONIN I (HIGH SENSITIVITY)
Troponin I (High Sensitivity): 32 ng/L — ABNORMAL HIGH (ref <18)
Troponin I (High Sensitivity): 49 ng/L — ABNORMAL HIGH (ref <18)

## 2021-04-19 LAB — COMPREHENSIVE METABOLIC PANEL
ALT: 15 U/L (ref 0–44)
AST: 34 U/L (ref 15–41)
Albumin: 2.9 g/dL — ABNORMAL LOW (ref 3.5–5.0)
Alkaline Phosphatase: 84 U/L (ref 38–126)
Anion gap: 11 (ref 5–15)
BUN: 21 mg/dL (ref 8–23)
CO2: 25 mmol/L (ref 22–32)
Calcium: 8.2 mg/dL — ABNORMAL LOW (ref 8.9–10.3)
Chloride: 100 mmol/L (ref 98–111)
Creatinine, Ser: 0.78 mg/dL (ref 0.44–1.00)
GFR, Estimated: >60 mL/min (ref >60)
Glucose, Bld: 113 mg/dL — ABNORMAL HIGH (ref 70–99)
Potassium: 3.8 mmol/L (ref 3.5–5.1)
Sodium: 136 mmol/L (ref 135–145)
Total Bilirubin: 1.2 mg/dL (ref 0.3–1.2)
Total Protein: 7 g/dL (ref 6.5–8.1)

## 2021-04-19 LAB — BRAIN NATRIURETIC PEPTIDE: B Natriuretic Peptide: 545.1 pg/mL — ABNORMAL HIGH (ref 0.0–100.0)

## 2021-04-19 MED ORDER — IPRATROPIUM-ALBUTEROL 0.5-2.5 (3) MG/3ML IN SOLN
3.0000 mL | Freq: Once | RESPIRATORY_TRACT | Status: AC
  Administered 2021-04-19: 3 mL via RESPIRATORY_TRACT
  Filled 2021-04-19: qty 3

## 2021-04-19 MED ORDER — FENTANYL CITRATE PF 50 MCG/ML IJ SOSY
25.0000 ug | PREFILLED_SYRINGE | Freq: Once | INTRAMUSCULAR | Status: AC
  Administered 2021-04-19: 25 ug via INTRAVENOUS
  Filled 2021-04-19: qty 1

## 2021-04-19 MED ORDER — METHYLPREDNISOLONE SODIUM SUCC 125 MG IJ SOLR
125.0000 mg | Freq: Once | INTRAMUSCULAR | Status: AC
  Administered 2021-04-19: 125 mg via INTRAVENOUS
  Filled 2021-04-19: qty 2

## 2021-04-19 NOTE — ED Provider Notes (Signed)
[name] Provider Note  History   Chief Complaint  Patient presents with   Respiratory Distress   The history is provided by the patient and a relative.  Illness Location:  SOB Quality:  "breathing fast and hard" Severity:  Moderate Onset quality:  Gradual Duration:  1 day Timing:  Constant Progression:  Worsening Chronicity:  Recurrent Context:  See MDM Associated symptoms: shortness of breath   Associated symptoms: no abdominal pain, no chest pain, no cough, no diarrhea, no ear pain, no fever, no nausea, no rash, no sore throat and no vomiting     Past Medical History:  Diagnosis Date   Anemia    Asthma    CHF (congestive heart failure) (HCC)    COPD (chronic obstructive pulmonary disease) (HCC)    Diabetes mellitus without complication (HCC)    Fibromyalgia    History of breast cancer LEFT BREAST DCIS  1996   History of DVT (deep vein thrombosis)    History of pulmonary embolism    Hyperlipidemia    Hypertension    IBS (irritable bowel syndrome)    Lung mass PER DR CLANCE NOTE UNCLEAR IF LYMPHOMA FROM BX   FOLLOWED BY DR RUBIN   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   Osteoporosis    PONV (postoperative nausea and vomiting)    Vertebral compression fracture (HCC) 06/2012   L4    Social History   Tobacco Use   Smoking status: Former    Packs/day: 1.00    Years: 30.00    Pack years: 30.00    Types: Cigarettes    Quit date: 03/01/1978    Years since quitting: 43.1   Smokeless tobacco: Never  Vaping Use   Vaping Use: Never used  Substance Use Topics   Alcohol use: Yes    Alcohol/week: 7.0 - 10.0 standard drinks    Types: 7 - 10 Glasses of wine per week    Comment: 1 glass of wine daily   Drug use: No     Family History  Problem Relation Age of Onset   Emphysema Father    CAD Father    Ovarian cancer Mother    Cancer Mother        cervical   Alzheimer's disease  Mother    Hyperlipidemia Mother    Breast cancer Mother    Cancer Sister        cervical   Breast cancer Sister    Breast cancer Maternal Grandmother    Breast cancer Maternal Aunt     Review of Systems  Constitutional:  Negative for chills and fever.  HENT:  Negative for ear pain and sore throat.   Eyes:  Negative for pain and visual disturbance.  Respiratory:  Positive for shortness of breath. Negative for cough.   Cardiovascular:  Positive for leg swelling ((chronic)). Negative for chest pain and palpitations.  Gastrointestinal:  Negative for abdominal pain, diarrhea, nausea and vomiting.  Endocrine: Negative.   Genitourinary:  Negative for dysuria and hematuria.  Musculoskeletal:  Negative for arthralgias, neck pain and neck stiffness.  Skin:  Negative for color change and rash.  Allergic/Immunologic: Negative.   Neurological:  Negative for seizures and syncope.  Hematological: Negative.   Psychiatric/Behavioral: Negative.    All other systems reviewed and are negative.   Physical Exam   Today's Vitals   04/20/21 0649 04/20/21 0741 04/20/21 0848 04/20/21 1335  BP: (!) 95/54 (!) 102/54  Pulse: 81 77    Resp: 16 17    Temp:  98.4 F (36.9 C)    TempSrc:  Oral    SpO2: 98% 97% 92% 99%  Weight:      Height:      PainSc:   0-No pain      Physical Exam Vitals and nursing note reviewed.  Constitutional:      General: She is not in acute distress.    Appearance: Normal appearance. She is well-developed. She is not toxic-appearing.     Comments: Chronically ill-appearing  HENT:     Head: Normocephalic and atraumatic.     Nose: Nose normal. No congestion.     Mouth/Throat:     Mouth: Mucous membranes are moist.     Pharynx: Oropharynx is clear. No oropharyngeal exudate.  Eyes:     Extraocular Movements: Extraocular movements intact.     Conjunctiva/sclera: Conjunctivae normal.     Pupils: Pupils are equal, round, and reactive to light.  Cardiovascular:      Rate and Rhythm: Regular rhythm. Tachycardia present.     Pulses: Normal pulses.     Heart sounds: Normal heart sounds. No murmur heard. Pulmonary:     Effort: Pulmonary effort is normal. Tachypnea present. No respiratory distress or retractions.     Breath sounds: Examination of the right-lower field reveals decreased breath sounds. Examination of the left-lower field reveals decreased breath sounds. Decreased breath sounds and wheezing present. No rhonchi or rales.  Abdominal:     Palpations: Abdomen is soft.     Tenderness: There is no abdominal tenderness.  Musculoskeletal:        General: No swelling.     Cervical back: Normal range of motion and neck supple. No tenderness.     Right lower leg: Edema present.     Left lower leg: Edema (L>R swelling, pitting to the level of the knee on the L, some weeping, no wounds) present.  Skin:    General: Skin is warm and dry.     Capillary Refill: Capillary refill takes less than 2 seconds.     Findings: No rash.  Neurological:     General: No focal deficit present.     Mental Status: She is alert and oriented to person, place, and time. Mental status is at baseline.     Cranial Nerves: No cranial nerve deficit.     Sensory: No sensory deficit.     Motor: No weakness.  Psychiatric:        Mood and Affect: Mood normal.        Behavior: Behavior normal.    ED Course  Procedures  Medical Decision Making:  KARLIE AUNG is a 87 y.o. female w/ h/o breast cancer, HFpEF (EF 60-65%), COPD (on home 4L O2 [place]), chronic hypoxic respiratory failure, DVT/PE (on Eliquis), DM, HTN, HLD, hypothyroidism, fibromyalgia, who p/w EMS w/ SOB.   Recently admitted on 1/12-18 s/p fall, found to have 3 rib fractures and acute PE.  Discharged on Eliquis, compliant with this medication.  Patient ambulates with a walker.  This morning, she was attempting to bend over and place a book on a low shelf when she lost her balance and fell backwards.  She states she  "fell backwards on her butt and her lower back."  She states she hit her head but "not hard" and did not lose consciousness.  She was unable to stand up from the floor, so she crawled to her couch and  called her son.  Her son came home and assisted her from the floor.  Later this evening, the patient was using her baseline home oxygen when the son took the patient's vital signs and found that she was satting 72%.  He called EMS for transport to the ED. EMS found the patient satting 72% on nasal cannula and placed patient on NRB with improvement of saturations to 100%. Son confirms that she "always breathes fast and hard" but appeared a bit more short of breath today.  Denies preceding infection, denies known sick contacts.  Endorses 1 day of increased cough and increased sputum production.  Endorses compliance with home scheduled medications.  Given SOB, will obtain CXR, EKG, basic labs.  Slight wheezing on examination, will administer DuoNeb.  CXR without PNA, will administer Solu-Medrol for likely COPD exacerbation.  Given that the patient given fall on thinners (and complaints of back pain), will obtain CT head and CT spine.  Exam as above.   ER provider interpretation of Imaging / Radiology:  CXR: Diffuse interstitial prominence in bilateral lungs suggesting ILD, small right pleural effusion, stable left rib fractures. XR coccyx: No evidence of sacral fracture. Compression deformity at L4 and L5 which is age-indeterminate (but new since 2014). CT head: No acute intracranial abnormality CT C/T/L-spine: Compression deformity in the SEP T3 which is unchanged from prior exam.  ER provider interpretation of EKG:  Sinus tachycardia, known RBBB, no ST elevation or ST depression appreciable.  ER provider interpretation of Labs:  CBC without leukocytosis or acute anemia CMP without significant electrolyte abnormality or AKI BNP 545 Delta troponin 32 and second troponin pending COVID/flu:  Pending  Key medications administered in the ER:  DuoNeb for wheezing Fentanyl for pain  Diagnoses considered: Etiology likely: Multifactorial dyspnea, likely COPD exacerbation (given underlying COPD, hypoxia, increased cough, increased sputum production). Considered: CHF, ACS, COPD/asthma exacerbation, PNA, anaphylaxis, PE, PTX.  Obtaining ACS r/o, EKG w/o STEMI, initial trop elevated (but her baseline), pending second trop. No concerns for pericardial tamponade as patient is hemodynamically stable and no indication on EKG. Doubt pericarditis as no pain related to supine or prone positions and no diffuse ST elevation on EKG. Doubt pneumonia as CXR unremarkable for focal airspace disease, afebrile, no cough, no leukocytosis. Doubt PTX given unremarkable CXR. Doubt esophageal tear as CXR unremarkable, no recent intractable emesis, no recent esophageal instrumentation. Doubt perforated abdominal viscus as no peritonitis or free air on CXR. Doubt aortic dissection as pain is not described as tearing and does not radiate to back, pulses present bilaterally in upper and lower extremities, and CXR does not show widened mediastinum. Has known DVT/PE, compliant w/ AC.   Consulted: Not indicated  Patient seen in conjunction with Dr. Nicola Girt medical dictation software was used in the creation of this note.   Electronically signed by: Wynetta Fines, MD on 04/20/2021 at 2:53 PM  Clinical Impression: Multifactorial dyspnea, likely a component of COPD exacerbation  Dispo: Imagene Riches, MD 04/21/21 1405    Lajean Saver, MD 04/23/21 [id number]

## 2021-04-19 NOTE — ED Notes (Signed)
Patient transported to X-ray 

## 2021-04-19 NOTE — ED Triage Notes (Signed)
Pt BIB [facility] from home c/o SOB. Pt typically on 2-3 L [place], EMS stated O2 72% while on Lead. Pt placed on NRB at O2 sats improved to 100%. Pt had a fall earlier today, c/o tailbone pain. A&Ox4

## 2021-04-19 NOTE — ED Notes (Signed)
Patient transported to CT 

## 2021-04-19 NOTE — Subjective & Objective (Addendum)
Patient is a 87 year old female with a past medical history of COPD, chronic diastolic CHF, chronic hypoxic respiratory failure on 4 L home oxygen, DVT/PE, hypertension, hyperlipidemia, type II diabetes, hypothyroidism, anxiety and depression, history of breast cancer.  Recently admitted 03/12/2021 for acute on chronic hypoxic respiratory failure after a mechanical fall.  Work-up revealed left upper lobe PE, 3 left-sided rib fractures, and T3 superior endplate compression fracture.  She was started on Eliquis.  Also had AKI, hyponatremia, hypokalemia, and soft blood pressure.  Hydrochlorothiazide was discontinued.  Creatinine down trended to 0.9 on the day of discharge.  TSH was elevated and dose of home Synthroid was increased from 88 to 100 mcg daily.  She was discharged to SNF on 03/18/2021.  She presents to the ED today via EMS from home complaining of shortness of breath and and tailbone pain in the setting of fall earlier today.  EMS reported oxygen saturation of 72% on home oxygen.  Placed on nonrebreather with improvement of sats to 100%.  In the ED, wheezing on exam and was given DuoNeb treatment and Solu-Medrol 125 mg.  Not febrile.  Labs showing no leukocytosis or anemia.  Platelet count 455K, slightly elevated on previous labs as well.  Creatinine 0.7.  COVID and influenza PCR pending.  BNP 545.  High-sensitivity troponin 32 > 49. EKG without acute ischemic changes.  Chest x-ray showing diffuse interstitial prominence in the lungs bilaterally, suggesting interstitial lung disease, slightly improved from the prior exam.  Also showing small right pleural effusion and stable left rib fractures.  X-ray of sacrum/coccyx showing compression deformity at L5 which is new from 2014 and indeterminate in age.  Also showing compression deformity at L4 with progression since 2014.  CT head negative for acute finding.  CT C-spine negative for acute finding.  CT thoracic spine showing compression deformity in the  superior endplate of T3 which is unchanged from the prior exam.  CT lumbar spine negative for acute finding; showing chronic findings including chronic compression deformities.  Patient was given fentanyl for pain.  Patient states she uses a walker to ambulate.  States this morning as she was putting an item on a desk, she let go of her walker and fell backwards.  Landed on her lower back and also injured her head.  After the fall, she started having a headache and pain in her entire back and tailbone.  Denies loss of consciousness.  Denies preceding lightheadedness/dizziness or chest pain.  She is compliant with Eliquis.  Reports chronic cough and shortness of breath due to COPD and does not think that her symptoms are any worse from baseline.  She uses 4 L oxygen all the time.  Son checked her pulse ox today and was down to the 60s on home oxygen.  He called her PCP who advised them to come to the emergency room.  Patient denies fevers, vomiting, abdominal pain, or dysuria.  Reports chronic diarrhea for the past 10 years due to IBS.  Son states her legs are very swollen and she has scabs on her left lower leg which she has been picking.

## 2021-04-20 DIAGNOSIS — Z86711 Personal history of pulmonary embolism: Secondary | ICD-10-CM | POA: Diagnosis not present

## 2021-04-20 DIAGNOSIS — Z20822 Contact with and (suspected) exposure to covid-19: Secondary | ICD-10-CM | POA: Diagnosis present

## 2021-04-20 DIAGNOSIS — I5032 Chronic diastolic (congestive) heart failure: Secondary | ICD-10-CM | POA: Diagnosis present

## 2021-04-20 DIAGNOSIS — J9621 Acute and chronic respiratory failure with hypoxia: Secondary | ICD-10-CM

## 2021-04-20 DIAGNOSIS — G894 Chronic pain syndrome: Secondary | ICD-10-CM | POA: Diagnosis present

## 2021-04-20 DIAGNOSIS — R5381 Other malaise: Secondary | ICD-10-CM | POA: Diagnosis not present

## 2021-04-20 DIAGNOSIS — L03116 Cellulitis of left lower limb: Secondary | ICD-10-CM | POA: Diagnosis present

## 2021-04-20 DIAGNOSIS — R0603 Acute respiratory distress: Secondary | ICD-10-CM | POA: Diagnosis present

## 2021-04-20 DIAGNOSIS — I272 Pulmonary hypertension, unspecified: Secondary | ICD-10-CM | POA: Diagnosis present

## 2021-04-20 DIAGNOSIS — J849 Interstitial pulmonary disease, unspecified: Secondary | ICD-10-CM | POA: Diagnosis present

## 2021-04-20 DIAGNOSIS — E039 Hypothyroidism, unspecified: Secondary | ICD-10-CM | POA: Diagnosis present

## 2021-04-20 DIAGNOSIS — I110 Hypertensive heart disease with heart failure: Secondary | ICD-10-CM | POA: Diagnosis present

## 2021-04-20 DIAGNOSIS — Z7901 Long term (current) use of anticoagulants: Secondary | ICD-10-CM | POA: Diagnosis not present

## 2021-04-20 DIAGNOSIS — S2242XA Multiple fractures of ribs, left side, initial encounter for closed fracture: Secondary | ICD-10-CM | POA: Diagnosis present

## 2021-04-20 DIAGNOSIS — Z7984 Long term (current) use of oral hypoglycemic drugs: Secondary | ICD-10-CM | POA: Diagnosis not present

## 2021-04-20 DIAGNOSIS — R7989 Other specified abnormal findings of blood chemistry: Secondary | ICD-10-CM | POA: Diagnosis not present

## 2021-04-20 DIAGNOSIS — E785 Hyperlipidemia, unspecified: Secondary | ICD-10-CM | POA: Diagnosis present

## 2021-04-20 DIAGNOSIS — J441 Chronic obstructive pulmonary disease with (acute) exacerbation: Secondary | ICD-10-CM | POA: Diagnosis present

## 2021-04-20 DIAGNOSIS — Z803 Family history of malignant neoplasm of breast: Secondary | ICD-10-CM | POA: Diagnosis not present

## 2021-04-20 DIAGNOSIS — M797 Fibromyalgia: Secondary | ICD-10-CM | POA: Diagnosis present

## 2021-04-20 DIAGNOSIS — I82442 Acute embolism and thrombosis of left tibial vein: Secondary | ICD-10-CM | POA: Diagnosis present

## 2021-04-20 DIAGNOSIS — E119 Type 2 diabetes mellitus without complications: Secondary | ICD-10-CM | POA: Diagnosis not present

## 2021-04-20 DIAGNOSIS — Z7951 Long term (current) use of inhaled steroids: Secondary | ICD-10-CM | POA: Diagnosis not present

## 2021-04-20 DIAGNOSIS — M810 Age-related osteoporosis without current pathological fracture: Secondary | ICD-10-CM | POA: Diagnosis present

## 2021-04-20 DIAGNOSIS — R778 Other specified abnormalities of plasma proteins: Secondary | ICD-10-CM | POA: Diagnosis present

## 2021-04-20 DIAGNOSIS — E59 Dietary selenium deficiency: Secondary | ICD-10-CM | POA: Diagnosis present

## 2021-04-20 DIAGNOSIS — I9589 Other hypotension: Secondary | ICD-10-CM | POA: Diagnosis present

## 2021-04-20 DIAGNOSIS — E43 Unspecified severe protein-calorie malnutrition: Secondary | ICD-10-CM | POA: Diagnosis present

## 2021-04-20 DIAGNOSIS — E1140 Type 2 diabetes mellitus with diabetic neuropathy, unspecified: Secondary | ICD-10-CM

## 2021-04-20 DIAGNOSIS — Z66 Do not resuscitate: Secondary | ICD-10-CM | POA: Diagnosis present

## 2021-04-20 DIAGNOSIS — E1142 Type 2 diabetes mellitus with diabetic polyneuropathy: Secondary | ICD-10-CM | POA: Diagnosis present

## 2021-04-20 LAB — HEMOGLOBIN A1C
Hgb A1c MFr Bld: 5.2 % (ref 4.8–5.6)
Mean Plasma Glucose: 102.54 mg/dL

## 2021-04-20 LAB — CBG MONITORING, ED: Glucose-Capillary: 119 mg/dL — ABNORMAL HIGH (ref 70–99)

## 2021-04-20 LAB — GLUCOSE, CAPILLARY
Glucose-Capillary: 150 mg/dL — ABNORMAL HIGH (ref 70–99)
Glucose-Capillary: 179 mg/dL — ABNORMAL HIGH (ref 70–99)
Glucose-Capillary: 125 mg/dL — ABNORMAL HIGH (ref 70–99)

## 2021-04-20 LAB — D-DIMER, QUANTITATIVE: D-Dimer, Quant: 3.47 ug/mL-FEU — ABNORMAL HIGH (ref 0.00–0.50)

## 2021-04-20 LAB — HIV ANTIBODY (ROUTINE TESTING W REFLEX): HIV Screen 4th Generation wRfx: NONREACTIVE

## 2021-04-20 LAB — TROPONIN I (HIGH SENSITIVITY): Troponin I (High Sensitivity): 51 ng/L — ABNORMAL HIGH (ref <18)

## 2021-04-20 MED ORDER — GABAPENTIN 300 MG PO CAPS
300.0000 mg | ORAL_CAPSULE | Freq: Every day | ORAL | Status: DC
  Administered 2021-04-20 – 2021-04-21 (×3): 300 mg via ORAL
  Filled 2021-04-20 (×4): qty 1

## 2021-04-20 MED ORDER — INSULIN ASPART 100 UNIT/ML IJ SOLN: 0.0000 [IU] | Freq: Every day | INTRAMUSCULAR | Status: DC

## 2021-04-20 MED ORDER — INSULIN ASPART 100 UNIT/ML IJ SOLN
0.0000 [IU] | Freq: Three times a day (TID) | INTRAMUSCULAR | Status: DC
  Administered 2021-04-20: 1 [IU] via SUBCUTANEOUS
  Administered 2021-04-20: 2 [IU] via SUBCUTANEOUS
  Administered 2021-04-21 (×2): 1 [IU] via SUBCUTANEOUS
  Administered 2021-04-21: 2 [IU] via SUBCUTANEOUS

## 2021-04-20 MED ORDER — BUDESONIDE 0.25 MG/2ML IN SUSP
0.2500 mg | Freq: Two times a day (BID) | RESPIRATORY_TRACT | Status: DC
Start: 2021-04-20 — End: 2021-04-22
  Administered 2021-04-20 – 2021-04-22 (×6): 0.25 mg via RESPIRATORY_TRACT
  Filled 2021-04-20 (×6): qty 2

## 2021-04-20 MED ORDER — PANTOPRAZOLE SODIUM 40 MG PO TBEC
40.0000 mg | DELAYED_RELEASE_TABLET | Freq: Every day | ORAL | Status: DC
  Administered 2021-04-20 – 2021-04-22 (×3): 40 mg via ORAL
  Filled 2021-04-20 (×3): qty 1

## 2021-04-20 MED ORDER — LORATADINE 10 MG PO TABS
10.0000 mg | ORAL_TABLET | Freq: Every day | ORAL | Status: DC | PRN
Start: 2021-04-20 — End: 2021-04-22
  Administered 2021-04-21: 10 mg via ORAL
  Filled 2021-04-20: qty 1

## 2021-04-20 MED ORDER — ACETAMINOPHEN 650 MG RE SUPP: 650.0000 mg | Freq: Four times a day (QID) | RECTAL | Status: DC | PRN

## 2021-04-20 MED ORDER — APIXABAN 5 MG PO TABS
5.0000 mg | ORAL_TABLET | Freq: Two times a day (BID) | ORAL | Status: DC
Start: 2021-04-20 — End: 2021-04-22
  Administered 2021-04-20 – 2021-04-22 (×6): 5 mg via ORAL
  Filled 2021-04-20 (×6): qty 1

## 2021-04-20 MED ORDER — PREDNISONE 20 MG PO TABS
40.0000 mg | ORAL_TABLET | Freq: Every day | ORAL | Status: DC
  Administered 2021-04-20 – 2021-04-21 (×2): 40 mg via ORAL
  Filled 2021-04-20 (×2): qty 2

## 2021-04-20 MED ORDER — MIDODRINE HCL 5 MG PO TABS
5.0000 mg | ORAL_TABLET | Freq: Two times a day (BID) | ORAL | Status: DC
  Administered 2021-04-20 – 2021-04-22 (×5): 5 mg via ORAL
  Filled 2021-04-20 (×5): qty 1

## 2021-04-20 MED ORDER — ATORVASTATIN CALCIUM 40 MG PO TABS
40.0000 mg | ORAL_TABLET | Freq: Every day | ORAL | Status: DC
  Administered 2021-04-20 – 2021-04-22 (×3): 40 mg via ORAL
  Filled 2021-04-20 (×3): qty 1

## 2021-04-20 MED ORDER — IPRATROPIUM-ALBUTEROL 0.5-2.5 (3) MG/3ML IN SOLN
3.0000 mL | Freq: Three times a day (TID) | RESPIRATORY_TRACT | Status: DC
  Administered 2021-04-20 – 2021-04-21 (×5): 3 mL via RESPIRATORY_TRACT
  Filled 2021-04-20 (×5): qty 3

## 2021-04-20 MED ORDER — GABAPENTIN 100 MG PO CAPS
100.0000 mg | ORAL_CAPSULE | ORAL | Status: DC
  Administered 2021-04-20 – 2021-04-22 (×5): 100 mg via ORAL
  Filled 2021-04-20 (×5): qty 1

## 2021-04-20 MED ORDER — ACETAMINOPHEN 325 MG PO TABS
650.0000 mg | ORAL_TABLET | Freq: Four times a day (QID) | ORAL | Status: DC | PRN
  Administered 2021-04-20 (×2): 650 mg via ORAL
  Filled 2021-04-20 (×2): qty 2

## 2021-04-20 MED ORDER — LEVOTHYROXINE SODIUM 100 MCG PO TABS
100.0000 ug | ORAL_TABLET | Freq: Every day | ORAL | Status: DC
  Administered 2021-04-20 – 2021-04-22 (×3): 100 ug via ORAL
  Filled 2021-04-20 (×3): qty 1

## 2021-04-20 MED ORDER — DOXYCYCLINE HYCLATE 100 MG PO TABS
100.0000 mg | ORAL_TABLET | Freq: Two times a day (BID) | ORAL | Status: DC
  Administered 2021-04-20 – 2021-04-22 (×6): 100 mg via ORAL
  Filled 2021-04-20 (×6): qty 1

## 2021-04-20 MED ORDER — ONDANSETRON HCL 4 MG/2ML IJ SOLN
4.0000 mg | Freq: Four times a day (QID) | INTRAMUSCULAR | Status: DC | PRN
  Administered 2021-04-20: 4 mg via INTRAVENOUS
  Filled 2021-04-20: qty 2

## 2021-04-20 MED ORDER — TRAMADOL HCL 50 MG PO TABS
50.0000 mg | ORAL_TABLET | Freq: Two times a day (BID) | ORAL | Status: DC | PRN
  Administered 2021-04-20 – 2021-04-21 (×3): 50 mg via ORAL
  Filled 2021-04-20 (×3): qty 1

## 2021-04-20 MED ORDER — FUROSEMIDE 10 MG/ML IJ SOLN
20.0000 mg | Freq: Two times a day (BID) | INTRAMUSCULAR | Status: DC
  Administered 2021-04-20 – 2021-04-21 (×3): 20 mg via INTRAVENOUS
  Filled 2021-04-20 (×3): qty 2

## 2021-04-20 MED ORDER — PHENOL 1.4 % MT LIQD
1.0000 | OROMUCOSAL | Status: DC | PRN
  Administered 2021-04-21: 1 via OROMUCOSAL
  Filled 2021-04-20: qty 177

## 2021-04-20 MED ORDER — ALBUTEROL SULFATE (2.5 MG/3ML) 0.083% IN NEBU
2.5000 mg | INHALATION_SOLUTION | RESPIRATORY_TRACT | Status: DC | PRN
Start: 2021-04-20 — End: 2021-04-22

## 2021-04-20 NOTE — Assessment & Plan Note (Deleted)
Currently normotensive. -Continue Lasix

## 2021-04-20 NOTE — Assessment & Plan Note (Signed)
Continue home gabapentin ?

## 2021-04-20 NOTE — Evaluation (Signed)
Physical Therapy Evaluation Patient Details Name: [name] MRN: [id number] DOB: 1934-04-08 Today's Date: 04/20/2021  History of Present Illness  Pt is an 87 y/o F presenting to ED on 2/19 for SOB and tailbone pain from mechanical fall. PMH includes DVT, hypothyroidism, recurrent falls, and home oxygen use.    Clinical Impression  [name] is 87 y.o. female admitted with above HPI and diagnosis. Patient is currently limited by functional impairments below (see PT problem list). Patient lives with her son and is independent at baseline. Patient required min assist for mobility today and demonstrates impaired balance with tendency for posterior LOB. Patient will benefit from continued skilled PT interventions to address impairments and progress independence with mobility, recommending return home with constant supervision initially progressing to intermittent and HHPT. Acute PT will follow and progress as able.        Recommendations for follow up therapy are one component of a multi-disciplinary discharge planning process, led by the attending physician.  Recommendations may be updated based on patient status, additional functional criteria and insurance authorization.  Follow Up Recommendations Home health PT    Assistance Recommended at Discharge Frequent or constant Supervision/Assistance  Patient can return home with the following  A little help with walking and/or transfers;A little help with bathing/dressing/bathroom;Assistance with cooking/housework;Assist for transportation;Help with stairs or ramp for entrance    Equipment Recommendations None recommended by PT  Recommendations for Other Services       Functional Status Assessment Patient has had a recent decline in their functional status and demonstrates the ability to make significant improvements in function in a reasonable and predictable amount of time.     Precautions / Restrictions  Precautions Precautions: Fall Restrictions Weight Bearing Restrictions: No      Mobility  Bed Mobility Overal bed mobility: Needs Assistance Bed Mobility: Supine to Sit     Supine to sit: Min assist, HOB elevated     General bed mobility comments: cues to use bed rail and pt taking extra time to initiate transfer for EOB. assist needed to fully scoot to edge.    Transfers Overall transfer level: Needs assistance Equipment used: Rolling walker (2 wheels) Transfers: Sit to/from Stand Sit to Stand: Min assist           General transfer comment: min assist for power up from EOB, cues to use bil UE's on edge to improve pt's ability to initiate. min assist to steady with rise, pt has posterior lean on standing.    Ambulation/Gait Ambulation/Gait assistance: Min assist Gait Distance (Feet): 120 Feet Assistive device: Rolling walker (2 wheels) Gait Pattern/deviations: Step-through pattern, Decreased stride length Gait velocity: decr     General Gait Details: pt required cues to maintain safe position to RW intermittently and assist to reposition. pt with 2 episodes of posterior LOB requiring min assist to steady.  Stairs            Wheelchair Mobility    Modified Rankin (Stroke Patients Only)       Balance Overall balance assessment: Needs assistance Sitting-balance support: Feet supported Sitting balance-Leahy Scale: Good   Postural control: Posterior lean Standing balance support: During functional activity, Reliant on assistive device for balance, Bilateral upper extremity supported Standing balance-Leahy Scale: Poor                               Pertinent Vitals/Pain      Home Living Family/patient  expects to be discharged to:: Private residence Living Arrangements: Children (son) Available Help at Discharge: Family;Friend(s);Available PRN/intermittently Type of Home: House Home Access: Stairs to enter Entrance Stairs-Rails:  None Entrance Stairs-Number of Steps: 2-3   Home Layout: One level Home Equipment: Rollator (4 wheels) Additional Comments: pt on 4L O2 at home    Prior Function Prior Level of Function : Independent/Modified Independent             Mobility Comments: rollator fo mobility ADLs Comments: wears home O2, does not do IADLs     Hand Dominance   Dominant Hand: Right    Extremity/Trunk Assessment   Upper Extremity Assessment Upper Extremity Assessment: Generalized weakness    Lower Extremity Assessment Lower Extremity Assessment: Defer to PT evaluation    Cervical / Trunk Assessment Cervical / Trunk Assessment: Normal  Communication   Communication: No difficulties  Cognition   Behavior During Therapy: WFL for tasks assessed/performed Overall Cognitive Status: No family/caregiver present to determine baseline cognitive functioning                                 General Comments: pt is A&Ox4, pleasant and willing to mobilize, wants to go home        General Comments General comments (skin integrity, edema, etc.): pt on 6L O2 during session, VSS    Exercises     Assessment/Plan    PT Assessment Patient needs continued PT services  PT Problem List Decreased strength;Decreased activity tolerance;Decreased mobility;Decreased balance;Decreased knowledge of use of DME;Decreased safety awareness;Cardiopulmonary status limiting activity       PT Treatment Interventions DME instruction;Gait training;Stair training;Functional mobility training;Therapeutic activities;Therapeutic exercise;Balance training;Neuromuscular re-education;Patient/family education    PT Goals (Current goals can be found in the Care Plan section)  Acute Rehab PT Goals Patient Stated Goal: go home rather than rehab PT Goal Formulation: With patient Time For Goal Achievement: 05/04/21 Potential to Achieve Goals: Good    Frequency Min 3X/week     Co-evaluation                AM-PAC PT "6 Clicks" Mobility  Outcome Measure Help needed turning from your back to your side while in a flat bed without using bedrails?: A Little Help needed moving from lying on your back to sitting on the side of a flat bed without using bedrails?: A Little Help needed moving to and from a bed to a chair (including a wheelchair)?: A Little Help needed standing up from a chair using your arms (e.g., wheelchair or bedside chair)?: A Little Help needed to walk in hospital room?: A Little Help needed climbing 3-5 steps with a railing? : A Lot 6 Click Score: 17    End of Session Equipment Utilized During Treatment: Gait belt;Oxygen Activity Tolerance: Patient tolerated treatment well Patient left: in chair;with call bell/phone within reach;with chair alarm set Nurse Communication: Mobility status PT Visit Diagnosis: Unsteadiness on feet (R26.81);Other abnormalities of gait and mobility (R26.89);Difficulty in walking, not elsewhere classified (R26.2)    Time: 1610-9604 PT Time Calculation (min) (ACUTE ONLY): 38 min   Charges:   PT Evaluation $PT Eval Low Complexity: 1 Low PT Treatments $Gait Training: 8-22 mins $Therapeutic Activity: 8-22 mins        Verner Mould, DPT Acute Rehabilitation Services Office [phone] Pager [phone]   Jacques Navy 04/20/2021, 1:05 PM

## 2021-04-20 NOTE — Assessment & Plan Note (Signed)
HCTZ was stopped last month due to low blood pressures.  She is on midodrine at home.  Currently normotensive. -Continue midodrine.  Receiving Lasix for diuresis, monitor blood pressure closely.

## 2021-04-20 NOTE — H&P (Signed)
History and Physical    [name] TKW:[id number] DOB: 19-Feb-1935 DOA: 04/19/2021  DOS: the patient was seen and examined on 04/19/2021  PCP: Reynold Bowen, MD   Patient coming from: Home  I have personally briefly reviewed patient's old medical records in [facility]  Patient is a 87 year old female with a past medical history of COPD, chronic diastolic CHF, chronic hypoxic respiratory failure on 4 L home oxygen, DVT/PE, hypertension, hyperlipidemia, type II diabetes, hypothyroidism, anxiety and depression, history of breast cancer.  Recently admitted 03/12/2021 for acute on chronic hypoxic respiratory failure after a mechanical fall.  Work-up revealed left upper lobe PE, 3 left-sided rib fractures, and T3 superior endplate compression fracture.  She was started on Eliquis.  Also had AKI, hyponatremia, hypokalemia, and soft blood pressure.  Hydrochlorothiazide was discontinued.  Creatinine down trended to 0.9 on the day of discharge.  TSH was elevated and dose of home Synthroid was increased from 88 to 100 mcg daily.  She was discharged to SNF on 03/18/2021.  She presents to the ED today via EMS from home complaining of shortness of breath and and tailbone pain in the setting of fall earlier today.  EMS reported oxygen saturation of 72% on home oxygen.  Placed on nonrebreather with improvement of sats to 100%.  In the ED, wheezing on exam and was given DuoNeb treatment and Solu-Medrol 125 mg.  Not febrile.  Labs showing no leukocytosis or anemia.  Platelet count 455K, slightly elevated on previous labs as well.  Creatinine 0.7.  COVID and influenza PCR pending.  BNP 545.  High-sensitivity troponin 32 > 49. EKG without acute ischemic changes.  Chest x-ray showing diffuse interstitial prominence in the lungs bilaterally, suggesting interstitial lung disease, slightly improved from the prior exam.  Also showing small right pleural effusion and stable left rib fractures.  X-ray of sacrum/coccyx  showing compression deformity at L5 which is new from 2014 and indeterminate in age.  Also showing compression deformity at L4 with progression since 2014.  CT head negative for acute finding.  CT C-spine negative for acute finding.  CT thoracic spine showing compression deformity in the superior endplate of T3 which is unchanged from the prior exam.  CT lumbar spine negative for acute finding; showing chronic findings including chronic compression deformities.  Patient was given fentanyl for pain.  Patient states she uses a walker to ambulate.  States this morning as she was putting an item on a desk, she let go of her walker and fell backwards.  Landed on her lower back and also injured her head.  After the fall, she started having a headache and pain in her entire back and tailbone.  Denies loss of consciousness.  Denies preceding lightheadedness/dizziness or chest pain.  She is compliant with Eliquis.  Reports chronic cough and shortness of breath due to COPD and does not think that her symptoms are any worse from baseline.  She uses 4 L oxygen all the time.  Son checked her pulse ox today and was down to the 60s on home oxygen.  He called her PCP who advised them to come to the emergency room.  Patient denies fevers, vomiting, abdominal pain, or dysuria.  Reports chronic diarrhea for the past 10 years due to IBS.  Son states her legs are very swollen and she has scabs on her left lower leg which she has been picking.   Review of Systems:  Review of Systems  All other systems reviewed and are  negative.  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   Osteoporosis    PONV (postoperative nausea and vomiting)    Vertebral compression fracture ([facility]) 06/2012   L4    Past Surgical History:  Procedure Laterality Date   APPENDECTOMY     BILATERAL CARPAL TUNNEL RELEASE     BILATERAL LAMINECTOMY/ FORAMINOTOMY  01-14-2004   L4 - L5   BREAST LUMPECTOMY  1996    LEFT BREAST DCIS    BREAST LUMPECTOMY Right 2012   radiation and chemo   BRONCHOSCOPY  01-29-2010   W/ BX   CATARACT EXTRACTION W/ INTRAOCULAR LENS  IMPLANT, BILATERAL     CHOLECYSTECTOMY     COLONOSCOPY     COLONOSCOPY WITH PROPOFOL Left 04/16/2014   Procedure: COLONOSCOPY WITH PROPOFOL;  Surgeon: Winfield Cunas., MD;  Location: [facility] ENDOSCOPY;  Service: Endoscopy;  Laterality: Left;   ESOPHAGOGASTRODUODENOSCOPY N/A 03/11/2013   Procedure: ESOPHAGOGASTRODUODENOSCOPY (EGD);  Surgeon: Lear Ng, MD;  Location: [facility] ENDOSCOPY;  Service: Endoscopy;  Laterality: N/A;   ESOPHAGOGASTRODUODENOSCOPY (EGD) WITH PROPOFOL Left 04/16/2014   Procedure: ESOPHAGOGASTRODUODENOSCOPY (EGD) WITH PROPOFOL;  Surgeon: Winfield Cunas., MD;  Location: [facility] ENDOSCOPY;  Service: Endoscopy;  Laterality: Left;   FEMUR FRACTURE SURGERY  12-18-2010  DR [facility]   INTRAMEDULLARY NAILING LEFT INTERTROCHANTERIC -SUBTROCHANTERIC FX   HARDWARE REMOVAL  10/11/2011   Procedure: HARDWARE REMOVAL;  Surgeon: Johnn Hai, MD;  Location: [place];  Service: Orthopedics;  Laterality: Left;  LEFT THIGH REMOVAL OF DISTAL LOCKING SCREW NEEDS: FLORO, RADIO LUSCENT TABLE AND SCREWDRIVER FOR BIOMET ROD    PLACEMENT PORT- A- CATH  02-18-2010   RIGHT BREAST LUMPECTOMY / REMOVAL LYMPH NODES X2/ REMOVAL PAC  10-01-2010   CARCINOMA RIGHT BREAST   TONSILLECTOMY     TOTAL ABDOMINAL HYSTERECTOMY W/ BILATERAL  SALPINGOOPHORECTOMY  1977  (APPROX)   TRANSTHORACIC ECHOCARDIOGRAM  02-25-2010   LVSF NORMAL/ EF 02-63%/ GRADE I DIASTOLIC DYSFUNCTION/ MILDLY DILATED LEFT ATRIUM   TVT VAGINAL TAPE  SUBURETHRAL SLING   06-08-2005   SUI     reports that she quit smoking about 43 years ago. Her smoking use included cigarettes. She has a 30.00 pack-year smoking history. She has never used smokeless tobacco. She reports current alcohol use of about 7.0 - 10.0 standard drinks per week. She reports that she does not use drugs.  Allergies  Allergen Reactions   Penicillins Anaphylaxis   Adhesive [Tape] Other (See Comments)    Blisters    Morphine     Other reaction(s): Other (See Comments) HALLUCINATIONS   Doxycycline Nausea And Vomiting   Morphine And Related Other (See Comments)    HALLUCINATIONS    Family History  Problem Relation Age of Onset   Emphysema Father    CAD Father    Ovarian cancer Mother  Cancer Mother        cervical   Alzheimer's disease Mother    Hyperlipidemia Mother    Breast cancer Mother    Cancer Sister        cervical   Breast cancer Sister    Breast cancer Maternal Grandmother    Breast cancer Maternal Aunt     Prior to Admission medications   Medication Sig Start Date End Date Taking? Authorizing Provider  acetaminophen (TYLENOL) 500 MG tablet Take 2 tablets (1,000 mg total) by mouth every 6 (six) hours as needed for mild pain. Patient taking differently: Take 1,000 mg by mouth in the morning and at bedtime. 03/17/21  Yes Hongalgi, Lenis Dickinson, MD  albuterol (ACCUNEB) 1.25 MG/3ML nebulizer solution Take 1 ampule by nebulization every 6 (six) hours as needed for wheezing.   Yes [provider]  albuterol (PROVENTIL HFA;VENTOLIN HFA) 108 (90 Base) MCG/ACT inhaler Inhale 2 puffs into the lungs every 6 (six) hours as needed for wheezing or shortness of breath. 03/25/17  Yes Byrum, Rose Fillers, MD  apixaban (ELIQUIS) 5 MG TABS tablet Take 1 tablet (5 mg total) by mouth 2  (two) times daily. 03/22/21  Yes Hongalgi, Lenis Dickinson, MD  feeding supplement (BOOST HIGH PROTEIN) LIQD Take 1 Container by mouth daily. Wild berry   Yes [provider]  Fluticasone-Salmeterol (ADVAIR) 250-50 MCG/DOSE AEPB Inhale 1 puff into the lungs 2 (two) times daily as needed (shortness of breath).   Yes [provider]  gabapentin (NEURONTIN) 100 MG capsule Take 100 mg by mouth 2 (two) times daily. 8 am and 2 pm   Yes [provider]  gabapentin (NEURONTIN) 300 MG capsule Take 300 mg by mouth at bedtime. 03/20/21  Yes [provider]  HYDROcodone bit-homatropine (HYCODAN) 5-1.5 MG/5ML syrup Take 5 mLs by mouth every 6 (six) hours as needed for cough. 03/17/21  Yes Hongalgi, Lenis Dickinson, MD  levothyroxine (SYNTHROID) 100 MCG tablet Take 1 tablet (100 mcg total) by mouth daily before breakfast. For hypothyroid 03/17/21  Yes Hongalgi, Lenis Dickinson, MD  LORazepam (ATIVAN) 0.5 MG tablet Take 1 tablet (0.5 mg total) by mouth every 8 (eight) hours as needed for anxiety. Anxiety 03/17/21  Yes Hongalgi, Lenis Dickinson, MD  midodrine (PROAMATINE) 5 MG tablet Take 5 mg by mouth 2 (two) times daily. 04/07/21  Yes [provider]  oxyCODONE (OXY IR/ROXICODONE) 5 MG immediate release tablet Take 0.5 tablets (2.5 mg total) by mouth every 8 (eight) hours as needed for moderate pain or severe pain. 03/17/21  Yes Hongalgi, Lenis Dickinson, MD  OXYGEN Inhale 4 L into the lungs continuous.   Yes [provider]  pantoprazole (PROTONIX) 40 MG tablet Take 1 tablet (40 mg total) by mouth daily. 03/17/21  Yes Hongalgi, Lenis Dickinson, MD  simvastatin (ZOCOR) 80 MG tablet Take 80 mg by mouth daily. 03/26/18  Yes [provider]  Donnal Debar 200-62.5-25 MCG/ACT AEPB Take 1 puff by mouth daily. 04/07/21  Yes [provider]  apixaban (ELIQUIS) 5 MG TABS tablet Take 2 tablets (10 mg total) by mouth 2 (two) times daily for 4 days. Completes 03/21/2021 10 PM dose.  From 03/22/2021, start reduced  dosing as per different prescription Patient not taking: Reported on 04/19/2021 03/17/21 03/21/21  Modena Jansky, MD  fluticasone [facility]) 50 MCG/ACT nasal spray Place 2 sprays into both nostrils daily. Patient not taking: Reported on 04/19/2021 02/11/17   Collene Gobble, MD  levothyroxine (SYNTHROID) 88 MCG tablet Take  88 mcg by mouth daily. 04/18/21   [provider]  lidocaine (LIDODERM) 5 % Place 1 patch onto the skin daily. Remove & Discard patch within 12 hours or as directed by MD. Apply to left upper lateral chest wall at site of maximum pain. Patient not taking: Reported on 04/19/2021 03/17/21   Modena Jansky, MD  Nutritional Supplements (,FEEDING SUPPLEMENT, PROSOURCE PLUS) liquid Take 30 mLs by mouth 3 (three) times daily between meals. Patient not taking: Reported on 04/19/2021 03/17/21   Modena Jansky, MD    Physical Exam: Vitals:   04/19/21 2300 04/19/21 2315 04/19/21 2330 04/20/21 0100  BP: 129/71 126/73 124/72 121/71  Pulse: 94 89 89 91  Resp: (!) 22 18 19  (!) 25  Temp:      TempSrc:      SpO2: 97% 100% 97% 96%  Weight:      Height:        Physical Exam Constitutional:      General: She is not in acute distress. HENT:     Head: Normocephalic and atraumatic.  Eyes:     Extraocular Movements: Extraocular movements intact.     Conjunctiva/sclera: Conjunctivae normal.  Neck:     Comments: JVD present Cardiovascular:     Rate and Rhythm: Normal rate and regular rhythm.     Pulses: Normal pulses.  Pulmonary:     Effort: Pulmonary effort is normal. No respiratory distress.     Breath sounds: Rales present. No wheezing.  Abdominal:     General: Bowel sounds are normal. There is no distension.     Palpations: Abdomen is soft.     Tenderness: There is no abdominal tenderness.  Musculoskeletal:     Cervical back: Normal range of motion and neck supple.     Right lower leg: Edema present.     Left lower leg: Edema present.     Comments: +4 pitting  edema of bilateral lower extremities (L >R)  Skin:    General: Skin is warm and dry.     Comments: Erythema and warmth noted on the left lower leg  Neurological:     General: No focal deficit present.     Mental Status: She is alert and oriented to person, place, and time.       Labs on Admission: I have personally reviewed following labs and imaging studies  CBC: Recent Labs  Lab 04/19/21 2101  WBC 9.9  NEUTROABS 8.1*  HGB 13.4  HCT 44.2  MCV 88.6  PLT 009*   Basic Metabolic Panel: Recent Labs  Lab 04/19/21 2101  NA 136  K 3.8  CL 100  CO2 25  GLUCOSE 113*  BUN 21  CREATININE 0.78  CALCIUM 8.2*   GFR: Estimated Creatinine Clearance: 42.5 mL/min (by C-G formula based on SCr of 0.78 mg/dL). Liver Function Tests: Recent Labs  Lab 04/19/21 2101  AST 34  ALT 15  ALKPHOS 84  BILITOT 1.2  PROT 7.0  ALBUMIN 2.9*   No results for input(s): LIPASE, AMYLASE in the last 168 hours. No results for input(s): AMMONIA in the last 168 hours. Coagulation Profile: No results for input(s): INR, PROTIME in the last 168 hours. Cardiac Enzymes: No results for input(s): CKTOTAL, CKMB, CKMBINDEX, TROPONINI in the last 168 hours. BNP (last 3 results) No results for input(s): PROBNP in the last 8760 hours. HbA1C: No results for input(s): HGBA1C in the last 72 hours. CBG: No results for input(s): GLUCAP in the last 168 hours. Lipid  Profile: No results for input(s): CHOL, HDL, LDLCALC, TRIG, CHOLHDL, LDLDIRECT in the last 72 hours. Thyroid Function Tests: No results for input(s): TSH, T4TOTAL, FREET4, T3FREE, THYROIDAB in the last 72 hours. Anemia Panel: No results for input(s): VITAMINB12, FOLATE, FERRITIN, TIBC, IRON, RETICCTPCT in the last 72 hours. Urine analysis:    Component Value Date/Time   COLORURINE ORANGE (A) 03/12/2021 2057   APPEARANCEUR CLEAR 03/12/2021 2057   LABSPEC 1.029 03/12/2021 2057   PHURINE 5.5 03/12/2021 2057   GLUCOSEU NEGATIVE 03/12/2021 2057    HGBUR TRACE (A) 03/12/2021 2057   BILIRUBINUR SMALL (A) 03/12/2021 2057   KETONESUR 40 (A) 03/12/2021 2057   PROTEINUR 30 (A) 03/12/2021 2057   UROBILINOGEN 0.2 04/10/2014 2045   NITRITE NEGATIVE 03/12/2021 2057   LEUKOCYTESUR NEGATIVE 03/12/2021 2057    Radiological Exams on Admission: I have personally reviewed images DG Chest 2 View  Result Date: 04/19/2021 CLINICAL DATA:  Shortness of breath, fall at home on hard floor. EXAM: CHEST - 2 VIEW COMPARISON:  03/14/2021. FINDINGS: The heart size and mediastinal contours are stable. There is atherosclerotic calcification of the aorta. Prominent interstitial markings are present in the lungs bilaterally, slightly improved from the prior exam. There is a small right pleural effusion. No pneumothorax is seen. Fracture deformities in the ribs on the left are unchanged. Surgical changes are present over the left chest. IMPRESSION: 1. Diffuse interstitial prominence in the lungs bilaterally, suggesting interstitial lung disease, slightly improved from the prior exam. 2. Small right pleural effusion. 3. Stable left rib fractures. Electronically Signed   By: Brett Fairy M.D.   On: 04/19/2021 21:33   DG Sacrum/Coccyx  Result Date: 04/19/2021 CLINICAL DATA:  Fall at home on hard floor. EXAM: SACRUM AND COCCYX - 2+ VIEW COMPARISON:  12/18/2010, 10/30/2012. FINDINGS: There is no evidence of acute sacral fracture. There is a compression deformity at L5 which is indeterminate in age. A compression deformity is noted at L4 with progression from 2014. Fixation hardware is present in the proximal left femur. Degenerative changes are noted at the sacroiliac joints bilaterally. Vascular calcifications are noted in the pelvis. IMPRESSION: 1. Compression deformity at L5 which is new from 2014 and indeterminate in age. 2. Compression deformity at L4 with progression since 2014. 3. No definite evidence of sacral fracture. Electronically Signed   By: Brett Fairy M.D.   On:  04/19/2021 21:39   CT HEAD WO CONTRAST (5MM)  Result Date: 04/19/2021 CLINICAL DATA:  Fall, head and neck trauma. EXAM: CT HEAD WITHOUT CONTRAST CT CERVICAL SPINE WITHOUT CONTRAST TECHNIQUE: Multidetector CT imaging of the head and cervical spine was performed following the standard protocol without intravenous contrast. Multiplanar CT image reconstructions of the cervical spine were also generated. RADIATION DOSE REDUCTION: This exam was performed according to the departmental dose-optimization program which includes automated exposure control, adjustment of the mA and/or kV according to patient size and/or use of iterative reconstruction technique. COMPARISON:  03/12/2021. FINDINGS: CT HEAD FINDINGS Brain: No acute intracranial hemorrhage, midline shift or mass effect. No extra-axial fluid collection. Diffuse atrophy is noted. Subcortical and periventricular white matter hypodensities are present bilaterally. No hydrocephalus. Vascular: No hyperdense vessel or unexpected calcification. Skull: Normal. Negative for fracture or focal lesion. Sinuses/Orbits: Mucosal thickening is present in the maxillary sinuses and frontal sinus on the right. There is debris in the left sphenoid sinus. No acute orbital abnormality. Other: None. CT CERVICAL SPINE FINDINGS Alignment: There is mild anterolisthesis at C7-T1. Skull base and vertebrae: There is  a compression deformity in the superior endplate at B01 which was likely present on the prior exam. No acute cervical spine fracture is noted. Soft tissues and spinal canal: No prevertebral fluid or swelling. No visible canal hematoma. Disc levels: Multilevel intervertebral disc space narrowing, uncovertebral osteophyte formation and facet arthropathy is noted. Upper chest: Interstitial prominence in cystic spaces are present at the lung apices bilaterally. There is atherosclerotic calcification of the aorta. Other: None. IMPRESSION: 1. No acute intracranial process. 2. Atrophy  with chronic microvascular ischemic changes. 3. No acute fracture in the cervical spine. 4. Compression deformity in the superior endplate of T3 which is unchanged from the prior exam. 5. Findings suggestive interstitial lung disease and/or emphysema at the lung apices. Electronically Signed   By: Brett Fairy M.D.   On: 04/19/2021 23:03   CT Cervical Spine Wo Contrast  Result Date: 04/19/2021 CLINICAL DATA:  Fall, head and neck trauma. EXAM: CT HEAD WITHOUT CONTRAST CT CERVICAL SPINE WITHOUT CONTRAST TECHNIQUE: Multidetector CT imaging of the head and cervical spine was performed following the standard protocol without intravenous contrast. Multiplanar CT image reconstructions of the cervical spine were also generated. RADIATION DOSE REDUCTION: This exam was performed according to the departmental dose-optimization program which includes automated exposure control, adjustment of the mA and/or kV according to patient size and/or use of iterative reconstruction technique. COMPARISON:  03/12/2021. FINDINGS: CT HEAD FINDINGS Brain: No acute intracranial hemorrhage, midline shift or mass effect. No extra-axial fluid collection. Diffuse atrophy is noted. Subcortical and periventricular white matter hypodensities are present bilaterally. No hydrocephalus. Vascular: No hyperdense vessel or unexpected calcification. Skull: Normal. Negative for fracture or focal lesion. Sinuses/Orbits: Mucosal thickening is present in the maxillary sinuses and frontal sinus on the right. There is debris in the left sphenoid sinus. No acute orbital abnormality. Other: None. CT CERVICAL SPINE FINDINGS Alignment: There is mild anterolisthesis at C7-T1. Skull base and vertebrae: There is a compression deformity in the superior endplate at B51 which was likely present on the prior exam. No acute cervical spine fracture is noted. Soft tissues and spinal canal: No prevertebral fluid or swelling. No visible canal hematoma. Disc levels:  Multilevel intervertebral disc space narrowing, uncovertebral osteophyte formation and facet arthropathy is noted. Upper chest: Interstitial prominence in cystic spaces are present at the lung apices bilaterally. There is atherosclerotic calcification of the aorta. Other: None. IMPRESSION: 1. No acute intracranial process. 2. Atrophy with chronic microvascular ischemic changes. 3. No acute fracture in the cervical spine. 4. Compression deformity in the superior endplate of T3 which is unchanged from the prior exam. 5. Findings suggestive interstitial lung disease and/or emphysema at the lung apices. Electronically Signed   By: Brett Fairy M.D.   On: 04/19/2021 23:03   CT Thoracic Spine Wo Contrast  Result Date: 04/20/2021 CLINICAL DATA:  Initial evaluation for acute trauma, fall. EXAM: CT THORACIC SPINE WITHOUT CONTRAST TECHNIQUE: Multidetector CT images of the thoracic were obtained using the standard protocol without intravenous contrast. RADIATION DOSE REDUCTION: This exam was performed according to the departmental dose-optimization program which includes automated exposure control, adjustment of the mA and/or kV according to patient size and/or use of iterative reconstruction technique. COMPARISON:  Comparison made with prior CT from 03/12/2021 and MRI from 05/04/2016. FINDINGS: Alignment: Sigmoid scoliotic curvature of the thoracic spine with exaggeration of the normal thoracic kyphosis. No listhesis. Vertebrae: Compression deformities involving the T3, T7, and T8 vertebral bodies, grossly stable from previous, consistent with chronic fractures. Additional compression fractures involving  the T10, T11, and T12 vertebral bodies also favored to be chronic in nature. No visible acute vertebral body fracture. Subacute fractures involving the left posterolateral fourth, fifth, and sixth ribs noted. Few additional chronic remotely healed rib fractures noted. No discrete or worrisome osseous lesions. Paraspinal  and other soft tissues: Paraspinous soft tissues demonstrate no acute finding. Small layering bilateral pleural effusions. Moderate aortic atherosclerosis. Moderate hiatal hernia. Severe emphysematous changes with associated bronchiectasis noted within the visualized lungs. Surgical clips present within the left axillary region. Disc levels: Mild multilevel disc desiccation seen throughout the mid and lower thoracic spine. Mild scattered facet arthrosis. No significant spinal stenosis. IMPRESSION: 1. No acute traumatic injury within the thoracic spine. 2. Chronic compression deformities involving the T3, T7, T8, and T10 through T12 vertebral bodies. 3. Subacute fractures involving the left posterolateral fourth, fifth, and sixth ribs. 4. Small layering bilateral pleural effusions. 5. Moderate hiatal hernia. 6. Aortic Atherosclerosis (ICD10-I70.0) and Emphysema (ICD10-J43.9). Electronically Signed   By: Jeannine Boga M.D.   On: 04/20/2021 00:04   CT Lumbar Spine Wo Contrast  Result Date: 04/19/2021 CLINICAL DATA:  Initial evaluation for acute trauma, fall. EXAM: CT LUMBAR SPINE WITHOUT CONTRAST TECHNIQUE: Multidetector CT imaging of the lumbar spine was performed without intravenous contrast administration. Multiplanar CT image reconstructions were also generated. RADIATION DOSE REDUCTION: This exam was performed according to the departmental dose-optimization program which includes automated exposure control, adjustment of the mA and/or kV according to patient size and/or use of iterative reconstruction technique. COMPARISON:  Prior MRI from 10/31/2012. FINDINGS: Segmentation: Standard. Lowest well-formed disc space labeled the L5-S1 level. Alignment: 3 mm facet mediated anterolisthesis of L4 on L5. Alignment otherwise normal with preservation of the normal lumbar lordosis. Vertebrae: Chronic compression deformities involving the L2 and L3 vertebral bodies with sequelae of prior vertebral augmentation.  Additional chronic compression deformities involving the L1, L4, and L5 vertebral bodies noted as well. No acute fracture. Visualized sacrum and pelvis intact. No worrisome osseous lesions. Paraspinal and other soft tissues: Paraspinous soft tissues demonstrate no acute finding. Asymmetric atrophy noted involving the left psoas muscle. Advanced aorto bi-iliac atherosclerotic disease. Disc levels: L1-2: Mild disc bulge up to 5 mm bony retropulsion related to the chronic L2 compression fracture. Mild bilateral facet hypertrophy. Resultant mild spinal stenosis. Foramina remain patent. L2-3: Mild diffuse disc bulge. Up to 3 mm bony retropulsion related to the chronic L3 compression fracture. Moderate bilateral facet hypertrophy. Resultant moderate canal with right worse than left lateral recess stenosis. Foramina remain patent. L3-4: Mild disc bulge. Superimposed small right extraforaminal disc protrusion closely approximates the exiting right L3 nerve root (series 5, image 49). Moderate to advanced bilateral facet arthrosis. Resultant moderate canal with bilateral subarticular stenosis. Mild bilateral L3 foraminal narrowing. L4-5: Trace anterolisthesis. Mild disc bulge with disc desiccation. Severe bilateral facet arthrosis. Prior right hemi laminectomy. No significant spinal stenosis. Moderate bilateral L4 foraminal stenosis. L5-S1: Minimal disc bulge with reactive endplate change. Moderate right worse than left facet hypertrophy. No spinal stenosis. Foramina remain patent. IMPRESSION: 1. No acute traumatic injury within the lumbar spine. 2. Chronic compression deformities involving the L1 through L5 vertebral bodies with sequelae of prior vertebral augmentation at L2 and L3. 3. Underlying moderate multilevel degenerative spondylosis and facet arthrosis, with resultant moderate spinal stenosis at L2-3 and L3-4. Moderate bilateral L4 foraminal narrowing. 4. Aortic Atherosclerosis (ICD10-I70.0). Electronically Signed    By: Jeannine Boga M.D.   On: 04/19/2021 23:50    EKG: I have personally  reviewed EKG: Sinus tachycardia, RBBB.  No acute ischemic changes.  Assessment/Plan Principal Problem:   Acute on chronic respiratory failure with hypoxia (HCC) Active Problems:   Cellulitis   Physical deconditioning   Elevated troponin   Dyslipidemia   Low blood pressure   Hypothyroidism   Diabetes mellitus type 2, noninsulin dependent (HCC)   History of pulmonary embolism   Diabetic neuropathy (HCC)    Assessment and Plan: * Acute on chronic respiratory failure with hypoxia ([place])- (present on admission) Acute COPD exacerbation Acute on chronic diastolic CHF Acute on chronic hypoxic respiratory failure likely multifactorial due to acute COPD exacerbation, acute on chronic diastolic CHF, and possibly poor inspiratory effort in the setting of rib fractures a month ago.  SPO2 72% on 4 L home oxygen with EMS, initially required nonrebreather.  She was wheezing on initial exam done in the ED and was given Solu-Medrol 125 mg and DuoNeb treatment.  Currently not wheezing and satting well on 4 L home oxygen.  BNP elevated at 545 and has significant peripheral edema. Echo done 03/16/2021 showing EF 60 to 65%, G1 DD.  Hydrochlorothiazide was stopped during hospitalization last month.  Chest x-ray showing diffuse interstitial prominence in the lungs bilaterally, suggesting interstitial lung disease, slightly improved from the prior exam.  Also showing small right pleural effusion and stable left rib fractures.  Recurrent PE felt to be less likely as she is compliant with Eliquis. -Continue supplemental oxygen to keep oxygen saturation above 88%, continuous pulse ox -Prednisone 40 mg daily, DuoNeb every 8 hours, Pulmicort neb twice daily, albuterol neb prn, and doxycycline. -IV Lasix 20 mg every 12 hours.  Monitor intake and output, daily weights.  Low-sodium diet with fluid restriction. -Incentive spirometry -Check  D-dimer level   Elevated troponin- (present on admission) High-sensitivity troponin 32 > 49.  ACS less likely as EKG without acute ischemic changes and patient denies chest pain.    Troponin was in the 30s on labs done a month ago. -Cardiac monitoring, trend troponin   Physical deconditioning- (present on admission) Mechanical fall Trauma scans negative for acute finding. -PT/OT eval, fall precautions  Cellulitis Son states patient has been picking at scabs on her left lower leg and on exam appears to have signs of cellulitis including erythema and warmth.  No signs of sepsis at this time.  Admitted for PE last month and Dopplers done at that time negative for DVT.  Patient is compliant with Eliquis, low suspicion for acute DVT. -Start doxycycline and check D-dimer level  Diabetic neuropathy ([place]) -Continue home gabapentin  History of pulmonary embolism -Continue Eliquis  Diabetes mellitus type 2, noninsulin dependent (HCC) -Check A1c.  Sliding scale insulin sensitive ACHS.  Hypothyroidism- (present on admission) -Continue Synthroid  Low blood pressure HCTZ was stopped last month due to low blood pressures.  She is on midodrine at home.  Currently normotensive. -Continue midodrine.  Receiving Lasix for diuresis, monitor blood pressure closely.  Dyslipidemia- (present on admission) -Continue statin   DVT prophylaxis: Eliquis Code Status:  DNR (discussed with the patient) Family Communication: Son at bedside. Admission status: Observation, Telemetry bed It is my clinical opinion that referral for OBSERVATION is reasonable and necessary in this patient based on the above information provided. The aforementioned taken together are felt to place the patient at high risk for further clinical deterioration. However, it is anticipated that the patient may be medically stable for discharge from the hospital within 24 to 48 hours.   Shela Leff, MD  [facility] 04/20/2021, 1:07 AM

## 2021-04-20 NOTE — Progress Notes (Signed)
PROGRESS NOTE  Brief Narrative: [name] is an 87 y.o. female with a history of chronic respiratory failure on 4L O2, among others, who was recently admitted after a mechanical fall found to have PE, started on anticoagulation, left rib fractures, and T3 compression fracture. She also had AKI, HCTZ discontinued, and elevated TSH for which synthroid dose with augmented. She was discharged to SNF, subsequently returning home until she had another couple falls with sacral pain and developed worsening shortness of breath with hypoxemia noted on pulse oximetry prompting EMS activation. She was admitted past midnight this morning by Dr. Marlowe Sax for management of acute on chronic hypoxic respiratory failure due to COPD exacerbation with possible contribution of acute on chronic HFpEF.  Subjective: Breathing feels better than on arrival, still very short of breath with the exertion to get bed > chair despite 6L O2 still desaturating intermittently. Wheezing resolved. No chest pain. Right leg is starting to swell which is new.   Objective: BP (!) 102/54 (BP Location: Left Arm)    Pulse 77    Temp 98.4 F (36.9 C) (Oral)    Resp 17    Ht 5\' 3"  (1.6 m)    Wt 59.5 kg    SpO2 92%    BMI 23.24 kg/m   Gen: Elderly, pleasant female in no distress Pulm: Crackles at bases bilaterally, no wheezes.  CV: RRR, no murmur, no JVD. LLE > RLE edema but both fairly swollen GI: Soft, NT, ND, +BS  Neuro: Alert and oriented. No focal deficits. Skin: Tender warm erythema irregularly surrounding dry wounds/eschars on left shin and to a lesser degree right shin.  Assessment & Plan: - Continue treatment for COPD exacerbation. No longer wheezing, already deescalated for prednisone.  - Continue lasix IV BID for today with crackles being most prominent point on exam. Note recent LVEF 60-65%, G1DD, RV pressure overload with estimated RVSP 19mmHg. - Check LE venous dopplers since swelling remains worse and R notably  worsening. Though she is on anticoagulation regardless, this will give diagnostic information. - Urged to use IS, OOB as much as possible, try tramadol for pain as rib fractures and back pain from falls/compression fractures (of which there are many) are causing hypoventilation and worsening baseline hypoxemia due to atelectasis.   Otherwise, please see H&P  Patrecia Pour, MD Pager on amion 04/20/2021, 1:17 PM

## 2021-04-20 NOTE — Assessment & Plan Note (Addendum)
Son states patient has been picking at scabs on her left lower leg and on exam appears to have signs of cellulitis including erythema and warmth.  No signs of sepsis at this time.  Admitted for PE last month and Dopplers done at that time negative for DVT.  Patient is compliant with Eliquis, low suspicion for acute DVT. -Start doxycycline and check D-dimer level

## 2021-04-20 NOTE — Assessment & Plan Note (Signed)
-   Continue Eliquis 

## 2021-04-20 NOTE — Assessment & Plan Note (Deleted)
Trauma scans negative for acute finding. -PT/OT eval, fall precautions

## 2021-04-20 NOTE — Assessment & Plan Note (Addendum)
Continue statin. 

## 2021-04-20 NOTE — Assessment & Plan Note (Signed)
Continue Synthroid °

## 2021-04-20 NOTE — Assessment & Plan Note (Signed)
-  Check A1c.  Sliding scale insulin sensitive ACHS.

## 2021-04-20 NOTE — Plan of Care (Signed)
°  Problem: Education: Goal: Ability to demonstrate management of disease process will improve Outcome: Progressing   Problem: Education: Goal: Knowledge of General Education information will improve Description: Including pain rating scale, medication(s)/side effects and non-pharmacologic comfort measures Outcome: Progressing   Problem: Activity: Goal: Risk for activity intolerance will decrease Outcome: Progressing   Problem: Nutrition: Goal: Adequate nutrition will be maintained Outcome: Progressing   Problem: Coping: Goal: Level of anxiety will decrease Outcome: Progressing   Problem: Elimination: Goal: Will not experience complications related to bowel motility Outcome: Progressing

## 2021-04-20 NOTE — Assessment & Plan Note (Addendum)
Mechanical fall Trauma scans negative for acute finding. -PT/OT eval, fall precautions

## 2021-04-20 NOTE — Assessment & Plan Note (Addendum)
High-sensitivity troponin 32 > 49.  ACS less likely as EKG without acute ischemic changes and patient denies chest pain.    Troponin was in the 30s on labs done a month ago. -Cardiac monitoring, trend troponin

## 2021-04-20 NOTE — Plan of Care (Signed)
  Problem: Education: Goal: Ability to demonstrate management of disease process will improve Outcome: Progressing Goal: Ability to verbalize understanding of medication therapies will improve Outcome: Progressing   

## 2021-04-20 NOTE — Assessment & Plan Note (Deleted)
-  Check A1c.  Sliding scale insulin sensitive ACHS.

## 2021-04-20 NOTE — Assessment & Plan Note (Addendum)
Acute COPD exacerbation Acute on chronic diastolic CHF Acute on chronic hypoxic respiratory failure likely multifactorial due to acute COPD exacerbation, acute on chronic diastolic CHF, and possibly poor inspiratory effort in the setting of rib fractures a month ago.  SPO2 72% on 4 L home oxygen with EMS, initially required nonrebreather.  She was wheezing on initial exam done in the ED and was given Solu-Medrol 125 mg and DuoNeb treatment.  Currently not wheezing and satting well on 4 L home oxygen.  BNP elevated at 545 and has significant peripheral edema. Echo done 03/16/2021 showing EF 60 to 65%, G1 DD.  Hydrochlorothiazide was stopped during hospitalization last month.  Chest x-ray showing diffuse interstitial prominence in the lungs bilaterally, suggesting interstitial lung disease, slightly improved from the prior exam.  Also showing small right pleural effusion and stable left rib fractures.  Recurrent PE felt to be less likely as she is compliant with Eliquis. -Continue supplemental oxygen to keep oxygen saturation above 88%, continuous pulse ox -Prednisone 40 mg daily, DuoNeb every 8 hours, Pulmicort neb twice daily, albuterol neb prn, and doxycycline. -IV Lasix 20 mg every 12 hours.  Monitor intake and output, daily weights.  Low-sodium diet with fluid restriction. -Incentive spirometry -Check D-dimer level

## 2021-04-20 NOTE — Progress Notes (Signed)
Heart Failure Navigator Progress Note  Assessed for Heart & Vascular TOC clinic readiness.  Pt declined HV TOC clinic appt upon DC from hospitalization. Pt educated on benefits of HV TOC. Pt education complete regarding HF patient booklet. Pt eats cheez-its for snack/dinner most days, educated on sodium modifications. Son at bedside concerned with pain management of BLE edema/wounds. Pt now has tramadol for pain control. Abx ordered PO. Informed pt and son. Bedside RN notified regarding pain medication request.   Encouraged to call PCP for follow up appt upon hospital DC.   Pricilla Holm, MSN, RN Heart Failure Nurse Navigator [phone]

## 2021-04-20 NOTE — Evaluation (Signed)
Occupational Therapy Evaluation Patient Details Name: [name] MRN: [id number] DOB: [birth year] Today's Date: 04/20/2021   History of Present Illness Pt is an 87 y/o F presenting to ED on 2/19 for SOB and tailbone pain from mechanical fall. PMH includes DVT, hypothyroidism, recurrent falls, and home oxygen use.   Clinical Impression   Pt independent at baseline with ADLs and reports using RW for mobility, on 4L O2 via [place] at home. Lives with son who is not always home, but has a friend who may be able to stay/check in on pt during the day. Pt currently min-mod A for ADLs, and min A for transfers. Pt has posterior lean when ambulating with RW. Pt able to complete standing sinkside ADL with min A for balance. Pt presenting with impairments listed below, will follow. Recommend HHOT at d/c.     Recommendations for follow up therapy are one component of a multi-disciplinary discharge planning process, led by the attending physician.  Recommendations may be updated based on patient status, additional functional criteria and insurance authorization.   Follow Up Recommendations  Home health OT    Assistance Recommended at Discharge Set up Supervision/Assistance  Patient can return home with the following A little help with walking and/or transfers;A little help with bathing/dressing/bathroom;Assistance with cooking/housework;Assist for transportation;Help with stairs or ramp for entrance    Functional Status Assessment  Patient has had a recent decline in their functional status and demonstrates the ability to make significant improvements in function in a reasonable and predictable amount of time.  Equipment Recommendations  BSC/3in1 (as shower seat)    Recommendations for Other Services       Precautions / Restrictions Precautions Precautions: Fall Restrictions Weight Bearing Restrictions: No      Mobility Bed Mobility               General bed mobility comments: pt up  in chair upon arrival    Transfers Overall transfer level: Needs assistance Equipment used: Rolling walker (2 wheels) Transfers: Sit to/from Stand Sit to Stand: Min assist                  Balance Overall balance assessment: Needs assistance Sitting-balance support: Feet supported Sitting balance-Leahy Scale: Fair     Standing balance support: During functional activity, Reliant on assistive device for balance Standing balance-Leahy Scale: Good Standing balance comment: posterior lean at times                           ADL either performed or assessed with clinical judgement   ADL Overall ADL's : Needs assistance/impaired Eating/Feeding: Set up;Sitting   Grooming: Oral care;Wash/dry face;Wash/dry hands;Standing;Min guard Grooming Details (indicate cue type and reason): completed standing at sink Upper Body Bathing: Minimal assistance;Sitting   Lower Body Bathing: Moderate assistance;Sitting/lateral leans   Upper Body Dressing : Minimal assistance;Sitting   Lower Body Dressing: Moderate assistance;Sitting/lateral leans   Toilet Transfer: Minimal assistance;Rolling walker (2 wheels);Ambulation;Comfort height toilet Toilet Transfer Details (indicate cue type and reason): simulated in room Toileting- Clothing Manipulation and Hygiene: Maximal assistance;Sitting/lateral lean       Functional mobility during ADLs: Min guard;Minimal assistance;Rolling walker (2 wheels)       Vision   Vision Assessment?: No apparent visual deficits     Perception     Praxis      Pertinent Vitals/Pain Pain Assessment Pain Assessment: No/denies pain     Hand Dominance Right   Extremity/Trunk Assessment Upper  Extremity Assessment Upper Extremity Assessment: Generalized weakness   Lower Extremity Assessment Lower Extremity Assessment: Defer to PT evaluation   Cervical / Trunk Assessment Cervical / Trunk Assessment: Normal   Communication  Communication Communication: No difficulties   Cognition Arousal/Alertness: Awake/alert Behavior During Therapy: WFL for tasks assessed/performed                                         General Comments  pt on 6L O2 during session, VSS    Exercises     Shoulder Instructions      Home Living Family/patient expects to be discharged to:: Private residence Living Arrangements: Children (son) Available Help at Discharge: Family;Friend(s);Available PRN/intermittently Type of Home: House Home Access: (P) Stairs to enter Entrance Stairs-Number of Steps: 2-3 Entrance Stairs-Rails: None Home Layout: One level     Bathroom Shower/Tub: Teacher, early years/pre: Handicapped height Bathroom Accessibility: Yes How Accessible: Accessible via walker Home Equipment: Rollator (4 wheels)   Additional Comments: pt on 4L O2 at home      Prior Functioning/Environment Prior Level of Function : Independent/Modified Independent             Mobility Comments: rollator fo mobility ADLs Comments: wears home O2, does not do IADLs        OT Problem List: Decreased strength;Decreased activity tolerance;Decreased range of motion;Impaired balance (sitting and/or standing);Impaired UE functional use      OT Treatment/Interventions: Self-care/ADL training;Therapeutic exercise;Energy conservation;DME and/or AE instruction;Therapeutic activities;Patient/family education;Balance training    OT Goals(Current goals can be found in the care plan section) Acute Rehab OT Goals Patient Stated Goal: none stated OT Goal Formulation: With patient Time For Goal Achievement: 05/04/21 Potential to Achieve Goals: Good ADL Goals Pt Will Perform Upper Body Dressing: with supervision;sitting Pt Will Perform Lower Body Dressing: with min assist;sit to/from stand;sitting/lateral leans Pt Will Transfer to Toilet: with supervision;ambulating;regular height toilet Pt Will Perform  Tub/Shower Transfer: with min assist;Shower transfer;rolling walker;3 in 1  OT Frequency: Min 2X/week    Co-evaluation              AM-PAC OT "6 Clicks" Daily Activity     Outcome Measure Help from another person eating meals?: None Help from another person taking care of personal grooming?: A Little Help from another person toileting, which includes using toliet, bedpan, or urinal?: A Little Help from another person bathing (including washing, rinsing, drying)?: A Lot Help from another person to put on and taking off regular upper body clothing?: A Little Help from another person to put on and taking off regular lower body clothing?: A Lot 6 Click Score: 17   End of Session Equipment Utilized During Treatment: Gait belt;Rolling walker (2 wheels);Oxygen Nurse Communication: Mobility status  Activity Tolerance: Patient tolerated treatment well Patient left: in chair;with call bell/phone within reach  OT Visit Diagnosis: Unsteadiness on feet (R26.81);Other abnormalities of gait and mobility (R26.89);Repeated falls (R29.6);History of falling (Z91.81);Muscle weakness (generalized) (M62.81)                Time: 8250-5397 OT Time Calculation (min): 26 min Charges:  OT General Charges $OT Visit: 1 Visit OT Evaluation $OT Eval Low Complexity: 1 Low OT Treatments $Self Care/Home Management : 8-22 mins  Lynnda Child, OTD, OTR/L Acute Rehab [phone]) [phone] [place] 04/20/2021, 12:02 PM

## 2021-04-21 ENCOUNTER — Inpatient Hospital Stay (HOSPITAL_COMMUNITY): Payer: PPO

## 2021-04-21 DIAGNOSIS — E1142 Type 2 diabetes mellitus with diabetic polyneuropathy: Secondary | ICD-10-CM

## 2021-04-21 DIAGNOSIS — E039 Hypothyroidism, unspecified: Secondary | ICD-10-CM

## 2021-04-21 DIAGNOSIS — R7989 Other specified abnormal findings of blood chemistry: Secondary | ICD-10-CM

## 2021-04-21 DIAGNOSIS — E119 Type 2 diabetes mellitus without complications: Secondary | ICD-10-CM

## 2021-04-21 DIAGNOSIS — Z86711 Personal history of pulmonary embolism: Secondary | ICD-10-CM

## 2021-04-21 LAB — GLUCOSE, CAPILLARY
Glucose-Capillary: 126 mg/dL — ABNORMAL HIGH (ref 70–99)
Glucose-Capillary: 130 mg/dL — ABNORMAL HIGH (ref 70–99)
Glucose-Capillary: 153 mg/dL — ABNORMAL HIGH (ref 70–99)
Glucose-Capillary: 108 mg/dL — ABNORMAL HIGH (ref 70–99)

## 2021-04-21 LAB — CBC
HCT: 36.5 % (ref 36.0–46.0)
Hemoglobin: 11.6 g/dL — ABNORMAL LOW (ref 12.0–15.0)
MCH: 27.2 pg (ref 26.0–34.0)
MCHC: 31.8 g/dL (ref 30.0–36.0)
MCV: 85.7 fL (ref 80.0–100.0)
Platelets: 391 10*3/uL (ref 150–400)
RBC: 4.26 MIL/uL (ref 3.87–5.11)
RDW: 18.6 % — ABNORMAL HIGH (ref 11.5–15.5)
WBC: 13.5 10*3/uL — ABNORMAL HIGH (ref 4.0–10.5)
nRBC: 0 % (ref 0.0–0.2)

## 2021-04-21 LAB — RESP PANEL BY RT-PCR (FLU A&B, COVID) ARPGX2
Influenza A by PCR: NEGATIVE
Influenza B by PCR: NEGATIVE
SARS Coronavirus 2 by RT PCR: NEGATIVE

## 2021-04-21 LAB — BASIC METABOLIC PANEL
Anion gap: 11 (ref 5–15)
BUN: 20 mg/dL (ref 8–23)
CO2: 27 mmol/L (ref 22–32)
Calcium: 8.2 mg/dL — ABNORMAL LOW (ref 8.9–10.3)
Chloride: 99 mmol/L (ref 98–111)
Creatinine, Ser: 0.79 mg/dL (ref 0.44–1.00)
GFR, Estimated: >60 mL/min (ref >60)
Glucose, Bld: 138 mg/dL — ABNORMAL HIGH (ref 70–99)
Potassium: 3.4 mmol/L — ABNORMAL LOW (ref 3.5–5.1)
Sodium: 137 mmol/L (ref 135–145)

## 2021-04-21 MED ORDER — VITAMIN A 3 MG (10000 UNIT) PO CAPS
20000.0000 [IU] | ORAL_CAPSULE | Freq: Every day | ORAL | Status: DC
  Administered 2021-04-21 – 2021-04-22 (×2): 20000 [IU] via ORAL
  Filled 2021-04-21 (×2): qty 2

## 2021-04-21 MED ORDER — PREDNISONE 20 MG PO TABS
30.0000 mg | ORAL_TABLET | Freq: Every day | ORAL | Status: DC
  Administered 2021-04-22: 30 mg via ORAL
  Filled 2021-04-21: qty 1

## 2021-04-21 MED ORDER — POTASSIUM CHLORIDE CRYS ER 20 MEQ PO TBCR
40.0000 meq | EXTENDED_RELEASE_TABLET | Freq: Once | ORAL | Status: AC
  Administered 2021-04-21: 40 meq via ORAL
  Filled 2021-04-21: qty 2

## 2021-04-21 MED ORDER — FUROSEMIDE 20 MG PO TABS
20.0000 mg | ORAL_TABLET | Freq: Every day | ORAL | Status: DC
  Administered 2021-04-21 – 2021-04-22 (×2): 20 mg via ORAL
  Filled 2021-04-21 (×2): qty 1

## 2021-04-21 MED ORDER — IPRATROPIUM-ALBUTEROL 0.5-2.5 (3) MG/3ML IN SOLN
3.0000 mL | Freq: Two times a day (BID) | RESPIRATORY_TRACT | Status: DC
  Administered 2021-04-21 – 2021-04-22 (×2): 3 mL via RESPIRATORY_TRACT
  Filled 2021-04-21 (×2): qty 3

## 2021-04-21 MED ORDER — UMECLIDINIUM BROMIDE 62.5 MCG/ACT IN AEPB
1.0000 | INHALATION_SPRAY | Freq: Every day | RESPIRATORY_TRACT | Status: DC
  Administered 2021-04-21 – 2021-04-22 (×2): 1 via RESPIRATORY_TRACT
  Filled 2021-04-21: qty 7

## 2021-04-21 MED ORDER — SELENIUM 200 MCG PO TABS
100.0000 ug | ORAL_TABLET | Freq: Every day | ORAL | Status: DC
  Filled 2021-04-21: qty 2

## 2021-04-21 MED ORDER — PROSOURCE PLUS PO LIQD
30.0000 mL | Freq: Three times a day (TID) | ORAL | Status: DC
  Administered 2021-04-21 – 2021-04-22 (×2): 30 mL via ORAL
  Filled 2021-04-21 (×2): qty 30

## 2021-04-21 MED ORDER — BOOST / RESOURCE BREEZE PO LIQD CUSTOM
1.0000 | Freq: Three times a day (TID) | ORAL | Status: DC
  Administered 2021-04-22: 1 via ORAL

## 2021-04-21 MED ORDER — ARFORMOTEROL TARTRATE 15 MCG/2ML IN NEBU
15.0000 ug | INHALATION_SOLUTION | Freq: Two times a day (BID) | RESPIRATORY_TRACT | Status: DC
  Administered 2021-04-21 – 2021-04-22 (×3): 15 ug via RESPIRATORY_TRACT
  Filled 2021-04-21 (×3): qty 2

## 2021-04-21 MED ORDER — ACETAMINOPHEN 500 MG PO TABS
1000.0000 mg | ORAL_TABLET | Freq: Three times a day (TID) | ORAL | Status: DC
  Administered 2021-04-21 – 2021-04-22 (×3): 1000 mg via ORAL
  Filled 2021-04-21 (×3): qty 2

## 2021-04-21 MED ORDER — ADULT MULTIVITAMIN W/MINERALS CH
1.0000 | ORAL_TABLET | Freq: Every day | ORAL | Status: DC
  Filled 2021-04-21: qty 1

## 2021-04-21 MED ORDER — VITAMIN B-12 1000 MCG PO TABS
1000.0000 ug | ORAL_TABLET | Freq: Every day | ORAL | Status: DC
  Administered 2021-04-21 – 2021-04-22 (×2): 1000 ug via ORAL
  Filled 2021-04-21 (×2): qty 1

## 2021-04-21 MED ORDER — ASCORBIC ACID 500 MG PO TABS
500.0000 mg | ORAL_TABLET | Freq: Two times a day (BID) | ORAL | Status: DC
  Administered 2021-04-21 – 2021-04-22 (×2): 500 mg via ORAL
  Filled 2021-04-21 (×2): qty 1

## 2021-04-21 NOTE — Plan of Care (Signed)

## 2021-04-21 NOTE — Progress Notes (Addendum)
PROGRESS NOTE        PATIENT DETAILS Name: [name] Age: 87 y.o. Sex: female Date of Birth: September 24, 1934 Admit Date: 04/19/2021 Admitting Physician Shela Leff, MD IEP:PIRJJ, Annie Main, MD  Brief Summary: Patient is a 87 y.o.  female with history of COPD with chronic hypoxic respiratory failure on 4 L of oxygen at home, ?ILD, VTE on Eliquis, HFpEF-presenting with shortness of breath-felt to have COPD exacerbation.  See below for further details.  Significant events: 1/12-1/18>> Hospitalization for acute on chronic hypoxia due to PE and a mechanical fall resulting in left fifth/sixth and seventh rib fractures.  Discharged to SNF 2/19>> presenting to [facility] for shortness of breath-found to have COPD exacerbation.  Significant imaging studies: 1/16>> TTE: EF 88-41%, grade 1 diastolic dysfunction, RVSP 80.3. 2/19>> CXR: Diffuse interstitial prominence in the lungs bilaterally, stable left rib fractures. 2/19>> x-ray sacrum/coccyx: Compression deformity at L4 and L5 2/19>> CT head/C-spine: No acute intracranial process-no fracture. 2/19>> CT thoracic spine: No traumatic injury-chronic compression deformities involving T3, T7, T8, T10-T12.  Significant microbiology data:   Procedures: None  Consults:  None  Subjective: Lying comfortably in bed-denies any chest pain or shortness of breath.  Objective: Vitals: Blood pressure 118/67, pulse 88, temperature 98.3 F (36.8 C), temperature source Oral, resp. rate 13, height 5\' 3"  (1.6 m), weight 59.5 kg, SpO2 98 %.   Exam: Gen Exam:Alert awake-not in any distress HEENT:atraumatic, normocephalic Chest: B/L clear to auscultation anteriorly CVS:S1S2 regular Abdomen:soft non tender, non distended Extremities:no edema Neurology: Non focal Skin: no rash  Pertinent Labs/Radiology: CBC Latest Ref Rng & Units 04/21/2021 04/19/2021 03/16/2021  WBC 4.0 - 10.5 K/uL 13.5(H) 9.9 10.1  Hemoglobin 12.0 - 15.0  g/dL 11.6(L) 13.4 12.9  Hematocrit 36.0 - 46.0 % 36.5 44.2 40.7  Platelets 150 - 400 K/uL 391 455(H) 512(H)    Lab Results  Component Value Date   NA 137 04/21/2021   K 3.4 (L) 04/21/2021   CL 99 04/21/2021   CO2 27 04/21/2021      Assessment/Plan: Acute on chronic hypoxic respiratory failure due to COPD exacerbation and underlying ILD: Improved-hardly any wheezing today-continue bronchodilators-continue to taper down prednisone.  Attempt to titrate down FiO2 to her usual baseline of 4 L.  Chronic HFpEF: Do not think patient has any evidence of decompensated heart failure-stop IV Lasix-transition to oral diuretics.  Cellulitis involving LLE: Mild erythema-but do not see any major concerning findings on exam-continue doxycycline-await lower extremity Dopplers.  Recent pulmonary embolism January 2023: Continue Eliquis  History of mechanical fall with left fifth, sixth and seventh rib fractures in January 2023: Continue supportive care  Multiple chronic compression fractures: Seen incidentally on CT imaging-stable for outpatient follow-up with PCP-for osteoporosis work-up.  Minimally elevated troponins: No anginal symptoms-doubt any clinical significance of minimally elevated troponins.    Pulmonary hypertension: Recent echo showed severe pulmonary hypertension-suspect cor pulmonale in the setting of advanced COPD  DM-2 (A1c 5.2 on 2/20): CBGs stable with SSI.  Recent Labs    04/20/21 1600 04/20/21 2128 04/21/21 0804  GLUCAP 179* 125* 126*    Peripheral neuropathy: Likely related to DM-continue Neurontin.  HLD: Continue statin  Hypothyroidism: Continue Synthroid  Chronic hypotension: Apparently was started on midodrine recently-BP stable-remains on midodrine.  Physical deconditioning/debility: Evaluated by PT-recommendations are for home health services.  Nutrition Status: Nutrition Problem: Moderate Malnutrition Etiology:  chronic illness (COPD, CHF) Signs/Symptoms:  severe muscle depletion, severe fat depletion Interventions: Boost Breeze, Prostat, MVI  BMI: Estimated body mass index is 23.24 kg/m as calculated from the following:   Height as of this encounter: 5\' 3"  (1.6 m).   Weight as of this encounter: 59.5 kg.   Code status:   Code Status: DNR   DVT Prophylaxis: apixaban (ELIQUIS) tablet 5 mg     Family Communication: None at bedside  Disposition Plan: Status is: Inpatient Remains inpatient appropriate because: Improving COPD exacerbation-suspect needs another day or so of hospitalization before consideration of discharge.  Attempt to  titrate down FiO2 to usual 4 L.   Planned Discharge Destination:Home health   Diet: Diet Order             Diet heart healthy/carb modified Room service appropriate? Yes; Fluid consistency: Thin; Fluid restriction: 1200 mL Fluid  Diet effective now                     Antimicrobial agents: Anti-infectives (From admission, onward)    Start     Dose/Rate Route Frequency Ordered Stop   04/20/21 0045  doxycycline (VIBRA-TABS) tablet 100 mg        100 mg Oral Every 12 hours 04/20/21 0044          MEDICATIONS: Scheduled Meds:  acetaminophen  1,000 mg Oral Q8H   apixaban  5 mg Oral BID   atorvastatin  40 mg Oral Daily   budesonide (PULMICORT) nebulizer solution  0.25 mg Nebulization BID   doxycycline  100 mg Oral Q12H   furosemide  20 mg Intravenous Q12H   gabapentin  100 mg Oral 2 times per day   gabapentin  300 mg Oral QHS   insulin aspart  0-5 Units Subcutaneous QHS   insulin aspart  0-9 Units Subcutaneous TID WC   ipratropium-albuterol  3 mL Nebulization BID   levothyroxine  100 mcg Oral Q0600   midodrine  5 mg Oral BID WC   pantoprazole  40 mg Oral Daily   predniSONE  40 mg Oral Q breakfast   Continuous Infusions: PRN Meds:.albuterol, loratadine, ondansetron (ZOFRAN) IV, phenol, traMADol   I have personally reviewed following labs and imaging studies  LABORATORY  DATA: CBC: Recent Labs  Lab 04/19/21 2101 04/21/21 0340  WBC 9.9 13.5*  NEUTROABS 8.1*  --   HGB 13.4 11.6*  HCT 44.2 36.5  MCV 88.6 85.7  PLT 455* 893    Basic Metabolic Panel: Recent Labs  Lab 04/19/21 2101 04/21/21 0340  NA 136 137  K 3.8 3.4*  CL 100 99  CO2 25 27  GLUCOSE 113* 138*  BUN 21 20  CREATININE 0.78 0.79  CALCIUM 8.2* 8.2*    GFR: Estimated Creatinine Clearance: 42.5 mL/min (by C-G formula based on SCr of 0.79 mg/dL).  Liver Function Tests: Recent Labs  Lab 04/19/21 2101  AST 34  ALT 15  ALKPHOS 84  BILITOT 1.2  PROT 7.0  ALBUMIN 2.9*   No results for input(s): LIPASE, AMYLASE in the last 168 hours. No results for input(s): AMMONIA in the last 168 hours.  Coagulation Profile: No results for input(s): INR, PROTIME in the last 168 hours.  Cardiac Enzymes: No results for input(s): CKTOTAL, CKMB, CKMBINDEX, TROPONINI in the last 168 hours.  BNP (last 3 results) No results for input(s): PROBNP in the last 8760 hours.  Lipid Profile: No results for input(s): CHOL, HDL, LDLCALC, TRIG, CHOLHDL, LDLDIRECT in the last  72 hours.  Thyroid Function Tests: No results for input(s): TSH, T4TOTAL, FREET4, T3FREE, THYROIDAB in the last 72 hours.  Anemia Panel: No results for input(s): VITAMINB12, FOLATE, FERRITIN, TIBC, IRON, RETICCTPCT in the last 72 hours.  Urine analysis:    Component Value Date/Time   COLORURINE ORANGE (A) 03/12/2021 2057   APPEARANCEUR CLEAR 03/12/2021 2057   LABSPEC 1.029 03/12/2021 2057   PHURINE 5.5 03/12/2021 2057   GLUCOSEU NEGATIVE 03/12/2021 2057   HGBUR TRACE (A) 03/12/2021 2057   BILIRUBINUR SMALL (A) 03/12/2021 2057   KETONESUR 40 (A) 03/12/2021 2057   PROTEINUR 30 (A) 03/12/2021 2057   UROBILINOGEN 0.2 04/10/2014 2045   NITRITE NEGATIVE 03/12/2021 2057   LEUKOCYTESUR NEGATIVE 03/12/2021 2057    Sepsis Labs: Lactic Acid, Venous    Component Value Date/Time   LATICACIDVEN 0.90 04/10/2013 0125     MICROBIOLOGY: No results found for this or any previous visit (from the past 240 hour(s)).  RADIOLOGY STUDIES/RESULTS: DG Chest 2 View  Result Date: 04/19/2021 CLINICAL DATA:  Shortness of breath, fall at home on hard floor. EXAM: CHEST - 2 VIEW COMPARISON:  03/14/2021. FINDINGS: The heart size and mediastinal contours are stable. There is atherosclerotic calcification of the aorta. Prominent interstitial markings are present in the lungs bilaterally, slightly improved from the prior exam. There is a small right pleural effusion. No pneumothorax is seen. Fracture deformities in the ribs on the left are unchanged. Surgical changes are present over the left chest. IMPRESSION: 1. Diffuse interstitial prominence in the lungs bilaterally, suggesting interstitial lung disease, slightly improved from the prior exam. 2. Small right pleural effusion. 3. Stable left rib fractures. Electronically Signed   By: Brett Fairy M.D.   On: 04/19/2021 21:33   DG Sacrum/Coccyx  Result Date: 04/19/2021 CLINICAL DATA:  Fall at home on hard floor. EXAM: SACRUM AND COCCYX - 2+ VIEW COMPARISON:  12/18/2010, 10/30/2012. FINDINGS: There is no evidence of acute sacral fracture. There is a compression deformity at L5 which is indeterminate in age. A compression deformity is noted at L4 with progression from 2014. Fixation hardware is present in the proximal left femur. Degenerative changes are noted at the sacroiliac joints bilaterally. Vascular calcifications are noted in the pelvis. IMPRESSION: 1. Compression deformity at L5 which is new from 2014 and indeterminate in age. 2. Compression deformity at L4 with progression since 2014. 3. No definite evidence of sacral fracture. Electronically Signed   By: Brett Fairy M.D.   On: 04/19/2021 21:39   CT HEAD WO CONTRAST (5MM)  Result Date: 04/19/2021 CLINICAL DATA:  Fall, head and neck trauma. EXAM: CT HEAD WITHOUT CONTRAST CT CERVICAL SPINE WITHOUT CONTRAST TECHNIQUE:  Multidetector CT imaging of the head and cervical spine was performed following the standard protocol without intravenous contrast. Multiplanar CT image reconstructions of the cervical spine were also generated. RADIATION DOSE REDUCTION: This exam was performed according to the departmental dose-optimization program which includes automated exposure control, adjustment of the mA and/or kV according to patient size and/or use of iterative reconstruction technique. COMPARISON:  03/12/2021. FINDINGS: CT HEAD FINDINGS Brain: No acute intracranial hemorrhage, midline shift or mass effect. No extra-axial fluid collection. Diffuse atrophy is noted. Subcortical and periventricular white matter hypodensities are present bilaterally. No hydrocephalus. Vascular: No hyperdense vessel or unexpected calcification. Skull: Normal. Negative for fracture or focal lesion. Sinuses/Orbits: Mucosal thickening is present in the maxillary sinuses and frontal sinus on the right. There is debris in the left sphenoid sinus. No acute orbital abnormality. Other:  None. CT CERVICAL SPINE FINDINGS Alignment: There is mild anterolisthesis at C7-T1. Skull base and vertebrae: There is a compression deformity in the superior endplate at T62 which was likely present on the prior exam. No acute cervical spine fracture is noted. Soft tissues and spinal canal: No prevertebral fluid or swelling. No visible canal hematoma. Disc levels: Multilevel intervertebral disc space narrowing, uncovertebral osteophyte formation and facet arthropathy is noted. Upper chest: Interstitial prominence in cystic spaces are present at the lung apices bilaterally. There is atherosclerotic calcification of the aorta. Other: None. IMPRESSION: 1. No acute intracranial process. 2. Atrophy with chronic microvascular ischemic changes. 3. No acute fracture in the cervical spine. 4. Compression deformity in the superior endplate of T3 which is unchanged from the prior exam. 5.  Findings suggestive interstitial lung disease and/or emphysema at the lung apices. Electronically Signed   By: Brett Fairy M.D.   On: 04/19/2021 23:03   CT Cervical Spine Wo Contrast  Result Date: 04/19/2021 CLINICAL DATA:  Fall, head and neck trauma. EXAM: CT HEAD WITHOUT CONTRAST CT CERVICAL SPINE WITHOUT CONTRAST TECHNIQUE: Multidetector CT imaging of the head and cervical spine was performed following the standard protocol without intravenous contrast. Multiplanar CT image reconstructions of the cervical spine were also generated. RADIATION DOSE REDUCTION: This exam was performed according to the departmental dose-optimization program which includes automated exposure control, adjustment of the mA and/or kV according to patient size and/or use of iterative reconstruction technique. COMPARISON:  03/12/2021. FINDINGS: CT HEAD FINDINGS Brain: No acute intracranial hemorrhage, midline shift or mass effect. No extra-axial fluid collection. Diffuse atrophy is noted. Subcortical and periventricular white matter hypodensities are present bilaterally. No hydrocephalus. Vascular: No hyperdense vessel or unexpected calcification. Skull: Normal. Negative for fracture or focal lesion. Sinuses/Orbits: Mucosal thickening is present in the maxillary sinuses and frontal sinus on the right. There is debris in the left sphenoid sinus. No acute orbital abnormality. Other: None. CT CERVICAL SPINE FINDINGS Alignment: There is mild anterolisthesis at C7-T1. Skull base and vertebrae: There is a compression deformity in the superior endplate at B63 which was likely present on the prior exam. No acute cervical spine fracture is noted. Soft tissues and spinal canal: No prevertebral fluid or swelling. No visible canal hematoma. Disc levels: Multilevel intervertebral disc space narrowing, uncovertebral osteophyte formation and facet arthropathy is noted. Upper chest: Interstitial prominence in cystic spaces are present at the lung  apices bilaterally. There is atherosclerotic calcification of the aorta. Other: None. IMPRESSION: 1. No acute intracranial process. 2. Atrophy with chronic microvascular ischemic changes. 3. No acute fracture in the cervical spine. 4. Compression deformity in the superior endplate of T3 which is unchanged from the prior exam. 5. Findings suggestive interstitial lung disease and/or emphysema at the lung apices. Electronically Signed   By: Brett Fairy M.D.   On: 04/19/2021 23:03   CT Thoracic Spine Wo Contrast  Result Date: 04/20/2021 CLINICAL DATA:  Initial evaluation for acute trauma, fall. EXAM: CT THORACIC SPINE WITHOUT CONTRAST TECHNIQUE: Multidetector CT images of the thoracic were obtained using the standard protocol without intravenous contrast. RADIATION DOSE REDUCTION: This exam was performed according to the departmental dose-optimization program which includes automated exposure control, adjustment of the mA and/or kV according to patient size and/or use of iterative reconstruction technique. COMPARISON:  Comparison made with prior CT from 03/12/2021 and MRI from 05/04/2016. FINDINGS: Alignment: Sigmoid scoliotic curvature of the thoracic spine with exaggeration of the normal thoracic kyphosis. No listhesis. Vertebrae: Compression deformities involving the  T3, T7, and T8 vertebral bodies, grossly stable from previous, consistent with chronic fractures. Additional compression fractures involving the T10, T11, and T12 vertebral bodies also favored to be chronic in nature. No visible acute vertebral body fracture. Subacute fractures involving the left posterolateral fourth, fifth, and sixth ribs noted. Few additional chronic remotely healed rib fractures noted. No discrete or worrisome osseous lesions. Paraspinal and other soft tissues: Paraspinous soft tissues demonstrate no acute finding. Small layering bilateral pleural effusions. Moderate aortic atherosclerosis. Moderate hiatal hernia. Severe  emphysematous changes with associated bronchiectasis noted within the visualized lungs. Surgical clips present within the left axillary region. Disc levels: Mild multilevel disc desiccation seen throughout the mid and lower thoracic spine. Mild scattered facet arthrosis. No significant spinal stenosis. IMPRESSION: 1. No acute traumatic injury within the thoracic spine. 2. Chronic compression deformities involving the T3, T7, T8, and T10 through T12 vertebral bodies. 3. Subacute fractures involving the left posterolateral fourth, fifth, and sixth ribs. 4. Small layering bilateral pleural effusions. 5. Moderate hiatal hernia. 6. Aortic Atherosclerosis (ICD10-I70.0) and Emphysema (ICD10-J43.9). Electronically Signed   By: Jeannine Boga M.D.   On: 04/20/2021 00:04   CT Lumbar Spine Wo Contrast  Result Date: 04/19/2021 CLINICAL DATA:  Initial evaluation for acute trauma, fall. EXAM: CT LUMBAR SPINE WITHOUT CONTRAST TECHNIQUE: Multidetector CT imaging of the lumbar spine was performed without intravenous contrast administration. Multiplanar CT image reconstructions were also generated. RADIATION DOSE REDUCTION: This exam was performed according to the departmental dose-optimization program which includes automated exposure control, adjustment of the mA and/or kV according to patient size and/or use of iterative reconstruction technique. COMPARISON:  Prior MRI from 10/31/2012. FINDINGS: Segmentation: Standard. Lowest well-formed disc space labeled the L5-S1 level. Alignment: 3 mm facet mediated anterolisthesis of L4 on L5. Alignment otherwise normal with preservation of the normal lumbar lordosis. Vertebrae: Chronic compression deformities involving the L2 and L3 vertebral bodies with sequelae of prior vertebral augmentation. Additional chronic compression deformities involving the L1, L4, and L5 vertebral bodies noted as well. No acute fracture. Visualized sacrum and pelvis intact. No worrisome osseous lesions.  Paraspinal and other soft tissues: Paraspinous soft tissues demonstrate no acute finding. Asymmetric atrophy noted involving the left psoas muscle. Advanced aorto bi-iliac atherosclerotic disease. Disc levels: L1-2: Mild disc bulge up to 5 mm bony retropulsion related to the chronic L2 compression fracture. Mild bilateral facet hypertrophy. Resultant mild spinal stenosis. Foramina remain patent. L2-3: Mild diffuse disc bulge. Up to 3 mm bony retropulsion related to the chronic L3 compression fracture. Moderate bilateral facet hypertrophy. Resultant moderate canal with right worse than left lateral recess stenosis. Foramina remain patent. L3-4: Mild disc bulge. Superimposed small right extraforaminal disc protrusion closely approximates the exiting right L3 nerve root (series 5, image 49). Moderate to advanced bilateral facet arthrosis. Resultant moderate canal with bilateral subarticular stenosis. Mild bilateral L3 foraminal narrowing. L4-5: Trace anterolisthesis. Mild disc bulge with disc desiccation. Severe bilateral facet arthrosis. Prior right hemi laminectomy. No significant spinal stenosis. Moderate bilateral L4 foraminal stenosis. L5-S1: Minimal disc bulge with reactive endplate change. Moderate right worse than left facet hypertrophy. No spinal stenosis. Foramina remain patent. IMPRESSION: 1. No acute traumatic injury within the lumbar spine. 2. Chronic compression deformities involving the L1 through L5 vertebral bodies with sequelae of prior vertebral augmentation at L2 and L3. 3. Underlying moderate multilevel degenerative spondylosis and facet arthrosis, with resultant moderate spinal stenosis at L2-3 and L3-4. Moderate bilateral L4 foraminal narrowing. 4. Aortic Atherosclerosis (ICD10-I70.0). Electronically Signed  By: Jeannine Boga M.D.   On: 04/19/2021 23:50     LOS: 1 day   Oren Binet, MD  [facility]    To contact the attending provider between 7A-7P or the covering  provider during after hours 7P-7A, please log into the web site [email or web address] and access using universal [place] password for that web site. If you do not have the password, please call the hospital operator.  04/21/2021, 11:48 AM

## 2021-04-21 NOTE — Progress Notes (Signed)
Initial Nutrition Assessment  DOCUMENTATION CODES:   Severe malnutrition in context of chronic illness  INTERVENTION:  - Boost Breeze po TID, each supplement provides 250 kcal and 9 grams of protein - Prosource plus PO 39m, TID, each supplement provides 250 kcal and 9 grams of protein - MVI daily  - Liberalize diet to 2 gram sodium with 1200 mL fluid restriction given pt is severely malnourished and heart healthy/carb mod diet severely limits protein/calories.  - Provided education on optimizing nutrition status  Recommend repleting the following vitamins/minerals given deficiencies:  -Vitamin A: 20,000 IU PO daily x two weeks  -Vitamin B12: 1000 mcg PO daily (recommend rechecking levels in 2-4 weeks to determine need for ongoing supplementation)  -Selenium: 100 mcg PO daily x two weeks  -Vitamin C: 500 mg PO BID (recommend rechecking levels in 2-4 weeks to determine need for ongoing supplementation)   Vitamin B7 is deficient, however, per pharmacy we do not have any in stock. Note that MVI contains biotin. Recommend considering repleting value as outpatient under guidance of PCP.    NUTRITION DIAGNOSIS:   Severe Malnutrition related to chronic illness (COPD, CHF) as evidenced by severe muscle depletion, severe fat depletion.   GOAL:   Patient will meet greater than or equal to 90% of their needs   MONITOR:   PO intake, Supplement acceptance, Labs, Weight trends, Skin  REASON FOR ASSESSMENT:   Consult Assessment of nutrition requirement/status  ASSESSMENT:   Pt is a 87year old female with PMH of chronic diastolic CHF, COPD, chronic hypoxic respiratory failure 4L 02 at baseline, DVT/PE, HTN, HLD, T2DM, hypothyroidism, depression and anxiety, history of breast cancer with recent admission on 03/12/2021 for acute on chronic hypoxic respiratory failure after a mechanical fall found to have PE, started on anticoagulation, fractures of left rib, and T3 compression fracture. Pt  was discharged to SNF on 03/18/2021 and presented to the ED on 2/20 due to shortness of breath and sacral pain due to fall and was admitted for management of acute on chronic hypoxia respiratory failure due to COPD exacerbation.  During previous admission, checked for vitamin/mineral deficiencies due to concern for malabsorption and GI losses secondary to chronic diarrhea. Pt found to be deficient in Vitamin A, Vitamin B-12, Vitamin C, Vitamin B-7, and selenium. Results of labs came in after pt had discharged from hospital, so RD (Estill Bamberg sent secure message to pt's PCP informing them of multiple deficiencies and need for repletion. Per pt/family report, these labs were not repleted outpatient as pt reports she cancelled her primary care visit and was not made aware of deficiencies. Spoke to MD who is agreeable to repleting these labs. Also discussed repletion with pharmacy.    Pt currently on a heart healthy/carb-modified diet, thin liquids; 1200 mL fluid restriction. Dietetic intern and RD reached out to MD to liberalize pt's diet to low sodium to promote PO intake and allow for more food options on trays.    PO intake: none documented  RN in room during time of visit. Met with pt at bedside. Pt's son present during visit. Per pt, pt endorses a good appetite today and states that her appetite is getting better. Pt reports that for breakfast today she ate grits, eggs, and half of an english muffin. Pt denies any nausea, constipation or diarrhea today. Pt reports that prior to admission, her appetite was fair and that she typically picks over her food, but likes to snack on Cheez-its. Pt reports that she does not  like Ensure products but is willing to try Boost Breeze products again as she states that she really enjoyed these the last time she was admitted. Per pt, she reports that she lives at home with her son who is very supportive and protective of her. Pt reports that she uses a walker at baseline to  ambulate.    Discussed the importance of good nutrition and PO intake to meet nutritional needs. Pt agreeable to Boost Breeze (peach or wild-berry flavored) and Prosource to aid in caloric and protein intake. Discussed MVI supplementation with pt and pt agreeable to MVI.    Medications:   acetaminophen  1,000 mg Oral Q8H   apixaban  5 mg Oral BID   arformoterol  15 mcg Nebulization BID   vitamin C  500 mg Oral BID   atorvastatin  40 mg Oral Daily   budesonide (PULMICORT) nebulizer solution  0.25 mg Nebulization BID   doxycycline  100 mg Oral Q12H   furosemide  20 mg Oral Daily   gabapentin  100 mg Oral 2 times per day   gabapentin  300 mg Oral QHS   insulin aspart  0-5 Units Subcutaneous QHS   insulin aspart  0-9 Units Subcutaneous TID WC   ipratropium-albuterol  3 mL Nebulization BID   levothyroxine  100 mcg Oral Q0600   midodrine  5 mg Oral BID WC   multivitamin with minerals  1 tablet Oral Daily   pantoprazole  40 mg Oral Daily   [START ON 04/22/2021] predniSONE  30 mg Oral Q breakfast   [START ON 04/22/2021] selenium  100 mcg Oral Daily   umeclidinium bromide  1 puff Inhalation Daily   vitamin A  20,000 Units Oral Daily   vitamin B-12  1,000 mcg Oral Daily   Labs reviewed and include:  K: 3.4 CBGs: 125-130 x 24 hours   Vitamin and mineral profile from previous admission 03/14/21)  Vitamin B12: 173 (L) Biotin B7: <0.05 ng/mL (L)   Vitamin A: 10.1 ug/dL (L) Vitamin C: 0.3 (L)  Selenium: 77 ug/ L (L)    NUTRITION - FOCUSED PHYSICAL EXAM:  Flowsheet Row Most Recent Value  Orbital Region Severe depletion  Upper Arm Region Moderate depletion  Thoracic and Lumbar Region Moderate depletion  Buccal Region Severe depletion  Temple Region Moderate depletion  Clavicle Bone Region Moderate depletion  Clavicle and Acromion Bone Region Moderate depletion  Scapular Bone Region Moderate depletion  Dorsal Hand Moderate depletion  Patellar Region Severe depletion  Anterior Thigh  Region Severe depletion  Posterior Calf Region Severe depletion  Edema (RD Assessment) Moderate  [LLE,  RLE]  Hair Other (Comment)  [thinning,  hair loss]  Eyes Reviewed  Mouth Reviewed  Skin Reviewed   Nails Reviewed       Diet Order:   Diet Order             Diet heart healthy/carb modified Room service appropriate? Yes; Fluid consistency: Thin; Fluid restriction: 1200 mL Fluid  Diet effective now                   EDUCATION NEEDS:   Education needs have been addressed  Skin:  Skin Assessment: Reviewed RN Assessment  Last BM:  2/20  Height:   Ht Readings from Last 1 Encounters:  04/20/21 _0  (1.6 m)    Weight:   Wt Readings from Last 1 Encounters:  04/20/21 59.5 kg    BMI:  Body mass index is 23.24 kg/m.  Estimated Nutritional Needs:   Kcal:  1750 - 1950  Protein:  85 - 100 grams  Fluid:  >/= 1.7 L    Maryruth Hancock, Dietetic Intern 04/21/2021 3:13 PM

## 2021-04-21 NOTE — Progress Notes (Signed)
Lower extremity venous bilateral study completed.   Please see CV Proc for preliminary results.   Krue Peterka, RDMS, RVT  

## 2021-04-21 NOTE — Progress Notes (Signed)
Occupational Therapy Treatment Patient Details Name: [name] MRN: [id number] DOB: 1934-05-28 Today's Date: 04/21/2021   History of present illness Pt is an 87 y/o F presenting to ED on 2/19 for SOB and tailbone pain from mechanical fall. PMH includes DVT, hypothyroidism, recurrent falls, and home oxygen use.   OT comments  Pt progressing towards goals, fatigued this session. Pt needing min A for ADLs and transfers, mod A for bed mobility for trunk elevation. Pt SpO2 dropping into low 80's on 4L O2 during session, increased to 6L O2, SpO2 92%. Pt able to ambulate to bathroom and complete standing ADL during session, returned back to 4L O2 after pt up in chair per RN. Pt presenting with impairments listed below, will follow acutely. Continue to recommend HHOT at d/c.   Recommendations for follow up therapy are one component of a multi-disciplinary discharge planning process, led by the attending physician.  Recommendations may be updated based on patient status, additional functional criteria and insurance authorization.    Follow Up Recommendations  Home health OT    Assistance Recommended at Discharge Set up Supervision/Assistance  Patient can return home with the following  A little help with walking and/or transfers;A little help with bathing/dressing/bathroom;Assistance with cooking/housework;Assist for transportation;Help with stairs or ramp for entrance   Equipment Recommendations  BSC/3in1 (as shower seat)    Recommendations for Other Services      Precautions / Restrictions Precautions Precautions: Fall Precaution Comments: watch O2 Restrictions Weight Bearing Restrictions: No       Mobility Bed Mobility Overal bed mobility: Needs Assistance Bed Mobility: Supine to Sit     Supine to sit: HOB elevated, Mod assist     General bed mobility comments: mod A for trunk elevation    Transfers Overall transfer level: Needs assistance Equipment used: Rolling  walker (2 wheels) Transfers: Sit to/from Stand Sit to Stand: Min assist                 Balance Overall balance assessment: Needs assistance Sitting-balance support: Feet supported Sitting balance-Leahy Scale: Fair   Postural control: Posterior lean Standing balance support: During functional activity, Reliant on assistive device for balance Standing balance-Leahy Scale: Good Standing balance comment: posterior lean at times                           ADL either performed or assessed with clinical judgement   ADL Overall ADL's : Needs assistance/impaired Eating/Feeding: Set up;Sitting   Grooming: Oral care;Wash/dry face;Wash/dry hands;Standing;Min guard Grooming Details (indicate cue type and reason): completed standing at sink                 Toilet Transfer: Rolling walker (2 wheels);Minimal assistance;Ambulation;Regular Museum/gallery exhibitions officer and Hygiene: Supervision/safety;Sitting/lateral lean Toileting - Clothing Manipulation Details (indicate cue type and reason): completes pericare     Functional mobility during ADLs: Minimal assistance;Rolling walker (2 wheels)      Extremity/Trunk Assessment Upper Extremity Assessment Upper Extremity Assessment: Generalized weakness   Lower Extremity Assessment Lower Extremity Assessment: Defer to PT evaluation        Vision   Vision Assessment?: No apparent visual deficits   Perception Perception Perception: Not tested   Praxis Praxis Praxis: Not tested    Cognition Arousal/Alertness: Awake/alert Behavior During Therapy: WFL for tasks assessed/performed Overall Cognitive Status: No family/caregiver present to determine baseline cognitive functioning  Exercises      Shoulder Instructions       General Comments pt on 4L O2, bumped up to 6L O2 during bathroom ambulation due to SpO2 being in low 80's. Returned back  to 4L O2 once seated in chair, SpO2 89-90%    Pertinent Vitals/ Pain       Pain Assessment Pain Assessment: No/denies pain  Home Living                                          Prior Functioning/Environment              Frequency  Min 2X/week        Progress Toward Goals  OT Goals(current goals can now be found in the care plan section)  Progress towards OT goals: Progressing toward goals  Acute Rehab OT Goals Patient Stated Goal: none stated OT Goal Formulation: With patient Time For Goal Achievement: 05/04/21 Potential to Achieve Goals: Good ADL Goals Pt Will Perform Upper Body Dressing: with supervision;sitting Pt Will Perform Lower Body Dressing: with min assist;sit to/from stand;sitting/lateral leans Pt Will Transfer to Toilet: with supervision;ambulating;regular height toilet Pt Will Perform Tub/Shower Transfer: with min assist;Shower transfer;rolling walker;3 in 1  Plan Discharge plan remains appropriate;Frequency remains appropriate    Co-evaluation                 AM-PAC OT "6 Clicks" Daily Activity     Outcome Measure   Help from another person eating meals?: None Help from another person taking care of personal grooming?: A Little Help from another person toileting, which includes using toliet, bedpan, or urinal?: A Little Help from another person bathing (including washing, rinsing, drying)?: A Lot Help from another person to put on and taking off regular upper body clothing?: A Little Help from another person to put on and taking off regular lower body clothing?: A Lot 6 Click Score: 17    End of Session Equipment Utilized During Treatment: Gait belt;Rolling walker (2 wheels);Oxygen  OT Visit Diagnosis: Unsteadiness on feet (R26.81);Other abnormalities of gait and mobility (R26.89);Repeated falls (R29.6);History of falling (Z91.81);Muscle weakness (generalized) (M62.81)   Activity Tolerance Patient tolerated treatment  well   Patient Left in chair;with call bell/phone within reach;with nursing/sitter in room;with family/visitor present   Nurse Communication Mobility status        Time: 8295-6213 OT Time Calculation (min): 22 min  Charges: OT General Charges $OT Visit: 1 Visit OT Treatments $Self Care/Home Management : 8-22 mins  Lynnda Child, OTD, OTR/L Acute Rehab [phone]) [phone]   Kaylyn Lim 04/21/2021, 1:45 PM

## 2021-04-21 NOTE — Plan of Care (Signed)

## 2021-04-22 ENCOUNTER — Other Ambulatory Visit (HOSPITAL_COMMUNITY): Payer: Self-pay

## 2021-04-22 DIAGNOSIS — R5381 Other malaise: Secondary | ICD-10-CM

## 2021-04-22 DIAGNOSIS — Z86711 Personal history of pulmonary embolism: Secondary | ICD-10-CM

## 2021-04-22 LAB — GLUCOSE, CAPILLARY
Glucose-Capillary: 101 mg/dL — ABNORMAL HIGH (ref 70–99)
Glucose-Capillary: 117 mg/dL — ABNORMAL HIGH (ref 70–99)

## 2021-04-22 MED ORDER — POLYETHYLENE GLYCOL 3350 17 G PO PACK: 17.0000 g | PACK | Freq: Every day | ORAL | 0 refills | Status: AC

## 2021-04-22 MED ORDER — LIDOCAINE 5 % EX PTCH
1.0000 | MEDICATED_PATCH | CUTANEOUS | 0 refills | Status: AC
  Filled 2021-04-22: qty 30, 30d supply, fill #0

## 2021-04-22 MED ORDER — TRAMADOL HCL 50 MG PO TABS: 50.0000 mg | ORAL_TABLET | Freq: Four times a day (QID) | ORAL | Status: DC | PRN

## 2021-04-22 MED ORDER — ALBUTEROL SULFATE (2.5 MG/3ML) 0.083% IN NEBU
INHALATION_SOLUTION | Freq: Four times a day (QID) | RESPIRATORY_TRACT | 12 refills | Status: AC | PRN
  Filled 2021-04-22: qty 90, 8d supply, fill #0

## 2021-04-22 MED ORDER — ACETAMINOPHEN 500 MG PO TABS: 1000.0000 mg | ORAL_TABLET | Freq: Three times a day (TID) | ORAL | Status: AC

## 2021-04-22 MED ORDER — CERTAVITE/ANTIOXIDANTS PO TABS
1.0000 | ORAL_TABLET | Freq: Every day | ORAL | 2 refills | Status: DC
  Filled 2021-04-22: qty 30, 30d supply, fill #0

## 2021-04-22 MED ORDER — TRAMADOL HCL 50 MG PO TABS
50.0000 mg | ORAL_TABLET | Freq: Four times a day (QID) | ORAL | 0 refills | Status: AC | PRN
  Filled 2021-04-22: qty 30, 7d supply, fill #0

## 2021-04-22 MED ORDER — DOXYCYCLINE HYCLATE 100 MG PO TABS
100.0000 mg | ORAL_TABLET | Freq: Two times a day (BID) | ORAL | 0 refills | Status: AC
Start: 2021-04-22 — End: 2021-04-24
  Filled 2021-04-22: qty 4, 2d supply, fill #0

## 2021-04-22 MED ORDER — VITAMIN A 3 MG (10000 UNIT) PO CAPS
20000.0000 [IU] | ORAL_CAPSULE | Freq: Every day | ORAL | 0 refills | Status: AC
  Filled 2021-04-22: qty 28, 14d supply, fill #0

## 2021-04-22 MED ORDER — CYANOCOBALAMIN 1000 MCG PO TABS
1000.0000 ug | ORAL_TABLET | Freq: Every day | ORAL | 2 refills | Status: AC
  Filled 2021-04-22: qty 30, 30d supply, fill #0

## 2021-04-22 MED ORDER — SELENIUM 100 MCG PO TABS
100.0000 ug | ORAL_TABLET | Freq: Every day | ORAL | 0 refills | Status: AC
Start: 2021-04-22 — End: 2021-05-06
  Filled 2021-04-22: qty 14, 14d supply, fill #0

## 2021-04-22 MED ORDER — PREDNISONE 10 MG PO TABS
ORAL_TABLET | ORAL | 1 refills | Status: AC
  Filled 2021-04-22: qty 90, 30d supply, fill #0

## 2021-04-22 MED ORDER — ASCORBIC ACID 500 MG PO TABS
500.0000 mg | ORAL_TABLET | Freq: Two times a day (BID) | ORAL | 1 refills | Status: DC
  Filled 2021-04-22: qty 60, 30d supply, fill #0

## 2021-04-22 NOTE — Discharge Summary (Addendum)
PATIENT DETAILS Name: [name] Age: 87 y.o. Sex: female Date of Birth: 05-07-1934 MRN: [id number]. Admitting Physician: Shela Leff, MD EPP:IRJJO, Annie Main, MD  Admit Date: 04/19/2021 Discharge date: 04/22/2021  Recommendations for Outpatient Follow-up:  Follow up with PCP in 1-2 weeks Please obtain CMP/CBC in one week Please ensure follow-up with pulmonology/palliative care-appears to have advanced ILD/COPD.  Admitted From:  Home with home health services  Disposition: Home health/palliative care outpatient   Discharge Condition: fair  CODE STATUS:   Code Status: DNR   Diet recommendation:  Diet Order             Diet - low sodium heart healthy           Diet 2 gram sodium Room service appropriate? No; Fluid consistency: Thin; Fluid restriction: 1200 mL Fluid  Diet effective now                    Brief Summary: Patient is a 87 y.o.  female with history of COPD with chronic hypoxic respiratory failure on 4 L of oxygen at home, ?ILD, VTE on Eliquis, HFpEF-presenting with shortness of breath-felt to have COPD exacerbation.  See below for further details.   Significant events: 1/12-1/18>> Hospitalization for acute on chronic hypoxia due to PE and a mechanical fall resulting in left fifth/sixth and seventh rib fractures.  Discharged to SNF 2/19>> presenting to [facility] for shortness of breath-found to have COPD exacerbation.   Significant imaging studies: 1/16>> TTE: EF 84-16%, grade 1 diastolic dysfunction, RVSP 80.3. 2/19>> CXR: Diffuse interstitial prominence in the lungs bilaterally, stable left rib fractures. 2/19>> x-ray sacrum/coccyx: Compression deformity at L4 and L5 2/19>> CT head/C-spine: No acute intracranial process-no fracture. 2/19>> CT thoracic spine: No traumatic injury-chronic compression deformities involving T3, T7, T8, T10-T12.   Significant microbiology data:     Procedures: None   Consults:  None    Brief Hospital  Course: Acute on chronic hypoxic respiratory failure due to COPD exacerbation and underlying ILD: Much improved-on 4 L at rest-but requires up to 6 L with ambulation.  Treated with bronchodilators/steroids and furosemide.  Recent CT chest-and CXR removed-appears to have significant worsening of parenchymal disease over the past few years-probably developed interstitial lung disease.  She is 87 years old-and likely is not a candidate for aggressive care-DNR in place.  She used to follow with pulmonologist-Dr. Lamonte Sakai until 2019-and subsequently decided not to follow-up with Dr. Lamonte Sakai any longer.  Imaging was reviewed with PCCM MD-Dr. Icard-unfortunately no good options to offer while inpatient-he suggested outpatient follow-up with PCCM.  Subsequently had a long discussion with patient and with son (over the phone)-all understand severity of underlying pulmonary issues and high likelihood that these will continue to worsen.  I have made her an appointment with outpatient pulmonology-Case management will try and set up outpatient palliative care as well.  For now-we will plan on continuing steroids until seen by PCCM.  Continue her usual bronchodilator regimen.  She is already on home oxygen which is being continued.   Chronic HFpEF: She was briefly on IV Lasix-but no evidence of volume overload on exam-she was then transition to oral Lasix.     Cellulitis involving LLE: Mild erythema-but do not see any major concerning findings on exam.  Doppler did show age-indeterminate DVT of popliteal and posterior tibial vein-already on Eliquis.  Recent pulmonary embolism January 2023: Continue Eliquis.    History of mechanical fall with left fifth, sixth and seventh rib fractures in  January 2023: Continue supportive care   Multiple chronic compression fractures: Seen incidentally on CT imaging-stable for outpatient follow-up with PCP-for osteoporosis work-up.   Minimally elevated troponins: No anginal symptoms-doubt  any clinical significance of minimally elevated troponins.     Pulmonary hypertension: Recent echo showed severe pulmonary hypertension-suspect cor pulmonale in the setting of advanced COPD   DM-2 (A1c 5.2 on 2/20): CBGs stable with SSI.  Peripheral neuropathy: Likely related to DM-continue Neurontin.   HLD: Continue statin   Hypothyroidism: Continue Synthroid   Chronic hypotension: Apparently was started on midodrine recently-BP stable-remains on midodrine.   Physical deconditioning/debility: Evaluated by PT-recommendations are for home health services.  Vitamin A, vitamin B12, vitamin C, vitamin B 7, selenium deficiency: This was found during her prior admission-evaluated by dietitian during this hospitalization-these are now being supplemented.  Defer to PCP to recheck these levels as needed.  Chronic pain syndrome: Discussed with patient's son-patient does not tolerate oxycodone well-she has multiple rib fractures and multiple compression deformities.  She has been on scheduled Tylenol and as needed tramadol here-which seems to work well for her as she appears comfortable.  On discharge-scheduled Tylenol and as needed tramadol will be continued-Case management will set up either home hospice/palliative care for further optimization of her regimen.  Nutrition Status: Nutrition Problem: Severe Malnutrition Etiology: chronic illness (COPD, CHF) Signs/Symptoms: severe muscle depletion, severe fat depletion Interventions: Boost Breeze, Prostat, MVI   BMI: Estimated body mass index is 23.24 kg/m as calculated from the following:   Height as of this encounter: 5\' 3"  (1.6 m).   Weight as of this encounter: 59.5 kg.   Discharge Diagnoses:  Principal Problem:   Acute on chronic respiratory failure with hypoxia (HCC) Active Problems:   Dyslipidemia   Low blood pressure   Cellulitis   Hypothyroidism   Diabetes mellitus type 2, noninsulin dependent (HCC)   History of pulmonary  embolism   Physical deconditioning   Elevated troponin   Diabetic neuropathy [facility])   Discharge Instructions:  Activity:  As tolerated with Full fall precautions use walker/cane & assistance as needed  Discharge Instructions     Call MD for:  difficulty breathing, headache or visual disturbances   Complete by: As directed    Diet - low sodium heart healthy   Complete by: As directed    Discharge instructions   Complete by: As directed    Follow with Primary MD  Reynold Bowen, MD in 1-2 weeks  You will get a call from the lung doctors office for a follow-up appointment.  Please get a complete blood count and chemistry panel checked by your Primary MD at your next visit, and again as instructed by your Primary MD.  Get Medicines reviewed and adjusted: Please take all your medications with you for your next visit with your Primary MD  Laboratory/radiological data: Please request your Primary MD to go over all hospital tests and procedure/radiological results at the follow up, please ask your Primary MD to get all Hospital records sent to his/her office.  In some cases, they will be blood work, cultures and biopsy results pending at the time of your discharge. Please request that your primary care M.D. follows up on these results.  Also Note the following: If you experience worsening of your admission symptoms, develop shortness of breath, life threatening emergency, suicidal or homicidal thoughts you must seek medical attention immediately by calling 911 or calling your MD immediately  if symptoms less severe.  You must read complete  instructions/literature along with all the possible adverse reactions/side effects for all the Medicines you take and that have been prescribed to you. Take any new Medicines after you have completely understood and accpet all the possible adverse reactions/side effects.   Do not drive when taking Pain medications or sleeping medications  (Benzodaizepines)  Do not take more than prescribed Pain, Sleep and Anxiety Medications. It is not advisable to combine anxiety,sleep and pain medications without talking with your primary care practitioner  Special Instructions: If you have smoked or chewed Tobacco  in the last 2 yrs please stop smoking, stop any regular Alcohol  and or any Recreational drug use.  Wear Seat belts while driving.  Please note: You were cared for by a hospitalist during your hospital stay. Once you are discharged, your primary care physician will handle any further medical issues. Please note that NO REFILLS for any discharge medications will be authorized once you are discharged, as it is imperative that you return to your primary care physician (or establish a relationship with a primary care physician if you do not have one) for your post hospital discharge needs so that they can reassess your need for medications and monitor your lab values.   Increase activity slowly   Complete by: As directed       Allergies as of 04/22/2021       Reactions   Penicillins Anaphylaxis   Adhesive [tape] Other (See Comments)   Blisters    Morphine    Other reaction(s): Other (See Comments) HALLUCINATIONS   Doxycycline Nausea And Vomiting   Morphine And Related Other (See Comments)   HALLUCINATIONS        Medication List     STOP taking these medications    fluticasone 50 MCG/ACT nasal spray Commonly known as: FLONASE   Fluticasone-Salmeterol 250-50 MCG/DOSE Aepb Commonly known as: ADVAIR   HYDROcodone bit-homatropine 5-1.5 MG/5ML syrup Commonly known as: HYCODAN   oxyCODONE 5 MG immediate release tablet Commonly known as: Oxy IR/ROXICODONE       TAKE these medications    feeding supplement Liqd Take 1 Container by mouth daily. Wild berry   (feeding supplement) PROSource Plus liquid Take 30 mLs by mouth 3 (three) times daily between meals.   acetaminophen 500 MG tablet Commonly known as:  TYLENOL Take 2 tablets (1,000 mg total) by mouth every 8 (eight) hours. What changed:  when to take this reasons to take this   albuterol 108 (90 Base) MCG/ACT inhaler Commonly known as: VENTOLIN HFA Inhale 2 puffs into the lungs every 6 (six) hours as needed for wheezing or shortness of breath.   albuterol 1.25 MG/3ML nebulizer solution Commonly known as: ACCUNEB Take 3 mLs (1.25 mg total) by nebulization every 6 (six) hours as needed for wheezing.   apixaban 5 MG Tabs tablet Commonly known as: ELIQUIS Take 1 tablet (5 mg total) by mouth 2 (two) times daily. What changed: Another medication with the same name was removed. Continue taking this medication, and follow the directions you see here.   ascorbic acid 500 MG tablet Commonly known as: VITAMIN C Take 1 tablet (500 mg total) by mouth 2 (two) times daily.   cyanocobalamin 1000 MCG tablet Take 1 tablet (1,000 mcg total) by mouth daily. Start taking on: April 23, 2021   doxycycline 100 MG tablet Commonly known as: VIBRA-TABS Take 1 tablet (100 mg total) by mouth every 12 (twelve) hours for 2 days.   gabapentin 100 MG capsule Commonly known as:  NEURONTIN Take 100 mg by mouth 2 (two) times daily. 8 am and 2 pm   gabapentin 300 MG capsule Commonly known as: NEURONTIN Take 300 mg by mouth at bedtime.   levothyroxine 100 MCG tablet Commonly known as: SYNTHROID Take 1 tablet (100 mcg total) by mouth daily before breakfast. For hypothyroid What changed: Another medication with the same name was removed. Continue taking this medication, and follow the directions you see here.   lidocaine 5 % Commonly known as: LIDODERM Place 1 patch onto the skin daily. Remove & Discard patch within 12 hours or as directed by MD. Apply to left upper lateral chest wall at site of maximum pain.   LORazepam 0.5 MG tablet Commonly known as: ATIVAN Take 1 tablet (0.5 mg total) by mouth every 8 (eight) hours as needed for anxiety. Anxiety    midodrine 5 MG tablet Commonly known as: PROAMATINE Take 5 mg by mouth 2 (two) times daily.   multivitamin with minerals Tabs tablet Take 1 tablet by mouth daily.   OXYGEN Inhale 4 L into the lungs continuous.   pantoprazole 40 MG tablet Commonly known as: Protonix Take 1 tablet (40 mg total) by mouth daily.   polyethylene glycol 17 g packet Commonly known as: MiraLax Take 17 g by mouth daily.   predniSONE 10 MG tablet Commonly known as: DELTASONE Take 30 mg p.o. daily for 5 days, take 20 mg p.o. daily for 5 days, take 15 mg p.o. daily until seen by your pulmonologist.   Selenium 100 MCG Tabs Take 1 tablet (100 mcg total) by mouth daily for 14 days.   simvastatin 80 MG tablet Commonly known as: ZOCOR Take 80 mg by mouth daily.   traMADol 50 MG tablet Commonly known as: ULTRAM Take 1 tablet (50 mg total) by mouth every 6 (six) hours as needed for severe pain.   Trelegy Ellipta 200-62.5-25 MCG/ACT Aepb Generic drug: Fluticasone-Umeclidin-Vilant Take 1 puff by mouth daily.   vitamin A 3 MG (10000 UNITS) capsule Take 2 capsules (20,000 Units total) by mouth daily for 14 days. Start taking on: April 23, 2021               Durable Medical Equipment  (From admission, onward)           Start     Ordered   04/22/21 [id number]  For home use only DME oxygen  Once       Question Answer Comment  Length of Need 6 Months   Mode or (Route) Nasal cannula   Liters per Minute 6   Frequency Continuous (stationary and portable oxygen unit needed)   Oxygen conserving device Yes   Oxygen delivery system Gas      04/22/21 0950            Follow-up Information     Reynold Bowen, MD. Schedule an appointment as soon as possible for a visit in 1 week(s).   Specialty: Endocrinology Contact information: [place] [address] [phone]         [facility] Follow up on 05/06/2021.   Specialty: Pulmonology Why: appt with Marland Kitchen PA-C at 10 am. Contact information: [address] [place] [address] [place] Follow up.   Specialty: [place] Why: Resumption of home health service will be provided by [facility], start of care within 48 hours post discharge Contact information: [address] [place] [address] [phone]         [facility] Follow up.   Why: Your outpatient palliative care will be provided by [facility] information: [place] [address] [phone]                Allergies  Allergen Reactions   Penicillins Anaphylaxis   Adhesive [Tape] Other (See Comments)    Blisters    Morphine     Other reaction(s): Other (See Comments) HALLUCINATIONS   Doxycycline Nausea And Vomiting   Morphine And Related Other (See Comments)    HALLUCINATIONS     Other Procedures/Studies: DG Chest 2 View  Result Date: 04/19/2021 CLINICAL DATA:  Shortness of breath, fall at home on hard floor. EXAM: CHEST - 2 VIEW COMPARISON:  03/14/2021. FINDINGS: The heart size and mediastinal contours are stable. There is atherosclerotic calcification of the aorta. Prominent interstitial markings are present in the lungs bilaterally, slightly improved from the prior exam. There is a small right pleural effusion. No pneumothorax is seen. Fracture deformities in the ribs on the left are unchanged. Surgical changes are present over the left chest. IMPRESSION: 1. Diffuse interstitial prominence in the lungs bilaterally, suggesting interstitial lung disease, slightly improved from the prior exam. 2. Small right pleural effusion. 3. Stable left rib fractures. Electronically Signed   By: Brett Fairy M.D.   On: 04/19/2021 21:33   DG Sacrum/Coccyx  Result Date: 04/19/2021 CLINICAL DATA:  Fall at home on hard floor. EXAM: SACRUM AND COCCYX - 2+ VIEW COMPARISON:   12/18/2010, 10/30/2012. FINDINGS: There is no evidence of acute sacral fracture. There is a compression deformity at L5 which is indeterminate in age. A compression deformity is noted at L4 with progression from 2014. Fixation hardware is present in the proximal left femur. Degenerative changes are noted at the sacroiliac joints bilaterally. Vascular calcifications are noted in the pelvis. IMPRESSION: 1. Compression deformity at L5 which is new from 2014 and indeterminate in age. 2. Compression deformity at L4 with progression since 2014. 3. No definite evidence of sacral fracture. Electronically Signed   By: Brett Fairy M.D.   On: 04/19/2021 21:39   CT HEAD WO CONTRAST (5MM)  Result Date: 04/19/2021 CLINICAL DATA:  Fall, head and neck trauma. EXAM: CT HEAD WITHOUT CONTRAST CT CERVICAL SPINE WITHOUT CONTRAST TECHNIQUE: Multidetector CT imaging of the head and cervical spine was performed following the standard protocol without intravenous contrast. Multiplanar CT image reconstructions of the cervical spine were also generated. RADIATION DOSE REDUCTION: This exam was performed according to the departmental dose-optimization program which includes automated exposure control, adjustment of the mA and/or kV according to patient size and/or use of iterative reconstruction technique. COMPARISON:  03/12/2021. FINDINGS: CT HEAD FINDINGS Brain: No acute intracranial hemorrhage, midline shift or mass effect. No extra-axial fluid collection. Diffuse atrophy is noted. Subcortical and periventricular white matter hypodensities are present bilaterally. No hydrocephalus. Vascular: No hyperdense vessel or unexpected calcification. Skull: Normal. Negative for fracture or focal lesion. Sinuses/Orbits: Mucosal thickening is present in the maxillary sinuses and frontal sinus on the right. There is debris in the left sphenoid sinus. No acute orbital abnormality. Other: None. CT CERVICAL SPINE FINDINGS Alignment: There is mild  anterolisthesis at C7-T1. Skull base and vertebrae: There is a compression deformity in the superior endplate at F09 which was likely present on the prior exam. No acute cervical spine fracture is noted. Soft tissues and spinal canal: No prevertebral fluid or  swelling. No visible canal hematoma. Disc levels: Multilevel intervertebral disc space narrowing, uncovertebral osteophyte formation and facet arthropathy is noted. Upper chest: Interstitial prominence in cystic spaces are present at the lung apices bilaterally. There is atherosclerotic calcification of the aorta. Other: None. IMPRESSION: 1. No acute intracranial process. 2. Atrophy with chronic microvascular ischemic changes. 3. No acute fracture in the cervical spine. 4. Compression deformity in the superior endplate of T3 which is unchanged from the prior exam. 5. Findings suggestive interstitial lung disease and/or emphysema at the lung apices. Electronically Signed   By: Brett Fairy M.D.   On: 04/19/2021 23:03   CT Cervical Spine Wo Contrast  Result Date: 04/19/2021 CLINICAL DATA:  Fall, head and neck trauma. EXAM: CT HEAD WITHOUT CONTRAST CT CERVICAL SPINE WITHOUT CONTRAST TECHNIQUE: Multidetector CT imaging of the head and cervical spine was performed following the standard protocol without intravenous contrast. Multiplanar CT image reconstructions of the cervical spine were also generated. RADIATION DOSE REDUCTION: This exam was performed according to the departmental dose-optimization program which includes automated exposure control, adjustment of the mA and/or kV according to patient size and/or use of iterative reconstruction technique. COMPARISON:  03/12/2021. FINDINGS: CT HEAD FINDINGS Brain: No acute intracranial hemorrhage, midline shift or mass effect. No extra-axial fluid collection. Diffuse atrophy is noted. Subcortical and periventricular white matter hypodensities are present bilaterally. No hydrocephalus. Vascular: No hyperdense  vessel or unexpected calcification. Skull: Normal. Negative for fracture or focal lesion. Sinuses/Orbits: Mucosal thickening is present in the maxillary sinuses and frontal sinus on the right. There is debris in the left sphenoid sinus. No acute orbital abnormality. Other: None. CT CERVICAL SPINE FINDINGS Alignment: There is mild anterolisthesis at C7-T1. Skull base and vertebrae: There is a compression deformity in the superior endplate at T65 which was likely present on the prior exam. No acute cervical spine fracture is noted. Soft tissues and spinal canal: No prevertebral fluid or swelling. No visible canal hematoma. Disc levels: Multilevel intervertebral disc space narrowing, uncovertebral osteophyte formation and facet arthropathy is noted. Upper chest: Interstitial prominence in cystic spaces are present at the lung apices bilaterally. There is atherosclerotic calcification of the aorta. Other: None. IMPRESSION: 1. No acute intracranial process. 2. Atrophy with chronic microvascular ischemic changes. 3. No acute fracture in the cervical spine. 4. Compression deformity in the superior endplate of T3 which is unchanged from the prior exam. 5. Findings suggestive interstitial lung disease and/or emphysema at the lung apices. Electronically Signed   By: Brett Fairy M.D.   On: 04/19/2021 23:03   CT Thoracic Spine Wo Contrast  Result Date: 04/20/2021 CLINICAL DATA:  Initial evaluation for acute trauma, fall. EXAM: CT THORACIC SPINE WITHOUT CONTRAST TECHNIQUE: Multidetector CT images of the thoracic were obtained using the standard protocol without intravenous contrast. RADIATION DOSE REDUCTION: This exam was performed according to the departmental dose-optimization program which includes automated exposure control, adjustment of the mA and/or kV according to patient size and/or use of iterative reconstruction technique. COMPARISON:  Comparison made with prior CT from 03/12/2021 and MRI from 05/04/2016.  FINDINGS: Alignment: Sigmoid scoliotic curvature of the thoracic spine with exaggeration of the normal thoracic kyphosis. No listhesis. Vertebrae: Compression deformities involving the T3, T7, and T8 vertebral bodies, grossly stable from previous, consistent with chronic fractures. Additional compression fractures involving the T10, T11, and T12 vertebral bodies also favored to be chronic in nature. No visible acute vertebral body fracture. Subacute fractures involving the left posterolateral fourth, fifth, and sixth ribs noted. Few  additional chronic remotely healed rib fractures noted. No discrete or worrisome osseous lesions. Paraspinal and other soft tissues: Paraspinous soft tissues demonstrate no acute finding. Small layering bilateral pleural effusions. Moderate aortic atherosclerosis. Moderate hiatal hernia. Severe emphysematous changes with associated bronchiectasis noted within the visualized lungs. Surgical clips present within the left axillary region. Disc levels: Mild multilevel disc desiccation seen throughout the mid and lower thoracic spine. Mild scattered facet arthrosis. No significant spinal stenosis. IMPRESSION: 1. No acute traumatic injury within the thoracic spine. 2. Chronic compression deformities involving the T3, T7, T8, and T10 through T12 vertebral bodies. 3. Subacute fractures involving the left posterolateral fourth, fifth, and sixth ribs. 4. Small layering bilateral pleural effusions. 5. Moderate hiatal hernia. 6. Aortic Atherosclerosis (ICD10-I70.0) and Emphysema (ICD10-J43.9). Electronically Signed   By: Jeannine Boga M.D.   On: 04/20/2021 00:04   CT Lumbar Spine Wo Contrast  Result Date: 04/19/2021 CLINICAL DATA:  Initial evaluation for acute trauma, fall. EXAM: CT LUMBAR SPINE WITHOUT CONTRAST TECHNIQUE: Multidetector CT imaging of the lumbar spine was performed without intravenous contrast administration. Multiplanar CT image reconstructions were also generated.  RADIATION DOSE REDUCTION: This exam was performed according to the departmental dose-optimization program which includes automated exposure control, adjustment of the mA and/or kV according to patient size and/or use of iterative reconstruction technique. COMPARISON:  Prior MRI from 10/31/2012. FINDINGS: Segmentation: Standard. Lowest well-formed disc space labeled the L5-S1 level. Alignment: 3 mm facet mediated anterolisthesis of L4 on L5. Alignment otherwise normal with preservation of the normal lumbar lordosis. Vertebrae: Chronic compression deformities involving the L2 and L3 vertebral bodies with sequelae of prior vertebral augmentation. Additional chronic compression deformities involving the L1, L4, and L5 vertebral bodies noted as well. No acute fracture. Visualized sacrum and pelvis intact. No worrisome osseous lesions. Paraspinal and other soft tissues: Paraspinous soft tissues demonstrate no acute finding. Asymmetric atrophy noted involving the left psoas muscle. Advanced aorto bi-iliac atherosclerotic disease. Disc levels: L1-2: Mild disc bulge up to 5 mm bony retropulsion related to the chronic L2 compression fracture. Mild bilateral facet hypertrophy. Resultant mild spinal stenosis. Foramina remain patent. L2-3: Mild diffuse disc bulge. Up to 3 mm bony retropulsion related to the chronic L3 compression fracture. Moderate bilateral facet hypertrophy. Resultant moderate canal with right worse than left lateral recess stenosis. Foramina remain patent. L3-4: Mild disc bulge. Superimposed small right extraforaminal disc protrusion closely approximates the exiting right L3 nerve root (series 5, image 49). Moderate to advanced bilateral facet arthrosis. Resultant moderate canal with bilateral subarticular stenosis. Mild bilateral L3 foraminal narrowing. L4-5: Trace anterolisthesis. Mild disc bulge with disc desiccation. Severe bilateral facet arthrosis. Prior right hemi laminectomy. No significant spinal  stenosis. Moderate bilateral L4 foraminal stenosis. L5-S1: Minimal disc bulge with reactive endplate change. Moderate right worse than left facet hypertrophy. No spinal stenosis. Foramina remain patent. IMPRESSION: 1. No acute traumatic injury within the lumbar spine. 2. Chronic compression deformities involving the L1 through L5 vertebral bodies with sequelae of prior vertebral augmentation at L2 and L3. 3. Underlying moderate multilevel degenerative spondylosis and facet arthrosis, with resultant moderate spinal stenosis at L2-3 and L3-4. Moderate bilateral L4 foraminal narrowing. 4. Aortic Atherosclerosis (ICD10-I70.0). Electronically Signed   By: Jeannine Boga M.D.   On: 04/19/2021 23:50   VAS [place] LOWER EXTREMITY VENOUS (DVT)  Result Date: 04/21/2021  Lower Venous DVT Study Patient Name:  JAMESYN MOOREFIELD  Date of Exam:   04/21/2021 Medical Rec #: [id number]  Accession #:    [id number] Date of Birth: 12-07-1934          Patient Gender: F Patient Age:   37 years Exam Location:  [facility] Procedure:      VAS [place] LOWER EXTREMITY VENOUS (DVT) Referring Phys: Vance Gather --------------------------------------------------------------------------------  Indications: Elevated d-dimer, history of PE.  Limitations: Poor ultrasound/tissue interface secondary to overlying edema. Comparison Study: 03-16-2021 Prior bilateral lower extremity venous was negative                   for DVT. Performing Technologist: Darlin Coco RDMS, RVT  Examination Guidelines: A complete evaluation includes B-mode imaging, spectral Doppler, color Doppler, and power Doppler as needed of all accessible portions of each vessel. Bilateral testing is considered an integral part of a complete examination. Limited examinations for reoccurring indications may be performed as noted. The reflux portion of the exam is performed with the patient in reverse Trendelenburg.   +---------+---------------+---------+-----------+----------+--------------+  RIGHT     Compressibility Phasicity Spontaneity Properties Thrombus Aging  +---------+---------------+---------+-----------+----------+--------------+  CFV       Full            Yes       Yes                                    +---------+---------------+---------+-----------+----------+--------------+  SFJ       Full                                                             +---------+---------------+---------+-----------+----------+--------------+  FV Prox   Full                                                             +---------+---------------+---------+-----------+----------+--------------+  FV Mid    Full                                                             +---------+---------------+---------+-----------+----------+--------------+  FV Distal Full                                                             +---------+---------------+---------+-----------+----------+--------------+  PFV       Full                                                             +---------+---------------+---------+-----------+----------+--------------+  POP       Full  Yes       Yes                                    +---------+---------------+---------+-----------+----------+--------------+  PTV       Full                                                             +---------+---------------+---------+-----------+----------+--------------+  PERO      Full                                                             +---------+---------------+---------+-----------+----------+--------------+  Gastroc   Full                                                             +---------+---------------+---------+-----------+----------+--------------+   +---------+---------------+---------+-----------+----------+-----------------+  LEFT      Compressibility Phasicity Spontaneity Properties Thrombus Aging      +---------+---------------+---------+-----------+----------+-----------------+  CFV       Full            Yes       Yes                                       +---------+---------------+---------+-----------+----------+-----------------+  SFJ       Full                                                                +---------+---------------+---------+-----------+----------+-----------------+  FV Prox   Full                                                                +---------+---------------+---------+-----------+----------+-----------------+  FV Mid    Full                                                                +---------+---------------+---------+-----------+----------+-----------------+  FV Distal Full                                                                +---------+---------------+---------+-----------+----------+-----------------+  PFV       Full                                                                +---------+---------------+---------+-----------+----------+-----------------+  POP       Partial         Yes       Yes                    Age Indeterminate  +---------+---------------+---------+-----------+----------+-----------------+  PTV       None                                             Age Indeterminate  +---------+---------------+---------+-----------+----------+-----------------+  PERO      Full                                                                +---------+---------------+---------+-----------+----------+-----------------+  Gastroc   Full                                                                +---------+---------------+---------+-----------+----------+-----------------+    Summary: RIGHT: - There is no evidence of deep vein thrombosis in the lower extremity.  - No cystic structure found in the popliteal fossa.  LEFT: - Findings consistent with partial, age indeterminate deep vein thrombosis involving the distal segment of the left popliteal vein, and age  indeterminate thrombosis of a single left posterior tibial vein.  - No cystic structure found in the popliteal fossa.  *See table(s) above for measurements and observations.    Preliminary      TODAY-DAY OF DISCHARGE:  Subjective:   [facility] today has no headache,no chest abdominal pain,no new weakness tingling or numbness, feels much better wants to go home today.  Objective:   Blood pressure 140/90, pulse 82, temperature 97.9 F (36.6 C), temperature source Oral, resp. rate 19, height 5\' 3"  (1.6 m), weight 59.5 kg, SpO2 99 %. No intake or output data in the 24 hours ending 04/22/21 1124 Filed Weights   04/19/21 2027 04/20/21 0219  Weight: 54.9 kg 59.5 kg    Exam: Awake Alert, Oriented *3, No new F.N deficits, Normal affect [place].AT,PERRAL Supple Neck,No JVD, No cervical lymphadenopathy appriciated.  Symmetrical Chest wall movement, Good air movement bilaterally, CTAB RRR,No Gallops,Rubs or new Murmurs, No Parasternal Heave +ve B.Sounds, Abd Soft, Non tender, No organomegaly appriciated, No rebound -guarding or rigidity. No Cyanosis, Clubbing or edema, No new Rash or bruise   PERTINENT RADIOLOGIC STUDIES: VAS [place] LOWER EXTREMITY VENOUS (DVT)  Result Date: 04/21/2021  Lower Venous DVT Study Patient Name:  LIBNI FUSARO  Date of Exam:   04/21/2021 Medical Rec #: [id number]  Accession #:    [id number] Date of Birth: 25-Jun-1934          Patient Gender: F Patient Age:   57 years Exam Location:  [facility] Procedure:      VAS [place] LOWER EXTREMITY VENOUS (DVT) Referring Phys: Vance Gather --------------------------------------------------------------------------------  Indications: Elevated d-dimer, history of PE.  Limitations: Poor ultrasound/tissue interface secondary to overlying edema. Comparison Study: 03-16-2021 Prior bilateral lower extremity venous was negative                   for DVT. Performing Technologist: Darlin Coco RDMS, RVT  Examination Guidelines: A  complete evaluation includes B-mode imaging, spectral Doppler, color Doppler, and power Doppler as needed of all accessible portions of each vessel. Bilateral testing is considered an integral part of a complete examination. Limited examinations for reoccurring indications may be performed as noted. The reflux portion of the exam is performed with the patient in reverse Trendelenburg.  +---------+---------------+---------+-----------+----------+--------------+  RIGHT     Compressibility Phasicity Spontaneity Properties Thrombus Aging  +---------+---------------+---------+-----------+----------+--------------+  CFV       Full            Yes       Yes                                    +---------+---------------+---------+-----------+----------+--------------+  SFJ       Full                                                             +---------+---------------+---------+-----------+----------+--------------+  FV Prox   Full                                                             +---------+---------------+---------+-----------+----------+--------------+  FV Mid    Full                                                             +---------+---------------+---------+-----------+----------+--------------+  FV Distal Full                                                             +---------+---------------+---------+-----------+----------+--------------+  PFV       Full                                                             +---------+---------------+---------+-----------+----------+--------------+  POP       Full  Yes       Yes                                    +---------+---------------+---------+-----------+----------+--------------+  PTV       Full                                                             +---------+---------------+---------+-----------+----------+--------------+  PERO      Full                                                              +---------+---------------+---------+-----------+----------+--------------+  Gastroc   Full                                                             +---------+---------------+---------+-----------+----------+--------------+   +---------+---------------+---------+-----------+----------+-----------------+  LEFT      Compressibility Phasicity Spontaneity Properties Thrombus Aging     +---------+---------------+---------+-----------+----------+-----------------+  CFV       Full            Yes       Yes                                       +---------+---------------+---------+-----------+----------+-----------------+  SFJ       Full                                                                +---------+---------------+---------+-----------+----------+-----------------+  FV Prox   Full                                                                +---------+---------------+---------+-----------+----------+-----------------+  FV Mid    Full                                                                +---------+---------------+---------+-----------+----------+-----------------+  FV Distal Full                                                                +---------+---------------+---------+-----------+----------+-----------------+  PFV       Full                                                                +---------+---------------+---------+-----------+----------+-----------------+  POP       Partial         Yes       Yes                    Age Indeterminate  +---------+---------------+---------+-----------+----------+-----------------+  PTV       None                                             Age Indeterminate  +---------+---------------+---------+-----------+----------+-----------------+  PERO      Full                                                                +---------+---------------+---------+-----------+----------+-----------------+  Gastroc   Full                                                                 +---------+---------------+---------+-----------+----------+-----------------+    Summary: RIGHT: - There is no evidence of deep vein thrombosis in the lower extremity.  - No cystic structure found in the popliteal fossa.  LEFT: - Findings consistent with partial, age indeterminate deep vein thrombosis involving the distal segment of the left popliteal vein, and age indeterminate thrombosis of a single left posterior tibial vein.  - No cystic structure found in the popliteal fossa.  *See table(s) above for measurements and observations.    Preliminary      PERTINENT LAB RESULTS: CBC: Recent Labs    04/19/21 2101 04/21/21 0340  WBC 9.9 13.5*  HGB 13.4 11.6*  HCT 44.2 36.5  PLT 455* 391   CMET CMP     Component Value Date/Time   NA 137 04/21/2021 0340   NA 143 08/01/2013 1057   K 3.4 (L) 04/21/2021 0340   K 3.3 (L) 08/01/2013 1057   CL 99 04/21/2021 0340   CL 102 07/04/2012 1456   CO2 27 04/21/2021 0340   CO2 19 (L) 08/01/2013 1057   GLUCOSE 138 (H) 04/21/2021 0340   GLUCOSE 106 08/01/2013 1057   GLUCOSE 92 07/04/2012 1456   BUN 20 04/21/2021 0340   BUN 14.7 08/01/2013 1057   CREATININE 0.79 04/21/2021 0340   CREATININE 0.8 08/01/2013 1057   CALCIUM 8.2 (L) 04/21/2021 0340   CALCIUM 9.1 08/01/2013 1057   PROT 7.0 04/19/2021 2101   PROT 6.9 08/01/2013 1057   ALBUMIN 2.9 (L) 04/19/2021 2101   ALBUMIN 2.9 (L) 08/01/2013 1057   AST 34 04/19/2021 2101   AST 12 08/01/2013 1057   ALT 15 04/19/2021 2101   ALT  9 08/01/2013 1057   ALKPHOS 84 04/19/2021 2101   ALKPHOS 111 08/01/2013 1057   BILITOT 1.2 04/19/2021 2101   BILITOT 0.34 08/01/2013 1057   GFRNONAA >60 04/21/2021 0340   GFRAA 75 (L) 04/17/2014 0445    GFR Estimated Creatinine Clearance: 42.5 mL/min (by C-G formula based on SCr of 0.79 mg/dL). No results for input(s): LIPASE, AMYLASE in the last 72 hours. No results for input(s): CKTOTAL, CKMB, CKMBINDEX, TROPONINI in the last 72 hours. Invalid  input(s): POCBNP Recent Labs    04/20/21 0147  DDIMER 3.47*   Recent Labs    04/20/21 0148  HGBA1C 5.2   No results for input(s): CHOL, HDL, LDLCALC, TRIG, CHOLHDL, LDLDIRECT in the last 72 hours. No results for input(s): TSH, T4TOTAL, T3FREE, THYROIDAB in the last 72 hours.  Invalid input(s): FREET3 No results for input(s): VITAMINB12, FOLATE, FERRITIN, TIBC, IRON, RETICCTPCT in the last 72 hours. Coags: No results for input(s): INR in the last 72 hours.  Invalid input(s): PT Microbiology: Recent Results (from the past 240 hour(s))  Resp Panel by RT-PCR (Flu A&B, Covid) Nasopharyngeal Swab     Status: None   Collection Time: 04/19/21  8:54 PM   Specimen: Nasopharyngeal Swab; Nasopharyngeal(NP) swabs in vial transport medium  Result Value Ref Range Status   SARS Coronavirus 2 by RT PCR NEGATIVE NEGATIVE Final    Comment: (NOTE) SARS-CoV-2 target nucleic acids are NOT DETECTED.  The SARS-CoV-2 RNA is generally detectable in upper respiratory specimens during the acute phase of infection. The lowest concentration of SARS-CoV-2 viral copies this assay can detect is 138 copies/mL. A negative result does not preclude SARS-Cov-2 infection and should not be used as the sole basis for treatment or other patient management decisions. A negative result may occur with  improper specimen collection/handling, submission of specimen other than nasopharyngeal swab, presence of viral mutation(s) within the areas targeted by this assay, and inadequate number of viral copies(<138 copies/mL). A negative result must be combined with clinical observations, patient history, and epidemiological information. The expected result is Negative.  Fact Sheet for Patients:  EntrepreneurPulse.com.au  Fact Sheet for Healthcare Providers:  IncredibleEmployment.be  This test is no t yet approved or cleared by the [place] FDA and  has been authorized for  detection and/or diagnosis of SARS-CoV-2 by FDA under an Emergency Use Authorization (EUA). This EUA will remain  in effect (meaning this test can be used) for the duration of the COVID-19 declaration under Section 564(b)(1) of the Act, 21 U.S.C.section 360bbb-3(b)(1), unless the authorization is terminated  or revoked sooner.       Influenza A by PCR NEGATIVE NEGATIVE Final   Influenza B by PCR NEGATIVE NEGATIVE Final    Comment: (NOTE) The Xpert Xpress SARS-CoV-2/FLU/RSV plus assay is intended as an aid in the diagnosis of influenza from Nasopharyngeal swab specimens and should not be used as a sole basis for treatment. Nasal washings and aspirates are unacceptable for Xpert Xpress SARS-CoV-2/FLU/RSV testing.  Fact Sheet for Patients: EntrepreneurPulse.com.au  Fact Sheet for Healthcare Providers: IncredibleEmployment.be  This test is not yet approved or cleared by the [place] FDA and has been authorized for detection and/or diagnosis of SARS-CoV-2 by FDA under an Emergency Use Authorization (EUA). This EUA will remain in effect (meaning this test can be used) for the duration of the COVID-19 declaration under Section 564(b)(1) of the Act, 21 U.S.C. section 360bbb-3(b)(1), unless the authorization is terminated or revoked.  Performed at [facility], [place]  [address] [place], [place] [address]     FURTHER DISCHARGE INSTRUCTIONS:  Get Medicines reviewed and adjusted: Please take all your medications with you for your next visit with your Primary MD  Laboratory/radiological data: Please request your Primary MD to go over all hospital tests and procedure/radiological results at the follow up, please ask your Primary MD to get all Hospital records sent to his/her office.  In some cases, they will be blood work, cultures and biopsy results pending at the time of your discharge. Please request that your primary care M.D. goes  through all the records of your hospital data and follows up on these results.  Also Note the following: If you experience worsening of your admission symptoms, develop shortness of breath, life threatening emergency, suicidal or homicidal thoughts you must seek medical attention immediately by calling 911 or calling your MD immediately  if symptoms less severe.  You must read complete instructions/literature along with all the possible adverse reactions/side effects for all the Medicines you take and that have been prescribed to you. Take any new Medicines after you have completely understood and accpet all the possible adverse reactions/side effects.   Do not drive when taking Pain medications or sleeping medications (Benzodaizepines)  Do not take more than prescribed Pain, Sleep and Anxiety Medications. It is not advisable to combine anxiety,sleep and pain medications without talking with your primary care practitioner  Special Instructions: If you have smoked or chewed Tobacco  in the last 2 yrs please stop smoking, stop any regular Alcohol  and or any Recreational drug use.  Wear Seat belts while driving.  Please note: You were cared for by a hospitalist during your hospital stay. Once you are discharged, your primary care physician will handle any further medical issues. Please note that NO REFILLS for any discharge medications will be authorized once you are discharged, as it is imperative that you return to your primary care physician (or establish a relationship with a primary care physician if you do not have one) for your post hospital discharge needs so that they can reassess your need for medications and monitor your lab values.  Total Time spent coordinating discharge including counseling, education and face to face time equals greater than 30 minutes.  SignedOren Binet 04/22/2021 11:24 AM

## 2021-04-22 NOTE — TOC Initial Note (Signed)
Transition of Care [facility]) - Initial/Assessment Note    Patient Details  Name: [name] MRN: [id number] Date of Birth: 03-04-1934  Transition of Care [facility]) CM/SW Contact:    Sharin Mons, RN Phone Number: 04/22/2021, 10:28 AM  Clinical Narrative:                 NCM spoke with pt @ bedside regarding d/c planning. Pt requested NCM call son and add him to conversation. Son called by [facility] and placed on speaker phone. Pt states son resides with her. States PTA active with [facility]  and would like to continue @ d/c.Pt agreeable to outpatient palliative  services. Per son already active with [employer].Marland KitchenMarland KitchenNCM secured [facility] liaison to confirm . Pt home oxygen dependent, active with [facility]. Order noted for  increased liter flow, NCM called Aahley with [facility] and made him aware. Instructions given to pt/son  to call [facility] @ [phone], option1 ( Stephanie/ processor) to alert them of d/c from hospital.  [facility] team monitoring and will continue assisting with TOC needs....  Expected Discharge Plan: [place] Barriers to Discharge: Continued Medical Work up   Patient Goals and CMS Choice     Choice offered to / list presented to : Patient, Adult Children  Expected Discharge Plan and Services Expected Discharge Plan: [place] In-house Referral: Hospice / [place] ([facility]/ Out pat ient palliative services) Discharge Planning Services: CM Consult   Living arrangements for the past 2 months: Single Family Home                           HH Arranged: RN, PT, Nurse's Aide, Social Work, OT [place] Agency: [place] Date [place]: 04/22/21 Time HH Agency Contacted: 1010 Representative spoke with at [place] Arrangements/Services Living arrangements for the past 2 months: [place] with:: Adult Children Patient language and need for  interpreter reviewed:: Yes Do you feel safe going back to the place where you live?: Yes      Need for Family Participation in Patient Care: Yes (Comment) Care giver support system in place?: Yes (comment)   Criminal Activity/Legal Involvement Pertinent to Current Situation/Hospitalization: No - Comment as needed  Activities of Daily Living      Permission Sought/Granted   Permission granted to share information with : Yes, Verbal Permission Granted  Share Information with NAME: Mairi Stagliano (Son) [phone]           Emotional Assessment Appearance:: Appears stated age Attitude/Demeanor/Rapport: Engaged Affect (typically observed): Accepting Orientation: : Oriented to Self, Oriented to Place, Oriented to  Time, Oriented to Situation Alcohol / Substance Use: Not Applicable Psych Involvement: No (comment)  Admission diagnosis:  Acute on chronic respiratory failure with hypoxia (HCC) [J96.21] Patient Active Problem List   Diagnosis Date Noted   History of pulmonary embolism 04/20/2021   Physical deconditioning 04/20/2021   Elevated troponin 04/20/2021   Diabetic neuropathy ([place]) 04/20/2021   Acute on chronic respiratory failure with hypoxia ([place]) 03/13/2021   Rib fractures 03/13/2021   COPD, severe ([place]) 03/13/2021   Diabetes mellitus type 2, noninsulin dependent ([place]) 03/13/2021   Pulmonary embolism ([place]) 03/13/2021   Essential hypertension 03/13/2021   Sepsis ([place]) 04/10/2014   History of DVT (deep vein thrombosis)    Blood poisoning    Chronic venous insufficiency 02/15/2014   Paroxysmal SVT (supraventricular tachycardia) (  [place]) 09/11/2013   Heart palpitations 08/22/2013   H. pylori infection 05/04/2013   DVT, LLE 04/18/2013   Anxiety and depression 04/18/2013   Hypothyroidism 04/18/2013   Cellulitis 04/10/2013   UTI (urinary tract infection) 04/10/2013   Hyponatremia 04/10/2013   Hypokalemia 04/10/2013   Leg edema, left 04/10/2013   Nausea with vomiting  04/10/2013   Adult failure to thrive 04/10/2013   Protein-calorie malnutrition, severe ([place]) 04/10/2013   Renal insufficiency 03/10/2013   GI bleed 03/10/2013   UGIB (upper gastrointestinal bleed) 03/10/2013   Breast cancer of upper-outer quadrant of right female breast ([place]) 01/04/2013   Neoplasm of left breast, primary tumor staging category Tis: ductal carcinoma in situ (DCIS) 01/04/2013   Vertebral compression fracture ([place]) 10/30/2012   Adrenal insufficiency ([place]) 10/30/2012   Osteoporosis 10/30/2012   Irritable bowel syndrome 10/30/2012   Peripheral neuropathy 10/30/2012   Unsteady gait 10/30/2012   Recurrent falls 10/30/2012   Chronic cough 07/18/2012   Snoring 06/14/2012   Thin skin    Ecchymosis    Seasonal allergies    Frequency of urination    Nocturia    Anemia    Seizures (HCC)    Unspecified hereditary and idiopathic peripheral neuropathy    Acute blood loss anemia 01/02/2011   Low blood pressure 01/02/2011   Low serum cortisol level 01/02/2011   Dyslipidemia 01/23/2010   Obstructive sleep apnea 01/23/2010   MASS, LUNG 01/23/2010   PCP:  Reynold Bowen, MD Pharmacy:   [facility] [place], [address] AT [facility] [address] [place] [address] Phone: [phone] Fax: [phone]     Social Determinants of Health (SDOH) Interventions    Readmission Risk Interventions No flowsheet data found.

## 2021-04-22 NOTE — TOC Transition Note (Signed)
Transition of Care [facility]) - CM/SW Discharge Note   Patient Details  Name: [name] MRN: [id number] Date of Birth: Oct 10, 1934  Transition of Care [facility]) CM/SW Contact:  Sharin Mons, RN Phone Number: 04/22/2021, 2:52 PM   Clinical Narrative:    Patient will DC to: home Anticipated DC date: 04/21/2021 Family notified: yes Transport by: car   Per MD patient ready for DC today. RN, patient, patient's son, and [facility] notified of DC. Son to bring portable oxygen tank for transport. Pt without Rx med concerns. Post hospital f/u noted on AVS. [facility] to f/u with pt regarding outpatient palliative care. Son to provide transportation to home. RNCM will sign off for now as intervention is no longer needed. Please consult [place] again if new needs arise.   Final next level of care: Home w Home Health Services Barriers to Discharge: No Barriers Identified   Patient Goals and CMS Choice     Choice offered to / list presented to : Patient, Adult Children  Discharge Placement                       Discharge Plan and Services In-house Referral: Hospice / [place] ([facility]/ Out pat ient palliative services) Discharge Planning Services: CM Consult                      HH Arranged: RN, PT, Nurse's Aide, Social Work, OT [place] Agency: [place] Date [facility] Agency Contacted: 04/22/21 Time HH Agency Contacted: 1010 Representative spoke with at [place]: [place] ([place]) Interventions     Readmission Risk Interventions No flowsheet data found.

## 2021-04-22 NOTE — Progress Notes (Signed)
Mobility Specialist Progress Note    04/22/21 1249  Mobility  Bed Position Chair  Activity Ambulated with assistance in hallway  Level of Assistance Contact guard assist, steadying assist  Assistive Device Front wheel walker  Distance Ambulated (ft) 160 ft  Activity Response Tolerated fair  $Mobility charge 1 Mobility   During Mobility: 129 HR Post-Mobility: 85 HR, 133/75 BP, 94% SpO2  Pt received in bed and agreeable. Ambulated on 6LO2 (tank has does not give 5L). Took x1 seated rest break at halfway point. Upon return went to [facility] for void then washed hands and brushed teeth. C/o being dizzy once in chair and took BP and read 70/58, RN notified. After a few minutes pt stated dizziness going away and new BP read 133/75. Left in chair with call bell in reach and son present in room.   [facility] Mobility Specialist  M.S. 5N: [phone]

## 2021-04-22 NOTE — Discharge Instructions (Signed)
Suggestions For Increasing Calories And Protein °Several small meals a day are easier to eat and digest than three large ones. Space meals about 2 to 3 hours apart to maximize comfort. °Stop eating 2 to 3 hours before bed and sleep with your head elevated if gastric reflux (GERD) and heartburn are problems. °Do not eat your favorite foods if you are feeling bad. Save them for when you feel good! °Eat breakfast-type foods at any meal. Eggs are usually easy to eat and are great any time of the day. (The same goes for pancakes and waffles.) °Eat when you feel hungry. Most people have the greatest appetite in the morning because they have not eaten all night. If this is the best meal for you, then pile on those calories and other nutrients in the morning and at lunch. Then you can have a smaller dinner without losing total calories for the day. °Eat leftovers or nutritious snacks in the afternoon and early evening to round out your day. °Try homemade or commercially prepared nutrition bars and puddings, as well as calorie- and protein-rich liquid nutritional supplements. °Benefits of Physical Activity °Talk to your doctor about physical activity. Light or moderate physical activity can help maintain muscle and promote an appetite. Walking in the neighborhood or the local mall is a great way to get up, get out, and get moving. If you are unsteady on your feet, try walking around the dining room table. °Save Room for Calorie-Rich Food! °Drink most fluids between meals instead of with meals. (It is fine to have a sip to help swallow food at meal time.) Fluids (which usually have fewer calories and nutrients than solid food) can take up valuable space in your stomach. ° °Foods Recommended °High-Protein Foods °Milk products Add cheese to toast, crackers, sandwiches, baked potatoes, vegetables, soups, noodles, meat, and fruit. Use reduced-fat (2%) or whole milk in place of water when cooking cereal and cream soups. Include  cream sauces on vegetables and pasta. Add powdered milk to cream soups and mashed potatoes.  °Eggs Have hard-cooked eggs readily available in the refrigerator. Chop and add to salads, casseroles, soups, and vegetables. Make a quick egg salad. All eggs should be well cooked to avoid the risk of harmful bacteria.  °Meats, poultry, and fish Add leftover cooked meats to soups, casseroles, salads, and omelets. Make dip by mixing diced, chopped, or shredded meat with sour cream and spices.  °Beans, legumes, nuts, and seeds Sprinkle nuts and seeds on cereals, fruit, and desserts such as ice cream, pudding, and custard. Also serve nuts and seeds on vegetables, salads, and pasta. Spread peanut butter on toast, bread, English muffins, and fruit, or blend it in a milk shake. Add beans and peas to salads, soups, casseroles, and vegetable dishes.  °High-Calorie Foods °Butter, margarine, and  oils Melt butter or margarine over potatoes, rice, pasta, and cooked vegetables. Add melted butter or margarine into soups and casseroles and spread on bread for sandwiches before spreading sandwich spread or peanut butter. Sauté or stir-fry vegetables, meats, chicken and fish such as shrimp/scallops in olive or canola oil. A variety of oils add calories and can be used to marinate meat, chicken, or fish.  °Milk products Add whipping cream to desserts, pancakes, waffles, fruit, and hot chocolate, and fold it into soups and casseroles. Add sour cream to baked potatoes and vegetables.  °Salad dressing Use regular (not low-fat or diet) mayonnaise and salad dressing on sandwiches and in dips with vegetables and fruit.   °  Sweets Add jelly and honey to bread and crackers. Add jam to fruit and ice cream and as a topping over cake.   Copyright 2020  Academy of [employer]. All rights reserved.   [facility] Stay Proper nutrition can help your body recover from illness and injury.   Foods and beverages high in protein,  vitamins, and minerals help rebuild muscle loss, promote healing, & reduce fall risk.   In addition to eating healthy foods, a nutrition shake is an easy, delicious way to get the nutrition you need during and after your hospital stay  It is recommended that you continue to drink 3 bottles per day of:       Boost Breeze for at least 1 month (30 days) after your hospital stay -- can be ordered online from the website: SeeTattoos.gl  I also recommend trying Unjury protein powder (unflavored) -- purchase at https://unjury.com/unjury-unflavored-protein-container/  Prosource Plus (https://store.medtrition.com/products/prosource-plus) is the tiny 4ml protein shot that provides 15g protein  Tips for adding a nutrition shake into your routine: As allowed, drink one with vitamins or medications instead of water or juice Enjoy one as a tasty mid-morning or afternoon snack Drink cold or make a milkshake out of it Drink one instead of milk with cereal or snacks Use as a coffee creamer   Available at the following grocery stores and pharmacies:           * [place] [phone]            For COUPONS visit: www.ensure.com/join or http://dawson-may.com/   Suggested Substitutions Ensure Plus = Boost Plus = Carnation Breakfast Essentials = Boost Compact Ensure Active Clear = Boost Breeze Glucerna Shake = Boost Glucose Control = Carnation Breakfast Essentials SUGAR FREE

## 2021-04-23 DIAGNOSIS — Z9981 Dependence on supplemental oxygen: Secondary | ICD-10-CM | POA: Diagnosis not present

## 2021-04-23 DIAGNOSIS — Z7951 Long term (current) use of inhaled steroids: Secondary | ICD-10-CM | POA: Diagnosis not present

## 2021-04-23 DIAGNOSIS — J449 Chronic obstructive pulmonary disease, unspecified: Secondary | ICD-10-CM | POA: Diagnosis not present

## 2021-05-01 ENCOUNTER — Telehealth: Payer: Self-pay

## 2021-05-01 DIAGNOSIS — R269 Unspecified abnormalities of gait and mobility: Secondary | ICD-10-CM | POA: Diagnosis not present

## 2021-05-01 DIAGNOSIS — I471 Supraventricular tachycardia: Secondary | ICD-10-CM | POA: Diagnosis not present

## 2021-05-01 DIAGNOSIS — Z7189 Other specified counseling: Secondary | ICD-10-CM | POA: Diagnosis not present

## 2021-05-01 DIAGNOSIS — I739 Peripheral vascular disease, unspecified: Secondary | ICD-10-CM | POA: Diagnosis not present

## 2021-05-01 DIAGNOSIS — S32000A Wedge compression fracture of unspecified lumbar vertebra, initial encounter for closed fracture: Secondary | ICD-10-CM | POA: Diagnosis not present

## 2021-05-01 DIAGNOSIS — I480 Paroxysmal atrial fibrillation: Secondary | ICD-10-CM | POA: Diagnosis not present

## 2021-05-01 DIAGNOSIS — S2242XA Multiple fractures of ribs, left side, initial encounter for closed fracture: Secondary | ICD-10-CM | POA: Diagnosis not present

## 2021-05-01 DIAGNOSIS — I10 Essential (primary) hypertension: Secondary | ICD-10-CM | POA: Diagnosis not present

## 2021-05-01 DIAGNOSIS — F332 Major depressive disorder, recurrent severe without psychotic features: Secondary | ICD-10-CM | POA: Diagnosis not present

## 2021-05-01 DIAGNOSIS — J849 Interstitial pulmonary disease, unspecified: Secondary | ICD-10-CM | POA: Diagnosis not present

## 2021-05-01 DIAGNOSIS — J441 Chronic obstructive pulmonary disease with (acute) exacerbation: Secondary | ICD-10-CM | POA: Diagnosis not present

## 2021-05-01 DIAGNOSIS — I2699 Other pulmonary embolism without acute cor pulmonale: Secondary | ICD-10-CM | POA: Diagnosis not present

## 2021-05-01 NOTE — Telephone Encounter (Signed)
Spoke with patient's son Cherlynn Kaiser and scheduled a Mychart Palliative Consult for 05/04/21 @ 1:30 PM.  ? ?Consent obtained; updated Outlook/Netsmart/Team List and Epic.  ? ?

## 2021-05-01 NOTE — Telephone Encounter (Signed)
Received a Palliative care referral from Claud Kelp NP.  ?

## 2021-05-02 DIAGNOSIS — D649 Anemia, unspecified: Secondary | ICD-10-CM | POA: Diagnosis not present

## 2021-05-02 DIAGNOSIS — G8929 Other chronic pain: Secondary | ICD-10-CM | POA: Diagnosis not present

## 2021-05-02 DIAGNOSIS — M797 Fibromyalgia: Secondary | ICD-10-CM | POA: Diagnosis not present

## 2021-05-02 DIAGNOSIS — I4729 Other ventricular tachycardia: Secondary | ICD-10-CM | POA: Diagnosis not present

## 2021-05-02 DIAGNOSIS — E1159 Type 2 diabetes mellitus with other circulatory complications: Secondary | ICD-10-CM | POA: Diagnosis not present

## 2021-05-02 DIAGNOSIS — E039 Hypothyroidism, unspecified: Secondary | ICD-10-CM | POA: Diagnosis not present

## 2021-05-02 DIAGNOSIS — E1140 Type 2 diabetes mellitus with diabetic neuropathy, unspecified: Secondary | ICD-10-CM | POA: Diagnosis not present

## 2021-05-02 DIAGNOSIS — I2699 Other pulmonary embolism without acute cor pulmonale: Secondary | ICD-10-CM | POA: Diagnosis not present

## 2021-05-02 DIAGNOSIS — Z7901 Long term (current) use of anticoagulants: Secondary | ICD-10-CM | POA: Diagnosis not present

## 2021-05-02 DIAGNOSIS — S2242XD Multiple fractures of ribs, left side, subsequent encounter for fracture with routine healing: Secondary | ICD-10-CM | POA: Diagnosis not present

## 2021-05-02 DIAGNOSIS — J449 Chronic obstructive pulmonary disease, unspecified: Secondary | ICD-10-CM | POA: Diagnosis not present

## 2021-05-02 DIAGNOSIS — J9610 Chronic respiratory failure, unspecified whether with hypoxia or hypercapnia: Secondary | ICD-10-CM | POA: Diagnosis not present

## 2021-05-02 DIAGNOSIS — I2721 Secondary pulmonary arterial hypertension: Secondary | ICD-10-CM | POA: Diagnosis not present

## 2021-05-02 DIAGNOSIS — I152 Hypertension secondary to endocrine disorders: Secondary | ICD-10-CM | POA: Diagnosis not present

## 2021-05-02 DIAGNOSIS — Z9981 Dependence on supplemental oxygen: Secondary | ICD-10-CM | POA: Diagnosis not present

## 2021-05-02 DIAGNOSIS — E46 Unspecified protein-calorie malnutrition: Secondary | ICD-10-CM | POA: Diagnosis not present

## 2021-05-02 DIAGNOSIS — Z7951 Long term (current) use of inhaled steroids: Secondary | ICD-10-CM | POA: Diagnosis not present

## 2021-05-02 DIAGNOSIS — M170 Bilateral primary osteoarthritis of knee: Secondary | ICD-10-CM | POA: Diagnosis not present

## 2021-05-02 DIAGNOSIS — I5032 Chronic diastolic (congestive) heart failure: Secondary | ICD-10-CM | POA: Diagnosis not present

## 2021-05-02 DIAGNOSIS — E538 Deficiency of other specified B group vitamins: Secondary | ICD-10-CM | POA: Diagnosis not present

## 2021-05-02 DIAGNOSIS — I4891 Unspecified atrial fibrillation: Secondary | ICD-10-CM | POA: Diagnosis not present

## 2021-05-02 DIAGNOSIS — I959 Hypotension, unspecified: Secondary | ICD-10-CM | POA: Diagnosis not present

## 2021-05-02 DIAGNOSIS — M810 Age-related osteoporosis without current pathological fracture: Secondary | ICD-10-CM | POA: Diagnosis not present

## 2021-05-02 DIAGNOSIS — Z79899 Other long term (current) drug therapy: Secondary | ICD-10-CM | POA: Diagnosis not present

## 2021-05-04 ENCOUNTER — Other Ambulatory Visit: Payer: Self-pay

## 2021-05-04 ENCOUNTER — Telehealth: Payer: PPO | Admitting: Family Medicine

## 2021-05-05 ENCOUNTER — Other Ambulatory Visit: Payer: PPO | Admitting: Family Medicine

## 2021-05-05 ENCOUNTER — Other Ambulatory Visit: Payer: Self-pay

## 2021-05-05 VITALS — BP 110/70 | HR 77 | Temp 97.4°F | Resp 22

## 2021-05-05 DIAGNOSIS — Z9981 Dependence on supplemental oxygen: Secondary | ICD-10-CM

## 2021-05-05 DIAGNOSIS — Z515 Encounter for palliative care: Secondary | ICD-10-CM | POA: Diagnosis not present

## 2021-05-05 DIAGNOSIS — E43 Unspecified severe protein-calorie malnutrition: Secondary | ICD-10-CM

## 2021-05-05 DIAGNOSIS — J9611 Chronic respiratory failure with hypoxia: Secondary | ICD-10-CM

## 2021-05-05 DIAGNOSIS — J439 Emphysema, unspecified: Secondary | ICD-10-CM | POA: Diagnosis not present

## 2021-05-05 DIAGNOSIS — S2242XD Multiple fractures of ribs, left side, subsequent encounter for fracture with routine healing: Secondary | ICD-10-CM | POA: Diagnosis not present

## 2021-05-05 DIAGNOSIS — J449 Chronic obstructive pulmonary disease, unspecified: Secondary | ICD-10-CM | POA: Diagnosis not present

## 2021-05-05 NOTE — Progress Notes (Signed)
[facility] Consult Note Telephone: [phone]  Fax: [phone]   Date of encounter: 05/05/21 2:04 PM PATIENT NAME: [name] [place] [id number]   [phone] (home)  DOB: [birth year] MRN: [id number] PRIMARY CARE PROVIDER:    Reynold Bowen, MD,  [place] [address] [phone]  REFERRING PROVIDER:   Reynold Bowen, [place],  [place] [address] [phone]  RESPONSIBLE PARTY:    Contact Information     Name Relation Home Work [place] Daughter [phone]     [name], Kelsey [phone]  [phone]        I met face to face with patient in her home. Palliative Care was asked to follow this patient by consultation request of  Reynold Bowen, MD to address advance care planning and complex medical decision making. This is the initial visit.          ASSESSMENT, SYMPTOM MANAGEMENT AND PLAN / RECOMMENDATIONS:   Palliative Care Encounter DNR completed Son [name], daughter Gerald Stabs are [facility] decision makers MOST form reviewed and copy left for pt to discuss with family, will follow up at next visit for completion.   Chronic Hypoxic Respiratory Failure on home O2 Secondary to COPD, no evidence of exacerbation. Stable on O2 @ 4 L/min.   Rib Fractures Encouraged to do deep breathing exercises Tylenol prn    Severe Protein Calorie Malnutrition Continue Boost Breeze supplement and increase to BID Encouraged to increase protein intake with meals    Follow up Palliative Care Visit: Palliative care will continue to follow for complex medical decision making, advance care planning, and clarification of goals. Return 4 weeks or prn.    This visit was coded based on medical decision making (MDM).  PPS: 40%  ( partially impacted by debilitation from rib and compression fractures)  HOSPICE ELIGIBILITY/DIAGNOSIS: TBD  Chief Complaint:  [facility] received a referral to follow up with patient for chronic disease management in setting of COPD with chronic respiratory failure, to help with advance directives and defining/refining goals of care.   HISTORY OF PRESENT ILLNESS:  [name] is a 87 y.o. year old female with hx of bilateral breast cancer, COPD with chronic hypoxic/O2 dependent respiratory failure, adrenal insufficiency, PE,  IBS and hypothyroidism, anemia and protein calorie malnutrition.  Problem list also includes DM but pt adamantly denies any hx of DM and says "My primary would have told me if I have diabetes."  Currently she is dependent for bathing, dressing, is incontinent of urine (using purewic female catheter) and largely bed or chair bound at present due to multiple rib and spinal compression fractures. Prior to fall with rib fractures she was independent with ADLs. Has multiple old compression fractures and T3 acute compression fracture. SOB and pain from left lateral wall chest pain from rib fractures.  She states there is no pain if she remains still, hurts multiple places and uses fentanyl patch that her mother had previously.  Son, [name] lives nearby and checks in on her routinely and ensures she has food to eat.  She has fallen twice in the last 2-3 weeks with no injury. Fair appetite.Drinks [employer] daily.  Wearing compression socks bilaterally.  Sleeps good when finally gets to sleep.   Mood is pretty good, states she was told by physician that she has 1 year or less and this is weighing on her mind but she is determined to  get back to being independent.  Albumin 2.9 on 04/13/21.  History obtained from review of EMR, discussion with Ms. Mcfaul.  I reviewed available labs, medications, imaging, studies and related documents from the EMR.  Records reviewed and summarized above.   ROS General: NAD EYES: denies vision changes ENMT: denies dysphagia Cardiovascular: endorses left lateral  chest wall pain-eases some with application of pressure, endorses DOE Pulmonary: denies cough, denies increased SOB Abdomen: endorses fair appetite, denies constipation, endorses continence of bowel GU: denies dysuria, uses purewic catheter to suction at present MSK:  endorses increased weakness, 2 falls reported in last 3 weeks Skin: denies rashes or wounds Neurological: endorses some difficulty getting to sleep Psych: Endorses positive mood and determination to get back to prior level of independence Heme/lymph/immuno: denies bruises, abnormal bleeding  Physical Exam: Current and past weights: 131 lbs 2.8 ounces as of 04/23/21 Constitutional: NAD General: frail appearing, thin EYES: anicteric sclera, lids intact, no discharge  ENMT: intact hearing, oral mucous membranes moist, dentition intact CV: S1S2, RRR with LUSB murmur, 1+ BLE edema wearing compression hose Pulmonary: CTA right lung, diminished in base, Left lung with fine crackles, no increased work of breathing, no cough, O2 at 4L Abdomen: normo-active BS + 4 quadrants, soft and non tender, no ascites GU: deferred, purewic catheter to suction MSK: no sarcopenia, moves all extremities, bedbound currently with rib fractures and vertebral compression fracture Skin: warm and dry, no rashes or wounds on visible skin Neuro:  no generalized weakness, no cognitive impairment Psych: non-anxious affect, A and O x 3 Hem/lymph/immuno: no widespread bruising  CURRENT PROBLEM LIST:  Patient Active Problem List   Diagnosis Date Noted   History of pulmonary embolism 04/20/2021   Physical deconditioning 04/20/2021   Elevated troponin 04/20/2021   Diabetic neuropathy ([place]) 04/20/2021   Acute on chronic respiratory failure with hypoxia ([place]) 03/13/2021   Rib fractures 03/13/2021   COPD, severe ([place]) 03/13/2021   Diabetes mellitus type 2, noninsulin dependent ([place]) 03/13/2021   Pulmonary embolism ([place]) 03/13/2021   Essential hypertension  03/13/2021   Sepsis ([place]) 04/10/2014   History of DVT (deep vein thrombosis)    Blood poisoning    Chronic venous insufficiency 02/15/2014   Paroxysmal SVT (supraventricular tachycardia) (HCC) 09/11/2013   Heart palpitations 08/22/2013   H. pylori infection 05/04/2013   DVT, LLE 04/18/2013   Anxiety and depression 04/18/2013   Hypothyroidism 04/18/2013   Cellulitis 04/10/2013   UTI (urinary tract infection) 04/10/2013   Hyponatremia 04/10/2013   Hypokalemia 04/10/2013   Leg edema, left 04/10/2013   Nausea with vomiting 04/10/2013   Adult failure to thrive 04/10/2013   Protein-calorie malnutrition, severe ([place]) 04/10/2013   Renal insufficiency 03/10/2013   GI bleed 03/10/2013   UGIB (upper gastrointestinal bleed) 03/10/2013   Breast cancer of upper-outer quadrant of right female breast ([place]) 01/04/2013   Neoplasm of left breast, primary tumor staging category Tis: ductal carcinoma in situ (DCIS) 01/04/2013   Vertebral compression fracture ([place]) 10/30/2012   Adrenal insufficiency (HCC) 10/30/2012   Osteoporosis 10/30/2012   Irritable bowel syndrome 10/30/2012   Peripheral neuropathy 10/30/2012   Unsteady gait 10/30/2012   Recurrent falls 10/30/2012   Chronic cough 07/18/2012   Snoring 06/14/2012   Thin skin    Ecchymosis    Seasonal allergies    Frequency of urination    Nocturia    Anemia    Unspecified hereditary and idiopathic peripheral neuropathy    Acute blood loss anemia 01/02/2011   Low blood pressure  01/02/2011   Low serum cortisol level 01/02/2011   Dyslipidemia 01/23/2010   Obstructive sleep apnea 01/23/2010   MASS, LUNG 01/23/2010   PAST MEDICAL HISTORY:  Active Ambulatory Problems    Diagnosis Date Noted   Dyslipidemia 01/23/2010   Obstructive sleep apnea 01/23/2010   MASS, LUNG 01/23/2010   Acute blood loss anemia 01/02/2011   Low blood pressure 01/02/2011   Low serum cortisol level 01/02/2011   Thin skin    Ecchymosis    Seasonal allergies     Frequency of urination    Nocturia    Anemia    Unspecified hereditary and idiopathic peripheral neuropathy    Snoring 06/14/2012   Chronic cough 07/18/2012   Vertebral compression fracture ([place]) 10/30/2012   Adrenal insufficiency ([place]) 10/30/2012   Osteoporosis 10/30/2012   Irritable bowel syndrome 10/30/2012   Peripheral neuropathy 10/30/2012   Unsteady gait 10/30/2012   Recurrent falls 10/30/2012   Breast cancer of upper-outer quadrant of right female breast ([place]) 01/04/2013   Neoplasm of left breast, primary tumor staging category Tis: ductal carcinoma in situ (DCIS) 01/04/2013   Renal insufficiency 03/10/2013   GI bleed 03/10/2013   UGIB (upper gastrointestinal bleed) 03/10/2013   Cellulitis 04/10/2013   UTI (urinary tract infection) 04/10/2013   Hyponatremia 04/10/2013   Hypokalemia 04/10/2013   Leg edema, left 04/10/2013   Nausea with vomiting 04/10/2013   Adult failure to thrive 04/10/2013   Protein-calorie malnutrition, severe ([place]) 04/10/2013   DVT, LLE 04/18/2013   Anxiety and depression 04/18/2013   Hypothyroidism 04/18/2013   H. pylori infection 05/04/2013   Heart palpitations 08/22/2013   Paroxysmal SVT (supraventricular tachycardia) ([place]) 09/11/2013   Chronic venous insufficiency 02/15/2014   Sepsis ([place]) 04/10/2014   History of DVT (deep vein thrombosis)    Blood poisoning    Acute on chronic respiratory failure with hypoxia ([place]) 03/13/2021   Rib fractures 03/13/2021   COPD, severe ([place]) 03/13/2021   Diabetes mellitus type 2, noninsulin dependent ([place]) 03/13/2021   Pulmonary embolism ([place]) 03/13/2021   Essential hypertension 03/13/2021   History of pulmonary embolism 04/20/2021   Physical deconditioning 04/20/2021   Elevated troponin 04/20/2021   Diabetic neuropathy ([place]) 04/20/2021   Resolved Ambulatory Problems    Diagnosis Date Noted   ASTHMA 01/23/2010   Cough 03/19/2010   Breast cancer, stage 2 Right 09/07/2010   Laceration of leg, left  10/21/2010   Closed intertrochanteric fracture of right hip ([place]) 01/02/2011   Lung mass    Seizures ([place])    CAP (community acquired pneumonia) 05/04/2013   Past Medical History:  Diagnosis Date   Asthma    CHF (congestive heart failure) (HCC)    COPD (chronic obstructive pulmonary disease) ([place])    Diabetes mellitus without complication ([place])    Fibromyalgia    History of breast cancer LEFT BREAST DCIS  1996   Hyperlipidemia    Hypertension    IBS (irritable bowel syndrome)    Neuropathy due to chemotherapeutic drug ([place]) NUMBNESS / TINGLING FEET AND HANDS   PONV (postoperative nausea and vomiting)    SOCIAL HX:  Social History   Tobacco Use   Smoking status: Former    Packs/day: 1.00    Years: 30.00    Pack years: 30.00    Types: Cigarettes    Quit date: 03/01/1978    Years since quitting: 43.2   Smokeless tobacco: Never  Substance Use Topics   Alcohol use: Yes    Alcohol/week: 7.0 - 10.0 standard drinks  Types: 7 - 10 Glasses of wine per week    Comment: 1 glass of wine daily   FAMILY HX:  Family History  Problem Relation Age of Onset   Emphysema Father    CAD Father    Ovarian cancer Mother    Cancer Mother        cervical   Alzheimer's disease Mother    Hyperlipidemia Mother    Breast cancer Mother    Cancer Sister        cervical   Breast cancer Sister    Breast cancer Maternal Grandmother    Breast cancer Maternal Aunt        Preferred Pharmacy: ALLERGIES:  Allergies  Allergen Reactions   Penicillins Anaphylaxis   Adhesive [Tape] Other (See Comments)    Blisters    Morphine     Other reaction(s): Other (See Comments) HALLUCINATIONS   Doxycycline Nausea And Vomiting   Morphine And Related Other (See Comments)    HALLUCINATIONS     PERTINENT MEDICATIONS:  Outpatient Encounter Medications as of 05/05/2021  Medication Sig   acetaminophen (TYLENOL) 500 MG tablet Take 2 tablets (1,000 mg total) by mouth every 8 (eight) hours.   albuterol  (PROVENTIL) (2.5 MG/3ML) 0.083% nebulizer solution Take 1 vial (2.80m) by nebulization every 6 (six) hours as needed for wheezing.   albuterol (PROVENTIL HFA;VENTOLIN HFA) 108 (90 Base) MCG/ACT inhaler Inhale 2 puffs into the lungs every 6 (six) hours as needed for wheezing or shortness of breath.   apixaban (ELIQUIS) 5 MG TABS tablet Take 1 tablet (5 mg total) by mouth 2 (two) times daily.   ascorbic acid (VITAMIN C) 500 MG tablet Take 1 tablet (500 mg total) by mouth 2 (two) times daily.   feeding supplement (BOOST HIGH PROTEIN) LIQD Take 1 Container by mouth daily. Wild berry   gabapentin (NEURONTIN) 100 MG capsule Take 100 mg by mouth 2 (two) times daily. 8 am and 2 pm   gabapentin (NEURONTIN) 300 MG capsule Take 300 mg by mouth at bedtime.   levothyroxine (SYNTHROID) 100 MCG tablet Take 1 tablet (100 mcg total) by mouth daily before breakfast. For hypothyroid   lidocaine (LIDODERM) 5 % Place 1 patch onto the skin daily. Remove & Discard patch within 12 hours or as directed by MD. Apply to left upper lateral chest wall at site of maximum pain.   LORazepam (ATIVAN) 0.5 MG tablet Take 1 tablet (0.5 mg total) by mouth every 8 (eight) hours as needed for anxiety. Anxiety   midodrine (PROAMATINE) 5 MG tablet Take 5 mg by mouth 2 (two) times daily.   Multiple Vitamins-Minerals (CERTAVITE/ANTIOXIDANTS) TABS Take 1 tablet by mouth daily.   Nutritional Supplements (,FEEDING SUPPLEMENT, PROSOURCE PLUS) liquid Take 30 mLs by mouth 3 (three) times daily between meals. (Patient not taking: Reported on 04/19/2021)   OXYGEN Inhale 4 L into the lungs continuous.   pantoprazole (PROTONIX) 40 MG tablet Take 1 tablet (40 mg total) by mouth daily.   polyethylene glycol (MIRALAX) 17 g packet Take 17 g by mouth daily.   predniSONE (DELTASONE) 10 MG tablet Take 3 tablets (30 mg) by mouth once daily for 5 days, take 2 tabs daily for 5 days, then take 1 & 1/2 tabs (15 mg) daily until seen by your pulmonologist.    Selenium 100 MCG TABS Take 1 tablet (100 mcg total) by mouth daily for 14 days.   simvastatin (ZOCOR) 80 MG tablet Take 80 mg by mouth daily.   traMADol (Veatrice Bourbon  50 MG tablet Take 1 tablet (50 mg total) by mouth every 6 (six) hours as needed for severe pain.   TRELEGY ELLIPTA 200-62.5-25 MCG/ACT AEPB Take 1 puff by mouth daily.   vitamin A 3 MG (10000 UNITS) capsule Take 2 capsules (20,000 Units total) by mouth daily for 14 days.   cyanocobalamin 1000 MCG tablet Take 1 tablet (1,000 mcg total) by mouth daily.   Facility-Administered Encounter Medications as of 05/05/2021  Medication   fentaNYL (SUBLIMAZE) injection 25-50 mcg     ---------------------------------------------------------------------------------------------------------------------------------------------------------------------------------------------------------------- Advance Care Planning/Goals of Care: Goals include to maximize quality of life and symptom management. Patient gave her permission to discuss.Our advance care planning conversation included a discussion about:    The value and importance of advance care planning  Exploration of personal, cultural or spiritual beliefs that might influence medical decisions-Believes she is going to get back to prior level of function  Exploration of goals of care in the event of a sudden injury or illness -DNR Identification  of a healthcare agent-son [name], daughter Gerald Stabs Review of MOST form, wants to look over and discuss with family. Decision not to resuscitate or to de-escalate disease focused treatments due to poor prognosis. CODE STATUS: DNR    Thank you for the opportunity to participate in the care of Ms. Jeffries.  The palliative care team will continue to follow. Please call our office at [phone] if we can be of additional assistance.   Marijo Conception, FNP-C  COVID-19 PATIENT SCREENING TOOL Asked and negative response unless otherwise noted:  Have you had  symptoms of covid, tested positive or been in contact with someone with symptoms/positive test in the past 5-10 days? No

## 2021-05-06 ENCOUNTER — Inpatient Hospital Stay: Payer: PPO | Admitting: Nurse Practitioner

## 2021-05-06 ENCOUNTER — Encounter: Payer: Self-pay | Admitting: Family Medicine

## 2021-05-06 DIAGNOSIS — J9611 Chronic respiratory failure with hypoxia: Secondary | ICD-10-CM | POA: Insufficient documentation

## 2021-05-06 DIAGNOSIS — Z515 Encounter for palliative care: Secondary | ICD-10-CM | POA: Insufficient documentation

## 2021-05-06 DIAGNOSIS — Z9981 Dependence on supplemental oxygen: Secondary | ICD-10-CM | POA: Insufficient documentation

## 2021-05-08 DIAGNOSIS — S2242XA Multiple fractures of ribs, left side, initial encounter for closed fracture: Secondary | ICD-10-CM | POA: Diagnosis not present

## 2021-05-08 DIAGNOSIS — J849 Interstitial pulmonary disease, unspecified: Secondary | ICD-10-CM | POA: Diagnosis not present

## 2021-05-08 DIAGNOSIS — I509 Heart failure, unspecified: Secondary | ICD-10-CM | POA: Diagnosis not present

## 2021-05-08 DIAGNOSIS — I5043 Acute on chronic combined systolic (congestive) and diastolic (congestive) heart failure: Secondary | ICD-10-CM | POA: Diagnosis not present

## 2021-05-08 DIAGNOSIS — I739 Peripheral vascular disease, unspecified: Secondary | ICD-10-CM | POA: Diagnosis not present

## 2021-05-08 DIAGNOSIS — R0602 Shortness of breath: Secondary | ICD-10-CM | POA: Diagnosis not present

## 2021-05-13 ENCOUNTER — Inpatient Hospital Stay: Payer: PPO | Admitting: Nurse Practitioner

## 2021-05-14 DIAGNOSIS — I4891 Unspecified atrial fibrillation: Secondary | ICD-10-CM | POA: Diagnosis not present

## 2021-05-14 DIAGNOSIS — E1140 Type 2 diabetes mellitus with diabetic neuropathy, unspecified: Secondary | ICD-10-CM | POA: Diagnosis not present

## 2021-05-14 DIAGNOSIS — I5032 Chronic diastolic (congestive) heart failure: Secondary | ICD-10-CM | POA: Diagnosis not present

## 2021-05-14 DIAGNOSIS — M797 Fibromyalgia: Secondary | ICD-10-CM | POA: Diagnosis not present

## 2021-05-14 DIAGNOSIS — I152 Hypertension secondary to endocrine disorders: Secondary | ICD-10-CM | POA: Diagnosis not present

## 2021-05-14 DIAGNOSIS — I2699 Other pulmonary embolism without acute cor pulmonale: Secondary | ICD-10-CM | POA: Diagnosis not present

## 2021-05-14 DIAGNOSIS — I959 Hypotension, unspecified: Secondary | ICD-10-CM | POA: Diagnosis not present

## 2021-05-14 DIAGNOSIS — G8929 Other chronic pain: Secondary | ICD-10-CM | POA: Diagnosis not present

## 2021-05-14 DIAGNOSIS — S2242XD Multiple fractures of ribs, left side, subsequent encounter for fracture with routine healing: Secondary | ICD-10-CM | POA: Diagnosis not present

## 2021-05-14 DIAGNOSIS — J9610 Chronic respiratory failure, unspecified whether with hypoxia or hypercapnia: Secondary | ICD-10-CM | POA: Diagnosis not present

## 2021-05-14 DIAGNOSIS — M810 Age-related osteoporosis without current pathological fracture: Secondary | ICD-10-CM | POA: Diagnosis not present

## 2021-05-14 DIAGNOSIS — I4729 Other ventricular tachycardia: Secondary | ICD-10-CM | POA: Diagnosis not present

## 2021-05-14 DIAGNOSIS — E46 Unspecified protein-calorie malnutrition: Secondary | ICD-10-CM | POA: Diagnosis not present

## 2021-05-14 DIAGNOSIS — D649 Anemia, unspecified: Secondary | ICD-10-CM | POA: Diagnosis not present

## 2021-05-14 DIAGNOSIS — I2721 Secondary pulmonary arterial hypertension: Secondary | ICD-10-CM | POA: Diagnosis not present

## 2021-05-14 DIAGNOSIS — E538 Deficiency of other specified B group vitamins: Secondary | ICD-10-CM | POA: Diagnosis not present

## 2021-05-14 DIAGNOSIS — M170 Bilateral primary osteoarthritis of knee: Secondary | ICD-10-CM | POA: Diagnosis not present

## 2021-05-14 DIAGNOSIS — Z9981 Dependence on supplemental oxygen: Secondary | ICD-10-CM | POA: Diagnosis not present

## 2021-05-14 DIAGNOSIS — E039 Hypothyroidism, unspecified: Secondary | ICD-10-CM | POA: Diagnosis not present

## 2021-05-14 DIAGNOSIS — E1159 Type 2 diabetes mellitus with other circulatory complications: Secondary | ICD-10-CM | POA: Diagnosis not present

## 2021-05-14 DIAGNOSIS — J449 Chronic obstructive pulmonary disease, unspecified: Secondary | ICD-10-CM | POA: Diagnosis not present

## 2021-05-14 DIAGNOSIS — Z7901 Long term (current) use of anticoagulants: Secondary | ICD-10-CM | POA: Diagnosis not present

## 2021-05-14 DIAGNOSIS — Z7951 Long term (current) use of inhaled steroids: Secondary | ICD-10-CM | POA: Diagnosis not present

## 2021-05-14 DIAGNOSIS — Z79899 Other long term (current) drug therapy: Secondary | ICD-10-CM | POA: Diagnosis not present

## 2021-05-15 DIAGNOSIS — I5022 Chronic systolic (congestive) heart failure: Secondary | ICD-10-CM | POA: Diagnosis not present

## 2021-05-20 ENCOUNTER — Inpatient Hospital Stay: Payer: PPO | Admitting: Nurse Practitioner

## 2021-05-21 ENCOUNTER — Telehealth: Payer: Self-pay | Admitting: Emergency Medicine

## 2021-05-21 DIAGNOSIS — I480 Paroxysmal atrial fibrillation: Secondary | ICD-10-CM | POA: Diagnosis not present

## 2021-05-21 DIAGNOSIS — F339 Major depressive disorder, recurrent, unspecified: Secondary | ICD-10-CM | POA: Diagnosis not present

## 2021-05-21 DIAGNOSIS — D692 Other nonthrombocytopenic purpura: Secondary | ICD-10-CM | POA: Diagnosis not present

## 2021-05-21 NOTE — Telephone Encounter (Signed)
She missed her hospital follow up with Katie on 05/20/21. Can we get her in with Dr. Lamonte Sakai for a hospital follow up first available?  ?

## 2021-05-21 NOTE — Telephone Encounter (Signed)
Pt forgot appt & Rescheduled for 05/25/21  ?

## 2021-05-25 ENCOUNTER — Inpatient Hospital Stay: Payer: PPO | Admitting: Nurse Practitioner

## 2021-06-02 DIAGNOSIS — I959 Hypotension, unspecified: Secondary | ICD-10-CM | POA: Diagnosis not present

## 2021-06-02 DIAGNOSIS — Z7901 Long term (current) use of anticoagulants: Secondary | ICD-10-CM | POA: Diagnosis not present

## 2021-06-02 DIAGNOSIS — E46 Unspecified protein-calorie malnutrition: Secondary | ICD-10-CM | POA: Diagnosis not present

## 2021-06-02 DIAGNOSIS — M170 Bilateral primary osteoarthritis of knee: Secondary | ICD-10-CM | POA: Diagnosis not present

## 2021-06-02 DIAGNOSIS — Z9981 Dependence on supplemental oxygen: Secondary | ICD-10-CM | POA: Diagnosis not present

## 2021-06-02 DIAGNOSIS — I5032 Chronic diastolic (congestive) heart failure: Secondary | ICD-10-CM | POA: Diagnosis not present

## 2021-06-02 DIAGNOSIS — Z7951 Long term (current) use of inhaled steroids: Secondary | ICD-10-CM | POA: Diagnosis not present

## 2021-06-02 DIAGNOSIS — E538 Deficiency of other specified B group vitamins: Secondary | ICD-10-CM | POA: Diagnosis not present

## 2021-06-02 DIAGNOSIS — I2721 Secondary pulmonary arterial hypertension: Secondary | ICD-10-CM | POA: Diagnosis not present

## 2021-06-02 DIAGNOSIS — I152 Hypertension secondary to endocrine disorders: Secondary | ICD-10-CM | POA: Diagnosis not present

## 2021-06-02 DIAGNOSIS — Z79899 Other long term (current) drug therapy: Secondary | ICD-10-CM | POA: Diagnosis not present

## 2021-06-02 DIAGNOSIS — E039 Hypothyroidism, unspecified: Secondary | ICD-10-CM | POA: Diagnosis not present

## 2021-06-02 DIAGNOSIS — E1140 Type 2 diabetes mellitus with diabetic neuropathy, unspecified: Secondary | ICD-10-CM | POA: Diagnosis not present

## 2021-06-02 DIAGNOSIS — E1159 Type 2 diabetes mellitus with other circulatory complications: Secondary | ICD-10-CM | POA: Diagnosis not present

## 2021-06-02 DIAGNOSIS — G8929 Other chronic pain: Secondary | ICD-10-CM | POA: Diagnosis not present

## 2021-06-02 DIAGNOSIS — I2699 Other pulmonary embolism without acute cor pulmonale: Secondary | ICD-10-CM | POA: Diagnosis not present

## 2021-06-02 DIAGNOSIS — D649 Anemia, unspecified: Secondary | ICD-10-CM | POA: Diagnosis not present

## 2021-06-02 DIAGNOSIS — J449 Chronic obstructive pulmonary disease, unspecified: Secondary | ICD-10-CM | POA: Diagnosis not present

## 2021-06-02 DIAGNOSIS — I4891 Unspecified atrial fibrillation: Secondary | ICD-10-CM | POA: Diagnosis not present

## 2021-06-02 DIAGNOSIS — M810 Age-related osteoporosis without current pathological fracture: Secondary | ICD-10-CM | POA: Diagnosis not present

## 2021-06-02 DIAGNOSIS — M797 Fibromyalgia: Secondary | ICD-10-CM | POA: Diagnosis not present

## 2021-06-02 DIAGNOSIS — I4729 Other ventricular tachycardia: Secondary | ICD-10-CM | POA: Diagnosis not present

## 2021-06-02 DIAGNOSIS — S2242XD Multiple fractures of ribs, left side, subsequent encounter for fracture with routine healing: Secondary | ICD-10-CM | POA: Diagnosis not present

## 2021-06-02 DIAGNOSIS — J9610 Chronic respiratory failure, unspecified whether with hypoxia or hypercapnia: Secondary | ICD-10-CM | POA: Diagnosis not present

## 2021-06-05 DIAGNOSIS — J449 Chronic obstructive pulmonary disease, unspecified: Secondary | ICD-10-CM | POA: Diagnosis not present

## 2021-06-05 DIAGNOSIS — J439 Emphysema, unspecified: Secondary | ICD-10-CM | POA: Diagnosis not present

## 2021-06-10 ENCOUNTER — Telehealth (HOSPITAL_COMMUNITY): Payer: Self-pay | Admitting: Vascular Surgery

## 2021-06-10 NOTE — Telephone Encounter (Signed)
Pt is not appropriate for  AHF clinic left VM w/ Tisha @ Dr. Forde Dandy office ?

## 2021-06-12 DIAGNOSIS — E1142 Type 2 diabetes mellitus with diabetic polyneuropathy: Secondary | ICD-10-CM | POA: Diagnosis not present

## 2021-06-12 DIAGNOSIS — I2699 Other pulmonary embolism without acute cor pulmonale: Secondary | ICD-10-CM | POA: Diagnosis not present

## 2021-06-12 DIAGNOSIS — J441 Chronic obstructive pulmonary disease with (acute) exacerbation: Secondary | ICD-10-CM | POA: Diagnosis not present

## 2021-06-12 DIAGNOSIS — I872 Venous insufficiency (chronic) (peripheral): Secondary | ICD-10-CM | POA: Diagnosis not present

## 2021-06-12 DIAGNOSIS — M170 Bilateral primary osteoarthritis of knee: Secondary | ICD-10-CM | POA: Diagnosis not present

## 2021-06-12 DIAGNOSIS — E1159 Type 2 diabetes mellitus with other circulatory complications: Secondary | ICD-10-CM | POA: Diagnosis not present

## 2021-06-12 DIAGNOSIS — J9621 Acute and chronic respiratory failure with hypoxia: Secondary | ICD-10-CM | POA: Diagnosis not present

## 2021-06-12 DIAGNOSIS — I959 Hypotension, unspecified: Secondary | ICD-10-CM | POA: Diagnosis not present

## 2021-06-12 DIAGNOSIS — S32000A Wedge compression fracture of unspecified lumbar vertebra, initial encounter for closed fracture: Secondary | ICD-10-CM | POA: Diagnosis not present

## 2021-06-12 DIAGNOSIS — I4891 Unspecified atrial fibrillation: Secondary | ICD-10-CM | POA: Diagnosis not present

## 2021-06-12 DIAGNOSIS — I152 Hypertension secondary to endocrine disorders: Secondary | ICD-10-CM | POA: Diagnosis not present

## 2021-06-12 DIAGNOSIS — G894 Chronic pain syndrome: Secondary | ICD-10-CM | POA: Diagnosis not present

## 2021-06-17 DIAGNOSIS — J9621 Acute and chronic respiratory failure with hypoxia: Secondary | ICD-10-CM | POA: Diagnosis not present

## 2021-06-17 DIAGNOSIS — G4733 Obstructive sleep apnea (adult) (pediatric): Secondary | ICD-10-CM | POA: Diagnosis not present

## 2021-06-17 DIAGNOSIS — M797 Fibromyalgia: Secondary | ICD-10-CM | POA: Diagnosis not present

## 2021-06-17 DIAGNOSIS — M170 Bilateral primary osteoarthritis of knee: Secondary | ICD-10-CM | POA: Diagnosis not present

## 2021-06-17 DIAGNOSIS — M810 Age-related osteoporosis without current pathological fracture: Secondary | ICD-10-CM | POA: Diagnosis not present

## 2021-06-17 DIAGNOSIS — I5033 Acute on chronic diastolic (congestive) heart failure: Secondary | ICD-10-CM | POA: Diagnosis not present

## 2021-06-17 DIAGNOSIS — I152 Hypertension secondary to endocrine disorders: Secondary | ICD-10-CM | POA: Diagnosis not present

## 2021-06-17 DIAGNOSIS — I2721 Secondary pulmonary arterial hypertension: Secondary | ICD-10-CM | POA: Diagnosis not present

## 2021-06-17 DIAGNOSIS — S2242XD Multiple fractures of ribs, left side, subsequent encounter for fracture with routine healing: Secondary | ICD-10-CM | POA: Diagnosis not present

## 2021-06-17 DIAGNOSIS — E43 Unspecified severe protein-calorie malnutrition: Secondary | ICD-10-CM | POA: Diagnosis not present

## 2021-06-17 DIAGNOSIS — I4891 Unspecified atrial fibrillation: Secondary | ICD-10-CM | POA: Diagnosis not present

## 2021-06-17 DIAGNOSIS — I959 Hypotension, unspecified: Secondary | ICD-10-CM | POA: Diagnosis not present

## 2021-06-17 DIAGNOSIS — I872 Venous insufficiency (chronic) (peripheral): Secondary | ICD-10-CM | POA: Diagnosis not present

## 2021-06-17 DIAGNOSIS — J441 Chronic obstructive pulmonary disease with (acute) exacerbation: Secondary | ICD-10-CM | POA: Diagnosis not present

## 2021-06-17 DIAGNOSIS — G894 Chronic pain syndrome: Secondary | ICD-10-CM | POA: Diagnosis not present

## 2021-06-17 DIAGNOSIS — E1151 Type 2 diabetes mellitus with diabetic peripheral angiopathy without gangrene: Secondary | ICD-10-CM | POA: Diagnosis not present

## 2021-06-17 DIAGNOSIS — E1159 Type 2 diabetes mellitus with other circulatory complications: Secondary | ICD-10-CM | POA: Diagnosis not present

## 2021-06-17 DIAGNOSIS — E1142 Type 2 diabetes mellitus with diabetic polyneuropathy: Secondary | ICD-10-CM | POA: Diagnosis not present

## 2021-06-17 DIAGNOSIS — I4729 Other ventricular tachycardia: Secondary | ICD-10-CM | POA: Diagnosis not present

## 2021-06-17 DIAGNOSIS — K580 Irritable bowel syndrome with diarrhea: Secondary | ICD-10-CM | POA: Diagnosis not present

## 2021-06-17 DIAGNOSIS — I2699 Other pulmonary embolism without acute cor pulmonale: Secondary | ICD-10-CM | POA: Diagnosis not present

## 2021-06-17 DIAGNOSIS — F419 Anxiety disorder, unspecified: Secondary | ICD-10-CM | POA: Diagnosis not present

## 2021-06-17 DIAGNOSIS — F32A Depression, unspecified: Secondary | ICD-10-CM | POA: Diagnosis not present

## 2021-06-17 DIAGNOSIS — D649 Anemia, unspecified: Secondary | ICD-10-CM | POA: Diagnosis not present

## 2021-06-23 ENCOUNTER — Emergency Department (HOSPITAL_COMMUNITY): Payer: PPO

## 2021-06-23 ENCOUNTER — Inpatient Hospital Stay (HOSPITAL_COMMUNITY)
Admission: EM | Admit: 2021-06-23 | Discharge: 2021-06-29 | DRG: 551 | Disposition: E | Payer: PPO | Attending: Internal Medicine | Admitting: Internal Medicine

## 2021-06-23 ENCOUNTER — Other Ambulatory Visit: Payer: Self-pay

## 2021-06-23 ENCOUNTER — Encounter (HOSPITAL_COMMUNITY): Payer: Self-pay | Admitting: Emergency Medicine

## 2021-06-23 DIAGNOSIS — W010XXA Fall on same level from slipping, tripping and stumbling without subsequent striking against object, initial encounter: Secondary | ICD-10-CM | POA: Diagnosis present

## 2021-06-23 DIAGNOSIS — S81011A Laceration without foreign body, right knee, initial encounter: Secondary | ICD-10-CM | POA: Diagnosis present

## 2021-06-23 DIAGNOSIS — Z66 Do not resuscitate: Secondary | ICD-10-CM | POA: Diagnosis not present

## 2021-06-23 DIAGNOSIS — M8000XA Age-related osteoporosis with current pathological fracture, unspecified site, initial encounter for fracture: Secondary | ICD-10-CM | POA: Diagnosis not present

## 2021-06-23 DIAGNOSIS — M4856XA Collapsed vertebra, not elsewhere classified, lumbar region, initial encounter for fracture: Principal | ICD-10-CM | POA: Diagnosis present

## 2021-06-23 DIAGNOSIS — S22050A Wedge compression fracture of T5-T6 vertebra, initial encounter for closed fracture: Secondary | ICD-10-CM | POA: Diagnosis not present

## 2021-06-23 DIAGNOSIS — E1140 Type 2 diabetes mellitus with diabetic neuropathy, unspecified: Secondary | ICD-10-CM | POA: Diagnosis present

## 2021-06-23 DIAGNOSIS — Z515 Encounter for palliative care: Secondary | ICD-10-CM | POA: Diagnosis not present

## 2021-06-23 DIAGNOSIS — Z789 Other specified health status: Secondary | ICD-10-CM | POA: Diagnosis not present

## 2021-06-23 DIAGNOSIS — I110 Hypertensive heart disease with heart failure: Secondary | ICD-10-CM | POA: Diagnosis present

## 2021-06-23 DIAGNOSIS — R0602 Shortness of breath: Secondary | ICD-10-CM

## 2021-06-23 DIAGNOSIS — J449 Chronic obstructive pulmonary disease, unspecified: Secondary | ICD-10-CM | POA: Diagnosis not present

## 2021-06-23 DIAGNOSIS — E872 Acidosis, unspecified: Secondary | ICD-10-CM | POA: Diagnosis present

## 2021-06-23 DIAGNOSIS — Z7901 Long term (current) use of anticoagulants: Secondary | ICD-10-CM

## 2021-06-23 DIAGNOSIS — J849 Interstitial pulmonary disease, unspecified: Secondary | ICD-10-CM | POA: Diagnosis present

## 2021-06-23 DIAGNOSIS — R55 Syncope and collapse: Secondary | ICD-10-CM | POA: Diagnosis not present

## 2021-06-23 DIAGNOSIS — Z91048 Other nonmedicinal substance allergy status: Secondary | ICD-10-CM

## 2021-06-23 DIAGNOSIS — E2740 Unspecified adrenocortical insufficiency: Secondary | ICD-10-CM

## 2021-06-23 DIAGNOSIS — Y92009 Unspecified place in unspecified non-institutional (private) residence as the place of occurrence of the external cause: Secondary | ICD-10-CM

## 2021-06-23 DIAGNOSIS — R0902 Hypoxemia: Secondary | ICD-10-CM | POA: Diagnosis not present

## 2021-06-23 DIAGNOSIS — Z9079 Acquired absence of other genital organ(s): Secondary | ICD-10-CM

## 2021-06-23 DIAGNOSIS — I2721 Secondary pulmonary arterial hypertension: Secondary | ICD-10-CM | POA: Diagnosis present

## 2021-06-23 DIAGNOSIS — Z79890 Hormone replacement therapy (postmenopausal): Secondary | ICD-10-CM

## 2021-06-23 DIAGNOSIS — W19XXXA Unspecified fall, initial encounter: Secondary | ICD-10-CM | POA: Diagnosis not present

## 2021-06-23 DIAGNOSIS — Z043 Encounter for examination and observation following other accident: Secondary | ICD-10-CM | POA: Diagnosis not present

## 2021-06-23 DIAGNOSIS — Z9049 Acquired absence of other specified parts of digestive tract: Secondary | ICD-10-CM

## 2021-06-23 DIAGNOSIS — K5730 Diverticulosis of large intestine without perforation or abscess without bleeding: Secondary | ICD-10-CM | POA: Diagnosis not present

## 2021-06-23 DIAGNOSIS — Z961 Presence of intraocular lens: Secondary | ICD-10-CM | POA: Diagnosis present

## 2021-06-23 DIAGNOSIS — F419 Anxiety disorder, unspecified: Secondary | ICD-10-CM | POA: Diagnosis not present

## 2021-06-23 DIAGNOSIS — Z20822 Contact with and (suspected) exposure to covid-19: Secondary | ICD-10-CM | POA: Diagnosis present

## 2021-06-23 DIAGNOSIS — Z90710 Acquired absence of both cervix and uterus: Secondary | ICD-10-CM

## 2021-06-23 DIAGNOSIS — R6510 Systemic inflammatory response syndrome (SIRS) of non-infectious origin without acute organ dysfunction: Secondary | ICD-10-CM | POA: Diagnosis not present

## 2021-06-23 DIAGNOSIS — M797 Fibromyalgia: Secondary | ICD-10-CM | POA: Diagnosis present

## 2021-06-23 DIAGNOSIS — D75839 Thrombocytosis, unspecified: Secondary | ICD-10-CM | POA: Diagnosis present

## 2021-06-23 DIAGNOSIS — Z853 Personal history of malignant neoplasm of breast: Secondary | ICD-10-CM

## 2021-06-23 DIAGNOSIS — Z86718 Personal history of other venous thrombosis and embolism: Secondary | ICD-10-CM | POA: Diagnosis not present

## 2021-06-23 DIAGNOSIS — Z885 Allergy status to narcotic agent status: Secondary | ICD-10-CM

## 2021-06-23 DIAGNOSIS — J9621 Acute and chronic respiratory failure with hypoxia: Secondary | ICD-10-CM

## 2021-06-23 DIAGNOSIS — F32A Depression, unspecified: Secondary | ICD-10-CM | POA: Diagnosis not present

## 2021-06-23 DIAGNOSIS — M810 Age-related osteoporosis without current pathological fracture: Secondary | ICD-10-CM | POA: Diagnosis present

## 2021-06-23 DIAGNOSIS — E871 Hypo-osmolality and hyponatremia: Secondary | ICD-10-CM | POA: Diagnosis present

## 2021-06-23 DIAGNOSIS — R627 Adult failure to thrive: Secondary | ICD-10-CM | POA: Diagnosis not present

## 2021-06-23 DIAGNOSIS — R7401 Elevation of levels of liver transaminase levels: Secondary | ICD-10-CM | POA: Diagnosis not present

## 2021-06-23 DIAGNOSIS — T148XXA Other injury of unspecified body region, initial encounter: Secondary | ICD-10-CM

## 2021-06-23 DIAGNOSIS — Z7189 Other specified counseling: Secondary | ICD-10-CM | POA: Diagnosis not present

## 2021-06-23 DIAGNOSIS — Z79891 Long term (current) use of opiate analgesic: Secondary | ICD-10-CM

## 2021-06-23 DIAGNOSIS — S0990XA Unspecified injury of head, initial encounter: Secondary | ICD-10-CM | POA: Diagnosis not present

## 2021-06-23 DIAGNOSIS — Z83438 Family history of other disorder of lipoprotein metabolism and other lipidemia: Secondary | ICD-10-CM

## 2021-06-23 DIAGNOSIS — S299XXA Unspecified injury of thorax, initial encounter: Secondary | ICD-10-CM | POA: Diagnosis not present

## 2021-06-23 DIAGNOSIS — S32000A Wedge compression fracture of unspecified lumbar vertebra, initial encounter for closed fracture: Secondary | ICD-10-CM | POA: Diagnosis present

## 2021-06-23 DIAGNOSIS — I5032 Chronic diastolic (congestive) heart failure: Secondary | ICD-10-CM | POA: Diagnosis not present

## 2021-06-23 DIAGNOSIS — I5033 Acute on chronic diastolic (congestive) heart failure: Secondary | ICD-10-CM | POA: Diagnosis not present

## 2021-06-23 DIAGNOSIS — Z9981 Dependence on supplemental oxygen: Secondary | ICD-10-CM

## 2021-06-23 DIAGNOSIS — Z923 Personal history of irradiation: Secondary | ICD-10-CM

## 2021-06-23 DIAGNOSIS — Z87891 Personal history of nicotine dependence: Secondary | ICD-10-CM

## 2021-06-23 DIAGNOSIS — Y9301 Activity, walking, marching and hiking: Secondary | ICD-10-CM | POA: Diagnosis present

## 2021-06-23 DIAGNOSIS — E785 Hyperlipidemia, unspecified: Secondary | ICD-10-CM | POA: Diagnosis present

## 2021-06-23 DIAGNOSIS — R54 Age-related physical debility: Secondary | ICD-10-CM | POA: Diagnosis not present

## 2021-06-23 DIAGNOSIS — E039 Hypothyroidism, unspecified: Secondary | ICD-10-CM

## 2021-06-23 DIAGNOSIS — R9431 Abnormal electrocardiogram [ECG] [EKG]: Secondary | ICD-10-CM | POA: Diagnosis present

## 2021-06-23 DIAGNOSIS — Z7951 Long term (current) use of inhaled steroids: Secondary | ICD-10-CM

## 2021-06-23 DIAGNOSIS — I872 Venous insufficiency (chronic) (peripheral): Secondary | ICD-10-CM | POA: Diagnosis not present

## 2021-06-23 DIAGNOSIS — I9589 Other hypotension: Secondary | ICD-10-CM | POA: Diagnosis present

## 2021-06-23 DIAGNOSIS — R112 Nausea with vomiting, unspecified: Secondary | ICD-10-CM | POA: Diagnosis not present

## 2021-06-23 DIAGNOSIS — Z881 Allergy status to other antibiotic agents status: Secondary | ICD-10-CM

## 2021-06-23 DIAGNOSIS — Z86711 Personal history of pulmonary embolism: Secondary | ICD-10-CM | POA: Diagnosis not present

## 2021-06-23 DIAGNOSIS — Z880 Allergy status to penicillin: Secondary | ICD-10-CM

## 2021-06-23 DIAGNOSIS — J9611 Chronic respiratory failure with hypoxia: Secondary | ICD-10-CM

## 2021-06-23 DIAGNOSIS — S3993XA Unspecified injury of pelvis, initial encounter: Secondary | ICD-10-CM | POA: Diagnosis not present

## 2021-06-23 DIAGNOSIS — S2242XA Multiple fractures of ribs, left side, initial encounter for closed fracture: Secondary | ICD-10-CM | POA: Diagnosis not present

## 2021-06-23 DIAGNOSIS — I700 Atherosclerosis of aorta: Secondary | ICD-10-CM | POA: Diagnosis not present

## 2021-06-23 DIAGNOSIS — R296 Repeated falls: Secondary | ICD-10-CM | POA: Diagnosis present

## 2021-06-23 DIAGNOSIS — I4510 Unspecified right bundle-branch block: Secondary | ICD-10-CM | POA: Diagnosis not present

## 2021-06-23 DIAGNOSIS — Z79899 Other long term (current) drug therapy: Secondary | ICD-10-CM

## 2021-06-23 DIAGNOSIS — Z9221 Personal history of antineoplastic chemotherapy: Secondary | ICD-10-CM

## 2021-06-23 DIAGNOSIS — R519 Headache, unspecified: Secondary | ICD-10-CM | POA: Diagnosis not present

## 2021-06-23 DIAGNOSIS — Z90722 Acquired absence of ovaries, bilateral: Secondary | ICD-10-CM

## 2021-06-23 DIAGNOSIS — I5030 Unspecified diastolic (congestive) heart failure: Secondary | ICD-10-CM | POA: Diagnosis present

## 2021-06-23 HISTORY — DX: Hypothyroidism, unspecified: E03.9

## 2021-06-23 LAB — COMPREHENSIVE METABOLIC PANEL
ALT: 31 U/L (ref 0–44)
AST: 45 U/L — ABNORMAL HIGH (ref 15–41)
Albumin: 3.1 g/dL — ABNORMAL LOW (ref 3.5–5.0)
Alkaline Phosphatase: 82 U/L (ref 38–126)
Anion gap: 14 (ref 5–15)
BUN: 16 mg/dL (ref 8–23)
CO2: 21 mmol/L — ABNORMAL LOW (ref 22–32)
Calcium: 8.9 mg/dL (ref 8.9–10.3)
Chloride: 100 mmol/L (ref 98–111)
Creatinine, Ser: 0.83 mg/dL (ref 0.44–1.00)
GFR, Estimated: >60 mL/min (ref >60)
Glucose, Bld: 116 mg/dL — ABNORMAL HIGH (ref 70–99)
Potassium: 3.8 mmol/L (ref 3.5–5.1)
Sodium: 135 mmol/L (ref 135–145)
Total Bilirubin: 0.5 mg/dL (ref 0.3–1.2)
Total Protein: 6.5 g/dL (ref 6.5–8.1)

## 2021-06-23 LAB — SAMPLE TO BLOOD BANK

## 2021-06-23 LAB — URINALYSIS, ROUTINE W REFLEX MICROSCOPIC
Bacteria, UA: NONE SEEN
Bilirubin Urine: NEGATIVE
Glucose, UA: NEGATIVE mg/dL
Hgb urine dipstick: NEGATIVE
Ketones, ur: NEGATIVE mg/dL
Nitrite: NEGATIVE
Protein, ur: NEGATIVE mg/dL
Specific Gravity, Urine: >1.046 — ABNORMAL HIGH (ref 1.005–1.030)
pH: 5 (ref 5.0–8.0)

## 2021-06-23 LAB — RESP PANEL BY RT-PCR (FLU A&B, COVID) ARPGX2
Influenza A by PCR: NEGATIVE
Influenza B by PCR: NEGATIVE
SARS Coronavirus 2 by RT PCR: NEGATIVE

## 2021-06-23 LAB — PROTIME-INR
INR: 1.2 (ref 0.8–1.2)
Prothrombin Time: 15.3 seconds — ABNORMAL HIGH (ref 11.4–15.2)

## 2021-06-23 LAB — CBC
HCT: 47.2 % — ABNORMAL HIGH (ref 36.0–46.0)
Hemoglobin: 14.1 g/dL (ref 12.0–15.0)
MCH: 28.1 pg (ref 26.0–34.0)
MCHC: 29.9 g/dL — ABNORMAL LOW (ref 30.0–36.0)
MCV: 94 fL (ref 80.0–100.0)
Platelets: 417 10*3/uL — ABNORMAL HIGH (ref 150–400)
RBC: 5.02 MIL/uL (ref 3.87–5.11)
RDW: 19.2 % — ABNORMAL HIGH (ref 11.5–15.5)
WBC: 19.3 10*3/uL — ABNORMAL HIGH (ref 4.0–10.5)
nRBC: 0 % (ref 0.0–0.2)

## 2021-06-23 LAB — LACTIC ACID, PLASMA
Lactic Acid, Venous: 3.8 mmol/L (ref 0.5–1.9)
Lactic Acid, Venous: 3.5 mmol/L (ref 0.5–1.9)

## 2021-06-23 LAB — ETHANOL: Alcohol, Ethyl (B): 66 mg/dL — ABNORMAL HIGH (ref <10)

## 2021-06-23 LAB — MAGNESIUM: Magnesium: 1.5 mg/dL — ABNORMAL LOW (ref 1.7–2.4)

## 2021-06-23 LAB — TSH: TSH: 0.573 u[IU]/mL (ref 0.350–4.500)

## 2021-06-23 LAB — I-STAT CHEM 8, ED
BUN: 19 mg/dL (ref 8–23)
Calcium, Ion: 1.02 mmol/L — ABNORMAL LOW (ref 1.15–1.40)
Chloride: 98 mmol/L (ref 98–111)
Creatinine, Ser: 0.8 mg/dL (ref 0.44–1.00)
Glucose, Bld: 116 mg/dL — ABNORMAL HIGH (ref 70–99)
HCT: 47 % — ABNORMAL HIGH (ref 36.0–46.0)
Hemoglobin: 16 g/dL — ABNORMAL HIGH (ref 12.0–15.0)
Potassium: 3.6 mmol/L (ref 3.5–5.1)
Sodium: 134 mmol/L — ABNORMAL LOW (ref 135–145)
TCO2: 25 mmol/L (ref 22–32)

## 2021-06-23 LAB — CK: Total CK: 114 U/L (ref 38–234)

## 2021-06-23 MED ORDER — PANTOPRAZOLE SODIUM 40 MG PO TBEC: 40.0000 mg | DELAYED_RELEASE_TABLET | Freq: Every day | ORAL | Status: DC

## 2021-06-23 MED ORDER — SODIUM CHLORIDE 0.9% FLUSH: 3.0000 mL | Freq: Two times a day (BID) | INTRAVENOUS | Status: DC

## 2021-06-23 MED ORDER — HALOPERIDOL 0.5 MG PO TABS
0.5000 mg | ORAL_TABLET | ORAL | Status: DC | PRN
  Filled 2021-06-23: qty 1

## 2021-06-23 MED ORDER — SODIUM CHLORIDE 0.9 % IV SOLN
2.0000 g | Freq: Once | INTRAVENOUS | Status: AC
  Administered 2021-06-23: 2 g via INTRAVENOUS
  Filled 2021-06-23: qty 12.5

## 2021-06-23 MED ORDER — POLYVINYL ALCOHOL 1.4 % OP SOLN
1.0000 [drp] | Freq: Four times a day (QID) | OPHTHALMIC | Status: DC | PRN
  Filled 2021-06-23: qty 15

## 2021-06-23 MED ORDER — SODIUM CHLORIDE 0.9 % IV BOLUS
500.0000 mL | INTRAVENOUS | Status: AC
  Administered 2021-06-23: 500 mL via INTRAVENOUS

## 2021-06-23 MED ORDER — LORAZEPAM 2 MG/ML IJ SOLN: 1.0000 mg | INTRAMUSCULAR | Status: DC | PRN

## 2021-06-23 MED ORDER — SODIUM CHLORIDE 0.9 % IV BOLUS: 500.0000 mL | Freq: Once | INTRAVENOUS | Status: DC

## 2021-06-23 MED ORDER — HYDROMORPHONE HCL 1 MG/ML IJ SOLN
0.2500 mg | INTRAMUSCULAR | Status: DC | PRN
  Administered 2021-06-23 – 2021-06-24 (×3): 0.5 mg via INTRAVENOUS
  Filled 2021-06-23: qty 1
  Filled 2021-06-23 (×2): qty 0.5

## 2021-06-23 MED ORDER — UMECLIDINIUM BROMIDE 62.5 MCG/ACT IN AEPB
1.0000 | INHALATION_SPRAY | Freq: Every day | RESPIRATORY_TRACT | Status: DC
Start: 2021-06-23 — End: 2021-06-23
  Filled 2021-06-23: qty 7

## 2021-06-23 MED ORDER — SODIUM CHLORIDE 0.9 % IV SOLN: 2.0000 g | Freq: Two times a day (BID) | INTRAVENOUS | Status: DC

## 2021-06-23 MED ORDER — LIDOCAINE 5 % EX PTCH
1.0000 | MEDICATED_PATCH | CUTANEOUS | Status: DC
  Administered 2021-06-23: 1 via TRANSDERMAL
  Filled 2021-06-23: qty 1

## 2021-06-23 MED ORDER — SODIUM CHLORIDE 0.9 % IV BOLUS
500.0000 mL | Freq: Once | INTRAVENOUS | Status: AC
  Administered 2021-06-23: 500 mL via INTRAVENOUS

## 2021-06-23 MED ORDER — GLYCOPYRROLATE 0.2 MG/ML IJ SOLN: 0.2000 mg | INTRAMUSCULAR | Status: DC | PRN

## 2021-06-23 MED ORDER — VANCOMYCIN HCL IN DEXTROSE 1-5 GM/200ML-% IV SOLN
1000.0000 mg | Freq: Once | INTRAVENOUS | Status: DC
  Administered 2021-06-23: 1000 mg via INTRAVENOUS
  Filled 2021-06-23: qty 200

## 2021-06-23 MED ORDER — VITAMIN B-12 1000 MCG PO TABS: 1000.0000 ug | ORAL_TABLET | Freq: Every day | ORAL | Status: DC

## 2021-06-23 MED ORDER — BIOTENE DRY MOUTH MT LIQD: 15.0000 mL | OROMUCOSAL | Status: DC | PRN

## 2021-06-23 MED ORDER — GLYCOPYRROLATE 1 MG PO TABS: 1.0000 mg | ORAL_TABLET | ORAL | Status: DC | PRN

## 2021-06-23 MED ORDER — FENTANYL CITRATE PF 50 MCG/ML IJ SOSY
25.0000 ug | PREFILLED_SYRINGE | INTRAMUSCULAR | Status: DC | PRN
  Administered 2021-06-23 (×3): 25 ug via INTRAVENOUS
  Filled 2021-06-23 (×2): qty 1

## 2021-06-23 MED ORDER — LORAZEPAM 2 MG/ML PO CONC: 1.0000 mg | ORAL | Status: DC | PRN

## 2021-06-23 MED ORDER — ALBUTEROL SULFATE (2.5 MG/3ML) 0.083% IN NEBU: 2.5000 mg | INHALATION_SOLUTION | RESPIRATORY_TRACT | Status: DC | PRN

## 2021-06-23 MED ORDER — HALOPERIDOL LACTATE 2 MG/ML PO CONC
0.5000 mg | ORAL | Status: DC | PRN
  Filled 2021-06-23: qty 0.3

## 2021-06-23 MED ORDER — ONDANSETRON HCL 4 MG/2ML IJ SOLN
INTRAMUSCULAR | Status: AC
  Administered 2021-06-23: 4 mg
  Filled 2021-06-23: qty 2

## 2021-06-23 MED ORDER — MIDODRINE HCL 5 MG PO TABS: 5.0000 mg | ORAL_TABLET | Freq: Two times a day (BID) | ORAL | Status: DC

## 2021-06-23 MED ORDER — FENTANYL CITRATE PF 50 MCG/ML IJ SOSY
50.0000 ug | PREFILLED_SYRINGE | INTRAMUSCULAR | Status: DC | PRN
  Administered 2021-06-23: 50 ug via INTRAVENOUS
  Filled 2021-06-23 (×2): qty 1

## 2021-06-23 MED ORDER — MAGNESIUM SULFATE 2 GM/50ML IV SOLN
2.0000 g | Freq: Once | INTRAVENOUS | Status: AC
  Administered 2021-06-23: 2 g via INTRAVENOUS
  Filled 2021-06-23: qty 50

## 2021-06-23 MED ORDER — LORAZEPAM 1 MG PO TABS: 1.0000 mg | ORAL_TABLET | ORAL | Status: DC | PRN

## 2021-06-23 MED ORDER — APIXABAN 5 MG PO TABS: 5.0000 mg | ORAL_TABLET | Freq: Two times a day (BID) | ORAL | Status: DC

## 2021-06-23 MED ORDER — HALOPERIDOL 1 MG PO TABS
2.0000 mg | ORAL_TABLET | Freq: Four times a day (QID) | ORAL | Status: DC | PRN
  Filled 2021-06-23: qty 2

## 2021-06-23 MED ORDER — METHYLPREDNISOLONE SODIUM SUCC 125 MG IJ SOLR
125.0000 mg | Freq: Once | INTRAMUSCULAR | Status: AC
  Administered 2021-06-23: 125 mg via INTRAVENOUS
  Filled 2021-06-23: qty 2

## 2021-06-23 MED ORDER — PREDNISONE 10 MG PO TABS: 15.0000 mg | ORAL_TABLET | Freq: Every day | ORAL | Status: DC

## 2021-06-23 MED ORDER — HYDROCODONE-ACETAMINOPHEN 5-325 MG PO TABS: 1.0000 | ORAL_TABLET | Freq: Four times a day (QID) | ORAL | Status: DC | PRN

## 2021-06-23 MED ORDER — HALOPERIDOL LACTATE 5 MG/ML IJ SOLN: 2.0000 mg | Freq: Four times a day (QID) | INTRAMUSCULAR | Status: DC | PRN

## 2021-06-23 MED ORDER — LORAZEPAM 1 MG PO TABS: 0.5000 mg | ORAL_TABLET | Freq: Three times a day (TID) | ORAL | Status: DC | PRN

## 2021-06-23 MED ORDER — DIPHENHYDRAMINE HCL 50 MG/ML IJ SOLN: 12.5000 mg | INTRAMUSCULAR | Status: DC | PRN

## 2021-06-23 MED ORDER — FENTANYL CITRATE PF 50 MCG/ML IJ SOSY
25.0000 ug | PREFILLED_SYRINGE | INTRAMUSCULAR | Status: DC | PRN
Start: 2021-06-23 — End: 2021-06-23
  Filled 2021-06-23: qty 1

## 2021-06-23 MED ORDER — HALOPERIDOL LACTATE 2 MG/ML PO CONC
2.0000 mg | Freq: Four times a day (QID) | ORAL | Status: DC | PRN
  Filled 2021-06-23: qty 1

## 2021-06-23 MED ORDER — SODIUM CHLORIDE 0.9 % IV BOLUS
1000.0000 mL | Freq: Once | INTRAVENOUS | Status: AC
Start: 2021-06-23 — End: 2021-06-23
  Administered 2021-06-23: 1000 mL via INTRAVENOUS

## 2021-06-23 MED ORDER — ACETAMINOPHEN 325 MG PO TABS: 650.0000 mg | ORAL_TABLET | Freq: Four times a day (QID) | ORAL | Status: DC | PRN

## 2021-06-23 MED ORDER — MIDODRINE HCL 5 MG PO TABS: 10.0000 mg | ORAL_TABLET | Freq: Two times a day (BID) | ORAL | Status: DC

## 2021-06-23 MED ORDER — ONDANSETRON 4 MG PO TBDP: 4.0000 mg | ORAL_TABLET | Freq: Four times a day (QID) | ORAL | Status: DC | PRN

## 2021-06-23 MED ORDER — LORAZEPAM 2 MG/ML IJ SOLN
1.0000 mg | INTRAMUSCULAR | Status: DC | PRN
  Administered 2021-06-23 – 2021-06-24 (×2): 1 mg via INTRAVENOUS
  Filled 2021-06-23 (×2): qty 1

## 2021-06-23 MED ORDER — ONDANSETRON HCL 4 MG/2ML IJ SOLN: 4.0000 mg | Freq: Four times a day (QID) | INTRAMUSCULAR | Status: DC | PRN

## 2021-06-23 MED ORDER — ESCITALOPRAM OXALATE 10 MG PO TABS
10.0000 mg | ORAL_TABLET | Freq: Every day | ORAL | Status: DC
Start: 2021-06-23 — End: 2021-06-23

## 2021-06-23 MED ORDER — GABAPENTIN 300 MG PO CAPS: 300.0000 mg | ORAL_CAPSULE | Freq: Every day | ORAL | Status: DC

## 2021-06-23 MED ORDER — LEVOTHYROXINE SODIUM 100 MCG PO TABS: 100.0000 ug | ORAL_TABLET | Freq: Every day | ORAL | Status: DC

## 2021-06-23 MED ORDER — ONDANSETRON HCL 4 MG/2ML IJ SOLN
4.0000 mg | Freq: Once | INTRAMUSCULAR | Status: AC
  Administered 2021-06-23: 4 mg via INTRAVENOUS
  Filled 2021-06-23: qty 2

## 2021-06-23 MED ORDER — FLUTICASONE FUROATE-VILANTEROL 200-25 MCG/ACT IN AEPB
1.0000 | INHALATION_SPRAY | Freq: Every day | RESPIRATORY_TRACT | Status: DC
  Filled 2021-06-23: qty 28

## 2021-06-23 MED ORDER — METRONIDAZOLE 500 MG/100ML IV SOLN
500.0000 mg | Freq: Two times a day (BID) | INTRAVENOUS | Status: DC
  Administered 2021-06-23: 500 mg via INTRAVENOUS
  Filled 2021-06-23: qty 100

## 2021-06-23 MED ORDER — SODIUM CHLORIDE 0.9 % IV SOLN: INTRAVENOUS | Status: DC

## 2021-06-23 MED ORDER — ACETAMINOPHEN 650 MG RE SUPP: 650.0000 mg | Freq: Four times a day (QID) | RECTAL | Status: DC | PRN

## 2021-06-23 MED ORDER — IOHEXOL 300 MG/ML  SOLN
80.0000 mL | Freq: Once | INTRAMUSCULAR | Status: AC | PRN
  Administered 2021-06-23: 80 mL via INTRAVENOUS

## 2021-06-23 MED ORDER — BIOTENE DRY MOUTH MT LIQD
15.0000 mL | Freq: Two times a day (BID) | OROMUCOSAL | Status: DC
  Administered 2021-06-23 – 2021-06-24 (×2): 15 mL via TOPICAL

## 2021-06-23 MED ORDER — ATORVASTATIN CALCIUM 40 MG PO TABS: 40.0000 mg | ORAL_TABLET | Freq: Every day | ORAL | Status: DC

## 2021-06-23 MED ORDER — GLYCOPYRROLATE 1 MG PO TABS
1.0000 mg | ORAL_TABLET | ORAL | Status: DC | PRN
  Filled 2021-06-23: qty 1

## 2021-06-23 MED ORDER — GABAPENTIN 100 MG PO CAPS: 100.0000 mg | ORAL_CAPSULE | Freq: Two times a day (BID) | ORAL | Status: DC

## 2021-06-23 MED ORDER — TRIMETHOBENZAMIDE HCL 100 MG/ML IM SOLN: 200.0000 mg | Freq: Four times a day (QID) | INTRAMUSCULAR | Status: DC | PRN

## 2021-06-23 MED ORDER — PREDNISONE 5 MG PO TABS: 15.0000 mg | ORAL_TABLET | Freq: Every day | ORAL | Status: DC

## 2021-06-23 MED ORDER — HALOPERIDOL LACTATE 5 MG/ML IJ SOLN: 0.5000 mg | INTRAMUSCULAR | Status: DC | PRN

## 2021-06-23 NOTE — Progress Notes (Signed)
Pharmacy Antibiotic Note ? ?[name] is a 87 y.o. female admitted on 06/06/2021 with sepsis.  Pharmacy has been consulted for cefepime and vancomycin dosing. ? ?WBC 19.3, afebrile on presentation. Cefepime 2 g IV x1 and vancomycin 1000 mg IV x1 per MD. Maintenance dose as follows: ?Vancomycin 750 mg IV Q 24 hr ?Scr used: 0.8 mg/dL ?Weight: 59.5 kg ?Vd coeff: 0.72 L/kg ?Est AUC: 482.4 ? ?Plan: ?Cefepime 2 g IV q12h  ?Vancomycin 750 mg IV q24h  ?Metronidazole per MD ?F/u clinical course, cultures ? ? ?Height: '5\' 3"'$  (160 cm) ?Weight: 59.5 kg (131 lb 2.8 oz) ?IBW/kg (Calculated) : 52.4 ? ?Temp (24hrs), Avg:97 ?F (36.1 ?C), Min:97 ?F (36.1 ?C), Max:97 ?F (36.1 ?C) ? ?Recent Labs  ?Lab 06/15/2021 ?0036 06/27/2021 ?7408  ?WBC 19.3*  --   ?CREATININE 0.83 0.80  ?LATICACIDVEN 3.8*  --   ?  ?Estimated Creatinine Clearance: 42.5 mL/min (by C-G formula based on SCr of 0.8 mg/dL).   ? ?Allergies  ?Allergen Reactions  ? Penicillins Anaphylaxis  ? Adhesive [Tape] Other (See Comments)  ?  Blisters   ? Doxycycline Nausea And Vomiting  ? Morphine And Related Other (See Comments)  ?  Hallucinations  ? ? ?Antimicrobials this admission: ?Cefepime 4/25 >>  ?Vancomycin 4/25 >>  ?Metronidazole 4/25 >> ? ?Dose adjustments this admission: ?N/A ? ?Microbiology results: ?4/25 BCx: in process ?4/25 UCx: in process  ? ?Thank you for allowing pharmacy to be a part of this patientKelseys care. ? ?Zenaida Deed, PharmD ?PGY1 Acute Care Pharmacy Resident  ?Phone: [phone] ?06/01/2021  9:38 AM ? ?Please check [email or web address] for unit-specific pharmacy phone numbers. ? ?____________________________ ? ?06/28/2021 1:22 PM Addendum: ? ?Patient has moved to comfort measures. All antibiotics discontinued.  ? ?

## 2021-06-23 NOTE — ED Notes (Signed)
Syncope episode noticed after pt was sitting up for brace placement with BP 50/40's ?

## 2021-06-23 NOTE — Progress Notes (Signed)
Brief Palliative Medicine Progress Note: ? ?PMT consult received and chart reviewed. GOC completed with patient and son/Stan - full note to follow: ? ?Recommendations: ?Continue full comfort measures ?Continue DNR/DNI as previously documented ?Will see how patient does overnight and see if residential hospice appropriate tomorrow 4/26 - patient is clear she does not want to pass away at home ?Added orders for EOL symptom management and to reflect full comfort measures, as well as discontinued orders that were not focused on comfort ?Patient would like to continue non-rebreather for now; can wean to [place] per patient comfort - do not escalate/titrate oxygen per saturations - see Oxygen Therapy order for symptom management instructions ?Unrestricted visitation orders were placed per current [place] E[facility] visitation policy  ?Nursing to provide frequent assessments and administer PRN medications as clinically necessary to ensure EOL comfort ?PMT will continue to follow and support holistically ? ?Symptom Management ?Discontinue fentanyl ?Started dilaudid PRN pain/dyspnea/increased work of breathing/RR >25 ?Tylenol PRN pain/fever ?Biotin twice daily ?Benadryl PRN itching ?Robinul PRN secretions ?Haldol PRN agitation/delirium ?Ativan PRN anxiety/seizure/sleep/distress ?Zofran PRN nausea/vomiting ?Liquifilm Tears PRN dry eye ? ?Thank you for allowing PMT to assist in the care of this patient. ? ?[employer] Tamala Julian, FNP-BC ?Palliative Medicine Team ?Team Phone: [phone] ?NO CHARGE ? ?

## 2021-06-23 NOTE — Consult Note (Signed)
?Consultation Note ?Date: 06/17/2021  ? ?Patient Name: [name]  ?DOB: February 18, 1935  MRN: [id number]  Age / Sex: 87 y.o., female  ?PCP: Reynold Bowen, MD ?Referring Physician: Norval Morton, MD ? ?Reason for Consultation: Disposition, Establishing goals of care, Non pain symptom management, Pain control, Psychosocial/spiritual support, and Terminal Care ? ?HPI/Patient Profile: 87 y.o. female  with past medical history of  hypertension, hyperlipidemia, diastolic CHF, severe PAH, DVT/PE on chronic anticoagulation, chronic hypotension on midodrine COPD on 4-5 L of oxygen on chronic steroid,  diabetes mellitus type 2, breast cancer, and prior compression fractures presented to ED on 06/15/2021 from home after fall on blood thinners. Patient was admitted on 06/27/2021 for T5 compression fracture with prevertebral hematoma secondary to fall, SIRS, syncope secondary to acute on chronic hypotension; however, while still in ED patient began to decline and after discussions with admitting provider patient/family opted for transition to full comfort care. ? ?Clinical Assessment and Goals of Care: ?I have reviewed medical records including EPIC notes, labs, and imaging. Received report from primary RN - no acute concerns.  ? ?Went to visit patient at bedside - son/Stan and friend/Lou present. Patient was lying in bed awake, alert, oriented, and able to participate in conversation. No signs or non-verbal gestures of pain or discomfort noted. No respiratory distress, increased work of breathing, or secretions noted. She is on non-rebreather. Endorses generalized pain. ? ?Met with patient, son, friend  to discuss diagnosis, prognosis, GOC, EOL wishes, disposition, and options. ? ?I introduced Palliative Medicine as specialized medical care for people living with serious illness. It focuses on providing relief from the symptoms and stress of a  serious illness. The goal is to improve quality of life for both the patient and the family. ? ?We discussed a brief life review of the patient as well as functional and nutritional status. Prior to hospitalization, patient was living in a private residence with her son as primary caregiver. She ambulated with walker and had several HH services see her including: RN 2x/week, PT/OT, and SW. She and her son describe her appetite as poor at home. She denies nausea. ? ?We discussed patient's current illness and what it means in the larger context of patient's on-going co-morbidities. Patient and son have a clear understanding of her current medical situation. Natural disease trajectory and expectations at EOL were discussed. I attempted to elicit values and goals of care important to the patient. The difference between aggressive medical intervention and comfort care was considered in light of the patient's goals of care. Patient's goal at this time is to be kept comfortable without pursuing life prolonging interventions. ? ?We talked about transition to comfort measures in house and what that would entail inclusive of medications to control pain, dyspnea, agitation, nausea, and itching. We discussed stopping all unnecessary measures such as blood draws, needle sticks, oxygen, antibiotics, CBGs/insulin, cardiac monitoring, IVF, and frequent vital signs. Patient is agreeable and son is supportive. Patient requests to keep oxygen at this time. ? ?Provided education and counseling at length on the philosophy and benefits of hospice care. Discussed that it offers a holistic approach to care in the setting of end-stage illness, and is about supporting the patient where they are allowing nature to take it's course. Discussed the hospice team includes RNs, physicians, social workers, and chaplains. They can provide personal care, support for the family, and help keep patient out of the hospital as well as assist with DME needs  for home hospice.  Education provided on the difference between home vs residential hospice. Patient does not want to pass away at home. Will see how she does overnight. ? ?Visit also consisted of discussions dealing with the complex and emotionally intense issues of symptom management and palliative care in the setting of serious and  life-threatening illness.  ? ?Discussed with patient/family the importance of continued conversation with each other and the medical providers regarding overall plan of care and treatment options, ensuring decisions are within the context of the patient?s values and GOCs.   ? ?Questions and concerns were addressed. The patient/family was encouraged to call with questions and/or concerns. PMT card was provided. ? ? ?Primary Decision Maker: ?PATIENT ?  ? ?Recommendations: ?Continue full comfort measures ?Continue DNR/DNI as previously documented ?Will see how patient does overnight and see if residential hospice appropriate tomorrow 4/26 - patient is clear she does not want to pass away at home ?Added orders for EOL symptom management and to reflect full comfort measures, as well as discontinued orders that were not focused on comfort ?Patient would like to continue non-rebreather for now; can wean to [place] per patient comfort - do not escalate/titrate oxygen per saturations - see Oxygen Therapy order for symptom management instructions ?Unrestricted visitation orders were placed per current [place] EOL visitation policy  ?Nursing to provide frequent assessments and administer PRN medications as clinically necessary to ensure EOL comfort ?PMT will continue to follow and support holistically ?  ?Symptom Management ?Discontinue fentanyl ?Started dilaudid PRN pain/dyspnea/increased work of breathing/RR >25 ?Tylenol PRN pain/fever ?Biotin twice daily ?Benadryl PRN itching ?Robinul PRN secretions ?Haldol PRN agitation/delirium ?Ativan PRN anxiety/seizure/sleep/distress ?Zofran PRN  nausea/vomiting ?Liquifilm Tears PRN dry eye ? ? ?Code Status/Advance Care Planning: ?DNR ? ?Palliative Prophylaxis:  ?Aspiration, Bowel Regimen, Delirium Protocol, Frequent Pain Assessment, Oral Care, and Turn Reposition ? ?Additional Recommendations (Limitations, Scope, Preferences): ?Full Comfort Care ? ?Psycho-social/Spiritual:  ?Desire for further Chaplaincy support:no ?Created space and opportunity for patient and family to express thoughts and feelings regarding patient's current medical situation.  ?Emotional support and therapeutic listening provided. ? ?Prognosis:  ?< 2 weeks ? ?Discharge Planning: To Be Determined  ? ?  ? ?Primary Diagnoses: ?Present on Admission: ? Compression fracture of T5 vertebra (HCC) ? History of pulmonary embolism ? Hypothyroidism ? Osteoporosis ? COPD, severe ([place]) ? Prolonged QT interval ? Heart failure with preserved ejection fraction ([place]) ? Pulmonary artery hypertension ([place]) ? Acute on chronic respiratory failure with hypoxia (HCC) ? Adrenal insufficiency ([place]) ? Anxiety and depression ? Lumbar compression fracture (HCC) ? ? ?I have reviewed the medical record, interviewed the patient and family, and examined the patient. The following aspects are pertinent. ? ?Past Medical History:  ?Diagnosis Date  ? Anemia   ? Asthma   ? CHF (congestive heart failure) ([place])   ? COPD (chronic obstructive pulmonary disease) ([place])   ? Diabetes mellitus without complication ([place])   ? Fibromyalgia   ? History of breast cancer LEFT BREAST DCIS  1996  ? History of DVT (deep vein thrombosis)   ? History of pulmonary embolism   ? Hyperlipidemia   ? Hypertension   ? Hypothyroid   ? IBS (irritable bowel syndrome)   ? Lung mass PER DR CLANCE NOTE UNCLEAR IF LYMPHOMA FROM BX  ? FOLLOWED BY DR Truddie Coco  ? 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  ? Osteoporosis   ? PONV (postoperative nausea and vomiting)   ? Vertebral compression  fracture ([place]) 06/2012  ? L4   ? ?Social History  ? ?Socioeconomic History  ? Marital status: Widowed  ?  Spouse name: Not on file  ? Number of children: Not on file  ? Years of education: Not on file  ? Highest education level: Not on file

## 2021-06-23 NOTE — ED Notes (Signed)
No CT room available at this time. ?

## 2021-06-23 NOTE — Progress Notes (Signed)
Elink is following code sepsis 

## 2021-06-23 NOTE — ED Notes (Signed)
received: 06/16/2021  ? ? ?Test: lactic ?Critical Value: 3.8 ? ?Name of Provider Notified: Dr. Dina Rich ? ? ?

## 2021-06-23 NOTE — Progress Notes (Signed)
Code Sepsis cancelled. Pt will be comfort care. No pressors to be started. ?

## 2021-06-23 NOTE — Progress Notes (Signed)
Chaplain responded to this level II fall on thinners.  Patient came from home and her son, lives with her.  Chaplain provided bedside support as she waited for CT scan.  Faith is extremely important to her and she belongs to [facility].  Patient has a positive outlook and a strong spirit.  Chaplain met son in the waiting room and took him back to be with the patient.  Chaplain available as needed. ?[place], [place]. ? ? ? 05/30/2021 0123  ?Clinical Encounter Type  ?Visited With Patient;Family;Health care provider  ?Visit Type Initial;Spiritual support;Trauma;ED  ?Referral From Nurse  ?Consult/Referral To Chaplain  ? ? ?

## 2021-06-23 NOTE — Plan of Care (Signed)
Patient was noted to have mean arterial blood pressure less than 65 despite IV fluids with fluid boluses.  Was notified by nursing that patient concern for jugular venous distention.  Patient's CODE STATUS of DO NOT RESUSCITATE have been confirmed as well as the patient's wishes of not wanting to undergo intubation.  There was concern for the possibility of patient becoming fluid overloaded with continued IV fluid.  After further talks with the patient's daughter over the phone and her son at bedside they seem to be in agreement for comfort care measures only at this time.  Palliative care had already been consulted.   ?- Palliative care order set initiated ?- Discontinue cardiac monitoring ?- Routine vital sign checks ?- Discontinued home medications ?- Diet as tolerated ?- Okay for RN to pronounce death ?- Aspiration precautions ?- Maintain IV access ?- IV fluids KVO  ?- Continuous nasal cannula oxygen as needed for comfort ?- Fentanyl IV prn pain ?- Zofran IV prn nausea/vomiting ?- Ativan IV prn anxiety ?- Haloperidol IV prn agitation or delirium ?- Glycopyrrolate prn excessive secretions ?- Albuterol prn wheezing   ?- Palliative care consulted, we will follow-up for any further recommendations in regards to management ? ?

## 2021-06-23 NOTE — ED Notes (Signed)
C-spine no cleared by provider yet, but pt turn her self to her side. Pt oriented multiple times to rest on her back until c-spine gets clear, but pt still turning her self to her side. ?

## 2021-06-23 NOTE — ED Notes (Signed)
This RN at bedside to assess pt. Pt noted to be lying in bed on 15L non-rebreather with no distress noted. Per MD, pt to wear non-rebreather for comfort and is to notify RN when she feels comfortable with weaning down to . RN clarified pt understanding of the same. Per order, continuous pulse ox and monitoring equipment removed from pt. Family members remain at bedside.  ?

## 2021-06-23 NOTE — H&P (Addendum)
?History and Physical  ? ? ?PatientMarland Kitchen [name] OIN:[id number] DOB: 06/28/1934 ?DOA: 06/12/2021 ?DOS: the patient was seen and examined on 06/26/2021 ?PCP: Kelsey Bowen, MD  ?Patient coming from: Home via EMS ? ?Chief Complaint:  ?Chief Complaint  ?Patient presents with  ? Fall  ? ?HPI: [name] is a 87 y.o. 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 Review of records shows patient had been admitted in 03/2021 for acute on chronic hypoxic respiratory failure after mechanical fall.  Found to have left upper lobe DVT, 3 left-sided rib fractures, and T3 superior endplate compression fracture started on Eliquis.  Then admitted into the hospital 2/23 due to acute on chronic respiratory failure with hypoxia in the setting of suspected COPD exacerbation with concern for underlying interstitial lung disease.  During the hospitalization she was found to have concern for age-indeterminate DVT and continued on Eliquis.  She has been discharged to rehab prior to being able to go back home.  She was supposed to follow-up with PCCM, but had not been able to follow-up yet.  Son notes that she has had decreased appetite, but was drinking fluids.  She had been using her walker last night when the patient reportedly had an unwitnessed fall where she fell backwards.  Her son heard the thud and came to check on her.  He was able to get her up, but as she was walking with a walker back to her room fell forwards.  She may have hit her head with the falls, but her son was not sure.  He was unable to get her up at that time and called EMS In route with EMS patient had been given 100 mcg of fentanyl and was placed on a nonrebreather.  W she t had  several episodes of nonbloody nonbilious nonbloody emesis as well in route with EMS.  At this time she complains of pain all over.  She reports her breathing is okay currently on nonrebreather and patient has a chronic cough.  Son reports that she had not had any recent fevers, chest pain, leg swelling, blood in stools to his knowledge, or complaints of dysuria.  Son states that she had not been on diuretics although pharmacy made note patient was on hydrochlorothiazide and possibly took the last dose yesterday. ? ?In the emergency department patient was noted to be afebrile, pulse 73-129, respiration 19-29, blood pressures as low as 58/42 after patient syncopized while being sat up off oxygen, and O2 saturations reported as 74% .  Patient was bolused 1 L normal saline fluids with temporary improvement blood pressures and placed on nonrebreather 15 L with O2 saturations maintained greater than 92%.  Found to have new T5 compression fracture with 40% height loss on CT with associated prevertebral hematoma.  Urinalysis noted trace leukocytes, no bacteria, 6-10 WBCs, and elevated specific gravity.  Patient had been given several doses of fentanyl for pain.  Talked with son at bedside he reports that she is to remain DNR.  She has been being followed by [facility] palliative care services. ? ?Review of Systems: As mentioned in the history of present illness. All other systems reviewed and are negative. ?Past Medical History:  ?Diagnosis Date  ? Anemia   ? Asthma   ?  CHF (congestive heart failure) ([place])   ? COPD (chronic obstructive pulmonary disease) ([place])   ? Diabetes mellitus without complication ([place])   ? Fibromyalgia   ? History of breast cancer LEFT BREAST DCIS  1996  ? History of DVT (deep vein thrombosis)   ? History of pulmonary embolism   ? Hyperlipidemia   ? Hypertension   ? IBS (irritable bowel syndrome)   ? Lung mass PER DR CLANCE NOTE UNCLEAR IF LYMPHOMA FROM BX  ? FOLLOWED BY DR Truddie Coco  ? Neuropathy due to  chemotherapeutic drug (HCC) NUMBNESS / TINGLING FEET AND HANDS  ? Osteoporosis   ? PONV (postoperative nausea and vomiting)   ? Vertebral compression fracture ([place]) 06/2012  ? L4  ? ?Past Surgical History:  ?Procedure Laterality Date  ? APPENDECTOMY    ? BILATERAL CARPAL TUNNEL RELEASE    ? BILATERAL LAMINECTOMY/ FORAMINOTOMY  01-14-2004  ? L4 - L5  ? BREAST LUMPECTOMY  1996  ?  LEFT BREAST DCIS   ? BREAST LUMPECTOMY Right 2012  ? radiation and chemo  ? BRONCHOSCOPY  01-29-2010  ? W/ BX  ? CATARACT EXTRACTION W/ INTRAOCULAR LENS  IMPLANT, BILATERAL    ? CHOLECYSTECTOMY    ? COLONOSCOPY    ? COLONOSCOPY WITH PROPOFOL Left 04/16/2014  ? Procedure: COLONOSCOPY WITH PROPOFOL;  Surgeon: Winfield Cunas., MD;  [facility] ENDOSCOPY;  Service: Endoscopy;  Laterality: Left;  ? ESOPHAGOGASTRODUODENOSCOPY N/A 03/11/2013  ? Procedure: ESOPHAGOGASTRODUODENOSCOPY (EGD);  Surgeon: Lear Ng, MD;  Location: [facility] ENDOSCOPY;  Service: Endoscopy;  Laterality: N/A;  ? ESOPHAGOGASTRODUODENOSCOPY (EGD) WITH PROPOFOL Left 04/16/2014  ? Procedure: ESOPHAGOGASTRODUODENOSCOPY (EGD) WITH PROPOFOL;  Surgeon: Winfield Cunas., MD;  Location: [facility] ENDOSCOPY;  Service: Endoscopy;  Laterality: Left;  ? FEMUR FRACTURE SURGERY  12-18-2010  DR Tonita Cong  ? INTRAMEDULLARY NAILING LEFT INTERTROCHANTERIC -SUBTROCHANTERIC FX  ? HARDWARE REMOVAL  10/11/2011  ? Procedure: HARDWARE REMOVAL;  Surgeon: Johnn Hai, MD;  Location: [facility];  Service: Orthopedics;  Laterality: Left;  LEFT THIGH REMOVAL OF DISTAL LOCKING SCREW ?NEEDS: FLORO, RADIO LUSCENT TABLE AND SCREWDRIVER FOR BIOMET ROD ?  ? PLACEMENT PORT- A- CATH  02-18-2010  ? RIGHT BREAST LUMPECTOMY / REMOVAL LYMPH NODES X2/ REMOVAL PAC  10-01-2010  ? CARCINOMA RIGHT BREAST  ? TONSILLECTOMY    ? TOTAL ABDOMINAL HYSTERECTOMY W/ BILATERAL SALPINGOOPHORECTOMY  1977  (APPROX)  ? TRANSTHORACIC ECHOCARDIOGRAM  02-25-2010  ? LVSF NORMAL/ EF 85-63%/ GRADE I DIASTOLIC DYSFUNCTION/  MILDLY DILATED LEFT ATRIUM  ? TVT VAGINAL TAPE  SUBURETHRAL SLING   06-08-2005  ? SUI  ? ?Social History:  reports that she quit smoking about 43 years ago. Her smoking use included cigarettes. She has a 30.00 pack-year smoking history. She has never used smokeless tobacco. She reports current alcohol use of about 7.0 - 10.0 standard drinks per week. She reports that she does not use drugs. ? ?Allergies  ?Allergen Reactions  ? Penicillins Anaphylaxis  ? Adhesive [Tape] Other (See Comments)  ?  Blisters   ? Morphine   ?  Other reaction(s): Other (See Comments) ?HALLUCINATIONS  ? Doxycycline Nausea And Vomiting  ? Morphine And Related Other (See Comments)  ?  HALLUCINATIONS  ? ? ?Family History  ?Problem Relation Age of Onset  ? Emphysema Father   ? CAD Father   ? Ovarian cancer Mother   ? Cancer Mother   ?     cervical  ? Alzheimer's disease Mother   ?  Hyperlipidemia Mother   ? Breast cancer Mother   ? Cancer Sister   ?     cervical  ? Breast cancer Sister   ? Breast cancer Maternal Grandmother   ? Breast cancer Maternal Aunt   ? ? ?Prior to Admission medications   ?Medication Sig Start Date End Date Taking? Authorizing Provider  ?acetaminophen (TYLENOL) 500 MG tablet Take 2 tablets (1,000 mg total) by mouth every 8 (eight) hours. 04/22/21   Ghimire, Henreitta Leber, MD  ?albuterol (PROVENTIL) (2.5 MG/3ML) 0.083% nebulizer solution Take 1 vial (2.'5mg'$ ) by nebulization every 6 (six) hours as needed for wheezing. 04/22/21   Ghimire, Henreitta Leber, MD  ?albuterol (PROVENTIL HFA;VENTOLIN HFA) 108 (90 Base) MCG/ACT inhaler Inhale 2 puffs into the lungs every 6 (six) hours as needed for wheezing or shortness of breath. 03/25/17   Byrum, Rose Fillers, MD  ?apixaban (ELIQUIS) 5 MG TABS tablet Take 1 tablet (5 mg total) by mouth 2 (two) times daily. 03/22/21   Hongalgi, Lenis Dickinson, MD  ?ascorbic acid (VITAMIN C) 500 MG tablet Take 1 tablet (500 mg total) by mouth 2 (two) times daily. 04/22/21   Ghimire, Henreitta Leber, MD  ?feeding supplement (BOOST  HIGH PROTEIN) LIQD Take 1 Container by mouth daily. [place]    [provider]  ?gabapentin (NEURONTIN) 100 MG capsule Take 100 mg by mouth 2 (two) times daily. 8 am and 2 pm    Provider, Historical,

## 2021-06-23 NOTE — ED Triage Notes (Signed)
Level 2 trauma fall on thinners. 100 mcg Fentanyl given by EMS pta. ?

## 2021-06-23 NOTE — ED Notes (Signed)
Complete linen change provided, placed pt on bedpan, peri-care also provided ?

## 2021-06-23 NOTE — Progress Notes (Signed)
Orthopedic Tech Progress Note ?Patient Details:  ?[place] ?22-Feb-1935 ?[id number] ? ?Patient ID: [name], female   DOB: September 04, 1934, 87 y.o.   MRN: [id number] ?I was called to apply a tlso to the patient. I had all the leads removed and had the patient sitting up on the side of the bed in preparation to change her clothes and apply the brace. The RN came in and gave the patient some nausea medicine. After I gave the patient a few minutes to start let the medicine start helping with her nausea. Then the patient became pale and then slumped over and became non responsive. I laid the patient in the bed and put up the rail. I then went and alerted the RN to the situation. They came and took over the situation. I asked the RN to contact me when the patient is ready to try and get up to put the brace on again. ?[name] ?06/26/2021, 5:59 AM ? ?

## 2021-06-23 NOTE — ED Notes (Signed)
Transported to CT 

## 2021-06-23 NOTE — ED Provider Notes (Signed)
?[facility] ?Provider Note ? ? ?CSN: [id number] ?Arrival date & time: 06/07/2021  0020 ? ?  ? ?History ? ?Chief Complaint  ?Patient presents with  ? Fall  ? ? ?[name] is a 87 y.o. female. ? ?HPI ? ?  ? ?This is an 87 year old female with history of DVT and pulmonary embolism on Eliquis, breast cancer, chronic obstructive pulmonary disease on 4 to 5 L of oxygen, diabetes who presents following a fall.  Patient reports tripping and falling at home.  She remembers falling.  Per EMS, she has had multiple episodes of nonbilious, nonbloody emesis in route and has been "in and out of consciousness."  GCS 14 in route.  Patient is able to provide history.  She states she hurts all over.  She is on a nonrebreather upon arrival.  When taken off, O2 sat 74%.  She denies any new shortness of breath or chest pain.  She reports pain in the right knee, head, back. ? ?Home Medications ?Prior to Admission medications   ?Medication Sig Start Date End Date Taking? Authorizing Provider  ?acetaminophen (TYLENOL) 500 MG tablet Take 2 tablets (1,000 mg total) by mouth every 8 (eight) hours. 04/22/21   Ghimire, Henreitta Leber, MD  ?albuterol (PROVENTIL) (2.5 MG/3ML) 0.083% nebulizer solution Take 1 vial (2.'5mg'$ ) by nebulization every 6 (six) hours as needed for wheezing. 04/22/21   Ghimire, Henreitta Leber, MD  ?albuterol (PROVENTIL HFA;VENTOLIN HFA) 108 (90 Base) MCG/ACT inhaler Inhale 2 puffs into the lungs every 6 (six) hours as needed for wheezing or shortness of breath. 03/25/17   Byrum, Rose Fillers, MD  ?apixaban (ELIQUIS) 5 MG TABS tablet Take 1 tablet (5 mg total) by mouth 2 (two) times daily. 03/22/21   Hongalgi, Lenis Dickinson, MD  ?ascorbic acid (VITAMIN C) 500 MG tablet Take 1 tablet (500 mg total) by mouth 2 (two) times daily. 04/22/21   Ghimire, Henreitta Leber, MD  ?feeding supplement (BOOST HIGH PROTEIN) LIQD Take 1 Container by mouth daily. Wild b[place]    [provider]  ?gabapentin (NEURONTIN) 100 MG  capsule Take 100 mg by mouth 2 (two) times daily. 8 am and 2 pm    [provider]  ?gabapentin (NEURONTIN) 300 MG capsule Take 300 mg by mouth at bedtime. 03/20/21   [provider]  ?levothyroxine (SYNTHROID) 100 MCG tablet Take 1 tablet (100 mcg total) by mouth daily before breakfast. For hypothyroid 03/17/21   Hongalgi, Lenis Dickinson, MD  ?lidocaine (LIDODERM) 5 % Place 1 patch onto the skin daily. Remove & Discard patch within 12 hours or as directed by MD. Apply to left upper lateral chest wall at site of maximum pain. 04/22/21   Ghimire, Henreitta Leber, MD  ?LORazepam (ATIVAN) 0.5 MG tablet Take 1 tablet (0.5 mg total) by mouth every 8 (eight) hours as needed for anxiety. Anxiety 03/17/21   Hongalgi, Lenis Dickinson, MD  ?midodrine (PROAMATINE) 5 MG tablet Take 5 mg by mouth 2 (two) times daily. 04/07/21   [provider]  ?Multiple Vitamins-Minerals (CERTAVITE/ANTIOXIDANTS) TABS Take 1 tablet by mouth daily. 04/22/21   Ghimire, Henreitta Leber, MD  ?Nutritional Supplements (,FEEDING SUPPLEMENT, PROSOURCE PLUS) liquid Take 30 mLs by mouth 3 (three) times daily between meals. ?Patient not taking: Reported on 04/19/2021 03/17/21   Modena Jansky, MD  ?OXYGEN Inhale 4 L into the lungs continuous.    [provider]  ?pantoprazole (PROTONIX) 40 MG tablet Take 1 tablet (40 mg total) by mouth daily.  03/17/21   Hongalgi, Lenis Dickinson, MD  ?polyethylene glycol (MIRALAX) 17 g packet Take 17 g by mouth daily. 04/22/21   Ghimire, Henreitta Leber, MD  ?predniSONE (DELTASONE) 10 MG tablet Take 3 tablets (30 mg) by mouth once daily for 5 days, take 2 tabs daily for 5 days, then take 1 & 1/2 tabs (15 mg) daily until seen by your pulmonologist. 04/22/21   Jonetta Osgood, MD  ?simvastatin (ZOCOR) 80 MG tablet Take 80 mg by mouth daily. 03/26/18   [provider]  ?traMADol (ULTRAM) 50 MG tablet Take 1 tablet (50 mg total) by mouth every 6 (six) hours as needed for severe pain. 04/22/21   Jonetta Osgood, MD  ?Donnal Debar 200-62.5-25 MCG/ACT AEPB Take 1 puff by mouth daily. 04/07/21   [provider]  ?cyanocobalamin 1000 MCG tablet Take 1 tablet (1,000 mcg total) by mouth daily. 04/23/21   Ghimire, Henreitta Leber, MD  ?   ? ?Allergies    ?Penicillins, Adhesive [tape], Morphine, Doxycycline, and Morphine and related   ? ?Review of Systems   ?Review of Systems  ?Constitutional:  Negative for fever.  ?Respiratory:  Positive for shortness of breath.   ?Cardiovascular:  Negative for chest pain.  ?Gastrointestinal:  Positive for nausea and vomiting. Negative for abdominal pain.  ?Musculoskeletal:   ?     Knee pain  ?Skin:  Positive for wound.  ?Neurological:  Positive for headaches.  ?All other systems reviewed and are negative. ? ?Physical Exam ?Updated Vital Signs ?BP 101/60   Pulse (!) 123   Temp (!) 97 ?F (36.1 ?C)   Resp (!) 24   Ht 1.6 m ('5\' 3"'$ )   Wt 59.5 kg   SpO2 96%   BMI 23.24 kg/m?  ?Physical Exam ?Vitals and nursing note reviewed.  ?Constitutional:   ?   Appearance: She is well-developed. She is not toxic-appearing.  ?   Comments: Chronically ill-appearing, elderly  ?HENT:  ?   Head: Normocephalic and atraumatic.  ?   Nose: Nose normal.  ?   Mouth/Throat:  ?   Mouth: Mucous membranes are dry.  ?Eyes:  ?   Pupils: Pupils are equal, round, and reactive to light.  ?Neck:  ?   Comments: C-collar in place ?Cardiovascular:  ?   Rate and Rhythm: Regular rhythm. Tachycardia present.  ?   Heart sounds: Normal heart sounds.  ?Pulmonary:  ?   Effort: Respiratory distress present.  ?   Breath sounds: No wheezing.  ?   Comments: Tachypnea, poor air movement, breath sounds bilaterally ?Abdominal:  ?   General: Bowel sounds are normal.  ?   Palpations: Abdomen is soft.  ?   Tenderness: There is no abdominal tenderness.  ?Musculoskeletal:     ?   General: No deformity.  ?   Cervical back: Neck supple.  ?Skin: ?   General: Skin is warm and dry.  ?   Comments: Large skin tear over the right anterior knee, right elbow, left elbow   ?Neurological:  ?   Mental Status: She is alert and oriented to person, place, and time.  ?Psychiatric:  ?   Comments: Anxious appearing  ? ? ?ED Results / Procedures / Treatments   ?Labs ?(all labs ordered are listed, but only abnormal results are displayed) ?Labs Reviewed  ?COMPREHENSIVE METABOLIC PANEL - Abnormal; Notable for the following components:  ?    Result Value  ? CO2 21 (*)   ? Glucose, Bld 116 (*)   ?  Albumin 3.1 (*)   ? AST 45 (*)   ? All other components within normal limits  ?CBC - Abnormal; Notable for the following components:  ? WBC 19.3 (*)   ? HCT 47.2 (*)   ? MCHC 29.9 (*)   ? RDW 19.2 (*)   ? Platelets 417 (*)   ? All other components within normal limits  ?ETHANOL - Abnormal; Notable for the following components:  ? Alcohol, Ethyl (B) 66 (*)   ? All other components within normal limits  ?LACTIC ACID, PLASMA - Abnormal; Notable for the following components:  ? Lactic Acid, Venous 3.8 (*)   ? All other components within normal limits  ?PROTIME-INR - Abnormal; Notable for the following components:  ? Prothrombin Time 15.3 (*)   ? All other components within normal limits  ?I-STAT CHEM 8, ED - Abnormal; Notable for the following components:  ? Sodium 134 (*)   ? Glucose, Bld 116 (*)   ? Calcium, Ion 1.02 (*)   ? Hemoglobin 16.0 (*)   ? HCT 47.0 (*)   ? All other components within normal limits  ?RESP PANEL BY RT-PCR (FLU A&B, COVID) [id number]  ?URINALYSIS, ROUTINE W REFLEX MICROSCOPIC  ?SAMPLE TO BLOOD BANK  ? ? ?EKG ?EKG Interpretation ? ?Date/Time:  Tuesday June 23 2021 05:58:43 EDT ?Ventricular Rate:  73 ?PR Interval:  167 ?QRS Duration: 127 ?QT Interval:  400 ?QTC Calculation: 441 ?R Axis:   129 ?Text Interpretation: Sinus rhythm Right bundle branch block Confirmed by Thayer Jew [phone]) on 06/09/2021 6:19:12 AM ? ?Radiology ?DG Elbow Complete Left ? ?Result Date: 06/15/2021 ?CLINICAL DATA:  Fall EXAM: LEFT ELBOW - COMPLETE 3+ VIEW COMPARISON:  None. FINDINGS: No fracture or dislocation  is seen. The joint spaces are preserved. Visualized soft tissues are within normal limits. IMPRESSION: Negative. Electronically Signed   By: Julian Hy M.D.   On: 06/03/2021 01:02  ? ?DG Elbow Complete

## 2021-06-24 DIAGNOSIS — J9621 Acute and chronic respiratory failure with hypoxia: Secondary | ICD-10-CM | POA: Diagnosis not present

## 2021-06-24 DIAGNOSIS — S22050A Wedge compression fracture of T5-T6 vertebra, initial encounter for closed fracture: Secondary | ICD-10-CM | POA: Diagnosis not present

## 2021-06-24 DIAGNOSIS — E2740 Unspecified adrenocortical insufficiency: Secondary | ICD-10-CM | POA: Diagnosis not present

## 2021-06-24 DIAGNOSIS — J9611 Chronic respiratory failure with hypoxia: Secondary | ICD-10-CM | POA: Diagnosis not present

## 2021-06-24 DIAGNOSIS — Z66 Do not resuscitate: Secondary | ICD-10-CM

## 2021-06-24 DIAGNOSIS — Z789 Other specified health status: Secondary | ICD-10-CM

## 2021-06-24 LAB — BLOOD CULTURE ID PANEL (REFLEXED) - BCID2
A.calcoaceticus-baumannii: NOT DETECTED
Bacteroides fragilis: NOT DETECTED
Candida albicans: NOT DETECTED
Candida auris: NOT DETECTED
Candida glabrata: NOT DETECTED
Candida krusei: NOT DETECTED
Candida parapsilosis: NOT DETECTED
Candida tropicalis: NOT DETECTED
Cryptococcus neoformans/gattii: NOT DETECTED
Enterobacter cloacae complex: NOT DETECTED
Enterobacterales: NOT DETECTED
Enterococcus Faecium: NOT DETECTED
Enterococcus faecalis: DETECTED — AB
Escherichia coli: NOT DETECTED
Haemophilus influenzae: NOT DETECTED
Klebsiella aerogenes: NOT DETECTED
Klebsiella oxytoca: NOT DETECTED
Klebsiella pneumoniae: NOT DETECTED
Listeria monocytogenes: NOT DETECTED
Neisseria meningitidis: NOT DETECTED
Proteus species: NOT DETECTED
Pseudomonas aeruginosa: NOT DETECTED
Salmonella species: NOT DETECTED
Serratia marcescens: NOT DETECTED
Staphylococcus aureus (BCID): NOT DETECTED
Staphylococcus epidermidis: NOT DETECTED
Staphylococcus lugdunensis: NOT DETECTED
Staphylococcus species: DETECTED — AB
Stenotrophomonas maltophilia: NOT DETECTED
Streptococcus agalactiae: NOT DETECTED
Streptococcus pneumoniae: NOT DETECTED
Streptococcus pyogenes: NOT DETECTED
Streptococcus species: NOT DETECTED
Vancomycin resistance: NOT DETECTED

## 2021-06-24 LAB — URINE CULTURE

## 2021-06-24 MED ORDER — HYDROMORPHONE HCL 1 MG/ML IJ SOLN
0.5000 mg | INTRAMUSCULAR | Status: DC | PRN
  Administered 2021-06-24: 1 mg via INTRAVENOUS
  Filled 2021-06-24: qty 1

## 2021-06-26 LAB — CULTURE, BLOOD (ROUTINE X 2)

## 2021-06-28 LAB — CULTURE, BLOOD (ROUTINE X 2)
Culture: NO GROWTH
Special Requests: ADEQUATE

## 2021-06-29 NOTE — Progress Notes (Addendum)
Pt was observed on hand off rounds at change of shift. Went in at [place] to assess VS and pt not breathing. This Haematologist  ?Rn josephine verified time of death at 71. Doc and son notified. ME notified. Post mortem care completed. Body to be taken to morgue for funeral home pick up.  ?

## 2021-06-29 NOTE — Progress Notes (Signed)
? ? ?  OVERNIGHT PROGRESS REPORT ? ?Notified by RN that patient has expired at [facility] ?Patient was DNR/comfort care followed by Hospice ? ?2 RN verified. ? ?Family was available to RN via telephone and is en route to the facility at the time of this note. ? ? ?Gershon Cull MSNA ACNPC-AG ?Acute Care Nurse Practitioner ?[facility] ?[place] ? ?

## 2021-06-29 NOTE — Progress Notes (Signed)
PHARMACY - PHYSICIAN COMMUNICATION ?CRITICAL VALUE ALERT - BLOOD CULTURE IDENTIFICATION (BCID) ? ?[name] is an 87 y.o. female who presented to [facility] on 06/05/2021 with a chief complaint of falls and possible sepsis. Pt has now transitioned to comfort care. ? ?Assessment:  1/2 blood cultures growing Co NS and Enterococcus faecalis ? ?Name of physician (or Provider) Contacted:  ?Dr. Velia Meyer ? ?Current antibiotics: None scheduled at this time--received Vancomycin and Cefepime 4/25 ? ?Changes to prescribed antibiotics recommended:  ?No changes at this time ? ?Results for orders placed or performed during the hospital encounter of 05/30/2021  ?Blood Culture ID Panel (Reflexed) (Collected: 06/27/2021  8:45 AM)  ?Result Value Ref Range  ? Enterococcus faecalis DETECTED (A) NOT DETECTED  ? Enterococcus Faecium NOT DETECTED NOT DETECTED  ? Listeria monocytogenes NOT DETECTED NOT DETECTED  ? Staphylococcus species DETECTED (A) NOT DETECTED  ? Staphylococcus aureus (BCID) NOT DETECTED NOT DETECTED  ? Staphylococcus epidermidis NOT DETECTED NOT DETECTED  ? Staphylococcus lugdunensis NOT DETECTED NOT DETECTED  ? Streptococcus species NOT DETECTED NOT DETECTED  ? Streptococcus agalactiae NOT DETECTED NOT DETECTED  ? Streptococcus pneumoniae NOT DETECTED NOT DETECTED  ? Streptococcus pyogenes NOT DETECTED NOT DETECTED  ? A.calcoaceticus-baumannii NOT DETECTED NOT DETECTED  ? Bacteroides fragilis NOT DETECTED NOT DETECTED  ? Enterobacterales NOT DETECTED NOT DETECTED  ? Enterobacter cloacae complex NOT DETECTED NOT DETECTED  ? Escherichia coli NOT DETECTED NOT DETECTED  ? Klebsiella aerogenes NOT DETECTED NOT DETECTED  ? Klebsiella oxytoca NOT DETECTED NOT DETECTED  ? Klebsiella pneumoniae NOT DETECTED NOT DETECTED  ? Proteus species NOT DETECTED NOT DETECTED  ? Salmonella species NOT DETECTED NOT DETECTED  ? Serratia marcescens NOT DETECTED NOT DETECTED  ? Haemophilus influenzae NOT DETECTED NOT DETECTED  ? Neisseria  meningitidis NOT DETECTED NOT DETECTED  ? Pseudomonas aeruginosa NOT DETECTED NOT DETECTED  ? Stenotrophomonas maltophilia NOT DETECTED NOT DETECTED  ? Candida albicans NOT DETECTED NOT DETECTED  ? Candida auris NOT DETECTED NOT DETECTED  ? Candida glabrata NOT DETECTED NOT DETECTED  ? Candida krusei NOT DETECTED NOT DETECTED  ? Candida parapsilosis NOT DETECTED NOT DETECTED  ? Candida tropicalis NOT DETECTED NOT DETECTED  ? Cryptococcus neoformans/gattii NOT DETECTED NOT DETECTED  ? Vancomycin resistance NOT DETECTED NOT DETECTED  ? ? ?[name], [name] ?06/28/2021  2:05 AM ? ?

## 2021-06-29 NOTE — Death Summary Note (Signed)
?Death Summary  ?[name] [facility] FHL:[id number] DOB: 15-Apr-1934 DOA: 2021/07/14 ? ?PCP: Reynold Bowen, MD ? ?Admit date: 14-Jul-2021 ?Date of Death: 07/15/2021 ?Time of Death: 19:56 ?Notification: Reynold Bowen, MD notified of death of 07-16-2021 ? ? ?History of present illness:  ?87 y.o. 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 Review of records shows patient had been admitted in 03/2021 for acute on chronic hypoxic respiratory failure after mechanical fall.  Found to have left upper lobe DVT, 3 left-sided rib fractures, and T3 superior endplate compression fracture started on Eliquis.  Then admitted into the hospital 2/23 due to acute on chronic respiratory failure with hypoxia in the setting of suspected COPD exacerbation with concern for underlying interstitial lung disease.  During the hospitalization she was found to have concern for age-indeterminate DVT and continued on Eliquis.  She has been discharged to rehab prior to being able to go back home.  She was supposed to follow-up with PCCM, but had not been able to follow-up yet.  Son notes that she has had decreased appetite, but was drinking fluids.  She had been using her walker last night when the patient reportedly had an unwitnessed fall where she fell backwards.  Her son heard the thud and came to check on her.  He was able to get her up, but as she was walking with a walker back to her room fell forwards.  She may have hit her head with the falls. In the ED was found to be profoundly hypoxic, was placed on NRB and admitted to the hospital. After [place] discussions, she was transitioned to comfort measures. She passed away Jul 15, 2021 ? ?Final Diagnoses:  ?SIRS ?Lactic  acidosis ?Syncope due to acute on chronic hypotension ?History of adrenal insufficiency ?Acute on chronic hypoxic respiratory failure ?End-stage COPD ?Interstitial lung disease ?History of DVT and PE on chronic anticoagulation ?Acute on chronic diastolic CHF ?Severe pulmonary hypertension ?Thrombocytosis ?Transaminitis ?HLD ?Osteoporosis ?Hypothyroidism ?Anxiety ?Nausea / vomiting ?Hyponatremia ?Hypomagnesemia ? ? ?The results of significant diagnostics from this hospitalization (including imaging, microbiology, ancillary and laboratory) are listed below for reference.   ? ?Significant Diagnostic Studies: ?DG Elbow Complete Left ? ?Result Date: Jul 14, 2021 ?CLINICAL DATA:  Fall EXAM: LEFT ELBOW - COMPLETE 3+ VIEW COMPARISON:  None. FINDINGS: No fracture or dislocation is seen. The joint spaces are preserved. Visualized soft tissues are within normal limits. IMPRESSION: Negative. Electronically Signed   By: Julian Hy M.D.   On: 2021-07-14 01:02  ? ?DG Elbow Complete Right ? ?Result Date: 2021/07/14 ?CLINICAL DATA:  Fall EXAM: RIGHT ELBOW - COMPLETE 3+ VIEW COMPARISON:  None. FINDINGS: No fracture or dislocation is seen. The joint spaces are preserved. Visualized soft tissues are within normal limits. IMPRESSION: Negative. Electronically Signed   By: Julian Hy M.D.   On: 14-Jul-2021 01:02  ? ?CT HEAD WO CONTRAST ? ?Result Date: Jul 14, 2021 ?CLINICAL DATA:  Fall, on blood thinners EXAM: CT HEAD WITHOUT CONTRAST CT CERVICAL SPINE WITHOUT CONTRAST TECHNIQUE: Multidetector CT imaging of the head and cervical spine was performed following the standard protocol without intravenous contrast. Multiplanar CT image reconstructions of the cervical spine were also generated. RADIATION DOSE REDUCTION: This exam was performed according to the departmental dose-optimization program which includes automated exposure control, adjustment of the mA  and/or kV according to patient size and/or use of iterative reconstruction  technique. COMPARISON:  04/19/2021 FINDINGS: CT HEAD FINDINGS Brain: No evidence of acute infarction, hemorrhage, hydrocephalus, extra-axial collection or mass lesion/mass effect. Mild cortical atrophy. Mild subcortical white matter and periventricular small vessel ischemic changes. Vascular: No hyperdense vessel or unexpected calcification. Skull: Normal. Negative for fracture or focal lesion. Sinuses/Orbits: The visualized paranasal sinuses are essentially clear. The mastoid air cells are unopacified. Other: None. CT CERVICAL SPINE FINDINGS Alignment: Normal cervical lordosis. Skull base and vertebrae: No acute fracture. No primary bone lesion or focal pathologic process. Soft tissues and spinal canal: No prevertebral fluid or swelling. No visible canal hematoma. Disc levels: Mild degenerative changes of the mid cervical spine. Spinal canal is patent. Upper chest: Evaluated on dedicated CT chest Other: None. IMPRESSION: No evidence of acute intracranial abnormality. Mild atrophy with small vessel ischemic changes. No evidence of traumatic injury to the cervical spine. Mild degenerative changes. Electronically Signed   By: Julian Hy M.D.   On: 06/07/2021 01:40  ? ?CT CERVICAL SPINE WO CONTRAST ? ?Result Date: 06/26/2021 ?CLINICAL DATA:  Fall, on blood thinners EXAM: CT HEAD WITHOUT CONTRAST CT CERVICAL SPINE WITHOUT CONTRAST TECHNIQUE: Multidetector CT imaging of the head and cervical spine was performed following the standard protocol without intravenous contrast. Multiplanar CT image reconstructions of the cervical spine were also generated. RADIATION DOSE REDUCTION: This exam was performed according to the departmental dose-optimization program which includes automated exposure control, adjustment of the mA and/or kV according to patient size and/or use of iterative reconstruction technique. COMPARISON:  04/19/2021 FINDINGS: CT HEAD FINDINGS Brain: No evidence of acute infarction, hemorrhage,  hydrocephalus, extra-axial collection or mass lesion/mass effect. Mild cortical atrophy. Mild subcortical white matter and periventricular small vessel ischemic changes. Vascular: No hyperdense vessel or unexpected calcification. Skull: Normal. Negative for fracture or focal lesion. Sinuses/Orbits: The visualized paranasal sinuses are essentially clear. The mastoid air cells are unopacified. Other: None. CT CERVICAL SPINE FINDINGS Alignment: Normal cervical lordosis. Skull base and vertebrae: No acute fracture. No primary bone lesion or focal pathologic process. Soft tissues and spinal canal: No prevertebral fluid or swelling. No visible canal hematoma. Disc levels: Mild degenerative changes of the mid cervical spine. Spinal canal is patent. Upper chest: Evaluated on dedicated CT chest Other: None. IMPRESSION: No evidence of acute intracranial abnormality. Mild atrophy with small vessel ischemic changes. No evidence of traumatic injury to the cervical spine. Mild degenerative changes. Electronically Signed   By: Julian Hy M.D.   On: 06/18/2021 01:40  ? ?DG Pelvis Portable ? ?Result Date: 06/02/2021 ?CLINICAL DATA:  Level 2 trauma, fall. EXAM: PORTABLE PELVIS 1-2 VIEWS COMPARISON:  04/19/2021 FINDINGS: There is no evidence of pelvic fracture or diastasis. A compression fracture is noted L5 with degenerative changes in the lower lumbar spine. No dislocation at the hips. Degenerative changes are noted at the hips bilaterally. Fixation hardware is present at the left hip without evidence of hardware loosening. IMPRESSION: 1. No acute fracture. 2. Compression deformity at L5 with degenerative changes, unchanged from the previous exam. Electronically Signed   By: Brett Fairy M.D.   On: 06/10/2021 00:46  ? ?CT CHEST ABDOMEN PELVIS W CONTRAST ? ?Result Date: 06/12/2021 ?CLINICAL DATA:  Level 2 trauma, fall, on blood thinners EXAM: CT CHEST, ABDOMEN, AND PELVIS WITH CONTRAST TECHNIQUE: Multidetector CT imaging of the  chest, abdomen and pelvis was performed following the standard protocol during bolus administration of intravenous contrast. RADIATION DOSE REDUCTION: This exam  was performed according to the departmental dose-optimization p

## 2021-06-29 NOTE — Progress Notes (Addendum)
?                                                                                                                                                     ?                                                   ?Daily Progress Note  ? ?Patient Name: [name] [facility]       Date: 07-10-2021 ?DOB: Nov 13, 1934  Age: 87 y.o. MRN#: [id number] ?Attending Physician: Caren Griffins, MD ?Primary Care Physician: Reynold Bowen, MD ?Admit Date: 05/31/2021 ? ?Reason for Consultation/Follow-up: Disposition, Establishing goals of care, Non pain symptom management, Pain control, Psychosocial/spiritual support, and Terminal Care ? ?Subjective: ?Chart review performed.  Received report from primary RN -no acute concerns.  Reports at start of shift patient was struggling to breathe -administered dose of Dilaudid and Ativan with improvement in symptoms. ? ?Went to visit patient at bedside -no family/visitors present.  Patient was lying in bed -she does not wake to voice/gentle touch.  She remains chronically ill and frail appearing.  No signs or non-verbal gestures of pain or discomfort noted. No respiratory distress, increased work of breathing, or secretions noted.  She is still on nonrebreather -can wean to nasal cannula now that symptoms are better managed. ? ?Discussed with RN oxygen and symptom management plan. ? ?Called son/Stan -emotional support provided.  Provided updates per my and RN assessment today.  Prognostication reviewed per his request.  Discussed option of transfer to residential hospice facility versus home hospice - Cherlynn Kaiser would like to support patient's wish to not pass away at home and would prefer [facility], but before referral is sent he would like to tour facility. Informed Cherlynn Kaiser that I would notify our transitions of care team along with hospice liaison.  Therapeutic listening and emotional support provided as Cherlynn Kaiser reflects on patient recently expressing that she "does not want to be here (on earth) anymore" and that  she is "ready to go." ? ?All questions and concerns addressed. Encouraged to call with questions and/or concerns. PMT card previously provided. ? ?Length of Stay: 1 ? ?Current Medications: ?Scheduled Meds:  ? antiseptic oral rinse  15 mL Topical BID  ? ? ?Continuous Infusions: ? ? ?PRN Meds: ?acetaminophen **OR** acetaminophen, albuterol, diphenhydrAMINE, glycopyrrolate **OR** glycopyrrolate **OR** glycopyrrolate, haloperidol **OR** haloperidol **OR** haloperidol lactate, HYDROmorphone (DILAUDID) injection, LORazepam **OR** LORazepam **OR** LORazepam, ondansetron **OR** ondansetron (ZOFRAN) IV, polyvinyl alcohol ? ?Physical Exam ?Vitals and nursing note reviewed.  ?Constitutional:   ?   General: She is not in acute distress. ?   Appearance: She is ill-appearing.  ?Pulmonary:  ?   Effort: No respiratory  distress.  ?Skin: ?   General: Skin is warm and dry.  ?Neurological:  ?   Mental Status: She is unresponsive.  ?   Motor: Weakness present.  ?Psychiatric:     ?   Speech: She is noncommunicative.  ?         ? ?Vital Signs: BP 119/69 (BP Location: Left Arm)   Pulse 88   Temp 97.6 ?F (36.4 ?C) (Axillary)   Resp 18   Ht '5\' 3"'$  (1.6 m)   Wt 57.8 kg   SpO2 96%   BMI 22.57 kg/m?  ?SpO2: SpO2: 96 % ?O2 Device: O2 Device: NRB ?O2 Flow Rate: O2 Flow Rate (L/min): 15 L/min ? ?Intake/output summary:  ?Intake/Output Summary (Last 24 hours) at 07/02/2021 0816 ?Last data filed at 06/27/2021 1248 ?Gross per 24 hour  ?Intake 2191.69 ml  ?Output --  ?Net 2191.69 ml  ? ?LBM: Last BM Date :  (unsure) ?Baseline Weight: Weight: 59.5 kg ?Most recent weight: Weight: 57.8 kg ? ?     ?Palliative Assessment/Data: PPS 10% ? ? ? ?Flowsheet Rows   ? ?Flowsheet Row Most Recent Value  ?Intake Tab   ?Referral Department Hospitalist  ?Unit at Time of Referral ER  ?Palliative Care Primary Diagnosis Trauma  ?Date Notified 06/07/2021  ?Palliative Care Type New Palliative care  ?Reason for referral Clarify Goals of Care  ?Date of Admission 06/09/2021   ?Date first seen by Palliative Care 05/31/2021  ?# of days Palliative referral response time 0 Day(s)  ?# of days IP prior to Palliative referral 0  ?Clinical Assessment   ?Psychosocial & Spiritual Assessment   ?Palliative Care Outcomes   ?Patient/Family meeting held? Yes  ?Who was at the meeting? patient, son  ?Palliative Care Outcomes Improved pain interventions, Improved non-pain symptom therapy, Clarified goals of care, Counseled regarding hospice, Provided end of life care assistance, Provided advance care planning, Provided psychosocial or spiritual support, Transitioned to hospice  ?Patient/Family wishes: Interventions discontinued/not started  Mechanical Ventilation, Antibiotics, Tube feedings/TPN, BiPAP, Hemodialysis, NIPPV, Trach, Transfusion, Vasopressors, PEG, Transfer out of ICU  ? ?  ? ? ?Patient Active Problem List  ? Diagnosis Date Noted  ? Compression fracture of T5 vertebra ([place]) 06/14/2021  ? Prolonged QT interval 06/18/2021  ? Heart failure with preserved ejection fraction ([place]) 06/08/2021  ? Pulmonary artery hypertension ([place]) 06/10/2021  ? Lumbar compression fracture ([place]) 06/05/2021  ? Palliative care encounter 05/06/2021  ? Chronic respiratory failure with hypoxia, on home O2 therapy ([place]) 05/06/2021  ? History of pulmonary embolism 04/20/2021  ? Physical deconditioning 04/20/2021  ? Elevated troponin 04/20/2021  ? Diabetic neuropathy ([place]) 04/20/2021  ? Acute on chronic respiratory failure with hypoxia ([place]) 03/13/2021  ? Rib fractures 03/13/2021  ? COPD, severe ([place]) 03/13/2021  ? Diabetes mellitus type 2, noninsulin dependent ([place]) 03/13/2021  ? Pulmonary embolism ([place]) 03/13/2021  ? Essential hypertension 03/13/2021  ? Sepsis ([place]) 04/10/2014  ? History of DVT (deep vein thrombosis)   ? Blood poisoning   ? Chronic venous insufficiency 02/15/2014  ? Paroxysmal SVT (supraventricular tachycardia) ([place]) 09/11/2013  ? Heart palpitations 08/22/2013  ? H. pylori infection 05/04/2013  ? DVT, LLE  04/18/2013  ? Anxiety and depression 04/18/2013  ? Hypothyroidism 04/18/2013  ? Cellulitis 04/10/2013  ? UTI (urinary tract infection) 04/10/2013  ? Hyponatremia 04/10/2013  ? Hypokalemia 04/10/2013  ? Leg edema, left 04/10/2013  ? Nausea with vomiting 04/10/2013  ? Adult failure to thrive 04/10/2013  ? Protein-calorie malnutrition, severe ([place]) 04/10/2013  ?  Renal insufficiency 03/10/2013  ? GI bleed 03/10/2013  ? UGIB (upper gastrointestinal bleed) 03/10/2013  ? Breast cancer of upper-outer quadrant of right female breast ([place]) 01/04/2013  ? Neoplasm of left breast, primary tumor staging category Tis: ductal carcinoma in situ (DCIS) 01/04/2013  ? Vertebral compression fracture ([place]) 10/30/2012  ? Adrenal insufficiency ([place]) 10/30/2012  ? Osteoporosis 10/30/2012  ? Irritable bowel syndrome 10/30/2012  ? Peripheral neuropathy 10/30/2012  ? Unsteady gait 10/30/2012  ? Recurrent falls 10/30/2012  ? Chronic cough 07/18/2012  ? Snoring 06/14/2012  ? Thin skin   ? Ecchymosis   ? Seasonal allergies   ? Frequency of urination   ? Nocturia   ? Anemia   ? Unspecified hereditary and idiopathic peripheral neuropathy   ? Acute blood loss anemia 01/02/2011  ? Low blood pressure 01/02/2011  ? Low serum cortisol level 01/02/2011  ? Dyslipidemia 01/23/2010  ? Obstructive sleep apnea 01/23/2010  ? MASS, LUNG 01/23/2010  ? ? ?Palliative Care Assessment & Plan  ? ?Patient Profile: ?87 y.o. female  with past medical history of  hypertension, hyperlipidemia, diastolic CHF, severe PAH, DVT/PE on chronic anticoagulation, chronic hypotension on midodrine COPD on 4-5 L of oxygen on chronic steroid,  diabetes mellitus type 2, breast cancer, and prior compression fractures presented to ED on 06/20/2021 from home after fall on blood thinners. Patient was admitted on 05/30/2021 for T5 compression fracture with prevertebral hematoma secondary to fall, SIRS, syncope secondary to acute on chronic hypotension; however, while still in ED patient began  to decline and after discussions with admitting provider patient/family opted for transition to full comfort care. ? ?Assessment: ?Principal Problem: ?  Compression fracture of T5 vertebra (HCC) ?Active Problems

## 2021-06-29 NOTE — Progress Notes (Signed)
?PROGRESS NOTE ? ?[name] [facility] SHF:[id number] DOB: 21-Oct-1934 DOA: 06/06/2021 ?PCP: Reynold Bowen, MD ? ? LOS: 1 day  ? ?Brief Narrative / Interim history: ?87 y.o. 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 Review of records shows patient had been admitted in 03/2021 for acute on chronic hypoxic respiratory failure after mechanical fall.  Found to have left upper lobe DVT, 3 left-sided rib fractures, and T3 superior endplate compression fracture started on Eliquis.  Then admitted into the hospital 2/23 due to acute on chronic respiratory failure with hypoxia in the setting of suspected COPD exacerbation with concern for underlying interstitial lung disease.  During the hospitalization she was found to have concern for age-indeterminate DVT and continued on Eliquis.  She has been discharged to rehab prior to being able to go back home.  She was supposed to follow-up with PCCM, but had not been able to follow-up yet.  Son notes that she has had decreased appetite, but was drinking fluids.  She had been using her walker last night when the patient reportedly had an unwitnessed fall where she fell backwards.  Her son heard the thud and came to check on her.  He was able to get her up, but as she was walking with a walker back to her room fell forwards.  She may have hit her head with the falls. In the ED was found to be profoundly hypoxic, was placed on NRB and admitted to the hospital. After [place] discussions, she was transitioned to comfort measures ? ?Subjective / 24h Interval events: ?Responds when prompted, complains of pain. Drifts asleep ? ?Assesement and Plan: ?Principal Problem: ?  Compression fracture of T5 vertebra (HCC) ?Active  Problems: ?  Adrenal insufficiency ([place]) ?  Osteoporosis ?  Anxiety and depression ?  Hypothyroidism ?  History of DVT (deep vein thrombosis) ?  Acute on chronic respiratory failure with hypoxia (HCC) ?  COPD, severe ([place]) ?  History of pulmonary embolism ?  Prolonged QT interval ?  Heart failure with preserved ejection fraction ([place]) ?  Pulmonary artery hypertension ([place]) ?  Lumbar compression fracture (HCC) ? ?Principal problem ?[place] - patient with multiple medical problems, presenting to the hospital with recurrent falls, FTT, worsening hypoxia, severe pain, SIRS / hypotension. Palliative consulted, after discussions will transition to comfort measures. To go to [facility] one bed is available ? ?Active problems ?SIRS / lactic acidosis ?Syncope secondary to acute on chronic hypotension  ?History of adrenal insufficiency ?Acute on chronic respiratory failure with hypoxia ?Severe COPD ?History of DVT / PE on chronic anticoagulation ?Acute on chronic diastolic CHF ?Severe pulmonary hypertension ?Thrombocytosis ?Transaminitis ?HLD ?Osteoporosis ?Hypothyroidism ?Anxiety ?Nausea / vomiting ?Hyponatremia ?Hypomagnesemia ? ?Scheduled Meds: ? antiseptic oral rinse  15 mL Topical BID  ? ?Continuous Infusions: ?PRN Meds:.acetaminophen **OR** acetaminophen, albuterol, diphenhydrAMINE, glycopyrrolate **OR** glycopyrrolate **OR** glycopyrrolate, haloperidol **OR** haloperidol **OR** haloperidol lactate, HYDROmorphone (DILAUDID) injection, LORazepam **OR** LORazepam **OR** LORazepam, ondansetron **OR** ondansetron (ZOFRAN) IV, polyvinyl alcohol ? ?Diet Orders (From admission, onward)  ? ?  Start     Ordered  ? 06/28/2021 1902  Diet regular Room service appropriate? Yes; Fluid consistency: Thin  Diet effective now       ?Comments: May eat/drink as desires for EOL - careful hand feed  ?  Question Answer Comment  ?Room service appropriate? Yes   ?Fluid consistency: Thin   ?  ? 05/30/2021 1902  ? ?  ?  ? ?  ? ? ?DVT  prophylaxis:  ? ? ?Lab Results  ?Component Value Date  ? PLT 417 (H) 06/02/2021  ? ? ?  Code Status: DNR ? ?Status is: Inpatient ?Remains inpatient appropriate because: bed ? ? ?Level of care: Palliative Care ? ?Consultants:  ?Palliative  ? ? ?Objective: ?Vitals:  ? 06/22/2021 2138 06/16/2021 2139 06/21/2021 2211 07/09/21 0500  ?BP: 94/66  112/76 119/69  ?Pulse: 87 86 82 88  ?Resp:   19 18  ?Temp:   97.8 ?F (36.6 ?C) 97.6 ?F (36.4 ?C)  ?TempSrc:   Axillary Axillary  ?SpO2:  90% 100% 96%  ?Weight:    57.8 kg  ?Height:      ? ?No intake or output data in the 24 hours ending 2021/07/09 1511 ?Wt Readings from Last 3 Encounters:  ?07-09-21 57.8 kg  ?04/20/21 59.5 kg  ?03/12/21 59.4 kg  ? ? ?Examination: ? ?Constitutional: NAD ? ? ?Data Reviewed: I have independently reviewed following labs and imaging studies  ? ?CBC ?Recent Labs  ?Lab 06/15/2021 ?0036 06/07/2021 ?5102  ?WBC 19.3*  --   ?HGB 14.1 16.0*  ?HCT 47.2* 47.0*  ?PLT 417*  --   ?MCV 94.0  --   ?MCH 28.1  --   ?MCHC 29.9*  --   ?RDW 19.2*  --   ? ? ?Recent Labs  ?Lab 06/04/2021 ?0036 06/14/2021 ?5852 06/12/2021 ?0912  ?NA 135 134*  --   ?K 3.8 3.6  --   ?CL 100 98  --   ?CO2 21*  --   --   ?GLUCOSE 116* 116*  --   ?BUN 16 19  --   ?CREATININE 0.83 0.80  --   ?CALCIUM 8.9  --   --   ?AST 45*  --   --   ?ALT 31  --   --   ?ALKPHOS 82  --   --   ?BILITOT 0.5  --   --   ?ALBUMIN 3.1*  --   --   ?MG  --   --  1.5*  ?LATICACIDVEN 3.8*  --  3.5*  ?INR 1.2  --   --   ?TSH  --   --  0.573  ? ? ?------------------------------------------------------------------------------------------------------------------ ?No results for input(s): CHOL, HDL, LDLCALC, TRIG, CHOLHDL, LDLDIRECT in the last 72 hours. ? ?Lab Results  ?Component Value Date  ? HGBA1C 5.2 04/20/2021  ? ?------------------------------------------------------------------------------------------------------------------ ?Recent Labs  ?  06/19/2021 ?0912  ?TSH 0.573  ? ? ?Cardiac Enzymes ?No results for input(s): CKMB, TROPONINI,  MYOGLOBIN in the last 168 hours. ? ?Invalid input(s): CK ?------------------------------------------------------------------------------------------------------------------ ?   ?Component Value Date/Time  ? BNP 545.1 (H) 04/19/2021 2101  ? ? ?CBG: ?No results for input(s): GLUCAP in the last 168 hours. ? ?Recent Results (from the past 240 hour(s))  ?Resp Panel by RT-PCR (Flu A&B, Covid) Nasopharyngeal Swab     Status: None  ? Collection Time: 06/08/2021 12:35 AM  ? Specimen: Nasopharyngeal Swab; Nasopharyngeal(NP) swabs in vial transport medium  ?Result Value Ref Range Status  ? SARS Coronavirus 2 by RT PCR NEGATIVE NEGATIVE Final  ?  Comment: (NOTE) ?SARS-CoV-2 target nucleic acids are NOT DETECTED. ? ?The SARS-CoV-2 RNA is generally detectable in upper respiratory ?specimens during the acute phase of infection. The lowest ?concentration of SARS-CoV-2 viral copies this assay can detect is ?  138 copies/mL. A negative result does not preclude SARS-Cov-2 ?infection and should not be used as the sole basis for treatment or ?other patient management decisions. A negative result may occur with  ?improper specimen collection/handling, submission of specimen other ?than nasopharyngeal swab, presence of viral mutation(s) within the ?areas targeted by this assay, and inadequate number of viral ?copies(<138 copies/mL). A negative result must be combined with ?clinical observations, patient history, and epidemiological ?information. The expected result is Negative. ? ?Fact Sheet for Patients:  ?EntrepreneurPulse.com.au ? ?Fact Sheet for Healthcare Providers:  ?IncredibleEmployment.be ? ?This test is no t yet approved or cleared by the [place] FDA and  ?has been authorized for detection and/or diagnosis of SARS-CoV-2 by ?FDA under an Emergency Use Authorization (EUA). This EUA will remain  ?in effect (meaning this test can be used) for the duration of the ?COVID-19 declaration under Section  564(b)(1) of the Act, 21 ?U.S.C.section 360bbb-3(b)(1), unless the authorization is terminated  ?or revoked sooner.  ? ? ?  ? Influenza A by PCR NEGATIVE NEGATIVE Final  ? Influenza B by PCR NEGATIVE NEGATIVE

## 2021-06-29 NOTE — Progress Notes (Addendum)
Manufacturing engineer [facility]) Hospital Liaison Note ? ?Referral for patient/family interest in [facility]. Chart under review by [facility] physician.  ? ?Hospice eligibility confirmed. ? ?Unfortunately, [facility] is unable to offer a room today. Hospital Liaison will follow up tomorrow or sooner if a room becomes available.   ? ?Please call with any questions or concerns. Thank you ? ?Roselee Nova, LCSW ?[facility] Liaison ?[phone] ?

## 2021-06-29 NOTE — TOC CAGE-AID Note (Addendum)
Transition of Care (TOC) - CAGE-AID Screening ? ? ?Patient Details  ?Name: [name] ?MRN: [id number] ?Date of Birth: 05-10-1934 ? ?Transition of Care (TOC) CM/SW Contact:    ?Kaden Daughdrill C Tarpley-Carter, LCSWA ?Phone Number: ?07-05-2021, 1:12 PM ? ? ?Clinical Narrative: ?Pt is unable to participate in Cage Aid. ? ?Passenger transport manager, MSW, LCSW-A ?Pronouns:  She/Her/Hers ?[facility] Health[facility]itions of Care ?Clinical Social Worker ?Direct Number:  [phone] ?[email or web address] ? ?CAGE-AID Screening: ?Substance Abuse Screening unable to be completed due to: : Patient unable to participate ? ?  ?  ?  ?  ?  ? ?Substance Abuse Education Offered: No ? ?  ? ? ? ? ? ? ?

## 2021-06-29 NOTE — TOC Progression Note (Addendum)
Transition of Care (TOC) - Progression Note  ? ? ?Patient Details  ?Name: TIMARA LOMA ?MRN: [id number] ?Date of Birth: 1934-11-08 ? ?Transition of Care (TOC) CM/SW Contact  ?Marilu Favre, RN ?Phone Number: ?07/10/2021, 8:41 AM ? ?Clinical Narrative:    ? ?Patient from home with son, Alvis Lemmings and Lonia Chimera for palliative care.  ? ?TOC Consult for comfort care. Also consult for palliative care team to see. Will await recommendations and orders  ? ?Cory with [facility] aware patient has been admitted . Abundio Miu with  [facility]  aware of patient's admission  ? ? ?If patient returns to home with home health, Alvis Lemmings will need orders and face to face for HHRN,PT,OT to continue care  ? ? Consult for [employer]. Son Cherlynn Kaiser not at bedside, left message for Cherlynn Kaiser at [phone] await call back. ? ?1030 Stan returned call . He does prefer [employer] , he does want to tour first his cell is [phone]. Referral made to Audrea Muscat with [facility]  ?  ?[place] updated  ?Expected Discharge Plan and Services ?  ?  ?  ?  ?  ?                ?  ?  ?  ?  ?  ?  ?  ?  ?  ?  ? ? ?Social Determinants of Health (SDOH) Interventions ?  ? ?Readmission Risk Interventions ?   ? View : No data to display.  ?  ?  ?  ? ? ?

## 2021-06-29 DEATH — deceased
